# Patient Record
Sex: Female | Born: 1950
Health system: Southern US, Community
[De-identification: ages and names within clinical notes are randomized; demographics above are authoritative.]

## PROBLEM LIST (undated history)

## (undated) DIAGNOSIS — I2510 Atherosclerotic heart disease of native coronary artery without angina pectoris: Secondary | ICD-10-CM

## (undated) DIAGNOSIS — M549 Dorsalgia, unspecified: Secondary | ICD-10-CM

## (undated) DIAGNOSIS — Z8489 Family history of other specified conditions: Secondary | ICD-10-CM

## (undated) DIAGNOSIS — I700 Atherosclerosis of aorta: Secondary | ICD-10-CM

## (undated) DIAGNOSIS — I469 Cardiac arrest, cause unspecified: Secondary | ICD-10-CM

## (undated) DIAGNOSIS — M1990 Unspecified osteoarthritis, unspecified site: Secondary | ICD-10-CM

## (undated) DIAGNOSIS — E785 Hyperlipidemia, unspecified: Secondary | ICD-10-CM

## (undated) DIAGNOSIS — J309 Allergic rhinitis, unspecified: Secondary | ICD-10-CM

## (undated) DIAGNOSIS — I2699 Other pulmonary embolism without acute cor pulmonale: Secondary | ICD-10-CM

## (undated) DIAGNOSIS — Z8619 Personal history of other infectious and parasitic diseases: Secondary | ICD-10-CM

## (undated) DIAGNOSIS — M899 Disorder of bone, unspecified: Secondary | ICD-10-CM

## (undated) DIAGNOSIS — K219 Gastro-esophageal reflux disease without esophagitis: Secondary | ICD-10-CM

## (undated) DIAGNOSIS — R079 Chest pain, unspecified: Secondary | ICD-10-CM

## (undated) DIAGNOSIS — J45901 Unspecified asthma with (acute) exacerbation: Secondary | ICD-10-CM

## (undated) DIAGNOSIS — J45909 Unspecified asthma, uncomplicated: Secondary | ICD-10-CM

## (undated) DIAGNOSIS — I10 Essential (primary) hypertension: Secondary | ICD-10-CM

## (undated) DIAGNOSIS — E1165 Type 2 diabetes mellitus with hyperglycemia: Secondary | ICD-10-CM

## (undated) DIAGNOSIS — M949 Disorder of cartilage, unspecified: Secondary | ICD-10-CM

## (undated) HISTORY — DX: Disorder of cartilage, unspecified: M94.9

## (undated) HISTORY — DX: Atherosclerotic heart disease of native coronary artery without angina pectoris: I25.10

## (undated) HISTORY — DX: Allergic rhinitis, unspecified: J30.9

## (undated) HISTORY — DX: Atherosclerosis of aorta: I70.0

## (undated) HISTORY — DX: Chest pain, unspecified: R07.9

## (undated) HISTORY — DX: Cardiac arrest, cause unspecified: I46.9

## (undated) HISTORY — DX: Unspecified asthma, uncomplicated: J45.909

## (undated) HISTORY — DX: Hyperlipidemia, unspecified: E78.5

## (undated) HISTORY — DX: Dorsalgia, unspecified: M54.9

## (undated) HISTORY — DX: Essential (primary) hypertension: I10

## (undated) HISTORY — DX: Unspecified asthma with (acute) exacerbation: J45.901

## (undated) HISTORY — PX: COLONOSCOPY: SHX174

## (undated) HISTORY — DX: Type 2 diabetes mellitus with hyperglycemia: E11.65

## (undated) HISTORY — DX: Disorder of bone, unspecified: M89.9

## (undated) HISTORY — DX: Other pulmonary embolism without acute cor pulmonale: I26.99

---
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
## 2001-04-26 ENCOUNTER — Encounter: Payer: Self-pay | Admitting: Internal Medicine

## 2001-10-30 ENCOUNTER — Emergency Department (HOSPITAL_COMMUNITY): Admission: EM | Admit: 2001-10-30 | Discharge: 2001-10-30 | Payer: Self-pay

## 2003-08-25 ENCOUNTER — Emergency Department (HOSPITAL_COMMUNITY): Admission: EM | Admit: 2003-08-25 | Discharge: 2003-08-25 | Payer: Self-pay | Admitting: Emergency Medicine

## 2005-04-28 ENCOUNTER — Ambulatory Visit: Payer: Self-pay | Admitting: Family Medicine

## 2005-05-06 ENCOUNTER — Ambulatory Visit: Payer: Self-pay | Admitting: *Deleted

## 2005-05-31 ENCOUNTER — Ambulatory Visit: Payer: Self-pay | Admitting: Family Medicine

## 2005-07-13 ENCOUNTER — Ambulatory Visit: Payer: Self-pay | Admitting: Family Medicine

## 2006-10-04 ENCOUNTER — Ambulatory Visit: Payer: Self-pay | Admitting: Internal Medicine

## 2006-10-04 LAB — CONVERTED CEMR LAB
Alkaline Phosphatase: 87 units/L (ref 39–117)
BUN: 11 mg/dL (ref 6–23)
Basophils Absolute: 0 10*3/uL (ref 0.0–0.1)
CO2: 31 meq/L (ref 19–32)
Calcium: 9.5 mg/dL (ref 8.4–10.5)
Chloride: 103 meq/L (ref 96–112)
Cholesterol: 231 mg/dL (ref 0–200)
Direct LDL: 149.6 mg/dL
Eosinophils Absolute: 0.1 10*3/uL (ref 0.0–0.6)
Eosinophils Relative: 3 % (ref 0.0–5.0)
GFR calc non Af Amer: 79 mL/min
Glucose, Bld: 99 mg/dL (ref 70–99)
HCT: 38.2 % (ref 36.0–46.0)
HDL: 46.5 mg/dL (ref >39.0)
Hemoglobin, Urine: NEGATIVE
Hemoglobin: 12.1 g/dL (ref 12.0–15.0)
Lymphocytes Relative: 41.2 % (ref 12.0–46.0)
MCV: 74.8 fL — ABNORMAL LOW (ref 78.0–100.0)
Neutrophils Relative %: 46.2 % (ref 43.0–77.0)
Potassium: 3.8 meq/L (ref 3.5–5.1)
RDW: 14.8 % — ABNORMAL HIGH (ref 11.5–14.6)
Specific Gravity, Urine: 1.02 (ref 1.000–1.03)
Total CHOL/HDL Ratio: 5
Triglycerides: 117 mg/dL (ref 0–149)
Urobilinogen, UA: 0.2 (ref 0.0–1.0)
VLDL: 23 mg/dL (ref 0–40)
WBC: 3.6 10*3/uL — ABNORMAL LOW (ref 4.5–10.5)
pH: 6.5 (ref 5.0–8.0)

## 2006-10-05 ENCOUNTER — Ambulatory Visit: Payer: Self-pay | Admitting: Internal Medicine

## 2006-10-26 ENCOUNTER — Ambulatory Visit: Payer: Self-pay | Admitting: Internal Medicine

## 2006-11-01 ENCOUNTER — Ambulatory Visit: Payer: Self-pay | Admitting: Gastroenterology

## 2006-11-03 ENCOUNTER — Ambulatory Visit (HOSPITAL_COMMUNITY): Admission: RE | Admit: 2006-11-03 | Discharge: 2006-11-03 | Payer: Self-pay | Admitting: Internal Medicine

## 2006-11-14 ENCOUNTER — Ambulatory Visit: Payer: Self-pay | Admitting: Gastroenterology

## 2007-01-27 ENCOUNTER — Ambulatory Visit: Payer: Self-pay | Admitting: Internal Medicine

## 2007-01-27 LAB — CONVERTED CEMR LAB
ALT: 34 units/L (ref 0–35)
AST: 34 units/L (ref 0–37)
Albumin: 3.9 g/dL (ref 3.5–5.2)
Cholesterol: 159 mg/dL (ref 0–200)
HDL: 49.2 mg/dL (ref >39.0)
LDL Cholesterol: 96 mg/dL (ref 0–99)
Total Bilirubin: 0.4 mg/dL (ref 0.3–1.2)
Total CHOL/HDL Ratio: 3.2

## 2007-01-29 ENCOUNTER — Encounter: Payer: Self-pay | Admitting: Internal Medicine

## 2007-01-29 DIAGNOSIS — E785 Hyperlipidemia, unspecified: Secondary | ICD-10-CM

## 2007-01-29 DIAGNOSIS — J309 Allergic rhinitis, unspecified: Secondary | ICD-10-CM | POA: Insufficient documentation

## 2007-01-29 DIAGNOSIS — I10 Essential (primary) hypertension: Secondary | ICD-10-CM | POA: Insufficient documentation

## 2007-01-29 DIAGNOSIS — J45909 Unspecified asthma, uncomplicated: Secondary | ICD-10-CM

## 2007-01-29 HISTORY — DX: Essential (primary) hypertension: I10

## 2007-01-29 HISTORY — DX: Unspecified asthma, uncomplicated: J45.909

## 2007-01-29 HISTORY — DX: Allergic rhinitis, unspecified: J30.9

## 2007-01-29 HISTORY — DX: Hyperlipidemia, unspecified: E78.5

## 2007-10-16 ENCOUNTER — Ambulatory Visit: Payer: Self-pay | Admitting: Internal Medicine

## 2007-10-16 DIAGNOSIS — M8580 Other specified disorders of bone density and structure, unspecified site: Secondary | ICD-10-CM | POA: Insufficient documentation

## 2007-10-16 DIAGNOSIS — M899 Disorder of bone, unspecified: Secondary | ICD-10-CM

## 2007-10-16 HISTORY — DX: Disorder of bone, unspecified: M89.9

## 2007-10-17 LAB — CONVERTED CEMR LAB
ALT: 34 units/L (ref 0–35)
Basophils Absolute: 0 10*3/uL (ref 0.0–0.1)
Bilirubin, Direct: 0.1 mg/dL (ref 0.0–0.3)
CO2: 29 meq/L (ref 19–32)
Calcium: 9.7 mg/dL (ref 8.4–10.5)
GFR calc Af Amer: 83 mL/min
HDL: 47.5 mg/dL (ref >39.0)
Hemoglobin, Urine: NEGATIVE
LDL Cholesterol: 84 mg/dL (ref 0–99)
Lymphocytes Relative: 36.9 % (ref 12.0–46.0)
MCHC: 32.9 g/dL (ref 30.0–36.0)
Neutro Abs: 2.5 10*3/uL (ref 1.4–7.7)
Neutrophils Relative %: 53.1 % (ref 43.0–77.0)
Platelets: 236 10*3/uL (ref 150–400)
RDW: 15 % — ABNORMAL HIGH (ref 11.5–14.6)
Sodium: 142 meq/L (ref 135–145)
Total Bilirubin: 0.5 mg/dL (ref 0.3–1.2)
Total CHOL/HDL Ratio: 3.2
Triglycerides: 107 mg/dL (ref 0–149)
Urobilinogen, UA: 1 (ref 0.0–1.0)

## 2007-11-27 ENCOUNTER — Ambulatory Visit (HOSPITAL_COMMUNITY): Admission: RE | Admit: 2007-11-27 | Discharge: 2007-11-27 | Payer: Self-pay | Admitting: Internal Medicine

## 2007-11-27 LAB — CONVERTED CEMR LAB: Pap Smear: NORMAL

## 2008-02-27 ENCOUNTER — Encounter: Payer: Self-pay | Admitting: Internal Medicine

## 2008-02-27 ENCOUNTER — Ambulatory Visit: Payer: Self-pay | Admitting: Gynecology

## 2008-02-27 ENCOUNTER — Other Ambulatory Visit: Admission: RE | Admit: 2008-02-27 | Discharge: 2008-02-27 | Payer: Self-pay | Admitting: Gynecology

## 2008-02-27 ENCOUNTER — Encounter: Payer: Self-pay | Admitting: Gynecology

## 2008-06-24 ENCOUNTER — Ambulatory Visit: Payer: Self-pay | Admitting: Internal Medicine

## 2008-06-24 DIAGNOSIS — J45901 Unspecified asthma with (acute) exacerbation: Secondary | ICD-10-CM

## 2008-06-24 DIAGNOSIS — R079 Chest pain, unspecified: Secondary | ICD-10-CM

## 2008-06-24 HISTORY — DX: Unspecified asthma with (acute) exacerbation: J45.901

## 2008-06-24 HISTORY — DX: Chest pain, unspecified: R07.9

## 2008-07-01 ENCOUNTER — Encounter: Payer: Self-pay | Admitting: Internal Medicine

## 2008-07-01 ENCOUNTER — Ambulatory Visit: Payer: Self-pay

## 2008-07-02 ENCOUNTER — Telehealth: Payer: Self-pay | Admitting: Internal Medicine

## 2008-10-23 ENCOUNTER — Ambulatory Visit: Payer: Self-pay | Admitting: Internal Medicine

## 2008-10-24 ENCOUNTER — Encounter: Payer: Self-pay | Admitting: Internal Medicine

## 2008-10-24 ENCOUNTER — Ambulatory Visit: Payer: Self-pay | Admitting: Internal Medicine

## 2008-11-27 ENCOUNTER — Ambulatory Visit (HOSPITAL_COMMUNITY): Admission: RE | Admit: 2008-11-27 | Discharge: 2008-11-27 | Payer: Self-pay | Admitting: Internal Medicine

## 2008-11-27 LAB — HM MAMMOGRAPHY: HM Mammogram: NEGATIVE

## 2009-01-24 ENCOUNTER — Emergency Department (HOSPITAL_COMMUNITY): Admission: EM | Admit: 2009-01-24 | Discharge: 2009-01-24 | Payer: Self-pay | Admitting: Emergency Medicine

## 2009-01-29 ENCOUNTER — Ambulatory Visit: Payer: Self-pay | Admitting: Internal Medicine

## 2009-01-29 DIAGNOSIS — M549 Dorsalgia, unspecified: Secondary | ICD-10-CM

## 2009-01-29 HISTORY — DX: Dorsalgia, unspecified: M54.9

## 2009-01-30 ENCOUNTER — Encounter: Payer: Self-pay | Admitting: Internal Medicine

## 2009-01-30 ENCOUNTER — Telehealth: Payer: Self-pay | Admitting: Internal Medicine

## 2009-01-30 LAB — CONVERTED CEMR LAB
ALT: 51 units/L — ABNORMAL HIGH (ref 0–35)
Albumin: 4.2 g/dL (ref 3.5–5.2)
Basophils Relative: 0.5 % (ref 0.0–3.0)
Bilirubin Urine: NEGATIVE
CO2: 31 meq/L (ref 19–32)
Chloride: 98 meq/L (ref 96–112)
Eosinophils Absolute: 0.1 10*3/uL (ref 0.0–0.7)
HCT: 38.7 % (ref 36.0–46.0)
HDL: 45.9 mg/dL (ref >39.00)
Hemoglobin: 12.2 g/dL (ref 12.0–15.0)
Ketones, ur: NEGATIVE mg/dL
MCHC: 31.5 g/dL (ref 30.0–36.0)
MCV: 77.8 fL — ABNORMAL LOW (ref 78.0–100.0)
Monocytes Absolute: 0.4 10*3/uL (ref 0.1–1.0)
Neutro Abs: 2.7 10*3/uL (ref 1.4–7.7)
Nitrite: NEGATIVE
Potassium: 3.2 meq/L — ABNORMAL LOW (ref 3.5–5.1)
RBC: 4.97 M/uL (ref 3.87–5.11)
Sodium: 140 meq/L (ref 135–145)
Specific Gravity, Urine: 1.015 (ref 1.000–1.030)
Total Protein, Urine: NEGATIVE mg/dL
Total Protein: 7.7 g/dL (ref 6.0–8.3)
Triglycerides: 166 mg/dL — ABNORMAL HIGH (ref 0.0–149.0)
pH: 6 (ref 5.0–8.0)

## 2009-01-31 LAB — CONVERTED CEMR LAB
Hep A IgM: NEGATIVE
Hep B C IgM: NEGATIVE
Hepatitis B Surface Ag: NEGATIVE

## 2009-06-12 ENCOUNTER — Ambulatory Visit: Payer: Self-pay | Admitting: Internal Medicine

## 2009-12-10 ENCOUNTER — Ambulatory Visit: Payer: Self-pay | Admitting: Internal Medicine

## 2009-12-10 LAB — CONVERTED CEMR LAB
ALT: 42 units/L — ABNORMAL HIGH (ref 0–35)
AST: 36 units/L (ref 0–37)
Alkaline Phosphatase: 81 units/L (ref 39–117)
BUN: 18 mg/dL (ref 6–23)
Basophils Absolute: 0 10*3/uL (ref 0.0–0.1)
Bilirubin Urine: NEGATIVE
Bilirubin, Direct: 0.1 mg/dL (ref 0.0–0.3)
Calcium: 9.9 mg/dL (ref 8.4–10.5)
Eosinophils Relative: 2.1 % (ref 0.0–5.0)
GFR calc non Af Amer: 72.28 mL/min (ref >60)
Glucose, Bld: 85 mg/dL (ref 70–99)
HCT: 38 % (ref 36.0–46.0)
Hemoglobin, Urine: NEGATIVE
LDL Cholesterol: 105 mg/dL — ABNORMAL HIGH (ref 0–99)
Lymphocytes Relative: 46.8 % — ABNORMAL HIGH (ref 12.0–46.0)
Monocytes Relative: 8 % (ref 3.0–12.0)
Neutrophils Relative %: 42.5 % — ABNORMAL LOW (ref 43.0–77.0)
Nitrite: NEGATIVE
Platelets: 236 10*3/uL (ref 150.0–400.0)
Potassium: 3.6 meq/L (ref 3.5–5.1)
RDW: 15.6 % — ABNORMAL HIGH (ref 11.5–14.6)
Total Bilirubin: 0.5 mg/dL (ref 0.3–1.2)
Total CHOL/HDL Ratio: 4
Total Protein, Urine: NEGATIVE mg/dL
Urobilinogen, UA: 0.2 (ref 0.0–1.0)
VLDL: 19.4 mg/dL (ref 0.0–40.0)
WBC: 5 10*3/uL (ref 4.5–10.5)

## 2010-05-17 ENCOUNTER — Encounter: Payer: Self-pay | Admitting: Urology

## 2010-05-26 NOTE — Assessment & Plan Note (Signed)
Summary: FU--STC   Vital Signs:  Patient profile:   60 year old female Height:      63 inches Weight:      185.50 pounds BMI:     32.98 O2 Sat:      99 % on Room air Temp:     97.4 degrees F oral Pulse rate:   77 / minute BP sitting:   158 / 90  (left arm) Cuff size:   regular  Vitals Entered ByZella Ball Ewing (June 12, 2009 10:12 AM)  O2 Flow:  Room air CC: followup/RE   CC:  followup/RE.  History of Present Illness: has not been taking the pravachol - tyring to follow a lower chol diet ; recent ldl still elevated; Pt denies CP, sob, doe, wheezing, orthopnea, pnd, worsening LE edema, palps, dizziness or syncope   Pt denies new neuro symptoms such as headache, facial or extremity weakness   Overall good compliance with meds,  tolerating well, no night time sob, wheezing or nighttime awakenings.  No fever, ST, cough, CP, abd pain, diarrhea or blood.    Problems Prior to Update: 1)  Back Pain  (ICD-724.5) 2)  Preventive Health Care  (ICD-V70.0) 3)  Chest Pain  (ICD-786.50) 4)  Asthma, With Acute Exacerbation  (ICD-493.92) 5)  Osteopenia  (ICD-733.90) 6)  Preventive Health Care  (ICD-V70.0) 7)  Asthma  (ICD-493.90) 8)  Hypertension  (ICD-401.9) 9)  Hyperlipidemia  (ICD-272.4) 10)  Allergic Rhinitis  (ICD-477.9)  Medications Prior to Update: 1)  Amlodipine Besy-Benazepril Hcl 5-20 Mg  Caps (Amlodipine Besy-Benazepril Hcl) .... 2 By Mouth Once Daily 2)  Pravachol 40 Mg  Tabs (Pravastatin Sodium) .Marland Kitchen.. 1po Once Daily 3)  Adult Aspirin Ec Low Strength 81 Mg  Tbec (Aspirin) .Marland Kitchen.. 1 By Mouth Once Daily 4)  Hydrochlorothiazide 25 Mg Tabs (Hydrochlorothiazide) .... 1/2 By Mouth Once Daily 5)  Proair Hfa 108 (90 Base) Mcg/act Aers (Albuterol Sulfate) .... 2 Puffs Four Times Per Day As Needed 6)  Naprosyn 500 Mg Tabs (Naproxen) .Marland Kitchen.. 1 By Mouth Two Times A Day As Needed 7)  Flexeril 5 Mg Tabs (Cyclobenzaprine Hcl) .Marland Kitchen.. 1 By Mouth Three Times A Day As Needed 8)  Klor-Con 10 10 Meq  Cr-Tabs (Potassium Chloride) .Marland Kitchen.. 1 By Mouth Once Daily  Current Medications (verified): 1)  Amlodipine Besy-Benazepril Hcl 5-20 Mg  Caps (Amlodipine Besy-Benazepril Hcl) .... 2 By Mouth Once Daily 2)  Adult Aspirin Ec Low Strength 81 Mg  Tbec (Aspirin) .Marland Kitchen.. 1 By Mouth Once Daily 3)  Hydrochlorothiazide 25 Mg Tabs (Hydrochlorothiazide) .Marland Kitchen.. 1 By Mouth Once Daily 4)  Proair Hfa 108 (90 Base) Mcg/act Aers (Albuterol Sulfate) .... 2 Puffs Four Times Per Day As Needed 5)  Naprosyn 500 Mg Tabs (Naproxen) .Marland Kitchen.. 1 By Mouth Two Times A Day As Needed 6)  Flexeril 5 Mg Tabs (Cyclobenzaprine Hcl) .Marland Kitchen.. 1 By Mouth Three Times A Day As Needed 7)  Klor-Con 10 10 Meq Cr-Tabs (Potassium Chloride) .Marland Kitchen.. 1 By Mouth Once Daily 8)  Lovastatin 40 Mg Tabs (Lovastatin) .Marland Kitchen.. 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   Head:  normocephalic and atraumatic.   Eyes:  vision grossly intact, pupils equal, and pupils round.   Ears:  R ear normal and L ear normal.   Nose:  no external deformity and no nasal discharge.   Mouth:  no gingival abnormalities and pharynx pink and moist.   Neck:  supple and no masses.   Lungs:  normal respiratory effort and normal breath sounds.   Heart:  normal rate and regular rhythm.   Abdomen:  soft, non-tender, and normal bowel sounds.   Extremities:  no edema, no erythema    Impression & Recommendations:  Problem # 1:  HYPERTENSION (ICD-401.9)  Her updated  medication list for this problem includes:    Amlodipine Besy-benazepril Hcl 5-20 Mg Caps (Amlodipine besy-benazepril hcl) .Marland Kitchen... 2 by mouth once daily    Hydrochlorothiazide 25 Mg Tabs (Hydrochlorothiazide) .Marland Kitchen... 1 by mouth once daily  BP today: 158/90 Prior BP: 140/80 (01/29/2009)  Labs Reviewed: K+: 3.2 (01/29/2009) Creat: : 1.0 (01/29/2009)   Chol: 209 (01/29/2009)   HDL: 45.90 (01/29/2009)   LDL: 84 (10/16/2007)   TG: 166.0 (01/29/2009) mild uncontrolled, to increase the hctz to 1 per day  Problem # 2:  HYPERLIPIDEMIA (ICD-272.4)  The following medications were removed from the medication list:    Pravachol 40 Mg Tabs (Pravastatin sodium) .Marland Kitchen... 1po once daily Her updated medication list for this problem includes:    Lovastatin 40 Mg Tabs (Lovastatin) .Marland Kitchen... 1po once daily  Labs Reviewed: SGOT: 45 (01/29/2009)   SGPT: 51 (01/29/2009)   HDL:45.90 (01/29/2009), 47.5 (10/16/2007)  LDL:84 (10/16/2007), 96 (01/27/2007)  Chol:209 (01/29/2009), 153 (10/16/2007)  Trig:166.0 (01/29/2009), 107 (10/16/2007) start the lovastatin 40 mg per day, stop the pravachol as she is no longer taking  Problem # 3:  ASTHMA (ICD-493.90)  Her updated medication list for this problem includes:    Proair Hfa 108 (90 Base) Mcg/act Aers (Albuterol sulfate) .Marland Kitchen... 2 puffs four times per day as needed stable overall by hx and exam, ok to continue meds/tx as is , f/u any worsening s/s  Complete Medication List: 1)  Amlodipine Besy-benazepril Hcl 5-20 Mg Caps (Amlodipine besy-benazepril hcl) .... 2 by mouth once daily 2)  Adult Aspirin Ec Low Strength 81 Mg Tbec (Aspirin) .Marland Kitchen.. 1 by mouth once daily 3)  Hydrochlorothiazide 25 Mg Tabs (Hydrochlorothiazide) .Marland Kitchen.. 1 by mouth once daily 4)  Proair Hfa 108 (90 Base) Mcg/act Aers (Albuterol sulfate) .... 2 puffs four times per day as needed 5)  Naprosyn 500 Mg Tabs (Naproxen) .Marland Kitchen.. 1 by mouth two times a day as needed 6)  Flexeril 5 Mg Tabs (Cyclobenzaprine hcl) .Marland Kitchen.. 1 by  mouth three times a day as needed 7)  Klor-con 10 10 Meq Cr-tabs (Potassium chloride) .Marland Kitchen.. 1 by mouth once daily 8)  Lovastatin 40 Mg Tabs (Lovastatin) .Marland Kitchen.. 1po once daily  Patient Instructions: 1)  increase the fluid pill to one per day 2)  start lovastatin 40 mg per day 3)  Continue all previous medications as before this visit 4)  Please schedule a follow-up appointment in 6 months with CPX labs  Prescriptions: LOVASTATIN 40 MG TABS (LOVASTATIN) 1po once daily  #90 x 3   Entered and Authorized by:   Corwin Levins MD   Signed by:   Corwin Levins MD on 06/12/2009   Method used:   Electronically to        [facility] (retail)  [address]       [place], [place]  [address]       Ph: [phone]       Fax: [phone]   RxID:   [id number] HYDROCHLOROTHIAZIDE 25 MG TABS (HYDROCHLOROTHIAZIDE) 1 by mouth once daily  #100 x 3   Entered and Authorized by:   Corwin Levins MD   Signed by:   Corwin Levins MD on 06/12/2009   Method used:   Electronically to        [facility] (retail)       [address]       [place], [place]  [address]       Ph: [phone]       Fax: [phone]   RxID:   [id number]

## 2010-05-26 NOTE — Assessment & Plan Note (Signed)
Summary: 6 mth fu--stc   Vital Signs:  Patient profile:   60 year old female Height:      63 inches Weight:      181 pounds BMI:     32.18 O2 Sat:      98 % on Room air Temp:     98.7 degrees F oral Pulse rate:   91 / minute BP sitting:   130 / 88  (left arm) Cuff size:   regular  Vitals Entered By: Zella Ball Ewing CMA Duncan Dull) (December 10, 2009 10:13 AM)  O2 Flow:  Room air  Preventive Care Screening  Bone Density:    Date:  10/24/2008    Next Due:  10/2010    Results:  abnormal std dev  CC: 6 month followup/RE   CC:  6 month followup/RE.  History of Present Illness: here to f/u - overall lost  2 lbs recently with  better diet, and more active, trying to follow lower chol diet;  Pt denies CP, sob, doe, wheezing, orthopnea, pnd, worsening LE edema, palps, dizziness or syncope  Pt denies new neuro symptoms such as headache, facial or extremity weakness  Pt denies polydipsia, polyuria,   Overall good compliance with meds, trying to follow low chol, DM diet, wt stable, little excercise however .  No fever, wt loss, night sweats, loss of appetite or other constitutional symptoms   Preventive Screening-Counseling & Management      Drug Use:  no.    Problems Prior to Update: 1)  Back Pain  (ICD-724.5) 2)  Preventive Health Care  (ICD-V70.0) 3)  Chest Pain  (ICD-786.50) 4)  Asthma, With Acute Exacerbation  (ICD-493.92) 5)  Osteopenia  (ICD-733.90) 6)  Preventive Health Care  (ICD-V70.0) 7)  Asthma  (ICD-493.90) 8)  Hypertension  (ICD-401.9) 9)  Hyperlipidemia  (ICD-272.4) 10)  Allergic Rhinitis  (ICD-477.9)  Medications Prior to Update: 1)  Amlodipine Besy-Benazepril Hcl 5-20 Mg  Caps (Amlodipine Besy-Benazepril Hcl) .... 2 By Mouth Once Daily 2)  Adult Aspirin Ec Low Strength 81 Mg  Tbec (Aspirin) .Marland Kitchen.. 1 By Mouth Once Daily 3)  Hydrochlorothiazide 25 Mg Tabs (Hydrochlorothiazide) .Marland Kitchen.. 1 By Mouth Once Daily 4)  Proair Hfa 108 (90 Base) Mcg/act Aers (Albuterol Sulfate) .... 2  Puffs Four Times Per Day As Needed 5)  Naprosyn 500 Mg Tabs (Naproxen) .Marland Kitchen.. 1 By Mouth Two Times A Day As Needed 6)  Flexeril 5 Mg Tabs (Cyclobenzaprine Hcl) .Marland Kitchen.. 1 By Mouth Three Times A Day As Needed 7)  Klor-Con 10 10 Meq Cr-Tabs (Potassium Chloride) .Marland Kitchen.. 1 By Mouth Once Daily 8)  Lovastatin 40 Mg Tabs (Lovastatin) .Marland Kitchen.. 1po Once Daily  Current Medications (verified): 1)  Amlodipine Besy-Benazepril Hcl 5-20 Mg  Caps (Amlodipine Besy-Benazepril Hcl) .... 2 By Mouth Once Daily 2)  Adult Aspirin Ec Low Strength 81 Mg  Tbec (Aspirin) .Marland Kitchen.. 1 By Mouth Once Daily 3)  Hydrochlorothiazide 25 Mg Tabs (Hydrochlorothiazide) .Marland Kitchen.. 1 By Mouth Once Daily 4)  Proair Hfa 108 (90 Base) Mcg/act Aers (Albuterol Sulfate) .... 2 Puffs Four Times Per Day As Needed 5)  Klor-Con 10 10 Meq Cr-Tabs (Potassium Chloride) .Marland Kitchen.. 1 By Mouth Once Daily 6)  Lovastatin 40 Mg Tabs (Lovastatin) .Marland Kitchen.. 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         all otherwise negative per pt -    Physical Exam  General:  alert and overweight-appearing.   Head:  normocephalic and atraumatic.   Eyes:  vision grossly intact, pupils equal, and pupils round.   Ears:  R ear normal and L ear normal.   Nose:  no external deformity and no nasal discharge.   Mouth:  no gingival abnormalities and pharynx pink and moist.   Neck:  supple and no masses.   Lungs:  normal respiratory effort and normal breath sounds.   Heart:  normal rate and regular rhythm.   Abdomen:  soft, non-tender, and normal bowel sounds.   Msk:  no joint tenderness and no joint swelling.   Extremities:  no edema, no erythema  Neurologic:  cranial nerves II-XII intact and strength normal in all extremities.   Skin:  color normal and no rashes.   Psych:  memory intact for recent and remote and normally interactive.     Impression & Recommendations:  Problem # 1:  Preventive Health Care (ICD-V70.0)  Overall doing well, age appropriate education and counseling updated and referral for appropriate preventive services done unless declined, immunizations up to date or declined, diet counseling done if overweight, urged to quit smoking if smokes , most recent labs reviewed and current ordered if appropriate, ecg reviewed or declined (interpretation per ECG scanned in the EMR if done); information regarding Medicare Prevention requirements given if appropriate; speciality referrals updated as appropriate   Orders: TLB-BMP (Basic Metabolic Panel-BMET) (80048-METABOL) TLB-CBC Platelet - w/Differential (85025-CBCD) TLB-Hepatic/Liver Function Pnl (80076-HEPATIC) TLB-Lipid Panel (80061-LIPID) TLB-TSH (Thyroid Stimulating Hormone) (84443-TSH) TLB-Udip ONLY (81003-UDIP)  Problem # 2:  HYPERTENSION (ICD-401.9)  Her updated medication list for this problem  includes:    Amlodipine Besy-benazepril Hcl 5-20 Mg Caps (Amlodipine besy-benazepril hcl) .Marland Kitchen... 2 by mouth once daily    Hydrochlorothiazide 25 Mg Tabs (Hydrochlorothiazide) .Marland Kitchen... 1 by mouth once daily  BP today: 130/88 Prior BP: 158/90 (06/12/2009)  Labs Reviewed: K+: 3.2 (01/29/2009) Creat: : 1.0 (01/29/2009)   Chol: 209 (01/29/2009)   HDL: 45.90 (01/29/2009)   LDL: 84 (10/16/2007)   TG: 166.0 (01/29/2009) stable overall by hx and exam, ok to continue meds/tx as is   Complete Medication List: 1)  Amlodipine Besy-benazepril Hcl 5-20 Mg Caps (Amlodipine besy-benazepril hcl) .... 2 by mouth once daily 2)  Adult Aspirin Ec Low Strength 81 Mg Tbec (Aspirin) .Marland Kitchen.. 1 by mouth once daily 3)  Hydrochlorothiazide 25 Mg Tabs (Hydrochlorothiazide) .Marland Kitchen.. 1 by mouth once daily 4)  Proair Hfa 108 (90 Base) Mcg/act Aers (Albuterol sulfate) .... 2 puffs four times per day as needed 5)  Klor-con 10 10 Meq Cr-tabs (Potassium chloride) .Marland Kitchen.. 1 by mouth once daily 6)  Lovastatin 40 Mg Tabs (Lovastatin) .Marland Kitchen.. 1po once daily  Patient Instructions: 1)  Continue all previous medications as before this  visit  2)  Please go to the Lab in the basement for your blood and/or urine tests today 3)  Please call the number on the [facility] Card for results of your testing  4)  Please schedule a follow-up appointment in 1 year or sooner if needed Prescriptions: LOVASTATIN 40 MG TABS (LOVASTATIN) 1po once daily  #90 x 3   Entered and Authorized by:   Corwin Levins MD   Signed by:   Corwin Levins MD on 12/10/2009   Method used:   Print then Give to Patient   RxID:   [id number] KLOR-CON 10 10 MEQ CR-TABS (POTASSIUM CHLORIDE) 1 by mouth once daily  #90 x 3   Entered and Authorized by:   Corwin Levins MD   Signed by:   Corwin Levins MD on 12/10/2009   Method used:   Print then Give to Patient   RxID:   [id number] PROAIR HFA 108 (90 BASE) MCG/ACT AERS (ALBUTEROL SULFATE) 2 puffs four times per day as needed  #3 x 3    Entered and Authorized by:   Corwin Levins MD   Signed by:   Corwin Levins MD on 12/10/2009   Method used:   Print then Give to Patient   RxID:   [id number] HYDROCHLOROTHIAZIDE 25 MG TABS (HYDROCHLOROTHIAZIDE) 1 by mouth once daily  #90 x 3   Entered and Authorized by:   Corwin Levins MD   Signed by:   Corwin Levins MD on 12/10/2009   Method used:   Print then Give to Patient   RxID:   [id number] AMLODIPINE BESY-BENAZEPRIL HCL 5-20 MG  CAPS (AMLODIPINE BESY-BENAZEPRIL HCL) 2 by mouth once daily  #180 x 3   Entered and Authorized by:   Corwin Levins MD   Signed by:   Corwin Levins MD on 12/10/2009   Method used:   Print then Give to Patient   RxID:   [id number]

## 2011-02-25 ENCOUNTER — Encounter: Payer: Self-pay | Admitting: Internal Medicine

## 2011-02-26 ENCOUNTER — Encounter: Payer: Self-pay | Admitting: Internal Medicine

## 2011-02-26 DIAGNOSIS — Z0001 Encounter for general adult medical examination with abnormal findings: Secondary | ICD-10-CM | POA: Insufficient documentation

## 2011-03-02 ENCOUNTER — Ambulatory Visit (INDEPENDENT_AMBULATORY_CARE_PROVIDER_SITE_OTHER): Payer: BC Managed Care – PPO | Admitting: Internal Medicine

## 2011-03-02 ENCOUNTER — Encounter: Payer: Self-pay | Admitting: Internal Medicine

## 2011-03-02 ENCOUNTER — Other Ambulatory Visit (INDEPENDENT_AMBULATORY_CARE_PROVIDER_SITE_OTHER): Payer: BC Managed Care – PPO

## 2011-03-02 ENCOUNTER — Other Ambulatory Visit: Payer: Self-pay | Admitting: Internal Medicine

## 2011-03-02 VITALS — BP 130/82 | HR 88 | Temp 98.6°F | Ht 63.0 in | Wt 187.4 lb

## 2011-03-02 DIAGNOSIS — Z23 Encounter for immunization: Secondary | ICD-10-CM

## 2011-03-02 DIAGNOSIS — Z0000 Encounter for general adult medical examination without abnormal findings: Secondary | ICD-10-CM

## 2011-03-02 LAB — CBC WITH DIFFERENTIAL/PLATELET
Basophils Absolute: 0 10*3/uL (ref 0.0–0.1)
Basophils Relative: 0.4 % (ref 0.0–3.0)
Eosinophils Absolute: 0.1 10*3/uL (ref 0.0–0.7)
Lymphocytes Relative: 37.9 % (ref 12.0–46.0)
MCHC: 31.8 g/dL (ref 30.0–36.0)
MCV: 76.8 fl — ABNORMAL LOW (ref 78.0–100.0)
Monocytes Absolute: 0.4 10*3/uL (ref 0.1–1.0)
Neutrophils Relative %: 53.4 % (ref 43.0–77.0)
Platelets: 269 10*3/uL (ref 150.0–400.0)
RBC: 5.26 Mil/uL — ABNORMAL HIGH (ref 3.87–5.11)
RDW: 15.4 % — ABNORMAL HIGH (ref 11.5–14.6)

## 2011-03-02 LAB — HEPATIC FUNCTION PANEL
ALT: 39 U/L — ABNORMAL HIGH (ref 0–35)
AST: 37 U/L (ref 0–37)
Alkaline Phosphatase: 87 U/L (ref 39–117)
Bilirubin, Direct: 0 mg/dL (ref 0.0–0.3)
Total Bilirubin: 0.4 mg/dL (ref 0.3–1.2)
Total Protein: 8.1 g/dL (ref 6.0–8.3)

## 2011-03-02 LAB — BASIC METABOLIC PANEL
CO2: 33 mEq/L — ABNORMAL HIGH (ref 19–32)
Chloride: 100 mEq/L (ref 96–112)
Creatinine, Ser: 1 mg/dL (ref 0.4–1.2)
Potassium: 2.9 mEq/L — ABNORMAL LOW (ref 3.5–5.1)
Sodium: 142 mEq/L (ref 135–145)

## 2011-03-02 LAB — LIPID PANEL
LDL Cholesterol: 111 mg/dL — ABNORMAL HIGH (ref 0–99)
Total CHOL/HDL Ratio: 3
Triglycerides: 93 mg/dL (ref 0.0–149.0)

## 2011-03-02 LAB — URINALYSIS, ROUTINE W REFLEX MICROSCOPIC
Bilirubin Urine: NEGATIVE
Hgb urine dipstick: NEGATIVE
Nitrite: NEGATIVE
Total Protein, Urine: NEGATIVE
Urobilinogen, UA: 0.2 (ref 0.0–1.0)

## 2011-03-02 MED ORDER — HYDROCHLOROTHIAZIDE 25 MG PO TABS: 25.0000 mg | ORAL_TABLET | Freq: Every day | ORAL | Status: DC

## 2011-03-02 MED ORDER — POTASSIUM CHLORIDE 10 MEQ PO TBCR: 20.0000 meq | EXTENDED_RELEASE_TABLET | Freq: Every day | ORAL | Status: DC

## 2011-03-02 MED ORDER — POTASSIUM CHLORIDE 10 MEQ PO TBCR: 10.0000 meq | EXTENDED_RELEASE_TABLET | Freq: Every day | ORAL | Status: DC

## 2011-03-02 MED ORDER — LOVASTATIN 40 MG PO TABS: 40.0000 mg | ORAL_TABLET | Freq: Every day | ORAL | Status: DC

## 2011-03-02 MED ORDER — ALBUTEROL SULFATE HFA 108 (90 BASE) MCG/ACT IN AERS: 2.0000 | INHALATION_SPRAY | Freq: Four times a day (QID) | RESPIRATORY_TRACT | Status: DC | PRN

## 2011-03-02 MED ORDER — AMLODIPINE BESY-BENAZEPRIL HCL 5-20 MG PO CAPS: 2.0000 | ORAL_CAPSULE | Freq: Every day | ORAL | Status: DC

## 2011-03-02 NOTE — Patient Instructions (Addendum)
You had the flu shot today Please remember to followup with your GYN for the yearly pap smear and/or mammogram Take all new medications as prescribed - the Ventolin HFA inhaler (and call if too expensive with your insurance, as the other one called Proventil HFA may be less expensive) Continue all other medications as before Please go to LAB in the Basement for the blood and/or urine tests to be done today Please call the phone number [phone] (the [employer] System) for results of testing in 2-3 days;  When calling, simply dial the number, and when prompted enter the MRN number above (the Medical Record Number) and the # key, then the message should start. All of your prescription refills were sent to the pharmacy on the computer Please return in 1 year for your yearly visit, or sooner if needed, with Lab testing done 3-5 days before

## 2011-03-02 NOTE — Telephone Encounter (Signed)
Done hardcopy to robin  

## 2011-03-03 ENCOUNTER — Telehealth: Payer: Self-pay | Admitting: *Deleted

## 2011-03-03 NOTE — Telephone Encounter (Signed)
Pt requesting call back concerning medication management question.

## 2011-03-03 NOTE — Telephone Encounter (Signed)
Patient informed of lab results and medication change.

## 2011-03-07 ENCOUNTER — Encounter: Payer: Self-pay | Admitting: Internal Medicine

## 2011-03-07 NOTE — Assessment & Plan Note (Signed)

## 2011-03-07 NOTE — Progress Notes (Signed)
Subjective:    Patient ID: Deborah Neal, female    DOB: 11/11/1950, 60 y.o.   MRN: [id number]  HPI  Here for wellness and f/u;  Overall doing ok;  Pt denies CP, worsening SOB, DOE, wheezing, orthopnea, PND, worsening LE edema, palpitations, dizziness or syncope.  Pt denies neurological change such as new Headache, facial or extremity weakness.  Pt denies polydipsia, polyuria, or low sugar symptoms. Pt states overall good compliance with treatment and medications, good tolerability, and trying to follow lower cholesterol diet.  Pt denies worsening depressive symptoms, suicidal ideation or panic. No fever, wt loss, night sweats, loss of appetite, or other constitutional symptoms.  Pt states good ability with ADL's, low fall risk, home safety reviewed and adequate, no significant changes in hearing or vision, and occasionally active with exercise. Did have recent URI symptoms last wk, now resolved. Past Medical History  Diagnosis Date  . ALLERGIC RHINITIS 01/29/2007  . ASTHMA, WITH ACUTE EXACERBATION 06/24/2008  . ASTHMA 01/29/2007  . BACK PAIN 01/29/2009  . CHEST PAIN 06/24/2008  . HYPERLIPIDEMIA 01/29/2007  . HYPERTENSION 01/29/2007  . OSTEOPENIA 10/16/2007   No past surgical history on file.  reports that she has quit smoking. She does not have any smokeless tobacco history on file. She reports that she does not drink alcohol or use illicit drugs. family history is not on file. No Known Allergies Current Outpatient Prescriptions on File Prior to Visit  Medication Sig Dispense Refill  . aspirin 81 MG tablet Take 81 mg by mouth daily.         Review of Systems Review of Systems  Constitutional: Negative for diaphoresis, activity change, appetite change and unexpected weight change.  HENT: Negative for hearing loss, ear pain, facial swelling, mouth sores and neck stiffness.   Eyes: Negative for pain, redness and visual disturbance.  Respiratory: Negative for shortness of breath and wheezing.     Cardiovascular: Negative for chest pain and palpitations.  Gastrointestinal: Negative for diarrhea, blood in stool, abdominal distention and rectal pain.  Genitourinary: Negative for hematuria, flank pain and decreased urine volume.  Musculoskeletal: Negative for myalgias and joint swelling.  Skin: Negative for color change and wound.  Neurological: Negative for syncope and numbness.  Hematological: Negative for adenopathy.  Psychiatric/Behavioral: Negative for hallucinations, self-injury, decreased concentration and agitation.      Objective:   Physical Exam BP 130/82  Pulse 88  Temp(Src) 98.6 F (37 C) (Oral)  Ht 5\' 3"  (1.6 m)  Wt 187 lb 6 oz (84.993 kg)  BMI 33.19 kg/m2  SpO2 96% Physical Exam  VS noted Constitutional: Pt is oriented to person, place, and time. Appears well-developed and well-nourished.  HENT:  Head: Normocephalic and atraumatic.  Right Ear: External ear normal.  Left Ear: External ear normal.  Nose: Nose normal.  Mouth/Throat: Oropharynx is clear and moist.  Eyes: Conjunctivae and EOM are normal. Pupils are equal, round, and reactive to light.  Neck: Normal range of motion. Neck supple. No JVD present. No tracheal deviation present.  Cardiovascular: Normal rate, regular rhythm, normal heart sounds and intact distal pulses.   Pulmonary/Chest: Effort normal and breath sounds normal.  Abdominal: Soft. Bowel sounds are normal. There is no tenderness.  Musculoskeletal: Normal range of motion. Exhibits no edema.  Lymphadenopathy:  Has no cervical adenopathy.  Neurological: Pt is alert and oriented to person, place, and time. Pt has normal reflexes. No cranial nerve deficit.  Skin: Skin is warm and dry. No rash noted.  Psychiatric:  Has  normal mood and affect. Behavior is normal.     Assessment & Plan:

## 2011-06-08 ENCOUNTER — Encounter: Payer: Self-pay | Admitting: Internal Medicine

## 2011-06-08 ENCOUNTER — Ambulatory Visit (INDEPENDENT_AMBULATORY_CARE_PROVIDER_SITE_OTHER): Payer: BC Managed Care – PPO | Admitting: Internal Medicine

## 2011-06-08 ENCOUNTER — Other Ambulatory Visit (INDEPENDENT_AMBULATORY_CARE_PROVIDER_SITE_OTHER): Payer: BC Managed Care – PPO

## 2011-06-08 VITALS — BP 140/82 | HR 87 | Temp 98.6°F | Ht 63.0 in | Wt 188.2 lb

## 2011-06-08 DIAGNOSIS — I10 Essential (primary) hypertension: Secondary | ICD-10-CM

## 2011-06-08 DIAGNOSIS — E876 Hypokalemia: Secondary | ICD-10-CM

## 2011-06-08 DIAGNOSIS — R202 Paresthesia of skin: Secondary | ICD-10-CM | POA: Insufficient documentation

## 2011-06-08 DIAGNOSIS — R209 Unspecified disturbances of skin sensation: Secondary | ICD-10-CM

## 2011-06-08 DIAGNOSIS — M722 Plantar fascial fibromatosis: Secondary | ICD-10-CM

## 2011-06-08 LAB — BASIC METABOLIC PANEL
BUN: 24 mg/dL — ABNORMAL HIGH (ref 6–23)
CO2: 28 mEq/L (ref 19–32)
Chloride: 104 mEq/L (ref 96–112)
Creatinine, Ser: 1 mg/dL (ref 0.4–1.2)
Glucose, Bld: 105 mg/dL — ABNORMAL HIGH (ref 70–99)
Potassium: 3.3 mEq/L — ABNORMAL LOW (ref 3.5–5.1)

## 2011-06-08 MED ORDER — NAPROXEN 500 MG PO TABS: 500.0000 mg | ORAL_TABLET | Freq: Two times a day (BID) | ORAL | Status: DC

## 2011-06-08 NOTE — Progress Notes (Signed)
Subjective:    Patient ID: Deborah Neal, female    DOB: 01/25/1951, 61 y.o.   MRN: [id number]  HPI  Here with bilat distal UE tingling and numbness left > right, mild to mod, recurrent, radiats somewhat to the left elbow, but no pain or weakness, neck pain or other orthopedic.  Pt denies chest pain, increased sob or doe, wheezing, orthopnea, PND, increased LE swelling, palpitations, dizziness or syncope.  Pt denies new neurological symptoms such as new headache, or facial or extremity weakness or numbness   Pt denies polydipsia, polyuria, Pt states overall good compliance with meds, trying to follow lower cholesterol diet, wt overall stable but little exercise however.   Did have low K last visit and due for f/u. 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   Pt denies fever, wt loss, night sweats, loss of appetite, or other constitutional symptoms  Overall good compliance with treatment, and good medicine tolerability.   Past Medical History  Diagnosis Date  . ALLERGIC RHINITIS 01/29/2007  . ASTHMA, WITH ACUTE EXACERBATION 06/24/2008  . ASTHMA 01/29/2007  . BACK PAIN 01/29/2009  . CHEST PAIN 06/24/2008  . HYPERLIPIDEMIA 01/29/2007  . HYPERTENSION 01/29/2007  . OSTEOPENIA 10/16/2007   No past surgical history on file.  reports that she has quit smoking. She does not have any smokeless tobacco history on file. She reports that she does not drink alcohol or use illicit drugs. family history is not on file. No Known Allergies Current Outpatient Prescriptions on File Prior to Visit  Medication Sig Dispense Refill  . albuterol (PROVENTIL HFA;VENTOLIN HFA) 108 (90 BASE) MCG/ACT inhaler Inhale 2 puffs into the lungs every 6 (six) hours as needed for wheezing.  1 Inhaler  11  . amLODipine-benazepril (LOTREL) 5-20 MG per capsule Take 2 capsules by mouth daily.  180 capsule  3    . aspirin 81 MG tablet Take 81 mg by mouth daily.        . hydrochlorothiazide (HYDRODIURIL) 25 MG tablet Take 1 tablet (25 mg total) by mouth daily.  90 tablet  3  . lovastatin (MEVACOR) 40 MG tablet Take 1 tablet (40 mg total) by mouth at bedtime.  90 tablet  3  . potassium chloride (KLOR-CON) 10 MEQ CR tablet Take 2 tablets (20 mEq total) by mouth daily.  180 tablet  3   Review of Systems Review of Systems  Constitutional: Negative for diaphoresis and unexpected weight change.  HENT: Negative for drooling and tinnitus.   Eyes: Negative for photophobia and visual disturbance.  Respiratory: Negative for choking and stridor.   Gastrointestinal: Negative for vomiting and blood in stool.  Genitourinary: Negative for hematuria and decreased urine volume.      Objective:   Physical Exam BP 140/82  Pulse 87  Temp(Src) 98.6 F (37 C) (Oral)  Ht 5\' 3"  (1.6 m)  Wt 188 lb 4 oz (85.39 kg)  BMI 33.35 kg/m2  SpO2 94% Physical Exam  VS noted Constitutional: Pt appears well-developed and well-nourished.  HENT: Head: Normocephalic.  Right Ear: External ear normal.  Left Ear: External ear normal.  Eyes: Conjunctivae and EOM are normal. Pupils are equal, round, and reactive to light.  Neck: Normal range of motion. Neck supple.  Cardiovascular: Normal rate and regular rhythm.   Pulmonary/Chest: Effort normal and breath sounds normal.  Abd:  Soft, NT, non-distended, + BS Neurological:  Pt is alert. No cranial nerve deficit. UE with motor/sens/dtr intact Mild plantar tender bilat Skin: Skin is warm. No erythema.  Psychiatric: Pt behavior is normal. Thought content normal.     Assessment & Plan:

## 2011-06-08 NOTE — Assessment & Plan Note (Signed)
stable overall by hx and exam, most recent data reviewed with pt, and pt to continue medical treatment as before  BP Readings from Last 3 Encounters:  06/08/11 140/82  03/02/11 130/82  12/10/09 130/88

## 2011-06-08 NOTE — Assessment & Plan Note (Signed)
Due for f/u K level, Continue all other medications as before,  to f/u any worsening symptoms or concerns

## 2011-06-08 NOTE — Assessment & Plan Note (Addendum)
Exam benign, hx c/w prob mild CTS;  For bilat wrsit splint, nsaid prn,  to f/u any worsening symptoms or concerns, consider NCX/emg, also check b12 today

## 2011-06-08 NOTE — Assessment & Plan Note (Signed)
Mild, for nsaid prn, consider podiatry, d/w pt use of shoe inserts

## 2011-06-08 NOTE — Patient Instructions (Addendum)
Take all new medications as prescribed - the anti-inflammatory as needed for pain and numbness Continue all other medications as before You are given the left and right wrist splints to wear at night only to take pressure off the nerves in the wrists (you can hold off on using if the numbness gets better, and re-start if gets worse again) Please go to LAB in the Basement for the blood and/or urine tests to be done today - the vitamin B12, as well as the potassium level Please call the phone number [phone] (the [employer] System) for results of testing in 2-3 days;  When calling, simply dial the number, and when prompted enter the MRN number above (the Medical Record Number) and the # key, then the message should start.

## 2011-09-03 ENCOUNTER — Other Ambulatory Visit: Payer: Self-pay | Admitting: Internal Medicine

## 2011-09-03 DIAGNOSIS — Z1231 Encounter for screening mammogram for malignant neoplasm of breast: Secondary | ICD-10-CM

## 2011-09-07 ENCOUNTER — Other Ambulatory Visit (HOSPITAL_COMMUNITY)
Admission: RE | Admit: 2011-09-07 | Discharge: 2011-09-07 | Disposition: A | Discharge status: Home / Self Care | Payer: BC Managed Care – PPO | Source: Ambulatory Visit | Attending: Gynecology | Admitting: Gynecology

## 2011-09-07 ENCOUNTER — Ambulatory Visit (INDEPENDENT_AMBULATORY_CARE_PROVIDER_SITE_OTHER): Payer: BC Managed Care – PPO | Admitting: Gynecology

## 2011-09-07 ENCOUNTER — Encounter: Payer: Self-pay | Admitting: Gynecology

## 2011-09-07 VITALS — BP 130/82 | Ht 62.0 in | Wt 183.0 lb

## 2011-09-07 DIAGNOSIS — Z01419 Encounter for gynecological examination (general) (routine) without abnormal findings: Secondary | ICD-10-CM | POA: Insufficient documentation

## 2011-09-07 DIAGNOSIS — N952 Postmenopausal atrophic vaginitis: Secondary | ICD-10-CM

## 2011-09-07 MED ORDER — NONFORMULARY OR COMPOUNDED ITEM: Status: DC

## 2011-09-07 NOTE — Patient Instructions (Signed)
Bone Densitometry Bone densitometry is a special X-ray that measures your bone density and can be used to help predict your risk of bone fractures. This test is used to determine bone mineral content and density to diagnose osteoporosis. Osteoporosis is the loss of bone that may cause the bone to become weak. Osteoporosis commonly occurs in women entering menopause. However, it may be found in men and in people with other diseases. PREPARATION FOR TEST No preparation necessary. WHO SHOULD BE TESTED?  All women older than 65.   Postmenopausal women (50 to 65) with risk factors for osteoporosis.   People with a previous fracture caused by normal activities.   People with a small body frame (less than 127 poundsor a body mass index [BMI] of less than 21).   People who have a parent with a hip fracture or history of osteoporosis.   People who smoke.   People who have rheumatoid arthritis.   Anyone who engages in excessive alcohol use (more than 3 drinks most days).   Women who experience early menopause.  WHEN SHOULD YOU BE RETESTED? Current guidelines suggest that you should wait at least 2 years before doing a bone density test again if your first test was normal.Recent studies indicated that women with normal bone density may be able to wait a few years before needing to repeat a bone density test. You should discuss this with your caregiver.  NORMAL FINDINGS   Normal: less than standard deviation below normal (greater than -1).   Osteopenia: 1 to 2.5 standard deviations below normal (-1 to -2.5).   Osteoporosis: greater than 2.5 standard deviations below normal (less than -2.5).  Test results are reported as a "T score" and a "Z score."The T score is a number that compares your bone density with the bone density of healthy, young women.The Z score is a number that compares your bone density with the scores of women who are the same age, gender, and race.  Ranges for normal  findings may vary among different laboratories and hospitals. You should always check with your doctor after having lab work or other tests done to discuss the meaning of your test results and whether your values are considered within normal limits. MEANING OF TEST  Your caregiver will go over the test results with you and discuss the importance and meaning of your results, as well as treatment options and the need for additional tests if necessary. OBTAINING THE TEST RESULTS It is your responsibility to obtain your test results. Ask the lab or department performing the test when and how you will get your results. Document Released: 05/04/2004 Document Revised: 04/01/2011 Document Reviewed: 05/27/2010 [employer] Patient Information 2012 [employer]. 

## 2011-09-07 NOTE — Progress Notes (Addendum)
Deborah Neal Mar 06, 1951 [id number]   History:    61 y.o.  for annual exam will with doing complaint being of vaginal dryness and irritation. Patient has not had a gynecological examination since 2009. Her last mammogram was also in 2009. Her primary physician Dr. Jonny Ruiz has been following her for hypercholesterolemia, hypertension and asthma. He has been doing her lab work. Her last bone density study according to the patient was less than 2 years ago and she was diagnosed with osteopenia I cannot retrieve those results but she will discuss with Dr. Jonny Ruiz when her next one is due.  Past medical history,surgical history, family history and social history were all reviewed and documented in the EPIC chart.  Gynecologic History No LMP recorded. Patient is postmenopausal. Contraception: none Last Pap: 2009. Results were: normal Last mammogram: 2009. Results were: normal  Obstetric History OB History    Grav Para Term Preterm Abortions TAB SAB Ect Mult Living   5 5 5       5      # Outc Date GA Lbr Len/2nd Wgt Sex Del Anes PTL Lv   1 TRM     F SVD  No Yes   2 TRM     F SVD  No Yes   3 TRM     F SVD  No Yes   4 TRM     F SVD  No Yes   5 TRM     M SVD  No Yes   Comments: PASSED AWAY AT 3 MONTHS OLD       ROS:  Was performed and pertinent positives and negatives are included in the history.  Exam: chaperone present  BP 130/82  Ht 5\' 2"  (1.575 m)  Wt 183 lb (83.008 kg)  BMI 33.47 kg/m2  Body mass index is 33.47 kg/(m^2).  General appearance : Well developed well nourished female. No acute distress HEENT: Neck supple, trachea midline, no carotid bruits, no thyroidmegaly Lungs: Clear to auscultation, no rhonchi or wheezes, or rib retractions  Heart: Regular rate and rhythm, no murmurs or gallops Breast:Examined in sitting and supine position were symmetrical in appearance, no palpable masses or tenderness,  no skin retraction, no nipple inversion, no nipple discharge, no skin  discoloration, no axillary or supraclavicular lymphadenopathy Abdomen: no palpable masses or tenderness, no rebound or guarding Extremities: no edema or skin discoloration or tenderness  Pelvic:  Bartholin, Urethra, Skene Glands: Atrophic changes            Vagina: No gross lesions or discharge, atrophic  Cervix: No gross lesions or discharge  Uterus  axial, normal size, shape and consistency, non-tender and mobile  Adnexa  Without masses or tenderness  Anus and perineum  normal   Rectovaginal  normal sphincter tone without palpated masses or tenderness             Hemoccult cards provided for patient to submit to the office for testing at a later date     Assessment/Plan:  61 y.o. female for annual exam who was reminded of the importance of calcium and vitamin D for osteoporosis prevention along with weightbearing exercises 3-4 times a week. Requisition was provided for her to schedule her mammogram as well as the Hemoccult cards for her to send to the office for testing. No labs drawn today. New Pap smear screening guidelines discussed. Patient stated her last colonoscopy was approximately 5 years ago and was reported to be normal.  For her vaginal atrophy she will  be prescribed generic estrogen vaginal cream to apply twice a week as follows: Estradiol 0.02% and 1 mL refill the applicator Apply twice a week  The risks benefits pros and cons of hormone was discussed with the patient all questions were answered and we'll follow accordingly.  Ok Edwards MD, 12:36 PM 09/07/2011

## 2011-09-23 ENCOUNTER — Other Ambulatory Visit: Payer: Self-pay | Admitting: Anesthesiology

## 2011-09-23 DIAGNOSIS — Z1211 Encounter for screening for malignant neoplasm of colon: Secondary | ICD-10-CM

## 2011-09-30 ENCOUNTER — Ambulatory Visit (HOSPITAL_COMMUNITY)
Admission: RE | Admit: 2011-09-30 | Discharge: 2011-09-30 | Disposition: A | Discharge status: Home / Self Care | Payer: BC Managed Care – PPO | Source: Ambulatory Visit | Attending: Internal Medicine | Admitting: Internal Medicine

## 2011-09-30 DIAGNOSIS — Z1231 Encounter for screening mammogram for malignant neoplasm of breast: Secondary | ICD-10-CM | POA: Insufficient documentation

## 2011-11-03 ENCOUNTER — Encounter (HOSPITAL_COMMUNITY): Payer: Self-pay | Admitting: *Deleted

## 2011-11-03 ENCOUNTER — Emergency Department (HOSPITAL_COMMUNITY)
Admission: EM | Admit: 2011-11-03 | Discharge: 2011-11-03 | Disposition: A | Discharge status: Home / Self Care | Payer: BC Managed Care – PPO | Attending: Emergency Medicine | Admitting: Emergency Medicine

## 2011-11-03 DIAGNOSIS — R209 Unspecified disturbances of skin sensation: Secondary | ICD-10-CM | POA: Insufficient documentation

## 2011-11-03 DIAGNOSIS — E876 Hypokalemia: Secondary | ICD-10-CM

## 2011-11-03 DIAGNOSIS — G629 Polyneuropathy, unspecified: Secondary | ICD-10-CM

## 2011-11-03 DIAGNOSIS — J45909 Unspecified asthma, uncomplicated: Secondary | ICD-10-CM | POA: Insufficient documentation

## 2011-11-03 DIAGNOSIS — G609 Hereditary and idiopathic neuropathy, unspecified: Secondary | ICD-10-CM | POA: Insufficient documentation

## 2011-11-03 DIAGNOSIS — M7989 Other specified soft tissue disorders: Secondary | ICD-10-CM | POA: Insufficient documentation

## 2011-11-03 DIAGNOSIS — M79609 Pain in unspecified limb: Secondary | ICD-10-CM | POA: Insufficient documentation

## 2011-11-03 DIAGNOSIS — E785 Hyperlipidemia, unspecified: Secondary | ICD-10-CM | POA: Insufficient documentation

## 2011-11-03 DIAGNOSIS — I10 Essential (primary) hypertension: Secondary | ICD-10-CM | POA: Insufficient documentation

## 2011-11-03 LAB — CBC WITH DIFFERENTIAL/PLATELET
HCT: 37.1 % (ref 36.0–46.0)
Hemoglobin: 11.8 g/dL — ABNORMAL LOW (ref 12.0–15.0)
Lymphocytes Relative: 41 % (ref 12–46)
Lymphs Abs: 2.2 10*3/uL (ref 0.7–4.0)
MCHC: 31.8 g/dL (ref 30.0–36.0)
Monocytes Absolute: 0.4 10*3/uL (ref 0.1–1.0)
Monocytes Relative: 7 % (ref 3–12)
Neutro Abs: 2.8 10*3/uL (ref 1.7–7.7)
RBC: 5.07 MIL/uL (ref 3.87–5.11)

## 2011-11-03 LAB — COMPREHENSIVE METABOLIC PANEL
Alkaline Phosphatase: 78 U/L (ref 39–117)
BUN: 12 mg/dL (ref 6–23)
CO2: 27 mEq/L (ref 19–32)
Chloride: 103 mEq/L (ref 96–112)
Creatinine, Ser: 0.76 mg/dL (ref 0.50–1.10)
GFR calc non Af Amer: 90 mL/min — ABNORMAL LOW (ref >90)
Glucose, Bld: 126 mg/dL — ABNORMAL HIGH (ref 70–99)
Total Bilirubin: 0.2 mg/dL — ABNORMAL LOW (ref 0.3–1.2)

## 2011-11-03 LAB — PHOSPHORUS: Phosphorus: 3.2 mg/dL (ref 2.3–4.6)

## 2011-11-03 LAB — MAGNESIUM: Magnesium: 2 mg/dL (ref 1.5–2.5)

## 2011-11-03 MED ORDER — POTASSIUM CHLORIDE ER 10 MEQ PO TBCR
10.0000 meq | EXTENDED_RELEASE_TABLET | Freq: Two times a day (BID) | ORAL | Status: DC
Start: 2011-11-03 — End: 2014-03-28

## 2011-11-03 MED ORDER — POTASSIUM CHLORIDE CRYS ER 20 MEQ PO TBCR
40.0000 meq | EXTENDED_RELEASE_TABLET | Freq: Once | ORAL | Status: AC
  Administered 2011-11-03: 40 meq via ORAL
  Filled 2011-11-03: qty 2

## 2011-11-03 NOTE — ED Notes (Signed)
Reports having swelling to bilateral hands for extended amount of time and pain to bilateral feet. No acute distress noted at triage.

## 2011-11-03 NOTE — ED Provider Notes (Addendum)
History    This chart was scribed for Forbes Cellar, MD, MD by Smitty Pluck. The patient was seen in room [facility] and the patient's care was started at 6:24PM.   CSN: [id number]  Arrival date & time 11/03/11  1735   First MD Initiated Contact with Patient 11/03/11 1810      Chief Complaint  Patient presents with  . Hand Pain  . Foot Pain    (Consider location/radiation/quality/duration/timing/severity/associated sxs/prior treatment) The history is provided by the patient.   Deborah Neal is a 61 y.o. female who presents to the Emergency Department complaining of moderate swelling and tingling sensation in hands and feet bilaterally onset a couple of months ago. Pt reports that she was at work when swelling started. 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 Denies chest pain, SOB, fever, chills, abdominal pain, headache, dizziness, changes in vision.  Pt denies diabetes. Reports hx of HTN. Denies being screened for diabetes. Symptoms have been constant. Denies radiation. Reports she sometimes does not have proper fluid intake. Denies hx of lupus or autoimmunity disorders   Dr. Oliver Barre is PCP (last seen 08/2011)  Arta Silence, RN 11/03/2011 17:52    Reports having swelling to bilateral hands for extended amount of time and pain to bilateral feet. No acute distress noted at triage.    Past Medical History  Diagnosis Date  . ALLERGIC RHINITIS 01/29/2007  . ASTHMA, WITH ACUTE EXACERBATION 06/24/2008  . ASTHMA 01/29/2007  . BACK PAIN 01/29/2009  . CHEST PAIN 06/24/2008  . HYPERLIPIDEMIA 01/29/2007  . HYPERTENSION 01/29/2007  . OSTEOPENIA 10/16/2007    History reviewed. No pertinent past surgical history.  Family History  Problem Relation Age of Onset  . Cancer Mother     ?    History  Substance Use Topics  . Smoking status: Former Smoker    Quit date: 09/07/2003  . Smokeless tobacco: Never Used  . Alcohol Use: No    OB  History    Grav Para Term Preterm Abortions TAB SAB Ect Mult Living   5 5 5       5       Review of Systems  All other systems reviewed and are negative.   10 Systems reviewed and all are negative for acute change except as noted in the HPI.   Allergies  Review of patient's allergies indicates no known allergies.  Home Medications   Current Outpatient Rx  Name Route Sig Dispense Refill  . ALBUTEROL SULFATE HFA 108 (90 BASE) MCG/ACT IN AERS Inhalation Inhale 2 puffs into the lungs every 6 (six) hours as needed. For wheezing    . AMLODIPINE BESY-BENAZEPRIL HCL 5-20 MG PO CAPS Oral Take 1 capsule by mouth 2 (two) times daily.    . ASPIRIN 81 MG PO TABS Oral Take 81 mg by mouth every morning.     Marland Kitchen HYDROCHLOROTHIAZIDE 25 MG PO TABS Oral Take 25 mg by mouth daily.    Marland Kitchen LOVASTATIN 40 MG PO TABS Oral Take 40 mg by mouth at bedtime.    Marland Kitchen NAPROXEN 500 MG PO TABS Oral Take 500 mg by mouth 2 (two) times daily as needed. For pain    . NONFORMULARY OR COMPOUNDED ITEM  Estradiol .02% 1ml prefilled applicator Sig: Apply one applicator twice a week 90 each 4  . POTASSIUM CHLORIDE 10 MEQ PO TBCR Oral Take 20 mEq by mouth daily.  BP 141/89  Pulse 92  Temp 98.8 F (37.1 C) (Oral)  Resp 18  SpO2 96%  Physical Exam  Nursing note and vitals reviewed. Constitutional: She is oriented to person, place, and time. She appears well-developed and well-nourished. No distress.  HENT:  Head: Normocephalic and atraumatic.  Eyes: Conjunctivae are normal.  Cardiovascular: Normal rate, regular rhythm and normal heart sounds.   Pulmonary/Chest: Effort normal and breath sounds normal. No respiratory distress.  Abdominal: Soft. She exhibits no distension. There is no tenderness.  Musculoskeletal:       Radial pulses intact minimal swelling dorsal aspect of right hand  No calf tenderness or swelling dp/pt intact bilaterally Cap refill is less than 3 sec in bilateral hand and feet Gross sensation  intact  Neurological: She is alert and oriented to person, place, and time.  Skin: Skin is warm and dry.  Psychiatric: She has a normal mood and affect. Her behavior is normal.    ED Course  Procedures (including critical care time) DIAGNOSTIC STUDIES: Oxygen Saturation is 96% on room air, normal by my interpretation.    COORDINATION OF CARE:   Labs Reviewed  CBC WITH DIFFERENTIAL  COMPREHENSIVE METABOLIC PANEL  MAGNESIUM  PHOSPHORUS   No results found.   1. Peripheral neuropathy     MDM  Likely peripheral neuropathy-- unk cause. She does have chronic exposure to chemicals at work. No new medications. No h/o DM. Doubt cancer related. Possible rheum but in NAD at this time. Check basic labs including hgb and electrolytes. Plan to discharge home if wnl. Pt signed out to [facility] midlevel provider. F/U CBC, BMP/Mg/Phos.  I personally performed the services described in this documentation, which was scribed in my presence. The recorded information has been reviewed and considered.         Forbes Cellar, MD 11/03/11 1955  Forbes Cellar, MD 11/03/11 1610

## 2012-05-02 ENCOUNTER — Encounter: Payer: Self-pay | Admitting: Internal Medicine

## 2012-05-02 ENCOUNTER — Ambulatory Visit: Payer: BC Managed Care – PPO | Admitting: Internal Medicine

## 2012-05-02 ENCOUNTER — Ambulatory Visit (INDEPENDENT_AMBULATORY_CARE_PROVIDER_SITE_OTHER): Payer: BC Managed Care – PPO | Admitting: Internal Medicine

## 2012-05-02 VITALS — BP 128/70 | HR 105 | Temp 98.9°F | Ht 64.0 in | Wt 194.0 lb

## 2012-05-02 DIAGNOSIS — J45909 Unspecified asthma, uncomplicated: Secondary | ICD-10-CM

## 2012-05-02 DIAGNOSIS — Z0000 Encounter for general adult medical examination without abnormal findings: Secondary | ICD-10-CM

## 2012-05-02 DIAGNOSIS — I10 Essential (primary) hypertension: Secondary | ICD-10-CM

## 2012-05-02 DIAGNOSIS — J209 Acute bronchitis, unspecified: Secondary | ICD-10-CM

## 2012-05-02 MED ORDER — AZITHROMYCIN 250 MG PO TABS: ORAL_TABLET | ORAL | Status: DC

## 2012-05-02 MED ORDER — HYDROCODONE-HOMATROPINE 5-1.5 MG/5ML PO SYRP: 5.0000 mL | ORAL_SOLUTION | Freq: Four times a day (QID) | ORAL | Status: DC | PRN

## 2012-05-02 NOTE — Assessment & Plan Note (Signed)
stable overall by hx and exam, most recent data reviewed with pt, and pt to continue medical treatment as before SpO2 Readings from Last 3 Encounters:  05/02/12 93%  11/03/11 99%  06/08/11 94%

## 2012-05-02 NOTE — Progress Notes (Signed)
Subjective:    Patient ID: Deborah Neal, female    DOB: [birth year], 62 y.o.   MRN: [id number]  HPI  Here with acute onset mild to mod 2-3 days ST, HA, general weakness and malaise, with prod cough greenish sputum, but Pt denies chest pain, increased sob or doe, wheezing, orthopnea, PND, increased LE swelling, palpitations, dizziness or syncope.  Pt denies new neurological symptoms such as new headache, or facial or extremity weakness or numbness   Pt denies polydipsia, polyuria.  No other new compalitns Past Medical History  Diagnosis Date  . ALLERGIC RHINITIS 01/29/2007  . ASTHMA, WITH ACUTE EXACERBATION 06/24/2008  . ASTHMA 01/29/2007  . BACK PAIN 01/29/2009  . CHEST PAIN 06/24/2008  . HYPERLIPIDEMIA 01/29/2007  . HYPERTENSION 01/29/2007  . OSTEOPENIA 10/16/2007   No past surgical history on file.  reports that she quit smoking about 8 years ago. She has never used smokeless tobacco. She reports that she does not drink alcohol or use illicit drugs. family history includes Cancer in her mother. No Known Allergies Current Outpatient Prescriptions on File Prior to Visit  Medication Sig Dispense Refill  . amLODipine-benazepril (LOTREL) 5-20 MG per capsule Take 1 capsule by mouth 2 (two) times daily.      Marland Kitchen aspirin 81 MG tablet Take 81 mg by mouth every morning.       . hydrochlorothiazide (HYDRODIURIL) 25 MG tablet Take 25 mg by mouth daily.      Marland Kitchen lovastatin (MEVACOR) 40 MG tablet Take 40 mg by mouth at bedtime.      . naproxen (NAPROSYN) 500 MG tablet Take 500 mg by mouth 2 (two) times daily as needed. For pain      . NONFORMULARY OR COMPOUNDED ITEM Estradiol .02% 1ml prefilled applicator Sig: Apply one applicator twice a week  90 each  4  . potassium chloride (K-DUR) 10 MEQ tablet Take 1 tablet (10 mEq total) by mouth 2 (two) times daily.  20 tablet  0  . potassium chloride (KLOR-CON) 10 MEQ CR tablet Take 20 mEq by mouth daily.      Marland Kitchen albuterol (PROVENTIL HFA;VENTOLIN HFA) 108 (90 BASE)  MCG/ACT inhaler Inhale 2 puffs into the lungs every 6 (six) hours as needed. For wheezing        Review of Systems  Constitutional: Negative for diaphoresis and unexpected weight change.  HENT: Negative for tinnitus.   Eyes: Negative for photophobia and visual disturbance.  Respiratory: Negative for choking and stridor.   Gastrointestinal: Negative for vomiting and blood in stool.  Genitourinary: Negative for hematuria and decreased urine volume.  Musculoskeletal: Negative for gait problem.  Skin: Negative for color change and wound.  Neurological: Negative for tremors and numbness.  Psychiatric/Behavioral: Negative for decreased concentration. The patient is not hyperactive.       Objective:   Physical Exam BP 128/70  Pulse 105  Temp 98.9 F (37.2 C) (Oral)  Ht 5\' 4"  (1.626 m)  Wt 194 lb (87.998 kg)  BMI 33.30 kg/m2  SpO2 93% Physical Exam  VS noted, mild ill Constitutional: Pt appears well-developed and well-nourished.  HENT: Head: Normocephalic.  Right Ear: External ear normal.  Left Ear: External ear normal.  Bilat tm's mild erythema.  Sinus nontender.  Pharynx mild erythema Eyes: Conjunctivae and EOM are normal. Pupils are equal, round, and reactive to light.  Neck: Normal range of motion. Neck supple.  Cardiovascular: Normal rate and regular rhythm.   Pulmonary/Chest: Effort normal and breath sounds normal.  - no  rales or wheezing Neurological: Pt is alert. Not confused  Skin: Skin is warm. No erythema.  Psychiatric: Pt behavior is normal. Thought content normal.     Assessment & Plan:

## 2012-05-02 NOTE — Patient Instructions (Addendum)
Take all new medications as prescribed Continue all other medications as before You are given the letter today Please return in 6 mo with Lab testing done 3-5 days before

## 2012-05-02 NOTE — Assessment & Plan Note (Signed)
stable overall by hx and exam, most recent data reviewed with pt, and pt to continue medical treatment as before BP Readings from Last 3 Encounters:  05/02/12 128/70  11/03/11 130/79  09/07/11 130/82

## 2012-05-02 NOTE — Assessment & Plan Note (Signed)
Mild to mod, for antibx course,  to f/u any worsening symptoms or concerns 

## 2012-07-13 ENCOUNTER — Other Ambulatory Visit: Payer: Self-pay | Admitting: Internal Medicine

## 2012-08-31 ENCOUNTER — Other Ambulatory Visit: Payer: Self-pay | Admitting: Internal Medicine

## 2012-08-31 DIAGNOSIS — Z1231 Encounter for screening mammogram for malignant neoplasm of breast: Secondary | ICD-10-CM

## 2012-08-31 LAB — HM MAMMOGRAPHY

## 2012-09-14 ENCOUNTER — Encounter: Payer: Self-pay | Admitting: Gynecology

## 2012-09-14 ENCOUNTER — Ambulatory Visit (INDEPENDENT_AMBULATORY_CARE_PROVIDER_SITE_OTHER): Payer: BC Managed Care – PPO | Admitting: Gynecology

## 2012-09-14 VITALS — BP 132/80 | Ht 62.0 in | Wt 192.0 lb

## 2012-09-14 DIAGNOSIS — M899 Disorder of bone, unspecified: Secondary | ICD-10-CM

## 2012-09-14 DIAGNOSIS — N952 Postmenopausal atrophic vaginitis: Secondary | ICD-10-CM

## 2012-09-14 DIAGNOSIS — M8580 Other specified disorders of bone density and structure, unspecified site: Secondary | ICD-10-CM

## 2012-09-14 DIAGNOSIS — M949 Disorder of cartilage, unspecified: Secondary | ICD-10-CM

## 2012-09-14 DIAGNOSIS — Z01419 Encounter for gynecological examination (general) (routine) without abnormal findings: Secondary | ICD-10-CM

## 2012-09-14 MED ORDER — NONFORMULARY OR COMPOUNDED ITEM: Status: DC

## 2012-09-14 NOTE — Patient Instructions (Addendum)
Tell Dr. Jonny Ruiz to send me a report of the last bone density  Shingles Vaccine What You Need to Know WHAT IS SHINGLES?  Shingles is a painful skin rash, often with blisters. It is also called Herpes Zoster or just Zoster.  A shingles rash usually appears on one side of the face or body and lasts from 2 to 4 weeks. Its main symptom is pain, which can be quite severe. Other symptoms of shingles can include fever, headache, chills, and upset stomach. Very rarely, a shingles infection can lead to pneumonia, hearing problems, blindness, brain inflammation (encephalitis), or death.  For about 1 person in 5, severe pain can continue even after the rash clears up. This is called post-herpetic neuralgia.  Shingles is caused by the Varicella Zoster virus. This is the same virus that causes chickenpox. Only someone who has had a case of chickenpox or rarely, has gotten chickenpox vaccine, can get shingles. The virus stays in your body. It can reappear many years later to cause a case of shingles.  You cannot catch shingles from another person with shingles. However, a person who has never had chickenpox (or chickenpox vaccine) could get chickenpox from someone with shingles. This is not very common.  Shingles is far more common in people 77 and older than in younger people. It is also more common in people whose immune systems are weakened because of a disease such as cancer or drugs such as steroids or chemotherapy.  At least 1 million people get shingles per year in the [place]. SHINGLES VACCINE  A vaccine for shingles was licensed in 2006. In clinical trials, the vaccine reduced the risk of shingles by 50%. It can also reduce the pain in people who still get shingles after being vaccinated.  A single dose of shingles vaccine is recommended for adults 74 years of age and older. SOME PEOPLE SHOULD NOT GET SHINGLES VACCINE OR SHOULD WAIT A person should not get shingles vaccine if he or she:  Has  ever had a life-threatening allergic reaction to gelatin, the antibiotic neomycin, or any other component of shingles vaccine. Tell your caregiver if you have any severe allergies.  Has a weakened immune system because of current:  AIDS or another disease that affects the immune system.  Treatment with drugs that affect the immune system, such as prolonged use of high-dose steroids.  Cancer treatment, such as radiation or chemotherapy.  Cancer affecting the bone marrow or lymphatic system, such as leukemia or lymphoma.  Is pregnant, or might be pregnant. Women should not become pregnant until at least 4 weeks after getting shingles vaccine. Someone with a minor illness, such as a cold, may be vaccinated. Anyone with a moderate or severe acute illness should usually wait until he or she recovers before getting the vaccine. This includes anyone with a temperature of 101.3 F (38 C) or higher. WHAT ARE THE RISKS FROM SHINGLES VACCINE?  A vaccine, like any medicine, could possibly cause serious problems, such as severe allergic reactions. However, the risk of a vaccine causing serious harm, or death, is extremely small.  No serious problems have been identified with shingles vaccine. Mild Problems  Redness, soreness, swelling, or itching at the site of the injection (about 1 person in 3).  Headache (about 1 person in 70). Like all vaccines, shingles vaccine is being closely monitored for unusual or severe problems. WHAT IF THERE IS A MODERATE OR SEVERE REACTION? What should I look for? Any unusual condition, such  as a severe allergic reaction or a high fever. If a severe allergic reaction occurred, it would be within a few minutes to an hour after the shot. Signs of a serious allergic reaction can include difficulty breathing, weakness, hoarseness or wheezing, a fast heartbeat, hives, dizziness, paleness, or swelling of the throat. What should I do?  Call your caregiver, or get the person  to a caregiver right away.  Tell the caregiver what happened, the date and time it happened, and when the vaccination was given.  Ask the caregiver to report the reaction by filing a Vaccine Adverse Event Reporting System (VAERS) form. Or, you can file this report through the VAERS web site at www.vaers.LAgents.no or by calling 1-641-422-2036. VAERS does not provide medical advice. HOW CAN I LEARN MORE?  Ask your caregiver. He or she can give you the vaccine package insert or suggest other sources of information.  Contact the Centers for Disease Control and Prevention (CDC):  Call 715-618-0500 (1-800-CDC-INFO).  Visit the CDC website at PicCapture.uy CDC Shingles Vaccine VIS (01/30/08) Document Released: 02/07/2006 Document Revised: 07/05/2011 Document Reviewed: 01/30/2008 ExitCare Patient Information 2014 [place], [place].

## 2012-09-14 NOTE — Progress Notes (Signed)
Deborah Neal [birth year] [id number]   History:    62 y.o.  for annual gyn exam with no complaints today.patient was started last year on estradiol vaginal cream twice a week and has done well. Patient has history of osteopenia her last bone density study was in 2010 and is being followed by her primary physician Dr. Jonny Ruiz who is also been doing her lab work. Patient scheduled for mammogram next month. She had a normal colonoscopy in 2008. Patient is not received her shingles vaccine as of yet.  Past medical history,surgical history, family history and social history were all reviewed and documented in the EPIC chart.  Gynecologic History No LMP recorded. Patient is postmenopausal. Contraception: post menopausal status Last Pap: 2013. Results were: normal Last mammogram: 2013. Results were: normal  Obstetric History OB History   Grav Para Term Preterm Abortions TAB SAB Ect Mult Living   5 5 5       5      # Outc Date GA Lbr Len/2nd Wgt Sex Del Anes PTL Lv   1 TRM     F SVD  No Yes   2 TRM     F SVD  No Yes   3 TRM     F SVD  No Yes   4 TRM     F SVD  No Yes   5 TRM     M SVD  No Yes   Comments: PASSED AWAY AT 3 MONTHS OLD       ROS: A ROS was performed and pertinent positives and negatives are included in the history.  GENERAL: No fevers or chills. HEENT: No change in vision, no earache, sore throat or sinus congestion. NECK: No pain or stiffness. CARDIOVASCULAR: No chest pain or pressure. No palpitations. PULMONARY: No shortness of breath, cough or wheeze. GASTROINTESTINAL: No abdominal pain, nausea, vomiting or diarrhea, melena or bright red blood per rectum. GENITOURINARY: No urinary frequency, urgency, hesitancy or dysuria. MUSCULOSKELETAL: No joint or muscle pain, no back pain, no recent trauma. DERMATOLOGIC: No rash, no itching, no lesions. ENDOCRINE: No polyuria, polydipsia, no heat or cold intolerance. No recent change in weight. HEMATOLOGICAL: No anemia or easy bruising or bleeding.  NEUROLOGIC: No headache, seizures, numbness, tingling or weakness. PSYCHIATRIC: No depression, no loss of interest in normal activity or change in sleep pattern.     Exam: chaperone present  BP 132/80  Ht 5\' 2"  (1.575 m)  Wt 192 lb (87.091 kg)  BMI 35.11 kg/m2  Body mass index is 35.11 kg/(m^2).  General appearance : Well developed well nourished female. No acute distress HEENT: Neck supple, trachea midline, no carotid bruits, no thyroidmegaly Lungs: Clear to auscultation, no rhonchi or wheezes, or rib retractions  Heart: Regular rate and rhythm, no murmurs or gallops Breast:Examined in sitting and supine position were symmetrical in appearance, no palpable masses or tenderness,  no skin retraction, no nipple inversion, no nipple discharge, no skin discoloration, no axillary or supraclavicular lymphadenopathy Abdomen: no palpable masses or tenderness, no rebound or guarding Extremities: no edema or skin discoloration or tenderness  Pelvic:  Bartholin, Urethra, Skene Glands: Within normal limits             Vagina: No gross lesions or discharge  Cervix: No gross lesions or discharge  Uterus  anteverted, normal size, shape and consistency, non-tender and mobile  Adnexa  Without masses or tenderness  Anus and perineum  normal   Rectovaginal  normal sphincter tone without palpated masses or  tenderness             Hemoccult cards provided     Assessment/Plan:  62 y.o. female for annual exam with history of vaginal atrophy has done well with the estradiol vaginal cream twice a week for which prescription refill was provided. She was given a prescription to go her local pharmacy for the shingles vaccine. Literature information was provided as well. We discussed the new Pap smear screening guidelines and she will not need 1 and to next year. She was reminded to do her monthly breast exam and to followup with her mammogram next month. I've asked her to ask Dr. Jonny Ruiz to send [place] a copy of her  most recent bone density study. No lab work done today. She was reminded to send to the office Hemoccult cards for testing.    Ok Edwards MD, 1:49 PM 09/14/2012

## 2012-09-19 ENCOUNTER — Encounter: Payer: Self-pay | Admitting: Internal Medicine

## 2012-09-19 ENCOUNTER — Ambulatory Visit (INDEPENDENT_AMBULATORY_CARE_PROVIDER_SITE_OTHER): Payer: BC Managed Care – PPO | Admitting: Internal Medicine

## 2012-09-19 ENCOUNTER — Other Ambulatory Visit (INDEPENDENT_AMBULATORY_CARE_PROVIDER_SITE_OTHER): Payer: BC Managed Care – PPO

## 2012-09-19 VITALS — BP 120/80 | HR 98 | Temp 99.2°F | Ht 62.0 in | Wt 191.8 lb

## 2012-09-19 DIAGNOSIS — E669 Obesity, unspecified: Secondary | ICD-10-CM | POA: Insufficient documentation

## 2012-09-19 DIAGNOSIS — Z23 Encounter for immunization: Secondary | ICD-10-CM

## 2012-09-19 DIAGNOSIS — I10 Essential (primary) hypertension: Secondary | ICD-10-CM

## 2012-09-19 DIAGNOSIS — M899 Disorder of bone, unspecified: Secondary | ICD-10-CM

## 2012-09-19 DIAGNOSIS — M949 Disorder of cartilage, unspecified: Secondary | ICD-10-CM

## 2012-09-19 DIAGNOSIS — Z2911 Encounter for prophylactic immunotherapy for respiratory syncytial virus (RSV): Secondary | ICD-10-CM

## 2012-09-19 DIAGNOSIS — Z0000 Encounter for general adult medical examination without abnormal findings: Secondary | ICD-10-CM

## 2012-09-19 LAB — BASIC METABOLIC PANEL
CO2: 28 mEq/L (ref 19–32)
GFR: 76.85 mL/min (ref >60.00)
Glucose, Bld: 90 mg/dL (ref 70–99)
Potassium: 3.5 mEq/L (ref 3.5–5.1)
Sodium: 141 mEq/L (ref 135–145)

## 2012-09-19 LAB — CBC WITH DIFFERENTIAL/PLATELET
Basophils Absolute: 0 10*3/uL (ref 0.0–0.1)
Eosinophils Absolute: 0.1 10*3/uL (ref 0.0–0.7)
HCT: 40.2 % (ref 36.0–46.0)
Hemoglobin: 12.9 g/dL (ref 12.0–15.0)
Lymphs Abs: 2.5 10*3/uL (ref 0.7–4.0)
MCHC: 32 g/dL (ref 30.0–36.0)
Monocytes Relative: 7.2 % (ref 3.0–12.0)
Neutro Abs: 3.5 10*3/uL (ref 1.4–7.7)
Platelets: 225 10*3/uL (ref 150.0–400.0)
RDW: 15.4 % — ABNORMAL HIGH (ref 11.5–14.6)

## 2012-09-19 LAB — HEPATIC FUNCTION PANEL
AST: 30 U/L (ref 0–37)
Albumin: 4.1 g/dL (ref 3.5–5.2)
Alkaline Phosphatase: 80 U/L (ref 39–117)
Bilirubin, Direct: 0.1 mg/dL (ref 0.0–0.3)
Total Protein: 7.8 g/dL (ref 6.0–8.3)

## 2012-09-19 LAB — TSH: TSH: 2.88 u[IU]/mL (ref 0.35–5.50)

## 2012-09-19 LAB — URINALYSIS, ROUTINE W REFLEX MICROSCOPIC
Bilirubin Urine: NEGATIVE
Hgb urine dipstick: NEGATIVE
Ketones, ur: NEGATIVE
Leukocytes, UA: NEGATIVE
Specific Gravity, Urine: 1.015 (ref 1.000–1.030)
Urine Glucose: NEGATIVE
Urobilinogen, UA: 0.2 (ref 0.0–1.0)

## 2012-09-19 LAB — LIPID PANEL: Total CHOL/HDL Ratio: 3

## 2012-09-19 MED ORDER — PHENTERMINE HCL 37.5 MG PO CAPS: 37.5000 mg | ORAL_CAPSULE | ORAL | Status: DC

## 2012-09-19 NOTE — Assessment & Plan Note (Signed)
Due for f/u dxa, will order 

## 2012-09-19 NOTE — Progress Notes (Signed)
Subjective:    Patient ID: Deborah Neal, female    DOB: 04/12/1951, 62 y.o.   MRN: [id number]  HPI  Here for wellness and f/u;  Overall doing ok;  Pt denies CP, worsening SOB, DOE, wheezing, orthopnea, PND, worsening LE edema, palpitations, dizziness or syncope.  Pt denies neurological change such as new headache, facial or extremity weakness.  Pt denies polydipsia, polyuria, or low sugar symptoms. Pt states overall good compliance with treatment and medications, good tolerability, and has been trying to follow lower cholesterol diet.  Pt denies worsening depressive symptoms, suicidal ideation or panic. No fever, night sweats, wt loss, loss of appetite, or other constitutional symptoms.  Pt states good ability with ADL's, has low fall risk, home safety reviewed and adequate, no other significant changes in hearing or vision, and only occasionally active with exercise.  Just had pap last wk.  Due for dxa, labs.  Asks for phentermine for wt loss, has not tried before. Due for zostavax, Still working full time as housekeeping, no worsening ortho issues Past Medical History  Diagnosis Date  . ALLERGIC RHINITIS 01/29/2007  . ASTHMA, WITH ACUTE EXACERBATION 06/24/2008  . ASTHMA 01/29/2007  . BACK PAIN 01/29/2009  . CHEST PAIN 06/24/2008  . HYPERLIPIDEMIA 01/29/2007  . HYPERTENSION 01/29/2007  . OSTEOPENIA 10/16/2007   No past surgical history on file.  reports that she quit smoking about 9 years ago. She has never used smokeless tobacco. She reports that she does not drink alcohol or use illicit drugs. family history includes Cancer in her mother; Diabetes in her father; Heart disease in her father; and Hypertension in her father. No Known Allergies Current Outpatient Prescriptions on File Prior to Visit  Medication Sig Dispense Refill  . amLODipine-benazepril (LOTREL) 5-20 MG per capsule Take 1 capsule by mouth 2 (two) times daily.      Marland Kitchen amLODipine-benazepril (LOTREL) 5-20 MG per capsule TAKE TWO CAPSULES  BY MOUTH EVERY DAY  180 capsule  3  . aspirin 81 MG tablet Take 81 mg by mouth every morning.       Marland Kitchen azithromycin (ZITHROMAX Z-PAK) 250 MG tablet Use as directed  6 each  1  . hydrochlorothiazide (HYDRODIURIL) 25 MG tablet Take 25 mg by mouth daily.      . hydrochlorothiazide (HYDRODIURIL) 25 MG tablet TAKE ONE TABLET BY MOUTH EVERY DAY  90 tablet  3  . HYDROcodone-homatropine (HYCODAN) 5-1.5 MG/5ML syrup Take 5 mLs by mouth every 6 (six) hours as needed for cough.  120 mL  1  . lovastatin (MEVACOR) 20 MG tablet TAKE TWO TABLETS BY MOUTH AT BEDTIME  180 tablet  3  . lovastatin (MEVACOR) 40 MG tablet Take 40 mg by mouth at bedtime.      . NONFORMULARY OR COMPOUNDED ITEM Estradiol .02% 1ml prefilled applicator Sig: Apply one applicator twice a week  90 each  4  . potassium chloride (K-DUR) 10 MEQ tablet Take 1 tablet (10 mEq total) by mouth 2 (two) times daily.  20 tablet  0  . potassium chloride (K-DUR) 10 MEQ tablet TAKE TWO TABLETS BY MOUTH EVERY DAY  180 tablet  3  . potassium chloride (KLOR-CON) 10 MEQ CR tablet Take 20 mEq by mouth daily.      . VENTOLIN HFA 108 (90 BASE) MCG/ACT inhaler INHALE TWO PUFFS EVERY 6 HOURS AS NEEDED FOR WHEEZING  18 each  3  . albuterol (PROVENTIL HFA;VENTOLIN HFA) 108 (90 BASE) MCG/ACT inhaler Inhale 2 puffs into the lungs every  6 (six) hours as needed. For wheezing       No current facility-administered medications on file prior to visit.   Review of Systems Constitutional: Negative for diaphoresis, activity change, appetite change or unexpected weight change.  HENT: Negative for hearing loss, ear pain, facial swelling, mouth sores and neck stiffness.   Eyes: Negative for pain, redness and visual disturbance.  Respiratory: Negative for shortness of breath and wheezing.   Cardiovascular: Negative for chest pain and palpitations.  Gastrointestinal: Negative for diarrhea, blood in stool, abdominal distention or other pain Genitourinary: Negative for  hematuria, flank pain or change in urine volume.  Musculoskeletal: Negative for myalgias and joint swelling.  Skin: Negative for color change and wound.  Neurological: Negative for syncope and numbness. other than noted Hematological: Negative for adenopathy.  Psychiatric/Behavioral: Negative for hallucinations, self-injury, decreased concentration and agitation.      Objective:   Physical Exam BP 120/80  Pulse 98  Temp(Src) 99.2 F (37.3 C) (Oral)  Ht 5\' 2"  (1.575 m)  Wt 191 lb 12 oz (86.977 kg)  BMI 35.06 kg/m2  SpO2 94% VS noted,  Constitutional: Pt is oriented to person, place, and time. Appears well-developed and well-nourished.  Head: Normocephalic and atraumatic.  Right Ear: External ear normal.  Left Ear: External ear normal.  Nose: Nose normal.  Mouth/Throat: Oropharynx is clear and moist.  Eyes: Conjunctivae and EOM are normal. Pupils are equal, round, and reactive to light.  Neck: Normal range of motion. Neck supple. No JVD present. No tracheal deviation present.  Cardiovascular: Normal rate, regular rhythm, normal heart sounds and intact distal pulses.   Pulmonary/Chest: Effort normal and breath sounds normal.  Abdominal: Soft. Bowel sounds are normal. There is no tenderness. No HSM  Musculoskeletal: Normal range of motion. Exhibits no edema.  Lymphadenopathy:  Has no cervical adenopathy.  Neurological: Pt is alert and oriented to person, place, and time. Pt has normal reflexes. No cranial nerve deficit.  Skin: Skin is warm and dry. No rash noted.  Psychiatric:  Has  normal mood and affect. Behavior is normal.     Assessment & Plan:

## 2012-09-19 NOTE — Assessment & Plan Note (Signed)
stable overall by history and exam, recent data reviewed with pt, and pt to continue medical treatment as before,  to f/u any worsening symptoms or concerns BP Readings from Last 3 Encounters:  09/19/12 120/80  09/14/12 132/80  05/02/12 128/70

## 2012-09-19 NOTE — Patient Instructions (Addendum)
You had the shingles shot today Please take all new medication as prescribed - the phentermine as prescribed Please continue all other medications as before, and refills have been done if requested. Please schedule the bone density test at the Checkout/scheduling desk as you leave Please go to the LAB in the Basement (turn left off the elevator) for the tests to be done today You will be contacted by phone if any changes need to be made immediately.  Otherwise, you will receive a letter about your results with an explanation  Please remember to sign up for My Chart if you have not done so, as this will be important to you in the future with finding out test results, communicating by private email, and scheduling acute appointments online when needed.  Please return in 1 year for your yearly visit, or sooner if needed

## 2012-09-19 NOTE — Assessment & Plan Note (Signed)
Ok for phentermine asd 

## 2012-09-19 NOTE — Assessment & Plan Note (Signed)

## 2012-09-19 NOTE — Addendum Note (Signed)
Addended by: Scharlene Gloss B on: 09/19/2012 02:38 PM   Modules accepted: Orders

## 2012-09-21 ENCOUNTER — Other Ambulatory Visit: Payer: Self-pay | Admitting: Anesthesiology

## 2012-09-21 DIAGNOSIS — Z1211 Encounter for screening for malignant neoplasm of colon: Secondary | ICD-10-CM

## 2012-09-26 ENCOUNTER — Ambulatory Visit (INDEPENDENT_AMBULATORY_CARE_PROVIDER_SITE_OTHER)
Admission: RE | Admit: 2012-09-26 | Discharge: 2012-09-26 | Disposition: A | Discharge status: Home / Self Care | Payer: BC Managed Care – PPO | Source: Ambulatory Visit | Attending: Internal Medicine | Admitting: Internal Medicine

## 2012-09-26 DIAGNOSIS — M949 Disorder of cartilage, unspecified: Secondary | ICD-10-CM

## 2012-09-26 DIAGNOSIS — M899 Disorder of bone, unspecified: Secondary | ICD-10-CM

## 2012-10-02 ENCOUNTER — Ambulatory Visit (HOSPITAL_COMMUNITY)
Admission: RE | Admit: 2012-10-02 | Discharge: 2012-10-02 | Disposition: A | Discharge status: Home / Self Care | Payer: BC Managed Care – PPO | Source: Ambulatory Visit | Attending: Internal Medicine | Admitting: Internal Medicine

## 2012-10-02 ENCOUNTER — Other Ambulatory Visit: Payer: Self-pay | Admitting: Internal Medicine

## 2012-10-02 ENCOUNTER — Encounter: Payer: Self-pay | Admitting: Internal Medicine

## 2012-10-02 DIAGNOSIS — Z1231 Encounter for screening mammogram for malignant neoplasm of breast: Secondary | ICD-10-CM | POA: Insufficient documentation

## 2012-10-02 MED ORDER — ALENDRONATE SODIUM 70 MG PO TABS: 70.0000 mg | ORAL_TABLET | ORAL | Status: DC

## 2013-05-30 ENCOUNTER — Ambulatory Visit (INDEPENDENT_AMBULATORY_CARE_PROVIDER_SITE_OTHER): Payer: BC Managed Care – PPO | Admitting: Internal Medicine

## 2013-05-30 ENCOUNTER — Encounter: Payer: Self-pay | Admitting: Internal Medicine

## 2013-05-30 VITALS — BP 120/82 | HR 95 | Temp 97.4°F | Ht 64.0 in | Wt 186.4 lb

## 2013-05-30 DIAGNOSIS — E785 Hyperlipidemia, unspecified: Secondary | ICD-10-CM

## 2013-05-30 DIAGNOSIS — J45909 Unspecified asthma, uncomplicated: Secondary | ICD-10-CM

## 2013-05-30 DIAGNOSIS — Z0000 Encounter for general adult medical examination without abnormal findings: Secondary | ICD-10-CM

## 2013-05-30 DIAGNOSIS — I10 Essential (primary) hypertension: Secondary | ICD-10-CM

## 2013-05-30 DIAGNOSIS — Z23 Encounter for immunization: Secondary | ICD-10-CM

## 2013-05-30 NOTE — Assessment & Plan Note (Signed)
stable overall by history and exam, recent data reviewed with pt, and pt to continue medical treatment as before,  to f/u any worsening symptoms or concerns SpO2 Readings from Last 3 Encounters:  05/30/13 94%  09/19/12 94%  05/02/12 93%

## 2013-05-30 NOTE — Assessment & Plan Note (Signed)
stable overall by history and exam, recent data reviewed with pt, and pt to continue medical treatment as before,  to f/u any worsening symptoms or concerns BP Readings from Last 3 Encounters:  05/30/13 120/82  09/19/12 120/80  09/14/12 132/80

## 2013-05-30 NOTE — Progress Notes (Signed)
Subjective:    Patient ID: Deborah Neal, female    DOB: 25-Feb-1951, 63 y.o.   MRN: [id number]  HPI  Here to f/u; overall doing ok,  Pt denies chest pain, increased sob or doe, wheezing, orthopnea, PND, increased LE swelling, palpitations, dizziness or syncope.  Pt denies polydipsia, polyuria, or low sugar symptoms such as weakness or confusion improved with po intake.  Pt denies new neurological symptoms such as new headache, or facial or extremity weakness or numbness.   Pt states overall good compliance with meds, has been trying to follow lower cholesterol diet, with wt overall stable,  but little exercise however. Has only used her inhaler 1-2 times per wk, usually prior to exercise which lately has seemed to bring on the symptoms Past Medical History  Diagnosis Date  . ALLERGIC RHINITIS 01/29/2007  . ASTHMA, WITH ACUTE EXACERBATION 06/24/2008  . ASTHMA 01/29/2007  . BACK PAIN 01/29/2009  . CHEST PAIN 06/24/2008  . HYPERLIPIDEMIA 01/29/2007  . HYPERTENSION 01/29/2007  . OSTEOPENIA 10/16/2007   No past surgical history on file.  reports that she quit smoking about 9 years ago. She has never used smokeless tobacco. She reports that she does not drink alcohol or use illicit drugs. family history includes Cancer in her mother; Diabetes in her father; Heart disease in her father; Hypertension in her father. No Known Allergies Current Outpatient Prescriptions on File Prior to Visit  Medication Sig Dispense Refill  . alendronate (FOSAMAX) 70 MG tablet Take 1 tablet (70 mg total) by mouth every 7 (seven) days. Take with a full glass of water on an empty stomach.  12 tablet  3  . amLODipine-benazepril (LOTREL) 5-20 MG per capsule Take 1 capsule by mouth 2 (two) times daily.      Marland Kitchen amLODipine-benazepril (LOTREL) 5-20 MG per capsule TAKE TWO CAPSULES BY MOUTH EVERY DAY  180 capsule  3  . aspirin 81 MG tablet Take 81 mg by mouth every morning.       . hydrochlorothiazide (HYDRODIURIL) 25 MG tablet Take 25  mg by mouth daily.      . hydrochlorothiazide (HYDRODIURIL) 25 MG tablet TAKE ONE TABLET BY MOUTH EVERY DAY  90 tablet  3  . lovastatin (MEVACOR) 20 MG tablet TAKE TWO TABLETS BY MOUTH AT BEDTIME  180 tablet  3  . lovastatin (MEVACOR) 40 MG tablet Take 40 mg by mouth at bedtime.      . NONFORMULARY OR COMPOUNDED ITEM Estradiol .02% 62ml prefilled applicator Sig: Apply one applicator twice a week  90 each  4  . phentermine 37.5 MG capsule Take 1 capsule (37.5 mg total) by mouth every morning.  90 capsule  0  . potassium chloride (K-DUR) 10 MEQ tablet TAKE TWO TABLETS BY MOUTH EVERY DAY  180 tablet  3  . potassium chloride (KLOR-CON) 10 MEQ CR tablet Take 20 mEq by mouth daily.      . VENTOLIN HFA 108 (90 BASE) MCG/ACT inhaler INHALE TWO PUFFS EVERY 6 HOURS AS NEEDED FOR WHEEZING  18 each  3  . albuterol (PROVENTIL HFA;VENTOLIN HFA) 108 (90 BASE) MCG/ACT inhaler Inhale 2 puffs into the lungs every 6 (six) hours as needed. For wheezing      . potassium chloride (K-DUR) 10 MEQ tablet Take 1 tablet (10 mEq total) by mouth 2 (two) times daily.  20 tablet  0   No current facility-administered medications on file prior to visit.   Review of Systems  Constitutional: Negative for unexpected weight  change, or unusual diaphoresis  HENT: Negative for tinnitus.   Eyes: Negative for photophobia and visual disturbance.  Respiratory: Negative for choking and stridor.   Gastrointestinal: Negative for vomiting and blood in stool.  Genitourinary: Negative for hematuria and decreased urine volume.  Musculoskeletal: Negative for acute joint swelling Skin: Negative for color change and wound.  Neurological: Negative for tremors and numbness other than noted  Psychiatric/Behavioral: Negative for decreased concentration or  hyperactivity.       Objective:   Physical Exam BP 120/82  Pulse 95  Temp(Src) 97.4 F (36.3 C) (Oral)  Ht 5\' 4"  (1.626 m)  Wt 186 lb 6 oz (84.539 kg)  BMI 31.98 kg/m2  SpO2 94% VS  noted,  Constitutional: Pt appears well-developed and well-nourished.  HENT: Head: NCAT.  Right Ear: External ear normal.  Left Ear: External ear normal.  Eyes: Conjunctivae and EOM are normal. Pupils are equal, round, and reactive to light.  Neck: Normal range of motion. Neck supple.  Cardiovascular: Normal rate and regular rhythm.   Pulmonary/Chest: Effort normal and breath sounds normal.  Abd:  Soft, NT, non-distended, + BS Neurological: Pt is alert. Not confused  Skin: Skin is warm. No erythema.  Psychiatric: Pt behavior is normal. Thought content normal.     Assessment & Plan:

## 2013-05-30 NOTE — Patient Instructions (Addendum)
You had the flu shot today Please continue all other medications as before, and refills have been done if requested. Please have the pharmacy call with any other refills you may need. I think we can hold on lab work today  Please continue your efforts at being more active, low cholesterol diet, and weight control.  Please return in 6 months, or sooner if needed, with Lab testing done 3-5 days before

## 2013-05-30 NOTE — Assessment & Plan Note (Signed)
stable overall by history and exam, recent data reviewed with pt, and pt to continue medical treatment as before,  to f/u any worsening symptoms or concerns Lab Results  Component Value Date   LDLCALC 91 09/19/2012

## 2013-05-30 NOTE — Progress Notes (Signed)
Pre-visit discussion using our clinic review tool. No additional management support is needed unless otherwise documented below in the visit note.  

## 2013-08-29 ENCOUNTER — Other Ambulatory Visit: Payer: Self-pay | Admitting: Internal Medicine

## 2013-11-28 ENCOUNTER — Encounter: Payer: Self-pay | Admitting: Internal Medicine

## 2013-11-28 ENCOUNTER — Other Ambulatory Visit (INDEPENDENT_AMBULATORY_CARE_PROVIDER_SITE_OTHER): Payer: BC Managed Care – PPO

## 2013-11-28 ENCOUNTER — Ambulatory Visit (INDEPENDENT_AMBULATORY_CARE_PROVIDER_SITE_OTHER): Payer: BC Managed Care – PPO | Admitting: Internal Medicine

## 2013-11-28 VITALS — BP 132/78 | HR 94 | Temp 99.1°F | Wt 191.0 lb

## 2013-11-28 DIAGNOSIS — Z0000 Encounter for general adult medical examination without abnormal findings: Secondary | ICD-10-CM

## 2013-11-28 DIAGNOSIS — R21 Rash and other nonspecific skin eruption: Secondary | ICD-10-CM

## 2013-11-28 DIAGNOSIS — I10 Essential (primary) hypertension: Secondary | ICD-10-CM

## 2013-11-28 LAB — BASIC METABOLIC PANEL
BUN: 17 mg/dL (ref 6–23)
CALCIUM: 9.6 mg/dL (ref 8.4–10.5)
CO2: 30 mEq/L (ref 19–32)
Chloride: 100 mEq/L (ref 96–112)
Creatinine, Ser: 1 mg/dL (ref 0.4–1.2)
GFR: 70.52 mL/min (ref >60.00)
GLUCOSE: 129 mg/dL — AB (ref 70–99)
Potassium: 3.4 mEq/L — ABNORMAL LOW (ref 3.5–5.1)
Sodium: 137 mEq/L (ref 135–145)

## 2013-11-28 LAB — CBC WITH DIFFERENTIAL/PLATELET
Basophils Absolute: 0 10*3/uL (ref 0.0–0.1)
Basophils Relative: 0.5 % (ref 0.0–3.0)
EOS ABS: 0.1 10*3/uL (ref 0.0–0.7)
Eosinophils Relative: 2.7 % (ref 0.0–5.0)
HCT: 38.3 % (ref 36.0–46.0)
HEMOGLOBIN: 12.3 g/dL (ref 12.0–15.0)
LYMPHS PCT: 41.3 % (ref 12.0–46.0)
Lymphs Abs: 2.1 10*3/uL (ref 0.7–4.0)
MCHC: 32 g/dL (ref 30.0–36.0)
MCV: 75.1 fl — ABNORMAL LOW (ref 78.0–100.0)
Monocytes Absolute: 0.5 10*3/uL (ref 0.1–1.0)
Monocytes Relative: 9.2 % (ref 3.0–12.0)
NEUTROS ABS: 2.4 10*3/uL (ref 1.4–7.7)
Neutrophils Relative %: 46.3 % (ref 43.0–77.0)
PLATELETS: 226 10*3/uL (ref 150.0–400.0)
RBC: 5.1 Mil/uL (ref 3.87–5.11)
RDW: 15.8 % — ABNORMAL HIGH (ref 11.5–15.5)
WBC: 5.1 10*3/uL (ref 4.0–10.5)

## 2013-11-28 LAB — HEPATIC FUNCTION PANEL
ALBUMIN: 4.2 g/dL (ref 3.5–5.2)
ALK PHOS: 69 U/L (ref 39–117)
ALT: 38 U/L — AB (ref 0–35)
AST: 35 U/L (ref 0–37)
BILIRUBIN DIRECT: 0.1 mg/dL (ref 0.0–0.3)
TOTAL PROTEIN: 7.6 g/dL (ref 6.0–8.3)
Total Bilirubin: 0.5 mg/dL (ref 0.2–1.2)

## 2013-11-28 LAB — LIPID PANEL
CHOLESTEROL: 161 mg/dL (ref 0–200)
HDL: 42.9 mg/dL (ref >39.00)
LDL CALC: 90 mg/dL (ref 0–99)
NonHDL: 118.1
TRIGLYCERIDES: 140 mg/dL (ref 0.0–149.0)
Total CHOL/HDL Ratio: 4
VLDL: 28 mg/dL (ref 0.0–40.0)

## 2013-11-28 LAB — URINALYSIS, ROUTINE W REFLEX MICROSCOPIC
Bilirubin Urine: NEGATIVE
Hgb urine dipstick: NEGATIVE
KETONES UR: NEGATIVE
Nitrite: NEGATIVE
RBC / HPF: NONE SEEN (ref 0–?)
Specific Gravity, Urine: 1.02 (ref 1.000–1.030)
Total Protein, Urine: NEGATIVE
URINE GLUCOSE: NEGATIVE
UROBILINOGEN UA: 0.2 (ref 0.0–1.0)
pH: 5.5 (ref 5.0–8.0)

## 2013-11-28 LAB — TSH: TSH: 3.4 u[IU]/mL (ref 0.35–4.50)

## 2013-11-28 MED ORDER — METHYLPREDNISOLONE ACETATE 80 MG/ML IJ SUSP
80.0000 mg | Freq: Once | INTRAMUSCULAR | Status: AC
  Administered 2013-11-28: 80 mg via INTRAMUSCULAR

## 2013-11-28 MED ORDER — TRIAMCINOLONE ACETONIDE 0.1 % EX CREA: 1.0000 "application " | TOPICAL_CREAM | Freq: Two times a day (BID) | CUTANEOUS | Status: DC

## 2013-11-28 NOTE — Progress Notes (Signed)
Pre visit review using our clinic review tool, if applicable. No additional management support is needed unless otherwise documented below in the visit note. 

## 2013-11-28 NOTE — Patient Instructions (Signed)
You had the steroid shot today  Please take all new medication as prescribed - the steroid cream as needed  You can also use benadryl cream OTC in the future for itchy areas as well  If not better in the next week, please call for dermatology referral  Please continue all other medications as before, and refills have been done if requested.  Please have the pharmacy call with any other refills you may need.  Please continue your efforts at being more active, low cholesterol diet, and weight control.  You are otherwise up to date with prevention measures today.  Please keep your appointments with your specialists as you may have planned  Please go to the LAB in the Basement (turn left off the elevator) for the tests to be done today  You will be contacted by phone if any changes need to be made immediately.  Otherwise, you will receive a letter about your results with an explanation, but please check with MyChart first.  Please remember to sign up for MyChart if you have not done so, as this will be important to you in the future with finding out test results, communicating by private email, and scheduling acute appointments online when needed.  Please return in 6 months, or sooner if needed

## 2013-11-28 NOTE — Progress Notes (Signed)
Subjective:    Patient ID: Deborah Neal, female    DOB: 01-09-1951, 63 y.o.   MRN: [id number]  HPI  Here for wellness and f/u;  Overall doing ok;  Pt denies CP, worsening SOB, DOE, wheezing, orthopnea, PND, worsening LE edema, palpitations, dizziness or syncope.  Pt denies neurological change such as new headache, facial or extremity weakness.  Pt denies polydipsia, polyuria, or low sugar symptoms. Pt states overall good compliance with treatment and medications, good tolerability, and has been trying to follow lower cholesterol diet.  Pt denies worsening depressive symptoms, suicidal ideation or panic. No fever, night sweats, wt loss, loss of appetite, or other constitutional symptoms.  Pt states good ability with ADL's, has low fall risk, home safety reviewed and adequate, no other significant changes in hearing or vision, and only occasionally active with exercise. Also with erythem rash itchy numerous small 5 mm or less lesions nontender to extrem's.   Past Medical History  Diagnosis Date  . ALLERGIC RHINITIS 01/29/2007  . ASTHMA, WITH ACUTE EXACERBATION 06/24/2008  . ASTHMA 01/29/2007  . BACK PAIN 01/29/2009  . CHEST PAIN 06/24/2008  . HYPERLIPIDEMIA 01/29/2007  . HYPERTENSION 01/29/2007  . OSTEOPENIA 10/16/2007  . Type II or unspecified type diabetes mellitus without mention of complication, uncontrolled 11/29/2013   No past surgical history on file.  reports that she quit smoking about 10 years ago. She has never used smokeless tobacco. She reports that she does not drink alcohol or use illicit drugs. family history includes Cancer in her mother; Diabetes in her father; Heart disease in her father; Hypertension in her father. No Known Allergies Current Outpatient Prescriptions on File Prior to Visit  Medication Sig Dispense Refill  . alendronate (FOSAMAX) 70 MG tablet TAKE ONE TABLET BY MOU TH EVERY 7 DAYS, TAKE WITH A FULL GLASS OF WATER ON AN EMPTY STOMACH  12 tablet  3  .  amLODipine-benazepril (LOTREL) 5-20 MG per capsule Take 1 capsule by mouth 2 (two) times daily.      Marland Kitchen amLODipine-benazepril (LOTREL) 5-20 MG per capsule TAKE TWO CAPSULES BY MOUTH ONCE DAILY  180 capsule  3  . aspirin 81 MG tablet Take 81 mg by mouth every morning.       . hydrochlorothiazide (HYDRODIURIL) 25 MG tablet Take 25 mg by mouth daily.      . hydrochlorothiazide (HYDRODIURIL) 25 MG tablet TAKE ONE TABLET BY MOUTH ONCE DAILY  90 tablet  3  . lovastatin (MEVACOR) 20 MG tablet TAKE TWO TABLETS BY MOUTH AT BEDTIME  180 tablet  3  . lovastatin (MEVACOR) 40 MG tablet Take 40 mg by mouth at bedtime.      . NONFORMULARY OR COMPOUNDED ITEM Estradiol .02% 25ml prefilled applicator Sig: Apply one applicator twice a week  90 each  4  . phentermine 37.5 MG capsule Take 1 capsule (37.5 mg total) by mouth every morning.  90 capsule  0  . potassium chloride (K-DUR) 10 MEQ tablet TAKE TWO TABLETS BY MOUTH ONCE DAILY  180 tablet  3  . potassium chloride (KLOR-CON) 10 MEQ CR tablet Take 20 mEq by mouth daily.      . VENTOLIN HFA 108 (90 BASE) MCG/ACT inhaler INHALE TWO PUFFS EVERY 6 HOURS AS NEEDED FOR WHEEZING  18 each  3  . albuterol (PROVENTIL HFA;VENTOLIN HFA) 108 (90 BASE) MCG/ACT inhaler Inhale 2 puffs into the lungs every 6 (six) hours as needed. For wheezing      . potassium chloride (K-DUR)  10 MEQ tablet Take 1 tablet (10 mEq total) by mouth 2 (two) times daily.  20 tablet  0   No current facility-administered medications on file prior to visit.   Review of Systems Constitutional: Negative for increased diaphoresis, other activity, appetite or other siginficant weight change  HENT: Negative for worsening hearing loss, ear pain, facial swelling, mouth sores and neck stiffness.   Eyes: Negative for other worsening pain, redness or visual disturbance.  Respiratory: Negative for shortness of breath and wheezing.   Cardiovascular: Negative for chest pain and palpitations.  Gastrointestinal:  Negative for diarrhea, blood in stool, abdominal distention or other pain Genitourinary: Negative for hematuria, flank pain or change in urine volume.  Musculoskeletal: Negative for myalgias or other joint complaints.  Skin: Negative for color change and wound.  Neurological: Negative for syncope and numbness. other than noted Hematological: Negative for adenopathy. or other swelling Psychiatric/Behavioral: Negative for hallucinations, self-injury, decreased concentration or other worsening agitation. \    Objective:   Physical Exam BP 132/78  Pulse 94  Temp(Src) 99.1 F (37.3 C) (Oral)  Wt 191 lb (86.637 kg)  SpO2 97% VS noted,  Constitutional: Pt is oriented to person, place, and time. Appears well-developed and well-nourished.  Head: Normocephalic and atraumatic.  Right Ear: External ear normal.  Left Ear: External ear normal.  Nose: Nose normal.  Mouth/Throat: Oropharynx is clear and moist.  Eyes: Conjunctivae and EOM are normal. Pupils are equal, round, and reactive to light.  Neck: Normal range of motion. Neck supple. No JVD present. No tracheal deviation present.  Cardiovascular: Normal rate, regular rhythm, normal heart sounds and intact distal pulses.   Pulmonary/Chest: Effort normal and breath sounds without rales or wheezing  Abdominal: Soft. Bowel sounds are normal. NT. No HSM  Musculoskeletal: Normal range of motion. Exhibits no edema.  Lymphadenopathy:  Has no cervical adenopathy.  Neurological: Pt is alert and oriented to person, place, and time. Pt has normal reflexes. No cranial nerve deficit. Motor grossly intact Skin: Skin is warm and dry. Erythem nontender < 5 mm lesions numerous to extrrem's, no ulcer Psychiatric:  Has mild nervous mood and affect. Behavior is normal.     Assessment & Plan:

## 2013-11-29 ENCOUNTER — Encounter: Payer: Self-pay | Admitting: Internal Medicine

## 2013-11-29 ENCOUNTER — Other Ambulatory Visit: Payer: Self-pay | Admitting: Internal Medicine

## 2013-11-29 ENCOUNTER — Ambulatory Visit: Payer: BC Managed Care – PPO

## 2013-11-29 DIAGNOSIS — E119 Type 2 diabetes mellitus without complications: Secondary | ICD-10-CM | POA: Insufficient documentation

## 2013-11-29 DIAGNOSIS — IMO0001 Reserved for inherently not codable concepts without codable children: Secondary | ICD-10-CM

## 2013-11-29 DIAGNOSIS — R7309 Other abnormal glucose: Secondary | ICD-10-CM

## 2013-11-29 DIAGNOSIS — E1165 Type 2 diabetes mellitus with hyperglycemia: Principal | ICD-10-CM

## 2013-11-29 HISTORY — DX: Reserved for inherently not codable concepts without codable children: IMO0001

## 2013-11-29 LAB — HEMOGLOBIN A1C: Hgb A1c MFr Bld: 8.1 % — ABNORMAL HIGH (ref 4.6–6.5)

## 2013-11-29 MED ORDER — METFORMIN HCL ER 500 MG PO TB24: 500.0000 mg | ORAL_TABLET | Freq: Every day | ORAL | Status: DC

## 2013-12-02 NOTE — Assessment & Plan Note (Signed)
stable overall by history and exam, recent data reviewed with pt, and pt to continue medical treatment as before,  to f/u any worsening symptoms or concerns BP Readings from Last 3 Encounters:  11/28/13 132/78  05/30/13 120/82  09/19/12 120/80

## 2013-12-02 NOTE — Assessment & Plan Note (Addendum)
Mild to mod, prob allergic, for depomedrol IM, steroid cr prn, to f/u any worsening symptoms or concerns

## 2013-12-02 NOTE — Assessment & Plan Note (Signed)

## 2013-12-06 ENCOUNTER — Ambulatory Visit: Payer: BC Managed Care – PPO

## 2013-12-13 ENCOUNTER — Ambulatory Visit: Payer: BC Managed Care – PPO

## 2013-12-18 ENCOUNTER — Encounter: Payer: BC Managed Care – PPO | Attending: Internal Medicine

## 2013-12-20 ENCOUNTER — Ambulatory Visit: Payer: BC Managed Care – PPO

## 2013-12-25 ENCOUNTER — Ambulatory Visit: Payer: BC Managed Care – PPO

## 2013-12-28 ENCOUNTER — Other Ambulatory Visit: Payer: Self-pay | Admitting: Internal Medicine

## 2013-12-28 DIAGNOSIS — Z1231 Encounter for screening mammogram for malignant neoplasm of breast: Secondary | ICD-10-CM

## 2014-01-01 ENCOUNTER — Ambulatory Visit: Payer: BC Managed Care – PPO

## 2014-01-17 ENCOUNTER — Ambulatory Visit (HOSPITAL_COMMUNITY)
Admission: RE | Admit: 2014-01-17 | Discharge: 2014-01-17 | Disposition: A | Discharge status: Home / Self Care | Payer: BC Managed Care – PPO | Source: Ambulatory Visit | Attending: Internal Medicine | Admitting: Internal Medicine

## 2014-01-17 DIAGNOSIS — Z1231 Encounter for screening mammogram for malignant neoplasm of breast: Secondary | ICD-10-CM | POA: Diagnosis not present

## 2014-02-25 ENCOUNTER — Encounter: Payer: Self-pay | Admitting: Internal Medicine

## 2014-03-11 ENCOUNTER — Encounter (HOSPITAL_COMMUNITY): Payer: Self-pay | Admitting: Emergency Medicine

## 2014-03-11 ENCOUNTER — Emergency Department (HOSPITAL_COMMUNITY)
Admission: EM | Admit: 2014-03-11 | Discharge: 2014-03-11 | Disposition: A | Discharge status: Home / Self Care | Payer: BC Managed Care – PPO | Attending: Emergency Medicine | Admitting: Emergency Medicine

## 2014-03-11 ENCOUNTER — Emergency Department (HOSPITAL_COMMUNITY): Payer: BC Managed Care – PPO

## 2014-03-11 DIAGNOSIS — R059 Cough, unspecified: Secondary | ICD-10-CM

## 2014-03-11 DIAGNOSIS — R918 Other nonspecific abnormal finding of lung field: Secondary | ICD-10-CM | POA: Diagnosis not present

## 2014-03-11 DIAGNOSIS — Z7982 Long term (current) use of aspirin: Secondary | ICD-10-CM | POA: Diagnosis not present

## 2014-03-11 DIAGNOSIS — I10 Essential (primary) hypertension: Secondary | ICD-10-CM | POA: Insufficient documentation

## 2014-03-11 DIAGNOSIS — E119 Type 2 diabetes mellitus without complications: Secondary | ICD-10-CM | POA: Diagnosis not present

## 2014-03-11 DIAGNOSIS — R229 Localized swelling, mass and lump, unspecified: Secondary | ICD-10-CM

## 2014-03-11 DIAGNOSIS — Z87891 Personal history of nicotine dependence: Secondary | ICD-10-CM | POA: Insufficient documentation

## 2014-03-11 DIAGNOSIS — E785 Hyperlipidemia, unspecified: Secondary | ICD-10-CM | POA: Insufficient documentation

## 2014-03-11 DIAGNOSIS — Z79899 Other long term (current) drug therapy: Secondary | ICD-10-CM | POA: Insufficient documentation

## 2014-03-11 DIAGNOSIS — R05 Cough: Secondary | ICD-10-CM

## 2014-03-11 DIAGNOSIS — IMO0002 Reserved for concepts with insufficient information to code with codable children: Secondary | ICD-10-CM

## 2014-03-11 DIAGNOSIS — R52 Pain, unspecified: Secondary | ICD-10-CM | POA: Diagnosis present

## 2014-03-11 DIAGNOSIS — J45909 Unspecified asthma, uncomplicated: Secondary | ICD-10-CM | POA: Diagnosis not present

## 2014-03-11 LAB — COMPREHENSIVE METABOLIC PANEL
ALT: 29 U/L (ref 0–35)
ANION GAP: 16 — AB (ref 5–15)
AST: 26 U/L (ref 0–37)
Albumin: 4.1 g/dL (ref 3.5–5.2)
Alkaline Phosphatase: 78 U/L (ref 39–117)
BILIRUBIN TOTAL: 0.3 mg/dL (ref 0.3–1.2)
BUN: 15 mg/dL (ref 6–23)
CALCIUM: 9.9 mg/dL (ref 8.4–10.5)
CHLORIDE: 99 meq/L (ref 96–112)
CO2: 27 mEq/L (ref 19–32)
CREATININE: 0.94 mg/dL (ref 0.50–1.10)
GFR calc Af Amer: 74 mL/min — ABNORMAL LOW (ref >90)
GFR, EST NON AFRICAN AMERICAN: 64 mL/min — AB (ref >90)
Glucose, Bld: 120 mg/dL — ABNORMAL HIGH (ref 70–99)
Potassium: 3.7 mEq/L (ref 3.7–5.3)
Sodium: 142 mEq/L (ref 137–147)
Total Protein: 8 g/dL (ref 6.0–8.3)

## 2014-03-11 LAB — URINALYSIS, ROUTINE W REFLEX MICROSCOPIC
Bilirubin Urine: NEGATIVE
GLUCOSE, UA: NEGATIVE mg/dL
Hgb urine dipstick: NEGATIVE
Ketones, ur: NEGATIVE mg/dL
LEUKOCYTES UA: NEGATIVE
NITRITE: NEGATIVE
PROTEIN: NEGATIVE mg/dL
Specific Gravity, Urine: 1.024 (ref 1.005–1.030)
Urobilinogen, UA: 1 mg/dL (ref 0.0–1.0)
pH: 5 (ref 5.0–8.0)

## 2014-03-11 LAB — CBC WITH DIFFERENTIAL/PLATELET
BASOS ABS: 0 10*3/uL (ref 0.0–0.1)
Basophils Relative: 0 % (ref 0–1)
EOS PCT: 2 % (ref 0–5)
Eosinophils Absolute: 0.2 10*3/uL (ref 0.0–0.7)
HEMATOCRIT: 37.7 % (ref 36.0–46.0)
HEMOGLOBIN: 12.2 g/dL (ref 12.0–15.0)
LYMPHS PCT: 26 % (ref 12–46)
Lymphs Abs: 1.7 10*3/uL (ref 0.7–4.0)
MCH: 24.5 pg — ABNORMAL LOW (ref 26.0–34.0)
MCHC: 32.4 g/dL (ref 30.0–36.0)
MCV: 75.9 fL — AB (ref 78.0–100.0)
MONO ABS: 0.3 10*3/uL (ref 0.1–1.0)
MONOS PCT: 5 % (ref 3–12)
Neutro Abs: 4.5 10*3/uL (ref 1.7–7.7)
Neutrophils Relative %: 67 % (ref 43–77)
Platelets: 226 10*3/uL (ref 150–400)
RBC: 4.97 MIL/uL (ref 3.87–5.11)
RDW: 15.4 % (ref 11.5–15.5)
WBC: 6.7 10*3/uL (ref 4.0–10.5)

## 2014-03-11 MED ORDER — IOHEXOL 300 MG/ML  SOLN
80.0000 mL | Freq: Once | INTRAMUSCULAR | Status: AC | PRN
  Administered 2014-03-11: 80 mL via INTRAVENOUS

## 2014-03-11 NOTE — Discharge Instructions (Signed)
Follow up with [name] at 2 pm tomorrow.  Give him a copy of your x-ray tests

## 2014-03-11 NOTE — ED Provider Notes (Signed)
CSN: [id number]     Arrival date & time 03/11/14  4765 History   First MD Initiated Contact with Patient 03/11/14 [phone]     Chief Complaint  Patient presents with  . Generalized Body Aches  . Sore Throat  . Flank Pain     (Consider location/radiation/quality/duration/timing/severity/associated sxs/prior Treatment) Patient is a 63 y.o. female presenting with weakness. The history is provided by the patient (the pt complains of some aches and back pain).  Weakness This is a new problem. The current episode started more than 2 days ago. The problem occurs daily. The problem has not changed since onset.Pertinent negatives include no chest pain, no abdominal pain and no headaches. Nothing aggravates the symptoms. Nothing relieves the symptoms.    Past Medical History  Diagnosis Date  . ALLERGIC RHINITIS 01/29/2007  . ASTHMA, WITH ACUTE EXACERBATION 06/24/2008  . ASTHMA 01/29/2007  . BACK PAIN 01/29/2009  . CHEST PAIN 06/24/2008  . HYPERLIPIDEMIA 01/29/2007  . HYPERTENSION 01/29/2007  . OSTEOPENIA 10/16/2007  . Type II or unspecified type diabetes mellitus without mention of complication, uncontrolled 11/29/2013   History reviewed. No pertinent past surgical history. Family History  Problem Relation Age of Onset  . Cancer Mother     ?  . Diabetes Father   . Hypertension Father   . Heart disease Father    History  Substance Use Topics  . Smoking status: Former Smoker    Quit date: 09/07/2003  . Smokeless tobacco: Never Used  . Alcohol Use: No   OB History    Gravida Para Term Preterm AB TAB SAB Ectopic Multiple Living   5 5 5       5      Review of Systems  Constitutional: Negative for appetite change and fatigue.  HENT: Negative for congestion, ear discharge and sinus pressure.   Eyes: Negative for discharge.  Respiratory: Negative for cough.   Cardiovascular: Negative for chest pain.  Gastrointestinal: Negative for abdominal pain and diarrhea.  Genitourinary: Negative for  frequency and hematuria.  Musculoskeletal: Negative for back pain.  Skin: Negative for rash.  Neurological: Positive for weakness. Negative for seizures and headaches.  Psychiatric/Behavioral: Negative for hallucinations.      Allergies  Review of patient's allergies indicates no known allergies.  Home Medications   Prior to Admission medications   Medication Sig Start Date End Date Taking? Authorizing Provider  albuterol (PROVENTIL HFA;VENTOLIN HFA) 108 (90 BASE) MCG/ACT inhaler Inhale 2 puffs into the lungs every 6 (six) hours as needed. For wheezing 03/02/11 03/11/14 Yes Biagio Borg, MD  alendronate (FOSAMAX) 70 MG tablet Take 70 mg by mouth once a week. Take with a full glass of water on an empty stomach. Takes on Monday's.   Yes Historical Provider, MD  amLODipine-benazepril (LOTREL) 5-20 MG per capsule Take 2 capsules by mouth daily.  03/02/11  Yes Biagio Borg, MD  aspirin 81 MG tablet Take 81 mg by mouth every morning.    Yes Historical Provider, MD  ferrous sulfate 325 (65 FE) MG tablet Take 325 mg by mouth daily with breakfast.   Yes Historical Provider, MD  hydrochlorothiazide (HYDRODIURIL) 25 MG tablet Take 25 mg by mouth daily. 03/02/11  Yes Biagio Borg, MD  lovastatin (MEVACOR) 20 MG tablet Take 40 mg by mouth at bedtime.   Yes Historical Provider, MD  metFORMIN (GLUCOPHAGE XR) 500 MG 24 hr tablet Take 1 tablet (500 mg total) by mouth daily with breakfast. 11/29/13  Yes Biagio Borg,  MD  Multiple Vitamin (MULTIVITAMIN WITH MINERALS) TABS tablet Take 1 tablet by mouth daily.   Yes Historical Provider, MD  potassium chloride (KLOR-CON) 10 MEQ CR tablet Take 20 mEq by mouth daily. 03/02/11  Yes Biagio Borg, MD  alendronate (FOSAMAX) 70 MG tablet TAKE ONE TABLET BY MOU TH EVERY 7 DAYS, TAKE WITH A FULL GLASS OF WATER ON AN EMPTY STOMACH 08/29/13   Biagio Borg, MD  amLODipine-benazepril (LOTREL) 5-20 MG per capsule TAKE TWO CAPSULES BY MOUTH ONCE DAILY Patient not taking: Reported  on 03/11/2014 08/29/13   Biagio Borg, MD  hydrochlorothiazide (HYDRODIURIL) 25 MG tablet TAKE ONE TABLET BY MOUTH ONCE DAILY 08/29/13   Biagio Borg, MD  lovastatin (MEVACOR) 20 MG tablet TAKE TWO TABLETS BY MOUTH AT BEDTIME Patient not taking: Reported on 03/11/2014 08/29/13   Biagio Borg, MD  NONFORMULARY OR COMPOUNDED ITEM Estradiol .02% 47ml prefilled applicator Sig: Apply one applicator twice a week 09/14/12   Terrance Mass, MD  phentermine 37.5 MG capsule Take 1 capsule (37.5 mg total) by mouth every morning. 09/19/12   Biagio Borg, MD  potassium chloride (K-DUR) 10 MEQ tablet Take 1 tablet (10 mEq total) by mouth 2 (two) times daily. 11/03/11 11/02/12  Ruthell Rummage Dammen, PA-C  potassium chloride (K-DUR) 10 MEQ tablet TAKE TWO TABLETS BY MOUTH ONCE DAILY 08/29/13   Biagio Borg, MD  VENTOLIN HFA 108 (90 BASE) MCG/ACT inhaler INHALE TWO PUFFS EVERY 6 HOURS AS NEEDED FOR WHEEZING 07/13/12   Biagio Borg, MD   BP 144/88 mmHg  Pulse 89  Temp(Src) 98.5 F (36.9 C) (Oral)  Resp 16  SpO2 99% Physical Exam  Constitutional: She is oriented to person, place, and time. She appears well-developed.  HENT:  Head: Normocephalic.  Eyes: Conjunctivae and EOM are normal. No scleral icterus.  Neck: Neck supple. No thyromegaly present.  Cardiovascular: Normal rate and regular rhythm.  Exam reveals no gallop and no friction rub.   No murmur heard. Pulmonary/Chest: No stridor. She has no wheezes. She has no rales. She exhibits no tenderness.  Abdominal: She exhibits no distension. There is no tenderness. There is no rebound.  Musculoskeletal: Normal range of motion. She exhibits no edema.  Lymphadenopathy:    She has no cervical adenopathy.  Neurological: She is oriented to person, place, and time. She exhibits normal muscle tone. Coordination normal.  Skin: No rash noted. No erythema.  Psychiatric: She has a normal mood and affect. Her behavior is normal.    ED Course  Procedures (including critical care  time) Labs Review Labs Reviewed  CBC WITH DIFFERENTIAL - Abnormal; Notable for the following:    MCV 75.9 (*)    MCH 24.5 (*)    All other components within normal limits  COMPREHENSIVE METABOLIC PANEL - Abnormal; Notable for the following:    Glucose, Bld 120 (*)    GFR calc non Af Amer 64 (*)    GFR calc Af Amer 74 (*)    Anion gap 16 (*)    All other components within normal limits  URINALYSIS, ROUTINE W REFLEX MICROSCOPIC - Abnormal; Notable for the following:    APPearance CLOUDY (*)    All other components within normal limits    Imaging Review Dg Chest 2 View  03/11/2014   CLINICAL DATA:  Cough.  EXAM: CHEST  2 VIEW  COMPARISON:  CT scan of Aug 24, 2013.  FINDINGS: Stable cardiomediastinal silhouette. Left hilar and suprahilar density  is noted concerning for neoplasm or malignancy. Probable right perihilar mass is noted as well. Right middle lobe nodular density is noted concerning for malignancy. No pneumothorax or significant pleural effusion is noted. Probable scarring is seen in left lung base. Bony thorax is intact.  IMPRESSION: Left hilar and suprahilar density is noted concerning for possible mass or neoplasm. Possible right hilar mass is noted as well, with probable right middle lobe nodule or mass. Similar findings were described on prior CT scan, but repeat CT scan of the chest with contrast administration is recommended to evaluate for progression and malignancy.   Electronically Signed   By: Sabino Dick M.D.   On: 03/11/2014 08:57   Ct Chest W Contrast  03/11/2014   CLINICAL DATA:  Lung mass.  EXAM: CT CHEST WITH CONTRAST  TECHNIQUE: Multidetector CT imaging of the chest was performed during intravenous contrast administration.  CONTRAST:  29mL OMNIPAQUE IOHEXOL 300 MG/ML  SOLN  COMPARISON:  CT scan of Aug 25, 2003.  FINDINGS: No pneumothorax or pleural effusion is noted. Scarring and bulla formation is noted in both lung apices. Focal opacity is noted along the mediastinal  border in the left upper lobe that measures 5.6 x 1.3 cm which may represent atelectasis, neoplasm or scarring. Oval-shaped mass measuring 2.5 x 1.7 cm is noted in the right lower lobe which is increased compared to prior exam and concerning for neoplasm. Spiculated density measuring 9 x 6 mm is noted in the right lung base just above the diaphragm. Stable subpleural nodule is seen anteriorly along the right upper lobe.  Enlarged precarinal lymph node measuring 18 x 13 mm is noted which is increased compared to prior exam 14 x 8 mm prevascular lymph node is also noted. These are concerning for metastatic disease. Coronary artery calcifications are noted. Visualized portion of upper abdomen is unremarkable. No significant osseous abnormality is noted. Atelectasis of the right middle lobe is noted concerning for endobronchial obstruction.  IMPRESSION: Opacity is seen in the left upper lobe along the mediastinal border concerning for atelectasis, neoplasm or scarring. Potentially this may represent endobronchial obstruction and bronchoscopy is recommended for further evaluation.  Also noted is atelectasis of the right middle lobe which may represent endobronchial obstruction. Bronchoscopy of this area is recommended is well.  Enlarged precarinal lymph node is noted concerning for metastatic disease. 2.5 x 1.7 cm mass is noted in the right lower lobe concerning for malignancy. 9 mm spiculated density is noted in right lung base just above the diaphragm. PET scan is recommended for further evaluation.   Electronically Signed   By: Sabino Dick M.D.   On: 03/11/2014 10:48     EKG Interpretation None      MDM   Final diagnoses:  Cough  Mass  Lung mass    Pt with lung mass,  Nontoxic,  Will follow up with her pcp tomorrow for tx of lung mass    Maudry Diego, MD 03/11/14 1357

## 2014-03-11 NOTE — ED Notes (Signed)
Patient transported to CT 

## 2014-03-11 NOTE — ED Notes (Signed)
MD at bedside. 

## 2014-03-11 NOTE — ED Notes (Signed)
Bed: WA04 Expected date:  Expected time:  Means of arrival:  Comments: grant

## 2014-03-11 NOTE — ED Notes (Signed)
Pt c/o bilateral flank pain, sore throat and generalized body aches x 2 days. Pt denies n/v and urinary problems.

## 2014-03-12 ENCOUNTER — Encounter: Payer: Self-pay | Admitting: Internal Medicine

## 2014-03-12 ENCOUNTER — Ambulatory Visit (INDEPENDENT_AMBULATORY_CARE_PROVIDER_SITE_OTHER): Payer: BC Managed Care – PPO | Admitting: Internal Medicine

## 2014-03-12 VITALS — BP 120/70 | HR 109 | Temp 98.4°F | Ht 64.0 in | Wt 188.5 lb

## 2014-03-12 DIAGNOSIS — R05 Cough: Secondary | ICD-10-CM

## 2014-03-12 DIAGNOSIS — I10 Essential (primary) hypertension: Secondary | ICD-10-CM

## 2014-03-12 DIAGNOSIS — R059 Cough, unspecified: Secondary | ICD-10-CM

## 2014-03-12 DIAGNOSIS — J984 Other disorders of lung: Secondary | ICD-10-CM

## 2014-03-12 DIAGNOSIS — R918 Other nonspecific abnormal finding of lung field: Secondary | ICD-10-CM | POA: Insufficient documentation

## 2014-03-12 DIAGNOSIS — E119 Type 2 diabetes mellitus without complications: Secondary | ICD-10-CM

## 2014-03-12 MED ORDER — BENZONATATE 100 MG PO CAPS: 100.0000 mg | ORAL_CAPSULE | Freq: Three times a day (TID) | ORAL | Status: DC | PRN

## 2014-03-12 NOTE — Assessment & Plan Note (Signed)
stable overall by history and exam, recent data reviewed with pt, and pt to continue medical treatment as before,  to f/u any worsening symptoms or concerns BP Readings from Last 3 Encounters:  03/12/14 120/70  03/11/14 138/70  11/28/13 132/78

## 2014-03-12 NOTE — Assessment & Plan Note (Signed)
Some improved, for tess perle, hold antibx for now with afeb, less cough and exam o/w benign

## 2014-03-12 NOTE — Assessment & Plan Note (Signed)
stable overall by history and exam, recent data reviewed with pt, and pt to continue medical treatment as before,  to f/u any worsening symptoms or concerns Lab Results  Component Value Date   HGBA1C 8.1* 11/29/2013

## 2014-03-12 NOTE — Progress Notes (Signed)
Subjective:    Patient ID: Deborah Neal, female    DOB: Sep 17, 1950, 63 y.o.   MRN: [id number]  HPI Here to f/u after seen in ER with cough, cxr then CT chest with new finding RLL lung mass, suspicious for malignancy. Cough has imrproved, scant productive, no fever, and Pt denies chest pain, increased sob or doe, wheezing, orthopnea, PND, increased LE swelling, palpitations, dizziness or syncope. Past Medical History  Diagnosis Date  . ALLERGIC RHINITIS 01/29/2007  . ASTHMA, WITH ACUTE EXACERBATION 06/24/2008  . ASTHMA 01/29/2007  . BACK PAIN 01/29/2009  . CHEST PAIN 06/24/2008  . HYPERLIPIDEMIA 01/29/2007  . HYPERTENSION 01/29/2007  . OSTEOPENIA 10/16/2007  . Type II or unspecified type diabetes mellitus without mention of complication, uncontrolled 11/29/2013   No past surgical history on file.  reports that she quit smoking about 10 years ago. She has never used smokeless tobacco. She reports that she does not drink alcohol or use illicit drugs. family history includes Cancer in her mother; Diabetes in her father; Heart disease in her father; Hypertension in her father. No Known Allergies Current Outpatient Prescriptions on File Prior to Visit  Medication Sig Dispense Refill  . alendronate (FOSAMAX) 70 MG tablet TAKE ONE TABLET BY MOU TH EVERY 7 DAYS, TAKE WITH A FULL GLASS OF WATER ON AN EMPTY STOMACH 12 tablet 3  . alendronate (FOSAMAX) 70 MG tablet Take 70 mg by mouth once a week. Take with a full glass of water on an empty stomach. Takes on Monday's.    Marland Kitchen amLODipine-benazepril (LOTREL) 5-20 MG per capsule Take 2 capsules by mouth daily.     Marland Kitchen amLODipine-benazepril (LOTREL) 5-20 MG per capsule TAKE TWO CAPSULES BY MOUTH ONCE DAILY 180 capsule 3  . aspirin 81 MG tablet Take 81 mg by mouth every morning.     . ferrous sulfate 325 (65 FE) MG tablet Take 325 mg by mouth daily with breakfast.    . hydrochlorothiazide (HYDRODIURIL) 25 MG tablet Take 25 mg by mouth daily.    . hydrochlorothiazide  (HYDRODIURIL) 25 MG tablet TAKE ONE TABLET BY MOUTH ONCE DAILY 90 tablet 3  . lovastatin (MEVACOR) 20 MG tablet TAKE TWO TABLETS BY MOUTH AT BEDTIME 180 tablet 3  . lovastatin (MEVACOR) 20 MG tablet Take 40 mg by mouth at bedtime.    . metFORMIN (GLUCOPHAGE XR) 500 MG 24 hr tablet Take 1 tablet (500 mg total) by mouth daily with breakfast. 90 tablet 3  . Multiple Vitamin (MULTIVITAMIN WITH MINERALS) TABS tablet Take 1 tablet by mouth daily.    . NONFORMULARY OR COMPOUNDED ITEM Estradiol .02% 25ml prefilled applicator Sig: Apply one applicator twice a week 90 each 4  . phentermine 37.5 MG capsule Take 1 capsule (37.5 mg total) by mouth every morning. 90 capsule 0  . potassium chloride (K-DUR) 10 MEQ tablet TAKE TWO TABLETS BY MOUTH ONCE DAILY 180 tablet 3  . potassium chloride (KLOR-CON) 10 MEQ CR tablet Take 20 mEq by mouth daily.    . VENTOLIN HFA 108 (90 BASE) MCG/ACT inhaler INHALE TWO PUFFS EVERY 6 HOURS AS NEEDED FOR WHEEZING 18 each 3  . albuterol (PROVENTIL HFA;VENTOLIN HFA) 108 (90 BASE) MCG/ACT inhaler Inhale 2 puffs into the lungs every 6 (six) hours as needed. For wheezing    . potassium chloride (K-DUR) 10 MEQ tablet Take 1 tablet (10 mEq total) by mouth 2 (two) times daily. 20 tablet 0   No current facility-administered medications on file prior to visit.  Review of Systems  Constitutional: Negative for unusual diaphoresis or other sweats  HENT: Negative for ringing in ear Eyes: Negative for double vision or worsening visual disturbance.  Respiratory: Negative for choking and stridor.   Gastrointestinal: Negative for vomiting or other signifcant bowel change Genitourinary: Negative for hematuria or decreased urine volume.  Musculoskeletal: Negative for other MSK pain or swelling Skin: Negative for color change and worsening wound.  Neurological: Negative for tremors and numbness other than noted  Psychiatric/Behavioral: Negative for decreased concentration or agitation other  than above       Objective:   Physical Exam BP 120/70 mmHg  Pulse 109  Temp(Src) 98.4 F (36.9 C) (Oral)  Ht 5\' 4"  (1.626 m)  Wt 188 lb 8 oz (85.503 kg)  BMI 32.34 kg/m2  SpO2 91% VS noted,  Constitutional: Pt appears well-developed, well-nourished.  HENT: Head: NCAT.  Right Ear: External ear normal.  Left Ear: External ear normal.  Eyes: . Pupils are equal, round, and reactive to light. Conjunctivae and EOM are normal Neck: Normal range of motion. Neck supple.  Cardiovascular: Normal rate and regular rhythm.   Pulmonary/Chest: Effort normal and breath sounds normal.  Neurological: Pt is alert. Not confused , motor grossly intact Skin: Skin is warm. No rash Psychiatric: Pt behavior is normal. No agitation.     Assessment & Plan:

## 2014-03-12 NOTE — Progress Notes (Signed)
Pre visit review using our clinic review tool, if applicable. No additional management support is needed unless otherwise documented below in the visit note. 

## 2014-03-12 NOTE — Assessment & Plan Note (Signed)
suspicous for malignancy, for pulm referrral urgent, consdier PETscan

## 2014-03-12 NOTE — Patient Instructions (Addendum)
Please take all new medication as prescribed - the cough pills  Please continue all other medications as before, and refills have been done if requested.  Please have the pharmacy call with any other refills you may need.  Please continue your efforts at being more active, low cholesterol diet, and weight control.  Please keep your appointments with your specialists as you may have planned  You will be contacted regarding the referral for: pulmonary  Please return in 6 months, or sooner if needed, with Lab testing done 3-5 days before

## 2014-03-14 ENCOUNTER — Telehealth: Payer: Self-pay | Admitting: Internal Medicine

## 2014-03-14 NOTE — Telephone Encounter (Signed)
emmi mailed  °

## 2014-03-15 ENCOUNTER — Encounter: Payer: Self-pay | Admitting: Internal Medicine

## 2014-03-19 ENCOUNTER — Emergency Department (HOSPITAL_COMMUNITY)
Admission: EM | Admit: 2014-03-19 | Discharge: 2014-03-19 | Disposition: A | Discharge status: Home / Self Care | Payer: BC Managed Care – PPO | Attending: Emergency Medicine | Admitting: Emergency Medicine

## 2014-03-19 ENCOUNTER — Encounter (HOSPITAL_COMMUNITY): Payer: Self-pay | Admitting: Emergency Medicine

## 2014-03-19 ENCOUNTER — Emergency Department (HOSPITAL_COMMUNITY): Payer: BC Managed Care – PPO

## 2014-03-19 DIAGNOSIS — Y92410 Unspecified street and highway as the place of occurrence of the external cause: Secondary | ICD-10-CM | POA: Insufficient documentation

## 2014-03-19 DIAGNOSIS — S199XXA Unspecified injury of neck, initial encounter: Secondary | ICD-10-CM | POA: Insufficient documentation

## 2014-03-19 DIAGNOSIS — S46911A Strain of unspecified muscle, fascia and tendon at shoulder and upper arm level, right arm, initial encounter: Secondary | ICD-10-CM | POA: Diagnosis not present

## 2014-03-19 DIAGNOSIS — Z87891 Personal history of nicotine dependence: Secondary | ICD-10-CM | POA: Insufficient documentation

## 2014-03-19 DIAGNOSIS — Y9389 Activity, other specified: Secondary | ICD-10-CM | POA: Diagnosis not present

## 2014-03-19 DIAGNOSIS — T148XXA Other injury of unspecified body region, initial encounter: Secondary | ICD-10-CM

## 2014-03-19 DIAGNOSIS — J45909 Unspecified asthma, uncomplicated: Secondary | ICD-10-CM | POA: Diagnosis not present

## 2014-03-19 DIAGNOSIS — Z7982 Long term (current) use of aspirin: Secondary | ICD-10-CM | POA: Diagnosis not present

## 2014-03-19 DIAGNOSIS — M542 Cervicalgia: Secondary | ICD-10-CM

## 2014-03-19 DIAGNOSIS — Y998 Other external cause status: Secondary | ICD-10-CM | POA: Diagnosis not present

## 2014-03-19 DIAGNOSIS — E785 Hyperlipidemia, unspecified: Secondary | ICD-10-CM | POA: Insufficient documentation

## 2014-03-19 DIAGNOSIS — Z79899 Other long term (current) drug therapy: Secondary | ICD-10-CM | POA: Diagnosis not present

## 2014-03-19 DIAGNOSIS — Z8739 Personal history of other diseases of the musculoskeletal system and connective tissue: Secondary | ICD-10-CM | POA: Diagnosis not present

## 2014-03-19 DIAGNOSIS — E119 Type 2 diabetes mellitus without complications: Secondary | ICD-10-CM | POA: Insufficient documentation

## 2014-03-19 DIAGNOSIS — I10 Essential (primary) hypertension: Secondary | ICD-10-CM | POA: Insufficient documentation

## 2014-03-19 MED ORDER — HYDROCODONE-ACETAMINOPHEN 5-325 MG PO TABS: ORAL_TABLET | ORAL | Status: DC

## 2014-03-19 MED ORDER — HYDROCODONE-ACETAMINOPHEN 5-325 MG PO TABS
1.0000 | ORAL_TABLET | Freq: Once | ORAL | Status: AC
  Administered 2014-03-19: 1 via ORAL
  Filled 2014-03-19: qty 1

## 2014-03-19 MED ORDER — METHOCARBAMOL 500 MG PO TABS: 500.0000 mg | ORAL_TABLET | Freq: Two times a day (BID) | ORAL | Status: DC | PRN

## 2014-03-19 NOTE — ED Notes (Signed)
Bed: [facility] Expected date:  Expected time:  Means of arrival:  Comments: [facility] 

## 2014-03-19 NOTE — ED Notes (Signed)
Cervical collar removed by EDP.
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
## 2014-03-19 NOTE — Discharge Instructions (Signed)
°Emergency Department Resource Guide °1) Find a Doctor and Pay Out of Pocket °Although you won't have to find out who is covered by your insurance plan, it is a good idea to ask around and get recommendations. You will then need to call the office and see if the doctor you have chosen will accept you as a new patient and what types of options they offer for patients who are self-pay. Some doctors offer discounts or will set up payment plans for their patients who do not have insurance, but you will need to ask so you aren't surprised when you get to your appointment. ° °2) Contact Your Local Health Department °Not all health departments have doctors that can see patients for sick visits, but many do, so it is worth a call to see if yours does. If you don't know where your local health department is, you can check in your phone book. The CDC also has a tool to help you locate your state's health department, and many state websites also have listings of all of their local health departments. ° °3) Find a Walk-in Clinic °If your illness is not likely to be very severe or complicated, you may want to try a walk in clinic. These are popping up all over the country in pharmacies, drugstores, and shopping centers. They're usually staffed by nurse practitioners or physician assistants that have been trained to treat common illnesses and complaints. They're usually fairly quick and inexpensive. However, if you have serious medical issues or chronic medical problems, these are probably not your best option. ° °No Primary Care Doctor: °- Call [facility] at  832-8000 - they can help you locate a primary care doctor that  accepts your insurance, provides certain services, etc. °- Physician Referral Service- 1-800-533-3463 ° °Chronic Pain Problems: °Organization         Address  Phone   Notes  °[facility]  (336) 297-2271 Patients need to be referred by their primary care doctor.  ° °Medication  Assistance: °Organization         Address  Phone   Notes  °[place] [facility] [address] °[place], [place] [address] (336) 641-8030 --Must be a resident of [place] °-- Must have NO insurance coverage whatsoever (no Medicaid/ Medicare, etc.) °-- The pt. MUST have a primary care doctor that directs their care regularly and follows them in the community °  °[facility]  (866) 331-1348   °[employer]  (888) 892-1162   ° °Agencies that provide inexpensive medical care: °Organization         Address  Phone   Notes  ° Family Medicine  (336) 832-8035   ° Internal Medicine    (336) 832-7272   °[facility] [address] °[place], [place] [address] (336) 832-4777   °[facility] [address], °[place] (336) 271-4999   °[facility]    (336) 373-0678   °[facility]    (336) 272-1050   °[facility] ° [address], [place] Phone:  (336) 832-4444, Fax:  (336) 832-4440 Hours of Operation:  9 am - 6 pm, M-F.  Also accepts Medicaid/Medicare and self-pay.  °[facility] ° [address], [place] Phone: (336) 832-3150, Fax: (336) 832-3151. Hours of Operation:  8:30 am - 5:30 pm, M-F.  Also accepts Medicaid and self-pay.  °[facility] [place] [address], [place] Phone: (336) 878-6027   °[facility] [address], [place], [place] (336)723-1848, Ext. 123 Mondays & Thursdays: 7-9 AM.  First 15 patients are seen on a first come, first serve basis. °  ° °Medicaid-accepting [place] Providers: ° °Organization         Address  Phone   Notes  °[facility] [address], [place] (336) 641-2100 Also accepts self-pay patients.  °[facility] [address], [place] ° (336) 856-9996   °[facility] [address], [place]  (336) 288-8857   °[facility] [address] [place] [address], [place] (336) 299-7000   °Veita Bland [address], [place]  ° (336) 373-1557 Only accepts [place] Access Medicaid patients after they have their name applied to their card.  ° °Self-Pay (no insurance) in [place]: ° °Organization         Address  Phone   Notes  °Sickle Cell Patients, [facility] [address], [place] (336) 832-1970   °[facility] [address], [place] (336) 832-4400   °[facility] ° [address],  (336) 992-4800   °[facility]/Dr. Osei-Bonsu ° [address] [place] [address], [place] or [address], [place] (336) 841-8500 Phone number for both [place] and [place] locations is the same.  °[facility] [address], [place] (336) 299-0000   °[facility] [address] [place] [address], [place] or [address] [place] [address] (336) 852-7530 °(336) 878-2260   °[facility] [address], [place] (336) 350-1642, phone; (336) 294-5005, fax Sees patients 1st and 3rd Saturday of every month.  Must not qualify for public or private insurance (i.e. Medicaid, Medicare, [place] Health Choice, Veterans' Benefits) • Household income should be no more than 200% of the poverty level •The clinic cannot treat you if you are pregnant or think you are pregnant • Sexually transmitted diseases are not treated at the clinic.  ° ° °Dental Care: °Organization         Address  Phone  Notes  °[place] [facility] [address], [place] (336) 641-6152 Accepts children up to age 21 who are enrolled in Medicaid or [place] Health Choice; pregnant women with a Medicaid card; and children who have applied for Medicaid or [place] Health Choice, but were declined, whose parents can pay a reduced fee at time of service.  °[place]  [facility] [place]  [address], [place] (336) 641-7733 Accepts children up to age 21 who are enrolled in Medicaid or [place] Health Choice; pregnant women with a Medicaid card; and children who have applied for Medicaid or [place] Health Choice, but were declined, whose parents can pay a reduced fee at time of service.  °[facility] ° [address],  (336) 641-4533 Patients are seen by appointment only. Walk-ins are not accepted. [facility] will see patients 18 years of age and older. °Monday - Tuesday (8am-5pm) °Most Wednesdays (8:30-5pm) °$30 per visit, cash only  °[facility] ° [address], [place] (336) 641-4533 Patients are seen by appointment only. Walk-ins are not accepted. [facility] will see patients 18 years of age and older. °One   Wednesday Evening (Monthly: Volunteer Based).  $30 per visit, cash only  °[facility]  (919) 537-3737 for adults; Children under age 4, call Graduate Pediatric Dentistry at ([phone]) [phone]. Children aged 4-14, please call (919) [phone] to request a pediatric application. ° Dental services are provided in all areas of dental care including fillings, crowns and bridges, complete and partial dentures, implants, gum treatment, root canals, and extractions. Preventive care is also provided. Treatment is provided to both adults and children. °Patients are selected via a lottery and there is often a waiting list. °  °[facility] [address], °[place] ° (336) 763-8833 [email or web address] °  °[facility] [address], [place], [place] (336)723-1848, Ext. 123 Second and Fourth Thursday of each month, opens at 6:30 AM; Clinic ends at 9 AM.  Patients are seen on a first-come first-served basis, and a limited number are seen during each clinic.  ° °[facility] ° [address], [place], [place] (336) 723-7904    Eligibility Requirements °You must have lived in [place], or [place] counties for at least the last three months. °  You cannot be eligible for state or federal sponsored healthcare insurance, including Veterans Administration, Medicaid, or Medicare. °  You generally cannot be eligible for healthcare insurance through your employer.  °  How to apply: °Eligibility screenings are held every Tuesday and Wednesday afternoon from 1:00 pm until 4:00 pm. You do not need an appointment for the interview!  °[facility] [address], [place], [place] 336-631-2330   °[facility] [facility]  336-342-8273   °[facility]  336-703-3100   °[facility]  336-570-6415   ° °Behavioral Health Resources in the Community: °Intensive Outpatient Programs °Organization         Address  Phone  Notes  °[facility] [address], [place], [place] 336-878-6098   °[facility] [address], [place], [place] 336-832-9800   °[facility] [address], [place], [place] ° 336-882-2125   °[facility] [address],  °[place], [place] 1-800-853-5163 or 336-641-4981   °Substance Abuse Resources °Organization         Address  Phone  Notes  °[facility]  336-882-2125   °[facility]  336-784-9470   °[facility]  336-285-9073   °[facility]  336-845-3988   °Residential & Outpatient Substance Abuse Program  1-800-659-3381   °Psychological Services °Organization         Address  Phone  Notes  °[facility]  336- 832-9600   °[facility]  336- 378-7881   °[facility] [address], [place] 1-800-853-5163 or 336-641-4981   ° °Mobile Crisis Teams °Organization         Address  Phone  Notes  °[facility], Mobile Crisis Care Unit  1-877-626-1772   °Assertive °[facility] ° [address]  [place], [place] 336-834-9664   °Sharon DeEsch [address] °[place]  336-554-5454   ° °Self-Help/Support Groups °Organization         Address  Phone             Notes  °[facility] [place] [facility] - variety of support groups  336- 373-1402 Call for more information  °Narcotics Anonymous (NA), [facility] [address], °[place]   2 meetings at this location  ° °  Residential Treatment Programs °Organization         Address  Phone  Notes  °[facility] [address],    °[place]  1-866-801-8205   °[facility] ° [address], [place], [place] 704-293-8524   °[facility] [address], [place] 336-845-3988 Admissions: 8am-3pm M-F  °[facility] [address]    °[place], [place] 336-841-1104   °[facility] [address], [place], [place] 336-379-7146   °[facility] [address]  °[place], [place] 336-285-9073   °[facility] - Intensive Outpatient [address], [place], [place] 336-852-3033   °[facility] [address]  °[place], [place] 1-877-615-2722 or 336-784-9470   °[facility] [address] [place], [place] 336-227-7417 Accepts Medicaid  °[facility] [address]  °[place] 1-800-659-3381 Substance Abuse/Addiction Treatment  ° °[place] Behavioral Health Resources °Organization         Address  Phone  Notes  °[facility]  (888) 581-9988   °Julie Brannon, PhD [address] [place], [place]   (336) 349-5553 or (336) 951-0000   °[facility]   [address] °[place], [place] (336) 349-4454   °[facility] [address], [place], [place] (336) 342-8316 Insurance/Medicaid/sponsorship through [facility]  °[facility] [address]                                    [place], [place] (336) 342-8316 Therapy/tele-psych/case    °[facility] [address]  ° [place], [place] (336) 349-2233    °Dr. Arfeen  (336) 349-4544   °[facility] [place]  [employer] [place] [facility] 1) [address],  °2) [address], [place] °3)  [address], [place] (336) 349-3220 °(336) 342-7768 ° °(336) 342-8140   °[place] [facility] (336) 342-1394 or (336) 342-3537 (After Hours)    ° ° ° °Take the prescriptions as directed.  Apply moist heat or ice to the area(s) of discomfort, for 15 minutes at a time, several times per day for the next few days.  Do not fall asleep on a heating or ice pack.  Call your regular medical doctor today to schedule a follow up appointment this week.  Return to the Emergency Department immediately if worsening. ° °

## 2014-03-25 ENCOUNTER — Ambulatory Visit (INDEPENDENT_AMBULATORY_CARE_PROVIDER_SITE_OTHER): Payer: BC Managed Care – PPO | Admitting: Internal Medicine

## 2014-03-25 ENCOUNTER — Encounter: Payer: Self-pay | Admitting: Internal Medicine

## 2014-03-25 ENCOUNTER — Encounter: Payer: Self-pay | Admitting: *Deleted

## 2014-03-25 VITALS — BP 102/60 | HR 90 | Ht 63.0 in | Wt 190.0 lb

## 2014-03-25 DIAGNOSIS — J9811 Atelectasis: Secondary | ICD-10-CM

## 2014-03-25 NOTE — Assessment & Plan Note (Addendum)
See CT chest 03/11/14 vs 2005    Unusual hx dating back at least 10 years with suspicious LUL process more than likley benign such as related to remote granlomatous process like histo exposure but since she is a smoker needs fob to be sure the LUL and RML orifices are patent and no neoplastic process present in this setting Discussed in detail all the  indications, usual  risks and alternatives  relative to the benefits with patient who agrees to proceed with bronchoscopy with biopsy if indicated  See instructions for specific recommendations which were reviewed directly with the patient who was given a copy with highlighter outlining the key components.

## 2014-03-25 NOTE — Patient Instructions (Signed)
Come to outpatient registration at [facility] (behind the ER) at 67 am Thursday 03/28/14  with nothing to eat or drink after midnight Wednesday for Bronchoscopy   No aspirin in meantime

## 2014-03-25 NOTE — Progress Notes (Signed)
Subjective:    Patient ID: Deborah Neal, female    DOB: Dec 09, 1950  MRN: [id number]  HPI  51 yobf quit smoking 2005 p acute illness dx as pna with RML >> LUL scarring noted on cxr and did fine clinically  until early Nov 2015 onset of of midline ant chest pain/ dry cough with abnormal cxr  So CT done and pt referred to pulmonary clinic 03/25/2014 by Dr Jenny Reichmann for ? Lung mass  03/25/2014 1st [facility] office visit/ Kayia Billinger   Chief Complaint  Patient presents with  . Pulmonary Consult    Referred by Dr. [name] for eval of lung mass. Pt c/o CP, SOB and cough for the past month.   cp/ cough gone p rx with tessalon/ no abx  But chronic doe x fast walk / appetite has recovered - did report onset 03/09/14  sore throat a  and aching but no fever   No obvious other patterns in day to day or daytime variabilty or assoc chronic cough or lateralizing cp or chest tightness, subjective wheeze overt or sinus   symptoms. No unusual exp hx or h/o childhood pna/ asthma or knowledge of premature birth.  Sleeping ok without nocturnal  or early am exacerbation  of respiratory  c/o's or need for noct saba. Also denies any obvious fluctuation of symptoms with weather or environmental changes or other aggravating or alleviating factors except as outlined above   Current Medications, Allergies, Complete Past Medical History, Past Surgical History, Family History, and Social History were reviewed in [employer] record.            Review of Systems  Constitutional: Positive for appetite change. Negative for fever, chills and unexpected weight change.  HENT: Negative for congestion, dental problem, ear pain, nosebleeds, postnasal drip, rhinorrhea, sinus pressure, sneezing, sore throat, trouble swallowing and voice change.   Eyes: Negative for visual disturbance.  Respiratory: Positive for cough and shortness of breath. Negative for choking.   Cardiovascular: Positive for chest pain.  Negative for leg swelling.  Gastrointestinal: Negative for vomiting, abdominal pain and diarrhea.  Genitourinary: Negative for difficulty urinating.       Heartburn Indigestion  Musculoskeletal: Negative for arthralgias.  Skin: Negative for rash.  Neurological: Negative for tremors, syncope and headaches.  Hematological: Does not bruise/bleed easily.       Objective:   Physical Exam  Wt Readings from Last 3 Encounters:  03/25/14 190 lb (86.183 kg)  03/12/14 188 lb 8 oz (85.503 kg)  11/28/13 191 lb (86.637 kg)    Vital signs reviewed   HEENT: nl dentition, turbinates, and orophanx. Nl external ear canals without cough reflex   NECK :  without JVD/Nodes/TM/ nl carotid upstrokes bilaterally   LUNGS: no acc muscle use, clear to A and P bilaterally without cough on insp or exp maneuvers   CV:  RRR  no s3 or murmur or increase in P2, no edema   ABD:  soft and nontender with nl excursion in the supine position. No bruits or organomegaly, bowel sounds nl  MS:  warm without deformities, calf tenderness, cyanosis or clubbing  SKIN: warm and dry without lesions    NEURO:  alert, approp, no deficits  CT chest  03/11/14 concerning for atelectasis, neoplasm or scarring. Potentially this may represent endobronchial obstruction and bronchoscopy is recommended for further evaluation.  Also noted is atelectasis of the right middle lobe which may represent endobronchial obstruction. Bronchoscopy of this area is recommended  is well.  Enlarged precarinal lymph node is noted concerning for metastatic disease. 2.5 x 1.7 cm mass is noted in the right lower lobe concerning for malignancy. 9 mm spiculated density is noted in right lung base just above the diaphragm. PET scan is recommended for further evaluation.   Vs CT 08/25/2003 Left hilar mass extending into the left upper lobe worrisome for primary lung carcinoma. There are nodules throughout both lungs suspicious for lung  metastases. Opacity within right middle lobe and to a lesser degree, right lower lobe appears more typical of atelectasis and may be due to compression by adjacent nodes.          Assessment & Plan:

## 2014-03-28 ENCOUNTER — Encounter (HOSPITAL_COMMUNITY)
Admission: RE | Disposition: A | Discharge status: Home / Self Care | Payer: BC Managed Care – PPO | Source: Ambulatory Visit | Attending: Internal Medicine

## 2014-03-28 ENCOUNTER — Ambulatory Visit (HOSPITAL_COMMUNITY)
Admission: RE | Admit: 2014-03-28 | Discharge: 2014-03-28 | Disposition: A | Discharge status: Home / Self Care | Payer: BC Managed Care – PPO | Source: Ambulatory Visit | Attending: Internal Medicine | Admitting: Internal Medicine

## 2014-03-28 DIAGNOSIS — E669 Obesity, unspecified: Secondary | ICD-10-CM | POA: Diagnosis not present

## 2014-03-28 DIAGNOSIS — E119 Type 2 diabetes mellitus without complications: Secondary | ICD-10-CM | POA: Diagnosis not present

## 2014-03-28 DIAGNOSIS — J9811 Atelectasis: Secondary | ICD-10-CM

## 2014-03-28 DIAGNOSIS — R05 Cough: Secondary | ICD-10-CM | POA: Insufficient documentation

## 2014-03-28 DIAGNOSIS — M722 Plantar fascial fibromatosis: Secondary | ICD-10-CM | POA: Insufficient documentation

## 2014-03-28 DIAGNOSIS — R0602 Shortness of breath: Secondary | ICD-10-CM | POA: Diagnosis not present

## 2014-03-28 DIAGNOSIS — M8580 Other specified disorders of bone density and structure, unspecified site: Secondary | ICD-10-CM | POA: Diagnosis not present

## 2014-03-28 DIAGNOSIS — E785 Hyperlipidemia, unspecified: Secondary | ICD-10-CM | POA: Insufficient documentation

## 2014-03-28 DIAGNOSIS — Z87891 Personal history of nicotine dependence: Secondary | ICD-10-CM | POA: Diagnosis not present

## 2014-03-28 DIAGNOSIS — J45909 Unspecified asthma, uncomplicated: Secondary | ICD-10-CM | POA: Insufficient documentation

## 2014-03-28 DIAGNOSIS — I10 Essential (primary) hypertension: Secondary | ICD-10-CM | POA: Insufficient documentation

## 2014-03-28 HISTORY — PX: VIDEO BRONCHOSCOPY: SHX5072

## 2014-03-28 LAB — GLUCOSE, CAPILLARY: Glucose-Capillary: 132 mg/dL — ABNORMAL HIGH (ref 70–99)

## 2014-03-28 SURGERY — VIDEO BRONCHOSCOPY WITHOUT FLUORO
Anesthesia: Moderate Sedation | Laterality: Bilateral

## 2014-03-28 MED ORDER — LIDOCAINE HCL 2 % EX GEL
CUTANEOUS | Status: DC | PRN
  Administered 2014-03-28: 1

## 2014-03-28 MED ORDER — MIDAZOLAM HCL 10 MG/2ML IJ SOLN
INTRAMUSCULAR | Status: AC
  Filled 2014-03-28: qty 4

## 2014-03-28 MED ORDER — LIDOCAINE HCL 2 % EX GEL: 1.0000 "application " | Freq: Once | CUTANEOUS | Status: DC

## 2014-03-28 MED ORDER — LIDOCAINE HCL 1 % IJ SOLN
INTRAMUSCULAR | Status: DC | PRN
Start: 2014-03-28 — End: 2014-03-28
  Administered 2014-03-28: 6 mL via RESPIRATORY_TRACT

## 2014-03-28 MED ORDER — MEPERIDINE HCL 25 MG/ML IJ SOLN
INTRAMUSCULAR | Status: DC | PRN
  Administered 2014-03-28: 25 mg via INTRAVENOUS

## 2014-03-28 MED ORDER — MEPERIDINE HCL 100 MG/ML IJ SOLN
INTRAMUSCULAR | Status: AC
  Filled 2014-03-28: qty 2

## 2014-03-28 MED ORDER — PHENYLEPHRINE HCL 0.25 % NA SOLN
NASAL | Status: DC | PRN
  Administered 2014-03-28: 2 via NASAL

## 2014-03-28 MED ORDER — MIDAZOLAM HCL 10 MG/2ML IJ SOLN
INTRAMUSCULAR | Status: DC | PRN
  Administered 2014-03-28 (×2): 2.5 mg via INTRAVENOUS

## 2014-03-28 MED ORDER — PHENYLEPHRINE HCL 0.25 % NA SOLN: 1.0000 | Freq: Four times a day (QID) | NASAL | Status: DC | PRN

## 2014-03-28 MED ORDER — MEPERIDINE HCL 100 MG/ML IJ SOLN: 100.0000 mg | Freq: Once | INTRAMUSCULAR | Status: DC

## 2014-03-28 MED ORDER — MIDAZOLAM HCL 10 MG/2ML IJ SOLN: 1.0000 mg | Freq: Once | INTRAMUSCULAR | Status: DC

## 2014-03-28 MED ORDER — SODIUM CHLORIDE 0.9 % IV SOLN
INTRAVENOUS | Status: DC
  Administered 2014-03-28: 08:00:00 via INTRAVENOUS

## 2014-03-28 NOTE — Op Note (Signed)
Bronchoscopy Procedure Note  Date of Operation: 03/28/2014   Pre-op Diagnosis: atx multiple lobes  Post-op Diagnosis: same  Surgeon: Christinia Gully  Anesthesia: Monitored Local Anesthesia with Sedation  Operation: Video Flexible fiberoptic bronchoscopy, diagnostic   Findings: multiple "bling pouches RUL, RML, Lingula, with tiny opening to LUL AP segment   Specimen: BAL LUL  Estimated Blood Loss: none   Complications: none  Indications and History: See updated H and P same date. The risks, benefits, complications, treatment options and expected outcomes were discussed with the patient.  The possibilities of reaction to medication, pulmonary aspiration, perforation of a viscus, bleeding, failure to diagnose a condition and creating a complication requiring transfusion or operation were discussed with the patient who freely signed the consent.    Description of Procedure: The patient was re-examined in the bronchoscopy suite and the site of surgery properly noted/marked.  The patient was identified  and the procedure verified as Flexible Fiberoptic Bronchoscopy.  A Time Out was held and the above information confirmed.   After the induction of topical nasopharyngeal anesthesia, the patient was positioned  and the bronchoscope was passed through the R naris. The vocal cords were visualized and  1% buffered lidocaine 5 ml was topically placed onto the cords. The cords were nl. The scope was then passed into the trachea.  1% buffered lidocaine given topically. Airways inspected bilaterally to the subsegmental level with the following findings:  Tracheal and all the main  bronchi widely patent and unrmarkable  multiple "bling pouches in segmental orifices:  RUL anteriror, RML, Lingula, with tiny opening to LUL AP segment  Intervention: BAL LUL for cytol afb and fungal smears      The Patient was taken to the Endoscopy Recovery area in satisfactory condition.  Attestation: I performed  the procedure.  Christinia Gully, MD Pulmonary and [place] [phone] After 5:30 PM or weekends, call [phone]

## 2014-03-28 NOTE — Progress Notes (Signed)
Video bronchoscopy performed Intervention bronchial washings Pt tolerated well  Kathie Dike

## 2014-03-28 NOTE — H&P (Signed)
Patient ID: Deborah Neal, female DOB: 1950/12/26 MRN: [id number]  HPI  45 yobf quit smoking 2005 p acute illness dx as pna with RML >> LUL scarring noted on cxr and did fine clinically until early Nov 2015 onset of of midline ant chest pain/ dry cough with abnormal cxr So CT done and pt referred to pulmonary clinic 03/25/2014 by Dr Jenny Reichmann for ? Lung mass  03/25/2014 1st [facility] office visit/ Zinia Innocent  Chief Complaint  Patient presents with  . Pulmonary Consult    Referred by Dr. [name] for eval of lung mass. Pt c/o CP, SOB and cough for the past month.   cp/ cough gone p rx with tessalon/ no abx But chronic doe x fast walk / appetite has recovered - did report onset 03/09/14 sore throat a and aching but no fever   No obvious other patterns in day to day or daytime variabilty or assoc chronic cough or lateralizing cp or chest tightness, subjective wheeze overt or sinus symptoms. No unusual exp hx or h/o childhood pna/ asthma or knowledge of premature birth.  Sleeping ok without nocturnal or early am exacerbation of respiratory c/o's or need for noct saba. Also denies any obvious fluctuation of symptoms with weather or environmental changes or other aggravating or alleviating factors except as outlined above   Current Medications, Allergies, Complete Past Medical History, Past Surgical History, Family History, and Social History were reviewed in [employer] record.       Review of Systems  Constitutional: Positive for appetite change. Negative for fever, chills and unexpected weight change.  HENT: Negative for congestion, dental problem, ear pain, nosebleeds, postnasal drip, rhinorrhea, sinus pressure, sneezing, sore throat, trouble swallowing and voice change.  Eyes: Negative for visual disturbance.  Respiratory: Positive for cough and shortness of breath. Negative for choking.  Cardiovascular: Positive for chest pain. Negative for  leg swelling.  Gastrointestinal: Negative for vomiting, abdominal pain and diarrhea.  Genitourinary: Negative for difficulty urinating.   Heartburn Indigestion  Musculoskeletal: Negative for arthralgias.  Skin: Negative for rash.  Neurological: Negative for tremors, syncope and headaches.  Hematological: Does not bruise/bleed easily.       Objective:   Physical Exam  Wt Readings from Last 3 Encounters:  03/25/14 190 lb (86.183 kg)  03/12/14 188 lb 8 oz (85.503 kg)  11/28/13 191 lb (86.637 kg)    Vital signs reviewed   HEENT: nl dentition, turbinates, and orophanx. Nl external ear canals without cough reflex   NECK : without JVD/Nodes/TM/ nl carotid upstrokes bilaterally   LUNGS: no acc muscle use, clear to A and P bilaterally without cough on insp or exp maneuvers   CV: RRR no s3 or murmur or increase in P2, no edema   ABD: soft and nontender with nl excursion in the supine position. No bruits or organomegaly, bowel sounds nl  MS: warm without deformities, calf tenderness, cyanosis or clubbing  SKIN: warm and dry without lesions   NEURO: alert, approp, no deficits  CT chest 03/11/14 concerning for atelectasis, neoplasm or scarring. Potentially this may represent endobronchial obstruction and bronchoscopy is recommended for further evaluation.  Also noted is atelectasis of the right middle lobe which may represent endobronchial obstruction. Bronchoscopy of this area is recommended is well.  Enlarged precarinal lymph node is noted concerning for metastatic disease. 2.5 x 1.7 cm mass is noted in the right lower lobe concerning for malignancy. 9 mm spiculated density is noted in right lung base just  above the diaphragm. PET scan is recommended for further evaluation.   Vs CT 08/25/2003 Left hilar mass extending into the left upper lobe worrisome for primary lung carcinoma. There are nodules throughout both lungs suspicious for lung  metastases. Opacity within right middle lobe and to a lesser degree, right lower lobe appears more typical of atelectasis and may be due to compression by adjacent nodes.         Assessment & Plan:                    Atelectasis - Tanda Rockers, MD at 03/25/2014 3:41 PM     Status: Alison Stalling Related Problem: Atelectasis   Expand All Collapse All   See CT chest 03/11/14 vs 2005   Unusual hx dating back at least 10 years with suspicious LUL process more than likley benign such as related to remote granlomatous process like histo exposure but since she is a smoker needs fob to be sure the LUL and RML orifices are patent and no neoplastic process present in this setting Discussed in detail all the indications, usual risks and alternatives relative to the benefits with patient who agrees to proceed with bronchoscopy with biopsy if indicated  See instructions for specific recommendations which were reviewed directly with the patient who was given a copy with highlighter outlining the key components.         .03/28/2014 day of FOB no change hx or exam   Christinia Gully, MD Pulmonary and [place] [phone] After 5:30 PM or weekends, call [phone]

## 2014-03-28 NOTE — Discharge Instructions (Signed)
Flexible Bronchoscopy, Care After These instructions give you information on caring for yourself after your procedure. Your doctor may also give you more specific instructions. Call your doctor if you have any problems or questions after your procedure. HOME CARE  Do not eat or drink anything for 2 hours after your procedure. If you try to eat or drink before the medicine wears off, food or drink could go into your lungs. You could also burn yourself.  After 2 hours have passed and when you can cough and gag normally, you may eat soft food and drink liquids slowly.  The day after the test, you may eat your normal diet.  You may do your normal activities.  Keep all doctor visits. GET HELP RIGHT AWAY IF:  You get more and more short of breath.  You get light-headed.  You feel like you are going to pass out (faint).  You have chest pain.  You have new problems that worry you.  You cough up more than a little blood.  You cough up more blood than before. MAKE SURE YOU:  Understand these instructions.  Will watch your condition.  Will get help right away if you are not doing well or get worse. Document Released: 02/07/2009 Document Revised: 04/17/2013 Document Reviewed: 12/15/2012 [facility] Patient Information 2015 [place], [place]. This information is not intended to replace advice given to you by your health care provider. Make sure you discuss any questions you have with your health care provider.   Do not eat or drink until after 1020 today 03/28/2014

## 2014-03-29 ENCOUNTER — Encounter (HOSPITAL_COMMUNITY): Payer: Self-pay | Admitting: Internal Medicine

## 2014-04-28 LAB — FUNGUS CULTURE W SMEAR
Fungal Smear: NONE SEEN
SPECIAL REQUESTS: NORMAL

## 2014-05-10 LAB — AFB CULTURE WITH SMEAR (NOT AT ARMC)
ACID FAST SMEAR: NONE SEEN
Special Requests: NORMAL

## 2014-05-15 ENCOUNTER — Ambulatory Visit (INDEPENDENT_AMBULATORY_CARE_PROVIDER_SITE_OTHER): Payer: BC Managed Care – PPO

## 2014-05-15 DIAGNOSIS — Z23 Encounter for immunization: Secondary | ICD-10-CM

## 2014-06-14 ENCOUNTER — Other Ambulatory Visit: Payer: Self-pay | Admitting: Internal Medicine

## 2014-06-21 ENCOUNTER — Encounter: Payer: Self-pay | Admitting: Internal Medicine

## 2014-06-21 ENCOUNTER — Ambulatory Visit (INDEPENDENT_AMBULATORY_CARE_PROVIDER_SITE_OTHER): Payer: BC Managed Care – PPO | Admitting: Internal Medicine

## 2014-06-21 VITALS — BP 122/72 | HR 95 | Temp 98.9°F | Resp 16 | Ht 63.0 in | Wt 191.2 lb

## 2014-06-21 DIAGNOSIS — M545 Low back pain, unspecified: Secondary | ICD-10-CM

## 2014-06-21 DIAGNOSIS — R9389 Abnormal findings on diagnostic imaging of other specified body structures: Secondary | ICD-10-CM | POA: Insufficient documentation

## 2014-06-21 MED ORDER — CYCLOBENZAPRINE HCL 5 MG PO TABS: ORAL_TABLET | ORAL | Status: DC

## 2014-06-21 MED ORDER — TRAMADOL HCL 50 MG PO TABS: 50.0000 mg | ORAL_TABLET | Freq: Three times a day (TID) | ORAL | Status: DC | PRN

## 2014-06-21 NOTE — Progress Notes (Signed)
   Subjective:    Patient ID: Deborah Neal, female    DOB: 07/14/1950, 64 y.o.   MRN: [id number]  HPI  She experienced a "pop" in the mid to lower back 06/18/14 after lifting heavy trash bags. The next morning she had sharp, pinching pain in this area particularly with left lateral thoracic rotation. The symptoms were as severe as 8 on a 10 scale. With heat the pain level decreased to a level two. Typically it is an issue when she starts to get out of bed but improves with ambulation.  Although she has a history of CT chest abnormalities she has no warning symptoms. The chest imaging abnormalities were evaluated in detail by Dr. Melvyn Novas ,Pulmonologist.  Review of Systems Fever, chills, sweats, or unexplained weight loss not present. Vertigo, near syncope or imbalance denied. There is no numbness, tingling, or weakness in extremities.   No loss of control of bladder or bowels. Radicular type pain absent.      Objective:   Physical Exam  Pertinent or positive findings include: She has an upper dental plate; she's not wearing her lower plate.  An S4 was noted.  Abdomen is protuberant.  She has hyperpigmented irregular sclerotic scarring over both shins.  Straight leg raising is negative bilaterally to almost 90. She is able to lie flat and sit up without help. Gait to include heel and toe walking were normal.  General appearance :adequately nourished; in no distress. Eyes: No conjunctival inflammation or scleral icterus is present. Oral exam: Lips and gums are healthy appearing.There is no oropharyngeal erythema or exudate noted.  Heart:  Normal rate and regular rhythm. S1 and S2 normal without gallop, murmur, click, or rub. Lungs:Chest clear to auscultation; no wheezes, rhonchi,rales ,or rubs present.No increased work of breathing.  Abdomen: bowel sounds normal, soft and non-tender without masses, organomegaly or hernias noted.  No AAA; no guarding or rebound. No flank tenderness to  percussion. Vascular : all pulses equal ; no bruits present. Skin:Warm & dry.  Intact without suspicious lesions or rashes ; no tenting Lymphatic: No lymphadenopathy is noted about the head, neck, axilla Neuro: Strength, tone & DTRs normal.       Assessment & Plan:  #1 acute low back pain w/o neuromuscular deficit See orders

## 2014-06-21 NOTE — Progress Notes (Signed)
Pre visit review using our clinic review tool, if applicable. No additional management support is needed unless otherwise documented below in the visit note. 

## 2014-06-21 NOTE — Patient Instructions (Addendum)
To Whom It May Concern: Deborah Neal was seen my office today 06/21/14 for acute low back pain related to lifting 06/18/14. She was unable to work 2/24-2/26.  Mix Eucerin 1 part to Cort Aid 1 part and apply  Twice a day to itchy area of skin as needed .

## 2014-09-11 ENCOUNTER — Ambulatory Visit: Payer: BC Managed Care – PPO | Admitting: Internal Medicine

## 2014-09-13 ENCOUNTER — Ambulatory Visit (INDEPENDENT_AMBULATORY_CARE_PROVIDER_SITE_OTHER): Payer: BC Managed Care – PPO | Admitting: Internal Medicine

## 2014-09-13 ENCOUNTER — Encounter: Payer: Self-pay | Admitting: Internal Medicine

## 2014-09-13 ENCOUNTER — Other Ambulatory Visit (INDEPENDENT_AMBULATORY_CARE_PROVIDER_SITE_OTHER): Payer: BC Managed Care – PPO

## 2014-09-13 VITALS — BP 128/82 | HR 79 | Temp 98.9°F | Wt 188.6 lb

## 2014-09-13 DIAGNOSIS — Z0000 Encounter for general adult medical examination without abnormal findings: Secondary | ICD-10-CM

## 2014-09-13 DIAGNOSIS — E119 Type 2 diabetes mellitus without complications: Secondary | ICD-10-CM | POA: Diagnosis not present

## 2014-09-13 DIAGNOSIS — R062 Wheezing: Secondary | ICD-10-CM | POA: Insufficient documentation

## 2014-09-13 LAB — MICROALBUMIN / CREATININE URINE RATIO
Creatinine,U: 149.2 mg/dL
MICROALB UR: 0.7 mg/dL (ref 0.0–1.9)
MICROALB/CREAT RATIO: 0.5 mg/g (ref 0.0–30.0)

## 2014-09-13 LAB — URINALYSIS, ROUTINE W REFLEX MICROSCOPIC
Bilirubin Urine: NEGATIVE
Hgb urine dipstick: NEGATIVE
Ketones, ur: NEGATIVE
Leukocytes, UA: NEGATIVE
NITRITE: NEGATIVE
RBC / HPF: NONE SEEN (ref 0–?)
SPECIFIC GRAVITY, URINE: 1.02 (ref 1.000–1.030)
TOTAL PROTEIN, URINE-UPE24: NEGATIVE
UROBILINOGEN UA: 0.2 (ref 0.0–1.0)
Urine Glucose: NEGATIVE
WBC UA: NONE SEEN (ref 0–?)
pH: 6 (ref 5.0–8.0)

## 2014-09-13 LAB — LIPID PANEL
Cholesterol: 175 mg/dL (ref 0–200)
HDL: 49.9 mg/dL (ref >39.00)
LDL Cholesterol: 94 mg/dL (ref 0–99)
NonHDL: 125.1
Total CHOL/HDL Ratio: 4
Triglycerides: 157 mg/dL — ABNORMAL HIGH (ref 0.0–149.0)
VLDL: 31.4 mg/dL (ref 0.0–40.0)

## 2014-09-13 LAB — TSH: TSH: 3.83 u[IU]/mL (ref 0.35–4.50)

## 2014-09-13 LAB — CBC WITH DIFFERENTIAL/PLATELET
BASOS ABS: 0 10*3/uL (ref 0.0–0.1)
Basophils Relative: 0.4 % (ref 0.0–3.0)
EOS ABS: 0.1 10*3/uL (ref 0.0–0.7)
EOS PCT: 2.3 % (ref 0.0–5.0)
HEMATOCRIT: 38.3 % (ref 36.0–46.0)
Hemoglobin: 12.4 g/dL (ref 12.0–15.0)
LYMPHS ABS: 2.2 10*3/uL (ref 0.7–4.0)
Lymphocytes Relative: 38.4 % (ref 12.0–46.0)
MCHC: 32.3 g/dL (ref 30.0–36.0)
MCV: 74.2 fl — ABNORMAL LOW (ref 78.0–100.0)
MONOS PCT: 8.3 % (ref 3.0–12.0)
Monocytes Absolute: 0.5 10*3/uL (ref 0.1–1.0)
Neutro Abs: 2.8 10*3/uL (ref 1.4–7.7)
Neutrophils Relative %: 50.6 % (ref 43.0–77.0)
Platelets: 253 10*3/uL (ref 150.0–400.0)
RBC: 5.16 Mil/uL — ABNORMAL HIGH (ref 3.87–5.11)
RDW: 15.8 % — AB (ref 11.5–15.5)
WBC: 5.6 10*3/uL (ref 4.0–10.5)

## 2014-09-13 LAB — HEMOGLOBIN A1C: HEMOGLOBIN A1C: 7.1 % — AB (ref 4.6–6.5)

## 2014-09-13 LAB — HEPATIC FUNCTION PANEL
ALT: 29 U/L (ref 0–35)
AST: 24 U/L (ref 0–37)
Albumin: 4.4 g/dL (ref 3.5–5.2)
Alkaline Phosphatase: 77 U/L (ref 39–117)
BILIRUBIN TOTAL: 0.3 mg/dL (ref 0.2–1.2)
Bilirubin, Direct: 0.1 mg/dL (ref 0.0–0.3)
Total Protein: 7.7 g/dL (ref 6.0–8.3)

## 2014-09-13 LAB — BASIC METABOLIC PANEL
BUN: 12 mg/dL (ref 6–23)
CALCIUM: 10.1 mg/dL (ref 8.4–10.5)
CO2: 32 mEq/L (ref 19–32)
CREATININE: 0.86 mg/dL (ref 0.40–1.20)
Chloride: 102 mEq/L (ref 96–112)
GFR: 85.65 mL/min (ref >60.00)
Glucose, Bld: 106 mg/dL — ABNORMAL HIGH (ref 70–99)
Potassium: 3.4 mEq/L — ABNORMAL LOW (ref 3.5–5.1)
SODIUM: 140 meq/L (ref 135–145)

## 2014-09-13 MED ORDER — HYDROCODONE-HOMATROPINE 5-1.5 MG/5ML PO SYRP: 5.0000 mL | ORAL_SOLUTION | Freq: Four times a day (QID) | ORAL | Status: DC | PRN

## 2014-09-13 MED ORDER — PREDNISONE 10 MG PO TABS: ORAL_TABLET | ORAL | Status: DC

## 2014-09-13 MED ORDER — METHYLPREDNISOLONE ACETATE 80 MG/ML IJ SUSP: 80.0000 mg | Freq: Once | INTRAMUSCULAR | Status: DC

## 2014-09-13 MED ORDER — LEVOFLOXACIN 250 MG PO TABS: 250.0000 mg | ORAL_TABLET | Freq: Every day | ORAL | Status: DC

## 2014-09-13 NOTE — Assessment & Plan Note (Signed)
Mild to mod, for depomedrol IM, predpac asd, cough med prn,  to f/u any worsening symptoms or concerns 

## 2014-09-13 NOTE — Assessment & Plan Note (Signed)
C/w bronchitis vs pna  - Mild to mod, for antibx course,  to f/u any worsening symptoms or concerns, check cxr

## 2014-09-13 NOTE — Patient Instructions (Addendum)
Please continue all other medications as before, and refills have been done if requested.  Please have the pharmacy call with any other refills you may need.  Please continue your efforts at being more active, low cholesterol diet, and weight control.  You are otherwise up to date with prevention measures today.  Please keep your appointments with your specialists as you may have planned  Please go to the LAB in the Basement (turn left off the elevator) for the tests to be done today  You will be contacted by phone if any changes need to be made immediately.  Otherwise, you will receive a letter about your results with an explanation, but please check with MyChart first.  Please remember to sign up for MyChart if you have not done so, as this will be important to you in the future with finding out test results, communicating by private email, and scheduling acute appointments online when needed.  Please return in 6 months, or sooner if needed, with Lab testing done 3-5 days before  You are given the work note

## 2014-09-13 NOTE — Progress Notes (Signed)
Subjective:    Patient ID: Deborah Neal, female    DOB: 03-27-1951, 64 y.o.   MRN: [id number]  HPI  Here for wellness and f/u;  Overall doing ok;  Pt denies Chest pain, worsening SOB, DOE, wheezing, orthopnea, PND, worsening LE edema, palpitations, dizziness or syncope.  Pt denies neurological change such as new headache, facial or extremity weakness.  Pt denies polydipsia, polyuria, or low sugar symptoms. Pt states overall good compliance with treatment and medications, good tolerability, and has been trying to follow appropriate diet.  Pt denies worsening depressive symptoms, suicidal ideation or panic. No fever, night sweats, wt loss, loss of appetite, or other constitutional symptoms.  Pt states good ability with ADL's, has low fall risk, home safety reviewed and adequate, no other significant changes in hearing or vision, and only occasionally active with exercise. Tolerating meds well. Hard to lose wt plans to be more active Wt Readings from Last 3 Encounters:  09/13/14 188 lb 9.6 oz (85.548 kg)  06/21/14 191 lb 4 oz (86.75 kg)  03/25/14 190 lb (86.183 kg)    Past Medical History  Diagnosis Date  . ALLERGIC RHINITIS 01/29/2007  . ASTHMA, WITH ACUTE EXACERBATION 06/24/2008  . ASTHMA 01/29/2007  . BACK PAIN 01/29/2009  . CHEST PAIN 06/24/2008  . HYPERLIPIDEMIA 01/29/2007  . HYPERTENSION 01/29/2007  . OSTEOPENIA 10/16/2007  . Type II or unspecified type diabetes mellitus without mention of complication, uncontrolled 11/29/2013   Past Surgical History  Procedure Laterality Date  . Video bronchoscopy Bilateral 03/28/2014    Procedure: VIDEO BRONCHOSCOPY WITHOUT FLUORO;  Surgeon: Tanda Rockers, MD;  Location: [facility];  Service: Cardiopulmonary;  Laterality: Bilateral;    reports that she quit smoking about 11 years ago. She has never used smokeless tobacco. She reports that she does not drink alcohol or use illicit drugs. family history includes Asthma in her daughter and father; Cancer in  her mother; Diabetes in her father; Heart disease in her father; Hypertension in her father. No Known Allergies Current Outpatient Prescriptions on File Prior to Visit  Medication Sig Dispense Refill  . aspirin 81 MG tablet Take 81 mg by mouth every morning.     . benzonatate (TESSALON PERLES) 100 MG capsule Take 1 capsule (100 mg total) by mouth 3 (three) times daily as needed for cough. 90 capsule 0  . cyclobenzaprine (FLEXERIL) 5 MG tablet 1-2 qhs prn 14 tablet 0  . ferrous sulfate 325 (65 FE) MG tablet Take 325 mg by mouth daily with breakfast.    . hydrochlorothiazide (HYDRODIURIL) 25 MG tablet TAKE ONE TABLET BY MOUTH ONCE DAILY 90 tablet 3  . lovastatin (MEVACOR) 20 MG tablet TAKE TWO TABLETS BY MOUTH AT BEDTIME 180 tablet 3  . metFORMIN (GLUCOPHAGE XR) 500 MG 24 hr tablet Take 1 tablet (500 mg total) by mouth daily with breakfast. 90 tablet 3  . Multiple Vitamin (MULTIVITAMIN WITH MINERALS) TABS tablet Take 1 tablet by mouth daily.    . potassium chloride (K-DUR) 10 MEQ tablet TAKE TWO TABLETS BY MOUTH ONCE DAILY 180 tablet 3  . traMADol (ULTRAM) 50 MG tablet Take 1 tablet (50 mg total) by mouth every 8 (eight) hours as needed. 30 tablet 0  . VENTOLIN HFA 108 (90 BASE) MCG/ACT inhaler INHALE TWO PUFFS EVERY 6 HOURS AS NEEDED FOR WHEEZING 18 each 0  . albuterol (PROVENTIL HFA;VENTOLIN HFA) 108 (90 BASE) MCG/ACT inhaler Inhale 2 puffs into the lungs every 6 (six) hours as needed. For wheezing  No current facility-administered medications on file prior to visit.    Review of Systems  Constitutional: Negative for increased diaphoresis, other activity, appetite or siginficant weight change other than noted HENT: Negative for worsening hearing loss, ear pain, facial swelling, mouth sores and neck stiffness.   Eyes: Negative for other worsening pain, redness or visual disturbance.  Respiratory: Negative for shortness of breath and wheezing  Cardiovascular: Negative for chest pain and  palpitations.  Gastrointestinal: Negative for diarrhea, blood in stool, abdominal distention or other pain Genitourinary: Negative for hematuria, flank pain or change in urine volume.  Musculoskeletal: Negative for myalgias or other joint complaints.  Skin: Negative for color change and wound or drainage.  Neurological: Negative for syncope and numbness. other than noted Hematological: Negative for adenopathy. or other swelling Psychiatric/Behavioral: Negative for hallucinations, SI, self-injury, decreased concentration or other worsening agitation.        Objective:   Physical Exam BP 128/82 mmHg  Pulse 79  Temp(Src) 98.9 F (37.2 C) (Oral)  Wt 188 lb 9.6 oz (85.548 kg)  SpO2 99% VS noted,  Constitutional: Pt is oriented to person, place, and time. Appears well-developed and well-nourished, in no significant distress Head: Normocephalic and atraumatic.  Right Ear: External ear normal.  Left Ear: External ear normal.  Nose: Nose normal.  Mouth/Throat: Oropharynx is clear and moist.  Eyes: Conjunctivae and EOM are normal. Pupils are equal, round, and reactive to light.  Neck: Normal range of motion. Neck supple. No JVD present. No tracheal deviation present or significant neck LA or mass Cardiovascular: Normal rate, regular rhythm, normal heart sounds and intact distal pulses.   Pulmonary/Chest: Effort normal and breath sounds without rales or wheezing  Abdominal: Soft. Bowel sounds are normal. NT. No HSM  Musculoskeletal: Normal range of motion. Exhibits no edema.  Lymphadenopathy:  Has no cervical adenopathy.  Neurological: Pt is alert and oriented to person, place, and time. Pt has normal reflexes. No cranial nerve deficit. Motor grossly intact Skin: Skin is warm and dry. No rash noted.  Psychiatric:  Has normal mood and affect. Behavior is normal.      Assessment & Plan:

## 2014-09-13 NOTE — Assessment & Plan Note (Signed)
stable overall by history and exam, recent data reviewed with pt, and pt to continue medical treatment as before,  to f/u any worsening symptoms or concerns Lab Results  Component Value Date   HGBA1C 8.1* 11/29/2013   For f/u a1c

## 2014-09-13 NOTE — Assessment & Plan Note (Signed)

## 2014-09-13 NOTE — Progress Notes (Signed)
Pre visit review using our clinic review tool, if applicable. No additional management support is needed unless otherwise documented below in the visit note. 

## 2014-10-20 ENCOUNTER — Other Ambulatory Visit: Payer: Self-pay | Admitting: Internal Medicine

## 2015-01-29 ENCOUNTER — Other Ambulatory Visit: Payer: Self-pay

## 2015-01-29 DIAGNOSIS — Z1231 Encounter for screening mammogram for malignant neoplasm of breast: Secondary | ICD-10-CM

## 2015-02-12 ENCOUNTER — Ambulatory Visit
Admission: RE | Admit: 2015-02-12 | Discharge: 2015-02-12 | Disposition: A | Discharge status: Home / Self Care | Payer: BC Managed Care – PPO | Source: Ambulatory Visit

## 2015-02-12 ENCOUNTER — Ambulatory Visit: Payer: BC Managed Care – PPO

## 2015-02-12 DIAGNOSIS — Z1231 Encounter for screening mammogram for malignant neoplasm of breast: Secondary | ICD-10-CM

## 2015-02-18 ENCOUNTER — Other Ambulatory Visit: Payer: Self-pay | Admitting: Internal Medicine

## 2015-03-05 ENCOUNTER — Encounter: Payer: Self-pay | Admitting: Internal Medicine

## 2015-03-18 ENCOUNTER — Ambulatory Visit (INDEPENDENT_AMBULATORY_CARE_PROVIDER_SITE_OTHER): Payer: BC Managed Care – PPO | Admitting: Internal Medicine

## 2015-03-18 ENCOUNTER — Encounter: Payer: Self-pay | Admitting: Internal Medicine

## 2015-03-18 ENCOUNTER — Other Ambulatory Visit (INDEPENDENT_AMBULATORY_CARE_PROVIDER_SITE_OTHER): Payer: BC Managed Care – PPO

## 2015-03-18 ENCOUNTER — Other Ambulatory Visit: Payer: Self-pay | Admitting: Internal Medicine

## 2015-03-18 VITALS — BP 148/82 | HR 88 | Temp 99.8°F | Ht 63.0 in | Wt 187.0 lb

## 2015-03-18 DIAGNOSIS — E119 Type 2 diabetes mellitus without complications: Secondary | ICD-10-CM | POA: Diagnosis not present

## 2015-03-18 DIAGNOSIS — J4520 Mild intermittent asthma, uncomplicated: Secondary | ICD-10-CM

## 2015-03-18 DIAGNOSIS — Z0000 Encounter for general adult medical examination without abnormal findings: Secondary | ICD-10-CM

## 2015-03-18 DIAGNOSIS — I10 Essential (primary) hypertension: Secondary | ICD-10-CM

## 2015-03-18 DIAGNOSIS — E785 Hyperlipidemia, unspecified: Secondary | ICD-10-CM | POA: Diagnosis not present

## 2015-03-18 DIAGNOSIS — Z0189 Encounter for other specified special examinations: Secondary | ICD-10-CM

## 2015-03-18 LAB — BASIC METABOLIC PANEL
BUN: 15 mg/dL (ref 6–23)
CHLORIDE: 101 meq/L (ref 96–112)
CO2: 33 mEq/L — ABNORMAL HIGH (ref 19–32)
CREATININE: 1 mg/dL (ref 0.40–1.20)
Calcium: 10 mg/dL (ref 8.4–10.5)
GFR: 71.85 mL/min (ref >60.00)
Glucose, Bld: 102 mg/dL — ABNORMAL HIGH (ref 70–99)
POTASSIUM: 3.5 meq/L (ref 3.5–5.1)
SODIUM: 141 meq/L (ref 135–145)

## 2015-03-18 LAB — HEPATIC FUNCTION PANEL
ALK PHOS: 86 U/L (ref 39–117)
ALT: 30 U/L (ref 0–35)
AST: 27 U/L (ref 0–37)
Albumin: 4.3 g/dL (ref 3.5–5.2)
BILIRUBIN DIRECT: 0.1 mg/dL (ref 0.0–0.3)
BILIRUBIN TOTAL: 0.3 mg/dL (ref 0.2–1.2)
Total Protein: 7.7 g/dL (ref 6.0–8.3)

## 2015-03-18 LAB — LIPID PANEL
CHOL/HDL RATIO: 3
Cholesterol: 156 mg/dL (ref 0–200)
HDL: 45.5 mg/dL (ref >39.00)
LDL Cholesterol: 87 mg/dL (ref 0–99)
NONHDL: 110.93
Triglycerides: 120 mg/dL (ref 0.0–149.0)
VLDL: 24 mg/dL (ref 0.0–40.0)

## 2015-03-18 LAB — HEMOGLOBIN A1C: Hgb A1c MFr Bld: 7.5 % — ABNORMAL HIGH (ref 4.6–6.5)

## 2015-03-18 MED ORDER — ROSUVASTATIN CALCIUM 20 MG PO TABS: 20.0000 mg | ORAL_TABLET | Freq: Every day | ORAL | Status: DC

## 2015-03-18 MED ORDER — METFORMIN HCL ER 500 MG PO TB24: 1000.0000 mg | ORAL_TABLET | Freq: Every day | ORAL | Status: DC

## 2015-03-18 NOTE — Progress Notes (Signed)
Pre visit review using our clinic review tool, if applicable. No additional management support is needed unless otherwise documented below in the visit note. 

## 2015-03-18 NOTE — Patient Instructions (Addendum)
OK to stop the lovastatin  Please take all new medication as prescribed - the generic crestor  Please continue all other medications as before, and refills have been done if requested.  Please have the pharmacy call with any other refills you may need.  Please continue your efforts at being more active, low cholesterol diet, and weight control.  Please keep your appointments with your specialists as you may have planned  Please go to the LAB in the Basement (turn left off the elevator) for the tests to be done today  You will be contacted by phone if any changes need to be made immediately.  Otherwise, you will receive a letter about your results with an explanation, but please check with MyChart first.  Please remember to sign up for MyChart if you have not done so, as this will be important to you in the future with finding out test results, communicating by private email, and scheduling acute appointments online when needed.  Please return in 6 months, or sooner if needed, with Lab testing done 3-5 days before

## 2015-03-18 NOTE — Assessment & Plan Note (Signed)
stable overall by history and exam, recent data reviewed with pt, and pt to continue medical treatment as before,  to f/u any worsening symptoms or concerns Lab Results  Component Value Date   HGBA1C 7.1* 09/13/2014

## 2015-03-18 NOTE — Progress Notes (Signed)
Subjective:    Patient ID: Deborah Neal, female    DOB: 04/28/1950, 64 y.o.   MRN: [id number]  HPI  Here to f/u; overall doing ok,  Pt denies chest pain, increasing sob or doe, wheezing, orthopnea, PND, increased LE swelling, palpitations, dizziness or syncope.  Pt denies new neurological symptoms such as new headache, or facial or extremity weakness or numbness.  Pt denies polydipsia, polyuria, or low sugar episode.   Pt denies new neurological symptoms such as new headache, or facial or extremity weakness or numbness.   Pt states overall good compliance with meds, mostly trying to follow appropriate diet, with wt overall stable,  but little exercise however. Past Medical History  Diagnosis Date  . ALLERGIC RHINITIS 01/29/2007  . ASTHMA, WITH ACUTE EXACERBATION 06/24/2008  . ASTHMA 01/29/2007  . BACK PAIN 01/29/2009  . CHEST PAIN 06/24/2008  . HYPERLIPIDEMIA 01/29/2007  . HYPERTENSION 01/29/2007  . OSTEOPENIA 10/16/2007  . Type II or unspecified type diabetes mellitus without mention of complication, uncontrolled 11/29/2013   Past Surgical History  Procedure Laterality Date  . Video bronchoscopy Bilateral 03/28/2014    Procedure: VIDEO BRONCHOSCOPY WITHOUT FLUORO;  Surgeon: Tanda Rockers, MD;  Location: [facility];  Service: Cardiopulmonary;  Laterality: Bilateral;    reports that she quit smoking about 11 years ago. She has never used smokeless tobacco. She reports that she does not drink alcohol or use illicit drugs. family history includes Asthma in her daughter and father; Cancer in her mother; Diabetes in her father; Heart disease in her father; Hypertension in her father. No Known Allergies Current Outpatient Prescriptions on File Prior to Visit  Medication Sig Dispense Refill  . alendronate (FOSAMAX) 70 MG tablet TAKE ONE TABLET BY MOUTH ONCE EVERY 7 DAYS. TAKE  WITH  A  FULL  GLASS  OF  WATER  AND  ON  AN  EMPTY  STOMACH 12 tablet 1  . amLODipine-benazepril (LOTREL) 5-20 MG capsule TAKE  TWO CAPSULES BY MOUTH ONCE DAILY. 180 capsule 1  . amLODipine-benazepril (LOTREL) 5-20 MG per capsule TAKE TWO CAPSULES BY MOUTH ONCE DAILY. 180 capsule 0  . aspirin 81 MG tablet Take 81 mg by mouth every morning.     . cyclobenzaprine (FLEXERIL) 5 MG tablet 1-2 qhs prn 14 tablet 0  . ferrous sulfate 325 (65 FE) MG tablet Take 325 mg by mouth daily with breakfast.    . hydrochlorothiazide (HYDRODIURIL) 25 MG tablet TAKE ONE TABLET BY MOUTH ONCE DAILY 90 tablet 1  . metFORMIN (GLUCOPHAGE-XR) 500 MG 24 hr tablet TAKE ONE TABLET BY MOUTH ONCE DAILY WITH BREAKFAST 90 tablet 1  . Multiple Vitamin (MULTIVITAMIN WITH MINERALS) TABS tablet Take 1 tablet by mouth daily.    . potassium chloride (K-DUR) 10 MEQ tablet TAKE TWO TABLETS BY MOUTH ONCE DAILY 180 tablet 0  . potassium chloride (K-DUR) 10 MEQ tablet TAKE TWO TABLETS BY MOUTH ONCE DAILY 180 tablet 1  . traMADol (ULTRAM) 50 MG tablet Take 1 tablet (50 mg total) by mouth every 8 (eight) hours as needed. 30 tablet 0  . VENTOLIN HFA 108 (90 BASE) MCG/ACT inhaler INHALE TWO PUFFS EVERY 6 HOURS AS NEEDED FOR WHEEZING 18 each 0  . albuterol (PROVENTIL HFA;VENTOLIN HFA) 108 (90 BASE) MCG/ACT inhaler Inhale 2 puffs into the lungs every 6 (six) hours as needed. For wheezing    . benzonatate (TESSALON PERLES) 100 MG capsule Take 1 capsule (100 mg total) by mouth 3 (three) times daily as needed  for cough. (Patient not taking: Reported on 03/18/2015) 90 capsule 0  . HYDROcodone-homatropine (HYCODAN) 5-1.5 MG/5ML syrup Take 5 mLs by mouth every 6 (six) hours as needed for cough. (Patient not taking: Reported on 03/18/2015) 180 mL 0   No current facility-administered medications on file prior to visit.   Review of Systems  Constitutional: Negative for unusual diaphoresis or night sweats HENT: Negative for ringing in ear or discharge Eyes: Negative for double vision or worsening visual disturbance.  Respiratory: Negative for choking and stridor.     Gastrointestinal: Negative for vomiting or other signifcant bowel change Genitourinary: Negative for hematuria or change in urine volume.  Musculoskeletal: Negative for other MSK pain or swelling Skin: Negative for color change and worsening wound.  Neurological: Negative for tremors and numbness other than noted  Psychiatric/Behavioral: Negative for decreased concentration or agitation other than above       Objective:   Physical Exam BP 148/82 mmHg  Pulse 88  Temp(Src) 99.8 F (37.7 C) (Oral)  Ht 5\' 3"  (1.6 m)  Wt 187 lb (84.823 kg)  BMI 33.13 kg/m2  SpO2 94% VS noted, not ill appearing Constitutional: Pt appears in no significant distress HENT: Head: NCAT.  Right Ear: External ear normal.  Left Ear: External ear normal.  Eyes: . Pupils are equal, round, and reactive to light. Conjunctivae and EOM are normal Neck: Normal range of motion. Neck supple.  Cardiovascular: Normal rate and regular rhythm.   Pulmonary/Chest: Effort normal and breath sounds without rales or wheezing.  Abd:  Soft, NT, ND, + BS Neurological: Pt is alert. Not confused , motor grossly intact Skin: Skin is warm. No rash, no LE edema Psychiatric: Pt behavior is normal. No agitation.     Assessment & Plan:

## 2015-03-18 NOTE — Assessment & Plan Note (Signed)
Mild elev today, pt asympt, declines furhter med change, o/w stable overall by history and exam, recent data reviewed with pt, and pt to continue medical treatment as before,  to f/u any worsening symptoms or concerns BP Readings from Last 3 Encounters:  03/18/15 148/82  09/13/14 128/82  06/21/14 122/72

## 2015-03-18 NOTE — Assessment & Plan Note (Signed)
stable overall by history and exam, recent data reviewed with pt, and pt to continue medical treatment as before,  to f/u any worsening symptoms or concerns SpO2 Readings from Last 3 Encounters:  03/18/15 94%  09/13/14 99%  06/21/14 97%

## 2015-03-18 NOTE — Assessment & Plan Note (Signed)
Lab Results  Component Value Date   LDLCALC 94 09/13/2014   Goal ldl < 70 - ok to change to crestor 20 qd,  to f/u any worsening symptoms or concerns

## 2015-07-14 ENCOUNTER — Ambulatory Visit (INDEPENDENT_AMBULATORY_CARE_PROVIDER_SITE_OTHER): Payer: BC Managed Care – PPO

## 2015-07-14 DIAGNOSIS — Z23 Encounter for immunization: Secondary | ICD-10-CM | POA: Diagnosis not present

## 2015-08-04 ENCOUNTER — Emergency Department (HOSPITAL_COMMUNITY): Payer: BC Managed Care – PPO

## 2015-08-04 ENCOUNTER — Encounter (HOSPITAL_COMMUNITY): Payer: Self-pay | Admitting: Emergency Medicine

## 2015-08-04 ENCOUNTER — Emergency Department (HOSPITAL_COMMUNITY)
Admission: EM | Admit: 2015-08-04 | Discharge: 2015-08-04 | Disposition: A | Discharge status: Home / Self Care | Payer: BC Managed Care – PPO | Attending: Emergency Medicine | Admitting: Emergency Medicine

## 2015-08-04 DIAGNOSIS — K8020 Calculus of gallbladder without cholecystitis without obstruction: Secondary | ICD-10-CM | POA: Insufficient documentation

## 2015-08-04 DIAGNOSIS — J45909 Unspecified asthma, uncomplicated: Secondary | ICD-10-CM | POA: Insufficient documentation

## 2015-08-04 DIAGNOSIS — E119 Type 2 diabetes mellitus without complications: Secondary | ICD-10-CM | POA: Insufficient documentation

## 2015-08-04 DIAGNOSIS — Z79899 Other long term (current) drug therapy: Secondary | ICD-10-CM | POA: Insufficient documentation

## 2015-08-04 DIAGNOSIS — Z7982 Long term (current) use of aspirin: Secondary | ICD-10-CM | POA: Insufficient documentation

## 2015-08-04 DIAGNOSIS — R109 Unspecified abdominal pain: Secondary | ICD-10-CM

## 2015-08-04 DIAGNOSIS — I10 Essential (primary) hypertension: Secondary | ICD-10-CM | POA: Insufficient documentation

## 2015-08-04 DIAGNOSIS — E785 Hyperlipidemia, unspecified: Secondary | ICD-10-CM | POA: Diagnosis not present

## 2015-08-04 DIAGNOSIS — Z8739 Personal history of other diseases of the musculoskeletal system and connective tissue: Secondary | ICD-10-CM | POA: Diagnosis not present

## 2015-08-04 DIAGNOSIS — Z7984 Long term (current) use of oral hypoglycemic drugs: Secondary | ICD-10-CM | POA: Diagnosis not present

## 2015-08-04 DIAGNOSIS — Z87891 Personal history of nicotine dependence: Secondary | ICD-10-CM | POA: Insufficient documentation

## 2015-08-04 DIAGNOSIS — R1084 Generalized abdominal pain: Secondary | ICD-10-CM | POA: Diagnosis present

## 2015-08-04 LAB — COMPREHENSIVE METABOLIC PANEL
ALT: 31 U/L (ref 14–54)
ANION GAP: 8 (ref 5–15)
AST: 30 U/L (ref 15–41)
Albumin: 4.1 g/dL (ref 3.5–5.0)
Alkaline Phosphatase: 70 U/L (ref 38–126)
BUN: 22 mg/dL — ABNORMAL HIGH (ref 6–20)
CALCIUM: 9 mg/dL (ref 8.9–10.3)
CHLORIDE: 108 mmol/L (ref 101–111)
CO2: 24 mmol/L (ref 22–32)
Creatinine, Ser: 1.43 mg/dL — ABNORMAL HIGH (ref 0.44–1.00)
GFR, EST AFRICAN AMERICAN: 44 mL/min — AB (ref >60)
GFR, EST NON AFRICAN AMERICAN: 38 mL/min — AB (ref >60)
Glucose, Bld: 111 mg/dL — ABNORMAL HIGH (ref 65–99)
Potassium: 4 mmol/L (ref 3.5–5.1)
SODIUM: 140 mmol/L (ref 135–145)
Total Bilirubin: 0.5 mg/dL (ref 0.3–1.2)
Total Protein: 7.6 g/dL (ref 6.5–8.1)

## 2015-08-04 LAB — URINE MICROSCOPIC-ADD ON: RBC / HPF: NONE SEEN RBC/hpf (ref 0–5)

## 2015-08-04 LAB — URINALYSIS, ROUTINE W REFLEX MICROSCOPIC
Glucose, UA: NEGATIVE mg/dL
Hgb urine dipstick: NEGATIVE
KETONES UR: NEGATIVE mg/dL
NITRITE: NEGATIVE
PH: 5.5 (ref 5.0–8.0)
Protein, ur: NEGATIVE mg/dL
SPECIFIC GRAVITY, URINE: 1.036 — AB (ref 1.005–1.030)

## 2015-08-04 LAB — CBC
HCT: 39.5 % (ref 36.0–46.0)
HEMOGLOBIN: 12.6 g/dL (ref 12.0–15.0)
MCH: 23.4 pg — AB (ref 26.0–34.0)
MCHC: 31.9 g/dL (ref 30.0–36.0)
MCV: 73.4 fL — ABNORMAL LOW (ref 78.0–100.0)
PLATELETS: 231 10*3/uL (ref 150–400)
RBC: 5.38 MIL/uL — AB (ref 3.87–5.11)
RDW: 14.9 % (ref 11.5–15.5)
WBC: 5 10*3/uL (ref 4.0–10.5)

## 2015-08-04 LAB — LIPASE, BLOOD: LIPASE: 29 U/L (ref 11–51)

## 2015-08-04 MED ORDER — SODIUM CHLORIDE 0.9 % IV BOLUS (SEPSIS)
1000.0000 mL | Freq: Once | INTRAVENOUS | Status: AC
  Administered 2015-08-04: 1000 mL via INTRAVENOUS

## 2015-08-04 MED ORDER — FENTANYL CITRATE (PF) 100 MCG/2ML IJ SOLN
12.5000 ug | Freq: Once | INTRAMUSCULAR | Status: AC
  Administered 2015-08-04: 12.5 ug via INTRAVENOUS
  Filled 2015-08-04: qty 2

## 2015-08-04 MED ORDER — IOPAMIDOL (ISOVUE-300) INJECTION 61%
80.0000 mL | Freq: Once | INTRAVENOUS | Status: AC | PRN
  Administered 2015-08-04: 80 mL via INTRAVENOUS

## 2015-08-04 MED ORDER — ONDANSETRON 4 MG PO TBDP: 4.0000 mg | ORAL_TABLET | Freq: Three times a day (TID) | ORAL | Status: DC | PRN

## 2015-08-04 MED ORDER — TRAMADOL HCL 50 MG PO TABS: 50.0000 mg | ORAL_TABLET | Freq: Four times a day (QID) | ORAL | Status: DC | PRN

## 2015-08-04 NOTE — ED Notes (Signed)
Pt from home with back pain and generalized abdominal pain since yesterday. Pt states she has had diarrhea 2 times today but denies any emesis. Pt does not recall any injury to her back, but it is in the middle of her back and she describes it as a cramping pain. Pt is able to ambulate without assistance. Pt also has complaints of tingling in her fingers in her right hand. Pt has a history of DM. Pt has a non remarkable neuro exam

## 2015-08-04 NOTE — ED Notes (Signed)
Pt ambulatory and independent at discharge.  Left with daughter.

## 2015-08-04 NOTE — Discharge Instructions (Signed)
1. Medications: tramadol for pain, zofran for nausea, usual home medications 2. Treatment: rest, drink plenty of fluids 3. Follow Up: please followup with your primary doctor and with general surgery for discussion of your diagnoses and further evaluation after today's visit; please return to the ER high fever, severe pain, vomiting, loss of control of your bowel or bladder   Biliary Colic Biliary colic is a pain in the upper abdomen. The pain:  Is usually felt on the right side of the abdomen, but it may also be felt in the center of the abdomen, just below the breastbone (sternum).  May spread back toward the right shoulder blade.  May be steady or irregular.  May be accompanied by nausea and vomiting. Most of the time, the pain goes away in 1-5 hours. After the most intense pain passes, the abdomen may continue to ache mildly for about 24 hours. Biliary colic is caused by a blockage in the bile duct. The bile duct is a pathway that carries bile--a liquid that helps to digest fats--from the gallbladder to the small intestine. Biliary colic usually occurs after eating, when the digestive system demands bile. The pain develops when muscle cells contract forcefully to try to move the blockage so that bile can get by. HOME CARE INSTRUCTIONS  Take medicines only as directed by your health care provider.  Drink enough fluid to keep your urine clear or pale yellow.  Avoid fatty, greasy, and fried foods. These kinds of foods increase your body's demand for bile.  Avoid any foods that make your pain worse.  Avoid overeating.  Avoid having a large meal after fasting. SEEK MEDICAL CARE IF:  You develop a fever.  Your pain gets worse.  You vomit.  You develop nausea that prevents you from eating and drinking. SEEK IMMEDIATE MEDICAL CARE IF:  You suddenly develop a fever and shaking chills.  You develop a yellowish discoloration (jaundice) of:  Skin.  Whites of the eyes.  Mucous  membranes.  You have continuous or severe pain that is not relieved with medicines.  You have nausea and vomiting that is not relieved with medicines.  You develop dizziness or you faint.   This information is not intended to replace advice given to you by your health care provider. Make sure you discuss any questions you have with your health care provider.   Document Released: 09/13/2005 Document Revised: 08/27/2014 Document Reviewed: 01/22/2014 Elsevier Interactive Patient Education [employer].

## 2015-08-04 NOTE — ED Notes (Signed)
Called without response from lobby  

## 2015-08-04 NOTE — ED Provider Notes (Signed)
CSN: [id number]     Arrival date & time 08/04/15  1831 History   First MD Initiated Contact with Patient 08/04/15 2123     Chief Complaint  Patient presents with  . Back Pain  . Abdominal Pain    HPI   Deborah Neal is a 65 y.o. female with a PMH of asthma, HLD, HTN, DM who presents to the ED with generalized abdominal pain and low back pain. She states her symptoms started yesterday and been constant since that time. She denies exacerbating factors. She has not tried anything for symptom relief. She denies fever or chills. She reports nausea but denies vomiting. She reports 2 episodes of diarrhea today. She denies hematochezia or melena. She denies dysuria, urgency, frequency, bowel or bladder incontinence, saddle anesthesia, history of malignancy, IV drug use, anticoagulant use, numbness, weakness. She reports tingling in her hands bilaterally, which she attributes to diabetes and notes is unchanged from baseline.   Past Medical History  Diagnosis Date  . ALLERGIC RHINITIS 01/29/2007  . ASTHMA, WITH ACUTE EXACERBATION 06/24/2008  . ASTHMA 01/29/2007  . BACK PAIN 01/29/2009  . CHEST PAIN 06/24/2008  . HYPERLIPIDEMIA 01/29/2007  . HYPERTENSION 01/29/2007  . OSTEOPENIA 10/16/2007  . Type II or unspecified type diabetes mellitus without mention of complication, uncontrolled 11/29/2013   Past Surgical History  Procedure Laterality Date  . Video bronchoscopy Bilateral 03/28/2014    Procedure: VIDEO BRONCHOSCOPY WITHOUT FLUORO;  Surgeon: Tanda Rockers, MD;  Location: [facility];  Service: Cardiopulmonary;  Laterality: Bilateral;   Family History  Problem Relation Age of Onset  . Cancer Mother     ? type   . Diabetes Father   . Hypertension Father   . Heart disease Father   . Asthma Father   . Asthma Daughter    Social History  Substance Use Topics  . Smoking status: Former Smoker    Quit date: 09/07/2003  . Smokeless tobacco: Never Used  . Alcohol Use: No   OB History    Gravida Para  Term Preterm AB TAB SAB Ectopic Multiple Living   5 5 5       5       Review of Systems  Constitutional: Negative for fever and chills.  Respiratory: Negative for shortness of breath.   Cardiovascular: Negative for chest pain.  Gastrointestinal: Positive for nausea, abdominal pain and diarrhea. Negative for vomiting, constipation and blood in stool.  Genitourinary: Negative for dysuria, urgency and frequency.  Musculoskeletal: Positive for back pain.  Neurological: Negative for weakness and numbness.  All other systems reviewed and are negative.     Allergies  Review of patient's allergies indicates no known allergies.  Home Medications   Prior to Admission medications   Medication Sig Start Date End Date Taking? Authorizing Provider  albuterol (PROVENTIL HFA;VENTOLIN HFA) 108 (90 Base) MCG/ACT inhaler Inhale 2 puffs into the lungs every 6 (six) hours as needed for wheezing or shortness of breath.   Yes Historical Provider, MD  amLODipine-benazepril (LOTREL) 5-20 MG capsule TAKE TWO CAPSULES BY MOUTH ONCE DAILY. 02/18/15  Yes Biagio Borg, MD  aspirin 81 MG tablet Take 81 mg by mouth every morning.    Yes Historical Provider, MD  cyclobenzaprine (FLEXERIL) 5 MG tablet 1-2 qhs prn Patient taking differently: Take 5-10 mg by mouth at bedtime as needed for muscle spasms.  06/21/14  Yes Hendricks Limes, MD  ferrous sulfate 325 (65 FE) MG tablet Take 325 mg by mouth daily with  breakfast.   Yes Historical Provider, MD  hydrochlorothiazide (HYDRODIURIL) 25 MG tablet TAKE ONE TABLET BY MOUTH ONCE DAILY 02/18/15  Yes Biagio Borg, MD  metFORMIN (GLUCOPHAGE-XR) 500 MG 24 hr tablet Take 2 tablets (1,000 mg total) by mouth daily with breakfast. 03/18/15  Yes Biagio Borg, MD  Multiple Vitamin (MULTIVITAMIN WITH MINERALS) TABS tablet Take 1 tablet by mouth daily.   Yes Historical Provider, MD  potassium chloride (K-DUR) 10 MEQ tablet TAKE TWO TABLETS BY MOUTH ONCE DAILY 10/21/14  Yes Biagio Borg,  MD  rosuvastatin (CRESTOR) 20 MG tablet Take 1 tablet (20 mg total) by mouth daily. 03/18/15  Yes Biagio Borg, MD  alendronate (FOSAMAX) 70 MG tablet TAKE ONE TABLET BY MOUTH ONCE EVERY 7 DAYS. TAKE  WITH  A  FULL  GLASS  OF  WATER  AND  ON  AN  EMPTY  STOMACH 02/18/15   Biagio Borg, MD  ondansetron (ZOFRAN ODT) 4 MG disintegrating tablet Take 1 tablet (4 mg total) by mouth every 8 (eight) hours as needed for nausea. 08/04/15   Marella Chimes, PA-C  traMADol (ULTRAM) 50 MG tablet Take 1 tablet (50 mg total) by mouth every 6 (six) hours as needed. 08/04/15   Marella Chimes, PA-C    BP 116/71 mmHg  Pulse 79  Temp(Src) 98.8 F (37.1 C) (Oral)  Resp 17  Ht 5\' 3"  (1.6 m)  Wt 84.823 kg  BMI 33.13 kg/m2  SpO2 98% Physical Exam  Constitutional: She is oriented to person, place, and time. She appears well-developed and well-nourished. No distress.  HENT:  Head: Normocephalic and atraumatic.  Right Ear: External ear normal.  Left Ear: External ear normal.  Nose: Nose normal.  Mouth/Throat: Uvula is midline, oropharynx is clear and moist and mucous membranes are normal.  Eyes: Conjunctivae, EOM and lids are normal. Pupils are equal, round, and reactive to light. Right eye exhibits no discharge. Left eye exhibits no discharge. No scleral icterus.  Neck: Normal range of motion. Neck supple.  Cardiovascular: Normal rate, regular rhythm, normal heart sounds, intact distal pulses and normal pulses.   Pulmonary/Chest: Effort normal and breath sounds normal. No respiratory distress. She has no wheezes. She has no rales.  Abdominal: Soft. Normal appearance and bowel sounds are normal. She exhibits no distension and no mass. There is tenderness. There is no rigidity, no rebound and no guarding.  Mild diffuse tenderness to palpation.  Musculoskeletal: Normal range of motion. She exhibits no edema or tenderness.  No tenderness to palpation to spine or paraspinal muscles.  Neurological: She is  alert and oriented to person, place, and time. She has normal strength and normal reflexes. No sensory deficit.  Skin: Skin is warm, dry and intact. No rash noted. She is not diaphoretic. No erythema. No pallor.  Psychiatric: She has a normal mood and affect. Her speech is normal and behavior is normal.  Nursing note and vitals reviewed.   ED Course  Procedures (including critical care time)  Labs Review Labs Reviewed  COMPREHENSIVE METABOLIC PANEL - Abnormal; Notable for the following:    Glucose, Bld 111 (*)    BUN 22 (*)    Creatinine, Ser 1.43 (*)    GFR calc non Af Amer 38 (*)    GFR calc Af Amer 44 (*)    All other components within normal limits  URINALYSIS, ROUTINE W REFLEX MICROSCOPIC (NOT AT [facility]) - Abnormal; Notable for the following:    Color, Urine  AMBER (*)    APPearance CLOUDY (*)    Specific Gravity, Urine 1.036 (*)    Bilirubin Urine SMALL (*)    Leukocytes, UA SMALL (*)    All other components within normal limits  CBC - Abnormal; Notable for the following:    RBC 5.38 (*)    MCV 73.4 (*)    MCH 23.4 (*)    All other components within normal limits  URINE MICROSCOPIC-ADD ON - Abnormal; Notable for the following:    Squamous Epithelial / LPF 0-5 (*)    Bacteria, UA RARE (*)    Casts HYALINE CASTS (*)    All other components within normal limits  URINE CULTURE  LIPASE, BLOOD    Imaging Review Ct Abdomen Pelvis W Contrast  08/04/2015  CLINICAL DATA:  Back pain and generalized abdominal pain for 1 day. EXAM: CT ABDOMEN AND PELVIS WITH CONTRAST TECHNIQUE: Multidetector CT imaging of the abdomen and pelvis was performed using the standard protocol following bolus administration of intravenous contrast. CONTRAST:  32mL ISOVUE-300 IOPAMIDOL (ISOVUE-300) INJECTION 61% COMPARISON:  08/25/2003 FINDINGS: Lower chest: Chronic opacities in the right base are stable from 08/25/2003. No significant abnormality. Hepatobiliary: Multiple calculi within the gallbladder lumen.  The liver and bile ducts are unremarkable. Pancreas: Normal Spleen: Normal Adrenals/Urinary Tract: There is scarring of the right renal upper pole, unchanged. The kidneys are otherwise normal. No collecting system calculi. Ureters and urinary bladder are unremarkable. Both adrenals are normal. Stomach/Bowel: There are normal appearances of the stomach, small bowel and colon. The appendix is normal. Vascular/Lymphatic: The abdominal aorta is normal in caliber. There is mild atherosclerotic calcification. There is no adenopathy in the abdomen or pelvis. Reproductive: Uterus and adnexal structures are unremarkable. Other: No acute inflammatory changes are evident in the abdomen or pelvis. There is no ascites. Musculoskeletal: No significant skeletal lesions. IMPRESSION: 1. Cholelithiasis 2. Chronic scarring of the right renal upper pole. Electronically Signed   By: Andreas Newport M.D.   On: 08/04/2015 22:57   I have personally reviewed and evaluated these images and lab results as part of my medical decision-making.   EKG Interpretation None      MDM   Final diagnoses:  Abdominal pain  Calculus of gallbladder without cholecystitis without obstruction    65 year old female presents with generalized abdominal pain and low back pain for the past day. Reports nausea, but denies vomiting. Also notes diarrhea. Denies hematochezia or melena.  Denies dysuria, urgency, frequency, bowel or bladder incontinence, saddle anesthesia, history of malignancy, IV drug use, anticoagulant use, numbness, weakness. Reports tingling in her hands bilaterally, which she attributes to diabetes and notes is unchanged from baseline.  Patient is afebrile. Vital signs stable. Heart regular rate and rhythm. Lungs clear to auscultation bilaterally. Abdomen soft, nondistended, with mild diffuse tenderness to palpation. No rebound, guarding, or masses. No tenderness to palpation to spine or paraspinal muscles. No palpable step-off  or deformity. Normal neuro exam with no focal deficit. Patient ambulates without difficulty.  CBC negative for leukocytosis or anemia. CMP remarkable for creatinine 1.43. Lipase within normal limits. UA with small leukocytes, 0-5 WBC, rare bacteria. Low suspicion for UTI. Urine culture ordered. CT abdomen pelvis obtained, remarkable for cholelithiasis; abdominal aorta normal in caliber, no acute inflammatory changes in the abdomen or pelvis, no ascites, no skeletal lesions.  Patient given fluids and pain medication. On reassessment, she reports significant symptom improvement. She states her pain is resolved. Patient is nontoxic and well-appearing, feel she is stable  for discharge at this time. Patient to follow-up with PCP and with general surgery for further evaluation and management of cholelithiasis. Strict return precautions discussed. Patient verbalizes her understanding and is in agreement with plan.  BP 116/71 mmHg  Pulse 79  Temp(Src) 98.8 F (37.1 C) (Oral)  Resp 17  Ht 5\' 3"  (1.6 m)  Wt 84.823 kg  BMI 33.13 kg/m2  SpO2 98%        Marella Chimes, PA-C 08/05/15 [place], MD 08/06/15 [phone]

## 2015-08-04 NOTE — ED Notes (Signed)
Patient transported to CT 

## 2015-08-06 LAB — URINE CULTURE

## 2015-08-07 ENCOUNTER — Ambulatory Visit (INDEPENDENT_AMBULATORY_CARE_PROVIDER_SITE_OTHER): Payer: BC Managed Care – PPO | Admitting: Internal Medicine

## 2015-08-07 ENCOUNTER — Encounter: Payer: Self-pay | Admitting: Internal Medicine

## 2015-08-07 ENCOUNTER — Other Ambulatory Visit (INDEPENDENT_AMBULATORY_CARE_PROVIDER_SITE_OTHER): Payer: BC Managed Care – PPO

## 2015-08-07 VITALS — BP 130/82 | HR 80 | Temp 98.5°F | Ht 64.0 in | Wt 180.0 lb

## 2015-08-07 DIAGNOSIS — R109 Unspecified abdominal pain: Secondary | ICD-10-CM | POA: Insufficient documentation

## 2015-08-07 DIAGNOSIS — R10819 Abdominal tenderness, unspecified site: Secondary | ICD-10-CM

## 2015-08-07 DIAGNOSIS — K8020 Calculus of gallbladder without cholecystitis without obstruction: Secondary | ICD-10-CM | POA: Diagnosis not present

## 2015-08-07 DIAGNOSIS — I10 Essential (primary) hypertension: Secondary | ICD-10-CM | POA: Diagnosis not present

## 2015-08-07 DIAGNOSIS — R1010 Upper abdominal pain, unspecified: Secondary | ICD-10-CM

## 2015-08-07 LAB — URINALYSIS, ROUTINE W REFLEX MICROSCOPIC
Bilirubin Urine: NEGATIVE
Hgb urine dipstick: NEGATIVE
Ketones, ur: NEGATIVE
LEUKOCYTES UA: NEGATIVE
NITRITE: NEGATIVE
PH: 5 (ref 5.0–8.0)
RBC / HPF: NONE SEEN (ref 0–?)
SPECIFIC GRAVITY, URINE: 1.015 (ref 1.000–1.030)
TOTAL PROTEIN, URINE-UPE24: NEGATIVE
URINE GLUCOSE: NEGATIVE
UROBILINOGEN UA: 0.2 (ref 0.0–1.0)

## 2015-08-07 MED ORDER — PANTOPRAZOLE SODIUM 40 MG PO TBEC: 40.0000 mg | DELAYED_RELEASE_TABLET | Freq: Every day | ORAL | Status: DC

## 2015-08-07 NOTE — Progress Notes (Signed)
Pre visit review using our clinic review tool, if applicable. No additional management support is needed unless otherwise documented below in the visit note. 

## 2015-08-07 NOTE — Progress Notes (Signed)
Subjective:    Patient ID: Deborah Neal, female    DOB: Dec 16, 1950, 65 y.o.   MRN: [id number]  HPI  Here to f/u after recently seen at ED apr 10 with lower abd pain with appropriate eval neg for acute except for finding cholelithiasis.  Pt today states her discomfort is upper abd, points to mid upper, but has soreness to the right as well.   Pt denies fever, wt loss, night sweats, loss of appetite, or other constitutional symptoms   Denies urinary symptoms such as dysuria, frequency, urgency, flank pain, hematuria or n/v, fever, chills. Pt denies chest pain, increased sob or doe, wheezing, orthopnea, PND, increased LE swelling, palpitations, dizziness or syncope.  Pt denies new neurological symptoms such as new headache, or facial or extremity weakness or numbness  Has not tried antacid. Past Medical History  Diagnosis Date  . ALLERGIC RHINITIS 01/29/2007  . ASTHMA, WITH ACUTE EXACERBATION 06/24/2008  . ASTHMA 01/29/2007  . BACK PAIN 01/29/2009  . CHEST PAIN 06/24/2008  . HYPERLIPIDEMIA 01/29/2007  . HYPERTENSION 01/29/2007  . OSTEOPENIA 10/16/2007  . Type II or unspecified type diabetes mellitus without mention of complication, uncontrolled 11/29/2013   Past Surgical History  Procedure Laterality Date  . Video bronchoscopy Bilateral 03/28/2014    Procedure: VIDEO BRONCHOSCOPY WITHOUT FLUORO;  Surgeon: Tanda Rockers, MD;  Location: [facility];  Service: Cardiopulmonary;  Laterality: Bilateral;    reports that she quit smoking about 11 years ago. She has never used smokeless tobacco. She reports that she does not drink alcohol or use illicit drugs. family history includes Asthma in her daughter and father; Cancer in her mother; Diabetes in her father; Heart disease in her father; Hypertension in her father. No Known Allergies Current Outpatient Prescriptions on File Prior to Visit  Medication Sig Dispense Refill  . albuterol (PROVENTIL HFA;VENTOLIN HFA) 108 (90 Base) MCG/ACT inhaler Inhale 2 puffs  into the lungs every 6 (six) hours as needed for wheezing or shortness of breath.    Marland Kitchen alendronate (FOSAMAX) 70 MG tablet TAKE ONE TABLET BY MOUTH ONCE EVERY 7 DAYS. TAKE  WITH  A  FULL  GLASS  OF  WATER  AND  ON  AN  EMPTY  STOMACH 12 tablet 1  . amLODipine-benazepril (LOTREL) 5-20 MG capsule TAKE TWO CAPSULES BY MOUTH ONCE DAILY. 180 capsule 1  . aspirin 81 MG tablet Take 81 mg by mouth every morning.     . cyclobenzaprine (FLEXERIL) 5 MG tablet 1-2 qhs prn (Patient taking differently: Take 5-10 mg by mouth at bedtime as needed for muscle spasms. ) 14 tablet 0  . ferrous sulfate 325 (65 FE) MG tablet Take 325 mg by mouth daily with breakfast.    . hydrochlorothiazide (HYDRODIURIL) 25 MG tablet TAKE ONE TABLET BY MOUTH ONCE DAILY 90 tablet 1  . metFORMIN (GLUCOPHAGE-XR) 500 MG 24 hr tablet Take 2 tablets (1,000 mg total) by mouth daily with breakfast. 180 tablet 3  . Multiple Vitamin (MULTIVITAMIN WITH MINERALS) TABS tablet Take 1 tablet by mouth daily.    . ondansetron (ZOFRAN ODT) 4 MG disintegrating tablet Take 1 tablet (4 mg total) by mouth every 8 (eight) hours as needed for nausea. 10 tablet 0  . potassium chloride (K-DUR) 10 MEQ tablet TAKE TWO TABLETS BY MOUTH ONCE DAILY 180 tablet 0  . rosuvastatin (CRESTOR) 20 MG tablet Take 1 tablet (20 mg total) by mouth daily. 90 tablet 3  . traMADol (ULTRAM) 50 MG tablet Take 1 tablet (  50 mg total) by mouth every 6 (six) hours as needed. 15 tablet 0   No current facility-administered medications on file prior to visit.    Review of Systems  Constitutional: Negative for unusual diaphoresis or night sweats HENT: Negative for ear swelling or discharge Eyes: Negative for worsening visual haziness  Respiratory: Negative for choking and stridor.   Gastrointestinal: Negative for distension or worsening eructation Genitourinary: Negative for retention or change in urine volume.  Musculoskeletal: Negative for other MSK pain or swelling Skin: Negative  for color change and worsening wound Neurological: Negative for tremors and numbness other than noted  Psychiatric/Behavioral: Negative for decreased concentration or agitation other than above       Objective:   Physical Exam BP 130/82 mmHg  Pulse 80  Temp(Src) 98.5 F (36.9 C) (Oral)  Ht 5\' 4"  (1.626 m)  Wt 180 lb (81.647 kg)  BMI 30.88 kg/m2  SpO2 97% VS noted,  Constitutional: Pt appears in no apparent distress HENT: Head: NCAT.  Right Ear: External ear normal.  Left Ear: External ear normal.  Eyes: . Pupils are equal, round, and reactive to light. Conjunctivae and EOM are normal Neck: Normal range of motion. Neck supple.  Cardiovascular: Normal rate and regular rhythm.   Pulmonary/Chest: Effort normal and breath sounds without rales or wheezing.  Abd:  Soft, ND, + BS, has tenderness to mid epigastric and RUQ, as well as surprising low mid abd tender as well, without guarding or rebound Neurological: Pt is alert. Not confused , motor grossly intact Skin: Skin is warm. No rash, no LE edema Psychiatric: Pt behavior is normal. No agitation.    Lab Results  Component Value Date   WBC 5.0 08/04/2015   HGB 12.6 08/04/2015   HCT 39.5 08/04/2015   PLT 231 08/04/2015   GLUCOSE 111* 08/04/2015   CHOL 156 03/18/2015   TRIG 120.0 03/18/2015   HDL 45.50 03/18/2015   LDLDIRECT 153.4 01/29/2009   LDLCALC 87 03/18/2015   ALT 31 08/04/2015   AST 30 08/04/2015   NA 140 08/04/2015   K 4.0 08/04/2015   CL 108 08/04/2015   CREATININE 1.43* 08/04/2015   BUN 22* 08/04/2015   CO2 24 08/04/2015   TSH 3.83 09/13/2014   HGBA1C 7.5* 03/18/2015   MICROALBUR 0.7 09/13/2014   Most recent CT: impression Aug 04, 2015  IMPRESSION: 1. Cholelithiasis 2. Chronic scarring of the right renal upper pole.     Assessment & Plan:

## 2015-08-07 NOTE — Assessment & Plan Note (Signed)
stable overall by history and exam, recent data reviewed with pt, and pt to continue medical treatment as before,  to f/u any worsening symptoms or concerns BP Readings from Last 3 Encounters:  08/07/15 130/82  08/04/15 116/71  03/18/15 148/82

## 2015-08-07 NOTE — Assessment & Plan Note (Signed)
Low mid abd, cant r/o incidental cystitis vs other- for repeat UA today

## 2015-08-07 NOTE — Patient Instructions (Signed)
Please take all new medication as prescribed - the protonix for the stomach  Please continue all other medications as before, and refills have been done if requested.  Please have the pharmacy call with any other refills you may need.  Please keep your appointments with your specialists as you may have planned  You will be contacted regarding the referral for: General Surgury  Please go to the LAB in the Basement (turn left off the elevator) for the tests to be done today  You will be contacted by phone if any changes need to be made immediately.  Otherwise, you will receive a letter about your results with an explanation, but please check with MyChart first.  Please remember to sign up for MyChart if you have not done so, as this will be important to you in the future with finding out test results, communicating by private email, and scheduling acute appointments online when needed.

## 2015-08-07 NOTE — Assessment & Plan Note (Signed)
Upper abd epigstric and RUQ, with recent finding gallstones, for PPI trial, also refer gen surgury (pt has not yet called, hesitant to pursue surgury but will if referred),  to f/u any worsening symptoms or concerns

## 2015-08-22 ENCOUNTER — Ambulatory Visit: Payer: Self-pay | Admitting: General Surgery

## 2015-08-22 NOTE — H&P (Signed)
Deborah Neal 08/22/2015 11:27 AM Location: [facility] Patient #: [id number] DOB: [birth year] Single / Language: Deborah Neal / Race: Black or African American Female  History of Present Illness [name] MD; 08/22/2015 12:27 PM) The patient is a 65 year old female.   Note:She is referred by [name] for consultation due to upper abdominal pain and gallstones. Earlier this month, she went to work and had severe epigastric and right upper quadrant pain that radiated around to the back and was associated with nausea. The pain was so bad that she left work and went to the emergency room. While there, CT scan was performed which demonstrated cholelithiasis. It was felt this was the source of her pain. She improved with IV fluid hydration and pain medication and was discharged from the emergency department. A few days later, she saw [name] who then referred her here. She states she's not had any other episodes like that. She denies family history of gallbladder disease. She is unaware of any foods that may have caused this. While in the emergency room, her white blood cell count was normal and her liver function tests were not elevated.  Her past medical history is notable for diabetes mellitus (she does not routinely check her blood sugars) (, hypertension, hyperlipidemia, asthma  She works as a Secretary/administrator. Her 109 year old daughter lives with her.  Other Problems Elbert Ewings, CMA; 08/22/2015 11:27 AM) Asthma Diabetes Mellitus High blood pressure Hypercholesterolemia  Past Surgical History Elbert Ewings, CMA; 08/22/2015 11:27 AM) No pertinent past surgical history  Diagnostic Studies History Elbert Ewings, CMA; 08/22/2015 11:27 AM) Colonoscopy 1-5 years ago Mammogram within last year Pap Smear >5 years ago  Allergies Elbert Ewings, CMA; 08/22/2015 11:28 AM) No Known Drug Allergies 08/22/2015  Medication History Elbert Ewings, CMA; 08/22/2015 11:31  AM) MetFORMIN HCl ER (500MG  Tablet ER 24HR, Oral) Active. Pantoprazole Sodium (40MG  Tablet DR, Oral) Active. Rosuvastatin Calcium (20MG  Tablet, Oral) Active. Aspirin (81MG  Tablet, Oral) Active. Albuterol Sulfate HFA (108MCG/ACT Aerosol Soln, Inhalation) Active. Ferrous Fumarate (325 (106 Fe)MG Tablet, Oral) Active. Multiple Vitamin (Oral) Active. Protonix (40MG  Tablet DR, Oral) Active. Zofran (4MG  Tablet, Oral) Active. TraMADol HCl (50MG  Tablet, Oral) Active. Alendronate Sodium (10MG  Tablet, Oral) Active. Amlodipine Besy-Benazepril HCl (5-20MG  Capsule, Oral) Active. HydroCHLOROthiazide (12.5MG  Capsule, Oral) Active. Potassium (75MG  Tablet, Oral) Active. Flexeril (5MG  Tablet, Oral) Active. Medications Reconciled  Social History Elbert Ewings, [place]; 08/22/2015 11:27 AM) Caffeine use Coffee. No drug use  Family History Elbert Ewings, [place]; 08/22/2015 11:27 AM) Cancer Mother. Diabetes Mellitus Father. Heart Disease Father. Hypertension Father.  Pregnancy / Birth History Elbert Ewings, CMA; 08/22/2015 11:27 AM) Age at menarche 20 years. Age of menopause 9-50 Gravida 5 Irregular periods Maternal age <15 Para 4     Review of Systems Elbert Ewings CMA; 08/22/2015 11:27 AM) General Not Present- Appetite Loss, Chills, Fatigue, Fever, Night Sweats, Weight Gain and Weight Loss. Skin Not Present- Change in Wart/Mole, Dryness, Hives, Jaundice, New Lesions, Non-Healing Wounds, Rash and Ulcer. HEENT Present- Wears glasses/contact lenses. Not Present- Earache, Hearing Loss, Hoarseness, Nose Bleed, Oral Ulcers, Ringing in the Ears, Seasonal Allergies, Sinus Pain, Sore Throat, Visual Disturbances and Yellow Eyes. Respiratory Present- Wheezing. Not Present- Bloody sputum, Chronic Cough, Difficulty Breathing and Snoring. Breast Not Present- Breast Mass, Breast Pain, Nipple Discharge and Skin Changes. Cardiovascular Present- Shortness of Breath. Not Present- Chest Pain,  Difficulty Breathing Lying Down, Leg Cramps, Palpitations, Rapid Heart Rate and Swelling of Extremities. Gastrointestinal Not Present- Abdominal Pain, Bloating, Bloody Stool,  Change in Bowel Habits, Chronic diarrhea, Constipation, Difficulty Swallowing, Excessive gas, Gets full quickly at meals, Hemorrhoids, Indigestion, Nausea, Rectal Pain and Vomiting. Female Genitourinary Not Present- Frequency, Nocturia, Painful Urination, Pelvic Pain and Urgency. Musculoskeletal Not Present- Back Pain, Joint Pain, Joint Stiffness, Muscle Pain, Muscle Weakness and Swelling of Extremities. Neurological Not Present- Decreased Memory, Fainting, Headaches, Numbness, Seizures, Tingling, Tremor, Trouble walking and Weakness. Psychiatric Not Present- Anxiety, Bipolar, Change in Sleep Pattern, Depression, Fearful and Frequent crying. Endocrine Not Present- Cold Intolerance, Excessive Hunger, Hair Changes, Heat Intolerance, Hot flashes and New Diabetes. Hematology Not Present- Easy Bruising, Excessive bleeding, Gland problems, HIV and Persistent Infections.  Vitals Elbert Ewings CMA; 08/22/2015 11:32 AM) 08/22/2015 11:31 AM Weight: 180 lb Height: 64in Body Surface Area: 1.87 m Body Mass Index: 30.9 kg/m  Temp.: 98.67F  Pulse: 90 (Regular)  BP: 132/86 (Sitting, Left Arm, Standard)      Physical Exam [name] MD; 08/22/2015 12:26 PM)  The physical exam findings are as follows: Note:General: Overweight female in NAD. Pleasant and cooperative  EYES: EOMI, no icterus  CV: RRR, no murmur, no JVD.  CHEST: Breath sounds equal and clear. Respirations nonlabored.  ABDOMEN: Soft, mild RUQ and epigastric tenderness, no masses, no organomegaly, active bowel sounds, no scars, no hernias.  NEUROLOGIC: Alert and oriented, answers questions appropriately, normal gait and station.  PSYCHIATRIC: Normal mood, affect , and behavior.    Assessment & Plan [name] MD; 08/22/2015 12:27  PM)  SYMPTOMATIC CHOLELITHIASIS (K80.20) Impression: We discussed doing a laparoscopic possible open cholecystectomy for this and she is interested in scheduling the surgery.  Plan: Strict low fat diet. Laparoscopic cholecystectomy with cholangiogram. I have explained the procedure, risks, and aftercare of cholecystectomy. Risks include but are not limited to bleeding, infection, wound problems, anesthesia, diarrhea, bile leak, injury to common bile duct/liver/intestine. She seems to understand and agrees to proceed.  Jackolyn Confer, MD

## 2015-09-11 NOTE — Pre-Procedure Instructions (Signed)
IDOLINA KAPPENMAN  09/11/2015     Your procedure is scheduled on : Friday Sep 19, 2015 at 10:45 AM.  Report to [facility] Admitting at 8:45 AM.  Call this number if you have problems the morning of surgery: [phone]    Remember:  Do not eat food or drink liquids after midnight.  Take these medicines the morning of surgery with A SIP OF WATER : Albuterol inhaler if needed (bring inhaler with you), Amlodipine, Ondansetron (Zofran) if needed, Pantoprazole (Protonix), Tramadol (Ultram) if needed   Stop taking any aspirin, vitamins, herbal medications/supplements, NSAIDs, Ibuprofen, Advil, Motrin, Aleve, etc today   Do NOT take any diabetic pills the morning of your surgery (NO Metformin/Glucophage)     How to Manage Your Diabetes Before and After Surgery  Why is it important to control my blood sugar before and after surgery? . Improving blood sugar levels before and after surgery helps healing and can limit problems. . A way of improving blood sugar control is eating a healthy diet by: o  Eating less sugar and carbohydrates o  Increasing activity/exercise o  Talking with your doctor about reaching your blood sugar goals . High blood sugars (greater than 180 mg/dL) can raise your risk of infections and slow your recovery, so you will need to focus on controlling your diabetes during the weeks before surgery. . Make sure that the doctor who takes care of your diabetes knows about your planned surgery including the date and location.  How do I manage my blood sugar before surgery? . Check your blood sugar at least 4 times a day, starting 2 days before surgery, to make sure that the level is not too high or low. o Check your blood sugar the morning of your surgery when you wake up and every 2 hours until you get to the Short Stay unit. . If your blood sugar is less than 70 mg/dL, you will need to treat for low blood sugar: o Do not take insulin. o Treat a low blood sugar (less  than 70 mg/dL) with  cup of clear juice (cranberry or apple), 4 glucose tablets, OR glucose gel. o Recheck blood sugar in 15 minutes after treatment (to make sure it is greater than 70 mg/dL). If your blood sugar is not greater than 70 mg/dL on recheck, call [phone] for further instructions. . Report your blood sugar to the short stay nurse when you get to Short Stay.  . If you are admitted to the hospital after surgery: o Your blood sugar will be checked by the staff and you will probably be given insulin after surgery (instead of oral diabetes medicines) to make sure you have good blood sugar levels. o The goal for blood sugar control after surgery is 80-180 mg/dL.     WHAT DO I DO ABOUT MY DIABETES MEDICATION?   Marland Kitchen Do not take oral diabetes medicines (pills) the morning of surgery.  Reviewed and Endorsed by [facility] Patient Education Committee, August 2015   Do not wear jewelry, make-up or nail polish.  Do not wear lotions, powders, or perfumes.    Do not shave 48 hours prior to surgery.    Do not bring valuables to the hospital.  [facility] is not responsible for any belongings or valuables.  Contacts, dentures or bridgework may not be worn into surgery.  Leave your suitcase in the car.  After surgery it may be brought to your room.  For patients admitted to  the hospital, discharge time will be determined by your treatment team.  Patients discharged the day of surgery will not be allowed to drive home.   Name and phone number of your driver:    Special instructions:  Shower using CHG soap the night before and the morning of your surgery  Please read over the following fact sheets that you were given. Pain Booklet, Coughing and Deep Breathing and Surgical Site Infection Prevention

## 2015-09-12 ENCOUNTER — Encounter (HOSPITAL_COMMUNITY)
Admission: RE | Admit: 2015-09-12 | Discharge: 2015-09-12 | Disposition: A | Discharge status: Home / Self Care | Payer: BC Managed Care – PPO | Source: Ambulatory Visit | Attending: General Surgery | Admitting: General Surgery

## 2015-09-12 ENCOUNTER — Encounter (HOSPITAL_COMMUNITY): Payer: Self-pay

## 2015-09-12 DIAGNOSIS — Z01812 Encounter for preprocedural laboratory examination: Secondary | ICD-10-CM | POA: Insufficient documentation

## 2015-09-12 DIAGNOSIS — K8020 Calculus of gallbladder without cholecystitis without obstruction: Secondary | ICD-10-CM | POA: Diagnosis not present

## 2015-09-12 DIAGNOSIS — Z01818 Encounter for other preprocedural examination: Secondary | ICD-10-CM | POA: Insufficient documentation

## 2015-09-12 DIAGNOSIS — I10 Essential (primary) hypertension: Secondary | ICD-10-CM | POA: Diagnosis not present

## 2015-09-12 DIAGNOSIS — R9431 Abnormal electrocardiogram [ECG] [EKG]: Secondary | ICD-10-CM | POA: Insufficient documentation

## 2015-09-12 HISTORY — DX: Personal history of other infectious and parasitic diseases: Z86.19

## 2015-09-12 HISTORY — DX: Gastro-esophageal reflux disease without esophagitis: K21.9

## 2015-09-12 HISTORY — DX: Family history of other specified conditions: Z84.89

## 2015-09-12 HISTORY — DX: Unspecified osteoarthritis, unspecified site: M19.90

## 2015-09-12 LAB — CBC
HCT: 37.5 % (ref 36.0–46.0)
Hemoglobin: 11.3 g/dL — ABNORMAL LOW (ref 12.0–15.0)
MCH: 23 pg — ABNORMAL LOW (ref 26.0–34.0)
MCHC: 30.1 g/dL (ref 30.0–36.0)
MCV: 76.2 fL — AB (ref 78.0–100.0)
PLATELETS: 241 10*3/uL (ref 150–400)
RBC: 4.92 MIL/uL (ref 3.87–5.11)
RDW: 14.9 % (ref 11.5–15.5)
WBC: 6.4 10*3/uL (ref 4.0–10.5)

## 2015-09-12 LAB — BASIC METABOLIC PANEL WITH GFR
Anion gap: 10 (ref 5–15)
BUN: 14 mg/dL (ref 6–20)
CO2: 29 mmol/L (ref 22–32)
Calcium: 9.7 mg/dL (ref 8.9–10.3)
Chloride: 104 mmol/L (ref 101–111)
Creatinine, Ser: 0.86 mg/dL (ref 0.44–1.00)
GFR calc Af Amer: >60 mL/min
GFR calc non Af Amer: >60 mL/min
Glucose, Bld: 129 mg/dL — ABNORMAL HIGH (ref 65–99)
Potassium: 3.6 mmol/L (ref 3.5–5.1)
Sodium: 143 mmol/L (ref 135–145)

## 2015-09-12 LAB — GLUCOSE, CAPILLARY: Glucose-Capillary: 117 mg/dL — ABNORMAL HIGH (ref 65–99)

## 2015-09-12 NOTE — Progress Notes (Signed)
PCP is [name]  Patient arrived to PAT accompanied by her daughter Joycelyn Schmid  Patient denied having any acute cardiac or pulmonary issues. Patient stated she has not used her Albuterol inhaler in a long time.   Nurse inquired about blood glucose and patient stated she does not check her blood sugars at home. CBG on arrival to PAT was 117, and patient denied having any food or beverage consumption today.

## 2015-09-13 LAB — HEMOGLOBIN A1C
HEMOGLOBIN A1C: 7.9 % — AB (ref 4.8–5.6)
MEAN PLASMA GLUCOSE: 180 mg/dL

## 2015-09-16 ENCOUNTER — Encounter: Payer: Self-pay | Admitting: Internal Medicine

## 2015-09-16 ENCOUNTER — Ambulatory Visit (INDEPENDENT_AMBULATORY_CARE_PROVIDER_SITE_OTHER): Payer: BC Managed Care – PPO | Admitting: Internal Medicine

## 2015-09-16 ENCOUNTER — Other Ambulatory Visit (INDEPENDENT_AMBULATORY_CARE_PROVIDER_SITE_OTHER): Payer: BC Managed Care – PPO

## 2015-09-16 VITALS — BP 140/80 | HR 90 | Temp 98.5°F | Resp 20 | Wt 180.0 lb

## 2015-09-16 DIAGNOSIS — I10 Essential (primary) hypertension: Secondary | ICD-10-CM | POA: Diagnosis not present

## 2015-09-16 DIAGNOSIS — E785 Hyperlipidemia, unspecified: Secondary | ICD-10-CM | POA: Diagnosis not present

## 2015-09-16 DIAGNOSIS — D649 Anemia, unspecified: Secondary | ICD-10-CM | POA: Insufficient documentation

## 2015-09-16 DIAGNOSIS — Z0001 Encounter for general adult medical examination with abnormal findings: Secondary | ICD-10-CM | POA: Diagnosis not present

## 2015-09-16 DIAGNOSIS — E119 Type 2 diabetes mellitus without complications: Secondary | ICD-10-CM | POA: Diagnosis not present

## 2015-09-16 DIAGNOSIS — R6889 Other general symptoms and signs: Secondary | ICD-10-CM

## 2015-09-16 DIAGNOSIS — D6489 Other specified anemias: Secondary | ICD-10-CM

## 2015-09-16 LAB — CBC WITH DIFFERENTIAL/PLATELET
BASOS ABS: 0 10*3/uL (ref 0.0–0.1)
Basophils Relative: 0.5 % (ref 0.0–3.0)
EOS ABS: 0.1 10*3/uL (ref 0.0–0.7)
Eosinophils Relative: 1.9 % (ref 0.0–5.0)
HEMATOCRIT: 38.4 % (ref 36.0–46.0)
HEMOGLOBIN: 12.1 g/dL (ref 12.0–15.0)
LYMPHS PCT: 33.1 % (ref 12.0–46.0)
Lymphs Abs: 2.5 10*3/uL (ref 0.7–4.0)
MCHC: 31.5 g/dL (ref 30.0–36.0)
MCV: 74.9 fl — ABNORMAL LOW (ref 78.0–100.0)
Monocytes Absolute: 0.4 10*3/uL (ref 0.1–1.0)
Monocytes Relative: 5.6 % (ref 3.0–12.0)
Neutro Abs: 4.5 10*3/uL (ref 1.4–7.7)
Neutrophils Relative %: 58.9 % (ref 43.0–77.0)
PLATELETS: 260 10*3/uL (ref 150.0–400.0)
RBC: 5.13 Mil/uL — ABNORMAL HIGH (ref 3.87–5.11)
RDW: 15.1 % (ref 11.5–15.5)
WBC: 7.6 10*3/uL (ref 4.0–10.5)

## 2015-09-16 LAB — LIPID PANEL
CHOLESTEROL: 130 mg/dL (ref 0–200)
HDL: 41.8 mg/dL (ref >39.00)
LDL Cholesterol: 71 mg/dL (ref 0–99)
NonHDL: 88.27
Total CHOL/HDL Ratio: 3
Triglycerides: 85 mg/dL (ref 0.0–149.0)
VLDL: 17 mg/dL (ref 0.0–40.0)

## 2015-09-16 LAB — URINALYSIS, ROUTINE W REFLEX MICROSCOPIC
Bilirubin Urine: NEGATIVE
Hgb urine dipstick: NEGATIVE
Ketones, ur: NEGATIVE
Leukocytes, UA: NEGATIVE
Nitrite: NEGATIVE
PH: 5.5 (ref 5.0–8.0)
RBC / HPF: NONE SEEN (ref 0–?)
SPECIFIC GRAVITY, URINE: 1.015 (ref 1.000–1.030)
Total Protein, Urine: NEGATIVE
UROBILINOGEN UA: 0.2 (ref 0.0–1.0)
Urine Glucose: NEGATIVE
WBC UA: NONE SEEN (ref 0–?)

## 2015-09-16 LAB — IBC PANEL
IRON: 55 ug/dL (ref 42–145)
SATURATION RATIOS: 14.2 % — AB (ref 20.0–50.0)
TRANSFERRIN: 277 mg/dL (ref 212.0–360.0)

## 2015-09-16 LAB — HEPATIC FUNCTION PANEL
ALK PHOS: 74 U/L (ref 39–117)
ALT: 23 U/L (ref 0–35)
AST: 23 U/L (ref 0–37)
Albumin: 4.6 g/dL (ref 3.5–5.2)
BILIRUBIN DIRECT: 0.1 mg/dL (ref 0.0–0.3)
BILIRUBIN TOTAL: 0.3 mg/dL (ref 0.2–1.2)
Total Protein: 7.6 g/dL (ref 6.0–8.3)

## 2015-09-16 LAB — TSH: TSH: 3.13 u[IU]/mL (ref 0.35–4.50)

## 2015-09-16 LAB — MICROALBUMIN / CREATININE URINE RATIO
Creatinine,U: 110.2 mg/dL
MICROALB UR: 1.1 mg/dL (ref 0.0–1.9)
MICROALB/CREAT RATIO: 1 mg/g (ref 0.0–30.0)

## 2015-09-16 MED ORDER — METFORMIN HCL ER 500 MG PO TB24
ORAL_TABLET | ORAL | Status: DC
Start: 2015-09-16 — End: 2016-07-09

## 2015-09-16 NOTE — Progress Notes (Signed)
Subjective:    Patient ID: Deborah Neal, female    DOB: Jul 27, 1950, 65 y.o.   MRN: [id number]  HPI  Here for wellness and f/u;  Overall doing ok;  Pt denies Chest pain, worsening SOB, DOE, wheezing, orthopnea, PND, worsening LE edema, palpitations, dizziness or syncope.  Pt denies neurological change such as new headache, facial or extremity weakness.  Pt denies polydipsia, polyuria, or low sugar symptoms. Pt states overall good compliance with treatment and medications, good tolerability, and has been trying to follow appropriate diet.  Pt denies worsening depressive symptoms, suicidal ideation or panic. No fever, night sweats, wt loss, loss of appetite, or other constitutional symptoms.  Pt states good ability with ADL's, has low fall risk, home safety reviewed and adequate, no other significant changes in hearing or vision, and only occasionally active with exercise.   Pt denies polydipsia, polyuria, or low sugar symptoms such as weakness or confusion improved with po intake.  Pt states overall good compliance with meds, trying to follow lower cholesterol, diabetic diet, wt overall stable but little exercise however.     No overt bleeding, has new mild anemia Past Medical History  Diagnosis Date  . ALLERGIC RHINITIS 01/29/2007  . ASTHMA, WITH ACUTE EXACERBATION 06/24/2008  . ASTHMA 01/29/2007  . BACK PAIN 01/29/2009  . CHEST PAIN 06/24/2008  . HYPERLIPIDEMIA 01/29/2007  . HYPERTENSION 01/29/2007  . OSTEOPENIA 10/16/2007  . Type II or unspecified type diabetes mellitus without mention of complication, uncontrolled 11/29/2013  . Family history of adverse reaction to anesthesia     Patients daughter has N/V after anesthesia  . GERD (gastroesophageal reflux disease)   . Arthritis   . History of shingles    Past Surgical History  Procedure Laterality Date  . Video bronchoscopy Bilateral 03/28/2014    Procedure: VIDEO BRONCHOSCOPY WITHOUT FLUORO;  Surgeon: Tanda Rockers, MD;  Location: [facility];   Service: Cardiopulmonary;  Laterality: Bilateral;  . Colonoscopy    . Tonsillectomy    . Cholecystectomy N/A 09/19/2015    Procedure: LAPAROSCOPIC CHOLECYSTECTOMY WITH INTRAOPERATIVE CHOLANGIOGRAM;  Surgeon: Jackolyn Confer, MD;  Location: [facility];  Service: General;  Laterality: N/A;    reports that she quit smoking about 12 years ago. She has never used smokeless tobacco. She reports that she does not drink alcohol or use illicit drugs. family history includes Asthma in her daughter and father; Cancer in her mother; Diabetes in her father; Heart disease in her father; Hypertension in her father. No Known Allergies Current Outpatient Prescriptions on File Prior to Visit  Medication Sig Dispense Refill  . albuterol (PROVENTIL HFA;VENTOLIN HFA) 108 (90 Base) MCG/ACT inhaler Inhale 2 puffs into the lungs every 6 (six) hours as needed for wheezing or shortness of breath.    Marland Kitchen alendronate (FOSAMAX) 70 MG tablet TAKE ONE TABLET BY MOUTH ONCE EVERY 7 DAYS. TAKE  WITH  A  FULL  GLASS  OF  WATER  AND  ON  AN  EMPTY  STOMACH 12 tablet 1  . amLODipine-benazepril (LOTREL) 5-20 MG capsule TAKE TWO CAPSULES BY MOUTH ONCE DAILY. 180 capsule 1  . aspirin 81 MG tablet Take 81 mg by mouth every morning.     . cyclobenzaprine (FLEXERIL) 5 MG tablet 1-2 qhs prn (Patient taking differently: Take 5-10 mg by mouth at bedtime as needed for muscle spasms. ) 14 tablet 0  . ferrous sulfate 325 (65 FE) MG tablet Take 325 mg by mouth daily with breakfast.    .  hydrochlorothiazide (HYDRODIURIL) 25 MG tablet TAKE ONE TABLET BY MOUTH ONCE DAILY 90 tablet 1  . Multiple Vitamin (MULTIVITAMIN WITH MINERALS) TABS tablet Take 1 tablet by mouth daily.    . pantoprazole (PROTONIX) 40 MG tablet Take 1 tablet (40 mg total) by mouth daily. 90 tablet 3  . potassium chloride (K-DUR) 10 MEQ tablet TAKE TWO TABLETS BY MOUTH ONCE DAILY 180 tablet 0  . rosuvastatin (CRESTOR) 20 MG tablet Take 1 tablet (20 mg total) by mouth daily. 90 tablet 3    . traMADol (ULTRAM) 50 MG tablet Take 1 tablet (50 mg total) by mouth every 6 (six) hours as needed. 15 tablet 0  . ondansetron (ZOFRAN) 4 MG tablet Take 1 tablet (4 mg total) by mouth every 4 (four) hours as needed for nausea or vomiting. 20 tablet 0  . oxyCODONE (OXY IR/ROXICODONE) 5 MG immediate release tablet Take 1-2 tablets (5-10 mg total) by mouth every 4 (four) hours as needed for moderate pain, severe pain or breakthrough pain. 40 tablet 0   No current facility-administered medications on file prior to visit.   Review of Systems Constitutional: Negative for increased diaphoresis, or other activity, appetite or siginficant weight change other than noted HENT: Negative for worsening hearing loss, ear pain, facial swelling, mouth sores and neck stiffness.   Eyes: Negative for other worsening pain, redness or visual disturbance.  Respiratory: Negative for choking or stridor Cardiovascular: Negative for other chest pain and palpitations.  Gastrointestinal: Negative for worsening diarrhea, blood in stool, or abdominal distention Genitourinary: Negative for hematuria, flank pain or change in urine volume.  Musculoskeletal: Negative for myalgias or other joint complaints.  Skin: Negative for other color change and wound or drainage.  Neurological: Negative for syncope and numbness. other than noted Hematological: Negative for adenopathy. or other swelling Psychiatric/Behavioral: Negative for hallucinations, SI, self-injury, decreased concentration or other worsening agitation.      Objective:   Physical Exam BP 140/80 mmHg  Pulse 90  Temp(Src) 98.5 F (36.9 C) (Oral)  Resp 20  Wt 180 lb (81.647 kg)  SpO2 96% VS noted,  Constitutional: Pt is oriented to person, place, and time. Appears well-developed and well-nourished, in no significant distress Head: Normocephalic and atraumatic  Eyes: Conjunctivae and EOM are normal. Pupils are equal, round, and reactive to light Right Ear:  External ear normal.  Left Ear: External ear normal Nose: Nose normal.  Mouth/Throat: Oropharynx is clear and moist  Neck: Normal range of motion. Neck supple. No JVD present. No tracheal deviation present or significant neck LA or mass Cardiovascular: Normal rate, regular rhythm, normal heart sounds and intact distal pulses.   Pulmonary/Chest: Effort normal and breath sounds without rales or wheezing  Abdominal: Soft. Bowel sounds are normal. NT. No HSM  Musculoskeletal: Normal range of motion. Exhibits no edema Lymphadenopathy: Has no cervical adenopathy.  Neurological: Pt is alert and oriented to person, place, and time. Pt has normal reflexes. No cranial nerve deficit. Motor grossly intact Skin: Skin is warm and dry. No rash noted or new ulcers Psychiatric:  Has normal mood and affect. Behavior is normal.     Assessment & Plan:

## 2015-09-16 NOTE — Progress Notes (Signed)
Pre visit review using our clinic review tool, if applicable. No additional management support is needed unless otherwise documented below in the visit note. 

## 2015-09-16 NOTE — Patient Instructions (Addendum)
OK to increase the metformin to 2 pills in the AM and 1 pill in the PM  Please continue all other medications as before, and refills have been done if requested.  Please have the pharmacy call with any other refills you may need.  Please continue your efforts at being more active, low cholesterol diet, and weight control.  You are otherwise up to date with prevention measures today.  Please keep your appointments with your specialists as you may have planned  Please go to the LAB in the Basement (turn left off the elevator) for the tests to be done today  You will be contacted by phone if any changes need to be made immediately.  Otherwise, you will receive a letter about your results with an explanation, but please check with MyChart first.  Please remember to sign up for MyChart if you have not done so, as this will be important to you in the future with finding out test results, communicating by private email, and scheduling acute appointments online when needed.  Please return in 6 months, or sooner if needed, with Lab testing done 3-5 days before

## 2015-09-18 MED ORDER — CEFAZOLIN SODIUM-DEXTROSE 2-4 GM/100ML-% IV SOLN
2.0000 g | INTRAVENOUS | Status: AC
  Administered 2015-09-19: 2 g via INTRAVENOUS
  Filled 2015-09-18: qty 100

## 2015-09-19 ENCOUNTER — Ambulatory Visit (HOSPITAL_COMMUNITY): Payer: BC Managed Care – PPO | Admitting: Emergency Medicine

## 2015-09-19 ENCOUNTER — Encounter (HOSPITAL_COMMUNITY)
Admission: RE | Disposition: A | Discharge status: Home / Self Care | Payer: Self-pay | Source: Ambulatory Visit | Attending: General Surgery

## 2015-09-19 ENCOUNTER — Encounter (HOSPITAL_COMMUNITY): Payer: Self-pay | Admitting: *Deleted

## 2015-09-19 ENCOUNTER — Ambulatory Visit (HOSPITAL_COMMUNITY): Payer: BC Managed Care – PPO

## 2015-09-19 ENCOUNTER — Observation Stay (HOSPITAL_COMMUNITY)
Admission: RE | Admit: 2015-09-19 | Discharge: 2015-09-20 | Disposition: A | Discharge status: Home / Self Care | Payer: BC Managed Care – PPO | Source: Ambulatory Visit | Attending: General Surgery | Admitting: General Surgery

## 2015-09-19 ENCOUNTER — Ambulatory Visit (HOSPITAL_COMMUNITY): Payer: BC Managed Care – PPO | Admitting: Anesthesiology

## 2015-09-19 DIAGNOSIS — Z7984 Long term (current) use of oral hypoglycemic drugs: Secondary | ICD-10-CM | POA: Insufficient documentation

## 2015-09-19 DIAGNOSIS — J45909 Unspecified asthma, uncomplicated: Secondary | ICD-10-CM | POA: Insufficient documentation

## 2015-09-19 DIAGNOSIS — I10 Essential (primary) hypertension: Secondary | ICD-10-CM | POA: Diagnosis not present

## 2015-09-19 DIAGNOSIS — E119 Type 2 diabetes mellitus without complications: Secondary | ICD-10-CM | POA: Diagnosis not present

## 2015-09-19 DIAGNOSIS — K219 Gastro-esophageal reflux disease without esophagitis: Secondary | ICD-10-CM | POA: Diagnosis not present

## 2015-09-19 DIAGNOSIS — K8020 Calculus of gallbladder without cholecystitis without obstruction: Secondary | ICD-10-CM | POA: Diagnosis present

## 2015-09-19 DIAGNOSIS — Z9049 Acquired absence of other specified parts of digestive tract: Secondary | ICD-10-CM

## 2015-09-19 DIAGNOSIS — Z87891 Personal history of nicotine dependence: Secondary | ICD-10-CM | POA: Diagnosis not present

## 2015-09-19 DIAGNOSIS — K8010 Calculus of gallbladder with chronic cholecystitis without obstruction: Secondary | ICD-10-CM | POA: Diagnosis not present

## 2015-09-19 DIAGNOSIS — M1990 Unspecified osteoarthritis, unspecified site: Secondary | ICD-10-CM | POA: Diagnosis not present

## 2015-09-19 DIAGNOSIS — Z419 Encounter for procedure for purposes other than remedying health state, unspecified: Secondary | ICD-10-CM

## 2015-09-19 HISTORY — PX: CHOLECYSTECTOMY: SHX55

## 2015-09-19 LAB — GLUCOSE, CAPILLARY
GLUCOSE-CAPILLARY: 97 mg/dL (ref 65–99)
Glucose-Capillary: 121 mg/dL — ABNORMAL HIGH (ref 65–99)
Glucose-Capillary: 182 mg/dL — ABNORMAL HIGH (ref 65–99)

## 2015-09-19 SURGERY — LAPAROSCOPIC CHOLECYSTECTOMY WITH INTRAOPERATIVE CHOLANGIOGRAM
Anesthesia: General | Site: Abdomen

## 2015-09-19 MED ORDER — LIDOCAINE HCL 4 % EX SOLN
CUTANEOUS | Status: DC | PRN
  Administered 2015-09-19: 3 mL via TOPICAL

## 2015-09-19 MED ORDER — ACETAMINOPHEN 325 MG PO TABS: 650.0000 mg | ORAL_TABLET | ORAL | Status: DC | PRN

## 2015-09-19 MED ORDER — ACETAMINOPHEN 650 MG RE SUPP: 650.0000 mg | RECTAL | Status: DC | PRN

## 2015-09-19 MED ORDER — FENTANYL CITRATE (PF) 250 MCG/5ML IJ SOLN
INTRAMUSCULAR | Status: AC
  Filled 2015-09-19: qty 5

## 2015-09-19 MED ORDER — ROCURONIUM BROMIDE 100 MG/10ML IV SOLN
INTRAVENOUS | Status: DC | PRN
  Administered 2015-09-19: 10 mg via INTRAVENOUS
  Administered 2015-09-19: 40 mg via INTRAVENOUS

## 2015-09-19 MED ORDER — HYDROMORPHONE HCL 1 MG/ML IJ SOLN
INTRAMUSCULAR | Status: AC
  Administered 2015-09-19: 0.5 mg via INTRAVENOUS
  Filled 2015-09-19: qty 1

## 2015-09-19 MED ORDER — CYCLOBENZAPRINE HCL 5 MG PO TABS: 5.0000 mg | ORAL_TABLET | Freq: Every evening | ORAL | Status: DC | PRN

## 2015-09-19 MED ORDER — OXYCODONE HCL 5 MG PO TABS
5.0000 mg | ORAL_TABLET | ORAL | Status: DC | PRN
  Administered 2015-09-19: 10 mg via ORAL
  Filled 2015-09-19: qty 2

## 2015-09-19 MED ORDER — ONDANSETRON HCL 4 MG/2ML IJ SOLN
INTRAMUSCULAR | Status: DC | PRN
  Administered 2015-09-19: 4 mg via INTRAVENOUS

## 2015-09-19 MED ORDER — MIDAZOLAM HCL 5 MG/5ML IJ SOLN
INTRAMUSCULAR | Status: DC | PRN
  Administered 2015-09-19 (×2): 1 mg via INTRAVENOUS

## 2015-09-19 MED ORDER — SODIUM CHLORIDE 0.9 % IV SOLN
INTRAVENOUS | Status: DC
  Administered 2015-09-19: 19:00:00 via INTRAVENOUS

## 2015-09-19 MED ORDER — PROPOFOL 10 MG/ML IV BOLUS
INTRAVENOUS | Status: AC
  Filled 2015-09-19: qty 20

## 2015-09-19 MED ORDER — SODIUM CHLORIDE 0.9 % IR SOLN
Status: DC | PRN
  Administered 2015-09-19: 1000 mL

## 2015-09-19 MED ORDER — PANTOPRAZOLE SODIUM 40 MG PO TBEC: 40.0000 mg | DELAYED_RELEASE_TABLET | Freq: Every day | ORAL | Status: DC

## 2015-09-19 MED ORDER — MORPHINE SULFATE (PF) 2 MG/ML IV SOLN: 2.0000 mg | INTRAVENOUS | Status: DC | PRN

## 2015-09-19 MED ORDER — LIDOCAINE HCL (CARDIAC) 20 MG/ML IV SOLN
INTRAVENOUS | Status: DC | PRN
  Administered 2015-09-19: 60 mg via INTRAVENOUS

## 2015-09-19 MED ORDER — HEPARIN SODIUM (PORCINE) 5000 UNIT/ML IJ SOLN
5000.0000 [IU] | Freq: Three times a day (TID) | INTRAMUSCULAR | Status: DC
  Administered 2015-09-20: 5000 [IU] via SUBCUTANEOUS
  Filled 2015-09-19: qty 1

## 2015-09-19 MED ORDER — SUGAMMADEX SODIUM 200 MG/2ML IV SOLN
INTRAVENOUS | Status: DC | PRN
  Administered 2015-09-19: 160 mg via INTRAVENOUS

## 2015-09-19 MED ORDER — 0.9 % SODIUM CHLORIDE (POUR BTL) OPTIME
TOPICAL | Status: DC | PRN
  Administered 2015-09-19: 1000 mL

## 2015-09-19 MED ORDER — PROPOFOL 10 MG/ML IV BOLUS
INTRAVENOUS | Status: DC | PRN
  Administered 2015-09-19: 150 mg via INTRAVENOUS

## 2015-09-19 MED ORDER — BUPIVACAINE-EPINEPHRINE 0.25% -1:200000 IJ SOLN
INTRAMUSCULAR | Status: DC | PRN
  Administered 2015-09-19: 30 mL

## 2015-09-19 MED ORDER — SODIUM CHLORIDE 0.9 % IV SOLN: 250.0000 mL | INTRAVENOUS | Status: DC | PRN

## 2015-09-19 MED ORDER — HYDROMORPHONE HCL 1 MG/ML IJ SOLN
0.2500 mg | INTRAMUSCULAR | Status: DC | PRN
  Administered 2015-09-19 (×2): 0.5 mg via INTRAVENOUS

## 2015-09-19 MED ORDER — FENTANYL CITRATE (PF) 100 MCG/2ML IJ SOLN
INTRAMUSCULAR | Status: DC | PRN
  Administered 2015-09-19: 25 ug via INTRAVENOUS
  Administered 2015-09-19: 100 ug via INTRAVENOUS
  Administered 2015-09-19: 25 ug via INTRAVENOUS

## 2015-09-19 MED ORDER — ONDANSETRON HCL 4 MG/2ML IJ SOLN
INTRAMUSCULAR | Status: AC
  Administered 2015-09-19: 4 mg
  Filled 2015-09-19: qty 2

## 2015-09-19 MED ORDER — ONDANSETRON HCL 4 MG PO TABS: 4.0000 mg | ORAL_TABLET | ORAL | Status: DC | PRN

## 2015-09-19 MED ORDER — IOPAMIDOL (ISOVUE-300) INJECTION 61%
INTRAVENOUS | Status: AC
  Filled 2015-09-19: qty 50

## 2015-09-19 MED ORDER — SODIUM CHLORIDE 0.9% FLUSH: 3.0000 mL | INTRAVENOUS | Status: DC | PRN

## 2015-09-19 MED ORDER — MIDAZOLAM HCL 2 MG/2ML IJ SOLN
INTRAMUSCULAR | Status: AC
  Filled 2015-09-19: qty 2

## 2015-09-19 MED ORDER — DEXAMETHASONE SODIUM PHOSPHATE 10 MG/ML IJ SOLN
INTRAMUSCULAR | Status: DC | PRN
  Administered 2015-09-19: 5 mg via INTRAVENOUS

## 2015-09-19 MED ORDER — OXYCODONE HCL 5 MG PO TABS: 5.0000 mg | ORAL_TABLET | ORAL | Status: DC | PRN

## 2015-09-19 MED ORDER — ARTIFICIAL TEARS OP OINT
TOPICAL_OINTMENT | OPHTHALMIC | Status: DC | PRN
Start: 2015-09-19 — End: 2015-09-19
  Administered 2015-09-19: 1 via OPHTHALMIC

## 2015-09-19 MED ORDER — SODIUM CHLORIDE 0.9 % IV SOLN
INTRAVENOUS | Status: DC | PRN
  Administered 2015-09-19: 100 mL

## 2015-09-19 MED ORDER — BUPIVACAINE-EPINEPHRINE (PF) 0.25% -1:200000 IJ SOLN
INTRAMUSCULAR | Status: AC
  Filled 2015-09-19: qty 30

## 2015-09-19 MED ORDER — HYDROCHLOROTHIAZIDE 25 MG PO TABS
25.0000 mg | ORAL_TABLET | Freq: Every day | ORAL | Status: DC
  Administered 2015-09-19: 25 mg via ORAL
  Filled 2015-09-19: qty 1

## 2015-09-19 MED ORDER — SIMETHICONE 80 MG PO CHEW: 40.0000 mg | CHEWABLE_TABLET | Freq: Four times a day (QID) | ORAL | Status: DC | PRN

## 2015-09-19 MED ORDER — MORPHINE SULFATE (PF) 2 MG/ML IV SOLN: 1.0000 mg | INTRAVENOUS | Status: DC | PRN

## 2015-09-19 MED ORDER — DIPHENHYDRAMINE HCL 12.5 MG/5ML PO ELIX: 12.5000 mg | ORAL_SOLUTION | Freq: Four times a day (QID) | ORAL | Status: DC | PRN

## 2015-09-19 MED ORDER — LACTATED RINGERS IV SOLN
INTRAVENOUS | Status: DC
  Administered 2015-09-19: 08:00:00 via INTRAVENOUS

## 2015-09-19 MED ORDER — DIPHENHYDRAMINE HCL 50 MG/ML IJ SOLN: 12.5000 mg | Freq: Four times a day (QID) | INTRAMUSCULAR | Status: DC | PRN

## 2015-09-19 MED ORDER — ALBUTEROL SULFATE (2.5 MG/3ML) 0.083% IN NEBU: 3.0000 mL | INHALATION_SOLUTION | Freq: Four times a day (QID) | RESPIRATORY_TRACT | Status: DC | PRN

## 2015-09-19 MED ORDER — DEXAMETHASONE SODIUM PHOSPHATE 10 MG/ML IJ SOLN
INTRAMUSCULAR | Status: AC
  Filled 2015-09-19: qty 2

## 2015-09-19 MED ORDER — EPHEDRINE SULFATE 50 MG/ML IJ SOLN
INTRAMUSCULAR | Status: DC | PRN
  Administered 2015-09-19 (×3): 5 mg via INTRAVENOUS

## 2015-09-19 MED ORDER — PHENYLEPHRINE HCL 10 MG/ML IJ SOLN
INTRAMUSCULAR | Status: DC | PRN
  Administered 2015-09-19 (×3): 40 ug via INTRAVENOUS

## 2015-09-19 MED ORDER — SODIUM CHLORIDE 0.9% FLUSH: 3.0000 mL | Freq: Two times a day (BID) | INTRAVENOUS | Status: DC

## 2015-09-19 MED ORDER — LACTATED RINGERS IV SOLN
INTRAVENOUS | Status: DC | PRN
  Administered 2015-09-19 (×2): via INTRAVENOUS

## 2015-09-19 SURGICAL SUPPLY — 49 items
APPLIER CLIP 5 13 M/L LIGAMAX5 (MISCELLANEOUS) ×2
BENZOIN TINCTURE PRP APPL 2/3 (GAUZE/BANDAGES/DRESSINGS) ×2 IMPLANT
CANISTER SUCTION 2500CC (MISCELLANEOUS) ×2 IMPLANT
CATH REDDICK CHOLANGI 4FR 50CM (CATHETERS) ×2 IMPLANT
CHLORAPREP W/TINT 26ML (MISCELLANEOUS) ×2 IMPLANT
CLIP APPLIE 5 13 M/L LIGAMAX5 (MISCELLANEOUS) ×1 IMPLANT
COVER MAYO STAND STRL (DRAPES) ×2 IMPLANT
COVER SURGICAL LIGHT HANDLE (MISCELLANEOUS) ×2 IMPLANT
DECANTER SPIKE VIAL GLASS SM (MISCELLANEOUS) ×2 IMPLANT
DISSECTOR BLUNT TIP ENDO 5MM (MISCELLANEOUS) ×2 IMPLANT
DRAPE C-ARM 42X72 X-RAY (DRAPES) ×2 IMPLANT
DRSG TEGADERM 2-3/8X2-3/4 SM (GAUZE/BANDAGES/DRESSINGS) ×2 IMPLANT
ELECT REM PT RETURN 9FT ADLT (ELECTROSURGICAL) ×2
ELECTRODE REM PT RTRN 9FT ADLT (ELECTROSURGICAL) ×1 IMPLANT
ENDOLOOP SUT PDS II  0 18 (SUTURE) ×2
ENDOLOOP SUT PDS II 0 18 (SUTURE) ×2 IMPLANT
GAUZE SPONGE 2X2 8PLY STRL LF (GAUZE/BANDAGES/DRESSINGS) ×1 IMPLANT
GLOVE BIO SURGEON STRL SZ7.5 (GLOVE) ×2 IMPLANT
GLOVE BIOGEL PI IND STRL 7.0 (GLOVE) ×5 IMPLANT
GLOVE BIOGEL PI IND STRL 8 (GLOVE) ×2 IMPLANT
GLOVE BIOGEL PI INDICATOR 7.0 (GLOVE) ×5
GLOVE BIOGEL PI INDICATOR 8 (GLOVE) ×2
GLOVE ECLIPSE 7.0 STRL STRAW (GLOVE) ×4 IMPLANT
GLOVE ECLIPSE 8.0 STRL XLNG CF (GLOVE) ×2 IMPLANT
GLOVE SURG SS PI 7.5 STRL IVOR (GLOVE) ×2 IMPLANT
GOWN STRL REUS W/ TWL LRG LVL3 (GOWN DISPOSABLE) ×5 IMPLANT
GOWN STRL REUS W/TWL LRG LVL3 (GOWN DISPOSABLE) ×5
IV CATH 14GX2 1/4 (CATHETERS) ×2 IMPLANT
KIT BASIN OR (CUSTOM PROCEDURE TRAY) ×2 IMPLANT
KIT ROOM TURNOVER OR (KITS) ×2 IMPLANT
NS IRRIG 1000ML POUR BTL (IV SOLUTION) ×2 IMPLANT
PAD ARMBOARD 7.5X6 YLW CONV (MISCELLANEOUS) ×2 IMPLANT
POUCH RETRIEVAL ECOSAC 10 (ENDOMECHANICALS) ×1 IMPLANT
POUCH RETRIEVAL ECOSAC 10MM (ENDOMECHANICALS) ×1
POUCH SPECIMEN RETRIEVAL 10MM (ENDOMECHANICALS) ×2 IMPLANT
SCISSORS LAP 5X35 DISP (ENDOMECHANICALS) ×2 IMPLANT
SET IRRIG TUBING LAPAROSCOPIC (IRRIGATION / IRRIGATOR) ×2 IMPLANT
SLEEVE ENDOPATH XCEL 5M (ENDOMECHANICALS) ×4 IMPLANT
SPECIMEN JAR SMALL (MISCELLANEOUS) ×2 IMPLANT
SPONGE GAUZE 2X2 STER 10/PKG (GAUZE/BANDAGES/DRESSINGS) ×1
STRIP CLOSURE SKIN 1/2X4 (GAUZE/BANDAGES/DRESSINGS) ×2 IMPLANT
SUT MON AB 4-0 PC3 18 (SUTURE) ×2 IMPLANT
TOWEL OR 17X24 6PK STRL BLUE (TOWEL DISPOSABLE) ×2 IMPLANT
TOWEL OR 17X26 10 PK STRL BLUE (TOWEL DISPOSABLE) IMPLANT
TRAY LAPAROSCOPIC MC (CUSTOM PROCEDURE TRAY) ×2 IMPLANT
TROCAR XCEL BLUNT TIP 100MML (ENDOMECHANICALS) ×2 IMPLANT
TROCAR XCEL NON-BLD 11X100MML (ENDOMECHANICALS) IMPLANT
TROCAR XCEL NON-BLD 5MMX100MML (ENDOMECHANICALS) ×2 IMPLANT
TUBING INSUFFLATION (TUBING) ×2 IMPLANT

## 2015-09-19 NOTE — Anesthesia Preprocedure Evaluation (Signed)
Anesthesia Evaluation  Patient identified by MRN, date of birth, ID band Patient awake    Reviewed: Allergy & Precautions, NPO status , Patient's Chart, lab work & pertinent test results  Airway Mallampati: II  TM Distance: >3 FB Neck ROM: Full    Dental  (+) Edentulous Upper, Edentulous Lower   Pulmonary asthma , former smoker,    breath sounds clear to auscultation- rhonchi       Cardiovascular hypertension, Pt. on medications  Rhythm:Regular Rate:Normal     Neuro/Psych negative neurological ROS     GI/Hepatic Neg liver ROS, GERD  ,  Endo/Other  diabetes, Type 2, Oral Hypoglycemic Agents  Renal/GU negative Renal ROS     Musculoskeletal  (+) Arthritis ,   Abdominal   Peds  Hematology negative hematology ROS (+)   Anesthesia Other Findings   Reproductive/Obstetrics                             Lab Results  Component Value Date   WBC 7.6 09/16/2015   HGB 12.1 09/16/2015   HCT 38.4 09/16/2015   MCV 74.9* 09/16/2015   PLT 260.0 09/16/2015   Lab Results  Component Value Date   CREATININE 0.86 09/12/2015   BUN 14 09/12/2015   NA 143 09/12/2015   K 3.6 09/12/2015   CL 104 09/12/2015   CO2 29 09/12/2015    Anesthesia Physical Anesthesia Plan  ASA: II  Anesthesia Plan: General   Post-op Pain Management:    Induction: Intravenous  Airway Management Planned: Oral ETT  Additional Equipment:   Intra-op Plan:   Post-operative Plan: Extubation in OR  Informed Consent: I have reviewed the patients History and Physical, chart, labs and discussed the procedure including the risks, benefits and alternatives for the proposed anesthesia with the patient or authorized representative who has indicated his/her understanding and acceptance.   Dental advisory given  Plan Discussed with: CRNA  Anesthesia Plan Comments:         Anesthesia Quick Evaluation

## 2015-09-19 NOTE — Op Note (Signed)
OPERATIVE NOTE-LAPAROSCOPIC CHOLECYSTECTOMY  Preoperative diagnosis:  Symptomatic cholelithiasis  Postoperative diagnosis:  Same  Procedure: Laparoscopic cholecystectomy with cholangiogram.  Surgeon: Jackolyn Confer, M.D.  Asst.:  Autumn Messing, M.D.  Anesthesia: General  Indication:   This is a 65 year old female with symptomatic cholelithiasis who presents for elective cholecystectomy.  Preop LFTs are normal.  Technique: She was brought to the operating room, placed supine on the operating table, and a general anesthetic was administered.  The abdominal wall was then sterilely prepped and draped.  A timeout was performed.    Local anesthetic (Marcaine) was infiltrated in the subumbilical region. A small subumbilical incision was made through the skin, subcutaneous tissue, fascia, and peritoneum entering the peritoneal cavity under direct vision. A pursestring suture of 0 Vicryl was placed around the edges of the fascia. A Hassan trocar was introduced into the peritoneal cavity and a pneumoperitoneum was created by insufflation of carbon dioxide gas. The laparoscope was introduced into the trocar and no underlying bleeding or organ injury was noted. She was then placed in the reverse Trendelenburg position with the right side tilted slightly up.  Three 5 mm trocars were then placed into the abdominal cavity under laparoscopic vision. One in the epigastric area, and 2 in the right upper quadrant area. The gallbladder was visualized.  It was thickened and chronic inflammatory changes were note.  The fundus was grasped and retracted toward the right shoulder.  The infundibulum was mobilized with dissection close to the gallbladder and retracted laterally. The cystic duct was identified and appeared to be significantly dilated. A window was created around it. The cystic artery branches  were also identified. The critical view was achieved. There appeared to be a soft, nearly obstructing gallstone near  the cystic duct, common bile duct junction causing the dilation of the cystic duct.  An incision was made in the cystic duct near the gallbladder neck and multiple soft stone fragments were removed.   A small incision was made in the cystic duct. A cholangiocatheter was introduced through the anterior abdominal wall and placed in the cystic duct. A intraoperative cholangiogram was then performed.  Under real-time fluoroscopy, dilute contrast was injected into the cystic duct.  The common hepatic duct, the right and left hepatic ducts, and the common duct were all visualized.  The cystic duct was short.  Contrast drained into the duodenum without obvious evidence of any obstructing ductal lesion. The final report is pending the Radiologist's interpretation.  The cholangiocatheter was removed, the cystic duct divided close to the neck of the gallbladder and two PDS endoloops were then placed on the biliary side. No bile leak was noted from the cystic duct stump.  The cystic artery branches were isolated,  then clipped and divided. Following this the gallbladder was dissected free from the liver using electrocautery. The gallbladder was then placed in a retrieval bag and removed from the abdominal cavity through the subumbilical incision.  The gallbladder fossa was inspected, irrigated, and bleeding was controlled with electrocautery. Inspection showed that hemostasis was adequate and there was no evidence of bile leak.  The irrigation fluid was evacuated as much as possible.  The subumbilical trocar was removed and the fascial defect was closed by tightening and tying down the pursestring suture under laparoscopic vision.  The remaining trocars were removed and the pneumoperitoneum was released. The skin incisions were closed with 4-0 Monocryl subcuticular stitches. Steri-Strips and sterile dressings were applied.  The procedure was well-tolerated without any apparent  complications. She was taken to the  recovery room in satisfactory condition.

## 2015-09-19 NOTE — H&P (View-Only) (Signed)
Deborah Neal 08/22/2015 11:27 AM Location: [facility] Patient #: [id number] DOB: [birth year] Single / Language: Cleophus Molt / Race: Black or African American Female  History of Present Illness [name] MD; 08/22/2015 12:27 PM) The patient is a 65 year old female.   Note:She is referred by [name] for consultation due to upper abdominal pain and gallstones. Earlier this month, she went to work and had severe epigastric and right upper quadrant pain that radiated around to the back and was associated with nausea. The pain was so bad that she left work and went to the emergency room. While there, CT scan was performed which demonstrated cholelithiasis. It was felt this was the source of her pain. She improved with IV fluid hydration and pain medication and was discharged from the emergency department. A few days later, she saw [name] who then referred her here. She states she's not had any other episodes like that. She denies family history of gallbladder disease. She is unaware of any foods that may have caused this. While in the emergency room, her white blood cell count was normal and her liver function tests were not elevated.  Her past medical history is notable for diabetes mellitus (she does not routinely check her blood sugars) (, hypertension, hyperlipidemia, asthma  She works as a Secretary/administrator. Her 109 year old daughter lives with her.  Other Problems Elbert Ewings, CMA; 08/22/2015 11:27 AM) Asthma Diabetes Mellitus High blood pressure Hypercholesterolemia  Past Surgical History Elbert Ewings, CMA; 08/22/2015 11:27 AM) No pertinent past surgical history  Diagnostic Studies History Elbert Ewings, CMA; 08/22/2015 11:27 AM) Colonoscopy 1-5 years ago Mammogram within last year Pap Smear >5 years ago  Allergies Elbert Ewings, CMA; 08/22/2015 11:28 AM) No Known Drug Allergies 08/22/2015  Medication History Elbert Ewings, CMA; 08/22/2015 11:31  AM) MetFORMIN HCl ER (500MG  Tablet ER 24HR, Oral) Active. Pantoprazole Sodium (40MG  Tablet DR, Oral) Active. Rosuvastatin Calcium (20MG  Tablet, Oral) Active. Aspirin (81MG  Tablet, Oral) Active. Albuterol Sulfate HFA (108MCG/ACT Aerosol Soln, Inhalation) Active. Ferrous Fumarate (325 (106 Fe)MG Tablet, Oral) Active. Multiple Vitamin (Oral) Active. Protonix (40MG  Tablet DR, Oral) Active. Zofran (4MG  Tablet, Oral) Active. TraMADol HCl (50MG  Tablet, Oral) Active. Alendronate Sodium (10MG  Tablet, Oral) Active. Amlodipine Besy-Benazepril HCl (5-20MG  Capsule, Oral) Active. HydroCHLOROthiazide (12.5MG  Capsule, Oral) Active. Potassium (75MG  Tablet, Oral) Active. Flexeril (5MG  Tablet, Oral) Active. Medications Reconciled  Social History Elbert Ewings, [place]; 08/22/2015 11:27 AM) Caffeine use Coffee. No drug use  Family History Elbert Ewings, [place]; 08/22/2015 11:27 AM) Cancer Mother. Diabetes Mellitus Father. Heart Disease Father. Hypertension Father.  Pregnancy / Birth History Elbert Ewings, CMA; 08/22/2015 11:27 AM) Age at menarche 20 years. Age of menopause 9-50 Gravida 5 Irregular periods Maternal age <15 Para 4     Review of Systems Elbert Ewings CMA; 08/22/2015 11:27 AM) General Not Present- Appetite Loss, Chills, Fatigue, Fever, Night Sweats, Weight Gain and Weight Loss. Skin Not Present- Change in Wart/Mole, Dryness, Hives, Jaundice, New Lesions, Non-Healing Wounds, Rash and Ulcer. HEENT Present- Wears glasses/contact lenses. Not Present- Earache, Hearing Loss, Hoarseness, Nose Bleed, Oral Ulcers, Ringing in the Ears, Seasonal Allergies, Sinus Pain, Sore Throat, Visual Disturbances and Yellow Eyes. Respiratory Present- Wheezing. Not Present- Bloody sputum, Chronic Cough, Difficulty Breathing and Snoring. Breast Not Present- Breast Mass, Breast Pain, Nipple Discharge and Skin Changes. Cardiovascular Present- Shortness of Breath. Not Present- Chest Pain,  Difficulty Breathing Lying Down, Leg Cramps, Palpitations, Rapid Heart Rate and Swelling of Extremities. Gastrointestinal Not Present- Abdominal Pain, Bloating, Bloody Stool,  Change in Bowel Habits, Chronic diarrhea, Constipation, Difficulty Swallowing, Excessive gas, Gets full quickly at meals, Hemorrhoids, Indigestion, Nausea, Rectal Pain and Vomiting. Female Genitourinary Not Present- Frequency, Nocturia, Painful Urination, Pelvic Pain and Urgency. Musculoskeletal Not Present- Back Pain, Joint Pain, Joint Stiffness, Muscle Pain, Muscle Weakness and Swelling of Extremities. Neurological Not Present- Decreased Memory, Fainting, Headaches, Numbness, Seizures, Tingling, Tremor, Trouble walking and Weakness. Psychiatric Not Present- Anxiety, Bipolar, Change in Sleep Pattern, Depression, Fearful and Frequent crying. Endocrine Not Present- Cold Intolerance, Excessive Hunger, Hair Changes, Heat Intolerance, Hot flashes and New Diabetes. Hematology Not Present- Easy Bruising, Excessive bleeding, Gland problems, HIV and Persistent Infections.  Vitals Elbert Ewings CMA; 08/22/2015 11:32 AM) 08/22/2015 11:31 AM Weight: 180 lb Height: 64in Body Surface Area: 1.87 m Body Mass Index: 30.9 kg/m  Temp.: 98.67F  Pulse: 90 (Regular)  BP: 132/86 (Sitting, Left Arm, Standard)      Physical Exam [name] MD; 08/22/2015 12:26 PM)  The physical exam findings are as follows: Note:General: Overweight female in NAD. Pleasant and cooperative  EYES: EOMI, no icterus  CV: RRR, no murmur, no JVD.  CHEST: Breath sounds equal and clear. Respirations nonlabored.  ABDOMEN: Soft, mild RUQ and epigastric tenderness, no masses, no organomegaly, active bowel sounds, no scars, no hernias.  NEUROLOGIC: Alert and oriented, answers questions appropriately, normal gait and station.  PSYCHIATRIC: Normal mood, affect , and behavior.    Assessment & Plan [name] MD; 08/22/2015 12:27  PM)  SYMPTOMATIC CHOLELITHIASIS (K80.20) Impression: We discussed doing a laparoscopic possible open cholecystectomy for this and she is interested in scheduling the surgery.  Plan: Strict low fat diet. Laparoscopic cholecystectomy with cholangiogram. I have explained the procedure, risks, and aftercare of cholecystectomy. Risks include but are not limited to bleeding, infection, wound problems, anesthesia, diarrhea, bile leak, injury to common bile duct/liver/intestine. She seems to understand and agrees to proceed.  Jackolyn Confer, MD

## 2015-09-19 NOTE — Transfer of Care (Signed)
Immediate Anesthesia Transfer of Care Note  Patient: Deborah Neal  Procedure(s) Performed: Procedure(s): LAPAROSCOPIC CHOLECYSTECTOMY WITH INTRAOPERATIVE CHOLANGIOGRAM (N/A)  Patient Location: PACU  Anesthesia Type:General  Level of Consciousness: awake, alert  and oriented  Airway & Oxygen Therapy: Patient Spontanous Breathing and Patient connected to nasal cannula oxygen  Post-op Assessment: Report given to RN, Post -op Vital signs reviewed and stable and Patient moving all extremities X 4  Post vital signs: Reviewed and stable  Last Vitals:  Filed Vitals:   09/19/15 0756 09/19/15 1310  BP: 156/81   Pulse: 64   Temp: 37.1 C 36.6 C  Resp: 20     Last Pain: There were no vitals filed for this visit.       Complications: No apparent anesthesia complications

## 2015-09-19 NOTE — Discharge Instructions (Signed)
LAPAROSCOPIC SURGERY: POST OP INSTRUCTIONS  1. DIET: Follow a liquid diet 48 hours after arrival home, such as soup, liquids, crackers, etc.  Be sure to include lots of fluids daily.  Avoid fast food or heavy meals as your are more likely to get nauseated.  Eat a low fat solid diet beginning  after surgery.   2. Take your usually prescribed home medications unless otherwise directed. 3. PAIN CONTROL: a. Pain is best controlled by a usual combination of three different methods TOGETHER: i. Ice/Heat ii. Over the counter pain medication iii. Prescription pain medication b. Most patients will experience some swelling and bruising around the incisions.  Ice packs or heating pads (30-60 minutes up to 6 times a day) will help. Use ice for the first few days to help decrease swelling and bruising, then switch to heat to help relax tight/sore spots and speed recovery.  Some people prefer to use ice alone, heat alone, alternating between ice & heat.  Experiment to what works for you.  Swelling and bruising can take several weeks to resolve.   c. It is helpful to take an over-the-counter pain medication regularly for the first few weeks.  Choose one of the following that works best for you: i. Naproxen (Aleve, etc)  Two 220mg  tabs twice a day ii. Ibuprofen (Advil, etc) Three 200mg  tabs four times a day (every meal & bedtime) iii. Acetaminophen (Tylenol, etc) 500-650mg  four times a day (every meal & bedtime) d. A  prescription for pain medication (such as oxycodone, hydrocodone, etc) should be given to you upon discharge.  Take your pain medication as prescribed.  i. If you are having problems/concerns with the prescription medicine (does not control pain, nausea, vomiting, rash, itching, etc), please call us [phone] to see if we need to switch you to a different pain medicine that will work better for you and/or control your side effect better. ii. If you need a refill on your pain medication, please  contact your pharmacy.  They will contact our office to request authorization. Prescriptions will not be filled after 5 pm or on week-ends. 4. Avoid getting constipated.  Between the surgery and the pain medications, it is common to experience some constipation.  Increasing fluid intake and taking a fiber supplement (such as Metamucil, Citrucel, FiberCon, MiraLax, etc) 1-2 times a day regularly will usually help prevent this problem from occurring.  A mild laxative (prune juice, Milk of Magnesia, MiraLax, etc) should be taken according to package directions if there are no bowel movements after 48 hours.   5. Watch out for diarrhea.  If you have many loose bowel movements, simplify your diet to bland foods & liquids for a few days.  Stop any stool softeners and decrease your fiber supplement.  Switching to mild anti-diarrheal medications (Kayopectate, Pepto Bismol) can help.  If this worsens or does not improve, please call us. 6. Wash / shower every day.  You may shower over the dressings as they are waterproof.  Continue to shower over incision(s) after the dressing is off. 7. Remove your waterproof bandages 3 days after surgery.  You may leave the incision open to air.  You may replace a dressing/Band-Aid to cover the incision for comfort if you wish.  8. ACTIVITIES as tolerated:   a. You may resume regular (light) daily activities beginning the next day--such as daily self-care, walking, climbing stairs--gradually increasing light activities as tolerated.  No heavy lifting (over 10 pounds), straining, or intense activities for  2 weeks. b. DO NOT PUSH THROUGH PAIN.  Let pain be your guide: If it hurts to do something, don't do it.  Pain is your body warning you to avoid that activity for another week until the pain goes down. c. You may drive when you are no longer taking prescription pain medication, you can comfortably wear a seatbelt, and you can safely maneuver your car and apply brakes. d. Dennis Bast may  have sexual intercourse when it is comfortable.  9. FOLLOW UP in our office a. Please call [facility] at [phone] to set up an appointment to see your surgeon in the office for a follow-up appointment approximately 2-3 weeks after your surgery. b. Make sure that you call for this appointment the day you arrive home to insure a convenient appointment time. 10. IF YOU HAVE DISABILITY OR FAMILY LEAVE FORMS, BRING THEM TO THE OFFICE FOR PROCESSING.  DO NOT GIVE THEM TO YOUR DOCTOR.  11.  Return to work/school:  Desk work/light activities in 5-7 days, full duty/activities in 2 weeks if pain-free.   WHEN TO CALL us [phone]: 1. Poor pain control 2. Reactions / problems with new medications (rash/itching, nausea, etc)  3. Fever over 101.5 F (38.5 C) 4. Inability to urinate 5. Nausea and/or vomiting 6. Worsening swelling or bruising 7. Continued bleeding from incision. 8. Increased pain, redness, or drainage from the incision   The clinic staff is available to answer your questions during regular business hours (8:30am-5pm).  Please dont hesitate to call and ask to speak to one of our nurses for clinical concerns.   If you have a medical emergency, go to the nearest emergency room or call 911.  A surgeon from [facility] is always on call at the [facility], [place], [place], [place], [place]  [address] ? MAIN: [phone] ? TOLL FREE: [phone] ?  FAX [phone] [email or web address]

## 2015-09-19 NOTE — OR Nursing (Signed)
Patient with persisent nausea after zofran.  Pt is unable to keep liquids down.  Dr. Redmond Pulling notified via [place].  Awaiting return call.

## 2015-09-19 NOTE — Anesthesia Procedure Notes (Signed)
Procedure Name: Intubation Date/Time: 09/19/2015 11:22 AM Performed by: Suzy Bouchard Pre-anesthesia Checklist: Patient identified, Emergency Drugs available, Suction available, Timeout performed and Patient being monitored Patient Re-evaluated:Patient Re-evaluated prior to inductionOxygen Delivery Method: Circle system utilized Preoxygenation: Pre-oxygenation with 100% oxygen Intubation Type: IV induction Ventilation: Mask ventilation without difficulty Laryngoscope Size: Miller and 2 Grade View: Grade I Tube type: Oral Laser Tube: Cuffed inflated with minimal occlusive pressure - saline Tube size: 7.5 mm Number of attempts: 1 Airway Equipment and Method: Stylet and LTA kit utilized Placement Confirmation: ETT inserted through vocal cords under direct vision,  positive ETCO2 and breath sounds checked- equal and bilateral Tube secured with: Tape Dental Injury: Teeth and Oropharynx as per pre-operative assessment

## 2015-09-19 NOTE — Interval H&P Note (Signed)
History and Physical Interval Note:  09/19/2015 11:05 AM  Deborah Neal  has presented today for surgery, with the diagnosis of symptomatic cholelithiasis  The various methods of treatment have been discussed with the patient and family. After consideration of risks, benefits and other options for treatment, the patient has consented to  Procedure(s): LAPAROSCOPIC CHOLECYSTECTOMY WITH INTRAOPERATIVE CHOLANGIOGRAM (N/A) as a surgical intervention .  The patient's history has been reviewed, patient examined, no change in status, stable for surgery.  I have reviewed the patient's chart and labs.  Questions were answered to the patient's satisfaction.     [name]

## 2015-09-20 DIAGNOSIS — K8010 Calculus of gallbladder with chronic cholecystitis without obstruction: Secondary | ICD-10-CM | POA: Diagnosis not present

## 2015-09-20 NOTE — Progress Notes (Signed)
1 Day Post-Op  Subjective: Feels better this AM.  No more n/v.  Tolerating liquids.  Comfortable.  Objective: Vital signs in last 24 hours: Temp:  [97.8 F (36.6 C)-98.8 F (37.1 C)] 98 F (36.7 C) (05/27 0538) Pulse Rate:  [64-91] 72 (05/27 0538) Resp:  [7-29] 19 (05/27 0538) BP: (133-162)/(72-85) 137/79 mmHg (05/27 0538) SpO2:  [89 %-100 %] 96 % (05/27 0538) Weight:  [80.74 kg (178 lb)] 80.74 kg (178 lb) (05/26 0756) Last BM Date: 09/18/15  Intake/Output from previous day: 05/26 0701 - 05/27 0700 In: 2441.7 [P.O.:120; I.V.:2321.7] Out: 60 [Emesis/NG output:50; Blood:10] Intake/Output this shift:    PE: General- In NAD Abdomen-soft, dressings dry  Lab Results:  No results for input(s): WBC, HGB, HCT, PLT in the last 72 hours. BMET No results for input(s): NA, K, CL, CO2, GLUCOSE, BUN, CREATININE, CALCIUM in the last 72 hours. PT/INR No results for input(s): LABPROT, INR in the last 72 hours. Comprehensive Metabolic Panel:    Component Value Date/Time   NA 143 09/12/2015 1130   NA 140 08/04/2015 2032   K 3.6 09/12/2015 1130   K 4.0 08/04/2015 2032   CL 104 09/12/2015 1130   CL 108 08/04/2015 2032   CO2 29 09/12/2015 1130   CO2 24 08/04/2015 2032   BUN 14 09/12/2015 1130   BUN 22* 08/04/2015 2032   CREATININE 0.86 09/12/2015 1130   CREATININE 1.43* 08/04/2015 2032   GLUCOSE 129* 09/12/2015 1130   GLUCOSE 111* 08/04/2015 2032   CALCIUM 9.7 09/12/2015 1130   CALCIUM 9.0 08/04/2015 2032   AST 23 09/16/2015 1037   AST 30 08/04/2015 2032   ALT 23 09/16/2015 1037   ALT 31 08/04/2015 2032   ALKPHOS 74 09/16/2015 1037   ALKPHOS 70 08/04/2015 2032   BILITOT 0.3 09/16/2015 1037   BILITOT 0.5 08/04/2015 2032   PROT 7.6 09/16/2015 1037   PROT 7.6 08/04/2015 2032   ALBUMIN 4.6 09/16/2015 1037   ALBUMIN 4.1 08/04/2015 2032     Studies/Results: Dg Cholangiogram Operative  09/19/2015  CLINICAL DATA:  Lap Chole With IOC for symptomatic cholelithiasis. RSTO  performed by Hampton Abbot RT-R. Fluoro Time is 28 secs. EXAM: INTRAOPERATIVE CHOLANGIOGRAM TECHNIQUE: Cholangiographic images from the C-arm fluoroscopic device were submitted for interpretation post-operatively. Please see the procedural report for the amount of contrast and the fluoroscopy time utilized. COMPARISON:  CT 08/04/2015 FINDINGS: No persistent filling defects in the common duct. Intrahepatic ducts are incompletely visualized, appearing decompressed centrally. Contrast passes into the duodenum. : Negative for retained common duct stone. Electronically Signed   By: Lucrezia Europe M.D.   On: 09/19/2015 12:42    Anti-infectives: Anti-infectives    Start     Dose/Rate Route Frequency Ordered Stop   09/19/15 1030  ceFAZolin (ANCEF) IVPB 2g/100 mL premix     2 g 200 mL/hr over 30 Minutes Intravenous To [facility] 09/18/15 0944 09/19/15 1115      Assessment Active Problems:   S/P laparoscopic cholecystectomy-feeling better and keeping liquids down without n/v      Plan: Discharge. Instructions given to her.   Gunter Conde Lenna Sciara 09/20/2015

## 2015-09-20 NOTE — Discharge Planning (Addendum)
AVS to patient who verbalizes understanding. Will dc to private car home with all personal belongings accompanied by family. dc'd at [place].

## 2015-09-20 NOTE — Discharge Summary (Signed)
Physician Discharge Summary  Patient ID: Deborah Neal MRN: [id number] DOB/AGE: Oct 22, 1950 65 y.o.  Admit date: 09/19/2015 Discharge date: 09/20/2015  Admission Diagnoses:  Symptomatic cholelithiasis  Discharge Diagnoses:  Active Problems:   Symptomatic cholelithiasis S/P laparoscopic cholecystectomy   Discharged Condition: good  Hospital Course: She underwent the above procedure and had postop n/v.  This improved overnight and she was able to be discharged on POD#1.  Discharge instructions were given to her.  Discharge Exam: Blood pressure 137/79, pulse 72, temperature 98 F (36.7 C), temperature source Oral, resp. rate 19, height 5' 3.5" (1.613 m), weight 80.74 kg (178 lb), SpO2 96 %.   Disposition: 01-Home or Self Care     Medication List    STOP taking these medications        ondansetron 4 MG disintegrating tablet  Commonly known as:  ZOFRAN ODT      TAKE these medications        albuterol 108 (90 Base) MCG/ACT inhaler  Commonly known as:  PROVENTIL HFA;VENTOLIN HFA  Inhale 2 puffs into the lungs every 6 (six) hours as needed for wheezing or shortness of breath.     alendronate 70 MG tablet  Commonly known as:  FOSAMAX  TAKE ONE TABLET BY MOUTH ONCE EVERY 7 DAYS. TAKE  WITH  A  FULL  GLASS  OF  WATER  AND  ON  AN  EMPTY  STOMACH     amLODipine-benazepril 5-20 MG capsule  Commonly known as:  LOTREL  TAKE TWO CAPSULES BY MOUTH ONCE DAILY.     aspirin 81 MG tablet  Take 81 mg by mouth every morning.     cyclobenzaprine 5 MG tablet  Commonly known as:  FLEXERIL  1-2 qhs prn     ferrous sulfate 325 (65 FE) MG tablet  Take 325 mg by mouth daily with breakfast.     hydrochlorothiazide 25 MG tablet  Commonly known as:  HYDRODIURIL  TAKE ONE TABLET BY MOUTH ONCE DAILY     metFORMIN 500 MG 24 hr tablet  Commonly known as:  GLUCOPHAGE-XR  2 tabs by mouth in the am, and 1 in the PM     multivitamin with minerals Tabs tablet  Take 1 tablet by mouth daily.      ondansetron 4 MG tablet  Commonly known as:  ZOFRAN  Take 1 tablet (4 mg total) by mouth every 4 (four) hours as needed for nausea or vomiting.     oxyCODONE 5 MG immediate release tablet  Commonly known as:  Oxy IR/ROXICODONE  Take 1-2 tablets (5-10 mg total) by mouth every 4 (four) hours as needed for moderate pain, severe pain or breakthrough pain.     pantoprazole 40 MG tablet  Commonly known as:  PROTONIX  Take 1 tablet (40 mg total) by mouth daily.     potassium chloride 10 MEQ tablet  Commonly known as:  K-DUR  TAKE TWO TABLETS BY MOUTH ONCE DAILY     rosuvastatin 20 MG tablet  Commonly known as:  CRESTOR  Take 1 tablet (20 mg total) by mouth daily.     traMADol 50 MG tablet  Commonly known as:  ULTRAM  Take 1 tablet (50 mg total) by mouth every 6 (six) hours as needed.         Signed: [name] 09/20/2015, 7:42 AM

## 2015-09-22 ENCOUNTER — Encounter (HOSPITAL_COMMUNITY): Payer: Self-pay | Admitting: General Surgery

## 2015-09-22 NOTE — Assessment & Plan Note (Signed)
stable overall by history and exam, recent data reviewed with pt, and pt to continue medical treatment as before,  to f/u any worsening symptoms or concerns Lab Results  Component Value Date   LDLCALC 71 09/16/2015

## 2015-09-22 NOTE — Assessment & Plan Note (Signed)
stable overall by history and exam, recent data reviewed with pt, and pt to continue medical treatment as before,  to f/u any worsening symptoms or concerns BP Readings from Last 3 Encounters:  09/20/15 137/79  09/16/15 140/80  09/12/15 157/81

## 2015-09-22 NOTE — Assessment & Plan Note (Signed)
Mild new onset, no overt bleeding not on anticoag, for iron/b12,  to f/u any worsening symptoms or concerns

## 2015-09-22 NOTE — Assessment & Plan Note (Addendum)
Mild uncontrolled, to increased metformin as documented, o/w stable overall by history and exam, recent data reviewed with pt, and pt to continue medical treatment as before,  to f/u any worsening symptoms or concerns Lab Results  Component Value Date   HGBA1C 7.9* 09/12/2015   In addition to the time spent performing CPE, I spent an additional 40 minutes face to face,in which greater than 50% of this time was spent in counseling and coordination of care for patient's acute illness as documented.

## 2015-09-22 NOTE — Assessment & Plan Note (Signed)

## 2015-09-24 NOTE — Anesthesia Postprocedure Evaluation (Signed)
Anesthesia Post Note  Patient: Deborah Neal  Procedure(s) Performed: Procedure(s) (LRB): LAPAROSCOPIC CHOLECYSTECTOMY WITH INTRAOPERATIVE CHOLANGIOGRAM (N/A)  Patient location during evaluation: PACU Anesthesia Type: General Level of consciousness: awake and alert Pain management: pain level controlled Vital Signs Assessment: post-procedure vital signs reviewed and stable Respiratory status: spontaneous breathing, nonlabored ventilation, respiratory function stable and patient connected to nasal cannula oxygen Cardiovascular status: blood pressure returned to baseline and stable Postop Assessment: no signs of nausea or vomiting Anesthetic complications: no    Last Vitals:  Filed Vitals:   09/19/15 2117 09/20/15 0538  BP: 137/72 137/79  Pulse: 83 72  Temp: 37.1 C 36.7 C  Resp: 19 19    Last Pain:  Filed Vitals:   09/20/15 0538  PainSc: 2                  Tiajuana Amass

## 2015-11-14 ENCOUNTER — Other Ambulatory Visit: Payer: Self-pay | Admitting: Internal Medicine

## 2016-01-15 ENCOUNTER — Other Ambulatory Visit: Payer: Self-pay | Admitting: Internal Medicine

## 2016-01-15 DIAGNOSIS — Z1231 Encounter for screening mammogram for malignant neoplasm of breast: Secondary | ICD-10-CM

## 2016-02-13 ENCOUNTER — Ambulatory Visit
Admission: RE | Admit: 2016-02-13 | Discharge: 2016-02-13 | Disposition: A | Discharge status: Home / Self Care | Payer: BC Managed Care – PPO | Source: Ambulatory Visit | Attending: Internal Medicine | Admitting: Internal Medicine

## 2016-02-13 DIAGNOSIS — Z1231 Encounter for screening mammogram for malignant neoplasm of breast: Secondary | ICD-10-CM

## 2016-03-23 ENCOUNTER — Other Ambulatory Visit (INDEPENDENT_AMBULATORY_CARE_PROVIDER_SITE_OTHER): Payer: BC Managed Care – PPO

## 2016-03-23 ENCOUNTER — Encounter: Payer: Self-pay | Admitting: Internal Medicine

## 2016-03-23 ENCOUNTER — Ambulatory Visit (INDEPENDENT_AMBULATORY_CARE_PROVIDER_SITE_OTHER): Payer: BC Managed Care – PPO | Admitting: Internal Medicine

## 2016-03-23 VITALS — BP 124/80 | HR 85 | Temp 98.3°F | Resp 20 | Wt 181.0 lb

## 2016-03-23 DIAGNOSIS — I10 Essential (primary) hypertension: Secondary | ICD-10-CM

## 2016-03-23 DIAGNOSIS — E119 Type 2 diabetes mellitus without complications: Secondary | ICD-10-CM

## 2016-03-23 DIAGNOSIS — Z0001 Encounter for general adult medical examination with abnormal findings: Secondary | ICD-10-CM | POA: Diagnosis not present

## 2016-03-23 DIAGNOSIS — E785 Hyperlipidemia, unspecified: Secondary | ICD-10-CM | POA: Diagnosis not present

## 2016-03-23 LAB — LIPID PANEL
Cholesterol: 145 mg/dL (ref 0–200)
HDL: 50.6 mg/dL (ref >39.00)
LDL Cholesterol: 74 mg/dL (ref 0–99)
NONHDL: 94.31
Total CHOL/HDL Ratio: 3
Triglycerides: 100 mg/dL (ref 0.0–149.0)
VLDL: 20 mg/dL (ref 0.0–40.0)

## 2016-03-23 LAB — HEPATIC FUNCTION PANEL
ALBUMIN: 4.5 g/dL (ref 3.5–5.2)
ALT: 26 U/L (ref 0–35)
AST: 22 U/L (ref 0–37)
Alkaline Phosphatase: 75 U/L (ref 39–117)
BILIRUBIN DIRECT: 0.1 mg/dL (ref 0.0–0.3)
TOTAL PROTEIN: 7.6 g/dL (ref 6.0–8.3)
Total Bilirubin: 0.4 mg/dL (ref 0.2–1.2)

## 2016-03-23 LAB — BASIC METABOLIC PANEL
BUN: 25 mg/dL — ABNORMAL HIGH (ref 6–23)
CALCIUM: 9.8 mg/dL (ref 8.4–10.5)
CO2: 30 meq/L (ref 19–32)
CREATININE: 0.96 mg/dL (ref 0.40–1.20)
Chloride: 103 mEq/L (ref 96–112)
GFR: 75.07 mL/min (ref >60.00)
Glucose, Bld: 110 mg/dL — ABNORMAL HIGH (ref 70–99)
Potassium: 3.2 mEq/L — ABNORMAL LOW (ref 3.5–5.1)
Sodium: 141 mEq/L (ref 135–145)

## 2016-03-23 LAB — HEMOGLOBIN A1C: HEMOGLOBIN A1C: 7.7 % — AB (ref 4.6–6.5)

## 2016-03-23 NOTE — Patient Instructions (Addendum)
You had the flu shot today  Please continue all other medications as before, and refills have been done if requested.  Please have the pharmacy call with any other refills you may need.  Please continue your efforts at being more active, low cholesterol diet, and weight control..  Please keep your appointments with your specialists as you may have planned  Please go to the LAB in the Basement (turn left off the elevator) for the tests to be done today  You will be contacted by phone if any changes need to be made immediately.  Otherwise, you will receive a letter about your results with an explanation, but please check with MyChart first.  Please remember to sign up for MyChart if you have not done so, as this will be important to you in the future with finding out test results, communicating by private email, and scheduling acute appointments online when needed.  Please return in 6 months, or sooner if needed, with Lab testing done 3-5 days before   

## 2016-03-23 NOTE — Progress Notes (Signed)
Pre visit review using our clinic review tool, if applicable. No additional management support is needed unless otherwise documented below in the visit note. 

## 2016-03-23 NOTE — Progress Notes (Signed)
Subjective:    Patient ID: Deborah Neal, female    DOB: [birth year], 65 y.o.   MRN: [id number]  HPI  Here to f/u; overall doing ok,  Pt denies chest pain, increasing sob or doe, wheezing, orthopnea, PND, increased LE swelling, palpitations, dizziness or syncope.  Pt denies new neurological symptoms such as new headache, or facial or extremity weakness or numbness.  Pt denies polydipsia, polyuria, or low sugar episode.   Pt denies new neurological symptoms such as new headache, or facial or extremity weakness or numbness.   Pt states overall good compliance with meds, mostly trying to follow appropriate diet, with wt overall stable,  Wt Readings from Last 3 Encounters:  03/23/16 181 lb (82.1 kg)  09/19/15 178 lb (80.7 kg)  09/16/15 180 lb (81.6 kg)   Past Medical History:  Diagnosis Date  . ALLERGIC RHINITIS 01/29/2007  . Arthritis   . ASTHMA 01/29/2007  . ASTHMA, WITH ACUTE EXACERBATION 06/24/2008  . BACK PAIN 01/29/2009  . CHEST PAIN 06/24/2008  . Family history of adverse reaction to anesthesia    Patients daughter has N/V after anesthesia  . GERD (gastroesophageal reflux disease)   . History of shingles   . HYPERLIPIDEMIA 01/29/2007  . HYPERTENSION 01/29/2007  . OSTEOPENIA 10/16/2007  . Type II or unspecified type diabetes mellitus without mention of complication, uncontrolled 11/29/2013   Past Surgical History:  Procedure Laterality Date  . CHOLECYSTECTOMY N/A 09/19/2015   Procedure: LAPAROSCOPIC CHOLECYSTECTOMY WITH INTRAOPERATIVE CHOLANGIOGRAM;  Surgeon: Jackolyn Confer, MD;  Location: [facility];  Service: General;  Laterality: N/A;  . COLONOSCOPY    . TONSILLECTOMY    . VIDEO BRONCHOSCOPY Bilateral 03/28/2014   Procedure: VIDEO BRONCHOSCOPY WITHOUT FLUORO;  Surgeon: Tanda Rockers, MD;  Location: [facility];  Service: Cardiopulmonary;  Laterality: Bilateral;    reports that she quit smoking about 12 years ago. She has never used smokeless tobacco. She reports that she does not drink  alcohol or use drugs. family history includes Asthma in her daughter and father; Cancer in her mother; Diabetes in her father; Heart disease in her father; Hypertension in her father. No Known Allergies Current Outpatient Prescriptions on File Prior to Visit  Medication Sig Dispense Refill  . albuterol (PROVENTIL HFA;VENTOLIN HFA) 108 (90 Base) MCG/ACT inhaler Inhale 2 puffs into the lungs every 6 (six) hours as needed for wheezing or shortness of breath.    Marland Kitchen alendronate (FOSAMAX) 70 MG tablet TAKE ONE TABLET BY MOUTH ONCE EVERY 7 DAYS. TAKE  WITH  A  FULL  GLASS  OF  WATER  AND  ON  AN  EMPTY  STOMACH 12 tablet 1  . amLODipine-benazepril (LOTREL) 5-20 MG capsule TAKE TWO CAPSULES BY MOUTH ONCE DAILY. 180 capsule 1  . aspirin 81 MG tablet Take 81 mg by mouth every morning.     . cyclobenzaprine (FLEXERIL) 5 MG tablet 1-2 qhs prn (Patient taking differently: Take 5-10 mg by mouth at bedtime as needed for muscle spasms. ) 14 tablet 0  . ferrous sulfate 325 (65 FE) MG tablet Take 325 mg by mouth daily with breakfast.    . hydrochlorothiazide (HYDRODIURIL) 25 MG tablet TAKE ONE TABLET BY MOUTH ONCE DAILY 90 tablet 1  . metFORMIN (GLUCOPHAGE-XR) 500 MG 24 hr tablet 2 tabs by mouth in the am, and 1 in the PM 270 tablet 3  . metFORMIN (GLUCOPHAGE-XR) 500 MG 24 hr tablet TAKE ONE TABLET BY MOUTH ONCE DAILY WITH BREAKFAST 90 tablet 0  .  Multiple Vitamin (MULTIVITAMIN WITH MINERALS) TABS tablet Take 1 tablet by mouth daily.    . ondansetron (ZOFRAN) 4 MG tablet Take 1 tablet (4 mg total) by mouth every 4 (four) hours as needed for nausea or vomiting. 20 tablet 0  . oxyCODONE (OXY IR/ROXICODONE) 5 MG immediate release tablet Take 1-2 tablets (5-10 mg total) by mouth every 4 (four) hours as needed for moderate pain, severe pain or breakthrough pain. 40 tablet 0  . pantoprazole (PROTONIX) 40 MG tablet Take 1 tablet (40 mg total) by mouth daily. 90 tablet 3  . rosuvastatin (CRESTOR) 20 MG tablet Take 1 tablet  (20 mg total) by mouth daily. 90 tablet 3  . traMADol (ULTRAM) 50 MG tablet Take 1 tablet (50 mg total) by mouth every 6 (six) hours as needed. 15 tablet 0   No current facility-administered medications on file prior to visit.    Review of Systems  Constitutional: Negative for unusual diaphoresis or night sweats HENT: Negative for ear swelling or discharge Eyes: Negative for worsening visual haziness  Respiratory: Negative for choking and stridor.   Gastrointestinal: Negative for distension or worsening eructation Genitourinary: Negative for retention or change in urine volume.  Musculoskeletal: Negative for other MSK pain or swelling Skin: Negative for color change and worsening wound Neurological: Negative for tremors and numbness other than noted  Psychiatric/Behavioral: Negative for decreased concentration or agitation other than above   \All other system neg per pt    Objective:   Physical Exam BP 124/80   Pulse 85   Temp 98.3 F (36.8 C) (Oral)   Resp 20   Wt 181 lb (82.1 kg)   SpO2 91%   BMI 31.56 kg/m  VS noted,  Constitutional: Pt appears in no apparent distress HENT: Head: NCAT.  Right Ear: External ear normal.  Left Ear: External ear normal.  Eyes: . Pupils are equal, round, and reactive to light. Conjunctivae and EOM are normal Neck: Normal range of motion. Neck supple.  Cardiovascular: Normal rate and regular rhythm.   Pulmonary/Chest: Effort normal and breath sounds without rales or wheezing.  Neurological: Pt is alert. Not confused , motor grossly intact Skin: Skin is warm. No rash, no LE edema Psychiatric: Pt behavior is normal. No agitation.  No other new exam findings    Assessment & Plan:

## 2016-03-26 ENCOUNTER — Other Ambulatory Visit: Payer: Self-pay | Admitting: Internal Medicine

## 2016-03-26 ENCOUNTER — Encounter: Payer: Self-pay | Admitting: Internal Medicine

## 2016-03-26 MED ORDER — POTASSIUM CHLORIDE ER 10 MEQ PO TBCR: 30.0000 meq | EXTENDED_RELEASE_TABLET | Freq: Every day | ORAL | 3 refills | Status: DC

## 2016-03-29 NOTE — Assessment & Plan Note (Signed)
stable overall by history and exam, recent data reviewed with pt, and pt to continue medical treatment as before,  to f/u any worsening symptoms or concerns Lab Results  Component Value Date   LDLCALC 74 03/23/2016

## 2016-03-29 NOTE — Assessment & Plan Note (Signed)
stable overall by history and exam, recent data reviewed with pt, and pt to continue medical treatment as before,  to f/u any worsening symptoms or concerns Lab Results  Component Value Date   HGBA1C 7.7 (H) 03/23/2016

## 2016-03-29 NOTE — Assessment & Plan Note (Signed)
stable overall by history and exam, recent data reviewed with pt, and pt to continue medical treatment as before,  to f/u any worsening symptoms or concerns BP Readings from Last 3 Encounters:  03/23/16 124/80  09/20/15 137/79  09/16/15 140/80

## 2016-05-22 ENCOUNTER — Encounter: Payer: Self-pay | Admitting: Physician Assistant

## 2016-05-22 ENCOUNTER — Ambulatory Visit (INDEPENDENT_AMBULATORY_CARE_PROVIDER_SITE_OTHER): Payer: BC Managed Care – PPO | Admitting: Physician Assistant

## 2016-05-22 VITALS — BP 134/82 | HR 72 | Temp 98.2°F | Wt 182.5 lb

## 2016-05-22 DIAGNOSIS — B9789 Other viral agents as the cause of diseases classified elsewhere: Secondary | ICD-10-CM

## 2016-05-22 DIAGNOSIS — J069 Acute upper respiratory infection, unspecified: Secondary | ICD-10-CM

## 2016-05-22 MED ORDER — ALBUTEROL SULFATE HFA 108 (90 BASE) MCG/ACT IN AERS: 2.0000 | INHALATION_SPRAY | Freq: Four times a day (QID) | RESPIRATORY_TRACT | 0 refills | Status: DC | PRN

## 2016-05-22 MED ORDER — BENZONATATE 100 MG PO CAPS: 100.0000 mg | ORAL_CAPSULE | Freq: Two times a day (BID) | ORAL | 0 refills | Status: DC | PRN

## 2016-05-22 NOTE — Progress Notes (Signed)
Patient presents to clinic today c/o 2 days of a dry cough with chills starting gradually. Denies fever but endorses chills. Notes some PND and ear pressure with scratchy throat. Denies sinus pain, ear pain or tooth pain. Denies aches. Denies sick contacts or recent travel. Has taken cough drops but nothing else OTC. Has history of asthma, noting mild chest tightness without wheeze or SOB. Is out of her albuterol inhaler.   Past Medical History:  Diagnosis Date  . ALLERGIC RHINITIS 01/29/2007  . Arthritis   . ASTHMA 01/29/2007  . ASTHMA, WITH ACUTE EXACERBATION 06/24/2008  . BACK PAIN 01/29/2009  . CHEST PAIN 06/24/2008  . Family history of adverse reaction to anesthesia    Patients daughter has N/V after anesthesia  . GERD (gastroesophageal reflux disease)   . History of shingles   . HYPERLIPIDEMIA 01/29/2007  . HYPERTENSION 01/29/2007  . OSTEOPENIA 10/16/2007  . Type II or unspecified type diabetes mellitus without mention of complication, uncontrolled 11/29/2013    Current Outpatient Prescriptions on File Prior to Visit  Medication Sig Dispense Refill  . albuterol (PROVENTIL HFA;VENTOLIN HFA) 108 (90 Base) MCG/ACT inhaler Inhale 2 puffs into the lungs every 6 (six) hours as needed for wheezing or shortness of breath.    Marland Kitchen alendronate (FOSAMAX) 70 MG tablet TAKE ONE TABLET BY MOUTH ONCE EVERY 7 DAYS. TAKE  WITH  A  FULL  GLASS  OF  WATER  AND  ON  AN  EMPTY  STOMACH 12 tablet 1  . amLODipine-benazepril (LOTREL) 5-20 MG capsule TAKE TWO CAPSULES BY MOUTH ONCE DAILY. 180 capsule 1  . aspirin 81 MG tablet Take 81 mg by mouth every morning.     . cyclobenzaprine (FLEXERIL) 5 MG tablet 1-2 qhs prn (Patient taking differently: Take 5-10 mg by mouth at bedtime as needed for muscle spasms. ) 14 tablet 0  . ferrous sulfate 325 (65 FE) MG tablet Take 325 mg by mouth daily with breakfast.    . hydrochlorothiazide (HYDRODIURIL) 25 MG tablet TAKE ONE TABLET BY MOUTH ONCE DAILY 90 tablet 1  . metFORMIN  (GLUCOPHAGE-XR) 500 MG 24 hr tablet 2 tabs by mouth in the am, and 1 in the PM 270 tablet 3  . metFORMIN (GLUCOPHAGE-XR) 500 MG 24 hr tablet TAKE ONE TABLET BY MOUTH ONCE DAILY WITH BREAKFAST 90 tablet 0  . Multiple Vitamin (MULTIVITAMIN WITH MINERALS) TABS tablet Take 1 tablet by mouth daily.    . ondansetron (ZOFRAN) 4 MG tablet Take 1 tablet (4 mg total) by mouth every 4 (four) hours as needed for nausea or vomiting. 20 tablet 0  . oxyCODONE (OXY IR/ROXICODONE) 5 MG immediate release tablet Take 1-2 tablets (5-10 mg total) by mouth every 4 (four) hours as needed for moderate pain, severe pain or breakthrough pain. 40 tablet 0  . pantoprazole (PROTONIX) 40 MG tablet Take 1 tablet (40 mg total) by mouth daily. 90 tablet 3  . potassium chloride (K-DUR) 10 MEQ tablet Take 3 tablets (30 mEq total) by mouth daily. 270 tablet 3  . rosuvastatin (CRESTOR) 20 MG tablet Take 1 tablet (20 mg total) by mouth daily. 90 tablet 3  . traMADol (ULTRAM) 50 MG tablet Take 1 tablet (50 mg total) by mouth every 6 (six) hours as needed. 15 tablet 0   No current facility-administered medications on file prior to visit.     No Known Allergies  Family History  Problem Relation Age of Onset  . Cancer Mother     ?  type   . Diabetes Father   . Hypertension Father   . Heart disease Father   . Asthma Father   . Asthma Daughter     Social History   Social History  . Marital status: Single    Spouse name: N/A  . Number of children: N/A  . Years of education: N/A   Social History Main Topics  . Smoking status: Former Smoker    Quit date: 09/07/2003  . Smokeless tobacco: Never Used  . Alcohol use No  . Drug use: No  . Sexual activity: No   Other Topics Concern  . None   Social History Narrative  . None    Review of Systems - See HPI.  All other ROS are negative.  Wt 182 lb 8 oz (82.8 kg)   BMI 31.82 kg/m   Physical Exam  Constitutional: She is oriented to person, place, and time and  well-developed, well-nourished, and in no distress.  HENT:  Head: Normocephalic and atraumatic.  Right Ear: External ear normal.  Left Ear: External ear normal.  Nose: Nose normal.  Mouth/Throat: Oropharynx is clear and moist. No oropharyngeal exudate.  TM within normal limits bilaterally  Eyes: Conjunctivae are normal.  Cardiovascular: Normal rate, regular rhythm, normal heart sounds and intact distal pulses.   Pulmonary/Chest: Effort normal. No respiratory distress. She has wheezes. She has no rales. She exhibits no tenderness.  Lymphadenopathy:    She has no cervical adenopathy.  Neurological: She is alert and oriented to person, place, and time.  Skin: Skin is warm and dry. No rash noted.  Psychiatric: Affect normal.  Vitals reviewed.  Recent Results (from the past 2160 hour(s))  Hemoglobin A1c     Status: Abnormal   Collection Time: 03/23/16 10:39 AM  Result Value Ref Range   Hgb A1c MFr Bld 7.7 (H) 4.6 - 6.5 %    Comment: Glycemic Control Guidelines for People with Diabetes:Non Diabetic:  <6%Goal of Therapy: <7%Additional Action Suggested:  >8%   Lipid panel     Status: None   Collection Time: 03/23/16 10:39 AM  Result Value Ref Range   Cholesterol 145 0 - 200 mg/dL    Comment: ATP III Classification       Desirable:  < 200 mg/dL               Borderline High:  200 - 239 mg/dL          High:  > = 240 mg/dL   Triglycerides 100.0 0.0 - 149.0 mg/dL    Comment: Normal:  <150 mg/dLBorderline High:  150 - 199 mg/dL   HDL 50.60 >39.00 mg/dL   VLDL 20.0 0.0 - 40.0 mg/dL   LDL Cholesterol 74 0 - 99 mg/dL   Total CHOL/HDL Ratio 3     Comment:                Men          Women1/2 Average Risk     3.4          3.3Average Risk          5.0          4.42X Average Risk          9.6          7.13X Average Risk          15.0          11.0  NonHDL 94.31     Comment: NOTE:  Non-HDL goal should be 30 mg/dL higher than patient's LDL goal (i.e. LDL goal of < 70 mg/dL, would  have non-HDL goal of < 100 mg/dL)  Basic metabolic panel     Status: Abnormal   Collection Time: 03/23/16 10:39 AM  Result Value Ref Range   Sodium 141 135 - 145 mEq/L   Potassium 3.2 (L) 3.5 - 5.1 mEq/L   Chloride 103 96 - 112 mEq/L   CO2 30 19 - 32 mEq/L   Glucose, Bld 110 (H) 70 - 99 mg/dL   BUN 25 (H) 6 - 23 mg/dL   Creatinine, Ser 0.96 0.40 - 1.20 mg/dL   Calcium 9.8 8.4 - 10.5 mg/dL   GFR 75.07 >60.00 mL/min  Hepatic function panel     Status: None   Collection Time: 03/23/16 10:39 AM  Result Value Ref Range   Total Bilirubin 0.4 0.2 - 1.2 mg/dL   Bilirubin, Direct 0.1 0.0 - 0.3 mg/dL   Alkaline Phosphatase 75 39 - 117 U/L   AST 22 0 - 37 U/L   ALT 26 0 - 35 U/L   Total Protein 7.6 6.0 - 8.3 g/dL   Albumin 4.5 3.5 - 5.2 g/dL   Assessment/Plan: 1. Viral URI with cough Lungs with slight wheeze noted in lung bases. No rales. Examination otherwise unremarkable. Patient afebrile. No indication of flu-like symptoms. Rx Tessalon for cough. Supportive measures and OTC medications reviewed. Albuterol MDI refilled. Patient to use as directed. Even though exam good today, giving her history of asthma, strict return precautions were given. Patient voices understanding and agreement with the plan.   - albuterol (PROVENTIL HFA;VENTOLIN HFA) 108 (90 Base) MCG/ACT inhaler; Inhale 2 puffs into the lungs every 6 (six) hours as needed for wheezing or shortness of breath.  Dispense: 6.7 g; Refill: 0 - benzonatate (TESSALON) 100 MG capsule; Take 1 capsule (100 mg total) by mouth 2 (two) times daily as needed for cough.  Dispense: 20 capsule; Refill: 0   Leeanne Rio, [place]

## 2016-05-22 NOTE — Patient Instructions (Signed)
Please stay well-hydrated and get plenty of rest. Place a humidifier in the bedroom.  Take the cough medication given as directed. I have refilled you albuterol inhaler. Use as directed for wheeze and chest tightness.  Plain Mucinex to help thin congestion.  Follow-up if symptoms or not improving. Return immediately if you note any worsening chest tightness or wheeze.

## 2016-06-01 ENCOUNTER — Other Ambulatory Visit: Payer: Self-pay | Admitting: Internal Medicine

## 2016-07-06 ENCOUNTER — Inpatient Hospital Stay (HOSPITAL_COMMUNITY)
Admission: EM | Admit: 2016-07-06 | Discharge: 2016-07-09 | DRG: 638 | Disposition: A | Discharge status: Home / Self Care | Payer: BC Managed Care – PPO | Attending: Internal Medicine | Admitting: Internal Medicine

## 2016-07-06 ENCOUNTER — Encounter (HOSPITAL_COMMUNITY): Payer: Self-pay | Admitting: Emergency Medicine

## 2016-07-06 DIAGNOSIS — E860 Dehydration: Secondary | ICD-10-CM | POA: Diagnosis not present

## 2016-07-06 DIAGNOSIS — E1165 Type 2 diabetes mellitus with hyperglycemia: Secondary | ICD-10-CM | POA: Diagnosis not present

## 2016-07-06 DIAGNOSIS — Z7983 Long term (current) use of bisphosphonates: Secondary | ICD-10-CM

## 2016-07-06 DIAGNOSIS — Z79899 Other long term (current) drug therapy: Secondary | ICD-10-CM

## 2016-07-06 DIAGNOSIS — E875 Hyperkalemia: Secondary | ICD-10-CM | POA: Diagnosis not present

## 2016-07-06 DIAGNOSIS — M8580 Other specified disorders of bone density and structure, unspecified site: Secondary | ICD-10-CM | POA: Diagnosis present

## 2016-07-06 DIAGNOSIS — E861 Hypovolemia: Secondary | ICD-10-CM | POA: Diagnosis present

## 2016-07-06 DIAGNOSIS — N179 Acute kidney failure, unspecified: Secondary | ICD-10-CM | POA: Diagnosis not present

## 2016-07-06 DIAGNOSIS — E1110 Type 2 diabetes mellitus with ketoacidosis without coma: Secondary | ICD-10-CM | POA: Diagnosis not present

## 2016-07-06 DIAGNOSIS — I10 Essential (primary) hypertension: Secondary | ICD-10-CM | POA: Diagnosis not present

## 2016-07-06 DIAGNOSIS — E119 Type 2 diabetes mellitus without complications: Secondary | ICD-10-CM

## 2016-07-06 DIAGNOSIS — Z833 Family history of diabetes mellitus: Secondary | ICD-10-CM

## 2016-07-06 DIAGNOSIS — J4520 Mild intermittent asthma, uncomplicated: Secondary | ICD-10-CM | POA: Diagnosis present

## 2016-07-06 DIAGNOSIS — R739 Hyperglycemia, unspecified: Secondary | ICD-10-CM | POA: Diagnosis present

## 2016-07-06 DIAGNOSIS — Z7982 Long term (current) use of aspirin: Secondary | ICD-10-CM

## 2016-07-06 DIAGNOSIS — Z7984 Long term (current) use of oral hypoglycemic drugs: Secondary | ICD-10-CM

## 2016-07-06 LAB — COMPREHENSIVE METABOLIC PANEL
ALK PHOS: 127 U/L — AB (ref 38–126)
ALT: 33 U/L (ref 14–54)
AST: 21 U/L (ref 15–41)
Albumin: 4.4 g/dL (ref 3.5–5.0)
Anion gap: 15 (ref 5–15)
BUN: 37 mg/dL — ABNORMAL HIGH (ref 6–20)
CALCIUM: 9.5 mg/dL (ref 8.9–10.3)
CO2: 24 mmol/L (ref 22–32)
CREATININE: 2.08 mg/dL — AB (ref 0.44–1.00)
Chloride: 88 mmol/L — ABNORMAL LOW (ref 101–111)
GFR, EST AFRICAN AMERICAN: 28 mL/min — AB (ref >60)
GFR, EST NON AFRICAN AMERICAN: 24 mL/min — AB (ref >60)
Glucose, Bld: 949 mg/dL (ref 65–99)
Potassium: 5.8 mmol/L — ABNORMAL HIGH (ref 3.5–5.1)
SODIUM: 127 mmol/L — AB (ref 135–145)
Total Bilirubin: 0.8 mg/dL (ref 0.3–1.2)
Total Protein: 7.8 g/dL (ref 6.5–8.1)

## 2016-07-06 LAB — CBC
HCT: 37.8 % (ref 36.0–46.0)
Hemoglobin: 12.1 g/dL (ref 12.0–15.0)
MCH: 23.6 pg — ABNORMAL LOW (ref 26.0–34.0)
MCHC: 32 g/dL (ref 30.0–36.0)
MCV: 73.7 fL — ABNORMAL LOW (ref 78.0–100.0)
PLATELETS: 199 10*3/uL (ref 150–400)
RBC: 5.13 MIL/uL — ABNORMAL HIGH (ref 3.87–5.11)
RDW: 14.2 % (ref 11.5–15.5)
WBC: 7.4 10*3/uL (ref 4.0–10.5)

## 2016-07-06 LAB — CBG MONITORING, ED
GLUCOSE-CAPILLARY: 555 mg/dL — AB (ref 65–99)
Glucose-Capillary: 458 mg/dL — ABNORMAL HIGH (ref 65–99)
Glucose-Capillary: >600 mg/dL (ref 65–99)
Glucose-Capillary: >600 mg/dL (ref 65–99)

## 2016-07-06 LAB — BASIC METABOLIC PANEL
ANION GAP: 10 (ref 5–15)
BUN: 32 mg/dL — ABNORMAL HIGH (ref 6–20)
CHLORIDE: 99 mmol/L — AB (ref 101–111)
CO2: 24 mmol/L (ref 22–32)
Calcium: 9.2 mg/dL (ref 8.9–10.3)
Creatinine, Ser: 1.56 mg/dL — ABNORMAL HIGH (ref 0.44–1.00)
GFR calc Af Amer: 39 mL/min — ABNORMAL LOW (ref >60)
GFR calc non Af Amer: 34 mL/min — ABNORMAL LOW (ref >60)
Glucose, Bld: 622 mg/dL (ref 65–99)
POTASSIUM: 5.3 mmol/L — AB (ref 3.5–5.1)
Sodium: 133 mmol/L — ABNORMAL LOW (ref 135–145)

## 2016-07-06 LAB — URINALYSIS, ROUTINE W REFLEX MICROSCOPIC
BILIRUBIN URINE: NEGATIVE
Bacteria, UA: NONE SEEN
HGB URINE DIPSTICK: NEGATIVE
Ketones, ur: 5 mg/dL — AB
Nitrite: NEGATIVE
PH: 5 (ref 5.0–8.0)
Protein, ur: NEGATIVE mg/dL
SPECIFIC GRAVITY, URINE: 1.022 (ref 1.005–1.030)

## 2016-07-06 LAB — MAGNESIUM: Magnesium: 2.1 mg/dL (ref 1.7–2.4)

## 2016-07-06 MED ORDER — ROSUVASTATIN CALCIUM 10 MG PO TABS
20.0000 mg | ORAL_TABLET | Freq: Every day | ORAL | Status: DC
  Administered 2016-07-07 – 2016-07-08 (×2): 20 mg via ORAL
  Filled 2016-07-06 (×2): qty 2

## 2016-07-06 MED ORDER — SODIUM CHLORIDE 0.9 % IV SOLN: INTRAVENOUS | Status: DC

## 2016-07-06 MED ORDER — ALBUTEROL SULFATE HFA 108 (90 BASE) MCG/ACT IN AERS: 2.0000 | INHALATION_SPRAY | RESPIRATORY_TRACT | Status: DC | PRN

## 2016-07-06 MED ORDER — SODIUM CHLORIDE 0.9 % IV SOLN
INTRAVENOUS | Status: DC
  Administered 2016-07-06: 23:00:00 via INTRAVENOUS

## 2016-07-06 MED ORDER — CYCLOBENZAPRINE HCL 10 MG PO TABS
5.0000 mg | ORAL_TABLET | Freq: Every evening | ORAL | Status: DC | PRN
  Filled 2016-07-06: qty 1

## 2016-07-06 MED ORDER — SODIUM CHLORIDE 0.9 % IV BOLUS (SEPSIS)
1000.0000 mL | Freq: Once | INTRAVENOUS | Status: AC
  Administered 2016-07-06: 1000 mL via INTRAVENOUS

## 2016-07-06 MED ORDER — SODIUM CHLORIDE 0.9 % IV SOLN
INTRAVENOUS | Status: DC
  Filled 2016-07-06: qty 2.5

## 2016-07-06 MED ORDER — DEXTROSE-NACL 5-0.45 % IV SOLN
INTRAVENOUS | Status: DC
  Administered 2016-07-07: 05:00:00 via INTRAVENOUS

## 2016-07-06 MED ORDER — SODIUM CHLORIDE 0.9 % IV SOLN
INTRAVENOUS | Status: DC
  Administered 2016-07-06: 5.4 [IU]/h via INTRAVENOUS
  Filled 2016-07-06: qty 2.5

## 2016-07-06 MED ORDER — ALBUTEROL SULFATE (2.5 MG/3ML) 0.083% IN NEBU: 2.5000 mg | INHALATION_SOLUTION | RESPIRATORY_TRACT | Status: DC | PRN

## 2016-07-06 MED ORDER — ADULT MULTIVITAMIN W/MINERALS CH
1.0000 | ORAL_TABLET | Freq: Every day | ORAL | Status: DC
  Administered 2016-07-08 – 2016-07-09 (×2): 1 via ORAL
  Filled 2016-07-06 (×3): qty 1

## 2016-07-06 MED ORDER — ASPIRIN EC 81 MG PO TBEC
81.0000 mg | DELAYED_RELEASE_TABLET | Freq: Every morning | ORAL | Status: DC
  Administered 2016-07-08 – 2016-07-09 (×2): 81 mg via ORAL
  Filled 2016-07-06 (×3): qty 1

## 2016-07-06 MED ORDER — DEXTROSE-NACL 5-0.45 % IV SOLN: INTRAVENOUS | Status: DC

## 2016-07-06 MED ORDER — FERROUS SULFATE 325 (65 FE) MG PO TABS
325.0000 mg | ORAL_TABLET | Freq: Every day | ORAL | Status: DC
  Administered 2016-07-08 – 2016-07-09 (×2): 325 mg via ORAL
  Filled 2016-07-06 (×3): qty 1

## 2016-07-06 MED ORDER — HEPARIN SODIUM (PORCINE) 5000 UNIT/ML IJ SOLN
5000.0000 [IU] | Freq: Three times a day (TID) | INTRAMUSCULAR | Status: DC
  Administered 2016-07-07 – 2016-07-09 (×6): 5000 [IU] via SUBCUTANEOUS
  Filled 2016-07-06 (×8): qty 1

## 2016-07-06 NOTE — ED Triage Notes (Signed)
Patient reports that she is diabetic and takes Metformin and been taking as prescribed but having dizziness and CBG >600 today.

## 2016-07-06 NOTE — ED Provider Notes (Signed)
[facility] DEPT Provider Note   CSN: [id number] Arrival date & time: 07/06/16  1758     History   Chief Complaint Chief Complaint  Patient presents with  . Hyperglycemia  . Dizziness    HPI CAOILAINN SACKS is a 66 y.o. female.  HPI   the patient is  66 year old female with hx of DM - she has had several days of worsening light headedness, denies fever, vomiting or back pain - the sx are gradually worsening - and associated with increased thirst and urinary frequency.  She has had some generalized blurred vision - she has no hx of CAD per pt.  She has been taking Metformin for a year - denies missing meds - has never been on insulin.  She denies any other changes including diarrhea, dysuria, rashes or recent medicine changes.  Past Medical History:  Diagnosis Date  . ALLERGIC RHINITIS 01/29/2007  . Arthritis   . ASTHMA 01/29/2007  . ASTHMA, WITH ACUTE EXACERBATION 06/24/2008  . BACK PAIN 01/29/2009  . CHEST PAIN 06/24/2008  . Family history of adverse reaction to anesthesia    Patients daughter has N/V after anesthesia  . GERD (gastroesophageal reflux disease)   . History of shingles   . HYPERLIPIDEMIA 01/29/2007  . HYPERTENSION 01/29/2007  . OSTEOPENIA 10/16/2007  . Type II or unspecified type diabetes mellitus without mention of complication, uncontrolled 11/29/2013    Patient Active Problem List   Diagnosis Date Noted  . Hyperglycemic crisis in diabetes mellitus ([place]) 07/06/2016  . AKI (acute kidney injury) ([place]) 07/06/2016  . Hyperkalemia 07/06/2016  . S/P laparoscopic cholecystectomy 09/19/2015  . Abnormal chest CT 06/21/2014  . Diabetes mellitus type II, non insulin dependent ([place]) 11/29/2013  . Obesity, unspecified 09/19/2012  . Plantar fasciitis, bilateral 06/08/2011  . BACK PAIN 01/29/2009  . OSTEOPENIA 10/16/2007  . Essential hypertension 01/29/2007    Past Surgical History:  Procedure Laterality Date  . CHOLECYSTECTOMY N/A 09/19/2015   Procedure: LAPAROSCOPIC  CHOLECYSTECTOMY WITH INTRAOPERATIVE CHOLANGIOGRAM;  Surgeon: Jackolyn Confer, MD;  Location: [facility];  Service: General;  Laterality: N/A;  . COLONOSCOPY    . TONSILLECTOMY    . VIDEO BRONCHOSCOPY Bilateral 03/28/2014   Procedure: VIDEO BRONCHOSCOPY WITHOUT FLUORO;  Surgeon: Tanda Rockers, MD;  Location: [facility];  Service: Cardiopulmonary;  Laterality: Bilateral;    OB History    Gravida Para Term Preterm AB Living   5 5 5     5    SAB TAB Ectopic Multiple Live Births           5       Home Medications    Prior to Admission medications   Medication Sig Start Date End Date Taking? Authorizing Provider  albuterol (PROVENTIL HFA;VENTOLIN HFA) 108 (90 Base) MCG/ACT inhaler Inhale 2 puffs into the lungs every 6 (six) hours as needed for wheezing or shortness of breath. 05/22/16  Yes Brunetta Jeans, PA-C  alendronate (FOSAMAX) 70 MG tablet TAKE ONE TABLET BY MOUTH ONCE EVERY 7 DAYS. TAKE  WITH  A  FULL  GLASS  OF  WATER  AND  ON  AN  EMPTY  STOMACH 02/18/15  Yes Biagio Borg, MD  amLODipine-benazepril (LOTREL) 5-20 MG capsule TAKE TWO CAPSULES BY MOUTH ONCE DAILY 06/01/16  Yes Biagio Borg, MD  aspirin 81 MG tablet Take 81 mg by mouth every morning.    Yes Historical Provider, MD  cyclobenzaprine (FLEXERIL) 5 MG tablet 1-2 qhs prn Patient taking differently: Take 5-10 mg  by mouth at bedtime as needed for muscle spasms.  06/21/14  Yes Hendricks Limes, MD  ferrous sulfate 325 (65 FE) MG tablet Take 325 mg by mouth daily with breakfast.   Yes Historical Provider, MD  hydrochlorothiazide (HYDRODIURIL) 25 MG tablet TAKE ONE TABLET BY MOUTH ONCE DAILY 06/01/16  Yes Biagio Borg, MD  metFORMIN (GLUCOPHAGE-XR) 500 MG 24 hr tablet 2 tabs by mouth in the am, and 1 in the PM 09/16/15  Yes Biagio Borg, MD  Multiple Vitamin (MULTIVITAMIN WITH MINERALS) TABS tablet Take 1 tablet by mouth daily.   Yes Historical Provider, MD  potassium chloride (K-DUR) 10 MEQ tablet Take 3 tablets (30 mEq total) by mouth  daily. 03/26/16  Yes Biagio Borg, MD  rosuvastatin (CRESTOR) 20 MG tablet TAKE ONE TABLET BY MOUTH ONCE DAILY 06/01/16  Yes Biagio Borg, MD  benzonatate (TESSALON) 100 MG capsule Take 1 capsule (100 mg total) by mouth 2 (two) times daily as needed for cough. 05/22/16   Brunetta Jeans, PA-C  metFORMIN (GLUCOPHAGE-XR) 500 MG 24 hr tablet TAKE ONE TABLET BY MOUTH ONCE DAILY WITH BREAKFAST Patient not taking: Reported on 07/06/2016 06/01/16   Biagio Borg, MD  ondansetron (ZOFRAN) 4 MG tablet Take 1 tablet (4 mg total) by mouth every 4 (four) hours as needed for nausea or vomiting. 09/19/15   Jackolyn Confer, MD  oxyCODONE (OXY IR/ROXICODONE) 5 MG immediate release tablet Take 1-2 tablets (5-10 mg total) by mouth every 4 (four) hours as needed for moderate pain, severe pain or breakthrough pain. Patient not taking: Reported on 07/06/2016 09/19/15   Jackolyn Confer, MD  pantoprazole (PROTONIX) 40 MG tablet Take 1 tablet (40 mg total) by mouth daily. Patient not taking: Reported on 07/06/2016 08/07/15   Biagio Borg, MD  traMADol (ULTRAM) 50 MG tablet Take 1 tablet (50 mg total) by mouth every 6 (six) hours as needed. Patient not taking: Reported on 07/06/2016 08/04/15   Marella Chimes, PA-C    Family History Family History  Problem Relation Age of Onset  . Cancer Mother     ? type   . Diabetes Father   . Hypertension Father   . Heart disease Father   . Asthma Father   . Asthma Daughter     Social History Social History  Substance Use Topics  . Smoking status: Former Smoker    Quit date: 09/07/2003  . Smokeless tobacco: Never Used  . Alcohol use No     Allergies   Patient has no known allergies.   Review of Systems Review of Systems  All other systems reviewed and are negative.    Physical Exam Updated Vital Signs BP 132/70 (BP Location: Left Arm)   Pulse 78   Temp 98.3 F (36.8 C) (Oral)   Resp 16   SpO2 98%   Physical Exam  Constitutional: She appears well-developed and  well-nourished. No distress.  HENT:  Head: Normocephalic and atraumatic.  Mouth/Throat: Oropharynx is clear and moist. No oropharyngeal exudate.  Eyes: Conjunctivae and EOM are normal. Pupils are equal, round, and reactive to light. Right eye exhibits no discharge. Left eye exhibits no discharge. No scleral icterus.  Neck: Normal range of motion. Neck supple. No JVD present. No thyromegaly present.  Cardiovascular: Normal rate, regular rhythm, normal heart sounds and intact distal pulses.  Exam reveals no gallop and no friction rub.   No murmur heard. Pulmonary/Chest: Effort normal and breath sounds normal. No respiratory distress. She  has no wheezes. She has no rales.  Abdominal: Soft. Bowel sounds are normal. She exhibits no distension and no mass. There is no tenderness.  Musculoskeletal: Normal range of motion. She exhibits no edema or tenderness.  Lymphadenopathy:    She has no cervical adenopathy.  Neurological: She is alert. Coordination normal.  Skin: Skin is warm and dry. No rash noted. No erythema.  Psychiatric: She has a normal mood and affect. Her behavior is normal.  Nursing note and vitals reviewed.    ED Treatments / Results  Labs (all labs ordered are listed, but only abnormal results are displayed) Labs Reviewed  CBC - Abnormal; Notable for the following:       Result Value   RBC 5.13 (*)    MCV 73.7 (*)    MCH 23.6 (*)    All other components within normal limits  URINALYSIS, ROUTINE W REFLEX MICROSCOPIC - Abnormal; Notable for the following:    Glucose, UA >=500 (*)    Ketones, ur 5 (*)    Leukocytes, UA TRACE (*)    Squamous Epithelial / LPF 0-5 (*)    All other components within normal limits  COMPREHENSIVE METABOLIC PANEL - Abnormal; Notable for the following:    Sodium 127 (*)    Potassium 5.8 (*)    Chloride 88 (*)    Glucose, Bld 949 (*)    BUN 37 (*)    Creatinine, Ser 2.08 (*)    Alkaline Phosphatase 127 (*)    GFR calc non Af Amer 24 (*)    GFR  calc Af Amer 28 (*)    All other components within normal limits  CBG MONITORING, ED - Abnormal; Notable for the following:    Glucose-Capillary >600 (*)    All other components within normal limits  CBG MONITORING, ED - Abnormal; Notable for the following:    Glucose-Capillary >600 (*)    All other components within normal limits     Radiology No results found.  Procedures Procedures (including critical care time)  Medications Ordered in ED Medications  insulin regular (NOVOLIN R,HUMULIN R) 250 Units in sodium chloride 0.9 % 250 mL (1 Units/mL) infusion (not administered)  sodium chloride 0.9 % bolus 1,000 mL (not administered)  0.9 %  sodium chloride infusion (not administered)  dextrose 5 %-0.45 % sodium chloride infusion (not administered)  sodium chloride 0.9 % bolus 1,000 mL (1,000 mLs Intravenous New Bag/Given 07/06/16 1855)  sodium chloride 0.9 % bolus 1,000 mL (1,000 mLs Intravenous New Bag/Given 07/06/16 1855)     Initial Impression / Assessment and Plan / ED Course  I have reviewed the triage vital signs and the nursing notes.  Pertinent labs & imaging results that were available during my care of the patient were reviewed by me and considered in my medical decision making (see chart for details).    At this time, the pt is not tachycardic, though her BP is slightly l ow - I will give some IVF to rescucitate volume, check K before insulin - possible DKA - check UA. Pt in agreement.  Labs show that the pt is in acute renal failure - she likely has this from her dehydration with a glucose near 1000, a Cr of 2.08 and a K of 5.8.  She has BUN of 37.  The pt will need a insulin drip and admission  CRITICAL CARE Performed by: Johnna Acosta Total critical care time: 35 minutes Critical care time was exclusive of separately billable  procedures and treating other patients. Critical care was necessary to treat or prevent imminent or life-threatening  deterioration. Critical care was time spent personally by me on the following activities: development of treatment plan with patient and/or surrogate as well as nursing, discussions with consultants, evaluation of patient's response to treatment, examination of patient, obtaining history from patient or surrogate, ordering and performing treatments and interventions, ordering and review of laboratory studies, ordering and review of radiographic studies, pulse oximetry and re-evaluation of patient's condition.   Final Clinical Impressions(s) / ED Diagnoses   Final diagnoses:  Acute hyperglycemia  AKI (acute kidney injury) ([place])  Dehydration  Hyperkalemia      Noemi Chapel, MD 07/06/16 2053

## 2016-07-06 NOTE — H&P (Signed)
History and Physical    DEBORRA PHEGLEY CWC:[id number] DOB: 1951/03/29 DOA: 07/06/2016  PCP: [name], MD   Patient coming from: Home  Chief Complaint: Lightheadedness, uncontrolled blood sugar   HPI: Deborah Neal is a 66 y.o. female with medical history significant for mild intermittent asthma, hypertension, and type 2 diabetes mellitus who presents the emergency department with approximately 3 days of lightheadedness and uncontrolled blood sugars. Patient reports being diagnosed with type 2 diabetes approximately one year ago when started on metformin. She reports strict adherence with this. She denies any recent fevers or chills and denies any recent chest pain or palpitations. For the past 3 days, as lightheadedness has insidiously worsened, she is also experiencing some general malaise. She monitors her blood glucose at home, notes that her sugar to be uncontrolled for the past 3 days, and has had readings of "high" over today. She also reports some blurred vision, polyuria, and polydipsia over this interval, but denies any significant abdominal pain, loss of coordination, or focal numbness or weakness. She denies any use of alcohol or illicit substances and has never experienced similar symptoms previously.  ED Course: Upon arrival to the ED, patient is found to be afebrile, saturating well on room air, with soft but stable blood pressure, and vitals otherwise stable. Chemistry panels notable for sodium of 127, potassium 5.8, BUN 37, creatinine 2.08, and serum glucose of 949. CBC features a microcytosis with MCV 73.7, but no anemia. Urinalysis is notable for glucosuria and small ketones. Patient was given 3 L of normal saline and started on insulin infusion. She remained hemodynamically stable in the ED and no apparent respiratory distress, and she will be observed in the stepdown unit for ongoing evaluation and management of HHS.  Review of Systems:  All other systems reviewed and apart from  HPI, are negative.  Past Medical History:  Diagnosis Date  . ALLERGIC RHINITIS 01/29/2007  . Arthritis   . ASTHMA 01/29/2007  . ASTHMA, WITH ACUTE EXACERBATION 06/24/2008  . BACK PAIN 01/29/2009  . CHEST PAIN 06/24/2008  . Family history of adverse reaction to anesthesia    Patients daughter has N/V after anesthesia  . GERD (gastroesophageal reflux disease)   . History of shingles   . HYPERLIPIDEMIA 01/29/2007  . HYPERTENSION 01/29/2007  . OSTEOPENIA 10/16/2007  . Type II or unspecified type diabetes mellitus without mention of complication, uncontrolled 11/29/2013    Past Surgical History:  Procedure Laterality Date  . CHOLECYSTECTOMY N/A 09/19/2015   Procedure: LAPAROSCOPIC CHOLECYSTECTOMY WITH INTRAOPERATIVE CHOLANGIOGRAM;  Surgeon: Jackolyn Confer, MD;  Location: [facility];  Service: General;  Laterality: N/A;  . COLONOSCOPY    . TONSILLECTOMY    . VIDEO BRONCHOSCOPY Bilateral 03/28/2014   Procedure: VIDEO BRONCHOSCOPY WITHOUT FLUORO;  Surgeon: Tanda Rockers, MD;  Location: [facility];  Service: Cardiopulmonary;  Laterality: Bilateral;     reports that she quit smoking about 12 years ago. She has never used smokeless tobacco. She reports that she does not drink alcohol or use drugs.  No Known Allergies  Family History  Problem Relation Age of Onset  . Cancer Mother     ? type   . Diabetes Father   . Hypertension Father   . Heart disease Father   . Asthma Father   . Asthma Daughter      Prior to Admission medications   Medication Sig Start Date End Date Taking? Authorizing Provider  albuterol (PROVENTIL HFA;VENTOLIN HFA) 108 (90 Base) MCG/ACT inhaler Inhale 2  puffs into the lungs every 6 (six) hours as needed for wheezing or shortness of breath. 05/22/16  Yes Brunetta Jeans, PA-C  alendronate (FOSAMAX) 70 MG tablet TAKE ONE TABLET BY MOUTH ONCE EVERY 7 DAYS. TAKE  WITH  A  FULL  GLASS  OF  WATER  AND  ON  AN  EMPTY  STOMACH 02/18/15  Yes Biagio Borg, MD  amLODipine-benazepril  (LOTREL) 5-20 MG capsule TAKE TWO CAPSULES BY MOUTH ONCE DAILY 06/01/16  Yes Biagio Borg, MD  aspirin 81 MG tablet Take 81 mg by mouth every morning.    Yes Historical Provider, MD  cyclobenzaprine (FLEXERIL) 5 MG tablet 1-2 qhs prn Patient taking differently: Take 5-10 mg by mouth at bedtime as needed for muscle spasms.  06/21/14  Yes Hendricks Limes, MD  ferrous sulfate 325 (65 FE) MG tablet Take 325 mg by mouth daily with breakfast.   Yes Historical Provider, MD  hydrochlorothiazide (HYDRODIURIL) 25 MG tablet TAKE ONE TABLET BY MOUTH ONCE DAILY 06/01/16  Yes Biagio Borg, MD  metFORMIN (GLUCOPHAGE-XR) 500 MG 24 hr tablet 2 tabs by mouth in the am, and 1 in the PM 09/16/15  Yes Biagio Borg, MD  Multiple Vitamin (MULTIVITAMIN WITH MINERALS) TABS tablet Take 1 tablet by mouth daily.   Yes Historical Provider, MD  potassium chloride (K-DUR) 10 MEQ tablet Take 3 tablets (30 mEq total) by mouth daily. 03/26/16  Yes Biagio Borg, MD  rosuvastatin (CRESTOR) 20 MG tablet TAKE ONE TABLET BY MOUTH ONCE DAILY 06/01/16  Yes Biagio Borg, MD  benzonatate (TESSALON) 100 MG capsule Take 1 capsule (100 mg total) by mouth 2 (two) times daily as needed for cough. 05/22/16   Brunetta Jeans, PA-C  ondansetron (ZOFRAN) 4 MG tablet Take 1 tablet (4 mg total) by mouth every 4 (four) hours as needed for nausea or vomiting. 09/19/15   Jackolyn Confer, MD    Physical Exam: Vitals:   07/06/16 1807 07/06/16 1953  BP: 100/59 132/70  Pulse: 94 78  Resp: 16 16  Temp: 98.3 F (36.8 C)   TempSrc: Oral   SpO2: 94% 98%      Constitutional: NAD, calm, comfortable Eyes: PERTLA, lids and conjunctivae normal ENMT: Mucous membranes are dry. Posterior pharynx clear of any exudate or lesions.   Neck: normal, supple, no masses, no thyromegaly Respiratory: clear to auscultation bilaterally, no wheezing, no crackles. Normal respiratory effort.   Cardiovascular: S1 & S2 heard, regular rate and rhythm. No significant JVD. Abdomen:  No distension, no tenderness, no masses palpated. Bowel sounds normal.  Musculoskeletal: no clubbing / cyanosis. No joint deformity upper and lower extremities. Normal muscle tone.  Skin: no significant rashes, lesions, ulcers. Warm, dry, well-perfused. Poor turgor.  Neurologic: CN 2-12 grossly intact. Sensation intact, DTR normal. Strength 5/5 in all 4 limbs.  Psychiatric: Normal judgment and insight. Alert and oriented x 3. Normal mood and affect.     Labs on Admission: I have personally reviewed following labs and imaging studies  CBC:  Recent Labs Lab 07/06/16 1852  WBC 7.4  HGB 12.1  HCT 37.8  MCV 73.7*  PLT 557   Basic Metabolic Panel:  Recent Labs Lab 07/06/16 1852  NA 127*  K 5.8*  CL 88*  CO2 24  GLUCOSE 949*  BUN 37*  CREATININE 2.08*  CALCIUM 9.5   GFR: CrCl cannot be calculated (Unknown ideal weight.). Liver Function Tests:  Recent Labs Lab 07/06/16 1852  AST 21  ALT 33  ALKPHOS 127*  BILITOT 0.8  PROT 7.8  ALBUMIN 4.4   No results for input(s): LIPASE, AMYLASE in the last 168 hours. No results for input(s): AMMONIA in the last 168 hours. Coagulation Profile: No results for input(s): INR, PROTIME in the last 168 hours. Cardiac Enzymes: No results for input(s): CKTOTAL, CKMB, CKMBINDEX, TROPONINI in the last 168 hours. BNP (last 3 results) No results for input(s): PROBNP in the last 8760 hours. HbA1C: No results for input(s): HGBA1C in the last 72 hours. CBG:  Recent Labs Lab 07/06/16 1812 07/06/16 1918  GLUCAP >600* >600*   Lipid Profile: No results for input(s): CHOL, HDL, LDLCALC, TRIG, CHOLHDL, LDLDIRECT in the last 72 hours. Thyroid Function Tests: No results for input(s): TSH, T4TOTAL, FREET4, T3FREE, THYROIDAB in the last 72 hours. Anemia Panel: No results for input(s): VITAMINB12, FOLATE, FERRITIN, TIBC, IRON, RETICCTPCT in the last 72 hours. Urine analysis:    Component Value Date/Time   COLORURINE YELLOW 07/06/2016 1951     APPEARANCEUR CLEAR 07/06/2016 1951   LABSPEC 1.022 07/06/2016 1951   PHURINE 5.0 07/06/2016 1951   GLUCOSEU >=500 (A) 07/06/2016 1951   GLUCOSEU NEGATIVE 09/16/2015 1037   HGBUR NEGATIVE 07/06/2016 [place] NEGATIVE 07/06/2016 1951   KETONESUR 5 (A) 07/06/2016 1951   PROTEINUR NEGATIVE 07/06/2016 1951   UROBILINOGEN 0.2 09/16/2015 1037   NITRITE NEGATIVE 07/06/2016 1951   LEUKOCYTESUR TRACE (A) 07/06/2016 1951   Sepsis Labs: @LABRCNTIP (procalcitonin:4,lacticidven:4) )No results found for this or any previous visit (from the past 240 hour(s)).   Radiological Exams on Admission: No results found.  EKG: Not performed, will obtain as appropriate.   Assessment/Plan  1. Hyperglycemic hyperosmolar state, type II DM  - Pt presents with lightheadedness, general malaise, polyuria, polydipsia, and blurred vision  - She is found to have serum glucose 949 with normal AG and bicarbonate, small urine ketones - No precipitant identified; reports being diagnosed with DM ~1 yr ago, is managed with metformin only, and may be developing insulin-dependence  - She was fluid-resuscitated with NS in ED and started on insulin infusion  - Plan to continue IV insulin with q1h CBG and serial chem panels until [facility] physiology is resolved    2. Acute kidney injury  - SCr is 2.08 on admission, up from a baseline of <1  - Likely a prerenal azotemia in setting of HHS with osmotic diuresis  - Anticipate improvement/resolution with fluid-resuscitation  - Continue IVF with NS, avoid nephrotoxins, hold benazepril and HCTZ, follow serial chem panels as above   3. Hyperkalemia  - Serum potassium is 5.8 on admission in setting of HHS and AKI, and use of K-Dur at home - Anticipate resolution with insulin infusion and IVF; K-Dur held  - She will be monitored on telemetry and serial chem panels will be obtained   4. Hypertension  - BP a little soft on admission - She is managed at home with Norvasc,  benazepril, and HCTZ; these are held on admission given AKI and soft BP  - Monitor and resume treatment as condition allows    5. Asthma - Mild-intermittent; stable on admission  - Continue prn albuterol     DVT prophylaxis: sq heparin Code Status: Full  Family Communication: Discussed with patient Disposition Plan: Observe in SDU Consults called: None Admission status: Observation    Vianne Bulls, MD [facility] Pager [phone]  If 7PM-7AM, please contact night-coverage [email or web address] Password [facility]  07/06/2016, 9:05 PM

## 2016-07-07 DIAGNOSIS — Z7983 Long term (current) use of bisphosphonates: Secondary | ICD-10-CM | POA: Diagnosis not present

## 2016-07-07 DIAGNOSIS — E1165 Type 2 diabetes mellitus with hyperglycemia: Secondary | ICD-10-CM | POA: Diagnosis not present

## 2016-07-07 DIAGNOSIS — Z7984 Long term (current) use of oral hypoglycemic drugs: Secondary | ICD-10-CM | POA: Diagnosis not present

## 2016-07-07 DIAGNOSIS — E875 Hyperkalemia: Secondary | ICD-10-CM

## 2016-07-07 DIAGNOSIS — I10 Essential (primary) hypertension: Secondary | ICD-10-CM

## 2016-07-07 DIAGNOSIS — E1110 Type 2 diabetes mellitus with ketoacidosis without coma: Secondary | ICD-10-CM | POA: Diagnosis present

## 2016-07-07 DIAGNOSIS — N179 Acute kidney failure, unspecified: Secondary | ICD-10-CM | POA: Diagnosis present

## 2016-07-07 DIAGNOSIS — R739 Hyperglycemia, unspecified: Secondary | ICD-10-CM | POA: Diagnosis present

## 2016-07-07 DIAGNOSIS — E1310 Other specified diabetes mellitus with ketoacidosis without coma: Secondary | ICD-10-CM

## 2016-07-07 DIAGNOSIS — Z833 Family history of diabetes mellitus: Secondary | ICD-10-CM | POA: Diagnosis not present

## 2016-07-07 DIAGNOSIS — E119 Type 2 diabetes mellitus without complications: Secondary | ICD-10-CM

## 2016-07-07 DIAGNOSIS — E876 Hypokalemia: Secondary | ICD-10-CM | POA: Diagnosis not present

## 2016-07-07 DIAGNOSIS — Z7982 Long term (current) use of aspirin: Secondary | ICD-10-CM | POA: Diagnosis not present

## 2016-07-07 DIAGNOSIS — E860 Dehydration: Secondary | ICD-10-CM | POA: Insufficient documentation

## 2016-07-07 DIAGNOSIS — M8580 Other specified disorders of bone density and structure, unspecified site: Secondary | ICD-10-CM | POA: Diagnosis present

## 2016-07-07 DIAGNOSIS — Z79899 Other long term (current) drug therapy: Secondary | ICD-10-CM | POA: Diagnosis not present

## 2016-07-07 DIAGNOSIS — J4520 Mild intermittent asthma, uncomplicated: Secondary | ICD-10-CM | POA: Diagnosis present

## 2016-07-07 DIAGNOSIS — E861 Hypovolemia: Secondary | ICD-10-CM | POA: Diagnosis present

## 2016-07-07 LAB — BASIC METABOLIC PANEL
ANION GAP: 6 (ref 5–15)
Anion gap: 8 (ref 5–15)
BUN: 30 mg/dL — AB (ref 6–20)
BUN: 28 mg/dL — AB (ref 6–20)
CO2: 28 mmol/L (ref 22–32)
CO2: 28 mmol/L (ref 22–32)
Calcium: 9.3 mg/dL (ref 8.9–10.3)
Calcium: 9.3 mg/dL (ref 8.9–10.3)
Chloride: 103 mmol/L (ref 101–111)
Chloride: 101 mmol/L (ref 101–111)
Creatinine, Ser: 1.45 mg/dL — ABNORMAL HIGH (ref 0.44–1.00)
Creatinine, Ser: 1.17 mg/dL — ABNORMAL HIGH (ref 0.44–1.00)
GFR calc Af Amer: 43 mL/min — ABNORMAL LOW (ref >60)
GFR calc Af Amer: 55 mL/min — ABNORMAL LOW (ref >60)
GFR calc non Af Amer: 48 mL/min — ABNORMAL LOW (ref >60)
GFR, EST NON AFRICAN AMERICAN: 37 mL/min — AB (ref >60)
Glucose, Bld: 321 mg/dL — ABNORMAL HIGH (ref 65–99)
Glucose, Bld: 123 mg/dL — ABNORMAL HIGH (ref 65–99)
POTASSIUM: 3.8 mmol/L (ref 3.5–5.1)
POTASSIUM: 3.4 mmol/L — AB (ref 3.5–5.1)
SODIUM: 137 mmol/L (ref 135–145)
SODIUM: 137 mmol/L (ref 135–145)

## 2016-07-07 LAB — GLUCOSE, CAPILLARY
GLUCOSE-CAPILLARY: 445 mg/dL — AB (ref 65–99)
GLUCOSE-CAPILLARY: 335 mg/dL — AB (ref 65–99)
Glucose-Capillary: 368 mg/dL — ABNORMAL HIGH (ref 65–99)

## 2016-07-07 LAB — CBG MONITORING, ED
Glucose-Capillary: 309 mg/dL — ABNORMAL HIGH (ref 65–99)
Glucose-Capillary: 242 mg/dL — ABNORMAL HIGH (ref 65–99)
Glucose-Capillary: 224 mg/dL — ABNORMAL HIGH (ref 65–99)
Glucose-Capillary: 126 mg/dL — ABNORMAL HIGH (ref 65–99)
Glucose-Capillary: 95 mg/dL (ref 65–99)
Glucose-Capillary: 274 mg/dL — ABNORMAL HIGH (ref 65–99)
Glucose-Capillary: 286 mg/dL — ABNORMAL HIGH (ref 65–99)
Glucose-Capillary: 455 mg/dL — ABNORMAL HIGH (ref 65–99)

## 2016-07-07 LAB — GLUCOSE, RANDOM: Glucose, Bld: 509 mg/dL (ref 65–99)

## 2016-07-07 MED ORDER — INSULIN ASPART 100 UNIT/ML ~~LOC~~ SOLN
5.0000 [IU] | Freq: Once | SUBCUTANEOUS | Status: AC
  Administered 2016-07-07: 5 [IU] via SUBCUTANEOUS

## 2016-07-07 MED ORDER — INSULIN GLARGINE 100 UNIT/ML ~~LOC~~ SOLN
10.0000 [IU] | Freq: Two times a day (BID) | SUBCUTANEOUS | Status: DC
  Administered 2016-07-07 – 2016-07-08 (×2): 10 [IU] via SUBCUTANEOUS
  Filled 2016-07-07 (×3): qty 0.1

## 2016-07-07 MED ORDER — INSULIN ASPART 100 UNIT/ML ~~LOC~~ SOLN: 0.0000 [IU] | Freq: Three times a day (TID) | SUBCUTANEOUS | Status: DC

## 2016-07-07 MED ORDER — INSULIN GLARGINE 100 UNIT/ML ~~LOC~~ SOLN
10.0000 [IU] | Freq: Every day | SUBCUTANEOUS | Status: DC
Start: 2016-07-07 — End: 2016-07-07
  Administered 2016-07-07: 10 [IU] via SUBCUTANEOUS
  Filled 2016-07-07: qty 0.1

## 2016-07-07 MED ORDER — INSULIN ASPART 100 UNIT/ML ~~LOC~~ SOLN
12.0000 [IU] | Freq: Once | SUBCUTANEOUS | Status: AC
  Administered 2016-07-07: 12 [IU] via SUBCUTANEOUS

## 2016-07-07 MED ORDER — INSULIN ASPART 100 UNIT/ML ~~LOC~~ SOLN
18.0000 [IU] | Freq: Once | SUBCUTANEOUS | Status: AC
  Administered 2016-07-07: 18 [IU] via SUBCUTANEOUS

## 2016-07-07 MED ORDER — INSULIN ASPART 100 UNIT/ML ~~LOC~~ SOLN
5.0000 [IU] | Freq: Once | SUBCUTANEOUS | Status: AC
Start: 2016-07-08 — End: 2016-07-08
  Administered 2016-07-08: 5 [IU] via SUBCUTANEOUS

## 2016-07-07 NOTE — Progress Notes (Signed)
Glucose in ED 455 at 1144. Pt did not arrive to [facility] until 1240. STAT blood glucose was checked by lab. Result was 509. MD paged with result awaiting call back.

## 2016-07-07 NOTE — ED Notes (Signed)
Click K test completed by error

## 2016-07-07 NOTE — Progress Notes (Signed)
PROGRESS NOTE    Deborah Neal  BTD:[id number] DOB: 09/13/1950 DOA: 07/06/2016 PCP: Deborah Cower, MD    Brief Narrative:  66 yo female with T2DM, presented with 3 days of worsening lightheadedness and uncontrolled hyperglycemia, associated with polyuria and polydipsia. On the initial physical examination, hemodynamically stable, dehydrated, serum glucose greater then 900. Patient placed on insulin drip, with improvement of glucose, then transitioned to sq regimen.     Assessment & Plan:   Principal Problem:   Hyperglycemic crisis in diabetes mellitus ([place]) Active Problems:   Essential hypertension   Diabetes mellitus type II, non insulin dependent (HCC)   AKI (acute kidney injury) ([place])   Hyperkalemia   Hyperglycemia   1. Diabetes ketoacidosis. Patient on insulin drip in the emergency department, successfully transitioned to sq insulin, will use 10 units of levimir and insulin sliding scale moderate resistance, continue to calculate insulin requirements. Hold on oral hypoglycemic agents for now. Small anion gap that has been closed.   2. T2DM. Patient reported well controlled glucose until last few days, will check hb a1c, continue insulin regimen for now.   3. AKI. Renal function with cr down to 1,17 from 2.0, cw pre-renal renal failure due to hyperglycemia and hypovolemia. Will continue gentle hydration, follow on renal panel in am, k at 3,4 with serum bicarbonate at 28. Avoid hypotension and nephrotoxic medications.   4. Hyperkalemia. K corrected down to 3,4 from 5,8, will continue gentle hydration and follow on renal panel in am. Hold on potassium supplements.   5. Hypertension. Systolic blood pressure 106'Y . Will continue to hold on antihypertensive agents (amlodipine. hctz).     6. Asthma. Controlled with no signs of exacerbation.    DVT prophylaxis: sq heparin  Code Status: full  Family Communication: I spoke with patient's family at the bedside and all questions were  addressed.  Disposition Plan: home   Consultants:     Procedures:      Antimicrobials:       Subjective: Patient with increase appetite, no nausea or vomiting. Tolerated well clear liquid diet. Patient's glucose has been well controlled in the outpatient setting, no recent change in medical therapy. Patient had increase physical activity.   Objective: Vitals:   07/07/16 0603 07/07/16 0615 07/07/16 0812 07/07/16 1013  BP: 116/74  113/73 128/70  Pulse: 74 72 64 70  Resp: 19 18 17 15   Temp:      TempSrc:      SpO2: 95% 96% 92% 98%   No intake or output data in the 24 hours ending 07/07/16 1245 There were no vitals filed for this visit.  Examination:  General exam: deconditioned E ENT: no pallor or icterus, oral mucosa moist.   Respiratory system: Clear to auscultation. Respiratory effort normal. No wheezing, rales or rhonchi.  Cardiovascular system: S1 & S2 heard, RRR. No JVD, murmurs, rubs, gallops or clicks. No pedal edema. Gastrointestinal system: Abdomen is nondistended, soft and nontender. No organomegaly or masses felt. Normal bowel sounds heard. Central nervous system: Alert and oriented. No focal neurological deficits. Extremities: Symmetric 5 x 5 power. Skin: No rashes, lesions or ulcers .     Data Reviewed: I have personally reviewed following labs and imaging studies  CBC:  Recent Labs Lab 07/06/16 1852  WBC 7.4  HGB 12.1  HCT 37.8  MCV 73.7*  PLT 694   Basic Metabolic Panel:  Recent Labs Lab 07/06/16 1852 07/06/16 2209 07/07/16 0130 07/07/16 0644  NA 127* 133* 137 137  K 5.8* 5.3* 3.8 3.4*  CL 88* 99* 103 101  CO2 24 24 28 28   GLUCOSE 949* 622* 321* 123*  BUN 37* 32* 30* 28*  CREATININE 2.08* 1.56* 1.45* 1.17*  CALCIUM 9.5 9.2 9.3 9.3  MG  --  2.1  --   --    GFR: CrCl cannot be calculated (Unknown ideal weight.). Liver Function Tests:  Recent Labs Lab 07/06/16 1852  AST 21  ALT 33  ALKPHOS 127*  BILITOT 0.8  PROT 7.8    ALBUMIN 4.4   No results for input(s): LIPASE, AMYLASE in the last 168 hours. No results for input(s): AMMONIA in the last 168 hours. Coagulation Profile: No results for input(s): INR, PROTIME in the last 168 hours. Cardiac Enzymes: No results for input(s): CKTOTAL, CKMB, CKMBINDEX, TROPONINI in the last 168 hours. BNP (last 3 results) No results for input(s): PROBNP in the last 8760 hours. HbA1C: No results for input(s): HGBA1C in the last 72 hours. CBG:  Recent Labs Lab 07/07/16 0502 07/07/16 0623 07/07/16 0828 07/07/16 1005 07/07/16 1144  GLUCAP 126* 95 274* 286* 455*   Lipid Profile: No results for input(s): CHOL, HDL, LDLCALC, TRIG, CHOLHDL, LDLDIRECT in the last 72 hours. Thyroid Function Tests: No results for input(s): TSH, T4TOTAL, FREET4, T3FREE, THYROIDAB in the last 72 hours. Anemia Panel: No results for input(s): VITAMINB12, FOLATE, FERRITIN, TIBC, IRON, RETICCTPCT in the last 72 hours. Sepsis Labs: No results for input(s): PROCALCITON, LATICACIDVEN in the last 168 hours.  No results found for this or any previous visit (from the past 240 hour(s)).       Radiology Studies: No results found.      Scheduled Meds: . aspirin EC  81 mg Oral q morning - 10a  . ferrous sulfate  325 mg Oral Q breakfast  . heparin  5,000 Units Subcutaneous Q8H  . insulin aspart  0-9 Units Subcutaneous TID WC  . insulin glargine  10 Units Subcutaneous Daily  . multivitamin with minerals  1 tablet Oral Daily  . rosuvastatin  20 mg Oral q1800   Continuous Infusions: . sodium chloride Stopped (07/07/16 0511)  . insulin (NOVOLIN-R) infusion Stopped (07/07/16 4403)     LOS: 0 days    Deborah Millers, MD [facility] Pager [phone]  If 7PM-7AM, please contact night-coverage [email or web address] Password [facility] 07/07/2016, 12:45 PM

## 2016-07-08 DIAGNOSIS — E876 Hypokalemia: Secondary | ICD-10-CM

## 2016-07-08 LAB — GLUCOSE, CAPILLARY
GLUCOSE-CAPILLARY: 285 mg/dL — AB (ref 65–99)
GLUCOSE-CAPILLARY: 292 mg/dL — AB (ref 65–99)
GLUCOSE-CAPILLARY: 346 mg/dL — AB (ref 65–99)
GLUCOSE-CAPILLARY: 385 mg/dL — AB (ref 65–99)
Glucose-Capillary: 368 mg/dL — ABNORMAL HIGH (ref 65–99)
Glucose-Capillary: 292 mg/dL — ABNORMAL HIGH (ref 65–99)

## 2016-07-08 LAB — BASIC METABOLIC PANEL
Anion gap: 7 (ref 5–15)
BUN: 22 mg/dL — ABNORMAL HIGH (ref 6–20)
CO2: 27 mmol/L (ref 22–32)
Calcium: 9 mg/dL (ref 8.9–10.3)
Chloride: 104 mmol/L (ref 101–111)
Creatinine, Ser: 1 mg/dL (ref 0.44–1.00)
GFR calc Af Amer: >60 mL/min (ref >60)
GFR calc non Af Amer: 58 mL/min — ABNORMAL LOW (ref >60)
Glucose, Bld: 368 mg/dL — ABNORMAL HIGH (ref 65–99)
Potassium: 3.3 mmol/L — ABNORMAL LOW (ref 3.5–5.1)
Sodium: 138 mmol/L (ref 135–145)

## 2016-07-08 MED ORDER — INSULIN ASPART 100 UNIT/ML ~~LOC~~ SOLN
0.0000 [IU] | SUBCUTANEOUS | Status: DC
  Administered 2016-07-08 (×2): 8 [IU] via SUBCUTANEOUS
  Administered 2016-07-08: 15 [IU] via SUBCUTANEOUS
  Administered 2016-07-08: 11 [IU] via SUBCUTANEOUS
  Administered 2016-07-09: 3 [IU] via SUBCUTANEOUS
  Administered 2016-07-09: 8 [IU] via SUBCUTANEOUS
  Administered 2016-07-09: 11 [IU] via SUBCUTANEOUS
  Administered 2016-07-09: 2 [IU] via SUBCUTANEOUS

## 2016-07-08 MED ORDER — POTASSIUM CHLORIDE 20 MEQ/15ML (10%) PO SOLN
40.0000 meq | ORAL | Status: AC
  Administered 2016-07-08 (×2): 40 meq via ORAL
  Filled 2016-07-08 (×3): qty 30

## 2016-07-08 MED ORDER — INSULIN DETEMIR 100 UNIT/ML ~~LOC~~ SOLN
5.0000 [IU] | Freq: Once | SUBCUTANEOUS | Status: AC
  Administered 2016-07-08: 5 [IU] via SUBCUTANEOUS
  Filled 2016-07-08: qty 0.05

## 2016-07-08 MED ORDER — SODIUM CHLORIDE 0.45 % IV SOLN
INTRAVENOUS | Status: DC
  Administered 2016-07-08: 19:00:00 via INTRAVENOUS

## 2016-07-08 MED ORDER — INSULIN GLARGINE 100 UNIT/ML ~~LOC~~ SOLN
15.0000 [IU] | Freq: Two times a day (BID) | SUBCUTANEOUS | Status: DC
  Administered 2016-07-08 – 2016-07-09 (×2): 15 [IU] via SUBCUTANEOUS
  Filled 2016-07-08 (×3): qty 0.15

## 2016-07-08 MED ORDER — INSULIN ASPART 100 UNIT/ML ~~LOC~~ SOLN
8.0000 [IU] | Freq: Once | SUBCUTANEOUS | Status: AC
  Administered 2016-07-08: 8 [IU] via SUBCUTANEOUS

## 2016-07-08 NOTE — Progress Notes (Signed)
Inpatient Diabetes Program Recommendations  AACE/ADA: New Consensus Statement on Inpatient Glycemic Control (2015)  Target Ranges:  Prepandial:   less than 140 mg/dL      Peak postprandial:   less than 180 mg/dL (1-2 hours)      Critically ill patients:  140 - 180 mg/dL   Lab Results  Component Value Date   GLUCAP 292 (H) 07/08/2016   HGBA1C 7.7 (H) 03/23/2016    Review of Glycemic Control  Diabetes history: DM2 Outpatient Diabetes medications: metformin 1000 mg in am, 500 mg in pm Current orders for Inpatient glycemic control: Lantus 10 units bid, Novolog 0-15 units Q4H  HgbA1C 7.7%. Would benefit from improved glucose control at home.  Inpatient Diabetes Program Recommendations:    Change Novolog to 0-15 units tidwc and hs Add Novolog 4 units tidwc for meal coverage insulin. Needs to f/u with PCP for diabetes management.  Will speak with pt today regarding her glycemic control at home.  Continue to follow. Thank you. Lorenda Peck, RD, LDN, CDE Inpatient Diabetes Coordinator [phone]

## 2016-07-08 NOTE — Progress Notes (Signed)
PROGRESS NOTE    Deborah Neal  KXF:[id number] DOB: 1951-04-09 DOA: 07/06/2016 PCP: [name], MD     Brief Narrative:  66 yo female with T2DM, presented with 3 days of worsening lightheadedness and uncontrolled hyperglycemia, associated with polyuria and polydipsia. On the initial physical examination, hemodynamically stable, dehydrated, serum glucose greater then 900. Patient placed on insulin drip, with improvement of glucose, then transitioned to sq regimen. Requiring high doses of sq insulin.    Assessment & Plan:   Principal Problem:   Hyperglycemic crisis in diabetes mellitus ([place]) Active Problems:   Essential hypertension   Diabetes mellitus type II, non insulin dependent (HCC)   AKI (acute kidney injury) ([place])   Hyperkalemia   Hyperglycemia    1. Diabetes ketoacidosis. Patient tolerating well po, no nausea or vomiting, anion gap has been closed. Persistent hyperglycemia, with capillary glucose up to 300, will increase basal regimen to 15 units bid and will continue insulin sliding scale to calculate insulin requirements. Patient will need home glucometer.   2. T2DM. Suspected poor glycemic control at home, patient will need home glucometer. Follow on Hb a1c. Patient will need insulin at discharge.   3. AKI. Renal function with cr at 1.0 with serum bicarb at 27 and anion gap 7. Will continue gentle hydration with saline follow on renal panel in am. Will correct hypokalemia with oral kcl.   4. Hyperkalemia. Will continue K supplementation, target K at 4, will check serum mag in am. Continue hydration with IV fluids.   5. Hypertension. Systolic blood pressure 696'V . Holding antihypertensive agents (amlodipine. hctz), for now to prevent hypotension.     6. Asthma. Controlled with no signs of exacerbation.    DVT prophylaxis: sq heparin  Code Status: full  Family Communication: I spoke with patient's family at the bedside and all questions were addressed.    Disposition Plan: home    Consultants:     Procedures:     Antimicrobials:      Subjective: Patient tolerating po well, no nausea or vomiting, capillary glucose still elevated, required high doses of insulin yesterday. At home not checking glucose.   Objective: Vitals:   07/07/16 1244 07/07/16 1454 07/07/16 2015 07/08/16 0415  BP: 135/77 115/60 125/78 128/74  Pulse: 80 73 80 65  Resp: 20 18 20 18   Temp: 98.8 F (37.1 C) 98.4 F (36.9 C) 99.1 F (37.3 C) 98.6 F (37 C)  TempSrc: Oral Oral Oral Oral  SpO2: 100% 98% 100% 94%    Intake/Output Summary (Last 24 hours) at 07/08/16 1313 Last data filed at 07/08/16 0919  Gross per 24 hour  Intake           2302.5 ml  Output                1 ml  Net           2301.5 ml   There were no vitals filed for this visit.  Examination:  General exam: not in pain or dyspnea E ENT: no pallor or icterus Respiratory system: Clear to auscultation. Respiratory effort normal. No wheezing, rales or rhonchi.  Cardiovascular system: S1 & S2 heard, RRR. No JVD, murmurs, rubs, gallops or clicks. No pedal edema. Gastrointestinal system: Abdomen is nondistended, soft and nontender. No organomegaly or masses felt. Normal bowel sounds heard. Central nervous system: Alert and oriented. No focal neurological deficits. Extremities: Symmetric 5 x 5 power. Skin: No rashes, lesions or ulcers     Data Reviewed:  I have personally reviewed following labs and imaging studies  CBC:  Recent Labs Lab 07/06/16 1852  WBC 7.4  HGB 12.1  HCT 37.8  MCV 73.7*  PLT 295   Basic Metabolic Panel:  Recent Labs Lab 07/06/16 1852 07/06/16 2209 07/07/16 0130 07/07/16 0644 07/07/16 1251 07/08/16 0602  NA 127* 133* 137 137  --  138  K 5.8* 5.3* 3.8 3.4*  --  3.3*  CL 88* 99* 103 101  --  104  CO2 24 24 28 28   --  27  GLUCOSE 949* 622* 321* 123* 509* 368*  BUN 37* 32* 30* 28*  --  22*  CREATININE 2.08* 1.56* 1.45* 1.17*  --  1.00  CALCIUM 9.5  9.2 9.3 9.3  --  9.0  MG  --  2.1  --   --   --   --    GFR: CrCl cannot be calculated (Unknown ideal weight.). Liver Function Tests:  Recent Labs Lab 07/06/16 1852  AST 21  ALT 33  ALKPHOS 127*  BILITOT 0.8  PROT 7.8  ALBUMIN 4.4   No results for input(s): LIPASE, AMYLASE in the last 168 hours. No results for input(s): AMMONIA in the last 168 hours. Coagulation Profile: No results for input(s): INR, PROTIME in the last 168 hours. Cardiac Enzymes: No results for input(s): CKTOTAL, CKMB, CKMBINDEX, TROPONINI in the last 168 hours. BNP (last 3 results) No results for input(s): PROBNP in the last 8760 hours. HbA1C: No results for input(s): HGBA1C in the last 72 hours. CBG:  Recent Labs Lab 07/07/16 2019 07/07/16 2348 07/08/16 0411 07/08/16 0730 07/08/16 1156  GLUCAP 335* 368* 368* 292* 346*   Lipid Profile: No results for input(s): CHOL, HDL, LDLCALC, TRIG, CHOLHDL, LDLDIRECT in the last 72 hours. Thyroid Function Tests: No results for input(s): TSH, T4TOTAL, FREET4, T3FREE, THYROIDAB in the last 72 hours. Anemia Panel: No results for input(s): VITAMINB12, FOLATE, FERRITIN, TIBC, IRON, RETICCTPCT in the last 72 hours. Sepsis Labs: No results for input(s): PROCALCITON, LATICACIDVEN in the last 168 hours.  No results found for this or any previous visit (from the past 240 hour(s)).       Radiology Studies: No results found.      Scheduled Meds: . aspirin EC  81 mg Oral q morning - 10a  . ferrous sulfate  325 mg Oral Q breakfast  . heparin  5,000 Units Subcutaneous Q8H  . insulin aspart  0-15 Units Subcutaneous Q4H  . insulin detemir  5 Units Subcutaneous Once  . insulin glargine  15 Units Subcutaneous BID  . multivitamin with minerals  1 tablet Oral Daily  . rosuvastatin  20 mg Oral q1800   Continuous Infusions:   LOS: 1 day     [name], MD [facility] Pager [phone]  If 7PM-7AM, please contact  night-coverage [email or web address] Password [email or web address] 07/08/2016, 1:13 PM

## 2016-07-09 ENCOUNTER — Ambulatory Visit: Payer: BC Managed Care – PPO | Admitting: Internal Medicine

## 2016-07-09 DIAGNOSIS — E860 Dehydration: Secondary | ICD-10-CM

## 2016-07-09 LAB — BASIC METABOLIC PANEL
Anion gap: 6 (ref 5–15)
BUN: 14 mg/dL (ref 6–20)
CALCIUM: 9.3 mg/dL (ref 8.9–10.3)
CO2: 28 mmol/L (ref 22–32)
Chloride: 104 mmol/L (ref 101–111)
Creatinine, Ser: 0.84 mg/dL (ref 0.44–1.00)
GFR calc Af Amer: >60 mL/min (ref >60)
Glucose, Bld: 154 mg/dL — ABNORMAL HIGH (ref 65–99)
Potassium: 3.6 mmol/L (ref 3.5–5.1)
Sodium: 138 mmol/L (ref 135–145)

## 2016-07-09 LAB — GLUCOSE, CAPILLARY
GLUCOSE-CAPILLARY: 145 mg/dL — AB (ref 65–99)
GLUCOSE-CAPILLARY: 193 mg/dL — AB (ref 65–99)
Glucose-Capillary: 304 mg/dL — ABNORMAL HIGH (ref 65–99)

## 2016-07-09 LAB — MAGNESIUM: Magnesium: 1.9 mg/dL (ref 1.7–2.4)

## 2016-07-09 MED ORDER — INSULIN ASPART 100 UNIT/ML ~~LOC~~ SOLN: 0.0000 [IU] | Freq: Three times a day (TID) | SUBCUTANEOUS | 11 refills | Status: DC

## 2016-07-09 MED ORDER — INSULIN GLARGINE 100 UNIT/ML ~~LOC~~ SOLN: 15.0000 [IU] | Freq: Two times a day (BID) | SUBCUTANEOUS | 11 refills | Status: DC

## 2016-07-09 NOTE — Discharge Summary (Signed)
Physician Discharge Summary  Deborah DEVINCENTIS IZT:[id number] DOB: 1951/01/12 DOA: 07/06/2016  PCP: Deborah Cower, MD  Admit date: 07/06/2016 Discharge date: 07/09/2016  Admitted From: Home  Disposition:  Home   Recommendations for Outpatient Follow-up:  1. Follow up with PCP in 1- week 2. Patient has been placed on insulin regimen 3. For now holding on metformin.  Home Health: No  Equipment/Devices: No   Discharge Condition: Stable CODE STATUS: Full  Diet recommendation: Heart Healthy / Carb Modified   Brief/Interim Summary: This is a 66 year old female who presented to the hospital with a chief complaint of lightheadedness. For last 3 days prior to hospitalization patient had noticed lightheadedness and uncontrolled blood sugars, associated with blurred vision, polyuria and polydipsia. Her blood pressure was 100/59, heart rate 94, respiratory 16, temperature 98.3, oxygen saturation 94%. Her mucosa was dry, her lungs were clear to auscultation, heart S1-S2 present rhythmic, her abdomen was soft,no lower extremity edema. Na 127, potassium 5.8, chloride 98, bicarbonate 24, glucose 949, BUN 37, creatinine 2.0, anion gap 15, white count 7.4, hemoglobin 12.1, hematocrit 37.8, platelets 199. Urinalysis with 0-5 white cells, negative nitrates, more than 500 glucose, negative proteins.  Patient was admitted to the hospital with the working diagnosis of DKA complicated by acute kidney injury and hyperkalemia.  1. Diabetes ketoacidosis. Patient received IV fluids, she was placed on insulin drip, anion gap was closed, she was successfully transitioned to subcutaneous insulin. Insulin regimen was adjusted, capillary glucose by the day of discharge is 145-193. Patient will be discharged on basal insulin 15 units of Levemir bid and insulin sliding-scale, diabetic teaching, she'll be trained to self inject subcutaneous insulin. She was instructed to check her capillary glucose before meals and keep a log. Metformin  will be held for now.   2. Type 2 diabetes mellitus. Hemoglobin A1c is pending, patient has been placed on insulin regimen. Follow-up as an outpatient.  3. Acute kidney injury and hyperkalemia. Patient's kidney function improved with supportive IV fluids, her discharge creatinine is 0.84. She'll resume her hydrochlorothiazide for blood pressure control. At home patient is on potassium supplements that will be continued, recommendation for close follow-up on kidney function and electrolytes.  4. Hypertension. Patient's antihypertensive agents were held during her hospitalization to prevent hypotension, by the time of discharge she will resume amlodipine and hydrochlorothiazide. Discharge systolic blood pressure 382 to 140.  5. Asthma. Remained well-controlled. Continue as needed albuterol rescue inhaler.   Discharge Diagnoses:  Principal Problem:   Hyperglycemic crisis in diabetes mellitus ([place]) Active Problems:   Essential hypertension   Diabetes mellitus type II, non insulin dependent (HCC)   AKI (acute kidney injury) ([place])   Hyperkalemia   Hyperglycemia    Discharge Instructions   Allergies as of 07/09/2016   No Known Allergies     Medication List    STOP taking these medications   metFORMIN 500 MG 24 hr tablet Commonly known as:  GLUCOPHAGE-XR     TAKE these medications   albuterol 108 (90 Base) MCG/ACT inhaler Commonly known as:  PROVENTIL HFA;VENTOLIN HFA Inhale 2 puffs into the lungs every 6 (six) hours as needed for wheezing or shortness of breath.   alendronate 70 MG tablet Commonly known as:  FOSAMAX TAKE ONE TABLET BY MOUTH ONCE EVERY 7 DAYS. TAKE  WITH  A  FULL  GLASS  OF  WATER  AND  ON  AN  EMPTY  STOMACH   amLODipine-benazepril 5-20 MG capsule Commonly known as:  LOTREL TAKE TWO  CAPSULES BY MOUTH ONCE DAILY   aspirin 81 MG tablet Take 81 mg by mouth every morning.   benzonatate 100 MG capsule Commonly known as:  TESSALON Take 1 capsule (100 mg  total) by mouth 2 (two) times daily as needed for cough.   cyclobenzaprine 5 MG tablet Commonly known as:  FLEXERIL 1-2 qhs prn What changed:  how much to take  how to take this  when to take this  reasons to take this  additional instructions   ferrous sulfate 325 (65 FE) MG tablet Take 325 mg by mouth daily with breakfast.   hydrochlorothiazide 25 MG tablet Commonly known as:  HYDRODIURIL TAKE ONE TABLET BY MOUTH ONCE DAILY   insulin aspart 100 UNIT/ML injection Commonly known as:  novoLOG Inject 0-15 Units into the skin 3 (three) times daily before meals. Glucose 150-200 use 2 units, 201-250 use 4 units, 251 to 300 use 6 units, 301-350 use 8 units, 351 or greater use 10 units.   insulin glargine 100 UNIT/ML injection Commonly known as:  LANTUS Inject 0.15 mLs (15 Units total) into the skin 2 (two) times daily.   multivitamin with minerals Tabs tablet Take 1 tablet by mouth daily.   ondansetron 4 MG tablet Commonly known as:  ZOFRAN Take 1 tablet (4 mg total) by mouth every 4 (four) hours as needed for nausea or vomiting.   potassium chloride 10 MEQ tablet Commonly known as:  K-DUR Take 3 tablets (30 mEq total) by mouth daily.   rosuvastatin 20 MG tablet Commonly known as:  CRESTOR TAKE ONE TABLET BY MOUTH ONCE DAILY            Durable Medical Equipment        Start     Ordered   07/09/16 0000  DME Other see comment    Comments:  glucometer   07/09/16 1115     Follow-up Information    Deborah Cower, MD Follow up in 1 week(s).   Specialties:  Internal Medicine, Radiology Contact information: [place] [address] [phone]          No Known Allergies  Consultations:     Procedures/Studies:  No results found.     Subjective: Patient feeling well, no nausea or vomiting, tolerating po well, no dyspnea or chest pain.   Discharge Exam: Vitals:   07/08/16 2002 07/09/16 0408  BP: 132/73 (!) 140/91  Pulse: 74  64  Resp: 20 18  Temp: 98.9 F (37.2 C) 98.5 F (36.9 C)   Vitals:   07/08/16 0415 07/08/16 1300 07/08/16 2002 07/09/16 0408  BP: 128/74 124/65 132/73 (!) 140/91  Pulse: 65 73 74 64  Resp: 18 18 20 18   Temp: 98.6 F (37 C) 98.1 F (36.7 C) 98.9 F (37.2 C) 98.5 F (36.9 C)  TempSrc: Oral Oral Oral Oral  SpO2: 94% 99% 97% 100%    General: Pt is alert, awake, not in acute distress Cardiovascular: RRR, S1/S2 +, no rubs, no gallops Respiratory: CTA bilaterally, no wheezing, no rhonchi Abdominal: Soft, NT, ND, bowel sounds + Extremities: no edema, no cyanosis    The results of significant diagnostics from this hospitalization (including imaging, microbiology, ancillary and laboratory) are listed below for reference.     Microbiology: No results found for this or any previous visit (from the past 240 hour(s)).   Labs: BNP (last 3 results) No results for input(s): BNP in the last 8760 hours. Basic Metabolic Panel:  Recent Labs  Lab 07/06/16 2209 07/07/16 0130 07/07/16 0644 07/07/16 1251 07/08/16 0602 07/09/16 0543  NA 133* 137 137  --  138 138  K 5.3* 3.8 3.4*  --  3.3* 3.6  CL 99* 103 101  --  104 104  CO2 24 28 28   --  27 28  GLUCOSE 622* 321* 123* 509* 368* 154*  BUN 32* 30* 28*  --  22* 14  CREATININE 1.56* 1.45* 1.17*  --  1.00 0.84  CALCIUM 9.2 9.3 9.3  --  9.0 9.3  MG 2.1  --   --   --   --  1.9   Liver Function Tests:  Recent Labs Lab 07/06/16 1852  AST 21  ALT 33  ALKPHOS 127*  BILITOT 0.8  PROT 7.8  ALBUMIN 4.4   No results for input(s): LIPASE, AMYLASE in the last 168 hours. No results for input(s): AMMONIA in the last 168 hours. CBC:  Recent Labs Lab 07/06/16 1852  WBC 7.4  HGB 12.1  HCT 37.8  MCV 73.7*  PLT 199   Cardiac Enzymes: No results for input(s): CKTOTAL, CKMB, CKMBINDEX, TROPONINI in the last 168 hours. BNP: Invalid input(s): POCBNP CBG:  Recent Labs Lab 07/08/16 1538 07/08/16 2004 07/09/16 0003 07/09/16 0411  07/09/16 0751  GLUCAP 292* 385* 285* 145* 193*   D-Dimer No results for input(s): DDIMER in the last 72 hours. Hgb A1c No results for input(s): HGBA1C in the last 72 hours. Lipid Profile No results for input(s): CHOL, HDL, LDLCALC, TRIG, CHOLHDL, LDLDIRECT in the last 72 hours. Thyroid function studies No results for input(s): TSH, T4TOTAL, T3FREE, THYROIDAB in the last 72 hours.  Invalid input(s): FREET3 Anemia work up No results for input(s): VITAMINB12, FOLATE, FERRITIN, TIBC, IRON, RETICCTPCT in the last 72 hours. Urinalysis    Component Value Date/Time   COLORURINE YELLOW 07/06/2016 1951   APPEARANCEUR CLEAR 07/06/2016 1951   LABSPEC 1.022 07/06/2016 1951   PHURINE 5.0 07/06/2016 1951   GLUCOSEU >=500 (A) 07/06/2016 1951   GLUCOSEU NEGATIVE 09/16/2015 [place] 07/06/2016 [place] NEGATIVE 07/06/2016 1951   KETONESUR 5 (A) 07/06/2016 1951   PROTEINUR NEGATIVE 07/06/2016 1951   UROBILINOGEN 0.2 09/16/2015 1037   NITRITE NEGATIVE 07/06/2016 1951   LEUKOCYTESUR TRACE (A) 07/06/2016 1951   Sepsis Labs Invalid input(s): PROCALCITONIN,  WBC,  LACTICIDVEN Microbiology No results found for this or any previous visit (from the past 240 hour(s)).   Time coordinating discharge: 45 minutes  SIGNED:   [name], MD  [facility] 07/09/2016, 10:54 AM Pager   If 7PM-7AM, please contact night-coverage [email or web address] Password [email or web address]

## 2016-07-09 NOTE — Care Management Note (Signed)
Case Management Note  Patient Details  Name: Deborah Neal MRN: [id number] Date of Birth: 02/02/1951  Subjective/Objective:      hyperglycemia              Action/Plan:  From home, discussed glucometer cost and where to get for less than 20.00-[employer]/already gets her meds at [facility].  No other needs present. Suanne Marker [id number],[id number]/[phone]   Expected Discharge Date:  07/09/16               Expected Discharge Plan:  Home/Self Care  In-House Referral:     Discharge planning Services  CM Consult  Post Acute Care Choice:    Choice offered to:     DME Arranged:    DME Agency:     HH Arranged:    HH Agency:     Status of Service:     If discussed at [employer], dates discussed:    Additional Comments:  Leeroy Cha, RN 07/09/2016, 11:28 AM

## 2016-07-09 NOTE — Progress Notes (Signed)
Patient discharge home. Stable. Discharge instructions given. She was taught with subcutaneous injection. Patient successfully administered the lunchtime insulin coverage to R lower abdomen. Also she performed pulling the right insulin amount from the bottle. Care notes on DM and nutrition, how to give subcutaneous insulin injections, DM and sick day management were provided, daughter and patient verbalized understanding. Prescription also given. All questions were answered appropriately by this Probation officer.

## 2016-07-09 NOTE — Progress Notes (Signed)
Nutrition Education Note  RD consulted for nutrition education regarding diabetes.   Lab Results  Component Value Date   HGBA1C 7.7 (H) 03/23/2016    RD provided "Nutrition with Type II Diabetes" handout from the Academy of [employer] Dietetics. Discussed different food groups and their effects on blood sugar, emphasizing carbohydrate-containing foods. Provided list of carbohydrates and recommended serving sizes of common foods.  Discussed importance of controlled and consistent carbohydrate intake throughout the day. Provided examples of ways to balance meals/snacks and encouraged intake of high-fiber, whole grain complex carbohydrates. Teach back method used.  Expect fair compliance.  Current diet order is Low sodium, patient is consuming approximately 100% of meals at this time. Labs and medications reviewed. No further nutrition interventions warranted at this time. RD contact information provided. If additional nutrition issues arise, please re-consult RD.  Koleen Distance, RD, LDN Pager #- [phone]

## 2016-07-13 ENCOUNTER — Encounter: Payer: Self-pay | Admitting: Internal Medicine

## 2016-07-13 ENCOUNTER — Ambulatory Visit (INDEPENDENT_AMBULATORY_CARE_PROVIDER_SITE_OTHER): Payer: BC Managed Care – PPO | Admitting: Internal Medicine

## 2016-07-13 ENCOUNTER — Telehealth: Payer: Self-pay

## 2016-07-13 ENCOUNTER — Emergency Department (HOSPITAL_COMMUNITY)
Admission: EM | Admit: 2016-07-13 | Discharge: 2016-07-14 | Disposition: A | Discharge status: Home / Self Care | Payer: BC Managed Care – PPO | Attending: Emergency Medicine | Admitting: Emergency Medicine

## 2016-07-13 ENCOUNTER — Encounter (HOSPITAL_COMMUNITY): Payer: Self-pay

## 2016-07-13 VITALS — BP 116/72 | HR 89 | Temp 99.2°F | Ht 64.0 in | Wt 170.0 lb

## 2016-07-13 DIAGNOSIS — Z7982 Long term (current) use of aspirin: Secondary | ICD-10-CM | POA: Diagnosis not present

## 2016-07-13 DIAGNOSIS — E2839 Other primary ovarian failure: Secondary | ICD-10-CM

## 2016-07-13 DIAGNOSIS — J45909 Unspecified asthma, uncomplicated: Secondary | ICD-10-CM | POA: Diagnosis not present

## 2016-07-13 DIAGNOSIS — I10 Essential (primary) hypertension: Secondary | ICD-10-CM | POA: Diagnosis not present

## 2016-07-13 DIAGNOSIS — E1165 Type 2 diabetes mellitus with hyperglycemia: Secondary | ICD-10-CM | POA: Insufficient documentation

## 2016-07-13 DIAGNOSIS — M8580 Other specified disorders of bone density and structure, unspecified site: Secondary | ICD-10-CM

## 2016-07-13 DIAGNOSIS — Z87891 Personal history of nicotine dependence: Secondary | ICD-10-CM | POA: Insufficient documentation

## 2016-07-13 DIAGNOSIS — Z794 Long term (current) use of insulin: Secondary | ICD-10-CM | POA: Diagnosis not present

## 2016-07-13 DIAGNOSIS — R739 Hyperglycemia, unspecified: Secondary | ICD-10-CM

## 2016-07-13 DIAGNOSIS — E119 Type 2 diabetes mellitus without complications: Secondary | ICD-10-CM

## 2016-07-13 LAB — BASIC METABOLIC PANEL
Anion gap: 10 (ref 5–15)
BUN: 25 mg/dL — AB (ref 6–20)
CHLORIDE: 98 mmol/L — AB (ref 101–111)
CO2: 26 mmol/L (ref 22–32)
CREATININE: 1.28 mg/dL — AB (ref 0.44–1.00)
Calcium: 9.7 mg/dL (ref 8.9–10.3)
GFR, EST AFRICAN AMERICAN: 50 mL/min — AB (ref >60)
GFR, EST NON AFRICAN AMERICAN: 43 mL/min — AB (ref >60)
Glucose, Bld: 496 mg/dL — ABNORMAL HIGH (ref 65–99)
POTASSIUM: 4.4 mmol/L (ref 3.5–5.1)
SODIUM: 134 mmol/L — AB (ref 135–145)

## 2016-07-13 LAB — CBG MONITORING, ED
GLUCOSE-CAPILLARY: 490 mg/dL — AB (ref 65–99)
GLUCOSE-CAPILLARY: 325 mg/dL — AB (ref 65–99)
GLUCOSE-CAPILLARY: 237 mg/dL — AB (ref 65–99)

## 2016-07-13 LAB — CBC WITH DIFFERENTIAL/PLATELET
BASOS PCT: 0 %
Basophils Absolute: 0 10*3/uL (ref 0.0–0.1)
EOS ABS: 0 10*3/uL (ref 0.0–0.7)
EOS PCT: 0 %
HCT: 35.1 % — ABNORMAL LOW (ref 36.0–46.0)
HEMOGLOBIN: 11.2 g/dL — AB (ref 12.0–15.0)
Lymphocytes Relative: 24 %
Lymphs Abs: 2.2 10*3/uL (ref 0.7–4.0)
MCH: 23.4 pg — AB (ref 26.0–34.0)
MCHC: 31.9 g/dL (ref 30.0–36.0)
MCV: 73.4 fL — ABNORMAL LOW (ref 78.0–100.0)
Monocytes Absolute: 0.7 10*3/uL (ref 0.1–1.0)
Monocytes Relative: 8 %
NEUTROS PCT: 68 %
Neutro Abs: 6.1 10*3/uL (ref 1.7–7.7)
PLATELETS: 218 10*3/uL (ref 150–400)
RBC: 4.78 MIL/uL (ref 3.87–5.11)
RDW: 14.5 % (ref 11.5–15.5)
WBC: 9 10*3/uL (ref 4.0–10.5)

## 2016-07-13 LAB — BLOOD GAS, VENOUS
Acid-Base Excess: 3 mmol/L — ABNORMAL HIGH (ref 0.0–2.0)
BICARBONATE: 27.3 mmol/L (ref 20.0–28.0)
O2 SAT: 78.9 %
PATIENT TEMPERATURE: 98.6
PO2 VEN: 44.6 mmHg (ref 32.0–45.0)
pCO2, Ven: 42.5 mmHg — ABNORMAL LOW (ref 44.0–60.0)
pH, Ven: 7.423 (ref 7.250–7.430)

## 2016-07-13 MED ORDER — INSULIN ASPART 100 UNIT/ML ~~LOC~~ SOLN
0.0000 [IU] | Freq: Three times a day (TID) | SUBCUTANEOUS | 11 refills | Status: DC
Start: 2016-07-13 — End: 2016-07-27

## 2016-07-13 MED ORDER — INSULIN ASPART 100 UNIT/ML ~~LOC~~ SOLN
8.0000 [IU] | Freq: Once | SUBCUTANEOUS | Status: AC
  Administered 2016-07-13: 8 [IU] via SUBCUTANEOUS
  Filled 2016-07-13: qty 1

## 2016-07-13 MED ORDER — SODIUM CHLORIDE 0.9 % IV BOLUS (SEPSIS)
1000.0000 mL | Freq: Once | INTRAVENOUS | Status: AC
  Administered 2016-07-13: 1000 mL via INTRAVENOUS

## 2016-07-13 MED ORDER — SODIUM CHLORIDE 0.9 % IV SOLN: INTRAVENOUS | Status: DC

## 2016-07-13 MED ORDER — INSULIN GLARGINE 100 UNIT/ML ~~LOC~~ SOLN: 15.0000 [IU] | Freq: Two times a day (BID) | SUBCUTANEOUS | 11 refills | Status: DC

## 2016-07-13 MED ORDER — SODIUM CHLORIDE 0.9 % IV BOLUS (SEPSIS)
1000.0000 mL | Freq: Once | INTRAVENOUS | Status: AC
Start: 2016-07-13 — End: 2016-07-13
  Administered 2016-07-13: 1000 mL via INTRAVENOUS

## 2016-07-13 MED ORDER — INSULIN ASPART 100 UNIT/ML ~~LOC~~ SOLN
12.0000 [IU] | Freq: Once | SUBCUTANEOUS | Status: AC
  Administered 2016-07-13: 12 [IU] via SUBCUTANEOUS
  Filled 2016-07-13: qty 1

## 2016-07-13 NOTE — ED Notes (Signed)
Bed: WA03 Expected date:  Expected time:  Means of arrival:  Comments: Clean for triage

## 2016-07-13 NOTE — Assessment & Plan Note (Signed)
stable overall by history and exam, recent data reviewed with pt, and pt to continue medical treatment as before,  to f/u any worsening symptoms or concerns BP Readings from Last 3 Encounters:  07/13/16 116/72  07/09/16 (!) 140/91  05/22/16 134/82

## 2016-07-13 NOTE — ED Provider Notes (Signed)
[facility] DEPT Provider Note   CSN: [id number] Arrival date & time: 07/13/16  1542     History   Chief Complaint Chief Complaint  Patient presents with  . Hyperglycemia    HPI Deborah Neal is a 66 y.o. female.  HPI Patient states that she was treated in the hospital a week ago for hyperglycemia. She was going to her doctor for a follow-up appointment today. She reports blood sugars have been running sometimes up to 300-400. She however reports that she was not having any symptoms. She states when she went to her doctor today she erroneously was given a steroid shot. Her doctor referred her to come to the emergency department for treatment. She reports that her blood sugar went up to 600. She still denies any associated symptoms. Past Medical History:  Diagnosis Date  . ALLERGIC RHINITIS 01/29/2007  . Arthritis   . ASTHMA 01/29/2007  . ASTHMA, WITH ACUTE EXACERBATION 06/24/2008  . BACK PAIN 01/29/2009  . CHEST PAIN 06/24/2008  . Family history of adverse reaction to anesthesia    Patients daughter has N/V after anesthesia  . GERD (gastroesophageal reflux disease)   . History of shingles   . HYPERLIPIDEMIA 01/29/2007  . HYPERTENSION 01/29/2007  . OSTEOPENIA 10/16/2007  . Type II or unspecified type diabetes mellitus without mention of complication, uncontrolled 11/29/2013    Patient Active Problem List   Diagnosis Date Noted  . Dehydration   . Hyperglycemic crisis in diabetes mellitus ([place]) 07/06/2016  . AKI (acute kidney injury) ([place]) 07/06/2016  . Hyperkalemia 07/06/2016  . S/P laparoscopic cholecystectomy 09/19/2015  . Abnormal chest CT 06/21/2014  . Diabetes mellitus type II, non insulin dependent ([place]) 11/29/2013  . Obesity, unspecified 09/19/2012  . Plantar fasciitis, bilateral 06/08/2011  . BACK PAIN 01/29/2009  . Osteopenia 10/16/2007  . Essential hypertension 01/29/2007  . Asthma 01/29/2007    Past Surgical History:  Procedure Laterality Date  .  CHOLECYSTECTOMY N/A 09/19/2015   Procedure: LAPAROSCOPIC CHOLECYSTECTOMY WITH INTRAOPERATIVE CHOLANGIOGRAM;  Surgeon: Jackolyn Confer, MD;  Location: [facility];  Service: General;  Laterality: N/A;  . COLONOSCOPY    . TONSILLECTOMY    . VIDEO BRONCHOSCOPY Bilateral 03/28/2014   Procedure: VIDEO BRONCHOSCOPY WITHOUT FLUORO;  Surgeon: Tanda Rockers, MD;  Location: [facility];  Service: Cardiopulmonary;  Laterality: Bilateral;    OB History    Gravida Para Term Preterm AB Living   5 5 5     5    SAB TAB Ectopic Multiple Live Births           5       Home Medications    Prior to Admission medications   Medication Sig Start Date End Date Taking? Authorizing Provider  albuterol (PROVENTIL HFA;VENTOLIN HFA) 108 (90 Base) MCG/ACT inhaler Inhale 2 puffs into the lungs every 6 (six) hours as needed for wheezing or shortness of breath. 05/22/16  Yes Brunetta Jeans, PA-C  alendronate (FOSAMAX) 70 MG tablet TAKE ONE TABLET BY MOUTH ONCE EVERY 7 DAYS. TAKE  WITH  A  FULL  GLASS  OF  WATER  AND  ON  AN  EMPTY  STOMACH 02/18/15  Yes Biagio Borg, MD  amLODipine-benazepril (LOTREL) 5-20 MG capsule TAKE TWO CAPSULES BY MOUTH ONCE DAILY 06/01/16  Yes Biagio Borg, MD  aspirin 81 MG tablet Take 81 mg by mouth every morning.    Yes Historical Provider, MD  ferrous sulfate 325 (65 FE) MG tablet Take 325 mg by mouth daily  with breakfast.   Yes Historical Provider, MD  hydrochlorothiazide (HYDRODIURIL) 25 MG tablet TAKE ONE TABLET BY MOUTH ONCE DAILY 06/01/16  Yes Biagio Borg, MD  insulin aspart (NOVOLOG) 100 UNIT/ML injection Inject 0-15 Units into the skin 3 (three) times daily before meals. Glucose 150-200 use 2 units, 201-250 use 4 units, 251 to 300 use 6 units, 301-350 use 8 units, 351 or greater use 10 units. 07/13/16  Yes Biagio Borg, MD  Multiple Vitamin (MULTIVITAMIN WITH MINERALS) TABS tablet Take 1 tablet by mouth daily.   Yes Historical Provider, MD  potassium chloride (K-DUR) 10 MEQ tablet Take 3 tablets  (30 mEq total) by mouth daily. 03/26/16  Yes Biagio Borg, MD  rosuvastatin (CRESTOR) 20 MG tablet TAKE ONE TABLET BY MOUTH ONCE DAILY 06/01/16  Yes Biagio Borg, MD  benzonatate (TESSALON) 100 MG capsule Take 1 capsule (100 mg total) by mouth 2 (two) times daily as needed for cough. 05/22/16   Brunetta Jeans, PA-C  cyclobenzaprine (FLEXERIL) 5 MG tablet 1-2 qhs prn Patient taking differently: Take 5-10 mg by mouth at bedtime as needed for muscle spasms.  06/21/14   Hendricks Limes, MD  insulin glargine (LANTUS) 100 UNIT/ML injection Inject 0.15 mLs (15 Units total) into the skin 2 (two) times daily. 07/13/16   Biagio Borg, MD  ondansetron (ZOFRAN) 4 MG tablet Take 1 tablet (4 mg total) by mouth every 4 (four) hours as needed for nausea or vomiting. Patient not taking: Reported on 07/13/2016 09/19/15   Jackolyn Confer, MD    Family History Family History  Problem Relation Age of Onset  . Cancer Mother     ? type   . Diabetes Father   . Hypertension Father   . Heart disease Father   . Asthma Father   . Asthma Daughter     Social History Social History  Substance Use Topics  . Smoking status: Former Smoker    Quit date: 09/07/2003  . Smokeless tobacco: Never Used  . Alcohol use No     Allergies   Patient has no known allergies.   Review of Systems Review of Systems 10 Systems reviewed and are negative for acute change except as noted in the HPI.   Physical Exam Updated Vital Signs BP 132/68 (BP Location: Left Arm)   Pulse 75   Temp 97.7 F (36.5 C) (Oral)   Resp 20   Ht 5\' 4"  (1.626 m)   Wt 170 lb (77.1 kg)   SpO2 96%   BMI 29.18 kg/m   Physical Exam  Constitutional: She is oriented to person, place, and time.  The patient is alert and nontoxic. No respiratory distress. Central obesity.  HENT:  Head: Normocephalic and atraumatic.  Mouth/Throat: Oropharynx is clear and moist.  Eyes: EOM are normal. Pupils are equal, round, and reactive to light.  Cardiovascular:  Normal rate, regular rhythm, normal heart sounds and intact distal pulses.   Pulmonary/Chest: Effort normal and breath sounds normal.  Abdominal: Soft. She exhibits no distension. There is no tenderness.  Musculoskeletal: Normal range of motion. She exhibits no edema, tenderness or deformity.  Neurological: She is alert and oriented to person, place, and time. No cranial nerve deficit. She exhibits normal muscle tone. Coordination normal.  Skin: Skin is warm and dry.  Psychiatric: She has a normal mood and affect.     ED Treatments / Results  Labs (all labs ordered are listed, but only abnormal results are displayed) Labs Reviewed  CBC WITH DIFFERENTIAL/PLATELET - Abnormal; Notable for the following:       Result Value   Hemoglobin 11.2 (*)    HCT 35.1 (*)    MCV 73.4 (*)    MCH 23.4 (*)    All other components within normal limits  BASIC METABOLIC PANEL - Abnormal; Notable for the following:    Sodium 134 (*)    Chloride 98 (*)    Glucose, Bld 496 (*)    BUN 25 (*)    Creatinine, Ser 1.28 (*)    GFR calc non Af Amer 43 (*)    GFR calc Af Amer 50 (*)    All other components within normal limits  BLOOD GAS, VENOUS - Abnormal; Notable for the following:    pCO2, Ven 42.5 (*)    Acid-Base Excess 3.0 (*)    All other components within normal limits  CBG MONITORING, ED - Abnormal; Notable for the following:    Glucose-Capillary 490 (*)    All other components within normal limits  CBG MONITORING, ED - Abnormal; Notable for the following:    Glucose-Capillary 325 (*)    All other components within normal limits  CBG MONITORING, ED - Abnormal; Notable for the following:    Glucose-Capillary 237 (*)    All other components within normal limits    EKG  EKG Interpretation None       Radiology No results found.  Procedures Procedures (including critical care time)  Medications Ordered in ED Medications  0.9 %  sodium chloride infusion (not administered)  sodium  chloride 0.9 % bolus 1,000 mL (0 mLs Intravenous Stopped 07/13/16 2039)  insulin aspart (novoLOG) injection 12 Units (12 Units Subcutaneous Given 07/13/16 1936)  sodium chloride 0.9 % bolus 1,000 mL (0 mLs Intravenous Stopped 07/13/16 2312)  insulin aspart (novoLOG) injection 8 Units (8 Units Subcutaneous Given 07/13/16 2209)     Initial Impression / Assessment and Plan / ED Course  I have reviewed the triage vital signs and the nursing notes.  Pertinent labs & imaging results that were available during my care of the patient were reviewed by me and considered in my medical decision making (see chart for details).       Final Clinical Impressions(s) / ED Diagnoses   Final diagnoses:  Hyperglycemia   Patient is treated with fluid hydration and administration of subcutaneous insulin. She has responded well and blood sugars down to 230. She will resume her nighttime dose of Lantus and monitor her blood sugars tomorrow resuming her sliding scale. New Prescriptions New Prescriptions   No medications on file     Charlesetta Shanks, MD 07/13/16 [phone]

## 2016-07-13 NOTE — Assessment & Plan Note (Signed)
Improved, but for some reason not taking the lantus (taking SSI only), with sugars in the 400's, and then inadvertently given depomedrol 80 mg IM; pt is instructed to start lantus asap, cont Novolog SSI, cont diet and monitor CBGS and call for > 300

## 2016-07-13 NOTE — Progress Notes (Signed)
Pre visit review using our clinic review tool, if applicable. No additional management support is needed unless otherwise documented below in the visit note.   Patient was mistakenly given Depo-80 per instruction of [name] to go to room #1.

## 2016-07-13 NOTE — Patient Instructions (Addendum)
OK to stop the fosamax  Please continue all other medications as before, and refills have been done if requested - including both of the insulins  Please continue to monitor your sugars on the insulin, follow the Diabetic diet as best you can, and call if the sugars are still more than 300  Please have the pharmacy call with any other refills you may need.  Please continue your efforts at being more active, low cholesterol diabetic diet, and weight control.  Please keep your appointments with your specialists as you may have planned  Please schedule the bone density test before leaving today at the scheduling desk (where you check out)  Please return in 2 weeks, or sooner if needed

## 2016-07-13 NOTE — Discharge Instructions (Signed)
Take your nighttime Lantus dose when you get home. Resume your usual insulin dosing tomorrow morning with your a.m. Lantus and your sliding scale insulin. Monitor your blood sugar 3 times a day and keep a log.

## 2016-07-13 NOTE — Assessment & Plan Note (Signed)
Ok to d/c the fosamax, and will check dxa as she is due

## 2016-07-13 NOTE — ED Notes (Signed)
Pt ambulatory and independent at discharge.  Verbalized understanding of discharge instructions 

## 2016-07-13 NOTE — ED Notes (Signed)
Bed: WA03 Expected date:  Expected time:  Means of arrival:  Comments: Hold for triage  

## 2016-07-13 NOTE — Telephone Encounter (Signed)
Patient came in today for office visit with dr john---dr Jenny Reichmann gave verbal order to his assistant, shirron to give depo 80 to room 1---this patient was located in room 1, however, the injection should not have gone to this patient, but to patient in room 2---depo 80 was administered in error to this patient---dr Jenny Reichmann advised patient before she left that she received this injection in error---after patient returned home, patient's daughter, patricia called our office at appx 1300 to report a finger stick glucose reading of over 600---dr Jenny Reichmann was advised and dr Jenny Reichmann gave order for patient to take 14 units novolog now and to recheck finger stick at 1600 to see if glucose reading has decreased---our office called patient's daughter, Mardene Celeste back at 1545 and patient's finger stick glucose reading remained over 600---dr Jenny Reichmann was advised and he gave order for patient to go to ED---daughter stated she would carry patient to [facility]---patient remained without confusion, irritability, sweating, no chest pain or shortness of breath

## 2016-07-13 NOTE — ED Notes (Signed)
Bed: [facility] Expected date:  Expected time:  Means of arrival:  Comments: 

## 2016-07-13 NOTE — ED Triage Notes (Addendum)
Patient states her CBG at home was >600. Patient took 14 units of regular insulin at 1300. CBG in triage-490. Patient denies N/V or abdominal pain.  Patient states she went to see her PCP today for a check up and was given a steroid shot and caused her BS to go up.

## 2016-07-13 NOTE — Progress Notes (Signed)
Subjective:    Patient ID: Deborah Neal, female    DOB: [birth year], 66 y.o.   MRN: [id number]  HPI  Here to f/u recent hospn 3/13 to 3/16 with DKA, DM, HTN, asthma improved with insulin tx, metformin stopped. .   Pt denies polydipsia, polyuria, or low sugar symptoms such as weakness or confusion, but sugars at home still ranging in the 400's.  Pt states overall good compliance with meds, trying to follow lower cholesterol, diabetic diet, but on med review she is not taking the Lantus, and only taking the SSI.  Pt states rx was not sent to pharmacy, but I see on record that it seemed to have been prescribed correctly.  Pt denies chest pain, increased sob or doe, wheezing, orthopnea, PND, increased LE swelling, palpitations, dizziness or syncope.    Of note, pt was inadvertently given Depomedrol IM 80 mg through some miscommunication between myself and my new nurse on her first day and first 2 patients.   Also noted, pt has been on fosamax 5 or more yrs, wondering if she needs to continue this Past Medical History:  Diagnosis Date  . ALLERGIC RHINITIS 01/29/2007  . Arthritis   . ASTHMA 01/29/2007  . ASTHMA, WITH ACUTE EXACERBATION 06/24/2008  . BACK PAIN 01/29/2009  . CHEST PAIN 06/24/2008  . Family history of adverse reaction to anesthesia    Patients daughter has N/V after anesthesia  . GERD (gastroesophageal reflux disease)   . History of shingles   . HYPERLIPIDEMIA 01/29/2007  . HYPERTENSION 01/29/2007  . OSTEOPENIA 10/16/2007  . Type II or unspecified type diabetes mellitus without mention of complication, uncontrolled 11/29/2013   Past Surgical History:  Procedure Laterality Date  . CHOLECYSTECTOMY N/A 09/19/2015   Procedure: LAPAROSCOPIC CHOLECYSTECTOMY WITH INTRAOPERATIVE CHOLANGIOGRAM;  Surgeon: Jackolyn Confer, MD;  Location: [facility];  Service: General;  Laterality: N/A;  . COLONOSCOPY    . TONSILLECTOMY    . VIDEO BRONCHOSCOPY Bilateral 03/28/2014   Procedure: VIDEO BRONCHOSCOPY WITHOUT  FLUORO;  Surgeon: Tanda Rockers, MD;  Location: [facility];  Service: Cardiopulmonary;  Laterality: Bilateral;    reports that she quit smoking about 12 years ago. She has never used smokeless tobacco. She reports that she does not drink alcohol or use drugs. family history includes Asthma in her daughter and father; Cancer in her mother; Diabetes in her father; Heart disease in her father; Hypertension in her father. No Known Allergies Current Outpatient Prescriptions on File Prior to Visit  Medication Sig Dispense Refill  . albuterol (PROVENTIL HFA;VENTOLIN HFA) 108 (90 Base) MCG/ACT inhaler Inhale 2 puffs into the lungs every 6 (six) hours as needed for wheezing or shortness of breath. 6.7 g 0  . alendronate (FOSAMAX) 70 MG tablet TAKE ONE TABLET BY MOUTH ONCE EVERY 7 DAYS. TAKE  WITH  A  FULL  GLASS  OF  WATER  AND  ON  AN  EMPTY  STOMACH 12 tablet 1  . amLODipine-benazepril (LOTREL) 5-20 MG capsule TAKE TWO CAPSULES BY MOUTH ONCE DAILY 180 capsule 2  . aspirin 81 MG tablet Take 81 mg by mouth every morning.     . benzonatate (TESSALON) 100 MG capsule Take 1 capsule (100 mg total) by mouth 2 (two) times daily as needed for cough. 20 capsule 0  . cyclobenzaprine (FLEXERIL) 5 MG tablet 1-2 qhs prn (Patient taking differently: Take 5-10 mg by mouth at bedtime as needed for muscle spasms. ) 14 tablet 0  . ferrous sulfate 325 (65  FE) MG tablet Take 325 mg by mouth daily with breakfast.    . hydrochlorothiazide (HYDRODIURIL) 25 MG tablet TAKE ONE TABLET BY MOUTH ONCE DAILY 90 tablet 2  . Multiple Vitamin (MULTIVITAMIN WITH MINERALS) TABS tablet Take 1 tablet by mouth daily.    . ondansetron (ZOFRAN) 4 MG tablet Take 1 tablet (4 mg total) by mouth every 4 (four) hours as needed for nausea or vomiting. 20 tablet 0  . potassium chloride (K-DUR) 10 MEQ tablet Take 3 tablets (30 mEq total) by mouth daily. 270 tablet 3  . rosuvastatin (CRESTOR) 20 MG tablet TAKE ONE TABLET BY MOUTH ONCE DAILY 90 tablet  2   No current facility-administered medications on file prior to visit.    Review of Systems  Constitutional: Negative for unusual diaphoresis or night sweats HENT: Negative for ear swelling or discharge Eyes: Negative for worsening visual haziness  Respiratory: Negative for choking and stridor.   Gastrointestinal: Negative for distension or worsening eructation Genitourinary: Negative for retention or change in urine volume.  Musculoskeletal: Negative for other MSK pain or swelling Skin: Negative for color change and worsening wound Neurological: Negative for tremors and numbness other than noted  Psychiatric/Behavioral: Negative for decreased concentration or agitation other than above   All other system neg per pt    Objective:   Physical Exam BP 116/72   Pulse 89   Temp 99.2 F (37.3 C) (Oral)   Ht 5\' 4"  (1.626 m)   Wt 170 lb (77.1 kg)   SpO2 96%   BMI 29.18 kg/m  VS noted,  Constitutional: Pt appears in no apparent distress HENT: Head: NCAT.  Right Ear: External ear normal.  Left Ear: External ear normal.  Eyes: . Pupils are equal, round, and reactive to light. Conjunctivae and EOM are normal Neck: Normal range of motion. Neck supple.  Cardiovascular: Normal rate and regular rhythm.   Pulmonary/Chest: Effort normal and breath sounds without rales or wheezing.  Abd:  Soft, NT, ND, + BS Neurological: Pt is alert. Not confused , motor grossly intact Skin: Skin is warm. No rash, no LE edema Psychiatric: Pt behavior is normal. No agitation.     Assessment & Plan:

## 2016-07-14 ENCOUNTER — Telehealth: Payer: Self-pay

## 2016-07-14 MED ORDER — INSULIN DETEMIR 100 UNIT/ML FLEXPEN: 15.0000 [IU] | PEN_INJECTOR | Freq: Two times a day (BID) | SUBCUTANEOUS | 11 refills | Status: DC

## 2016-07-14 NOTE — Telephone Encounter (Signed)
[facility] for change lantus to levemir - done erx

## 2016-07-14 NOTE — Telephone Encounter (Signed)
Per pharmacy Lantus is not covered. Insurance prefers [employer], [employer], [employer] or [place].

## 2016-07-27 ENCOUNTER — Ambulatory Visit (INDEPENDENT_AMBULATORY_CARE_PROVIDER_SITE_OTHER): Payer: BC Managed Care – PPO | Admitting: Internal Medicine

## 2016-07-27 ENCOUNTER — Encounter: Payer: Self-pay | Admitting: Internal Medicine

## 2016-07-27 ENCOUNTER — Ambulatory Visit (INDEPENDENT_AMBULATORY_CARE_PROVIDER_SITE_OTHER)
Admission: RE | Admit: 2016-07-27 | Discharge: 2016-07-27 | Disposition: A | Discharge status: Home / Self Care | Payer: BC Managed Care – PPO | Source: Ambulatory Visit

## 2016-07-27 VITALS — BP 100/64 | HR 82 | Temp 98.6°F | Wt 175.0 lb

## 2016-07-27 DIAGNOSIS — J4520 Mild intermittent asthma, uncomplicated: Secondary | ICD-10-CM | POA: Diagnosis not present

## 2016-07-27 DIAGNOSIS — E2839 Other primary ovarian failure: Secondary | ICD-10-CM | POA: Diagnosis not present

## 2016-07-27 DIAGNOSIS — E119 Type 2 diabetes mellitus without complications: Secondary | ICD-10-CM

## 2016-07-27 DIAGNOSIS — I10 Essential (primary) hypertension: Secondary | ICD-10-CM | POA: Diagnosis not present

## 2016-07-27 MED ORDER — LANCETS MISC: 11 refills | Status: DC

## 2016-07-27 MED ORDER — GLUCOSE BLOOD VI STRP: ORAL_STRIP | 12 refills | Status: DC

## 2016-07-27 MED ORDER — ONETOUCH ULTRA 2 W/DEVICE KIT: PACK | 0 refills | Status: DC

## 2016-07-27 MED ORDER — INSULIN NPH ISOPHANE & REGULAR (70-30) 100 UNIT/ML ~~LOC~~ SUSP: 25.0000 [IU] | Freq: Two times a day (BID) | SUBCUTANEOUS | 11 refills | Status: DC

## 2016-07-27 NOTE — Patient Instructions (Signed)
OK to stop the levemir pen and the novolog sliding scale insulin  Please take all new medication as prescribed - the 70/30 Relion insulin at 25 units twice per day  You also had a meter and supplies sent to the pharmacy  Please continue to monitor your blood sugars up to 4 times per day, and call in 2-3 days if the sugars are still more than 300  Please continue all other medications as before, and refills have been done if requested.  Please have the pharmacy call with any other refills you may need.  Please keep your appointments with your specialists as you may have planned  Please return in 3 weeks, or sooner if needed

## 2016-07-27 NOTE — Progress Notes (Signed)
Pre visit review using our clinic review tool, if applicable. No additional management support is needed unless otherwise documented below in the visit note. 

## 2016-07-27 NOTE — Progress Notes (Signed)
Subjective:    Patient ID: Deborah Neal, female    DOB: Nov 27, 1950, 66 y.o.   MRN: [id number]  HPI  Here to f/u; overall doing ok,  Pt denies chest pain, increasing sob or doe, wheezing, orthopnea, PND, increased LE swelling, palpitations, dizziness or syncope.  Pt denies new neurological symptoms such as new headache, or facial or extremity weakness or numbness.  Pt denies polydipsia, polyuria, or low sugar episode.   Pt denies new neurological symptoms such as new headache, or facial or extremity weakness or numbness.   Pt states overall good compliance with meds, mostly trying to follow appropriate diet, with wt overall stable,  Still finds levemir to be expensive and not really trying to use as directed.  CBG's in the AM are 300-500 still, later in the day is less than that.  Levemir is simply too expensive and cannot afford BP Readings from Last 3 Encounters:  07/27/16 100/64  07/13/16 (!) 108/59  07/13/16 116/72   Past Medical History:  Diagnosis Date  . ALLERGIC RHINITIS 01/29/2007  . Arthritis   . ASTHMA 01/29/2007  . ASTHMA, WITH ACUTE EXACERBATION 06/24/2008  . BACK PAIN 01/29/2009  . CHEST PAIN 06/24/2008  . Family history of adverse reaction to anesthesia    Patients daughter has N/V after anesthesia  . GERD (gastroesophageal reflux disease)   . History of shingles   . HYPERLIPIDEMIA 01/29/2007  . HYPERTENSION 01/29/2007  . OSTEOPENIA 10/16/2007  . Type II or unspecified type diabetes mellitus without mention of complication, uncontrolled 11/29/2013   Past Surgical History:  Procedure Laterality Date  . CHOLECYSTECTOMY N/A 09/19/2015   Procedure: LAPAROSCOPIC CHOLECYSTECTOMY WITH INTRAOPERATIVE CHOLANGIOGRAM;  Surgeon: Jackolyn Confer, MD;  Location: [place];  Service: General;  Laterality: N/A;  . COLONOSCOPY    . TONSILLECTOMY    . VIDEO BRONCHOSCOPY Bilateral 03/28/2014   Procedure: VIDEO BRONCHOSCOPY WITHOUT FLUORO;  Surgeon: Tanda Rockers, MD;  Location: [facility];  Service:  Cardiopulmonary;  Laterality: Bilateral;    reports that she quit smoking about 12 years ago. She has never used smokeless tobacco. She reports that she does not drink alcohol or use drugs. family history includes Asthma in her daughter and father; Cancer in her mother; Diabetes in her father; Heart disease in her father; Hypertension in her father. No Known Allergies Current Outpatient Prescriptions on File Prior to Visit  Medication Sig Dispense Refill  . albuterol (PROVENTIL HFA;VENTOLIN HFA) 108 (90 Base) MCG/ACT inhaler Inhale 2 puffs into the lungs every 6 (six) hours as needed for wheezing or shortness of breath. 6.7 g 0  . alendronate (FOSAMAX) 70 MG tablet TAKE ONE TABLET BY MOUTH ONCE EVERY 7 DAYS. TAKE  WITH  A  FULL  GLASS  OF  WATER  AND  ON  AN  EMPTY  STOMACH 12 tablet 1  . amLODipine-benazepril (LOTREL) 5-20 MG capsule TAKE TWO CAPSULES BY MOUTH ONCE DAILY 180 capsule 2  . aspirin 81 MG tablet Take 81 mg by mouth every morning.     . benzonatate (TESSALON) 100 MG capsule Take 1 capsule (100 mg total) by mouth 2 (two) times daily as needed for cough. 20 capsule 0  . cyclobenzaprine (FLEXERIL) 5 MG tablet 1-2 qhs prn (Patient taking differently: Take 5-10 mg by mouth at bedtime as needed for muscle spasms. ) 14 tablet 0  . ferrous sulfate 325 (65 FE) MG tablet Take 325 mg by mouth daily with breakfast.    . hydrochlorothiazide (HYDRODIURIL) 25 MG  tablet TAKE ONE TABLET BY MOUTH ONCE DAILY 90 tablet 2  . Multiple Vitamin (MULTIVITAMIN WITH MINERALS) TABS tablet Take 1 tablet by mouth daily.    . ondansetron (ZOFRAN) 4 MG tablet Take 1 tablet (4 mg total) by mouth every 4 (four) hours as needed for nausea or vomiting. 20 tablet 0  . potassium chloride (K-DUR) 10 MEQ tablet Take 3 tablets (30 mEq total) by mouth daily. 270 tablet 3  . rosuvastatin (CRESTOR) 20 MG tablet TAKE ONE TABLET BY MOUTH ONCE DAILY 90 tablet 2   No current facility-administered medications on file prior to  visit.    Review of Systems  Constitutional: Negative for other unusual diaphoresis or sweats HENT: Negative for ear discharge or swelling Eyes: Negative for other worsening visual disturbances Respiratory: Negative for stridor or other swelling  Gastrointestinal: Negative for worsening distension or other blood Genitourinary: Negative for retention or other urinary change Musculoskeletal: Negative for other MSK pain or swelling Skin: Negative for color change or other new lesions Neurological: Negative for worsening tremors and other numbness  Psychiatric/Behavioral: Negative for worsening agitation or other fatigue All other system neg per pt    Objective:   Physical Exam BP 100/64   Pulse 82   Temp 98.6 F (37 C) (Oral)   Wt 175 lb (79.4 kg)   SpO2 91%   BMI 30.04 kg/m  VS noted,  Constitutional: Pt appears in NAD HENT: Head: NCAT.  Right Ear: External ear normal.  Left Ear: External ear normal.  Eyes: . Pupils are equal, round, and reactive to light. Conjunctivae and EOM are normal Nose: without d/c or deformity Neck: Neck supple. Gross normal ROM Cardiovascular: Normal rate and regular rhythm.   Pulmonary/Chest: Effort normal and breath sounds without rales or wheezing.  Neurological: Pt is alert. At baseline orientation, motor grossly intact Skin: Skin is warm. No rashes, other new lesions, no LE edema Psychiatric: Pt behavior is normal without agitation  No other exam findings    Assessment & Plan:

## 2016-08-01 DIAGNOSIS — E2839 Other primary ovarian failure: Secondary | ICD-10-CM | POA: Diagnosis not present

## 2016-08-02 NOTE — Assessment & Plan Note (Signed)
levemir is too expensive, to change to 70/30 relion bid asd, to cont cbg's/SSI,  to f/u any worsening symptoms or concerns

## 2016-08-02 NOTE — Assessment & Plan Note (Signed)
stable overall by history and exam, and pt to continue medical treatment as before,  to f/u any worsening symptoms or concerns 

## 2016-08-02 NOTE — Assessment & Plan Note (Signed)
stable overall by history and exam, recent data reviewed with pt, and pt to continue medical treatment as before,  to f/u any worsening symptoms or concerns BP Readings from Last 3 Encounters:  07/27/16 100/64  07/13/16 (!) 108/59  07/13/16 116/72

## 2016-08-03 ENCOUNTER — Encounter: Payer: Self-pay | Admitting: Internal Medicine

## 2016-09-03 ENCOUNTER — Telehealth: Payer: Self-pay | Admitting: *Deleted

## 2016-09-03 MED ORDER — GLUCOSE BLOOD VI STRP: ORAL_STRIP | 1 refills | Status: DC

## 2016-09-03 NOTE — Telephone Encounter (Signed)
Rc'd fax stating need to know how many times pt checking her BS due to insurance to process. Updated script and sent to [place].Marland KitchenJohny Chess

## 2016-09-17 ENCOUNTER — Encounter: Payer: Self-pay | Admitting: Gastroenterology

## 2016-09-21 ENCOUNTER — Ambulatory Visit: Payer: BC Managed Care – PPO | Admitting: Internal Medicine

## 2016-09-27 ENCOUNTER — Encounter: Payer: Self-pay | Admitting: Gastroenterology

## 2016-09-28 ENCOUNTER — Encounter: Payer: Self-pay | Admitting: Internal Medicine

## 2016-09-28 ENCOUNTER — Ambulatory Visit (INDEPENDENT_AMBULATORY_CARE_PROVIDER_SITE_OTHER): Payer: BC Managed Care – PPO | Admitting: Internal Medicine

## 2016-09-28 ENCOUNTER — Other Ambulatory Visit (INDEPENDENT_AMBULATORY_CARE_PROVIDER_SITE_OTHER): Payer: BC Managed Care – PPO

## 2016-09-28 ENCOUNTER — Other Ambulatory Visit: Payer: Self-pay | Admitting: Internal Medicine

## 2016-09-28 VITALS — BP 130/84 | HR 68 | Ht 64.0 in | Wt 184.0 lb

## 2016-09-28 DIAGNOSIS — Z114 Encounter for screening for human immunodeficiency virus [HIV]: Secondary | ICD-10-CM

## 2016-09-28 DIAGNOSIS — E119 Type 2 diabetes mellitus without complications: Secondary | ICD-10-CM | POA: Diagnosis not present

## 2016-09-28 DIAGNOSIS — Z0000 Encounter for general adult medical examination without abnormal findings: Secondary | ICD-10-CM | POA: Diagnosis not present

## 2016-09-28 LAB — BASIC METABOLIC PANEL
BUN: 22 mg/dL (ref 6–23)
CALCIUM: 9.6 mg/dL (ref 8.4–10.5)
CO2: 31 mEq/L (ref 19–32)
Chloride: 105 mEq/L (ref 96–112)
Creatinine, Ser: 0.94 mg/dL (ref 0.40–1.20)
GFR: 76.79 mL/min (ref >60.00)
GLUCOSE: 64 mg/dL — AB (ref 70–99)
Potassium: 3.5 mEq/L (ref 3.5–5.1)
SODIUM: 143 meq/L (ref 135–145)

## 2016-09-28 LAB — URINALYSIS, ROUTINE W REFLEX MICROSCOPIC
BILIRUBIN URINE: NEGATIVE
HGB URINE DIPSTICK: NEGATIVE
KETONES UR: NEGATIVE
LEUKOCYTES UA: NEGATIVE
Nitrite: NEGATIVE
Specific Gravity, Urine: 1.02 (ref 1.000–1.030)
TOTAL PROTEIN, URINE-UPE24: NEGATIVE
Urine Glucose: NEGATIVE
Urobilinogen, UA: 0.2 (ref 0.0–1.0)
pH: 6 (ref 5.0–8.0)

## 2016-09-28 LAB — CBC WITH DIFFERENTIAL/PLATELET
Basophils Absolute: 0 10*3/uL (ref 0.0–0.1)
Basophils Relative: 0.5 % (ref 0.0–3.0)
EOS PCT: 1.7 % (ref 0.0–5.0)
Eosinophils Absolute: 0.1 10*3/uL (ref 0.0–0.7)
HCT: 37.9 % (ref 36.0–46.0)
HEMOGLOBIN: 11.8 g/dL — AB (ref 12.0–15.0)
LYMPHS PCT: 40.1 % (ref 12.0–46.0)
Lymphs Abs: 2.5 10*3/uL (ref 0.7–4.0)
MCHC: 31.2 g/dL (ref 30.0–36.0)
MCV: 74.7 fl — AB (ref 78.0–100.0)
MONOS PCT: 6.5 % (ref 3.0–12.0)
Monocytes Absolute: 0.4 10*3/uL (ref 0.1–1.0)
Neutro Abs: 3.2 10*3/uL (ref 1.4–7.7)
Neutrophils Relative %: 51.2 % (ref 43.0–77.0)
Platelets: 258 10*3/uL (ref 150.0–400.0)
RBC: 5.07 Mil/uL (ref 3.87–5.11)
RDW: 16.3 % — ABNORMAL HIGH (ref 11.5–15.5)
WBC: 6.2 10*3/uL (ref 4.0–10.5)

## 2016-09-28 LAB — LIPID PANEL
CHOLESTEROL: 183 mg/dL (ref 0–200)
HDL: 65.9 mg/dL (ref >39.00)
LDL CALC: 95 mg/dL (ref 0–99)
NonHDL: 117.18
TRIGLYCERIDES: 112 mg/dL (ref 0.0–149.0)
Total CHOL/HDL Ratio: 3
VLDL: 22.4 mg/dL (ref 0.0–40.0)

## 2016-09-28 LAB — TSH: TSH: 3.68 u[IU]/mL (ref 0.35–4.50)

## 2016-09-28 LAB — HEPATIC FUNCTION PANEL
ALBUMIN: 4.3 g/dL (ref 3.5–5.2)
ALT: 18 U/L (ref 0–35)
AST: 18 U/L (ref 0–37)
Alkaline Phosphatase: 79 U/L (ref 39–117)
Bilirubin, Direct: 0 mg/dL (ref 0.0–0.3)
Total Bilirubin: 0.3 mg/dL (ref 0.2–1.2)
Total Protein: 7.5 g/dL (ref 6.0–8.3)

## 2016-09-28 LAB — MICROALBUMIN / CREATININE URINE RATIO
Creatinine,U: 163 mg/dL
MICROALB UR: 1.2 mg/dL (ref 0.0–1.9)
Microalb Creat Ratio: 0.7 mg/g (ref 0.0–30.0)

## 2016-09-28 LAB — HEMOGLOBIN A1C: Hgb A1c MFr Bld: 9.2 % — ABNORMAL HIGH (ref 4.6–6.5)

## 2016-09-28 MED ORDER — INSULIN NPH ISOPHANE & REGULAR (70-30) 100 UNIT/ML ~~LOC~~ SUSP
30.0000 [IU] | Freq: Two times a day (BID) | SUBCUTANEOUS | 11 refills | Status: DC
Start: 2016-09-28 — End: 2017-01-20

## 2016-09-28 MED ORDER — GLUCOSE BLOOD VI STRP
ORAL_STRIP | 1 refills | Status: DC
Start: 2016-09-28 — End: 2017-03-29

## 2016-09-28 NOTE — Assessment & Plan Note (Signed)

## 2016-09-28 NOTE — Assessment & Plan Note (Signed)
stable overall by history and exam, recent data reviewed with pt, and pt to continue medical treatment as before,  to f/u any worsening symptoms or concerns Lab Results  Component Value Date   HGBA1C 7.7 (H) 03/23/2016   For f/u lab today

## 2016-09-28 NOTE — Progress Notes (Signed)
Subjective:    Patient ID: Deborah Neal, female    DOB: 1950/09/11, 66 y.o.   MRN: [id number]  HPI  Here for wellness and f/u;  Overall doing ok;  Pt denies Chest pain, worsening SOB, DOE, wheezing, orthopnea, PND, worsening LE edema, palpitations, dizziness or syncope.  Pt denies neurological change such as new headache, facial or extremity weakness.  Pt denies polydipsia, polyuria, or low sugar symptoms. Pt states overall good compliance with treatment and medications, good tolerability, and has been trying to follow appropriate diet.  Pt denies worsening depressive symptoms, suicidal ideation or panic. No fever, night sweats, wt loss, loss of appetite, or other constitutional symptoms.  Pt states good ability with ADL's, has low fall risk, home safety reviewed and adequate, no other significant changes in hearing or vision, and only occasionally active with exercise.  Wt Readings from Last 3 Encounters:  09/28/16 184 lb (83.5 kg)  07/27/16 175 lb (79.4 kg)  07/13/16 170 lb (77.1 kg)   BP Readings from Last 3 Encounters:  09/28/16 130/84  07/27/16 100/64  07/13/16 (!) 108/59  Has just sched for f/u colonoscopy. rReleased from pap smears per GYN.   Has been checking cbg's until ran out strips recently.  CBG's mostly in the low 200's but has seveal occas recently 75-100. Past Medical History:  Diagnosis Date  . ALLERGIC RHINITIS 01/29/2007  . Arthritis   . ASTHMA 01/29/2007  . ASTHMA, WITH ACUTE EXACERBATION 06/24/2008  . BACK PAIN 01/29/2009  . CHEST PAIN 06/24/2008  . Family history of adverse reaction to anesthesia    Patients daughter has N/V after anesthesia  . GERD (gastroesophageal reflux disease)   . History of shingles   . HYPERLIPIDEMIA 01/29/2007  . HYPERTENSION 01/29/2007  . OSTEOPENIA 10/16/2007  . Type II or unspecified type diabetes mellitus without mention of complication, uncontrolled 11/29/2013   Past Surgical History:  Procedure Laterality Date  . CHOLECYSTECTOMY N/A  09/19/2015   Procedure: LAPAROSCOPIC CHOLECYSTECTOMY WITH INTRAOPERATIVE CHOLANGIOGRAM;  Surgeon: Jackolyn Confer, MD;  Location: [facility];  Service: General;  Laterality: N/A;  . COLONOSCOPY    . TONSILLECTOMY    . VIDEO BRONCHOSCOPY Bilateral 03/28/2014   Procedure: VIDEO BRONCHOSCOPY WITHOUT FLUORO;  Surgeon: Tanda Rockers, MD;  Location: [facility];  Service: Cardiopulmonary;  Laterality: Bilateral;    reports that she quit smoking about 13 years ago. She has never used smokeless tobacco. She reports that she does not drink alcohol or use drugs. family history includes Asthma in her daughter and father; Cancer in her mother; Diabetes in her father; Heart disease in her father; Hypertension in her father. No Known Allergies Current Outpatient Prescriptions on File Prior to Visit  Medication Sig Dispense Refill  . albuterol (PROVENTIL HFA;VENTOLIN HFA) 108 (90 Base) MCG/ACT inhaler Inhale 2 puffs into the lungs every 6 (six) hours as needed for wheezing or shortness of breath. 6.7 g 0  . alendronate (FOSAMAX) 70 MG tablet TAKE ONE TABLET BY MOUTH ONCE EVERY 7 DAYS. TAKE  WITH  A  FULL  GLASS  OF  WATER  AND  ON  AN  EMPTY  STOMACH 12 tablet 1  . amLODipine-benazepril (LOTREL) 5-20 MG capsule TAKE TWO CAPSULES BY MOUTH ONCE DAILY 180 capsule 2  . aspirin 81 MG tablet Take 81 mg by mouth every morning.     . Blood Glucose Monitoring Suppl (ONE TOUCH ULTRA 2) w/Device KIT Use as directed 1 each 0  . cyclobenzaprine (FLEXERIL) 5 MG tablet  1-2 qhs prn (Patient taking differently: Take 5-10 mg by mouth at bedtime as needed for muscle spasms. ) 14 tablet 0  . ferrous sulfate 325 (65 FE) MG tablet Take 325 mg by mouth daily with breakfast.    . glucose blood (ONE TOUCH ULTRA TEST) test strip Use to check blood sugars three times a day 300 each 1  . hydrochlorothiazide (HYDRODIURIL) 25 MG tablet TAKE ONE TABLET BY MOUTH ONCE DAILY 90 tablet 2  . insulin NPH-regular Human (NOVOLIN 70/30 RELION) (70-30)  100 UNIT/ML injection Inject 25 Units into the skin 2 (two) times daily with a meal. 10 mL 11  . Lancets MISC Use as directed four times daily 400 each 11  . Multiple Vitamin (MULTIVITAMIN WITH MINERALS) TABS tablet Take 1 tablet by mouth daily.    . potassium chloride (K-DUR) 10 MEQ tablet Take 3 tablets (30 mEq total) by mouth daily. 270 tablet 3  . rosuvastatin (CRESTOR) 20 MG tablet TAKE ONE TABLET BY MOUTH ONCE DAILY 90 tablet 2   No current facility-administered medications on file prior to visit.    Review of Systems Constitutional: Negative for other unusual diaphoresis, sweats, appetite or weight changes HENT: Negative for other worsening hearing loss, ear pain, facial swelling, mouth sores or neck stiffness.   Eyes: Negative for other worsening pain, redness or other visual disturbance.  Respiratory: Negative for other stridor or swelling Cardiovascular: Negative for other palpitations or other chest pain  Gastrointestinal: Negative for worsening diarrhea or loose stools, blood in stool, distention or other pain Genitourinary: Negative for hematuria, flank pain or other change in urine volume.  Musculoskeletal: Negative for myalgias or other joint swelling.  Skin: Negative for other color change, or other wound or worsening drainage.  Neurological: Negative for other syncope or numbness. Hematological: Negative for other adenopathy or swelling Psychiatric/Behavioral: Negative for hallucinations, other worsening agitation, SI, self-injury, or new decreased concentration All other system neg per pt    Objective:   Physical Exam BP 130/84   Pulse 68   Ht 5' 4" (1.626 m)   Wt 184 lb (83.5 kg)   SpO2 97%   BMI 31.58 kg/m  VS noted,  Constitutional: Pt is oriented to person, place, and time. Appears well-developed and well-nourished, in no significant distress and comfortable Head: Normocephalic and atraumatic  Eyes: Conjunctivae and EOM are normal. Pupils are equal, round, and  reactive to light Right Ear: External ear normal without discharge Left Ear: External ear normal without discharge Nose: Nose without discharge or deformity Mouth/Throat: Oropharynx is without other ulcerations and moist  Neck: Normal range of motion. Neck supple. No JVD present. No tracheal deviation present or significant neck LA or mass Cardiovascular: Normal rate, regular rhythm, normal heart sounds and intact distal pulses.   Pulmonary/Chest: WOB normal and breath sounds without rales or wheezing  Abdominal: Soft. Bowel sounds are normal. NT. No HSM  Musculoskeletal: Normal range of motion. Exhibits no edema Lymphadenopathy: Has no other cervical adenopathy.  Neurological: Pt is alert and oriented to person, place, and time. Pt has normal reflexes. No cranial nerve deficit. Motor grossly intact, Gait intact Skin: Skin is warm and dry. No rash noted or new ulcerations Psychiatric:  Has normal mood and affect. Behavior is normal without agitation No toher exam findings Lab Results  Component Value Date   WBC 9.0 07/13/2016   HGB 11.2 (L) 07/13/2016   HCT 35.1 (L) 07/13/2016   PLT 218 07/13/2016   GLUCOSE 496 (H) 07/13/2016  CHOL 145 03/23/2016   TRIG 100.0 03/23/2016   HDL 50.60 03/23/2016   LDLDIRECT 153.4 01/29/2009   LDLCALC 74 03/23/2016   ALT 33 07/06/2016   AST 21 07/06/2016   NA 134 (L) 07/13/2016   K 4.4 07/13/2016   CL 98 (L) 07/13/2016   CREATININE 1.28 (H) 07/13/2016   BUN 25 (H) 07/13/2016   CO2 26 07/13/2016   TSH 3.13 09/16/2015   HGBA1C 7.7 (H) 03/23/2016   MICROALBUR 1.1 09/16/2015       Assessment & Plan:

## 2016-09-28 NOTE — Patient Instructions (Addendum)
You had the Prevnar pneumonia shot today  Please continue all other medications as before, and refills have been done if requested.  Please have the pharmacy call with any other refills you may need.  Please continue your efforts at being more active, low cholesterol diet, and weight control.  You are otherwise up to date with prevention measures today.  Please keep your appointments with your specialists as you may have planned  Please go to the LAB in the Basement (turn left off the elevator) for the tests to be done today  You will be contacted by phone if any changes need to be made immediately.  Otherwise, you will receive a letter about your results with an explanation, but please check with MyChart first.  Please remember to sign up for MyChart if you have not done so, as this will be important to you in the future with finding out test results, communicating by private email, and scheduling acute appointments online when needed.  If you have Medicare related insurance (such as traditional Medicare, [employer] or [employer], or similar), Please make an appointment at the [facility] with Sharee Pimple, the [employer], for your Wellness Visit in this office, which is a benefit with your insurance.  Please return in 6 months, or sooner if needed, with Lab testing done 3-5 days before

## 2016-09-29 ENCOUNTER — Telehealth: Payer: Self-pay

## 2016-09-29 ENCOUNTER — Other Ambulatory Visit (INDEPENDENT_AMBULATORY_CARE_PROVIDER_SITE_OTHER): Payer: BC Managed Care – PPO

## 2016-09-29 DIAGNOSIS — E119 Type 2 diabetes mellitus without complications: Secondary | ICD-10-CM | POA: Diagnosis not present

## 2016-09-29 LAB — IBC PANEL
IRON: 54 ug/dL (ref 42–145)
SATURATION RATIOS: 14.3 % — AB (ref 20.0–50.0)
TRANSFERRIN: 270 mg/dL (ref 212.0–360.0)

## 2016-09-29 LAB — HIV ANTIBODY (ROUTINE TESTING W REFLEX): HIV 1&2 Ab, 4th Generation: NONREACTIVE

## 2016-09-29 NOTE — Telephone Encounter (Signed)
-----   Message from Biagio Borg, MD sent at 09/28/2016  8:40 PM EDT ----- Letter sent, cont same tx except  The test results show that your current treatment is OK, except there is mild worsening anemia, for which we will be checking your iron level as well.  The A1c is too high as well, and we need to increase the 70/30 insulin to 30 units twice per day.  A new prescription will be sent.  Please continue to check your sugars as you do, and let us know if you have low blood sugars.     Deborah Neal to please inform pt, I will do rx

## 2016-09-29 NOTE — Telephone Encounter (Signed)
Pt was informed and expressed understanding. She wanted to know how low of a blood sugar should she be looking out for in order to know when to call?

## 2016-09-29 NOTE — Telephone Encounter (Signed)
There may not be an exact number, but if you feel weak, tired, hungry, confused or shaky with a lower sugar, this would be significant.  In general, I would say a sugar less than 70-80 for her would be too low

## 2016-09-29 NOTE — Telephone Encounter (Signed)
Pt has been informed.

## 2016-11-19 ENCOUNTER — Encounter: Payer: Self-pay | Admitting: Gastroenterology

## 2016-11-19 ENCOUNTER — Ambulatory Visit (AMBULATORY_SURGERY_CENTER): Payer: Self-pay | Admitting: *Deleted

## 2016-11-19 VITALS — Ht 64.0 in | Wt 192.8 lb

## 2016-11-19 DIAGNOSIS — Z1211 Encounter for screening for malignant neoplasm of colon: Secondary | ICD-10-CM

## 2016-11-19 MED ORDER — NA SULFATE-K SULFATE-MG SULF 17.5-3.13-1.6 GM/177ML PO SOLN: ORAL | 0 refills | Status: DC

## 2016-11-19 NOTE — Progress Notes (Signed)
No allergies to eggs or soy. No problems with anesthesia.  Pt not given Emmi instructions for colonoscopy; no computer access  No oxygen use  No diet drug use  

## 2016-12-03 ENCOUNTER — Ambulatory Visit (AMBULATORY_SURGERY_CENTER): Payer: Medicare Other | Admitting: Gastroenterology

## 2016-12-03 ENCOUNTER — Encounter: Payer: Self-pay | Admitting: Gastroenterology

## 2016-12-03 VITALS — BP 124/74 | HR 71 | Temp 97.8°F | Resp 17 | Ht 64.0 in | Wt 192.0 lb

## 2016-12-03 DIAGNOSIS — Z1212 Encounter for screening for malignant neoplasm of rectum: Secondary | ICD-10-CM | POA: Diagnosis not present

## 2016-12-03 DIAGNOSIS — Z1211 Encounter for screening for malignant neoplasm of colon: Secondary | ICD-10-CM

## 2016-12-03 DIAGNOSIS — D124 Benign neoplasm of descending colon: Secondary | ICD-10-CM

## 2016-12-03 DIAGNOSIS — D120 Benign neoplasm of cecum: Secondary | ICD-10-CM | POA: Diagnosis not present

## 2016-12-03 MED ORDER — SODIUM CHLORIDE 0.9 % IV SOLN: 500.0000 mL | INTRAVENOUS | Status: DC

## 2016-12-03 NOTE — Patient Instructions (Signed)
YOU HAD AN ENDOSCOPIC PROCEDURE TODAY AT THE [facility]:   Refer to the procedure report that was given to you for any specific questions about what was found during the examination.  If the procedure report does not answer your questions, please call your gastroenterologist to clarify.  If you requested that your care partner not be given the details of your procedure findings, then the procedure report has been included in a sealed envelope for you to review at your convenience later.  YOU SHOULD EXPECT: Some feelings of bloating in the abdomen. Passage of more gas than usual.  Walking can help get rid of the air that was put into your GI tract during the procedure and reduce the bloating. If you had a lower endoscopy (such as a colonoscopy or flexible sigmoidoscopy) you may notice spotting of blood in your stool or on the toilet paper. If you underwent a bowel prep for your procedure, you may not have a normal bowel movement for a few days.  Please Note:  You might notice some irritation and congestion in your nose or some drainage.  This is from the oxygen used during your procedure.  There is no need for concern and it should clear up in a day or so.  SYMPTOMS TO REPORT IMMEDIATELY:   Following lower endoscopy (colonoscopy or flexible sigmoidoscopy):  Excessive amounts of blood in the stool  Significant tenderness or worsening of abdominal pains  Swelling of the abdomen that is new, acute  Fever of 100F or higher  For urgent or emergent issues, a gastroenterologist can be reached at any hour by calling [phone].   DIET:  We do recommend a small meal at first, but then you may proceed to your regular diet.  Drink plenty of fluids but you should avoid alcoholic beverages for 24 hours.  ACTIVITY:  You should plan to take it easy for the rest of today and you should NOT DRIVE or use heavy machinery until tomorrow (because of the sedation medicines used during the test).     FOLLOW UP: Our staff will call the number listed on your records the next business day following your procedure to check on you and address any questions or concerns that you may have regarding the information given to you following your procedure. If we do not reach you, we will leave a message.  However, if you are feeling well and you are not experiencing any problems, there is no need to return our call.  We will assume that you have returned to your regular daily activities without incident.  If any biopsies were taken you will be contacted by phone or by letter within the next 1-3 weeks.  Please call us at [phone] if you have not heard about the biopsies in 3 weeks.   Await for biopsy results to determine next repeat Colonoscopy screening Polyps (handout given)  SIGNATURES/CONFIDENTIALITY: You and/or your care partner have signed paperwork which will be entered into your electronic medical record.  These signatures attest to the fact that that the information above on your After Visit Summary has been reviewed and is understood.  Full responsibility of the confidentiality of this discharge information lies with you and/or your care-partner. 

## 2016-12-03 NOTE — Op Note (Signed)
[facility] Patient Name: Deborah Neal Procedure Date: 12/03/2016 8:14 AM MRN: [id number] Endoscopist: Milus Banister , MD Age: 66 Referring MD:  Date of Birth: 26-Nov-1950 Gender: Female Account #: [id number] Procedure:                Colonoscopy Indications:              Screening for colorectal malignant neoplasm Medicines:                Monitored Anesthesia Care Procedure:                Pre-Anesthesia Assessment:                           - Prior to the procedure, a History and Physical                            was performed, and patient medications and                            allergies were reviewed. The patient's tolerance of                            previous anesthesia was also reviewed. The risks                            and benefits of the procedure and the sedation                            options and risks were discussed with the patient.                            All questions were answered, and informed consent                            was obtained. Prior Anticoagulants: The patient has                            taken no previous anticoagulant or antiplatelet                            agents. ASA Grade Assessment: II - A patient with                            mild systemic disease. After reviewing the risks                            and benefits, the patient was deemed in                            satisfactory condition to undergo the procedure.                           After obtaining informed consent, the colonoscope  was passed under direct vision. Throughout the                            procedure, the patient's blood pressure, pulse, and                            oxygen saturations were monitored continuously. The                            Colonoscope was introduced through the anus and                            advanced to the the cecum, identified by                            appendiceal orifice and  ileocecal valve. The                            colonoscopy was performed without difficulty. The                            patient tolerated the procedure well. The quality                            of the bowel preparation was excellent. The                            ileocecal valve, appendiceal orifice, and rectum                            were photographed. Scope In: 8:23:37 AM Scope Out: 8:32:34 AM Scope Withdrawal Time: 0 hours 7 minutes 9 seconds  Total Procedure Duration: 0 hours 8 minutes 57 seconds  Findings:                 Two sessile polyps were found in the descending                            colon and ileocecal valve. The polyps were 2 to 4                            mm in size. These polyps were removed with a cold                            snare. Resection and retrieval were complete.                           The exam was otherwise without abnormality on                            direct and retroflexion views. Complications:            No immediate complications. Estimated blood loss:  None. Estimated Blood Loss:     Estimated blood loss: none. Impression:               - Two 2 to 4 mm polyps in the descending colon and                            at the ileocecal valve, removed with a cold snare.                            Resected and retrieved.                           - The examination was otherwise normal on direct                            and retroflexion views. Recommendation:           - Patient has a contact number available for                            emergencies. The signs and symptoms of potential                            delayed complications were discussed with the                            patient. Return to normal activities tomorrow.                            Written discharge instructions were provided to the                            patient.                           - Resume previous diet.                            - Continue present medications.                           You will receive a letter within 2-3 weeks with the                            pathology results and my final recommendations.                           If the polyp(s) is proven to be 'pre-cancerous' on                            pathology, you will need repeat colonoscopy in 5                            years. If the polyp(s) is NOT 'precancerous' on  pathology then you should repeat colon cancer                            screening in 10 years with colonoscopy without need                            for colon cancer screening by any method prior to                            then (including stool testing). Milus Banister, MD 12/03/2016 8:37:02 AM This report has been signed electronically.

## 2016-12-03 NOTE — Progress Notes (Signed)
Pt's states no medical or surgical changes since previsit or office visit. 

## 2016-12-03 NOTE — Progress Notes (Signed)
Called to room to assist during endoscopic procedure.  Patient ID and intended procedure confirmed with present staff. Received instructions for my participation in the procedure from the performing physician.  

## 2016-12-03 NOTE — Progress Notes (Signed)
A and O x3. Report to RN. Tolerated MAC anesthesia well.

## 2016-12-06 ENCOUNTER — Telehealth: Payer: Self-pay | Admitting: *Deleted

## 2016-12-06 NOTE — Telephone Encounter (Signed)
  Follow up Call-  Call back number 12/03/2016  Post procedure Call Back phone  # [phone]  Permission to leave phone message Yes  Some recent data might be hidden     Patient questions:  Do you have a fever, pain , or abdominal swelling? No. Pain Score  0 *  Have you tolerated food without any problems? Yes.    Have you been able to return to your normal activities? Yes.    Do you have any questions about your discharge instructions: Diet   No. Medications  No. Follow up visit  No.  Do you have questions or concerns about your Care? No.  Actions: * If pain score is 4 or above: No action needed, pain <4.

## 2016-12-06 NOTE — Telephone Encounter (Signed)
Left message on f/u call 

## 2016-12-12 ENCOUNTER — Encounter: Payer: Self-pay | Admitting: Gastroenterology

## 2017-01-20 ENCOUNTER — Telehealth: Payer: Self-pay | Admitting: Internal Medicine

## 2017-01-20 MED ORDER — INSULIN NPH ISOPHANE & REGULAR (70-30) 100 UNIT/ML ~~LOC~~ SUSP: 30.0000 [IU] | Freq: Two times a day (BID) | SUBCUTANEOUS | 5 refills | Status: DC

## 2017-01-20 NOTE — Telephone Encounter (Signed)
Pt called for a refil of her  insulin NPH-regular Human (NOVOLIN 70/30 RELION) (70-30) 100 UNIT/ML injection Pharmacy on file  Please advise

## 2017-01-20 NOTE — Telephone Encounter (Signed)
Sent refill to [facility].../lmb 

## 2017-01-27 ENCOUNTER — Other Ambulatory Visit: Payer: Self-pay | Admitting: Internal Medicine

## 2017-01-27 DIAGNOSIS — Z1231 Encounter for screening mammogram for malignant neoplasm of breast: Secondary | ICD-10-CM

## 2017-02-14 ENCOUNTER — Ambulatory Visit
Admission: RE | Admit: 2017-02-14 | Discharge: 2017-02-14 | Disposition: A | Discharge status: Home / Self Care | Payer: Medicare Other | Source: Ambulatory Visit | Attending: Internal Medicine | Admitting: Internal Medicine

## 2017-02-14 DIAGNOSIS — Z1231 Encounter for screening mammogram for malignant neoplasm of breast: Secondary | ICD-10-CM

## 2017-02-16 ENCOUNTER — Emergency Department (HOSPITAL_COMMUNITY)
Admission: EM | Admit: 2017-02-16 | Discharge: 2017-02-16 | Disposition: A | Discharge status: Home / Self Care | Payer: Medicare Other | Attending: Emergency Medicine | Admitting: Emergency Medicine

## 2017-02-16 ENCOUNTER — Encounter (HOSPITAL_COMMUNITY): Payer: Self-pay | Admitting: Emergency Medicine

## 2017-02-16 ENCOUNTER — Telehealth: Payer: Self-pay | Admitting: Internal Medicine

## 2017-02-16 DIAGNOSIS — Z87891 Personal history of nicotine dependence: Secondary | ICD-10-CM | POA: Diagnosis not present

## 2017-02-16 DIAGNOSIS — E1165 Type 2 diabetes mellitus with hyperglycemia: Secondary | ICD-10-CM | POA: Insufficient documentation

## 2017-02-16 DIAGNOSIS — R739 Hyperglycemia, unspecified: Secondary | ICD-10-CM | POA: Diagnosis present

## 2017-02-16 DIAGNOSIS — Z79899 Other long term (current) drug therapy: Secondary | ICD-10-CM | POA: Insufficient documentation

## 2017-02-16 DIAGNOSIS — Z7982 Long term (current) use of aspirin: Secondary | ICD-10-CM | POA: Insufficient documentation

## 2017-02-16 DIAGNOSIS — I10 Essential (primary) hypertension: Secondary | ICD-10-CM | POA: Diagnosis not present

## 2017-02-16 DIAGNOSIS — J45909 Unspecified asthma, uncomplicated: Secondary | ICD-10-CM | POA: Insufficient documentation

## 2017-02-16 DIAGNOSIS — Z794 Long term (current) use of insulin: Secondary | ICD-10-CM | POA: Diagnosis not present

## 2017-02-16 LAB — CBG MONITORING, ED
Glucose-Capillary: 522 mg/dL (ref 65–99)
Glucose-Capillary: 566 mg/dL (ref 65–99)
Glucose-Capillary: 425 mg/dL — ABNORMAL HIGH (ref 65–99)

## 2017-02-16 LAB — BASIC METABOLIC PANEL
ANION GAP: 16 — AB (ref 5–15)
BUN: 41 mg/dL — ABNORMAL HIGH (ref 6–20)
CALCIUM: 9.4 mg/dL (ref 8.9–10.3)
CHLORIDE: 90 mmol/L — AB (ref 101–111)
CO2: 23 mmol/L (ref 22–32)
Creatinine, Ser: 1.87 mg/dL — ABNORMAL HIGH (ref 0.44–1.00)
GFR calc Af Amer: 31 mL/min — ABNORMAL LOW (ref >60)
GFR calc non Af Amer: 27 mL/min — ABNORMAL LOW (ref >60)
GLUCOSE: 567 mg/dL — AB (ref 65–99)
Potassium: 4.2 mmol/L (ref 3.5–5.1)
Sodium: 129 mmol/L — ABNORMAL LOW (ref 135–145)

## 2017-02-16 LAB — CBC
HEMATOCRIT: 40.1 % (ref 36.0–46.0)
HEMOGLOBIN: 12.8 g/dL (ref 12.0–15.0)
MCH: 23.4 pg — AB (ref 26.0–34.0)
MCHC: 31.9 g/dL (ref 30.0–36.0)
MCV: 73.3 fL — AB (ref 78.0–100.0)
Platelets: 216 10*3/uL (ref 150–400)
RBC: 5.47 MIL/uL — AB (ref 3.87–5.11)
RDW: 13.7 % (ref 11.5–15.5)
WBC: 7.1 10*3/uL (ref 4.0–10.5)

## 2017-02-16 LAB — BLOOD GAS, VENOUS
Acid-Base Excess: 0.2 mmol/L (ref 0.0–2.0)
Bicarbonate: 26.3 mmol/L (ref 20.0–28.0)
O2 Saturation: 41.7 %
Patient temperature: 98.6
pCO2, Ven: 51.5 mmHg (ref 44.0–60.0)
pH, Ven: 7.328 (ref 7.250–7.430)

## 2017-02-16 LAB — URINALYSIS, ROUTINE W REFLEX MICROSCOPIC
Bilirubin Urine: NEGATIVE
Glucose, UA: >=500 mg/dL — AB
Hgb urine dipstick: NEGATIVE
KETONES UR: 5 mg/dL — AB
Nitrite: NEGATIVE
PROTEIN: NEGATIVE mg/dL
Specific Gravity, Urine: 1.022 (ref 1.005–1.030)
pH: 5 (ref 5.0–8.0)

## 2017-02-16 MED ORDER — SODIUM CHLORIDE 0.9 % IV BOLUS (SEPSIS)
1000.0000 mL | Freq: Once | INTRAVENOUS | Status: AC
  Administered 2017-02-16: 1000 mL via INTRAVENOUS

## 2017-02-16 MED ORDER — INSULIN ASPART 100 UNIT/ML ~~LOC~~ SOLN
10.0000 [IU] | Freq: Once | SUBCUTANEOUS | Status: AC
  Administered 2017-02-16: 10 [IU] via SUBCUTANEOUS
  Filled 2017-02-16: qty 1

## 2017-02-16 NOTE — Telephone Encounter (Signed)
Spoke to Dr. Raeford Razor about this call. Forwarding to him as FYI  Spoke to pt - Pt is waiting on dtr to pick her.  BS 02/15/2017 was over 600, 02/14/2017 was over 500 and pt states that she has been having BS that are fluctuating.   Pt stated that her dtr would be there to pick her up around 11. Pt states that she has been taking 25 units of the 70/30 novolin bid.   Pt states that she is not having sob, cp, doe or dizziness.  Pt states that she is having some stiffness in her neck.

## 2017-02-16 NOTE — ED Provider Notes (Signed)
[facility] DEPT Provider Note   CSN: [id number] Arrival date & time: 02/16/17  1203     History   Chief Complaint Chief Complaint  Patient presents with  . Hyperglycemia    HPI Deborah Neal is a 66 y.o. female with a past medical history of type 2 diabetes, HTN, HLD who presented to the ED today due to a abnormal lab value.  Patient states that she checked her blood sugar this morning before taking her insulin and it was elevated at 500.  Patient reports compliance with her insulin she takes 70/30 and denies missing any doses.  She does report some dietary mismanagement.  Patient denies any nausea, vomiting, abdominal pain, chest pain.  She still makes urine.  HPI  Past Medical History:  Diagnosis Date  . ALLERGIC RHINITIS 01/29/2007  . Arthritis   . ASTHMA 01/29/2007  . ASTHMA, WITH ACUTE EXACERBATION 06/24/2008  . BACK PAIN 01/29/2009  . CHEST PAIN 06/24/2008  . Family history of adverse reaction to anesthesia    Patients daughter has N/V after anesthesia  . GERD (gastroesophageal reflux disease)   . History of shingles   . HYPERLIPIDEMIA 01/29/2007  . HYPERTENSION 01/29/2007  . OSTEOPENIA 10/16/2007  . Type II or unspecified type diabetes mellitus without mention of complication, uncontrolled 11/29/2013    Patient Active Problem List   Diagnosis Date Noted  . Preventative health care 09/28/2016  . Dehydration   . Hyperglycemic crisis in diabetes mellitus ([place]) 07/06/2016  . AKI (acute kidney injury) ([place]) 07/06/2016  . Hyperkalemia 07/06/2016  . S/P laparoscopic cholecystectomy 09/19/2015  . Abnormal chest CT 06/21/2014  . Diabetes mellitus type II, non insulin dependent ([place]) 11/29/2013  . Obesity, unspecified 09/19/2012  . Plantar fasciitis, bilateral 06/08/2011  . BACK PAIN 01/29/2009  . Osteopenia 10/16/2007  . Essential hypertension 01/29/2007  . Asthma 01/29/2007    Past Surgical History:  Procedure Laterality Date  .  CHOLECYSTECTOMY N/A 09/19/2015   Procedure: LAPAROSCOPIC CHOLECYSTECTOMY WITH INTRAOPERATIVE CHOLANGIOGRAM;  Surgeon: Jackolyn Confer, MD;  Location: [facility];  Service: General;  Laterality: N/A;  . COLONOSCOPY    . TONSILLECTOMY  1956  . VIDEO BRONCHOSCOPY Bilateral 03/28/2014   Procedure: VIDEO BRONCHOSCOPY WITHOUT FLUORO;  Surgeon: Tanda Rockers, MD;  Location: [facility];  Service: Cardiopulmonary;  Laterality: Bilateral;    OB History    Gravida Para Term Preterm AB Living   '5 5 5     5   ' SAB TAB Ectopic Multiple Live Births           5       Home Medications    Prior to Admission medications   Medication Sig Start Date End Date Taking? Authorizing Provider  albuterol (PROVENTIL HFA;VENTOLIN HFA) 108 (90 Base) MCG/ACT inhaler Inhale 2 puffs into the lungs every 6 (six) hours as needed for wheezing or shortness of breath. 05/22/16  Yes Brunetta Jeans, PA-C  amLODipine-benazepril (LOTREL) 5-20 MG capsule TAKE TWO CAPSULES BY MOUTH ONCE DAILY 06/01/16  Yes Biagio Borg, MD  aspirin 81 MG tablet Take 81 mg by mouth every morning.    Yes [provider]  ferrous sulfate 325 (65 FE) MG tablet Take 325 mg by mouth daily with breakfast.   Yes [provider]  hydrochlorothiazide (HYDRODIURIL) 25 MG tablet TAKE ONE TABLET BY MOUTH ONCE DAILY 06/01/16  Yes Biagio Borg, MD  insulin NPH-regular Human (NOVOLIN 70/30 RELION) (70-30) 100 UNIT/ML injection Inject 30 Units into the  skin 2 (two) times daily with a meal. 01/20/17  Yes Biagio Borg, MD  Multiple Vitamin (MULTIVITAMIN WITH MINERALS) TABS tablet Take 1 tablet by mouth daily.   Yes [provider]  potassium chloride (K-DUR) 10 MEQ tablet Take 3 tablets (30 mEq total) by mouth daily. 03/26/16  Yes Biagio Borg, MD  rosuvastatin (CRESTOR) 20 MG tablet TAKE ONE TABLET BY MOUTH ONCE DAILY 06/01/16  Yes Biagio Borg, MD  alendronate (FOSAMAX) 70 MG tablet TAKE ONE TABLET BY MOUTH ONCE EVERY 7 DAYS. TAKE  WITH  A   FULL  GLASS  OF  WATER  AND  ON  AN  EMPTY  STOMACH Patient not taking: Reported on 02/16/2017 02/18/15   Biagio Borg, MD  Blood Glucose Monitoring Suppl (ONE TOUCH ULTRA 2) w/Device KIT Use as directed 07/27/16   Biagio Borg, MD  cyclobenzaprine (FLEXERIL) 5 MG tablet 1-2 qhs prn Patient not taking: Reported on 11/19/2016 06/21/14   Hendricks Limes, MD  glucose blood (ONE TOUCH ULTRA TEST) test strip Use to check blood sugars three times a day 09/28/16   Biagio Borg, MD  Lancets MISC Use as directed four times daily 07/27/16   Biagio Borg, MD    Family History Family History  Problem Relation Age of Onset  . Cancer Mother        ? type   . Diabetes Father   . Hypertension Father   . Heart disease Father   . Asthma Father   . Asthma Daughter   . Colon cancer Neg Hx   . Esophageal cancer Neg Hx   . Rectal cancer Neg Hx   . Stomach cancer Neg Hx   . Breast cancer Neg Hx     Social History Social History  Substance Use Topics  . Smoking status: Former Smoker    Quit date: 09/07/2003  . Smokeless tobacco: Never Used  . Alcohol use No     Allergies   Patient has no known allergies.   Review of Systems Review of Systems  All other systems reviewed and are negative.    Physical Exam Updated Vital Signs BP 101/83   Pulse 91   Temp 98.7 F (37.1 C)   Resp 15   Ht '5\' 4"'  (1.626 m)   Wt 83.9 kg (185 lb)   SpO2 100%   BMI 31.76 kg/m   Physical Exam  Constitutional: She is oriented to person, place, and time. She appears well-developed and well-nourished. No distress.  HENT:  Head: Normocephalic and atraumatic.  Mouth/Throat: No oropharyngeal exudate.  Eyes: Pupils are equal, round, and reactive to light. Conjunctivae and EOM are normal. Right eye exhibits no discharge. Left eye exhibits no discharge. No scleral icterus.  Cardiovascular: Normal rate, regular rhythm, normal heart sounds and intact distal pulses.  Exam reveals no gallop and no friction rub.   No  murmur heard. Pulmonary/Chest: Effort normal and breath sounds normal. No respiratory distress. She has no wheezes. She has no rales. She exhibits no tenderness.  Abdominal: Soft. She exhibits no distension. There is no tenderness. There is no guarding.  Musculoskeletal: Normal range of motion. She exhibits no edema.  Neurological: She is alert and oriented to person, place, and time.  Skin: Skin is warm and dry. No rash noted. She is not diaphoretic. No erythema. No pallor.  Psychiatric: She has a normal mood and affect. Her behavior is normal.  Nursing note and vitals reviewed.  ED Treatments / Results  Labs (all labs ordered are listed, but only abnormal results are displayed) Labs Reviewed  BASIC METABOLIC PANEL - Abnormal; Notable for the following:       Result Value   Sodium 129 (*)    Chloride 90 (*)    Glucose, Bld 567 (*)    BUN 41 (*)    Creatinine, Ser 1.87 (*)    GFR calc non Af Amer 27 (*)    GFR calc Af Amer 31 (*)    Anion gap 16 (*)    All other components within normal limits  CBC - Abnormal; Notable for the following:    RBC 5.47 (*)    MCV 73.3 (*)    MCH 23.4 (*)    All other components within normal limits  URINALYSIS, ROUTINE W REFLEX MICROSCOPIC - Abnormal; Notable for the following:    APPearance HAZY (*)    Glucose, UA >=500 (*)    Ketones, ur 5 (*)    Leukocytes, UA TRACE (*)    Bacteria, UA RARE (*)    Squamous Epithelial / LPF 0-5 (*)    All other components within normal limits  CBG MONITORING, ED - Abnormal; Notable for the following:    Glucose-Capillary 522 (*)    All other components within normal limits  CBG MONITORING, ED - Abnormal; Notable for the following:    Glucose-Capillary 566 (*)    All other components within normal limits  URINE CULTURE  BLOOD GAS, VENOUS  CBG MONITORING, ED    EKG  EKG Interpretation None       Radiology No results found.  Procedures Procedures (including critical care time)  Medications  Ordered in ED Medications  insulin aspart (novoLOG) injection 10 Units (10 Units Subcutaneous Given 02/16/17 1524)  sodium chloride 0.9 % bolus 1,000 mL (1,000 mLs Intravenous New Bag/Given 02/16/17 1525)     Initial Impression / Assessment and Plan / ED Course  I have reviewed the triage vital signs and the nursing notes.  Pertinent labs & imaging results that were available during my care of the patient were reviewed by me and considered in my medical decision making (see chart for details).    66 year old female with type 2 diabetes presented to the ED today because she checked her blood sugar at home and it was over 500.  She is completely asymptomatic and on arrival to the ED appears well.  Vitals are stable.  Patient reports compliance with her home insulin but does report significant dietary mismanagement, with poor diet.  Labs significant for sodium 129, glucose 567 oh, anion gap 16.  VBG obtained that she did not appear acidotic.  Bicarb is normal at 23.  I do not suspect that she is in DKA.  Her creatinine is elevated at 1.87 but I suspect she is dry.  I have given her a liter of normal saline and 10 units of insulin.  Patient still feeling well feel that she is safe for discharge with close PCP follow-up.  I have actually presented in box message to her PCP Dr. [name] so that patient may get repeat BMP in 1 week.  Patient educated on appropriate diet for 7030 insulin administration.  Return precautions outlined in patient discharge instructions.  Patient was discussed with and seen by Dr. Venora Maples who agrees with the treatment plan.   Final Clinical Impressions(s) / ED Diagnoses   Final diagnoses:  None    New Prescriptions New Prescriptions  No medications on file     Wynelle Fanny 02/16/17 1734    Jola Schmidt, MD 02/17/17 [phone]

## 2017-02-16 NOTE — ED Triage Notes (Signed)
Pt complaint of high blood sugar for a few days unrelieved by insulin shots. Pt denies associated symptoms. Blood sugar read HIGH at home.

## 2017-02-16 NOTE — Telephone Encounter (Signed)
Patient Name: Deborah Neal DOB: 22-May-1950 Initial Comment Callers bs is over 600. Nurse Assessment Nurse: Vallery Sa, RN, Cathy Date/Time (Eastern Time): 02/16/2017 9:23:04 AM Confirm and document reason for call. If symptomatic, describe symptoms. ---[name] states her blood sugar was over 600 about 30 minutes ago. No vomiting or diarrhea. Alert and responsive. Does the patient have any new or worsening symptoms? ---Yes Will a triage be completed? ---Yes Related visit to physician within the last 2 weeks? ---No Does the PT have any chronic conditions? (i.e. diabetes, asthma, etc.) ---Yes List chronic conditions. ---Asthma, High Blood Pressure, Diabetes Is this a behavioral health or substance abuse call? ---No Guidelines Guideline Title Affirmed Question Affirmed Notes Diabetes - High Blood Sugar Blood glucose > 500 mg/dl (27.5 mmol/l) Final Disposition User Go to ED Now (or PCP triage) Vallery Sa, RN, Cathy Comments No appointments available at [facility] office. Caller plans to go to the ER. Referrals [facility] - ED Caller Disagree/Comply Comply Caller Understands Yes PreDisposition Go to ED

## 2017-02-16 NOTE — Discharge Instructions (Signed)
Continue taking home insulin as prescribed.  Adjust diet to carb modified.  Continue checking home blood sugars as you already are.  Follow-up with your primary care doctor in 1 week for lab redraw.  May need to adjust home insulin dose if your blood sugars remain elevated.  Return to the ED if you experience nausea, vomiting or significantly elevated blood sugars despite compliance with her home insulin.

## 2017-02-18 LAB — URINE CULTURE

## 2017-02-21 ENCOUNTER — Encounter (HOSPITAL_COMMUNITY): Payer: Self-pay | Admitting: Radiology

## 2017-02-21 ENCOUNTER — Emergency Department (HOSPITAL_COMMUNITY): Payer: Medicare Other

## 2017-02-21 ENCOUNTER — Observation Stay (HOSPITAL_COMMUNITY)
Admission: EM | Admit: 2017-02-21 | Discharge: 2017-02-22 | Disposition: A | Discharge status: Home / Self Care | Payer: Medicare Other | Attending: Internal Medicine | Admitting: Internal Medicine

## 2017-02-21 DIAGNOSIS — G9341 Metabolic encephalopathy: Secondary | ICD-10-CM

## 2017-02-21 DIAGNOSIS — R739 Hyperglycemia, unspecified: Secondary | ICD-10-CM | POA: Diagnosis present

## 2017-02-21 DIAGNOSIS — R410 Disorientation, unspecified: Secondary | ICD-10-CM | POA: Diagnosis not present

## 2017-02-21 DIAGNOSIS — E785 Hyperlipidemia, unspecified: Secondary | ICD-10-CM | POA: Diagnosis not present

## 2017-02-21 DIAGNOSIS — Z87891 Personal history of nicotine dependence: Secondary | ICD-10-CM | POA: Diagnosis not present

## 2017-02-21 DIAGNOSIS — E669 Obesity, unspecified: Secondary | ICD-10-CM | POA: Insufficient documentation

## 2017-02-21 DIAGNOSIS — E860 Dehydration: Secondary | ICD-10-CM | POA: Diagnosis present

## 2017-02-21 DIAGNOSIS — Z23 Encounter for immunization: Secondary | ICD-10-CM | POA: Diagnosis not present

## 2017-02-21 DIAGNOSIS — M8580 Other specified disorders of bone density and structure, unspecified site: Secondary | ICD-10-CM | POA: Diagnosis present

## 2017-02-21 DIAGNOSIS — R531 Weakness: Secondary | ICD-10-CM | POA: Insufficient documentation

## 2017-02-21 DIAGNOSIS — N179 Acute kidney failure, unspecified: Secondary | ICD-10-CM | POA: Diagnosis present

## 2017-02-21 DIAGNOSIS — Z79899 Other long term (current) drug therapy: Secondary | ICD-10-CM | POA: Insufficient documentation

## 2017-02-21 DIAGNOSIS — E1165 Type 2 diabetes mellitus with hyperglycemia: Principal | ICD-10-CM

## 2017-02-21 DIAGNOSIS — Z8709 Personal history of other diseases of the respiratory system: Secondary | ICD-10-CM | POA: Diagnosis not present

## 2017-02-21 DIAGNOSIS — K219 Gastro-esophageal reflux disease without esophagitis: Secondary | ICD-10-CM | POA: Insufficient documentation

## 2017-02-21 DIAGNOSIS — Z794 Long term (current) use of insulin: Secondary | ICD-10-CM | POA: Insufficient documentation

## 2017-02-21 DIAGNOSIS — D509 Iron deficiency anemia, unspecified: Secondary | ICD-10-CM | POA: Diagnosis not present

## 2017-02-21 DIAGNOSIS — Z9049 Acquired absence of other specified parts of digestive tract: Secondary | ICD-10-CM | POA: Insufficient documentation

## 2017-02-21 DIAGNOSIS — Z8619 Personal history of other infectious and parasitic diseases: Secondary | ICD-10-CM | POA: Diagnosis not present

## 2017-02-21 DIAGNOSIS — I10 Essential (primary) hypertension: Secondary | ICD-10-CM | POA: Diagnosis present

## 2017-02-21 DIAGNOSIS — D696 Thrombocytopenia, unspecified: Secondary | ICD-10-CM | POA: Diagnosis not present

## 2017-02-21 DIAGNOSIS — J45909 Unspecified asthma, uncomplicated: Secondary | ICD-10-CM | POA: Diagnosis present

## 2017-02-21 DIAGNOSIS — E871 Hypo-osmolality and hyponatremia: Secondary | ICD-10-CM | POA: Diagnosis not present

## 2017-02-21 DIAGNOSIS — R632 Polyphagia: Secondary | ICD-10-CM | POA: Insufficient documentation

## 2017-02-21 DIAGNOSIS — Z8249 Family history of ischemic heart disease and other diseases of the circulatory system: Secondary | ICD-10-CM | POA: Insufficient documentation

## 2017-02-21 DIAGNOSIS — Z809 Family history of malignant neoplasm, unspecified: Secondary | ICD-10-CM | POA: Insufficient documentation

## 2017-02-21 DIAGNOSIS — E875 Hyperkalemia: Secondary | ICD-10-CM | POA: Diagnosis not present

## 2017-02-21 DIAGNOSIS — M722 Plantar fascial fibromatosis: Secondary | ICD-10-CM | POA: Insufficient documentation

## 2017-02-21 DIAGNOSIS — Z6830 Body mass index (BMI) 30.0-30.9, adult: Secondary | ICD-10-CM | POA: Diagnosis not present

## 2017-02-21 DIAGNOSIS — D61818 Other pancytopenia: Secondary | ICD-10-CM

## 2017-02-21 DIAGNOSIS — Z833 Family history of diabetes mellitus: Secondary | ICD-10-CM | POA: Insufficient documentation

## 2017-02-21 DIAGNOSIS — R358 Other polyuria: Secondary | ICD-10-CM | POA: Insufficient documentation

## 2017-02-21 DIAGNOSIS — Z7982 Long term (current) use of aspirin: Secondary | ICD-10-CM | POA: Diagnosis not present

## 2017-02-21 DIAGNOSIS — G9340 Encephalopathy, unspecified: Secondary | ICD-10-CM

## 2017-02-21 DIAGNOSIS — Z825 Family history of asthma and other chronic lower respiratory diseases: Secondary | ICD-10-CM | POA: Insufficient documentation

## 2017-02-21 LAB — BLOOD GAS, VENOUS
Acid-base deficit: 2.1 mmol/L — ABNORMAL HIGH (ref 0.0–2.0)
Bicarbonate: 23 mmol/L (ref 20.0–28.0)
Drawn by: 257881
O2 SAT: 69.6 %
PCO2 VEN: 42.5 mmHg — AB (ref 44.0–60.0)
PH VEN: 7.353 (ref 7.250–7.430)
PO2 VEN: 40.9 mmHg (ref 32.0–45.0)
Patient temperature: 98.6

## 2017-02-21 LAB — URINALYSIS, ROUTINE W REFLEX MICROSCOPIC
Bilirubin Urine: NEGATIVE
Glucose, UA: >=500 mg/dL — AB
LEUKOCYTES UA: NEGATIVE
NITRITE: NEGATIVE
PH: 5.5 (ref 5.0–8.0)
Protein, ur: NEGATIVE mg/dL

## 2017-02-21 LAB — CBC WITH DIFFERENTIAL/PLATELET
BASOS ABS: 0 10*3/uL (ref 0.0–0.1)
BASOS PCT: 0 %
EOS PCT: 0 %
Eosinophils Absolute: 0 10*3/uL (ref 0.0–0.7)
HEMATOCRIT: 35.1 % — AB (ref 36.0–46.0)
Hemoglobin: 11.3 g/dL — ABNORMAL LOW (ref 12.0–15.0)
LYMPHS PCT: 27 %
Lymphs Abs: 1 10*3/uL (ref 0.7–4.0)
MCH: 23.2 pg — ABNORMAL LOW (ref 26.0–34.0)
MCHC: 32.2 g/dL (ref 30.0–36.0)
MCV: 72.1 fL — AB (ref 78.0–100.0)
Monocytes Absolute: 0.2 10*3/uL (ref 0.1–1.0)
Monocytes Relative: 5 %
NEUTROS ABS: 2.4 10*3/uL (ref 1.7–7.7)
Neutrophils Relative %: 68 %
PLATELETS: 51 10*3/uL — AB (ref 150–400)
RBC: 4.87 MIL/uL (ref 3.87–5.11)
RDW: 13.4 % (ref 11.5–15.5)
WBC: 3.6 10*3/uL — ABNORMAL LOW (ref 4.0–10.5)

## 2017-02-21 LAB — URINALYSIS, MICROSCOPIC (REFLEX)
BACTERIA UA: NONE SEEN
RBC / HPF: NONE SEEN RBC/hpf (ref 0–5)

## 2017-02-21 LAB — I-STAT CHEM 8, ED
BUN: 41 mg/dL — AB (ref 6–20)
Calcium, Ion: 1.17 mmol/L (ref 1.15–1.40)
Chloride: 89 mmol/L — ABNORMAL LOW (ref 101–111)
Creatinine, Ser: 1.6 mg/dL — ABNORMAL HIGH (ref 0.44–1.00)
Glucose, Bld: >700 mg/dL (ref 65–99)
HEMATOCRIT: 41 % (ref 36.0–46.0)
Hemoglobin: 13.9 g/dL (ref 12.0–15.0)
Potassium: 5.5 mmol/L — ABNORMAL HIGH (ref 3.5–5.1)
Sodium: 125 mmol/L — ABNORMAL LOW (ref 135–145)
TCO2: 25 mmol/L (ref 22–32)

## 2017-02-21 LAB — COMPREHENSIVE METABOLIC PANEL
ALBUMIN: 3.9 g/dL (ref 3.5–5.0)
ALT: 22 U/L (ref 14–54)
AST: 27 U/L (ref 15–41)
Alkaline Phosphatase: 123 U/L (ref 38–126)
Anion gap: 15 (ref 5–15)
BUN: 34 mg/dL — ABNORMAL HIGH (ref 6–20)
CALCIUM: 9.3 mg/dL (ref 8.9–10.3)
CO2: 22 mmol/L (ref 22–32)
CREATININE: 1.72 mg/dL — AB (ref 0.44–1.00)
Chloride: 87 mmol/L — ABNORMAL LOW (ref 101–111)
GFR calc Af Amer: 35 mL/min — ABNORMAL LOW (ref >60)
GFR, EST NON AFRICAN AMERICAN: 30 mL/min — AB (ref >60)
GLUCOSE: 893 mg/dL — AB (ref 65–99)
POTASSIUM: 5.5 mmol/L — AB (ref 3.5–5.1)
Sodium: 124 mmol/L — ABNORMAL LOW (ref 135–145)
Total Bilirubin: 0.9 mg/dL (ref 0.3–1.2)
Total Protein: 7.1 g/dL (ref 6.5–8.1)

## 2017-02-21 LAB — IRON AND TIBC
Iron: 42 ug/dL (ref 28–170)
SATURATION RATIOS: 12 % (ref 10.4–31.8)
TIBC: 339 ug/dL (ref 250–450)
UIBC: 297 ug/dL

## 2017-02-21 LAB — I-STAT TROPONIN, ED: Troponin i, poc: 0 ng/mL (ref 0.00–0.08)

## 2017-02-21 LAB — I-STAT CG4 LACTIC ACID, ED
LACTIC ACID, VENOUS: 2.79 mmol/L — AB (ref 0.5–1.9)
Lactic Acid, Venous: 1.75 mmol/L (ref 0.5–1.9)

## 2017-02-21 LAB — CBG MONITORING, ED
Glucose-Capillary: >600 mg/dL (ref 65–99)
Glucose-Capillary: 512 mg/dL (ref 65–99)
Glucose-Capillary: 454 mg/dL — ABNORMAL HIGH (ref 65–99)
Glucose-Capillary: 432 mg/dL — ABNORMAL HIGH (ref 65–99)

## 2017-02-21 LAB — OSMOLALITY: OSMOLALITY: 325 mosm/kg — AB (ref 275–295)

## 2017-02-21 LAB — FERRITIN: Ferritin: 410 ng/mL — ABNORMAL HIGH (ref 11–307)

## 2017-02-21 LAB — FOLATE: Folate: 28 ng/mL (ref >5.9)

## 2017-02-21 LAB — VITAMIN B12: Vitamin B-12: 1965 pg/mL — ABNORMAL HIGH (ref 180–914)

## 2017-02-21 LAB — GLUCOSE, CAPILLARY: GLUCOSE-CAPILLARY: 402 mg/dL — AB (ref 65–99)

## 2017-02-21 MED ORDER — SODIUM CHLORIDE 0.9 % IV BOLUS (SEPSIS)
1000.0000 mL | Freq: Once | INTRAVENOUS | Status: AC
  Administered 2017-02-21: 1000 mL via INTRAVENOUS

## 2017-02-21 MED ORDER — INSULIN ASPART 100 UNIT/ML ~~LOC~~ SOLN: 0.0000 [IU] | Freq: Every day | SUBCUTANEOUS | Status: DC

## 2017-02-21 MED ORDER — INSULIN ASPART PROT & ASPART (70-30 MIX) 100 UNIT/ML ~~LOC~~ SUSP
30.0000 [IU] | Freq: Two times a day (BID) | SUBCUTANEOUS | Status: DC
  Administered 2017-02-21 – 2017-02-22 (×2): 30 [IU] via SUBCUTANEOUS
  Filled 2017-02-21: qty 10

## 2017-02-21 MED ORDER — INSULIN ASPART 100 UNIT/ML ~~LOC~~ SOLN: 0.0000 [IU] | SUBCUTANEOUS | Status: DC

## 2017-02-21 MED ORDER — SODIUM CHLORIDE 0.9 % IV SOLN
Freq: Once | INTRAVENOUS | Status: AC
  Administered 2017-02-21: 20:00:00 via INTRAVENOUS

## 2017-02-21 MED ORDER — DEXTROSE-NACL 5-0.45 % IV SOLN: INTRAVENOUS | Status: DC

## 2017-02-21 MED ORDER — INSULIN ASPART 100 UNIT/ML ~~LOC~~ SOLN
0.0000 [IU] | Freq: Three times a day (TID) | SUBCUTANEOUS | Status: DC
  Administered 2017-02-22: 15 [IU] via SUBCUTANEOUS
  Administered 2017-02-22: 11 [IU] via SUBCUTANEOUS

## 2017-02-21 MED ORDER — PNEUMOCOCCAL VAC POLYVALENT 25 MCG/0.5ML IJ INJ
0.5000 mL | INJECTION | INTRAMUSCULAR | Status: AC
  Administered 2017-02-22: 0.5 mL via INTRAMUSCULAR
  Filled 2017-02-21: qty 0.5

## 2017-02-21 MED ORDER — ACETAMINOPHEN 650 MG RE SUPP: 650.0000 mg | Freq: Four times a day (QID) | RECTAL | Status: DC | PRN

## 2017-02-21 MED ORDER — ALBUTEROL SULFATE (2.5 MG/3ML) 0.083% IN NEBU: 2.5000 mg | INHALATION_SOLUTION | Freq: Four times a day (QID) | RESPIRATORY_TRACT | Status: DC | PRN

## 2017-02-21 MED ORDER — INSULIN ASPART 100 UNIT/ML ~~LOC~~ SOLN
8.0000 [IU] | Freq: Once | SUBCUTANEOUS | Status: AC
  Administered 2017-02-21: 8 [IU] via SUBCUTANEOUS

## 2017-02-21 MED ORDER — INFLUENZA VAC SPLIT HIGH-DOSE 0.5 ML IM SUSY
0.5000 mL | PREFILLED_SYRINGE | INTRAMUSCULAR | Status: AC
  Administered 2017-02-22: 0.5 mL via INTRAMUSCULAR
  Filled 2017-02-21: qty 0.5

## 2017-02-21 MED ORDER — ACETAMINOPHEN 325 MG PO TABS: 650.0000 mg | ORAL_TABLET | Freq: Four times a day (QID) | ORAL | Status: DC | PRN

## 2017-02-21 MED ORDER — INSULIN ASPART 100 UNIT/ML ~~LOC~~ SOLN: 10.0000 [IU] | Freq: Once | SUBCUTANEOUS | Status: DC

## 2017-02-21 MED ORDER — INSULIN ASPART 100 UNIT/ML ~~LOC~~ SOLN
10.0000 [IU] | Freq: Once | SUBCUTANEOUS | Status: AC
  Administered 2017-02-21: 10 [IU] via SUBCUTANEOUS
  Filled 2017-02-21: qty 1

## 2017-02-21 MED ORDER — INSULIN REGULAR HUMAN 100 UNIT/ML IJ SOLN
INTRAMUSCULAR | Status: DC
  Filled 2017-02-21: qty 1

## 2017-02-21 NOTE — ED Notes (Signed)
Date and time results received: 02/21/17  1742  Test: Blood Glucose  Critical Value: 512  Name of Provider Notified: Dr. Florene Glen, admitting physician  Orders Received? Or Actions Taken?: Orders Received - See Orders for details. Plan is to discontinue insulin drip, dextrose 0.45% fluids and new orders will be placed by physician.

## 2017-02-21 NOTE — ED Triage Notes (Signed)
Transported by [facility] from home. CBG read HI, EMS administered between 75-100 cc of NS en route. Patient has no physical complaints. Last administration of Novolin Insulin 70/30 (25 units) administered SQ at 7 am this morning.

## 2017-02-21 NOTE — ED Notes (Signed)
ED Provider at bedside. 

## 2017-02-21 NOTE — ED Provider Notes (Signed)
[facility] Provider Note   CSN: [id number] Arrival date & time: 02/21/17  1317     History   Chief Complaint Chief Complaint  Patient presents with  . Hyperglycemia    HPI Deborah Neal is a 66 y.o. female.  HPI   66 yo F with PMHx DM, HTN, HLD, here with weakness and confusion. Pt reports that over the past few days she has had polyuria, polydipsia, and now confusion. She was seen in the ED on 10/24 with hyperglycemia, for which she was given fluids and insulin. She felt mildly improved but has since felt worse. She endorses generalized fatigue. Per family, pt was confused this AM. She went to the bank and upon standing to go inside, became more confusion, "dazed" and walked out into traffic. She does not remember any of this. She did not lose consciousness. She feels improved but tired now. Denies any fever or chills. No recent fevers. No cough or SOB. Denies any focal numbness or weakness.  Past Medical History:  Diagnosis Date  . ALLERGIC RHINITIS 01/29/2007  . Arthritis   . ASTHMA 01/29/2007  . ASTHMA, WITH ACUTE EXACERBATION 06/24/2008  . BACK PAIN 01/29/2009  . CHEST PAIN 06/24/2008  . Family history of adverse reaction to anesthesia    Patients daughter has N/V after anesthesia  . GERD (gastroesophageal reflux disease)   . History of shingles   . HYPERLIPIDEMIA 01/29/2007  . HYPERTENSION 01/29/2007  . OSTEOPENIA 10/16/2007  . Type II or unspecified type diabetes mellitus without mention of complication, uncontrolled 11/29/2013    Patient Active Problem List   Diagnosis Date Noted  . Hyperglycemia 02/21/2017  . Acute metabolic encephalopathy 26/37/8588  . Type 2 diabetes mellitus with hyperglycemia ([name]) 02/21/2017  . Thrombocytopenia ([place]) 02/21/2017  . Pancytopenia ([place]) 02/21/2017  . History of asthma 02/21/2017  . Preventative health care 09/28/2016  . Dehydration   . Hyperglycemic crisis in diabetes mellitus ([place])  07/06/2016  . AKI (acute kidney injury) ([place]) 07/06/2016  . Hyperkalemia 07/06/2016  . S/P laparoscopic cholecystectomy 09/19/2015  . Abnormal chest CT 06/21/2014  . Diabetes mellitus type II, non insulin dependent ([place]) 11/29/2013  . Obesity, unspecified 09/19/2012  . Plantar fasciitis, bilateral 06/08/2011  . BACK PAIN 01/29/2009  . Osteopenia 10/16/2007  . Essential hypertension 01/29/2007  . Asthma 01/29/2007    Past Surgical History:  Procedure Laterality Date  . CHOLECYSTECTOMY N/A 09/19/2015   Procedure: LAPAROSCOPIC CHOLECYSTECTOMY WITH INTRAOPERATIVE CHOLANGIOGRAM;  Surgeon: Jackolyn Confer, MD;  Location: [facility];  Service: General;  Laterality: N/A;  . COLONOSCOPY    . TONSILLECTOMY  1956  . VIDEO BRONCHOSCOPY Bilateral 03/28/2014   Procedure: VIDEO BRONCHOSCOPY WITHOUT FLUORO;  Surgeon: Tanda Rockers, MD;  Location: [facility];  Service: Cardiopulmonary;  Laterality: Bilateral;    OB History    Gravida Para Term Preterm AB Living   '5 5 5     5   ' SAB TAB Ectopic Multiple Live Births           5       Home Medications    Prior to Admission medications   Medication Sig Start Date End Date Taking? Authorizing Provider  albuterol (PROVENTIL HFA;VENTOLIN HFA) 108 (90 Base) MCG/ACT inhaler Inhale 2 puffs into the lungs every 6 (six) hours as needed for wheezing or shortness of breath. 05/22/16  Yes Brunetta Jeans, PA-C  alendronate (FOSAMAX) 70 MG tablet TAKE ONE TABLET BY MOUTH ONCE EVERY 7 DAYS. TAKE  WITH  A  FULL  GLASS  OF  WATER  AND  ON  AN  EMPTY  STOMACH 02/18/15  Yes Biagio Borg, MD  amLODipine-benazepril (LOTREL) 5-20 MG capsule TAKE TWO CAPSULES BY MOUTH ONCE DAILY 06/01/16  Yes Biagio Borg, MD  aspirin 81 MG tablet Take 81 mg by mouth every morning.    Yes [provider]  cyclobenzaprine (FLEXERIL) 5 MG tablet 1-2 qhs prn 06/21/14  Yes Hendricks Limes, MD  hydrochlorothiazide (HYDRODIURIL) 25 MG tablet TAKE ONE TABLET BY MOUTH ONCE DAILY  06/01/16  Yes Biagio Borg, MD  insulin NPH-regular Human (NOVOLIN 70/30 RELION) (70-30) 100 UNIT/ML injection Inject 30 Units into the skin 2 (two) times daily with a meal. 01/20/17  Yes Biagio Borg, MD  Multiple Vitamin (MULTIVITAMIN WITH MINERALS) TABS tablet Take 1 tablet by mouth daily.   Yes [provider]  potassium chloride (K-DUR) 10 MEQ tablet Take 3 tablets (30 mEq total) by mouth daily. 03/26/16  Yes Biagio Borg, MD  rosuvastatin (CRESTOR) 20 MG tablet TAKE ONE TABLET BY MOUTH ONCE DAILY 06/01/16  Yes Biagio Borg, MD  Blood Glucose Monitoring Suppl (ONE TOUCH ULTRA 2) w/Device KIT Use as directed 07/27/16   Biagio Borg, MD  glucose blood (ONE TOUCH ULTRA TEST) test strip Use to check blood sugars three times a day 09/28/16   Biagio Borg, MD  Lancets MISC Use as directed four times daily 07/27/16   Biagio Borg, MD    Family History Family History  Problem Relation Age of Onset  . Cancer Mother        ? type   . Diabetes Father   . Hypertension Father   . Heart disease Father   . Asthma Father   . Asthma Daughter   . Colon cancer Neg Hx   . Esophageal cancer Neg Hx   . Rectal cancer Neg Hx   . Stomach cancer Neg Hx   . Breast cancer Neg Hx     Social History Social History  Substance Use Topics  . Smoking status: Former Smoker    Quit date: 09/07/2003  . Smokeless tobacco: Never Used  . Alcohol use No     Allergies   Patient has no known allergies.   Review of Systems Review of Systems  Constitutional: Positive for fatigue.  Endocrine: Positive for polyphagia and polyuria.  Neurological: Positive for weakness.  Psychiatric/Behavioral: Positive for confusion.  All other systems reviewed and are negative.    Physical Exam Updated Vital Signs BP 110/68   Pulse 73   Temp 99 F (37.2 C) (Oral)   Resp 17   Ht '5\' 4"'  (1.626 m)   Wt 86.2 kg (190 lb)   SpO2 97%   BMI 32.61 kg/m   Physical Exam  Constitutional: She is oriented to person,  place, and time. She appears well-developed and well-nourished. No distress.  HENT:  Head: Normocephalic and atraumatic.  Moderately dry MM  Eyes: Conjunctivae are normal.  Neck: Neck supple.  Cardiovascular: Normal rate, regular rhythm and normal heart sounds.  Exam reveals no friction rub.   No murmur heard. Pulmonary/Chest: Effort normal and breath sounds normal. No respiratory distress. She has no wheezes. She has no rales.  Abdominal: She exhibits no distension.  Musculoskeletal: She exhibits no edema.  Neurological: She is alert and oriented to person, place, and time. No cranial nerve deficit or sensory deficit. She exhibits normal muscle tone. GCS eye  subscore is 4. GCS verbal subscore is 5. GCS motor subscore is 6.  Skin: Skin is warm. Capillary refill takes less than 2 seconds.  Psychiatric: She has a normal mood and affect.  Nursing note and vitals reviewed.    ED Treatments / Results  Labs (all labs ordered are listed, but only abnormal results are displayed) Labs Reviewed  CBC WITH DIFFERENTIAL/PLATELET - Abnormal; Notable for the following:       Result Value   WBC 3.6 (*)    Hemoglobin 11.3 (*)    HCT 35.1 (*)    MCV 72.1 (*)    MCH 23.2 (*)    Platelets 51 (*)    All other components within normal limits  COMPREHENSIVE METABOLIC PANEL - Abnormal; Notable for the following:    Sodium 124 (*)    Potassium 5.5 (*)    Chloride 87 (*)    Glucose, Bld 893 (*)    BUN 34 (*)    Creatinine, Ser 1.72 (*)    GFR calc non Af Amer 30 (*)    GFR calc Af Amer 35 (*)    All other components within normal limits  URINALYSIS, ROUTINE W REFLEX MICROSCOPIC - Abnormal; Notable for the following:    Specific Gravity, Urine <1.005 (*)    Glucose, UA >=500 (*)    Hgb urine dipstick TRACE (*)    Ketones, ur TRACE (*)    All other components within normal limits  BLOOD GAS, VENOUS - Abnormal; Notable for the following:    pCO2, Ven 42.5 (*)    Acid-base deficit 2.1 (*)    All  other components within normal limits  URINALYSIS, MICROSCOPIC (REFLEX) - Abnormal; Notable for the following:    Squamous Epithelial / LPF 0-5 (*)    Non Squamous Epithelial PRESENT (*)    All other components within normal limits  CBG MONITORING, ED - Abnormal; Notable for the following:    Glucose-Capillary >600 (*)    All other components within normal limits  I-STAT CG4 LACTIC ACID, ED - Abnormal; Notable for the following:    Lactic Acid, Venous 2.79 (*)    All other components within normal limits  I-STAT CHEM 8, ED - Abnormal; Notable for the following:    Sodium 125 (*)    Potassium 5.5 (*)    Chloride 89 (*)    BUN 41 (*)    Creatinine, Ser 1.60 (*)    Glucose, Bld >700 (*)    All other components within normal limits  CBG MONITORING, ED - Abnormal; Notable for the following:    Glucose-Capillary >600 (*)    All other components within normal limits  CBG MONITORING, ED - Abnormal; Notable for the following:    Glucose-Capillary 512 (*)    All other components within normal limits  CBG MONITORING, ED - Abnormal; Notable for the following:    Glucose-Capillary 454 (*)    All other components within normal limits  CBG MONITORING, ED - Abnormal; Notable for the following:    Glucose-Capillary 432 (*)    All other components within normal limits  OSMOLALITY  HEMOGLOBIN A1C  IRON AND TIBC  FERRITIN  VITAMIN B12  FOLATE  COMPREHENSIVE METABOLIC PANEL  CBC  I-STAT TROPONIN, ED  I-STAT CG4 LACTIC ACID, ED  CBG MONITORING, ED    EKG  EKG Interpretation  Date/Time:  Monday February 21 2017 13:36:59 EDT Ventricular Rate:  89 PR Interval:    QRS Duration: 90 QT Interval:  364 QTC Calculation: 443 R Axis:   -29 Text Interpretation:  Sinus rhythm Borderline left axis deviation Low voltage, extremity and precordial leads Baseline wander in lead(s) III aVF No significant change since last tracing Confirmed by Duffy Bruce [id number]) on 02/21/2017 1:56:53 PM Also  confirmed by Duffy Bruce [phone]), editor Philomena Doheny [id number])  on 02/21/2017 1:59:50 PM       Radiology Dg Chest 2 View  Result Date: 02/21/2017 CLINICAL DATA:  Low LEFT rare EXAM: CHEST  2 VIEW COMPARISON:  Chest radiograph 03/11/2014, CT 03/11/2014 FINDINGS: There is chronic atelectasis in the RIGHT middle lobe. Lung and on the formed lung remains a and elongated nodule within the lateral aspect RIGHT middle lobe is unchanged from remote CT and radiograph. No effusion, infiltrate pneumothorax. No pulmonary edema. Chronic bronchitic markings are present. No acute osseous abnormality. IMPRESSION: 1. Chronic atelectasis and elongated nodule in the RIGHT middle lobe unchanged from comparison CT or chest radiograph. 2. Chronic bronchitic markings. No clear acute cardiopulmonary findings Electronically Signed   By: Suzy Bouchard M.D.   On: 02/21/2017 17:26   Ct Head Wo Contrast  Result Date: 02/21/2017 CLINICAL DATA:  Unexplained altered mental status EXAM: CT HEAD WITHOUT CONTRAST TECHNIQUE: Contiguous axial images were obtained from the base of the skull through the vertex without intravenous contrast. COMPARISON:  03/19/2014 FINDINGS: BRAIN: There is mild chronic sulcal and ventricular prominence consistent with superficial and central atrophy. No intraparenchymal hemorrhage, mass effect nor midline shift. Periventricular and subcortical white matter hypodensities consistent with chronic small vessel ischemic disease are identified. No acute large vascular territory infarcts. No abnormal extra-axial fluid collections. Basal cisterns are not effaced and midline. VASCULAR: Mild-to-moderate calcific atherosclerosis of the carotid siphons. No hyperdense vessels. SKULL: No skull fracture. No significant scalp soft tissue swelling. SINUSES/ORBITS: The mastoid air-cells are clear. The included paranasal sinuses are well-aerated.The included ocular globes and orbital contents are non-suspicious. OTHER:  None. IMPRESSION: Atrophy with chronic small vessel ischemia. No acute intracranial abnormality. Electronically Signed   By: Ashley Royalty M.D.   On: 02/21/2017 17:20    Procedures .Critical Care Performed by: Duffy Bruce Authorized by: Duffy Bruce   Critical care provider statement:    Critical care time (minutes):  35   Critical care time was exclusive of:  Separately billable procedures and treating other patients and teaching time   Critical care was necessary to treat or prevent imminent or life-threatening deterioration of the following conditions:  Dehydration, endocrine crisis and metabolic crisis   Critical care was time spent personally by me on the following activities:  Development of treatment plan with patient or surrogate, discussions with consultants, evaluation of patient's response to treatment, examination of patient, obtaining history from patient or surrogate, ordering and performing treatments and interventions, ordering and review of laboratory studies, ordering and review of radiographic studies, pulse oximetry, re-evaluation of patient's condition and review of old charts   I assumed direction of critical care for this patient from another provider in my specialty: no     (including critical care time)  Medications Ordered in ED Medications  insulin aspart protamine- aspart (NOVOLOG MIX 70/30) injection 30 Units (not administered)  pneumococcal 23 valent vaccine (PNU-IMMUNE) injection 0.5 mL (not administered)  Influenza vac split quadrivalent PF (FLUZONE HIGH-DOSE) injection 0.5 mL (not administered)  albuterol (PROVENTIL HFA;VENTOLIN HFA) 108 (90 Base) MCG/ACT inhaler 2 puff (not administered)  acetaminophen (TYLENOL) tablet 650 mg (not administered)    Or  acetaminophen (TYLENOL) suppository 650 mg (  not administered)  insulin aspart (novoLOG) injection 0-15 Units (not administered)  sodium chloride 0.9 % bolus 1,000 mL (0 mLs Intravenous Stopped 02/21/17  1553)  sodium chloride 0.9 % bolus 1,000 mL (0 mLs Intravenous Stopped 02/21/17 1702)  insulin aspart (novoLOG) injection 10 Units (10 Units Subcutaneous Given 02/21/17 1441)  0.9 %  sodium chloride infusion ( Intravenous New Bag/Given 02/21/17 1945)     Initial Impression / Assessment and Plan / ED Course  I have reviewed the triage vital signs and the nursing notes.  Pertinent labs & imaging results that were available during my care of the patient were reviewed by me and considered in my medical decision making (see chart for details).     66 yo M with PMHx of HTN, HLD, DM here with confusion, marked hyperglycemia. Lab work shows Glu 893, mild AKI, and K 5.5. Suspect possible mild, early HHS. PH, AG and bicarb normal - no signs of DKA. However, given her reported AMS and degree of hyperglycemia, will start on insulin drip, admit to hospitalist. No signs of infectious etiology. No signs of ischemia. Exam is not c/w acute stroke and she is outside of tPA window.  Final Clinical Impressions(s) / ED Diagnoses   Final diagnoses:  Hyperglycemia  Encephalopathy  AKI (acute kidney injury) [facility])  Dehydration    New Prescriptions Current Discharge Medication List       Duffy Bruce, MD 02/21/17 2055

## 2017-02-21 NOTE — ED Notes (Signed)
ED TO INPATIENT HANDOFF REPORT  Name/Age/Gender Deborah Neal 66 y.o. female  Code Status Code Status History    Date Active Date Inactive Code Status Order ID Comments User Context   07/06/2016  9:05 PM 07/09/2016  6:01 PM Full Code [id number]  Vianne Bulls, MD ED      Home/SNF/Other Home  Chief Complaint Hyperglycemia  Level of Care/Admitting Diagnosis ED Disposition    ED Disposition Condition [facility] Area: [facility] [[id number]]  Level of Care: Telemetry [5]  Admit to tele based on following criteria: Other see comments  Comments: dehydration  Diagnosis: Hyperglycemia [160737]  Admitting Physician: Elodia Florence [phone]  Attending Physician: Cephus Slater, A CALDWELL [phone]  PT Class (Do Not Modify): Observation [104]  PT Acc Code (Do Not Modify): Observation [10022]       Medical History Past Medical History:  Diagnosis Date  . ALLERGIC RHINITIS 01/29/2007  . Arthritis   . ASTHMA 01/29/2007  . ASTHMA, WITH ACUTE EXACERBATION 06/24/2008  . BACK PAIN 01/29/2009  . CHEST PAIN 06/24/2008  . Family history of adverse reaction to anesthesia    Patients daughter has N/V after anesthesia  . GERD (gastroesophageal reflux disease)   . History of shingles   . HYPERLIPIDEMIA 01/29/2007  . HYPERTENSION 01/29/2007  . OSTEOPENIA 10/16/2007  . Type II or unspecified type diabetes mellitus without mention of complication, uncontrolled 11/29/2013    Allergies No Known Allergies  IV Location/Drains/Wounds Patient Lines/Drains/Airways Status   Active Line/Drains/Airways    Name:   Placement date:   Placement time:   Site:   Days:   Peripheral IV 02/21/17 Left Antecubital  02/21/17    1327    Antecubital    less than 1   Peripheral IV 02/21/17 Right Antecubital  02/21/17    1841    Antecubital    less than 1          Labs/Imaging Results for orders placed or performed during the hospital encounter of 02/21/17 (from the past 48  hour(s))  CBG monitoring, ED     Status: Abnormal   Collection Time: 02/21/17  1:24 PM  Result Value Ref Range   Glucose-Capillary >600 (HH) 65 - 99 mg/dL  CBC with Differential     Status: Abnormal   Collection Time: 02/21/17  1:44 PM  Result Value Ref Range   WBC 3.6 (L) 4.0 - 10.5 K/uL   RBC 4.87 3.87 - 5.11 MIL/uL   Hemoglobin 11.3 (L) 12.0 - 15.0 g/dL   HCT 35.1 (L) 36.0 - 46.0 %   MCV 72.1 (L) 78.0 - 100.0 fL   MCH 23.2 (L) 26.0 - 34.0 pg   MCHC 32.2 30.0 - 36.0 g/dL   RDW 13.4 11.5 - 15.5 %   Platelets 51 (L) 150 - 400 K/uL    Comment: PLATELET COUNT CONFIRMED BY SMEAR   Neutrophils Relative % 68 %   Neutro Abs 2.4 1.7 - 7.7 K/uL   Lymphocytes Relative 27 %   Lymphs Abs 1.0 0.7 - 4.0 K/uL   Monocytes Relative 5 %   Monocytes Absolute 0.2 0.1 - 1.0 K/uL   Eosinophils Relative 0 %   Eosinophils Absolute 0.0 0.0 - 0.7 K/uL   Basophils Relative 0 %   Basophils Absolute 0.0 0.0 - 0.1 K/uL  Comprehensive metabolic panel     Status: Abnormal   Collection Time: 02/21/17  1:44 PM  Result Value Ref Range   Sodium  124 (L) 135 - 145 mmol/L   Potassium 5.5 (H) 3.5 - 5.1 mmol/L    Comment: SLIGHT HEMOLYSIS   Chloride 87 (L) 101 - 111 mmol/L   CO2 22 22 - 32 mmol/L   Glucose, Bld 893 (HH) 65 - 99 mg/dL    Comment: CRITICAL RESULT CALLED TO, READ BACK BY AND VERIFIED WITH: HANKS,K. RN AT 1437 02/21/17 MULLINS,T    BUN 34 (H) 6 - 20 mg/dL   Creatinine, Ser 1.72 (H) 0.44 - 1.00 mg/dL   Calcium 9.3 8.9 - 10.3 mg/dL   Total Protein 7.1 6.5 - 8.1 g/dL   Albumin 3.9 3.5 - 5.0 g/dL   AST 27 15 - 41 U/L   ALT 22 14 - 54 U/L   Alkaline Phosphatase 123 38 - 126 U/L   Total Bilirubin 0.9 0.3 - 1.2 mg/dL   GFR calc non Af Amer 30 (L) >60 mL/min   GFR calc Af Amer 35 (L) >60 mL/min    Comment: (NOTE) The eGFR has been calculated using the CKD EPI equation. This calculation has not been validated in all clinical situations. eGFR's persistently <60 mL/min signify possible Chronic  Kidney Disease.    Anion gap 15 5 - 15  Urinalysis, Routine w reflex microscopic     Status: Abnormal   Collection Time: 02/21/17  1:45 PM  Result Value Ref Range   Color, Urine YELLOW YELLOW   APPearance CLEAR CLEAR   Specific Gravity, Urine <1.005 (L) 1.005 - 1.030   pH 5.5 5.0 - 8.0   Glucose, UA >=500 (A) NEGATIVE mg/dL   Hgb urine dipstick TRACE (A) NEGATIVE   Bilirubin Urine NEGATIVE NEGATIVE   Ketones, ur TRACE (A) NEGATIVE mg/dL   Protein, ur NEGATIVE NEGATIVE mg/dL   Nitrite NEGATIVE NEGATIVE   Leukocytes, UA NEGATIVE NEGATIVE  Urinalysis, Microscopic (reflex)     Status: Abnormal   Collection Time: 02/21/17  1:45 PM  Result Value Ref Range   RBC / HPF NONE SEEN 0 - 5 RBC/hpf   WBC, UA 0-5 0 - 5 WBC/hpf   Bacteria, UA NONE SEEN NONE SEEN   Squamous Epithelial / LPF 0-5 (A) NONE SEEN   Non Squamous Epithelial PRESENT (A) NONE SEEN   Hyaline Casts, UA PRESENT   I-Stat Troponin, ED (not at [facility])     Status: None   Collection Time: 02/21/17  2:13 PM  Result Value Ref Range   Troponin i, poc 0.00 0.00 - 0.08 ng/mL   Comment 3            Comment: Due to the release kinetics of cTnI, a negative result within the first hours of the onset of symptoms does not rule out myocardial infarction with certainty. If myocardial infarction is still suspected, repeat the test at appropriate intervals.   I-Stat CG4 Lactic Acid, ED     Status: Abnormal   Collection Time: 02/21/17  2:14 PM  Result Value Ref Range   Lactic Acid, Venous 2.79 (HH) 0.5 - 1.9 mmol/L   Comment NOTIFIED PHYSICIAN   I-Stat Chem 8, ED     Status: Abnormal   Collection Time: 02/21/17  2:15 PM  Result Value Ref Range   Sodium 125 (L) 135 - 145 mmol/L   Potassium 5.5 (H) 3.5 - 5.1 mmol/L   Chloride 89 (L) 101 - 111 mmol/L   BUN 41 (H) 6 - 20 mg/dL   Creatinine, Ser 1.60 (H) 0.44 - 1.00 mg/dL   Glucose, Bld >700 (HH)  65 - 99 mg/dL   Calcium, Ion 1.17 1.15 - 1.40 mmol/L   TCO2 25 22 - 32 mmol/L   Hemoglobin  13.9 12.0 - 15.0 g/dL   HCT 41.0 36.0 - 46.0 %   Comment NOTIFIED PHYSICIAN   Blood gas, venous     Status: Abnormal   Collection Time: 02/21/17  2:19 PM  Result Value Ref Range   pH, Ven 7.353 7.250 - 7.430   pCO2, Ven 42.5 (L) 44.0 - 60.0 mmHg   pO2, Ven 40.9 32.0 - 45.0 mmHg   Bicarbonate 23.0 20.0 - 28.0 mmol/L   Acid-base deficit 2.1 (H) 0.0 - 2.0 mmol/L   O2 Saturation 69.6 %   Patient temperature 98.6    Drawn by [id number]    Sample type DRAWN BY RN   POC CBG, ED     Status: Abnormal   Collection Time: 02/21/17  3:52 PM  Result Value Ref Range   Glucose-Capillary >600 (HH) 65 - 99 mg/dL  I-Stat CG4 Lactic Acid, ED     Status: None   Collection Time: 02/21/17  5:29 PM  Result Value Ref Range   Lactic Acid, Venous 1.75 0.5 - 1.9 mmol/L  POC CBG, ED     Status: Abnormal   Collection Time: 02/21/17  5:35 PM  Result Value Ref Range   Glucose-Capillary 512 (HH) 65 - 99 mg/dL  POC CBG, ED     Status: Abnormal   Collection Time: 02/21/17  7:09 PM  Result Value Ref Range   Glucose-Capillary 454 (H) 65 - 99 mg/dL  POC CBG, ED     Status: Abnormal   Collection Time: 02/21/17  8:02 PM  Result Value Ref Range   Glucose-Capillary 432 (H) 65 - 99 mg/dL   Comment 1 Notify RN    Comment 2 Document in Chart    Dg Chest 2 View  Result Date: 02/21/2017 CLINICAL DATA:  Low LEFT rare EXAM: CHEST  2 VIEW COMPARISON:  Chest radiograph 03/11/2014, CT 03/11/2014 FINDINGS: There is chronic atelectasis in the RIGHT middle lobe. Lung and on the formed lung remains a and elongated nodule within the lateral aspect RIGHT middle lobe is unchanged from remote CT and radiograph. No effusion, infiltrate pneumothorax. No pulmonary edema. Chronic bronchitic markings are present. No acute osseous abnormality. IMPRESSION: 1. Chronic atelectasis and elongated nodule in the RIGHT middle lobe unchanged from comparison CT or chest radiograph. 2. Chronic bronchitic markings. No clear acute cardiopulmonary findings  Electronically Signed   By: Suzy Bouchard M.D.   On: 02/21/2017 17:26   Ct Head Wo Contrast  Result Date: 02/21/2017 CLINICAL DATA:  Unexplained altered mental status EXAM: CT HEAD WITHOUT CONTRAST TECHNIQUE: Contiguous axial images were obtained from the base of the skull through the vertex without intravenous contrast. COMPARISON:  03/19/2014 FINDINGS: BRAIN: There is mild chronic sulcal and ventricular prominence consistent with superficial and central atrophy. No intraparenchymal hemorrhage, mass effect nor midline shift. Periventricular and subcortical white matter hypodensities consistent with chronic small vessel ischemic disease are identified. No acute large vascular territory infarcts. No abnormal extra-axial fluid collections. Basal cisterns are not effaced and midline. VASCULAR: Mild-to-moderate calcific atherosclerosis of the carotid siphons. No hyperdense vessels. SKULL: No skull fracture. No significant scalp soft tissue swelling. SINUSES/ORBITS: The mastoid air-cells are clear. The included paranasal sinuses are well-aerated.The included ocular globes and orbital contents are non-suspicious. OTHER: None. IMPRESSION: Atrophy with chronic small vessel ischemia. No acute intracranial abnormality. Electronically Signed   By: Ashley Royalty  M.D.   On: 02/21/2017 17:20    Pending Labs Unresulted Labs    Start     Ordered   02/21/17 1820  Iron and TIBC  Add-on,   R     02/21/17 1819   02/21/17 1820  Ferritin  Add-on,   R     02/21/17 1819   02/21/17 1820  Vitamin B12  Add-on,   R     02/21/17 1819   02/21/17 1820  Folate  Add-on,   R     02/21/17 1819   02/21/17 1816  Hemoglobin A1c  Add-on,   R     02/21/17 1815   02/21/17 1656  Osmolality  Add-on,   R     02/21/17 1655   Signed and Held  Comprehensive metabolic panel  Tomorrow morning,   R     Signed and Held   Signed and Held  CBC  Tomorrow morning,   R     Signed and Held      Vitals/Pain Today's Vitals   02/21/17 1553  02/21/17 1919 02/21/17 1920 02/21/17 2000  BP: 107/65  (!) 96/56 110/68  Pulse: 71  77 73  Resp: _0 Temp:   99 F (37.2 C)   TempSrc:   Oral   SpO2: 100%  98% 97%  Weight:  190 lb (86.2 kg)    Height:  5' 4" (1.626 m)      Isolation Precautions No active isolations  Medications Medications  dextrose 5 %-0.45 % sodium chloride infusion ( Intravenous Not Given 02/21/17 1745)  insulin regular (NOVOLIN R,HUMULIN R) 100 Units in sodium chloride 0.9 % 100 mL (1 Units/mL) infusion ( Intravenous Not Given 02/21/17 1745)  insulin aspart protamine- aspart (NOVOLOG MIX 70/30) injection 30 Units (not administered)  pneumococcal 23 valent vaccine (PNU-IMMUNE) injection 0.5 mL (not administered)  Influenza vac split quadrivalent PF (FLUZONE HIGH-DOSE) injection 0.5 mL (not administered)  insulin aspart (novoLOG) injection 0-15 Units (not administered)  sodium chloride 0.9 % bolus 1,000 mL (0 mLs Intravenous Stopped 02/21/17 1553)  sodium chloride 0.9 % bolus 1,000 mL (0 mLs Intravenous Stopped 02/21/17 1702)  insulin aspart (novoLOG) injection 10 Units (10 Units Subcutaneous Given 02/21/17 1441)  0.9 %  sodium chloride infusion ( Intravenous New Bag/Given 02/21/17 1945)    Mobility walks

## 2017-02-21 NOTE — H&P (Signed)
History and Physical    Deborah Neal:[id number] DOB: 1950/09/13 DOA: 02/21/2017  PCP: Biagio Borg, MD   Patient coming from: home  I have personally briefly reviewed patient's old medical records in [place]  Chief Complaint: confusion, hyperglycemia  HPI: Deborah Neal is a 66 y.o. female with medical history significant of type 2 diabetes, hypertension, asthma, osteopenia presenting with confusion and hyperglycemia.  History was obtained with assistance from the patient's daughter as the patient does not remember much from earlier today.  Her daughter notes that around 10 there are running errands. She noticed that her mother was walking slowly and looking confused. At home she asked her to hand or something, but she ended her the wrong thing. She then started walking towards the street and almost passed out. As a result brought her into the hospital.  [name] denies any fevers, chills, cough, cold, dysuria, abdominal pain, vomiting. She does note that she had some nausea. At this point in time she feels normal and does not have any other complaints. She notes compliance with her insulin regimen, she notes that she takes 25 units twice a day of 70/30 notes compliance with this. This is not clear, her daughter notes blood sugars as high as 500 recently at home yesterday.  She was seen in the ED last week for hyperglycemia as well.  ED Course: In emergency department she was found to be hyperglycemic. She was given 10 units of regular insulin. Head CT. Labs. EKG. Bolus. Admit for hyperglycemia and confusion.  Review of Systems: As per HPI otherwise 10 point review of systems negative.   Past Medical History:  Diagnosis Date  . ALLERGIC RHINITIS 01/29/2007  . Arthritis   . ASTHMA 01/29/2007  . ASTHMA, WITH ACUTE EXACERBATION 06/24/2008  . BACK PAIN 01/29/2009  . CHEST PAIN 06/24/2008  . Family history of adverse reaction to anesthesia    Patients daughter has N/V after  anesthesia  . GERD (gastroesophageal reflux disease)   . History of shingles   . HYPERLIPIDEMIA 01/29/2007  . HYPERTENSION 01/29/2007  . OSTEOPENIA 10/16/2007  . Type II or unspecified type diabetes mellitus without mention of complication, uncontrolled 11/29/2013    Past Surgical History:  Procedure Laterality Date  . CHOLECYSTECTOMY N/A 09/19/2015   Procedure: LAPAROSCOPIC CHOLECYSTECTOMY WITH INTRAOPERATIVE CHOLANGIOGRAM;  Surgeon: Jackolyn Confer, MD;  Location: [facility];  Service: General;  Laterality: N/A;  . COLONOSCOPY    . TONSILLECTOMY  1956  . VIDEO BRONCHOSCOPY Bilateral 03/28/2014   Procedure: VIDEO BRONCHOSCOPY WITHOUT FLUORO;  Surgeon: Tanda Rockers, MD;  Location: [facility];  Service: Cardiopulmonary;  Laterality: Bilateral;     reports that she quit smoking about 13 years ago. She has never used smokeless tobacco. She reports that she does not drink alcohol or use drugs.  No Known Allergies  Family History  Problem Relation Age of Onset  . Cancer Mother        ? type   . Diabetes Father   . Hypertension Father   . Heart disease Father   . Asthma Father   . Asthma Daughter   . Colon cancer Neg Hx   . Esophageal cancer Neg Hx   . Rectal cancer Neg Hx   . Stomach cancer Neg Hx   . Breast cancer Neg Hx     Prior to Admission medications   Medication Sig Start Date End Date Taking? Authorizing Provider  albuterol (PROVENTIL HFA;VENTOLIN HFA) 108 (90 Base) MCG/ACT  inhaler Inhale 2 puffs into the lungs every 6 (six) hours as needed for wheezing or shortness of breath. 05/22/16  Yes Brunetta Jeans, PA-C  alendronate (FOSAMAX) 70 MG tablet TAKE ONE TABLET BY MOUTH ONCE EVERY 7 DAYS. TAKE  WITH  A  FULL  GLASS  OF  WATER  AND  ON  AN  EMPTY  STOMACH 02/18/15  Yes Biagio Borg, MD  amLODipine-benazepril (LOTREL) 5-20 MG capsule TAKE TWO CAPSULES BY MOUTH ONCE DAILY 06/01/16  Yes Biagio Borg, MD  aspirin 81 MG tablet Take 81 mg by mouth every morning.    Yes [provider]  cyclobenzaprine (FLEXERIL) 5 MG tablet 1-2 qhs prn 06/21/14  Yes Hendricks Limes, MD  hydrochlorothiazide (HYDRODIURIL) 25 MG tablet TAKE ONE TABLET BY MOUTH ONCE DAILY 06/01/16  Yes Biagio Borg, MD  insulin NPH-regular Human (NOVOLIN 70/30 RELION) (70-30) 100 UNIT/ML injection Inject 30 Units into the skin 2 (two) times daily with a meal. 01/20/17  Yes Biagio Borg, MD  Multiple Vitamin (MULTIVITAMIN WITH MINERALS) TABS tablet Take 1 tablet by mouth daily.   Yes [provider]  potassium chloride (K-DUR) 10 MEQ tablet Take 3 tablets (30 mEq total) by mouth daily. 03/26/16  Yes Biagio Borg, MD  rosuvastatin (CRESTOR) 20 MG tablet TAKE ONE TABLET BY MOUTH ONCE DAILY 06/01/16  Yes Biagio Borg, MD  Blood Glucose Monitoring Suppl (ONE TOUCH ULTRA 2) w/Device KIT Use as directed 07/27/16   Biagio Borg, MD  glucose blood (ONE TOUCH ULTRA TEST) test strip Use to check blood sugars three times a day 09/28/16   Biagio Borg, MD  Lancets MISC Use as directed four times daily 07/27/16   Biagio Borg, MD    Physical Exam: Vitals:   02/21/17 1330 02/21/17 1553  BP: 103/62 107/65  Pulse: 90 71  Resp: (!) 25 19  Temp: 98.6 F (37 C)   TempSrc: Oral   SpO2: 99% 100%    Constitutional: NAD, calm, comfortable Vitals:   02/21/17 1330 02/21/17 1553  BP: 103/62 107/65  Pulse: 90 71  Resp: (!) 25 19  Temp: 98.6 F (37 C)   TempSrc: Oral   SpO2: 99% 100%   Eyes: PERRL, lids and conjunctivae normal ENMT: Mucous membranes are moist. Posterior pharynx clear of any exudate or lesions.Normal dentition.  Neck: normal, supple, no masses, no thyromegaly Respiratory: clear to auscultation bilaterally, no wheezing, no crackles. Normal respiratory effort. No accessory muscle use.  Cardiovascular: Regular rate and rhythm, no murmurs / rubs / gallops. No extremity edema. 2+ pedal pulses. No carotid bruits.  Abdomen: no tenderness, no masses palpated. No hepatosplenomegaly. Bowel sounds  positive.  Musculoskeletal: no clubbing / cyanosis. No joint deformity upper and lower extremities. Good ROM, no contractures. Normal muscle tone.  Skin: no rashes, lesions, ulcers. No induration Neurologic: CN 2-12 intact. Strength 5/5 in all 4. Sensation intact to light touch. Psychiatric: Normal judgment and insight. Alert and oriented x 3. Normal mood.   Labs on Admission: I have personally reviewed following labs and imaging studies  CBC:  Recent Labs Lab 02/16/17 1342 02/21/17 1344 02/21/17 1415  WBC 7.1 3.6*  --   NEUTROABS  --  2.4  --   HGB 12.8 11.3* 13.9  HCT 40.1 35.1* 41.0  MCV 73.3* 72.1*  --   PLT 216 51*  --    Basic Metabolic Panel:  Recent Labs Lab 02/16/17 1342 02/21/17 1344 02/21/17 1415  NA 129* 124* 125*  K 4.2 5.5* 5.5*  CL 90* 87* 89*  CO2 23 22  --   GLUCOSE 567* 893* >700*  BUN 41* 34* 41*  CREATININE 1.87* 1.72* 1.60*  CALCIUM 9.4 9.3  --    GFR: Estimated Creatinine Clearance: 36.7 mL/min (A) (by C-G formula based on SCr of 1.6 mg/dL (H)). Liver Function Tests:  Recent Labs Lab 02/21/17 1344  AST 27  ALT 22  ALKPHOS 123  BILITOT 0.9  PROT 7.1  ALBUMIN 3.9   No results for input(s): LIPASE, AMYLASE in the last 168 hours. No results for input(s): AMMONIA in the last 168 hours. Coagulation Profile: No results for input(s): INR, PROTIME in the last 168 hours. Cardiac Enzymes: No results for input(s): CKTOTAL, CKMB, CKMBINDEX, TROPONINI in the last 168 hours. BNP (last 3 results) No results for input(s): PROBNP in the last 8760 hours. HbA1C: No results for input(s): HGBA1C in the last 72 hours. CBG:  Recent Labs Lab 02/16/17 1345 02/16/17 1740 02/21/17 1324 02/21/17 1552 02/21/17 1735  GLUCAP 566* 425* >600* >600* 512*   Lipid Profile: No results for input(s): CHOL, HDL, LDLCALC, TRIG, CHOLHDL, LDLDIRECT in the last 72 hours. Thyroid Function Tests: No results for input(s): TSH, T4TOTAL, FREET4, T3FREE, THYROIDAB in  the last 72 hours. Anemia Panel: No results for input(s): VITAMINB12, FOLATE, FERRITIN, TIBC, IRON, RETICCTPCT in the last 72 hours. Urine analysis:    Component Value Date/Time   COLORURINE YELLOW 02/21/2017 [place] 02/21/2017 1345   LABSPEC <1.005 (L) 02/21/2017 1345   PHURINE 5.5 02/21/2017 1345   GLUCOSEU >=500 (A) 02/21/2017 1345   GLUCOSEU NEGATIVE 09/28/2016 1156   HGBUR TRACE (A) 02/21/2017 1345   BILIRUBINUR NEGATIVE 02/21/2017 1345   KETONESUR TRACE (A) 02/21/2017 1345   PROTEINUR NEGATIVE 02/21/2017 1345   UROBILINOGEN 0.2 09/28/2016 1156   NITRITE NEGATIVE 02/21/2017 1345   LEUKOCYTESUR NEGATIVE 02/21/2017 1345    Radiological Exams on Admission: Dg Chest 2 View  Result Date: 02/21/2017 CLINICAL DATA:  Low LEFT rare EXAM: CHEST  2 VIEW COMPARISON:  Chest radiograph 03/11/2014, CT 03/11/2014 FINDINGS: There is chronic atelectasis in the RIGHT middle lobe. Lung and on the formed lung remains a and elongated nodule within the lateral aspect RIGHT middle lobe is unchanged from remote CT and radiograph. No effusion, infiltrate pneumothorax. No pulmonary edema. Chronic bronchitic markings are present. No acute osseous abnormality. IMPRESSION: 1. Chronic atelectasis and elongated nodule in the RIGHT middle lobe unchanged from comparison CT or chest radiograph. 2. Chronic bronchitic markings. No clear acute cardiopulmonary findings Electronically Signed   By: Suzy Bouchard M.D.   On: 02/21/2017 17:26   Ct Head Wo Contrast  Result Date: 02/21/2017 CLINICAL DATA:  Unexplained altered mental status EXAM: CT HEAD WITHOUT CONTRAST TECHNIQUE: Contiguous axial images were obtained from the base of the skull through the vertex without intravenous contrast. COMPARISON:  03/19/2014 FINDINGS: BRAIN: There is mild chronic sulcal and ventricular prominence consistent with superficial and central atrophy. No intraparenchymal hemorrhage, mass effect nor midline shift.  Periventricular and subcortical white matter hypodensities consistent with chronic small vessel ischemic disease are identified. No acute large vascular territory infarcts. No abnormal extra-axial fluid collections. Basal cisterns are not effaced and midline. VASCULAR: Mild-to-moderate calcific atherosclerosis of the carotid siphons. No hyperdense vessels. SKULL: No skull fracture. No significant scalp soft tissue swelling. SINUSES/ORBITS: The mastoid air-cells are clear. The included paranasal sinuses are well-aerated.The included ocular globes and orbital contents are non-suspicious. OTHER:  None. IMPRESSION: Atrophy with chronic small vessel ischemia. No acute intracranial abnormality. Electronically Signed   By: Ashley Royalty M.D.   On: 02/21/2017 17:20    EKG: Independently reviewed. Appears similar to priors. Normal sinus rhythm.   Assessment/Plan Principal Problem:   Type 2 diabetes mellitus with hyperglycemia (HCC) Active Problems:   Essential hypertension   Asthma   Osteopenia   Hyperglycemia   Acute metabolic encephalopathy   Thrombocytopenia (HCC)   Pancytopenia (HCC)   History of asthma  Type 2 diabetes with hyperglycemia.  Normal anion gap, normal bicarbonate, trace ketones. Calculated osmolality about 310.  Status post 10 units regular insulin in ED. Blood sugar coming down well. Mental status appropriate. Not meeting criteria for DKA at this point, possibly mild HHS.  She endorses compliance, but sounds like her blood sugars have been uncontrolled at home with recent ED visit last week.  Change to 70/30 in April this year due to cost of Levemir. - serum osm pending - Home 70/30 at 30 units twice a day (which is increased dose from 25 units BID) - Sliding-scale insulin every 4 hours for now until tomorrow - Diabetic diet - Diabetic educator - Hemoglobin W4O  Acute metabolic encephalopathy:  2/2 above, now resolved.  Head CT with atrophy with chronic small vessel ischemia.  CXR  with chronic atelectasis and unchanged elongated nodule in R middle lobe.  UA without evidence of infection.   Hyponatremia: corrects due to hyperglycemia  Hyperkalemia:  Mild, no ekg changes, ctm repeat BMP tomorrow  Elevated Lactate: resolved, 2/2 dehydration with hyperglycemia most likely   Pancytopenia:  Repeat labs tomorrow, not previously known to have low platelets or WBC count.  Mild anemia chronic, microcytic.   Iron panel, B12, folate  Acute kidney injury: Secondary to dehydration with hyperglycemia, improving with IV fluids continue monitor. IVF  HTN: hold thiazide/ace with aki, hold amlodipine, ctm  History of asthma: prn albuterol HLD: holding statin Osteopenia: holding alendronate Holding flexeril  DVT prophylaxis: SCD with thrombocytopenia  Code Status: full  Family Communication: daughters at bedside  Disposition Plan: pending better BG control Consults called: none Admission status: obs    Fayrene Helper MD [facility] Pager [phone]  If 7PM-7AM, please contact night-coverage [email or web address] Password [email or web address]  02/21/2017, 6:11 PM

## 2017-02-21 NOTE — ED Notes (Signed)
Admitting Provider at bedside. 

## 2017-02-21 NOTE — ED Notes (Signed)
CALL REPORT TO Cel[place] @ [phone], [phone]

## 2017-02-22 DIAGNOSIS — Z794 Long term (current) use of insulin: Secondary | ICD-10-CM | POA: Diagnosis not present

## 2017-02-22 DIAGNOSIS — E1165 Type 2 diabetes mellitus with hyperglycemia: Secondary | ICD-10-CM

## 2017-02-22 LAB — COMPREHENSIVE METABOLIC PANEL
ALT: 19 U/L (ref 14–54)
ANION GAP: 8 (ref 5–15)
AST: 20 U/L (ref 15–41)
Albumin: 3.4 g/dL — ABNORMAL LOW (ref 3.5–5.0)
Alkaline Phosphatase: 96 U/L (ref 38–126)
BUN: 21 mg/dL — ABNORMAL HIGH (ref 6–20)
CHLORIDE: 102 mmol/L (ref 101–111)
CO2: 27 mmol/L (ref 22–32)
Calcium: 8.9 mg/dL (ref 8.9–10.3)
Creatinine, Ser: 1.07 mg/dL — ABNORMAL HIGH (ref 0.44–1.00)
GFR calc non Af Amer: 53 mL/min — ABNORMAL LOW (ref >60)
Glucose, Bld: 220 mg/dL — ABNORMAL HIGH (ref 65–99)
POTASSIUM: 3.7 mmol/L (ref 3.5–5.1)
SODIUM: 137 mmol/L (ref 135–145)
Total Bilirubin: 0.6 mg/dL (ref 0.3–1.2)
Total Protein: 6.7 g/dL (ref 6.5–8.1)

## 2017-02-22 LAB — GLUCOSE, CAPILLARY
GLUCOSE-CAPILLARY: 311 mg/dL — AB (ref 65–99)
Glucose-Capillary: 214 mg/dL — ABNORMAL HIGH (ref 65–99)
Glucose-Capillary: 330 mg/dL — ABNORMAL HIGH (ref 65–99)
Glucose-Capillary: 257 mg/dL — ABNORMAL HIGH (ref 65–99)
Glucose-Capillary: 321 mg/dL — ABNORMAL HIGH (ref 65–99)

## 2017-02-22 LAB — CBC
HEMATOCRIT: 35.2 % — AB (ref 36.0–46.0)
HEMOGLOBIN: 11.7 g/dL — AB (ref 12.0–15.0)
MCH: 24.2 pg — AB (ref 26.0–34.0)
MCHC: 33.2 g/dL (ref 30.0–36.0)
MCV: 72.7 fL — AB (ref 78.0–100.0)
Platelets: 180 10*3/uL (ref 150–400)
RBC: 4.84 MIL/uL (ref 3.87–5.11)
RDW: 13.7 % (ref 11.5–15.5)
WBC: 4.8 10*3/uL (ref 4.0–10.5)

## 2017-02-22 MED ORDER — INSULIN NPH ISOPHANE & REGULAR (70-30) 100 UNIT/ML ~~LOC~~ SUSP: 30.0000 [IU] | Freq: Two times a day (BID) | SUBCUTANEOUS | 1 refills | Status: DC

## 2017-02-22 MED ORDER — LIVING WELL WITH DIABETES BOOK
Freq: Once | Status: AC
  Administered 2017-02-22: 12:00:00
  Filled 2017-02-22: qty 1

## 2017-02-22 NOTE — Care Management Obs Status (Signed)
[facility] NOTIFICATION   Patient Details  Name: Deborah Neal MRN: [id number] Date of Birth: Jul 19, 1950   Medicare Observation Status Notification Given:  Yes    Purcell Mouton, RN 02/22/2017, 4:16 PM

## 2017-02-22 NOTE — Discharge Summary (Signed)
Physician Discharge Summary  FANCHON PAPANIA YBW:[id number] DOB: Jul 24, 1950 DOA: 02/21/2017  PCP: Biagio Borg, MD  Admit date: 02/21/2017 Discharge date: 02/22/2017  Recommendations for Outpatient Follow-up:  1. Continue current insulin regimen   Discharge Diagnoses:  Principal Problem:   Type 2 diabetes mellitus with hyperglycemia (HCC) Active Problems:   Essential hypertension   Asthma   Osteopenia   AKI (acute kidney injury) (HCC)   Hyperkalemia   Dehydration   Hyperglycemia   Acute metabolic encephalopathy   Thrombocytopenia (HCC)   Pancytopenia (HCC)   History of asthma    Discharge Condition: stable   Diet recommendation: as tolerated   History of present illness:   Per HPI "66 y.o. female with medical history significant of type 2 diabetes, hypertension, asthma, osteopenia presenting with confusion and hyperglycemia.  History was obtained with assistance from the patient's daughter as the patient does not remember much from earlier today.  Her daughter notes that around 10 there are running errands. She noticed that her mother was walking slowly and looking confused. At home she asked her to hand or something, but she ended her the wrong thing. She then started walking towards the street and almost passed out. As a result brought her into the hospital. Mrs. Riolo denies any fevers, chills, cough, cold, dysuria, abdominal pain, vomiting. She does note that she had some nausea. At this point in time she feels normal and does not have any other complaints. She notes compliance with her insulin regimen, she notes that she takes 25 units twice a day of 70/30 notes compliance with this. This is not clear, her daughter notes blood sugars as high as 500 recently at home yesterday. She was seen in the ED last week for hyperglycemia as well. ED Course: In emergency department she was found to be hyperglycemic. She was given 10 units of regular insulin. Head CT. Labs. EKG. Bolus.  Admit for hyperglycemia and confusion."  Hospital Course:   Principal Problem:   Type 2 diabetes mellitus with hyperglycemia (HCC) - CBG's in 200 now - Continue home insulin regimen - Encouraged compliance   Active Problems:   Acute metabolic encephalopathy - Due to hyperglycemia - Much better this am, alert and oriented to time, place and person     Hyponatremia - Due to hyperglycemia - Sodium improved with correction of hyperglycemia    Signed:  Leisa Lenz, MD  [facility] 02/22/2017, 12:17 PM  Pager #: [phone]  Time spent in minutes: more than 30 minutes  Procedures:  None  Consultations:  DM coordinator   Discharge Exam: Vitals:   02/22/17 0450 02/22/17 0822  BP: 112/63 (!) 122/59  Pulse: 64 70  Resp: 20 18  Temp: 97.8 F (36.6 C) 98 F (36.7 C)  SpO2: 97% 99%   Vitals:   02/21/17 2100 02/21/17 2108 02/22/17 0450 02/22/17 0822  BP:  122/65 112/63 (!) 122/59  Pulse:  71 64 70  Resp:  '18 20 18  ' Temp:  98.7 F (37.1 C) 97.8 F (36.6 C) 98 F (36.7 C)  TempSrc:  Oral Oral Oral  SpO2:  98% 97% 99%  Weight: 78.3 kg (172 lb 9.9 oz)     Height: '5\' 3"'  (1.6 m)       General: Pt is alert, follows commands appropriately, not in acute distress Cardiovascular: Regular rate and rhythm, S1/S2 (+) Respiratory: Clear to auscultation bilaterally, no wheezing, no crackles, no rhonchi Abdominal: Soft, non tender, non distended, bowel sounds +, no guarding  Extremities: no edema, no cyanosis, pulses palpable bilaterally DP and PT Neuro: Grossly nonfocal  Discharge Instructions  Discharge Instructions    Ambulatory referral to Nutrition and Diabetic Education    Complete by:  As directed    Call MD for:  difficulty breathing, headache or visual disturbances    Complete by:  As directed    Call MD for:  redness, tenderness, or signs of infection (pain, swelling, redness, odor or green/yellow discharge around incision site)    Complete by:  As  directed    Call MD for:  severe uncontrolled pain    Complete by:  As directed    Diet - low sodium heart healthy    Complete by:  As directed    Increase activity slowly    Complete by:  As directed      Allergies as of 02/22/2017   No Known Allergies     Medication List    TAKE these medications   albuterol 108 (90 Base) MCG/ACT inhaler Commonly known as:  PROVENTIL HFA;VENTOLIN HFA Inhale 2 puffs into the lungs every 6 (six) hours as needed for wheezing or shortness of breath.   alendronate 70 MG tablet Commonly known as:  FOSAMAX TAKE ONE TABLET BY MOUTH ONCE EVERY 7 DAYS. TAKE  WITH  A  FULL  GLASS  OF  WATER  AND  ON  AN  EMPTY  STOMACH   amLODipine-benazepril 5-20 MG capsule Commonly known as:  LOTREL TAKE TWO CAPSULES BY MOUTH ONCE DAILY   aspirin 81 MG tablet Take 81 mg by mouth every morning.   cyclobenzaprine 5 MG tablet Commonly known as:  FLEXERIL 1-2 qhs prn   glucose blood test strip Commonly known as:  ONE TOUCH ULTRA TEST Use to check blood sugars three times a day   hydrochlorothiazide 25 MG tablet Commonly known as:  HYDRODIURIL TAKE ONE TABLET BY MOUTH ONCE DAILY   insulin NPH-regular Human (70-30) 100 UNIT/ML injection Commonly known as:  NOVOLIN 70/30 RELION Inject 30 Units into the skin 2 (two) times daily with a meal.   Lancets Misc Use as directed four times daily   multivitamin with minerals Tabs tablet Take 1 tablet by mouth daily.   ONE TOUCH ULTRA 2 w/Device Kit Use as directed   potassium chloride 10 MEQ tablet Commonly known as:  K-DUR Take 3 tablets (30 mEq total) by mouth daily.   rosuvastatin 20 MG tablet Commonly known as:  CRESTOR TAKE ONE TABLET BY MOUTH ONCE DAILY      Follow-up Information    Biagio Borg, MD. Schedule an appointment as soon as possible for a visit.   Specialties:  Internal Medicine, Radiology Contact information: [address] [place] [address] [phone]             The results of significant diagnostics from this hospitalization (including imaging, microbiology, ancillary and laboratory) are listed below for reference.    Significant Diagnostic Studies: Dg Chest 2 View  Result Date: 02/21/2017 CLINICAL DATA:  Low LEFT rare EXAM: CHEST  2 VIEW COMPARISON:  Chest radiograph 03/11/2014, CT 03/11/2014 FINDINGS: There is chronic atelectasis in the RIGHT middle lobe. Lung and on the formed lung remains a and elongated nodule within the lateral aspect RIGHT middle lobe is unchanged from remote CT and radiograph. No effusion, infiltrate pneumothorax. No pulmonary edema. Chronic bronchitic markings are present. No acute osseous abnormality. IMPRESSION: 1. Chronic atelectasis and elongated nodule in the RIGHT middle lobe unchanged  from comparison CT or chest radiograph. 2. Chronic bronchitic markings. No clear acute cardiopulmonary findings Electronically Signed   By: Suzy Bouchard M.D.   On: 02/21/2017 17:26   Ct Head Wo Contrast  Result Date: 02/21/2017 CLINICAL DATA:  Unexplained altered mental status EXAM: CT HEAD WITHOUT CONTRAST TECHNIQUE: Contiguous axial images were obtained from the base of the skull through the vertex without intravenous contrast. COMPARISON:  03/19/2014 FINDINGS: BRAIN: There is mild chronic sulcal and ventricular prominence consistent with superficial and central atrophy. No intraparenchymal hemorrhage, mass effect nor midline shift. Periventricular and subcortical white matter hypodensities consistent with chronic small vessel ischemic disease are identified. No acute large vascular territory infarcts. No abnormal extra-axial fluid collections. Basal cisterns are not effaced and midline. VASCULAR: Mild-to-moderate calcific atherosclerosis of the carotid siphons. No hyperdense vessels. SKULL: No skull fracture. No significant scalp soft tissue swelling. SINUSES/ORBITS: The mastoid air-cells are clear. The included paranasal sinuses are  well-aerated.The included ocular globes and orbital contents are non-suspicious. OTHER: None. IMPRESSION: Atrophy with chronic small vessel ischemia. No acute intracranial abnormality. Electronically Signed   By: Ashley Royalty M.D.   On: 02/21/2017 17:20   Mm Digital Screening Bilateral  Result Date: 02/15/2017 CLINICAL DATA:  Screening. EXAM: DIGITAL SCREENING BILATERAL MAMMOGRAM WITH CAD COMPARISON:  Previous exam(s). ACR Breast Density Category b: There are scattered areas of fibroglandular density. FINDINGS: There are no findings suspicious for malignancy. Images were processed with CAD. IMPRESSION: No mammographic evidence of malignancy. A result letter of this screening mammogram will be mailed directly to the patient. RECOMMENDATION: Screening mammogram in one year. (Code:SM-B-01Y) BI-RADS CATEGORY  1: Negative. Electronically Signed   By: Lajean Manes M.D.   On: 02/15/2017 09:15    Microbiology: Recent Results (from the past 240 hour(s))  Urine culture     Status: Abnormal   Collection Time: 02/16/17  1:40 PM  Result Value Ref Range Status   Specimen Description URINE, RANDOM  Final   Special Requests NONE  Final   Culture MULTIPLE SPECIES PRESENT, SUGGEST RECOLLECTION (A)  Final   Report Status 02/18/2017 FINAL  Final     Labs: Basic Metabolic Panel:  Recent Labs Lab 02/16/17 1342 02/21/17 1344 02/21/17 1415 02/22/17 0432  NA 129* 124* 125* 137  K 4.2 5.5* 5.5* 3.7  CL 90* 87* 89* 102  CO2 23 22  --  27  GLUCOSE 567* 893* >700* 220*  BUN 41* 34* 41* 21*  CREATININE 1.87* 1.72* 1.60* 1.07*  CALCIUM 9.4 9.3  --  8.9   Liver Function Tests:  Recent Labs Lab 02/21/17 1344 02/22/17 0432  AST 27 20  ALT 22 19  ALKPHOS 123 96  BILITOT 0.9 0.6  PROT 7.1 6.7  ALBUMIN 3.9 3.4*   No results for input(s): LIPASE, AMYLASE in the last 168 hours. No results for input(s): AMMONIA in the last 168 hours. CBC:  Recent Labs Lab 02/16/17 1342 02/21/17 1344 02/21/17 1415  02/22/17 0432  WBC 7.1 3.6*  --  4.8  NEUTROABS  --  2.4  --   --   HGB 12.8 11.3* 13.9 11.7*  HCT 40.1 35.1* 41.0 35.2*  MCV 73.3* 72.1*  --  72.7*  PLT 216 51*  --  180   Cardiac Enzymes: No results for input(s): CKTOTAL, CKMB, CKMBINDEX, TROPONINI in the last 168 hours. BNP: BNP (last 3 results) No results for input(s): BNP in the last 8760 hours.  ProBNP (last 3 results) No results for input(s): PROBNP in the  last 8760 hours.  CBG:  Recent Labs Lab 02/21/17 1909 02/21/17 2002 02/21/17 2242 02/22/17 0008 02/22/17 0818  GLUCAP 454* 432* 402* 311* 330*

## 2017-02-22 NOTE — Progress Notes (Signed)
Inpatient Diabetes Program Recommendations  AACE/ADA: New Consensus Statement on Inpatient Glycemic Control (2015)  Target Ranges:  Prepandial:   less than 140 mg/dL      Peak postprandial:   less than 180 mg/dL (1-2 hours)      Critically ill patients:  140 - 180 mg/dL   Lab Results  Component Value Date   GLUCAP 330 (H) 02/22/2017   HGBA1C 9.2 (H) 09/28/2016    Review of Glycemic Control  Diabetes history: DM2 Outpatient Diabetes medications: 7030 25 units bid Current orders for Inpatient glycemic control: 70/30 30 units bid, Novolog 0-20 units tidwc and hs  HgbA1C pending.  Spoke with pt regarding her glycemic control at home. Pt states she checks blood sugars 3x/day and it's usually high. Does not skip insulin, but does admit to dietary indiscretion. States she drinks sweet tea and regular sodas, but leaves off desserts. Discussed how diet and exercise can impact her diabetes control. Sees [name] and states she has an appt tomorrow. Interested in OP Diabetes Education. Will order. Will ask RN to allow pt to give her own insulin while inpatient to check technique.  Will continue to follow. Agree with increase in 70/30 insulin to 30 units bid.  Thank you. Lorenda Peck, RD, LDN, CDE Inpatient Diabetes Coordinator [phone]

## 2017-02-22 NOTE — Discharge Instructions (Signed)

## 2017-02-23 ENCOUNTER — Encounter: Payer: Self-pay | Admitting: Internal Medicine

## 2017-02-23 ENCOUNTER — Ambulatory Visit (INDEPENDENT_AMBULATORY_CARE_PROVIDER_SITE_OTHER): Payer: Medicare Other | Admitting: Internal Medicine

## 2017-02-23 VITALS — BP 116/72 | HR 98 | Temp 98.8°F | Ht 63.0 in | Wt 184.0 lb

## 2017-02-23 DIAGNOSIS — I10 Essential (primary) hypertension: Secondary | ICD-10-CM

## 2017-02-23 DIAGNOSIS — M79674 Pain in right toe(s): Secondary | ICD-10-CM

## 2017-02-23 DIAGNOSIS — E119 Type 2 diabetes mellitus without complications: Secondary | ICD-10-CM

## 2017-02-23 LAB — POCT GLYCOSYLATED HEMOGLOBIN (HGB A1C): Hemoglobin A1C: 14.9

## 2017-02-23 MED ORDER — INSULIN GLARGINE 100 UNIT/ML SOLOSTAR PEN: 90.0000 [IU] | PEN_INJECTOR | Freq: Every day | SUBCUTANEOUS | 11 refills | Status: DC

## 2017-02-23 MED ORDER — INSULIN NPH ISOPHANE & REGULAR (70-30) 100 UNIT/ML ~~LOC~~ SUSP: SUBCUTANEOUS | 11 refills | Status: DC

## 2017-02-23 NOTE — Patient Instructions (Addendum)
OK to increase the 70/30 insulin to 50 units in the AM, and 40 units in the PM  You are also given a prescription for Lantus 90 units but NOT both at the same time; if this is high priced at the pharmacy, ask them to let us know of a similar insulin that might be less expensive under your insurance  Please continue to monitor your Blood Sugars at least 3 times per day (before breakfast, lunch and dinner), and even before bedtime would good sometimes  Please call in 1 WEEK if your sugars are still more than 200 for the most part, and let us know which insulin you take  Please continue all other medications as before, and refills have been done if requested.  Please have the pharmacy call with any other refills you may need.  Please continue your efforts at being more active, low cholesterol diabetic diet, and weight control.  Please keep your appointments with your specialists as you may have planned  Please return in 3 months, or sooner if needed, with Lab testing done 3-5 days before

## 2017-02-23 NOTE — Progress Notes (Signed)
Subjective:    Patient ID: Deborah Neal, female    DOB: Apr 05, 1951, 66 y.o.   MRN: [id number]  HPI  Here to f/u; overall doing ok,  Pt denies chest pain, increasing sob or doe, wheezing, orthopnea, PND, increased LE swelling, palpitations, dizziness or syncope.  Pt denies new neurological symptoms such as new headache, or facial or extremity weakness or numbness.  Pt denies polydipsia, polyuria, or low sugar episode.  Pt states overall good compliance with meds, mostly trying to follow appropriate diet, with wt overall stable,  but little exercise however. Was just hospd overnight with glc > 500. Not checking sugars well at home.  Takes most of her meds per pt.   Pt denies fever, wt loss, night sweats, loss of appetite, or other constitutional symptoms.  Also incidentally hit 4th toe right foot on the furniture with pain and swelling today Past Medical History:  Diagnosis Date  . ALLERGIC RHINITIS 01/29/2007  . Arthritis   . ASTHMA 01/29/2007  . ASTHMA, WITH ACUTE EXACERBATION 06/24/2008  . BACK PAIN 01/29/2009  . CHEST PAIN 06/24/2008  . Family history of adverse reaction to anesthesia    Patients daughter has N/V after anesthesia  . GERD (gastroesophageal reflux disease)   . History of shingles   . HYPERLIPIDEMIA 01/29/2007  . HYPERTENSION 01/29/2007  . OSTEOPENIA 10/16/2007  . Type II or unspecified type diabetes mellitus without mention of complication, uncontrolled 11/29/2013   Past Surgical History:  Procedure Laterality Date  . CHOLECYSTECTOMY N/A 09/19/2015   Procedure: LAPAROSCOPIC CHOLECYSTECTOMY WITH INTRAOPERATIVE CHOLANGIOGRAM;  Surgeon: Jackolyn Confer, MD;  Location: [facility];  Service: General;  Laterality: N/A;  . COLONOSCOPY    . TONSILLECTOMY  1956  . VIDEO BRONCHOSCOPY Bilateral 03/28/2014   Procedure: VIDEO BRONCHOSCOPY WITHOUT FLUORO;  Surgeon: Tanda Rockers, MD;  Location: [facility];  Service: Cardiopulmonary;  Laterality: Bilateral;    reports that she quit smoking about  13 years ago. She has never used smokeless tobacco. She reports that she does not drink alcohol or use drugs. family history includes Asthma in her daughter and father; Cancer in her mother; Diabetes in her father; Heart disease in her father; Hypertension in her father. No Known Allergies Current Outpatient Prescriptions on File Prior to Visit  Medication Sig Dispense Refill  . albuterol (PROVENTIL HFA;VENTOLIN HFA) 108 (90 Base) MCG/ACT inhaler Inhale 2 puffs into the lungs every 6 (six) hours as needed for wheezing or shortness of breath. 6.7 g 0  . alendronate (FOSAMAX) 70 MG tablet TAKE ONE TABLET BY MOUTH ONCE EVERY 7 DAYS. TAKE  WITH  A  FULL  GLASS  OF  WATER  AND  ON  AN  EMPTY  STOMACH 12 tablet 1  . amLODipine-benazepril (LOTREL) 5-20 MG capsule TAKE TWO CAPSULES BY MOUTH ONCE DAILY 180 capsule 2  . aspirin 81 MG tablet Take 81 mg by mouth every morning.     . Blood Glucose Monitoring Suppl (ONE TOUCH ULTRA 2) w/Device KIT Use as directed 1 each 0  . cyclobenzaprine (FLEXERIL) 5 MG tablet 1-2 qhs prn 14 tablet 0  . glucose blood (ONE TOUCH ULTRA TEST) test strip Use to check blood sugars three times a day 300 each 1  . hydrochlorothiazide (HYDRODIURIL) 25 MG tablet TAKE ONE TABLET BY MOUTH ONCE DAILY 90 tablet 2  . Lancets MISC Use as directed four times daily 400 each 11  . Multiple Vitamin (MULTIVITAMIN WITH MINERALS) TABS tablet Take 1 tablet by mouth daily.    Marland Kitchen  potassium chloride (K-DUR) 10 MEQ tablet Take 3 tablets (30 mEq total) by mouth daily. 270 tablet 3  . rosuvastatin (CRESTOR) 20 MG tablet TAKE ONE TABLET BY MOUTH ONCE DAILY 90 tablet 2   No current facility-administered medications on file prior to visit.    Review of Systems  Constitutional: Negative for other unusual diaphoresis or sweats HENT: Negative for ear discharge or swelling Eyes: Negative for other worsening visual disturbances Respiratory: Negative for stridor or other swelling  Gastrointestinal: Negative  for worsening distension or other blood Genitourinary: Negative for retention or other urinary change Musculoskeletal: Negative for other MSK pain or swelling Skin: Negative for color change or other new lesions Neurological: Negative for worsening tremors and other numbness  Psychiatric/Behavioral: Negative for worsening agitation or other fatigue All other system neg per pt    Objective:   Physical Exam BP 116/72   Pulse 98   Temp 98.8 F (37.1 C) (Oral)   Ht '5\' 3"'  (1.6 m)   Wt 184 lb (83.5 kg)   SpO2 96%   BMI 32.59 kg/m  VS noted,  Constitutional: Pt appears in NAD HENT: Head: NCAT.  Right Ear: External ear normal.  Left Ear: External ear normal.  Eyes: . Pupils are equal, round, and reactive to light. Conjunctivae and EOM are normal Nose: without d/c or deformity Neck: Neck supple. Gross normal ROM Cardiovascular: Normal rate and regular rhythm.   Pulmonary/Chest: Effort normal and breath sounds without rales or wheezing.  Abd:  Soft, NT, ND, + BS, no organomegaly Neurological: Pt is alert. At baseline orientation, motor grossly intact Skin: Skin is warm. No rashes, other new lesions, no LE edema, right 4th toe with trace to 1+ tender, swelling without redness or ulcer Psychiatric: Pt behavior is normal without agitation  No other exam findings  POCT glycosylated hemoglobin (Hb A1C)  Order: [id number]  Status:  Final result Visible to patient:  No (Not Released) Dx:  Diabetes mellitus type II, non insuli...  Component 11:18  Hemoglobin A1C 14.9             Assessment & Plan:

## 2017-02-24 LAB — HEMOGLOBIN A1C

## 2017-02-25 DIAGNOSIS — M79674 Pain in right toe(s): Secondary | ICD-10-CM | POA: Insufficient documentation

## 2017-02-25 NOTE — Assessment & Plan Note (Signed)
BP Readings from Last 3 Encounters:  02/23/17 116/72  02/22/17 (!) 122/59  02/16/17 101/83  stable overall by history and exam, recent data reviewed with pt, and pt to continue medical treatment as before,  to f/u any worsening symptoms or concerns

## 2017-02-25 NOTE — Assessment & Plan Note (Signed)
Severe uncontrolled diet, for increased 70/30 to 50 in AM, 40 in PM, as well as check cbg and call for persistent BS > 200 by 3-5 days

## 2017-02-25 NOTE — Assessment & Plan Note (Signed)
With contusion, doubt fx, declines film, ok for buddy tape

## 2017-03-28 ENCOUNTER — Telehealth: Payer: Self-pay | Admitting: Internal Medicine

## 2017-03-28 DIAGNOSIS — J069 Acute upper respiratory infection, unspecified: Secondary | ICD-10-CM

## 2017-03-28 DIAGNOSIS — B9789 Other viral agents as the cause of diseases classified elsewhere: Principal | ICD-10-CM

## 2017-03-28 NOTE — Telephone Encounter (Signed)
Copied from [place] [id number]. Topic: Quick Communication - See Telephone Encounter >> Mar 28, 2017  4:32 PM Bea Graff, NT wrote: CRM for notification. See Telephone encounter for: Patient states she contacted [facility] on [address] and in order for her to have her medicines refilled there that she needed to contact the office to get a refill on all of her medications.   03/28/17.

## 2017-03-29 MED ORDER — HYDROCHLOROTHIAZIDE 25 MG PO TABS: 25.0000 mg | ORAL_TABLET | Freq: Every day | ORAL | 3 refills | Status: DC

## 2017-03-29 MED ORDER — POTASSIUM CHLORIDE ER 10 MEQ PO TBCR: 30.0000 meq | EXTENDED_RELEASE_TABLET | Freq: Every day | ORAL | 3 refills | Status: DC

## 2017-03-29 MED ORDER — LANCETS MISC: 11 refills | Status: DC

## 2017-03-29 MED ORDER — ROSUVASTATIN CALCIUM 20 MG PO TABS: 20.0000 mg | ORAL_TABLET | Freq: Every day | ORAL | 3 refills | Status: DC

## 2017-03-29 MED ORDER — INSULIN GLARGINE 100 UNIT/ML SOLOSTAR PEN: 90.0000 [IU] | PEN_INJECTOR | Freq: Every day | SUBCUTANEOUS | 11 refills | Status: DC

## 2017-03-29 MED ORDER — ALENDRONATE SODIUM 70 MG PO TABS: ORAL_TABLET | ORAL | 3 refills | Status: DC

## 2017-03-29 MED ORDER — AMLODIPINE BESY-BENAZEPRIL HCL 5-20 MG PO CAPS: ORAL_CAPSULE | ORAL | 3 refills | Status: DC

## 2017-03-29 MED ORDER — INSULIN NPH ISOPHANE & REGULAR (70-30) 100 UNIT/ML ~~LOC~~ SUSP: SUBCUTANEOUS | 11 refills | Status: DC

## 2017-03-29 MED ORDER — GLUCOSE BLOOD VI STRP: ORAL_STRIP | 11 refills | Status: DC

## 2017-03-29 NOTE — Telephone Encounter (Signed)
Patient wants to transfer all Rx- review for refill medications and supplies

## 2017-03-29 NOTE — Addendum Note (Signed)
Addended by: Biagio Borg on: 03/29/2017 09:26 PM   Modules accepted: Orders

## 2017-03-29 NOTE — Telephone Encounter (Signed)
I believe I sent all her needed chronic meds to [facility] on [address]  There were prescriptions for both 70/30 insulin AND Lantus solostart - she likely only needs one of these  Please contact pt to determine which she takes, then call the pharmacy to cancer the other, thanks

## 2017-03-29 NOTE — Telephone Encounter (Signed)
Please advise 

## 2017-03-30 NOTE — Addendum Note (Signed)
Addended by: Juliet Rude on: 03/30/2017 08:41 AM   Modules accepted: Orders

## 2017-03-30 NOTE — Telephone Encounter (Signed)
Spoke with patient, she is using the insulin pen. I have deleted the other entry. She knoes scripts are at [facility] on [address].

## 2017-03-31 ENCOUNTER — Telehealth: Payer: Self-pay | Admitting: Internal Medicine

## 2017-03-31 NOTE — Telephone Encounter (Signed)
I will see what the PA final determination will be then we can go from there. If denied we can discuss the below options with the patient.

## 2017-03-31 NOTE — Telephone Encounter (Signed)
Final PA determination was a denial. Would you like to proceed with the 10/40mg ?

## 2017-03-31 NOTE — Telephone Encounter (Signed)
Copied from [place] [id number]. Topic: Quick Communication - See Telephone Encounter >> Mar 31, 2017  8:28 AM Ether Griffins B wrote: CRM for notification. See Telephone encounter for:  Reguarding the pre auth for medication amlodipine benazepril. They are wanting to know why pt cant take 10-40mg . They are faxing over a paper to the office as well call back number is [phone] 03/31/17.

## 2017-03-31 NOTE — Telephone Encounter (Signed)
[name] I submitted a PA request that needed to be done for the amlodipine benazepril 5/20mg  capsules. Insurance company would like to know why the patient can not take the 10/40mg . I looked in her chart and it doesn't look like she has ever taken anything but the 5/20mg . Is she able to take the 10/40mg  or would you like for them to proceed with the PA for the 5/20mg  that she is currently on? Please advise.

## 2017-03-31 NOTE — Telephone Encounter (Signed)
The 10/40 would be ok, if pt is willing to take HALF the pill per day (she would need to cut it); or we could just make the prescription in to the 2 separate medications (which usually does not have to have PA, but does mean 2 copays instead of 1), thanks

## 2017-04-01 MED ORDER — AMLODIPINE BESYLATE 5 MG PO TABS: 5.0000 mg | ORAL_TABLET | Freq: Every day | ORAL | 3 refills | Status: DC

## 2017-04-01 MED ORDER — BENAZEPRIL HCL 20 MG PO TABS: 20.0000 mg | ORAL_TABLET | Freq: Every day | ORAL | 3 refills | Status: DC

## 2017-04-01 NOTE — Addendum Note (Signed)
Addended by: Biagio Borg on: 04/01/2017 08:31 AM   Modules accepted: Orders

## 2017-04-01 NOTE — Telephone Encounter (Signed)
Ok to stop the lotrel 5/20  Ok to start amlodipine 5 qd, and benazepril 20 separate rx - done erx

## 2017-04-01 NOTE — Telephone Encounter (Signed)
Called pt, Left detailed msg with details below.

## 2017-04-06 ENCOUNTER — Ambulatory Visit: Payer: BC Managed Care – PPO | Admitting: Internal Medicine

## 2017-04-06 IMAGING — RF DG CHOLANGIOGRAM OPERATIVE
1 series · 4 of 4 positions shown · non-contrast
Comparison: CT 08/04/2015

CLINICAL DATA: Lap Chole With IOC for symptomatic cholelithiasis.
RSTO performed by Matte[facility] [place]. Fluoro Time is 28 secs.

EXAM:
INTRAOPERATIVE CHOLANGIOGRAM
TECHNIQUE: Cholangiographic images from the C-arm fluoroscopic device were
submitted for interpretation post-operatively. Please see the
procedural report for the amount of contrast and the fluoroscopy
time utilized.

[Series 1: run · 4 of 195 frames shown]
[frame 30/195]
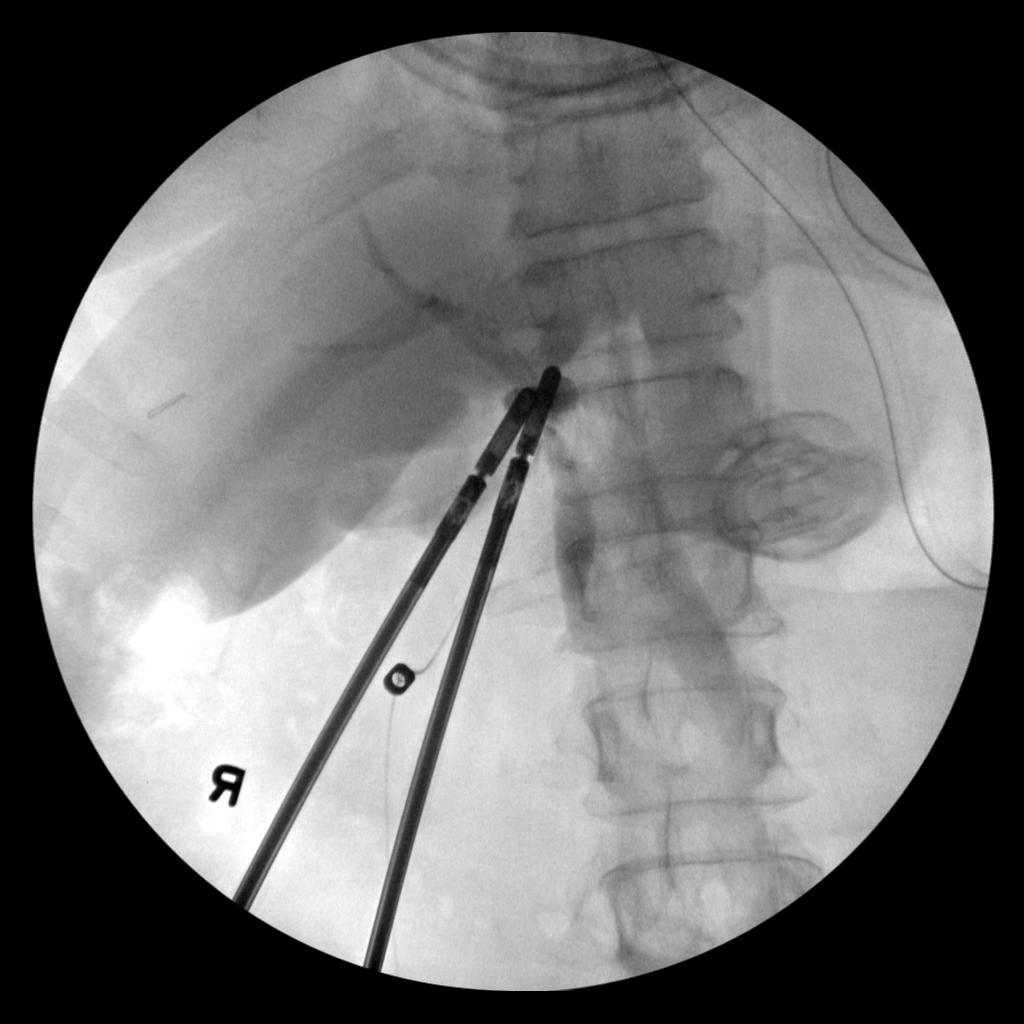
[frame 98/195]
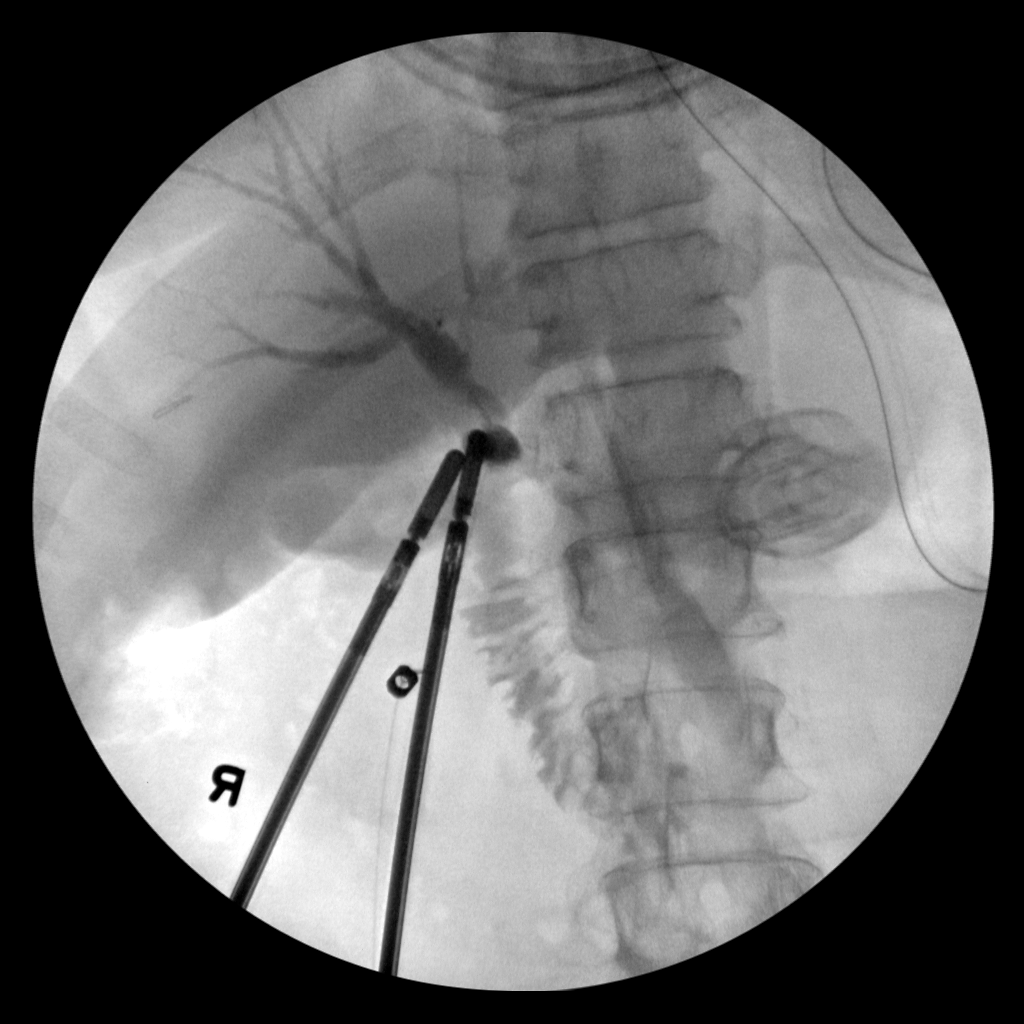
[frame 166/195]
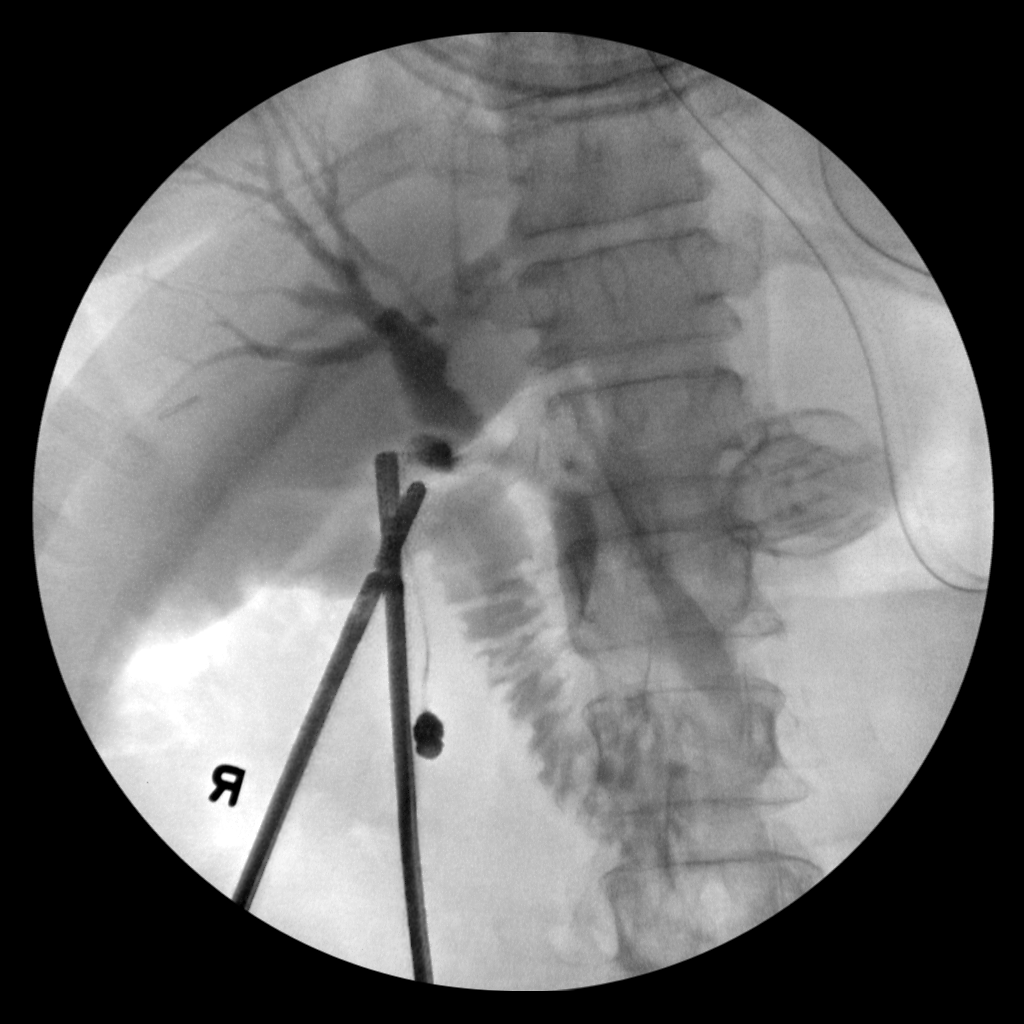
[frame 167/195]
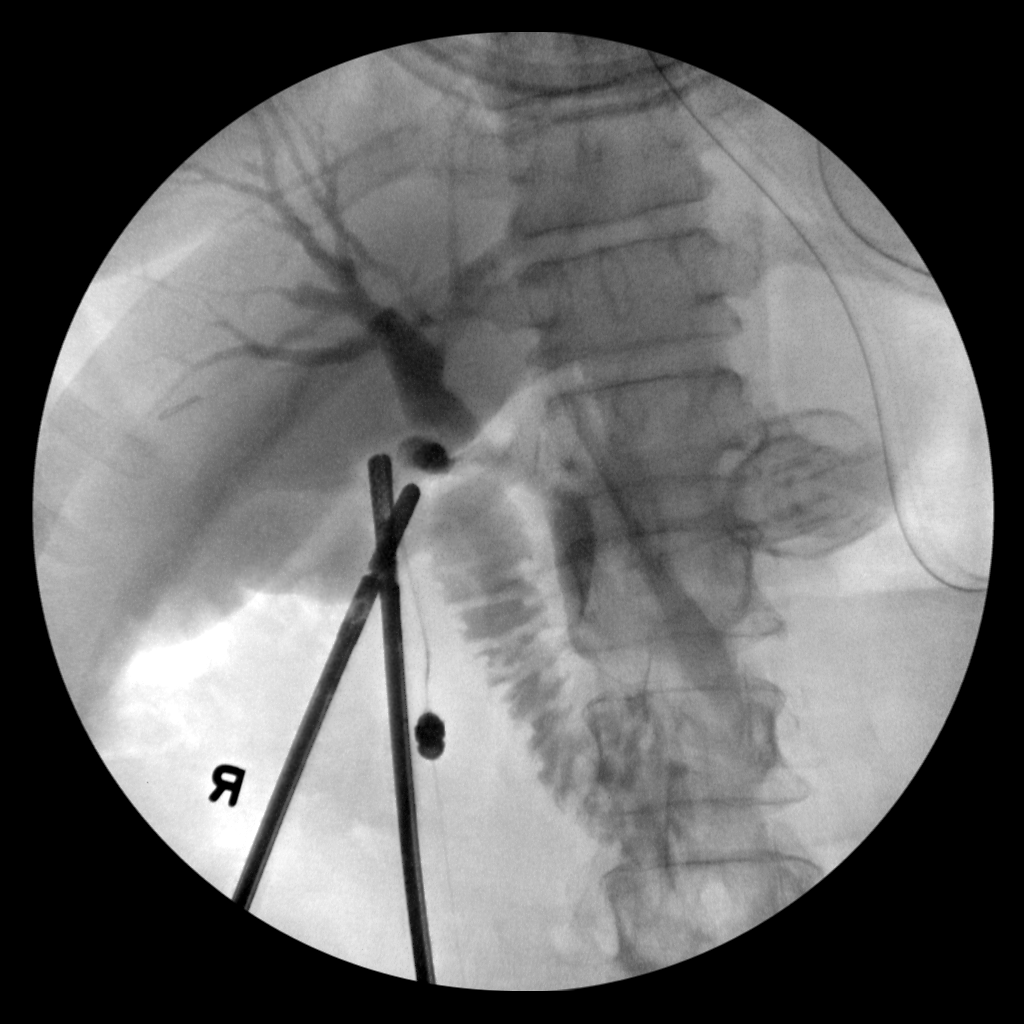

[4 of 4 positions shown; findings below may reference images not displayed]

FINDINGS: No persistent filling defects in the common duct. Intrahepatic ducts
are incompletely visualized, appearing decompressed centrally.
Contrast passes into the duodenum.

:
Negative for retained common duct stone.

## 2017-06-15 ENCOUNTER — Other Ambulatory Visit (INDEPENDENT_AMBULATORY_CARE_PROVIDER_SITE_OTHER): Payer: Medicare Other

## 2017-06-15 ENCOUNTER — Ambulatory Visit (INDEPENDENT_AMBULATORY_CARE_PROVIDER_SITE_OTHER): Payer: Medicare Other | Admitting: Internal Medicine

## 2017-06-15 ENCOUNTER — Other Ambulatory Visit: Payer: Self-pay | Admitting: Internal Medicine

## 2017-06-15 ENCOUNTER — Encounter: Payer: Self-pay | Admitting: Internal Medicine

## 2017-06-15 ENCOUNTER — Telehealth: Payer: Self-pay

## 2017-06-15 VITALS — BP 116/72 | HR 94 | Temp 98.5°F | Ht 63.0 in | Wt 187.0 lb

## 2017-06-15 DIAGNOSIS — E119 Type 2 diabetes mellitus without complications: Secondary | ICD-10-CM | POA: Diagnosis not present

## 2017-06-15 DIAGNOSIS — R29898 Other symptoms and signs involving the musculoskeletal system: Secondary | ICD-10-CM | POA: Diagnosis not present

## 2017-06-15 DIAGNOSIS — B9789 Other viral agents as the cause of diseases classified elsewhere: Secondary | ICD-10-CM | POA: Diagnosis not present

## 2017-06-15 DIAGNOSIS — I10 Essential (primary) hypertension: Secondary | ICD-10-CM | POA: Diagnosis not present

## 2017-06-15 DIAGNOSIS — Z794 Long term (current) use of insulin: Principal | ICD-10-CM

## 2017-06-15 DIAGNOSIS — J069 Acute upper respiratory infection, unspecified: Secondary | ICD-10-CM

## 2017-06-15 LAB — BASIC METABOLIC PANEL
BUN: 16 mg/dL (ref 6–23)
CALCIUM: 9.8 mg/dL (ref 8.4–10.5)
CO2: 32 mEq/L (ref 19–32)
Chloride: 97 mEq/L (ref 96–112)
Creatinine, Ser: 1.2 mg/dL (ref 0.40–1.20)
GFR: 57.81 mL/min — AB (ref >60.00)
Glucose, Bld: 519 mg/dL (ref 70–99)
POTASSIUM: 4.2 meq/L (ref 3.5–5.1)
SODIUM: 135 meq/L (ref 135–145)

## 2017-06-15 LAB — LIPID PANEL
CHOLESTEROL: 114 mg/dL (ref 0–200)
HDL: 37.4 mg/dL — AB (ref >39.00)
LDL Cholesterol: 45 mg/dL (ref 0–99)
NONHDL: 76.62
Total CHOL/HDL Ratio: 3
Triglycerides: 160 mg/dL — ABNORMAL HIGH (ref 0.0–149.0)
VLDL: 32 mg/dL (ref 0.0–40.0)

## 2017-06-15 LAB — HEPATIC FUNCTION PANEL
ALBUMIN: 4.1 g/dL (ref 3.5–5.2)
ALT: 28 U/L (ref 0–35)
AST: 24 U/L (ref 0–37)
Alkaline Phosphatase: 90 U/L (ref 39–117)
Bilirubin, Direct: 0.1 mg/dL (ref 0.0–0.3)
TOTAL PROTEIN: 7.3 g/dL (ref 6.0–8.3)
Total Bilirubin: 0.3 mg/dL (ref 0.2–1.2)

## 2017-06-15 LAB — HEMOGLOBIN A1C: Hgb A1c MFr Bld: 15.1 % — ABNORMAL HIGH (ref 4.6–6.5)

## 2017-06-15 MED ORDER — INSULIN GLARGINE 100 UNIT/ML SOLOSTAR PEN: 90.0000 [IU] | PEN_INJECTOR | Freq: Every day | SUBCUTANEOUS | 11 refills | Status: DC

## 2017-06-15 MED ORDER — ALBUTEROL SULFATE HFA 108 (90 BASE) MCG/ACT IN AERS: 2.0000 | INHALATION_SPRAY | Freq: Four times a day (QID) | RESPIRATORY_TRACT | 0 refills | Status: DC | PRN

## 2017-06-15 NOTE — Telephone Encounter (Signed)
-----   Message from Biagio Borg, MD sent at 06/15/2017  4:01 PM EST ----- Regarding: high sugar Ok to ask pt to take 10 units Lantus EXTRA now on top of her usual dose  She is on high dose insulin but still has quite severe elevated sugars - we will need to refer to Endocrinology/thanks  ----- Message ----- From: Juliet Rude, CMA Sent: 06/15/2017   2:49 PM To: Biagio Borg, MD  Critical Lab:  Glucose 519

## 2017-06-15 NOTE — Progress Notes (Signed)
Subjective:    Patient ID: Deborah Neal, female    DOB: 01/15/1951, 67 y.o.   MRN: [id number]  HPI  Here with sudden notice of RLE weakness, overall mild to mod, 2 wks, intermittent, fear of falling with giveaway but no falls, and denies Back pain, HA or any other pain.  Pt denies chest pain, increased sob or doe, wheezing, orthopnea, PND, increased LE swelling, palpitations, dizziness or syncope.  Pt denies other new neurological symptoms such as new headache, or facial or other extremity weakness or numbness.   Pt denies polydipsia, polyuria, or low sugar symptoms such as weakness or confusion improved with po intake.  Pt states overall good compliance with meds, trying to follow lower cholesterol, diabetic diet, wt overall stable but little exercise however.  CBGs have been lower in 100's per pt with good recent compliance with meds and diet.   Past Medical History:  Diagnosis Date  . ALLERGIC RHINITIS 01/29/2007  . Arthritis   . ASTHMA 01/29/2007  . ASTHMA, WITH ACUTE EXACERBATION 06/24/2008  . BACK PAIN 01/29/2009  . CHEST PAIN 06/24/2008  . Family history of adverse reaction to anesthesia    Patients daughter has N/V after anesthesia  . GERD (gastroesophageal reflux disease)   . History of shingles   . HYPERLIPIDEMIA 01/29/2007  . HYPERTENSION 01/29/2007  . OSTEOPENIA 10/16/2007  . Type II or unspecified type diabetes mellitus without mention of complication, uncontrolled 11/29/2013   Past Surgical History:  Procedure Laterality Date  . CHOLECYSTECTOMY N/A 09/19/2015   Procedure: LAPAROSCOPIC CHOLECYSTECTOMY WITH INTRAOPERATIVE CHOLANGIOGRAM;  Surgeon: Jackolyn Confer, MD;  Location: [place];  Service: General;  Laterality: N/A;  . COLONOSCOPY    . TONSILLECTOMY  1956  . VIDEO BRONCHOSCOPY Bilateral 03/28/2014   Procedure: VIDEO BRONCHOSCOPY WITHOUT FLUORO;  Surgeon: Tanda Rockers, MD;  Location: [facility];  Service: Cardiopulmonary;  Laterality: Bilateral;    reports that she quit smoking  about 13 years ago. she has never used smokeless tobacco. She reports that she does not drink alcohol or use drugs. family history includes Asthma in her daughter and father; Cancer in her mother; Diabetes in her father; Heart disease in her father; Hypertension in her father. No Known Allergies Current Outpatient Medications on File Prior to Visit  Medication Sig Dispense Refill  . alendronate (FOSAMAX) 70 MG tablet TAKE ONE TABLET BY MOUTH ONCE EVERY 7 DAYS. TAKE  WITH  A  FULL  GLASS  OF  WATER  AND  ON  AN  EMPTY  STOMACH 12 tablet 3  . amLODipine (NORVASC) 5 MG tablet Take 1 tablet (5 mg total) by mouth daily. 90 tablet 3  . aspirin 81 MG tablet Take 81 mg by mouth every morning.     . benazepril (LOTENSIN) 20 MG tablet Take 1 tablet (20 mg total) by mouth daily. 90 tablet 3  . Blood Glucose Monitoring Suppl (ONE TOUCH ULTRA 2) w/Device KIT Use as directed 1 each 0  . cyclobenzaprine (FLEXERIL) 5 MG tablet 1-2 qhs prn 14 tablet 0  . glucose blood (ONE TOUCH ULTRA TEST) test strip Use to check blood sugars three times a day 300 each 11  . hydrochlorothiazide (HYDRODIURIL) 25 MG tablet Take 1 tablet (25 mg total) by mouth daily. 90 tablet 3  . Lancets MISC Use as directed four times daily  E11.9 400 each 11  . Multiple Vitamin (MULTIVITAMIN WITH MINERALS) TABS tablet Take 1 tablet by mouth daily.    . potassium chloride (  K-DUR) 10 MEQ tablet Take 3 tablets (30 mEq total) by mouth daily. 270 tablet 3  . rosuvastatin (CRESTOR) 20 MG tablet Take 1 tablet (20 mg total) by mouth daily. 90 tablet 3   No current facility-administered medications on file prior to visit.    Review of Systems  Constitutional: Negative for other unusual diaphoresis or sweats HENT: Negative for ear discharge or swelling Eyes: Negative for other worsening visual disturbances Respiratory: Negative for stridor or other swelling  Gastrointestinal: Negative for worsening distension or other blood Genitourinary: Negative  for retention or other urinary change Musculoskeletal: Negative for other MSK pain or swelling Skin: Negative for color change or other new lesions Neurological: Negative for worsening tremors and other numbness  Psychiatric/Behavioral: Negative for worsening agitation or other fatigue All other system neg per pt    Objective:   Physical Exam BP 116/72   Pulse 94   Temp 98.5 F (36.9 C) (Oral)   Ht '5\' 3"'  (1.6 m)   Wt 187 lb (84.8 kg)   SpO2 99%   BMI 33.13 kg/m  VS noted,  Constitutional: Pt appears in NAD HENT: Head: NCAT.  Right Ear: External ear normal.  Left Ear: External ear normal.  Eyes: . Pupils are equal, round, and reactive to light. Conjunctivae and EOM are normal Nose: without d/c or deformity Neck: Neck supple. Gross normal ROM Cardiovascular: Normal rate and regular rhythm.   Pulmonary/Chest: Effort normal and breath sounds without rales or wheezing.  Abd:  Soft, NT, ND, + BS, no organomegaly Spine nontender Neurological: Pt is alert. At baseline orientation, motor grossly intact except trace RLE distal weakness Skin: Skin is warm. No rashes, other new lesions, no LE edema Psychiatric: Pt behavior is normal without agitation  No other exam findings    Assessment & Plan:

## 2017-06-15 NOTE — Telephone Encounter (Signed)
Pt has been informed and expressed understanding.  

## 2017-06-15 NOTE — Patient Instructions (Addendum)
You will be contacted regarding the referral for: MRI for the head  Please continue all other medications as before, and refills have been done if requested.  Please have the pharmacy call with any other refills you may need.  Please continue your efforts at being more active, low cholesterol diet, and weight control.  You are otherwise up to date with prevention measures today.  Please keep your appointments with your specialists as you may have planned  Please go to the LAB in the Basement (turn left off the elevator) for the tests to be done today  You will be contacted by phone if any changes need to be made immediately.  Otherwise, you will receive a letter about your results with an explanation, but please check with MyChart first.  Please remember to sign up for MyChart if you have not done so, as this will be important to you in the future with finding out test results, communicating by private email, and scheduling acute appointments online when needed.  Please return in 6 months, or sooner if needed, with Lab testing done 3-5 days before

## 2017-06-18 ENCOUNTER — Encounter: Payer: Self-pay | Admitting: Internal Medicine

## 2017-06-18 NOTE — Assessment & Plan Note (Signed)
BP Readings from Last 3 Encounters:  06/15/17 116/72  02/23/17 116/72  02/22/17 (!) 122/59  stable overall by history and exam, recent data reviewed with pt, and pt to continue medical treatment as before,  to f/u any worsening symptoms or concerns

## 2017-06-18 NOTE — Assessment & Plan Note (Addendum)
Improved per pt o/w stable overall by history and exam, recent data reviewed with pt, and pt to continue medical treatment as before,  to f/u any worsening symptoms or concerns, for f/u A1c with labs

## 2017-06-18 NOTE — Assessment & Plan Note (Addendum)
Etiology unclear, differential includes subclinical stroke, radiculitis or even DM mononeuritis, for Head MRI r/o cva, further evaluation pending clinical course

## 2017-07-01 ENCOUNTER — Encounter: Payer: Self-pay | Admitting: Internal Medicine

## 2017-07-13 ENCOUNTER — Ambulatory Visit
Admission: RE | Admit: 2017-07-13 | Discharge: 2017-07-13 | Disposition: A | Discharge status: Home / Self Care | Payer: Medicare Other | Source: Ambulatory Visit | Attending: Internal Medicine | Admitting: Internal Medicine

## 2017-07-13 ENCOUNTER — Encounter: Payer: Self-pay | Admitting: Internal Medicine

## 2017-07-13 DIAGNOSIS — R29898 Other symptoms and signs involving the musculoskeletal system: Secondary | ICD-10-CM

## 2017-07-27 ENCOUNTER — Encounter: Payer: Self-pay | Admitting: Endocrinology

## 2017-07-27 ENCOUNTER — Ambulatory Visit: Payer: Medicare Other | Admitting: Endocrinology

## 2017-07-27 VITALS — BP 136/80 | HR 100 | Ht 63.0 in | Wt 188.0 lb

## 2017-07-27 DIAGNOSIS — Z6833 Body mass index (BMI) 33.0-33.9, adult: Secondary | ICD-10-CM

## 2017-07-27 DIAGNOSIS — E1165 Type 2 diabetes mellitus with hyperglycemia: Secondary | ICD-10-CM

## 2017-07-27 DIAGNOSIS — Z794 Long term (current) use of insulin: Secondary | ICD-10-CM

## 2017-07-27 DIAGNOSIS — E782 Mixed hyperlipidemia: Secondary | ICD-10-CM | POA: Diagnosis not present

## 2017-07-27 DIAGNOSIS — E6609 Other obesity due to excess calories: Secondary | ICD-10-CM | POA: Diagnosis not present

## 2017-07-27 MED ORDER — CANAGLIFLOZIN-METFORMIN HCL 50-1000 MG PO TABS: 2.0000 | ORAL_TABLET | Freq: Every day | ORAL | 2 refills | Status: DC

## 2017-07-27 MED ORDER — INSULIN REGULAR HUMAN (CONC) 500 UNIT/ML ~~LOC~~ SOPN
PEN_INJECTOR | SUBCUTANEOUS | 1 refills | Status: DC
Start: 2017-07-27 — End: 2017-09-08

## 2017-07-27 NOTE — Patient Instructions (Addendum)
Check blood sugars on waking up  4/7 days  Also check blood sugars about 2 hours after a meal and do this after different meals by rotation  Recommended blood sugar levels on waking up is 90-130 and about 2 hours after meal is 130-160  Please bring your blood sugar monitor to each visit, thank you  Cut out sodas and greasy food, cakes  NEW insulin dosing  LANTUS insulin: Stop taking the shot in the morning Continue 50 units in the evening  HUMULIN R U-500 insulin: This needs to be taken about 30 minutes before each meal as follows  Take 60 units before breakfast, 50 units before lunch and 40 units before dinner  INVOKAMET: This is a combination of metformin and Invokana Start taking 1 tablet with breakfast for the first week and then take 2 tablets daily with breakfast, let us know if you have any issues with yeast infections, may only need to use OTC Monistat for this  This will also lower your blood pressure and will need to stop the CHLORTHALIDONE at the same time

## 2017-07-27 NOTE — Progress Notes (Signed)
Patient ID: Deborah Neal, female   DOB: October 20, 1950, 67 y.o.   MRN: [id number]          Reason for Appointment: Consultation for Type 2 Diabetes  Referring physician: Cathlean Neal   History of Present Illness:          Date of diagnosis of type 2 diabetes mellitus:        Background history: She was initially started on metformin but apparently since severely since blood sugar not controlled she was switched to insulin in 2018 She has taken various types of insulin in the past including premixed insulin with usually poor control Does not have a history of taking a GLP-1 drug or medication like Invokana Her blood sugars were under fair control until 6/18 when her  A1c went up to 9.2 and subsequently has been higher  Recent history:   INSULIN regimen is:   Lantus 50 units twice daily     Non-insulin hypoglycemic drugs the patient is taking are: None  Her recent A1c was 15.1% in 2/19 and about the same in October 2018  Current management, blood sugar patterns and problems identified:   She did not bring her monitor for download, checking mostly at breakfast and either lunch or in the afternoon  She thinks her blood sugars are usually over 400 but later in the day they may go up to well over 500 also  She has not had any diabetes education and does not follow any midline  She tends to stay thirsty and will at least have one regular soft drink daily  She is also eating usually fast food at lunch about 5 days a week and does not look at fat content of her meals  May also have desserts like pound cake  Not motivated to do any walking for exercise        Side effects from medications have been: None  Compliance with the medical regimen: Fair   Glucose monitoring:  done 1 times a day         Glucometer: One Touch Ultra.       Blood Glucose readings by recall    PREMEAL Breakfast Lunch Dinner Bedtime  Overall   Glucose range: 200-411      Median:        POST-MEAL PC  Breakfast PC Lunch PC Dinner  Glucose range:  500   Median:      Self-care: The diet that the patient has been following is: None    Typical meal intake: Breakfast is oatmeal or toast and boiled egg, lunch would be fast food sandwich or bologna sandwich Usually has meat and potatoes at dinner most nights will be popcorn, cake or fruit                Dietician visit, most recent: none         Exercise:none    Weight history:  Wt Readings from Last 3 Encounters:  07/27/17 188 lb (85.3 kg)  06/15/17 187 lb (84.8 kg)  02/23/17 184 lb (83.5 kg)    Glycemic control:   Lab Results  Component Value Date   HGBA1C 15.1 (H) 06/15/2017   HGBA1C 14.9 02/23/2017   HGBA1C CLOT9 02/21/2017   Lab Results  Component Value Date   MICROALBUR 1.2 09/28/2016   LDLCALC 45 06/15/2017   CREATININE 1.20 06/15/2017   Lab Results  Component Value Date   MICRALBCREAT 0.7 09/28/2016    No results found for: FRUCTOSAMINE  Allergies as of 07/27/2017   No Known Allergies     Medication List        Accurate as of 07/27/17  5:00 PM. Always use your most recent med list.          albuterol 108 (90 Base) MCG/ACT inhaler Commonly known as:  PROVENTIL HFA;VENTOLIN HFA Inhale 2 puffs into the lungs every 6 (six) hours as needed for wheezing or shortness of breath.   alendronate 70 MG tablet Commonly known as:  FOSAMAX TAKE ONE TABLET BY MOUTH ONCE EVERY 7 DAYS. TAKE  WITH  A  FULL  GLASS  OF  WATER  AND  ON  AN  EMPTY  STOMACH   amLODipine 5 MG tablet Commonly known as:  NORVASC Take 1 tablet (5 mg total) by mouth daily.   aspirin 81 MG tablet Take 81 mg by mouth every morning.   benazepril 20 MG tablet Commonly known as:  LOTENSIN Take 1 tablet (20 mg total) by mouth daily.   Canagliflozin-metFORMIN HCl 50-1000 MG Tabs Commonly known as:  INVOKAMET Take 2 tablets by mouth daily with breakfast.   cyclobenzaprine 5 MG tablet Commonly known as:  FLEXERIL 1-2 qhs prn   glucose  blood test strip Commonly known as:  ONE TOUCH ULTRA TEST Use to check blood sugars three times a day   hydrochlorothiazide 25 MG tablet Commonly known as:  HYDRODIURIL Take 1 tablet (25 mg total) by mouth daily.   Insulin Glargine 100 UNIT/ML Solostar Pen Commonly known as:  LANTUS SOLOSTAR Inject 90 Units into the skin daily. E11.9   insulin regular human CONCENTRATED 500 UNIT/ML kwikpen Commonly known as:  HUMULIN R U-500 KWIKPEN 60 units, 30 minutes before each meal   Lancets Misc Use as directed four times daily  E11.9   multivitamin with minerals Tabs tablet Take 1 tablet by mouth daily.   ONE TOUCH ULTRA 2 w/Device Kit Use as directed   potassium chloride 10 MEQ tablet Commonly known as:  K-DUR Take 3 tablets (30 mEq total) by mouth daily.   rosuvastatin 20 MG tablet Commonly known as:  CRESTOR Take 1 tablet (20 mg total) by mouth daily.       Allergies: No Known Allergies  Past Medical History:  Diagnosis Date  . ALLERGIC RHINITIS 01/29/2007  . Arthritis   . ASTHMA 01/29/2007  . ASTHMA, WITH ACUTE EXACERBATION 06/24/2008  . BACK PAIN 01/29/2009  . CHEST PAIN 06/24/2008  . Family history of adverse reaction to anesthesia    Patients daughter has N/V after anesthesia  . GERD (gastroesophageal reflux disease)   . History of shingles   . HYPERLIPIDEMIA 01/29/2007  . HYPERTENSION 01/29/2007  . OSTEOPENIA 10/16/2007  . Type II or unspecified type diabetes mellitus without mention of complication, uncontrolled 11/29/2013    Past Surgical History:  Procedure Laterality Date  . CHOLECYSTECTOMY N/A 09/19/2015   Procedure: LAPAROSCOPIC CHOLECYSTECTOMY WITH INTRAOPERATIVE CHOLANGIOGRAM;  Surgeon: Jackolyn Confer, MD;  Location: [place];  Service: General;  Laterality: N/A;  . COLONOSCOPY    . TONSILLECTOMY  1956  . VIDEO BRONCHOSCOPY Bilateral 03/28/2014   Procedure: VIDEO BRONCHOSCOPY WITHOUT FLUORO;  Surgeon: Tanda Rockers, MD;  Location: [facility];  Service:  Cardiopulmonary;  Laterality: Bilateral;    Family History  Problem Relation Age of Onset  . Cancer Mother        ? type   . Diabetes Father   . Hypertension Father   . Heart disease Father   . Asthma  Father   . Asthma Daughter   . Colon cancer Neg Hx   . Esophageal cancer Neg Hx   . Rectal cancer Neg Hx   . Stomach cancer Neg Hx   . Breast cancer Neg Hx     Social History:  reports that she quit smoking about 13 years ago. She has never used smokeless tobacco. She reports that she does not drink alcohol or use drugs.   Review of Systems  Constitutional: Negative for weight loss and reduced appetite.  HENT: Negative for headaches.   Eyes: Negative for blurred vision.  Respiratory: Negative for shortness of breath.   Cardiovascular: Negative for leg swelling.  Gastrointestinal: Negative for diarrhea.  Endocrine: Positive for polydipsia.  Genitourinary: Positive for frequency and nocturia.  Musculoskeletal: Positive for joint pain.  Skin: Negative for rash.  Neurological: Positive for weakness, numbness and tingling.       Right leg tends to give way and also has a little numbness.  No significant numbness on the left side.  Has discussed with PCP     Lipid history: On Crestor for management prescribed by  PCP    Lab Results  Component Value Date   CHOL 114 06/15/2017   HDL 37.40 (L) 06/15/2017   LDLCALC 45 06/15/2017   LDLDIRECT 153.4 01/29/2009   TRIG 160.0 (H) 06/15/2017   CHOLHDL 3 06/15/2017           Hypertension: Reasonably well controlled  BP Readings from Last 3 Encounters:  07/27/17 136/80  06/15/17 116/72  02/23/17 116/72    Most recent eye exam was in 12/18  Most recent foot exam: 07/2017    LABS:  No visits with results within 1 Week(s) from this visit.  Latest known visit with results is:  Appointment on 06/15/2017  Component Date Value Ref Range Status  . Total Bilirubin 06/15/2017 0.3  0.2 - 1.2 mg/dL Final  . Bilirubin, Direct  06/15/2017 0.1  0.0 - 0.3 mg/dL Final  . Alkaline Phosphatase 06/15/2017 90  39 - 117 U/L Final  . AST 06/15/2017 24  0 - 37 U/L Final  . ALT 06/15/2017 28  0 - 35 U/L Final  . Total Protein 06/15/2017 7.3  6.0 - 8.3 g/dL Final  . Albumin 06/15/2017 4.1  3.5 - 5.2 g/dL Final  . Sodium 06/15/2017 135  135 - 145 mEq/L Final  . Potassium 06/15/2017 4.2  3.5 - 5.1 mEq/L Final  . Chloride 06/15/2017 97  96 - 112 mEq/L Final  . CO2 06/15/2017 32  19 - 32 mEq/L Final  . Glucose, Bld 06/15/2017 519* 70 - 99 mg/dL Final  . BUN 06/15/2017 16  6 - 23 mg/dL Final  . Creatinine, Ser 06/15/2017 1.20  0.40 - 1.20 mg/dL Final  . Calcium 06/15/2017 9.8  8.4 - 10.5 mg/dL Final  . GFR 06/15/2017 57.81* >60.00 mL/min Final  . Cholesterol 06/15/2017 114  0 - 200 mg/dL Final   ATP III Classification       Desirable:  < 200 mg/dL               Borderline High:  200 - 239 mg/dL          High:  > = 240 mg/dL  . Triglycerides 06/15/2017 160.0* 0.0 - 149.0 mg/dL Final   Normal:  <150 mg/dLBorderline High:  150 - 199 mg/dL  . HDL 06/15/2017 37.40* >39.00 mg/dL Final  . VLDL 06/15/2017 32.0  0.0 - 40.0 mg/dL Final  .  LDL Cholesterol 06/15/2017 45  0 - 99 mg/dL Final  . Total CHOL/HDL Ratio 06/15/2017 3   Final                  Men          Women1/2 Average Risk     3.4          3.3Average Risk          5.0          4.42X Average Risk          9.6          7.13X Average Risk          15.0          11.0                      . NonHDL 06/15/2017 76.62   Final   NOTE:  Non-HDL goal should be 30 mg/dL higher than patient's LDL goal (i.e. LDL goal of < 70 mg/dL, would have non-HDL goal of < 100 mg/dL)  . Hgb A1c MFr Bld 06/15/2017 15.1* 4.6 - 6.5 % Final   Glycemic Control Guidelines for People with Diabetes:Non Diabetic:  <6%Goal of Therapy: <7%Additional Action Suggested:  >8%     Physical Examination:  BP 136/80 (BP Location: Left Arm, Patient Position: Sitting, Cuff Size: Normal)   Pulse 100   Ht '5\' 3"'  (1.6 m)    Wt 188 lb (85.3 kg)   SpO2 98%   BMI 33.30 kg/m   GENERAL:         Patient has generalized obesity.    HEENT:         Eye exam shows normal external appearance.  Fundus exam shows no retinopathy.  Oral exam shows normal mucosa .  NECK:   There is no lymphadenopathy Thyroid is not enlarged and no nodules felt.  Carotids are normal to palpation and no bruit heard  LUNGS:         Chest is symmetrical. Lungs are clear to auscultation.Marland Kitchen   HEART:         Heart sounds:  S1 and S2 are normal. No murmur or click heard., no S3 or S4.   ABDOMEN:   There is no distention present. Liver and spleen are not palpable. No other mass or tenderness present.    NEUROLOGICAL:   Ankle jerks are 1+ on the right and decreased on the left.    Diabetic Foot Exam - Simple   Simple Foot Form Diabetic Foot exam was performed with the following findings:  Yes   Visual Inspection No deformities, no ulcerations, no other skin breakdown bilaterally:  Yes Sensation Testing Intact to touch and monofilament testing bilaterally:  Yes Pulse Check Posterior Tibialis and Dorsalis pulse intact bilaterally:  Yes Comments            Vibration sense is moderately reduced in distal first toes. MUSCULOSKELETAL:  There is no swelling or deformity of the peripheral joints.     EXTREMITIES:     There is no edema.  SKIN:       No rash or lesions of concern.        ASSESSMENT:  Diabetes type 2, uncontrolled Current BMI 33 She has marked hyperglycemia and persistent A1c elevation around 15%  With taking 100 units of basal insulin her blood sugars are not controlled at all and averaging at least 400 at home She is only on monotherapy  with basal insulin currently Surprisingly she is not very symptomatic or losing weight with this Her diet can be significantly better also and she needs some exercise which she is not doing Has had minimal diabetes education   She should benefit from an insulin sensitizer and also a  combination of basal and bolus insulin especially to reduce glucose toxicity She also needs to monitor blood sugars multiple frequently especially after meals and bring her monitor for her download consistently  Complications of diabetes: Probable peripheral neuropathy  Hypertension: Well controlled  Diabetic dyslipidemia with control of LDL and low HDL, treated with Crestor   PLAN:    Start HUMULIN R U-500 insulin for improved insulin action as well as control of postprandial hyperglycemia  Discussed in detail how this is different than normal insulin and the timing of taking the medication, dosage regimen and use of the Humalog KwikPen  She will start with 60 in the morning, take 50 before lunch and 40 before dinner to start the  For now she can stop her Lantus in the morning but continue 50 units in the evening  She will benefit considerably from adding metformin and also medication like Invokana  For convenience she can start with [facility] in combination  Discussed action of SGLT 2 drugs on lowering glucose by decreasing kidney absorption of glucose, benefits of weight loss and lower blood pressure, possible side effects including candidiasis and dosage regimen  While starting Invokamet she will take 1 tablet daily for the first 7 days and then 2 tablets daily of the 50/1000 doses  Also she can stop her HCTZ to avoid excessive fluid loss or hypotension  She needs to start checking blood sugars at least once or twice a day regularly even after meals which she is not doing and bring her monitor for download on each visit  Consultation to be done with dietitian for meal planning  Meantime she will avoid regular soft drinks, regular desserts or fried food and high fat fast food    Patient Instructions  Check blood sugars on waking up  4/7 days  Also check blood sugars about 2 hours after a meal and do this after different meals by rotation  Recommended blood sugar levels  on waking up is 90-130 and about 2 hours after meal is 130-160  Please bring your blood sugar monitor to each visit, thank you  Cut out sodas and greasy food, cakes  NEW insulin dosing  LANTUS insulin: Stop taking the shot in the morning Continue 50 units in the evening  HUMULIN R U-500 insulin: This needs to be taken about 30 minutes before each meal as follows  Take 60 units before breakfast, 50 units before lunch and 40 units before dinner  INVOKAMET: This is a combination of metformin and Invokana Start taking 1 tablet with breakfast for the first week and then take 2 tablets daily with breakfast, let us know if you have any issues with yeast infections, may only need to use OTC Monistat for this  This will also lower your blood pressure and will need to stop the CHLORTHALIDONE at the same time    Counseling time on subjects discussed in assessment and plan sections is over 50% of today's 60 minute visit  Consultation note has been sent to the referring physician  Deborah Neal 07/27/2017, 5:00 PM   Note: This office note was prepared with Dragon voice recognition system technology. Any transcriptional errors that result from this process are unintentional.

## 2017-08-23 ENCOUNTER — Ambulatory Visit: Payer: Medicare Other | Admitting: Internal Medicine

## 2017-09-02 ENCOUNTER — Other Ambulatory Visit (INDEPENDENT_AMBULATORY_CARE_PROVIDER_SITE_OTHER): Payer: Medicare Other

## 2017-09-02 DIAGNOSIS — E1165 Type 2 diabetes mellitus with hyperglycemia: Secondary | ICD-10-CM | POA: Diagnosis not present

## 2017-09-02 DIAGNOSIS — Z794 Long term (current) use of insulin: Secondary | ICD-10-CM

## 2017-09-02 LAB — BASIC METABOLIC PANEL
BUN: 13 mg/dL (ref 6–23)
CHLORIDE: 104 meq/L (ref 96–112)
CO2: 32 mEq/L (ref 19–32)
Calcium: 9.9 mg/dL (ref 8.4–10.5)
Creatinine, Ser: 0.99 mg/dL (ref 0.40–1.20)
GFR: 72.13 mL/min (ref >60.00)
Glucose, Bld: 63 mg/dL — ABNORMAL LOW (ref 70–99)
POTASSIUM: 3.8 meq/L (ref 3.5–5.1)
Sodium: 143 mEq/L (ref 135–145)

## 2017-09-03 LAB — FRUCTOSAMINE: FRUCTOSAMINE: 337 umol/L — AB (ref 0–285)

## 2017-09-06 ENCOUNTER — Encounter: Payer: Self-pay | Admitting: Endocrinology

## 2017-09-06 ENCOUNTER — Encounter: Payer: Medicare Other | Admitting: Endocrinology

## 2017-09-07 NOTE — Progress Notes (Signed)
This encounter was created in error - please disregard.

## 2017-09-08 ENCOUNTER — Ambulatory Visit (INDEPENDENT_AMBULATORY_CARE_PROVIDER_SITE_OTHER): Payer: Medicare Other | Admitting: Endocrinology

## 2017-09-08 ENCOUNTER — Encounter: Payer: Self-pay | Admitting: Endocrinology

## 2017-09-08 VITALS — BP 124/80 | HR 95 | Ht 63.0 in | Wt 197.6 lb

## 2017-09-08 DIAGNOSIS — E1165 Type 2 diabetes mellitus with hyperglycemia: Secondary | ICD-10-CM

## 2017-09-08 DIAGNOSIS — I10 Essential (primary) hypertension: Secondary | ICD-10-CM | POA: Diagnosis not present

## 2017-09-08 DIAGNOSIS — Z794 Long term (current) use of insulin: Secondary | ICD-10-CM

## 2017-09-08 MED ORDER — INSULIN REGULAR HUMAN (CONC) 500 UNIT/ML ~~LOC~~ SOPN: PEN_INJECTOR | SUBCUTANEOUS | 1 refills | Status: DC

## 2017-09-08 NOTE — Progress Notes (Signed)
Patient ID: Deborah Neal, female   DOB: 11-05-1950, 67 y.o.   MRN: [id number]          Reason for Appointment: Consultation for Type 2 Diabetes  Referring physician: [name]   History of Present Illness:          Date of diagnosis of type 2 diabetes mellitus:        Background history: She was initially started on metformin but apparently since severely since blood sugar not controlled she was switched to insulin in 2018 She has taken various types of insulin in the past including premixed insulin with usually poor control Does not have a history of taking a GLP-1 drug or medication like Invokana Her blood sugars were under fair control until 6/18 when her  A1c went up to 9.2 and subsequently has been higher  Recent history:   INSULIN regimen is:  Humulin R U-500, 60 units meals 3 times daily     Non-insulin hypoglycemic drugs the patient is taking are: None  Her recent A1c was 15.1% in 2/19 and about the same in October 2018  Current management, blood sugar patterns and problems identified:  She has been taking U-500 insulin 3 times a day since her initial consultation in April  She thinks she is trying to take this 30 minutes before eating consistently  Her blood sugars are very significantly better feeling subjectively improved also she is still having overall high readings averaging around 200  She was told to medically improve her diet and cut back on high carbohydrate foods, sweets, sweet drinks better with meal choices but she is not making much headway with lifestyle changes  She has not scheduled dietitian visit has recommended despite being called about this She has gained a significant amount of weight since her last visit She was told to start Invokamet he claims that the pharmacy did not have been stopped and she did not go back to get it, previously not on any oral hypoglycemic drugs        Side effects from medications have been: None  Compliance with the  medical regimen: Fair   Glucose monitoring:  done 1 times a day         Glucometer: One Touch Ultra.       Blood Glucose readings by download of monitor   PREMEAL Breakfast Lunch Dinner Bedtime  Overall   Glucose range:  86-301  89-283     Median:  213  182  211   204+/-63   POST-MEAL PC Breakfast PC Lunch PC Dinner  Glucose range:    126-388  Median:    233   Self-care: The diet that the patient has been following is: None    Typical meal intake: Breakfast is oatmeal or toast and boiled egg, lunch would be fast food sandwich or bologna sandwich Usually has meat and potatoes at dinner most nights will be popcorn, cake or fruit                Dietician visit, most recent: none        Exercise: Starting to do a little walking    Weight history:  Wt Readings from Last 3 Encounters:  09/08/17 197 lb 9.6 oz (89.6 kg)  09/06/17 197 lb 9.6 oz (89.6 kg)  07/27/17 188 lb (85.3 kg)    Glycemic control:   Lab Results  Component Value Date   HGBA1C 15.1 (H) 06/15/2017   HGBA1C 14.9 02/23/2017   HGBA1C  CLOT9 02/21/2017   Lab Results  Component Value Date   MICROALBUR 1.2 09/28/2016   LDLCALC 45 06/15/2017   CREATININE 0.99 09/02/2017   Lab Results  Component Value Date   MICRALBCREAT 0.7 09/28/2016    Lab Results  Component Value Date   FRUCTOSAMINE 337 (H) 09/02/2017      Allergies as of 09/08/2017   No Known Allergies     Medication List        Accurate as of 09/08/17 12:22 PM. Always use your most recent med list.          albuterol 108 (90 Base) MCG/ACT inhaler Commonly known as:  PROVENTIL HFA;VENTOLIN HFA Inhale 2 puffs into the lungs every 6 (six) hours as needed for wheezing or shortness of breath.   alendronate 70 MG tablet Commonly known as:  FOSAMAX TAKE ONE TABLET BY MOUTH ONCE EVERY 7 DAYS. TAKE  WITH  A  FULL  GLASS  OF  WATER  AND  ON  AN  EMPTY  STOMACH   amLODipine 5 MG tablet Commonly known as:  NORVASC Take 1 tablet (5 mg total) by  mouth daily.   aspirin 81 MG tablet Take 81 mg by mouth every morning.   benazepril 20 MG tablet Commonly known as:  LOTENSIN Take 1 tablet (20 mg total) by mouth daily.   Canagliflozin-metFORMIN HCl 50-1000 MG Tabs Commonly known as:  INVOKAMET Take 2 tablets by mouth daily with breakfast.   cyclobenzaprine 5 MG tablet Commonly known as:  FLEXERIL 1-2 qhs prn   glucose blood test strip Commonly known as:  ONE TOUCH ULTRA TEST Use to check blood sugars three times a day   hydrochlorothiazide 25 MG tablet Commonly known as:  HYDRODIURIL Take 1 tablet (25 mg total) by mouth daily.   insulin regular human CONCENTRATED 500 UNIT/ML kwikpen Commonly known as:  HUMULIN R U-500 KWIKPEN 60 units, 30 minutes before each meal   Lancets Misc Use as directed four times daily  E11.9   multivitamin with minerals Tabs tablet Take 1 tablet by mouth daily.   ONE TOUCH ULTRA 2 w/Device Kit Use as directed   potassium chloride 10 MEQ tablet Commonly known as:  K-DUR Take 3 tablets (30 mEq total) by mouth daily.   rosuvastatin 20 MG tablet Commonly known as:  CRESTOR Take 1 tablet (20 mg total) by mouth daily.       Allergies: No Known Allergies  Past Medical History:  Diagnosis Date  . ALLERGIC RHINITIS 01/29/2007  . Arthritis   . ASTHMA 01/29/2007  . ASTHMA, WITH ACUTE EXACERBATION 06/24/2008  . BACK PAIN 01/29/2009  . CHEST PAIN 06/24/2008  . Family history of adverse reaction to anesthesia    Patients daughter has N/V after anesthesia  . GERD (gastroesophageal reflux disease)   . History of shingles   . HYPERLIPIDEMIA 01/29/2007  . HYPERTENSION 01/29/2007  . OSTEOPENIA 10/16/2007  . Type II or unspecified type diabetes mellitus without mention of complication, uncontrolled 11/29/2013    Past Surgical History:  Procedure Laterality Date  . CHOLECYSTECTOMY N/A 09/19/2015   Procedure: LAPAROSCOPIC CHOLECYSTECTOMY WITH INTRAOPERATIVE CHOLANGIOGRAM;  Surgeon: Jackolyn Confer, MD;   Location: [facility];  Service: General;  Laterality: N/A;  . COLONOSCOPY    . TONSILLECTOMY  1956  . VIDEO BRONCHOSCOPY Bilateral 03/28/2014   Procedure: VIDEO BRONCHOSCOPY WITHOUT FLUORO;  Surgeon: Tanda Rockers, MD;  Location: [facility];  Service: Cardiopulmonary;  Laterality: Bilateral;    Family History  Problem Relation Age  of Onset  . Cancer Mother        ? type   . Diabetes Father   . Hypertension Father   . Heart disease Father   . Asthma Father   . Asthma Daughter   . Colon cancer Neg Hx   . Esophageal cancer Neg Hx   . Rectal cancer Neg Hx   . Stomach cancer Neg Hx   . Breast cancer Neg Hx     Social History:  reports that she quit smoking about 14 years ago. She has never used smokeless tobacco. She reports that she does not drink alcohol or use drugs.   Review of Systems   Lipid history: On Crestor for management prescribed by  PCP    Lab Results  Component Value Date   CHOL 114 06/15/2017   HDL 37.40 (L) 06/15/2017   LDLCALC 45 06/15/2017   LDLDIRECT 153.4 01/29/2009   TRIG 160.0 (H) 06/15/2017   CHOLHDL 3 06/15/2017           Hypertension: Well controlled  BP Readings from Last 3 Encounters:  09/08/17 124/80  09/06/17 138/82  07/27/17 136/80    Most recent eye exam was in 12/18  Most recent foot exam: 07/2017   Leg weakness and pains are better recently   LABS:  Lab on 09/02/2017  Component Date Value Ref Range Status  . Fructosamine 09/02/2017 337* 0 - 285 umol/L Final   Comment: Published reference interval for apparently healthy subjects between age 5 and 61 is 31 - 285 umol/L and in a poorly controlled diabetic population is 228 - 563 umol/L with a mean of 396 umol/L.   Marland Kitchen Sodium 09/02/2017 143  135 - 145 mEq/L Final  . Potassium 09/02/2017 3.8  3.5 - 5.1 mEq/L Final  . Chloride 09/02/2017 104  96 - 112 mEq/L Final  . CO2 09/02/2017 32  19 - 32 mEq/L Final  . Glucose, Bld 09/02/2017 63* 70 - 99 mg/dL Final  . BUN 09/02/2017 13   6 - 23 mg/dL Final  . Creatinine, Ser 09/02/2017 0.99  0.40 - 1.20 mg/dL Final  . Calcium 09/02/2017 9.9  8.4 - 10.5 mg/dL Final  . GFR 09/02/2017 72.13  >60.00 mL/min Final    Physical Examination:  BP 124/80 (BP Location: Left Arm, Patient Position: Sitting, Cuff Size: Normal)   Pulse 95   Ht '5\' 3"'  (1.6 m)   Wt 197 lb 9.6 oz (89.6 kg)   SpO2 95%   BMI 35.00 kg/m         ASSESSMENT:  Diabetes type 2, uncontrolled  See history of present illness for detailed discussion of current diabetes management, blood sugar patterns and problems identified  She has generally improved blood sugars with starting the U-500 insulin 3 times daily However she has gained weight Blood sugars are fluctuating significantly in the morning which may be partly related to using the Humulin R but also variable diet as discussed above  Discussed day-to-day management of her diet, insulin timing, adjustment, actions of insulin, need for weight loss and overall level of control today   Hypertension: Well controlled  Probable neuropathy: Her leg weakness and pains are better with improved blood sugar control   PLAN:    Consultation with dietitian to be scheduled  She needs to significantly improve her diet and cut back on higher fat foods and simple sugars  For now she will reduce her morning insulin by 5 units lunchtime sugars are low normal and increase  her lunch and dinnertime doses by 5 units  She will go to the pharmacy to pick up her Invokamet as directed start 2 tablets daily as prescribed  She will in the meantime stop her HCTZ while starting Invokamet  Explained to her blood sugar targets  If she is planning to be skipping a meal for any reason she will take only half the insulin dose since she had a low sugar of 63 when she came for her labs without eating  May need to adjust insulin more significantly distally if her blood sugars start coming down further with starting and will come  back  Regular exercise with walking and she is also wanting to join the gym at the [facility] at least  Recheck A1c on the next visit  Counseling time on subjects discussed in assessment and plan sections is over 50% of today's 25 minute visit  Patient Instructions  Insulin 55 in am, 65 at lunch and dinner  Get Invokamet rx and stop HCTZ  Check blood sugars on waking up  3-4/7  Also check blood sugars about 2 hours after a meal and do this after different meals by rotation  Recommended blood sugar levels on waking up is 90-130 and about 2 hours after meal is 130-160  Please bring your blood sugar monitor to each visit, thank you  Walk daily      Elayne Snare 09/08/2017, 12:22 PM   Note: This office note was prepared with Dragon voice recognition system technology. Any transcriptional errors that result from this process are unintentional.

## 2017-09-08 NOTE — Patient Instructions (Addendum)
Insulin 55 in am, 65 at lunch and dinner  Get Invokamet rx and stop HCTZ  Check blood sugars on waking up  3-4/7  Also check blood sugars about 2 hours after a meal and do this after different meals by rotation  Recommended blood sugar levels on waking up is 90-130 and about 2 hours after meal is 130-160  Please bring your blood sugar monitor to each visit, thank you  Walk daily

## 2017-09-30 ENCOUNTER — Encounter: Payer: Self-pay | Admitting: Dietician

## 2017-09-30 ENCOUNTER — Encounter: Payer: Medicare Other | Attending: Endocrinology | Admitting: Dietician

## 2017-09-30 DIAGNOSIS — E1165 Type 2 diabetes mellitus with hyperglycemia: Secondary | ICD-10-CM | POA: Diagnosis not present

## 2017-09-30 DIAGNOSIS — K219 Gastro-esophageal reflux disease without esophagitis: Secondary | ICD-10-CM | POA: Insufficient documentation

## 2017-09-30 DIAGNOSIS — Z794 Long term (current) use of insulin: Secondary | ICD-10-CM | POA: Insufficient documentation

## 2017-09-30 DIAGNOSIS — E119 Type 2 diabetes mellitus without complications: Secondary | ICD-10-CM

## 2017-09-30 DIAGNOSIS — Z713 Dietary counseling and surveillance: Secondary | ICD-10-CM | POA: Diagnosis not present

## 2017-09-30 DIAGNOSIS — Z6833 Body mass index (BMI) 33.0-33.9, adult: Secondary | ICD-10-CM | POA: Insufficient documentation

## 2017-09-30 DIAGNOSIS — I10 Essential (primary) hypertension: Secondary | ICD-10-CM | POA: Diagnosis not present

## 2017-09-30 DIAGNOSIS — E785 Hyperlipidemia, unspecified: Secondary | ICD-10-CM | POA: Diagnosis not present

## 2017-09-30 NOTE — Progress Notes (Signed)
Diabetes Self-Management Education  Visit Type: First/Initial  Appt. Start Time: 1115 Appt. End Time: 6160  09/30/2017  [name], identified by name and date of birth, is a 67 y.o. female with a diagnosis of Diabetes: Type 2. Other history includes HTN, hyperlipidemia, GERD.  Her weight 195 lbs overall has been increasing and she reports that is was in the 170's in 2017.   Medications include Humulin R U-500 60 units tid before meals although per MD note was to take this differently.  Her A1C has been over 15%.  Patient's granddaughter lives with patient.  They share shopping and cooking, eat out or simply.  She is retired from [employer] in housekeeping but has been feeling bored and is thinking of finding more work.  She only walks 1 day per week and states that she can do more.  Her diet is high in refined sugar from regular soda although enjoys lemon water.  ASSESSMENT  Height 5\' 4"  (1.626 m), weight 195 lb (88.5 kg). Body mass index is 33.47 kg/m.  Diabetes Self-Management Education - 09/30/17 1133      Visit Information   Visit Type  First/Initial      Initial Visit   Diabetes Type  Type 2    Are you currently following a meal plan?  No    Are you taking your medications as prescribed?  Yes dose is slighytly different than prescribed    Date Diagnosed  2017      Health Coping   How would you rate your overall health?  Good      Psychosocial Assessment   Patient Belief/Attitude about Diabetes  Motivated to manage diabetes    Self-care barriers  None    Self-management support  Doctor's office;Family    Other persons present  Patient    Patient Concerns  Nutrition/Meal planning;Glycemic Control;Problem Solving;Weight Control    Special Needs  None    Preferred Learning Style  No preference indicated    Learning Readiness  Ready    How often do you need to have someone help you when you read instructions, pamphlets, or other written materials from your doctor or pharmacy?   1 - Never    What is the last grade level you completed in school?  10th grade      Pre-Education Assessment   Patient understands the diabetes disease and treatment process.  Needs Instruction    Patient understands incorporating nutritional management into lifestyle.  Needs Instruction    Patient undertands incorporating physical activity into lifestyle.  Needs Instruction    Patient understands using medications safely.  Needs Instruction    Patient understands monitoring blood glucose, interpreting and using results  Needs Instruction    Patient understands prevention, detection, and treatment of acute complications.  Needs Instruction    Patient understands prevention, detection, and treatment of chronic complications.  Needs Instruction    Patient understands how to develop strategies to address psychosocial issues.  Needs Instruction    Patient understands how to develop strategies to promote health/change behavior.  Needs Instruction      Complications   Last HgB A1C per patient/outside source  15.1 % 05/2017    How often do you check your blood sugar?  3-4 times/day    Fasting Blood glucose range (mg/dL)  >200;70-129 290 this am (chicken alfredo and pasta, chocolate last night)    Number of hypoglycemic episodes per month  0    Number of hyperglycemic episodes per week  5    Can you tell when your blood sugar is high?  Yes headache    What do you do if your blood sugar is high?  rests, takes more insulin    Have you had a dilated eye exam in the past 12 months?  Yes    Have you had a dental exam in the past 12 months?  No    Are you checking your feet?  No      Dietary Intake   Breakfast  white toast, margarine, boiled egg    Snack (morning)  none    Lunch  chips     Snack (afternoon)  occasional small amount of chocolate    Dinner  boiled chicken alfredo, broccoli, pasta (2 cups)    Snack (evening)  none    Beverage(s)  regular soda (2 bottles daily), lemon water, occasional  sweet tea, G2 gatorade      Exercise   Exercise Type  Light (walking / raking leaves)    How many days per week to you exercise?  1    How many minutes per day do you exercise?  60    Total minutes per week of exercise  60      Patient Education   Previous Diabetes Education  No    Nutrition management   Role of diet in the treatment of diabetes and the relationship between the three main macronutrients and blood glucose level;Food label reading, portion sizes and measuring food.;Meal options for control of blood glucose level and chronic complications.;Reviewed blood glucose goals for pre and post meals and how to evaluate the patients' food intake on their blood glucose level.    Physical activity and exercise   Role of exercise on diabetes management, blood pressure control and cardiac health.    Medications  Reviewed patients medication for diabetes, action, purpose, timing of dose and side effects.    Monitoring  Purpose and frequency of SMBG.;Identified appropriate SMBG and/or A1C goals.;Daily foot exams FreeStyle Libre    Acute complications  Taught treatment of hypoglycemia - the 15 rule.;Discussed and identified patients' treatment of hyperglycemia.    Chronic complications  Relationship between chronic complications and blood glucose control    Psychosocial adjustment  Role of stress on diabetes    Personal strategies to promote health  Lifestyle issues that need to be addressed for better diabetes care      Individualized Goals (developed by patient)   Nutrition  General guidelines for healthy choices and portions discussed    Physical Activity  Exercise 5-7 days per week;30 minutes per day    Medications  take my medication as prescribed    Monitoring   test my blood glucose as discussed    Reducing Risk  examine blood glucose patterns    Health Coping  discuss diabetes with (comment) MD,RD,CDE      Post-Education Assessment   Patient understands the diabetes disease and  treatment process.  Demonstrates understanding / competency    Patient understands incorporating nutritional management into lifestyle.  Needs Review    Patient undertands incorporating physical activity into lifestyle.  Demonstrates understanding / competency    Patient understands using medications safely.  Demonstrates understanding / competency    Patient understands monitoring blood glucose, interpreting and using results  Demonstrates understanding / competency    Patient understands prevention, detection, and treatment of acute complications.  Demonstrates understanding / competency    Patient understands prevention, detection, and treatment of chronic complications.  Demonstrates understanding / competency    Patient understands how to develop strategies to address psychosocial issues.  Demonstrates understanding / competency    Patient understands how to develop strategies to promote health/change behavior.  Needs Review      Outcomes   Expected Outcomes  Demonstrated interest in learning. Expect positive outcomes    Future DMSE  4-6 wks    Program Status  Not Completed       Individualized Plan for Diabetes Self-Management Training:   Learning Objective:  Patient will have a greater understanding of diabetes self-management. Patient education plan is to attend individual and/or group sessions per assessed needs and concerns.   Plan:   Patient Instructions  Rethink what you drink.  Choose lemon water instead of regular soda and sweet tea. Move more- walk daily for at least 30 minutes- consider Silver Sneakers Avoid skipping lunch.  Choose salad with beans, fruit OR 1/2 sandwich and fruit Take your insulin before each meal:  Per Dr. Dwyane Dee:  55 units before breakfast and 65 units before lunch and dinner.  Plan:  Aim for 2-3 Carb Choices per meal (30-45 grams) +/- 1 either way  Aim for 0-1 Carbs per snack if hungry  Include protein in moderation with your meals and  snacks Consider reading food labels for Total Carbohydrate and Fat Grams of foods Consider checking BG at alternate times per day as directed by MD  Consider taking medication as directed by MD       Expected Outcomes:  Demonstrated interest in learning. Expect positive outcomes  Education material provided: ADA Diabetes: Your Take Control Guide, Meal plan card, My Plate and Snack sheet  If problems or questions, patient to contact team via:  Phone  Future DSME appointment: 4-6 wks

## 2017-09-30 NOTE — Patient Instructions (Signed)
Rethink what you drink.  Choose lemon water instead of regular soda and sweet tea. Move more- walk daily for at least 30 minutes- consider Silver Sneakers Avoid skipping lunch.  Choose salad with beans, fruit OR 1/2 sandwich and fruit Take your insulin before each meal:  Per Dr. Dwyane Dee:  55 units before breakfast and 65 units before lunch and dinner.  Plan:  Aim for 2-3 Carb Choices per meal (30-45 grams) +/- 1 either way  Aim for 0-1 Carbs per snack if hungry  Include protein in moderation with your meals and snacks Consider reading food labels for Total Carbohydrate and Fat Grams of foods Consider checking BG at alternate times per day as directed by MD  Consider taking medication as directed by MD

## 2017-11-04 ENCOUNTER — Other Ambulatory Visit (INDEPENDENT_AMBULATORY_CARE_PROVIDER_SITE_OTHER): Payer: Medicare Other

## 2017-11-04 DIAGNOSIS — E1165 Type 2 diabetes mellitus with hyperglycemia: Secondary | ICD-10-CM

## 2017-11-04 DIAGNOSIS — Z794 Long term (current) use of insulin: Secondary | ICD-10-CM | POA: Diagnosis not present

## 2017-11-04 LAB — COMPREHENSIVE METABOLIC PANEL
ALBUMIN: 4.2 g/dL (ref 3.5–5.2)
ALT: 27 U/L (ref 0–35)
AST: 27 U/L (ref 0–37)
Alkaline Phosphatase: 65 U/L (ref 39–117)
BILIRUBIN TOTAL: 0.3 mg/dL (ref 0.2–1.2)
BUN: 19 mg/dL (ref 6–23)
CALCIUM: 9.6 mg/dL (ref 8.4–10.5)
CO2: 31 mEq/L (ref 19–32)
Chloride: 100 mEq/L (ref 96–112)
Creatinine, Ser: 0.99 mg/dL (ref 0.40–1.20)
GFR: 72.09 mL/min (ref >60.00)
Glucose, Bld: 201 mg/dL — ABNORMAL HIGH (ref 70–99)
Potassium: 3.7 mEq/L (ref 3.5–5.1)
Sodium: 139 mEq/L (ref 135–145)
Total Protein: 7.2 g/dL (ref 6.0–8.3)

## 2017-11-04 LAB — MICROALBUMIN / CREATININE URINE RATIO
Creatinine,U: 206.1 mg/dL
MICROALB UR: 1 mg/dL (ref 0.0–1.9)
Microalb Creat Ratio: 0.5 mg/g (ref 0.0–30.0)

## 2017-11-04 LAB — HEMOGLOBIN A1C: HEMOGLOBIN A1C: 8.7 % — AB (ref 4.6–6.5)

## 2017-11-06 NOTE — Progress Notes (Signed)
Patient ID: Deborah Neal, female   DOB: July 29, 1950, 67 y.o.   MRN: [id number]          Reason for Appointment:  for Type 2 Diabetes  Referring physician: [name]   History of Present Illness:          Date of diagnosis of type 2 diabetes mellitus:        Background history: She was initially started on metformin but apparently since severely since blood sugar not controlled she was switched to insulin in 2018 She has taken various types of insulin in the past including premixed insulin with usually poor control Does not have a history of taking a GLP-1 drug or medication like Invokana Her blood sugars were under fair control until 6/18 when her  A1c went up to 9.2 and subsequently has been higher  Recent history:   INSULIN regimen is:  Humulin R U-500, 60 units meals 3 times daily     Non-insulin hypoglycemic drugs the patient is taking are: None  Her previous A1c was 15.1% in 2/19 and now is down to 8.7   Current management, blood sugar patterns and problems identified:  She has apparently not been taking Invokamet and she did not let us know about this, also pharmacy did not notify us that it was not covered  However with taking U-500 insulin her blood sugars are overall fairly good  She did not bring her meter and not clear if she has any consistent abnormal or consistent blood sugar patterns  She thinks her blood sugar fluctuates and cannot be sure if it is consistently high at any given time of the day  Reportedly her blood sugars are not high after supper but not clear if she checks the regularly  Also even though she thinks her sugars may be relatively higher on lunchtime they can be around 150 also  Fasting readings are occasionally below 100 but sometimes 200 also  She has seen the dietitian last month but not clear if she has made many changes, generally tends to have a relatively high fat diet  Despite reminders she has not started an exercise program    Side effects from medications have been: None  Compliance with the medical regimen: Fair   Glucose monitoring:  done 1 times a day         Glucometer: One Touch Ultra.       Blood Glucose readings by recall as above   Self-care: The diet that the patient has been following is: None    Typical meal intake: Breakfast is oatmeal or toast and boiled egg, lunch would be fast food sandwich or meat sandwich Usually has meat and potatoes at dinner most nights will be popcorn, cake or fruit                Dietician visit, most recent: 6/19        Exercise:  a little walking    Weight history:  Wt Readings from Last 3 Encounters:  11/07/17 196 lb 12.8 oz (89.3 kg)  09/30/17 195 lb (88.5 kg)  09/08/17 197 lb 9.6 oz (89.6 kg)    Glycemic control:   Lab Results  Component Value Date   HGBA1C 8.7 (H) 11/04/2017   HGBA1C 15.1 (H) 06/15/2017   HGBA1C 14.9 02/23/2017   Lab Results  Component Value Date   MICROALBUR 1.0 11/04/2017   LDLCALC 45 06/15/2017   CREATININE 0.99 11/04/2017   Lab Results  Component Value Date  MICRALBCREAT 0.5 11/04/2017    Lab Results  Component Value Date   FRUCTOSAMINE 337 (H) 09/02/2017   Lab on 11/04/2017  Component Date Value Ref Range Status  . Microalb, Ur 11/04/2017 1.0  0.0 - 1.9 mg/dL Final  . Creatinine,U 11/04/2017 206.1  mg/dL Final  . Microalb Creat Ratio 11/04/2017 0.5  0.0 - 30.0 mg/g Final  . Sodium 11/04/2017 139  135 - 145 mEq/L Final  . Potassium 11/04/2017 3.7  3.5 - 5.1 mEq/L Final  . Chloride 11/04/2017 100  96 - 112 mEq/L Final  . CO2 11/04/2017 31  19 - 32 mEq/L Final  . Glucose, Bld 11/04/2017 201* 70 - 99 mg/dL Final  . BUN 11/04/2017 19  6 - 23 mg/dL Final  . Creatinine, Ser 11/04/2017 0.99  0.40 - 1.20 mg/dL Final  . Total Bilirubin 11/04/2017 0.3  0.2 - 1.2 mg/dL Final  . Alkaline Phosphatase 11/04/2017 65  39 - 117 U/L Final  . AST 11/04/2017 27  0 - 37 U/L Final  . ALT 11/04/2017 27  0 - 35 U/L Final  .  Total Protein 11/04/2017 7.2  6.0 - 8.3 g/dL Final  . Albumin 11/04/2017 4.2  3.5 - 5.2 g/dL Final  . Calcium 11/04/2017 9.6  8.4 - 10.5 mg/dL Final  . GFR 11/04/2017 72.09  >60.00 mL/min Final  . Hgb A1c MFr Bld 11/04/2017 8.7* 4.6 - 6.5 % Final   Glycemic Control Guidelines for People with Diabetes:Non Diabetic:  <6%Goal of Therapy: <7%Additional Action Suggested:  >8%       Allergies as of 11/07/2017   No Known Allergies     Medication List        Accurate as of 11/07/17 10:34 AM. Always use your most recent med list.          albuterol 108 (90 Base) MCG/ACT inhaler Commonly known as:  PROVENTIL HFA;VENTOLIN HFA Inhale 2 puffs into the lungs every 6 (six) hours as needed for wheezing or shortness of breath.   alendronate 70 MG tablet Commonly known as:  FOSAMAX TAKE ONE TABLET BY MOUTH ONCE EVERY 7 DAYS. TAKE  WITH  A  FULL  GLASS  OF  WATER  AND  ON  AN  EMPTY  STOMACH   amLODipine 5 MG tablet Commonly known as:  NORVASC Take 1 tablet (5 mg total) by mouth daily.   aspirin 81 MG tablet Take 81 mg by mouth every morning.   benazepril 20 MG tablet Commonly known as:  LOTENSIN Take 1 tablet (20 mg total) by mouth daily.   Canagliflozin-metFORMIN HCl 50-1000 MG Tabs Commonly known as:  INVOKAMET Take 2 tablets by mouth daily with breakfast.   cyclobenzaprine 5 MG tablet Commonly known as:  FLEXERIL 1-2 qhs prn   glucose blood test strip Commonly known as:  ONE TOUCH ULTRA TEST Use to check blood sugars three times a day   hydrochlorothiazide 25 MG tablet Commonly known as:  HYDRODIURIL Take 1 tablet (25 mg total) by mouth daily.   insulin regular human CONCENTRATED 500 UNIT/ML kwikpen Commonly known as:  HUMULIN R U-500 KWIKPEN 60 units, 30 minutes before each meal   Lancets Misc Use as directed four times daily  E11.9   multivitamin with minerals Tabs tablet Take 1 tablet by mouth daily.   ONE TOUCH ULTRA 2 w/Device Kit Use as directed   potassium  chloride 10 MEQ tablet Commonly known as:  K-DUR Take 3 tablets (30 mEq total) by mouth daily.  rosuvastatin 20 MG tablet Commonly known as:  CRESTOR Take 1 tablet (20 mg total) by mouth daily.       Allergies: No Known Allergies  Past Medical History:  Diagnosis Date  . ALLERGIC RHINITIS 01/29/2007  . Arthritis   . ASTHMA 01/29/2007  . ASTHMA, WITH ACUTE EXACERBATION 06/24/2008  . BACK PAIN 01/29/2009  . CHEST PAIN 06/24/2008  . Family history of adverse reaction to anesthesia    Patients daughter has N/V after anesthesia  . GERD (gastroesophageal reflux disease)   . History of shingles   . HYPERLIPIDEMIA 01/29/2007  . HYPERTENSION 01/29/2007  . OSTEOPENIA 10/16/2007  . Type II or unspecified type diabetes mellitus without mention of complication, uncontrolled 11/29/2013    Past Surgical History:  Procedure Laterality Date  . CHOLECYSTECTOMY N/A 09/19/2015   Procedure: LAPAROSCOPIC CHOLECYSTECTOMY WITH INTRAOPERATIVE CHOLANGIOGRAM;  Surgeon: Jackolyn Confer, MD;  Location: [facility];  Service: General;  Laterality: N/A;  . COLONOSCOPY    . TONSILLECTOMY  1956  . VIDEO BRONCHOSCOPY Bilateral 03/28/2014   Procedure: VIDEO BRONCHOSCOPY WITHOUT FLUORO;  Surgeon: Tanda Rockers, MD;  Location: [facility];  Service: Cardiopulmonary;  Laterality: Bilateral;    Family History  Problem Relation Age of Onset  . Cancer Mother        ? type   . Diabetes Father   . Hypertension Father   . Heart disease Father   . Asthma Father   . Asthma Daughter   . Colon cancer Neg Hx   . Esophageal cancer Neg Hx   . Rectal cancer Neg Hx   . Stomach cancer Neg Hx   . Breast cancer Neg Hx     Social History:  reports that she quit smoking about 14 years ago. She has never used smokeless tobacco. She reports that she does not drink alcohol or use drugs.   Review of Systems   Lipid history: On Crestor for management prescribed by  PCP    Lab Results  Component Value Date   CHOL 114 06/15/2017     HDL 37.40 (L) 06/15/2017   LDLCALC 45 06/15/2017   LDLDIRECT 153.4 01/29/2009   TRIG 160.0 (H) 06/15/2017   CHOLHDL 3 06/15/2017           Hypertension: Well controlled  BP Readings from Last 3 Encounters:  11/07/17 138/80  09/08/17 124/80  09/06/17 138/82    Most recent eye exam was in 12/18  Most recent foot exam: 07/2017   Leg weakness and pains are better recently   LABS:  Lab on 11/04/2017  Component Date Value Ref Range Status  . Microalb, Ur 11/04/2017 1.0  0.0 - 1.9 mg/dL Final  . Creatinine,U 11/04/2017 206.1  mg/dL Final  . Microalb Creat Ratio 11/04/2017 0.5  0.0 - 30.0 mg/g Final  . Sodium 11/04/2017 139  135 - 145 mEq/L Final  . Potassium 11/04/2017 3.7  3.5 - 5.1 mEq/L Final  . Chloride 11/04/2017 100  96 - 112 mEq/L Final  . CO2 11/04/2017 31  19 - 32 mEq/L Final  . Glucose, Bld 11/04/2017 201* 70 - 99 mg/dL Final  . BUN 11/04/2017 19  6 - 23 mg/dL Final  . Creatinine, Ser 11/04/2017 0.99  0.40 - 1.20 mg/dL Final  . Total Bilirubin 11/04/2017 0.3  0.2 - 1.2 mg/dL Final  . Alkaline Phosphatase 11/04/2017 65  39 - 117 U/L Final  . AST 11/04/2017 27  0 - 37 U/L Final  . ALT 11/04/2017 27  0 -  35 U/L Final  . Total Protein 11/04/2017 7.2  6.0 - 8.3 g/dL Final  . Albumin 11/04/2017 4.2  3.5 - 5.2 g/dL Final  . Calcium 11/04/2017 9.6  8.4 - 10.5 mg/dL Final  . GFR 11/04/2017 72.09  >60.00 mL/min Final  . Hgb A1c MFr Bld 11/04/2017 8.7* 4.6 - 6.5 % Final   Glycemic Control Guidelines for People with Diabetes:Non Diabetic:  <6%Goal of Therapy: <7%Additional Action Suggested:  >8%     Physical Examination:  BP 138/80 (BP Location: Left Arm, Patient Position: Sitting, Cuff Size: Normal)   Pulse 82   Ht _0  (1.626 m)   Wt 196 lb 12.8 oz (89.3 kg)   SpO2 98%   BMI 33.78 kg/m         ASSESSMENT:  Diabetes type 2, uncontrolled on insulin  See history of present illness for detailed discussion of current diabetes management, blood sugar patterns  and problems identified  Her A1c is 8.7  Although she has overall much better blood sugars with Humulin R U-500 she probably still has readings around 200 at times She did not bring the monitor and not clear what her blood sugar patterns are Also has not apparently taken Invokamet that was prescribed and she did not let us know about this Only recently has started making some changes in the diet with consultation with dietitian She can also do better with exercise   Hypertension: Fairly well controlled  Probable neuropathy: Her leg weakness and pains are better with improved blood sugar control   PLAN:    She will need to start Invokana  With this she can stop HCTZ  Also discussed that if she has relatively low blood sugars under 90 consistently with starting Invokana and she needs to let us know  Explained blood sugar targets at various times  Start walking, she can join a gym and she wants to do this with her daughter  Follow instructions from dietitian about meal planning    Patient Instructions  Check to see the Invokamet is at home  Increase walking  stop  HCTZ while starting Invokamet     Elayne Snare 11/07/2017, 10:34 AM   Note: This office note was prepared with Dragon voice recognition system technology. Any transcriptional errors that result from this process are unintentional.

## 2017-11-07 ENCOUNTER — Ambulatory Visit (INDEPENDENT_AMBULATORY_CARE_PROVIDER_SITE_OTHER): Payer: Medicare Other | Admitting: Endocrinology

## 2017-11-07 ENCOUNTER — Encounter: Payer: Self-pay | Admitting: Endocrinology

## 2017-11-07 ENCOUNTER — Ambulatory Visit: Payer: Medicare Other | Admitting: Dietician

## 2017-11-07 VITALS — BP 138/80 | HR 82 | Ht 64.0 in | Wt 196.8 lb

## 2017-11-07 DIAGNOSIS — Z794 Long term (current) use of insulin: Secondary | ICD-10-CM | POA: Diagnosis not present

## 2017-11-07 DIAGNOSIS — E1165 Type 2 diabetes mellitus with hyperglycemia: Secondary | ICD-10-CM | POA: Diagnosis not present

## 2017-11-07 NOTE — Patient Instructions (Addendum)
Check to see the Invokamet is at home  Increase walking  stop  HCTZ while starting Invokamet

## 2017-12-07 ENCOUNTER — Other Ambulatory Visit: Payer: Self-pay | Admitting: Internal Medicine

## 2017-12-07 DIAGNOSIS — Z1231 Encounter for screening mammogram for malignant neoplasm of breast: Secondary | ICD-10-CM

## 2017-12-13 ENCOUNTER — Encounter: Payer: Self-pay | Admitting: Internal Medicine

## 2017-12-13 ENCOUNTER — Other Ambulatory Visit (INDEPENDENT_AMBULATORY_CARE_PROVIDER_SITE_OTHER): Payer: Medicare Other

## 2017-12-13 ENCOUNTER — Ambulatory Visit (INDEPENDENT_AMBULATORY_CARE_PROVIDER_SITE_OTHER): Payer: Medicare Other | Admitting: Internal Medicine

## 2017-12-13 VITALS — BP 122/74 | HR 91 | Temp 98.0°F | Ht 64.0 in | Wt 187.0 lb

## 2017-12-13 DIAGNOSIS — Z0000 Encounter for general adult medical examination without abnormal findings: Secondary | ICD-10-CM | POA: Diagnosis not present

## 2017-12-13 DIAGNOSIS — Z23 Encounter for immunization: Secondary | ICD-10-CM | POA: Diagnosis not present

## 2017-12-13 LAB — CBC WITH DIFFERENTIAL/PLATELET
Basophils Absolute: 0 10*3/uL (ref 0.0–0.1)
Basophils Relative: 0.4 % (ref 0.0–3.0)
EOS ABS: 0.1 10*3/uL (ref 0.0–0.7)
EOS PCT: 2 % (ref 0.0–5.0)
HEMATOCRIT: 38.7 % (ref 36.0–46.0)
HEMOGLOBIN: 12.1 g/dL (ref 12.0–15.0)
LYMPHS PCT: 42.8 % (ref 12.0–46.0)
Lymphs Abs: 2.7 10*3/uL (ref 0.7–4.0)
MCHC: 31.3 g/dL (ref 30.0–36.0)
MCV: 73.7 fl — ABNORMAL LOW (ref 78.0–100.0)
MONO ABS: 0.4 10*3/uL (ref 0.1–1.0)
Monocytes Relative: 7 % (ref 3.0–12.0)
Neutro Abs: 3 10*3/uL (ref 1.4–7.7)
Neutrophils Relative %: 47.8 % (ref 43.0–77.0)
Platelets: 234 10*3/uL (ref 150.0–400.0)
RBC: 5.25 Mil/uL — AB (ref 3.87–5.11)
RDW: 14.9 % (ref 11.5–15.5)
WBC: 6.3 10*3/uL (ref 4.0–10.5)

## 2017-12-13 LAB — LIPID PANEL
Cholesterol: 111 mg/dL (ref 0–200)
HDL: 40.6 mg/dL (ref >39.00)
LDL Cholesterol: 43 mg/dL (ref 0–99)
NONHDL: 70.7
Total CHOL/HDL Ratio: 3
Triglycerides: 141 mg/dL (ref 0.0–149.0)
VLDL: 28.2 mg/dL (ref 0.0–40.0)

## 2017-12-13 LAB — URINALYSIS, ROUTINE W REFLEX MICROSCOPIC
Bilirubin Urine: NEGATIVE
Hgb urine dipstick: NEGATIVE
KETONES UR: NEGATIVE
Nitrite: NEGATIVE
PH: 5.5 (ref 5.0–8.0)
RBC / HPF: NONE SEEN (ref 0–?)
SPECIFIC GRAVITY, URINE: 1.02 (ref 1.000–1.030)
Total Protein, Urine: NEGATIVE
UROBILINOGEN UA: 0.2 (ref 0.0–1.0)
Urine Glucose: >=1000 — AB

## 2017-12-13 LAB — BASIC METABOLIC PANEL
BUN: 20 mg/dL (ref 6–23)
CALCIUM: 10.3 mg/dL (ref 8.4–10.5)
CO2: 30 mEq/L (ref 19–32)
Chloride: 101 mEq/L (ref 96–112)
Creatinine, Ser: 1.07 mg/dL (ref 0.40–1.20)
GFR: 65.89 mL/min (ref >60.00)
GLUCOSE: 176 mg/dL — AB (ref 70–99)
Potassium: 3.4 mEq/L — ABNORMAL LOW (ref 3.5–5.1)
Sodium: 139 mEq/L (ref 135–145)

## 2017-12-13 LAB — HEPATIC FUNCTION PANEL
ALT: 27 U/L (ref 0–35)
AST: 20 U/L (ref 0–37)
Albumin: 4.5 g/dL (ref 3.5–5.2)
Alkaline Phosphatase: 73 U/L (ref 39–117)
BILIRUBIN DIRECT: 0 mg/dL (ref 0.0–0.3)
BILIRUBIN TOTAL: 0.3 mg/dL (ref 0.2–1.2)
TOTAL PROTEIN: 7.7 g/dL (ref 6.0–8.3)

## 2017-12-13 LAB — TSH: TSH: 2.24 u[IU]/mL (ref 0.35–4.50)

## 2017-12-13 NOTE — Progress Notes (Signed)
Subjective:    Patient ID: Deborah Neal, female    DOB: 1951/02/06, 67 y.o.   MRN: [id number]  HPI  Here for wellness and f/u;  Overall doing ok;  Pt denies Chest pain, worsening SOB, DOE, wheezing, orthopnea, PND, worsening LE edema, palpitations, dizziness or syncope.  Pt denies neurological change such as new headache, facial or extremity weakness.  Pt denies polydipsia, polyuria, or low sugar symptoms. Pt states overall good compliance with treatment and medications, good tolerability, and has been trying to follow appropriate diet.  Pt denies worsening depressive symptoms, suicidal ideation or panic. No fever, night sweats, wt loss, loss of appetite, or other constitutional symptoms.  Pt states good ability with ADL's, has low fall risk, home safety reviewed and adequate, no other significant changes in hearing or vision, and only occasionally active with exercise.  Due for optho exam and states will make appt herself Past Medical History:  Diagnosis Date  . ALLERGIC RHINITIS 01/29/2007  . Arthritis   . ASTHMA 01/29/2007  . ASTHMA, WITH ACUTE EXACERBATION 06/24/2008  . BACK PAIN 01/29/2009  . CHEST PAIN 06/24/2008  . Family history of adverse reaction to anesthesia    Patients daughter has N/V after anesthesia  . GERD (gastroesophageal reflux disease)   . History of shingles   . HYPERLIPIDEMIA 01/29/2007  . HYPERTENSION 01/29/2007  . OSTEOPENIA 10/16/2007  . Type II or unspecified type diabetes mellitus without mention of complication, uncontrolled 11/29/2013   Past Surgical History:  Procedure Laterality Date  . CHOLECYSTECTOMY N/A 09/19/2015   Procedure: LAPAROSCOPIC CHOLECYSTECTOMY WITH INTRAOPERATIVE CHOLANGIOGRAM;  Surgeon: Jackolyn Confer, MD;  Location: [facility];  Service: General;  Laterality: N/A;  . COLONOSCOPY    . TONSILLECTOMY  1956  . VIDEO BRONCHOSCOPY Bilateral 03/28/2014   Procedure: VIDEO BRONCHOSCOPY WITHOUT FLUORO;  Surgeon: Tanda Rockers, MD;  Location: [facility];   Service: Cardiopulmonary;  Laterality: Bilateral;    reports that she quit smoking about 14 years ago. She has never used smokeless tobacco. She reports that she does not drink alcohol or use drugs. family history includes Asthma in her daughter and father; Cancer in her mother; Diabetes in her father; Heart disease in her father; Hypertension in her father. No Known Allergies Current Outpatient Medications on File Prior to Visit  Medication Sig Dispense Refill  . albuterol (PROVENTIL HFA;VENTOLIN HFA) 108 (90 Base) MCG/ACT inhaler Inhale 2 puffs into the lungs every 6 (six) hours as needed for wheezing or shortness of breath. 6.7 g 0  . alendronate (FOSAMAX) 70 MG tablet TAKE ONE TABLET BY MOUTH ONCE EVERY 7 DAYS. TAKE  WITH  A  FULL  GLASS  OF  WATER  AND  ON  AN  EMPTY  STOMACH 12 tablet 3  . amLODipine (NORVASC) 5 MG tablet Take 1 tablet (5 mg total) by mouth daily. 90 tablet 3  . aspirin 81 MG tablet Take 81 mg by mouth every morning.     . benazepril (LOTENSIN) 20 MG tablet Take 1 tablet (20 mg total) by mouth daily. 90 tablet 3  . Blood Glucose Monitoring Suppl (ONE TOUCH ULTRA 2) w/Device KIT Use as directed 1 each 0  . Canagliflozin-metFORMIN HCl (INVOKAMET) 50-1000 MG TABS Take 2 tablets by mouth daily with breakfast. 60 tablet 2  . cyclobenzaprine (FLEXERIL) 5 MG tablet 1-2 qhs prn 14 tablet 0  . glucose blood (ONE TOUCH ULTRA TEST) test strip Use to check blood sugars three times a day 300 each 11  .  hydrochlorothiazide (HYDRODIURIL) 25 MG tablet Take 1 tablet (25 mg total) by mouth daily. 90 tablet 3  . insulin regular human CONCENTRATED (HUMULIN R U-500 KWIKPEN) 500 UNIT/ML kwikpen 60 units, 30 minutes before each meal 2 pen 1  . Lancets MISC Use as directed four times daily  E11.9 400 each 11  . Multiple Vitamin (MULTIVITAMIN WITH MINERALS) TABS tablet Take 1 tablet by mouth daily.    . potassium chloride (K-DUR) 10 MEQ tablet Take 3 tablets (30 mEq total) by mouth daily. 270  tablet 3  . rosuvastatin (CRESTOR) 20 MG tablet Take 1 tablet (20 mg total) by mouth daily. 90 tablet 3   No current facility-administered medications on file prior to visit.    Review of Systems Constitutional: Negative for other unusual diaphoresis, sweats, appetite or weight changes HENT: Negative for other worsening hearing loss, ear pain, facial swelling, mouth sores or neck stiffness.   Eyes: Negative for other worsening pain, redness or other visual disturbance.  Respiratory: Negative for other stridor or swelling Cardiovascular: Negative for other palpitations or other chest pain  Gastrointestinal: Negative for worsening diarrhea or loose stools, blood in stool, distention or other pain Genitourinary: Negative for hematuria, flank pain or other change in urine volume.  Musculoskeletal: Negative for myalgias or other joint swelling.  Skin: Negative for other color change, or other wound or worsening drainage.  Neurological: Negative for other syncope or numbness. Hematological: Negative for other adenopathy or swelling Psychiatric/Behavioral: Negative for hallucinations, other worsening agitation, SI, self-injury, or new decreased concentration All other system neg per pt    Objective:   Physical Exam BP 122/74   Pulse 91   Temp 98 F (36.7 C) (Oral)   Ht '5\' 4"'  (1.626 m)   Wt 187 lb (84.8 kg)   SpO2 95%   BMI 32.10 kg/m  VS noted,  Constitutional: Pt is oriented to person, place, and time. Appears well-developed and well-nourished, in no significant distress and comfortable Head: Normocephalic and atraumatic  Eyes: Conjunctivae and EOM are normal. Pupils are equal, round, and reactive to light Right Ear: External ear normal without discharge Left Ear: External ear normal without discharge Nose: Nose without discharge or deformity Mouth/Throat: Oropharynx is without other ulcerations and moist  Neck: Normal range of motion. Neck supple. No JVD present. No tracheal  deviation present or significant neck LA or mass Cardiovascular: Normal rate, regular rhythm, normal heart sounds and intact distal pulses.   Pulmonary/Chest: WOB normal and breath sounds without rales or wheezing  Abdominal: Soft. Bowel sounds are normal. NT. No HSM  Musculoskeletal: Normal range of motion. Exhibits no edema Lymphadenopathy: Has no other cervical adenopathy.  Neurological: Pt is alert and oriented to person, place, and time. Pt has normal reflexes. No cranial nerve deficit. Motor grossly intact, Gait intact Skin: Skin is warm and dry. No rash noted or new ulcerations Psychiatric:  Has normal mood and affect. Behavior is normal without agitation No other exam findings Lab Results  Component Value Date   WBC 4.8 02/22/2017   HGB 11.7 (L) 02/22/2017   HCT 35.2 (L) 02/22/2017   PLT 180 02/22/2017   GLUCOSE 201 (H) 11/04/2017   CHOL 114 06/15/2017   TRIG 160.0 (H) 06/15/2017   HDL 37.40 (L) 06/15/2017   LDLDIRECT 153.4 01/29/2009   LDLCALC 45 06/15/2017   ALT 27 11/04/2017   AST 27 11/04/2017   NA 139 11/04/2017   K 3.7 11/04/2017   CL 100 11/04/2017   CREATININE 0.99 11/04/2017  BUN 19 11/04/2017   CO2 31 11/04/2017   TSH 3.68 09/28/2016   HGBA1C 8.7 (H) 11/04/2017   MICROALBUR 1.0 11/04/2017       Assessment & Plan:

## 2017-12-13 NOTE — Patient Instructions (Signed)
You had the Prevnar pneumonia shot today  Please continue all other medications as before, and refills have been done if requested.  Please have the pharmacy call with any other refills you may need.  Please continue your efforts at being more active, low cholesterol diet, and weight control.  You are otherwise up to date with prevention measures today.  Please keep your appointments with your specialists as you may have planned  Please go to the LAB in the Basement (turn left off the elevator) for the tests to be done today  You will be contacted by phone if any changes need to be made immediately.  Otherwise, you will receive a letter about your results with an explanation, but please check with MyChart first.  Please remember to sign up for MyChart if you have not done so, as this will be important to you in the future with finding out test results, communicating by private email, and scheduling acute appointments online when needed.  Please return in 1 year for your yearly visit, or sooner if needed, with Lab testing done 3-5 days before

## 2017-12-13 NOTE — Assessment & Plan Note (Signed)

## 2017-12-13 NOTE — Addendum Note (Signed)
Addended by: Juliet Rude on: 12/13/2017 01:51 PM   Modules accepted: Orders

## 2017-12-25 ENCOUNTER — Other Ambulatory Visit: Payer: Self-pay | Admitting: Internal Medicine

## 2017-12-25 ENCOUNTER — Other Ambulatory Visit: Payer: Self-pay | Admitting: Endocrinology

## 2018-01-09 ENCOUNTER — Other Ambulatory Visit (INDEPENDENT_AMBULATORY_CARE_PROVIDER_SITE_OTHER): Payer: Medicare Other

## 2018-01-09 DIAGNOSIS — Z794 Long term (current) use of insulin: Secondary | ICD-10-CM

## 2018-01-09 DIAGNOSIS — E1165 Type 2 diabetes mellitus with hyperglycemia: Secondary | ICD-10-CM

## 2018-01-09 LAB — BASIC METABOLIC PANEL
BUN: 22 mg/dL (ref 6–23)
CHLORIDE: 103 meq/L (ref 96–112)
CO2: 31 mEq/L (ref 19–32)
CREATININE: 1.44 mg/dL — AB (ref 0.40–1.20)
Calcium: 9.9 mg/dL (ref 8.4–10.5)
GFR: 46.76 mL/min — AB (ref >60.00)
Glucose, Bld: 144 mg/dL — ABNORMAL HIGH (ref 70–99)
Potassium: 4.1 mEq/L (ref 3.5–5.1)
Sodium: 141 mEq/L (ref 135–145)

## 2018-01-10 LAB — FRUCTOSAMINE: Fructosamine: 277 umol/L (ref 0–285)

## 2018-01-11 ENCOUNTER — Ambulatory Visit (INDEPENDENT_AMBULATORY_CARE_PROVIDER_SITE_OTHER): Payer: Medicare Other | Admitting: Endocrinology

## 2018-01-11 VITALS — BP 112/68 | HR 94 | Resp 20 | Ht 64.0 in | Wt 194.4 lb

## 2018-01-11 DIAGNOSIS — E1165 Type 2 diabetes mellitus with hyperglycemia: Secondary | ICD-10-CM | POA: Diagnosis not present

## 2018-01-11 DIAGNOSIS — N289 Disorder of kidney and ureter, unspecified: Secondary | ICD-10-CM

## 2018-01-11 DIAGNOSIS — Z794 Long term (current) use of insulin: Secondary | ICD-10-CM

## 2018-01-11 NOTE — Patient Instructions (Addendum)
Check blood sugars on waking up  4/7  Also check blood sugars about 2 hours after a meal and do this after different meals by rotation  Recommended blood sugar levels on waking up is 90-130 and about 2 hours after meal is 130-160  Please bring your blood sugar monitor to each visit, thank you   65 units at supper for now  Walk daily  No sweets  STOP HCTZ

## 2018-01-11 NOTE — Progress Notes (Signed)
Patient ID: Deborah Neal, female   DOB: 1950/09/17, 67 y.o.   MRN: [id number]          Reason for Appointment: Follow-up for Type 2 Diabetes  Referring physician: [name]   History of Present Illness:          Date of diagnosis of type 2 diabetes mellitus:        Background history: She was initially started on metformin but apparently since severely since blood sugar not controlled she was switched to insulin in 2018 She has taken various types of insulin in the past including premixed insulin with usually poor control Does not have a history of taking a GLP-1 drug or medication like Invokana Her blood sugars were under fair control until 6/18 when her  A1c went up to 9.2 and subsequently has been higher  Recent history:   INSULIN regimen is:  Humulin R U-500, 60 units meals 3 times daily     Non-insulin hypoglycemic drugs the patient is taking are: None  Her previous A1c was 8.7 Fructosamine is now 277   Current management, blood sugar patterns and problems identified:  She did start taking her Invokamet when she was reminded about this in July and her blood sugars were significantly high at that time  Although her blood sugars are overall better she is having inconsistent in blood sugar control  Checking blood sugar mostly fasting and not clear if she is having high postprandial readings  Also recently on her meter she has somebody else's blood sugars recorded which were over 400  She is not consistent with her diet at all and periodically getting sweet drinks  Weight has gone up slightly  She does do a little walking but only minimal  Without her blood sugar being monitored at night not clear if her evening insulin is appropriate  She takes Humulin R before eating consistently but does not adjust the dose based on her diet or what she is eating         Side effects from medications have been: None  Compliance with the medical regimen: Fair   Glucose  monitoring:  done 1 times a day         Glucometer: One Touch Ultra.       Blood Glucose readings    PRE-MEAL Fasting Lunch Dinner Bedtime Overall  Glucose range:  99-291  110-224  121, 189  179   Mean/median: 155     155    Self-care: The diet that the satient has been following is: None    Typical meal intake: Breakfast is oatmeal or toast and boiled egg, lunch  or meat sandwich Usually has meat and potatoes at dinner most nights will be popcorn,  or fruit                Dietician visit, most recent: 6/19        Exercise:  a little walking    Weight history:  Wt Readings from Last 3 Encounters:  01/11/18 194 lb 6 oz (88.2 kg)  12/13/17 187 lb (84.8 kg)  11/07/17 196 lb 12.8 oz (89.3 kg)    Glycemic control:   Lab Results  Component Value Date   HGBA1C 8.7 (H) 11/04/2017   HGBA1C 15.1 (H) 06/15/2017   HGBA1C 14.9 02/23/2017   Lab Results  Component Value Date   MICROALBUR 1.0 11/04/2017   LDLCALC 43 12/13/2017   CREATININE 1.44 (H) 01/09/2018   Lab Results  Component Value  Date   MICRALBCREAT 0.5 11/04/2017    Lab Results  Component Value Date   FRUCTOSAMINE 277 01/09/2018   FRUCTOSAMINE 337 (H) 09/02/2017   Lab on 01/09/2018  Component Date Value Ref Range Status  . Sodium 01/09/2018 141  135 - 145 mEq/L Final  . Potassium 01/09/2018 4.1  3.5 - 5.1 mEq/L Final  . Chloride 01/09/2018 103  96 - 112 mEq/L Final  . CO2 01/09/2018 31  19 - 32 mEq/L Final  . Glucose, Bld 01/09/2018 144* 70 - 99 mg/dL Final  . BUN 01/09/2018 22  6 - 23 mg/dL Final  . Creatinine, Ser 01/09/2018 1.44* 0.40 - 1.20 mg/dL Final  . Calcium 01/09/2018 9.9  8.4 - 10.5 mg/dL Final  . GFR 01/09/2018 46.76* >60.00 mL/min Final  . Fructosamine 01/09/2018 277  0 - 285 umol/L Final   Comment: Published reference interval for apparently healthy subjects between age 58 and 41 is 27 - 285 umol/L and in a poorly controlled diabetic population is 228 - 563 umol/L with a mean of 396  umol/L.       Allergies as of 01/11/2018   No Known Allergies     Medication List        Accurate as of 01/11/18  2:54 PM. Always use your most recent med list.          albuterol 108 (90 Base) MCG/ACT inhaler Commonly known as:  PROVENTIL HFA;VENTOLIN HFA Inhale 2 puffs into the lungs every 6 (six) hours as needed for wheezing or shortness of breath.   alendronate 70 MG tablet Commonly known as:  FOSAMAX TAKE ONE TABLET BY MOUTH ONCE EVERY 7 DAYS. TAKE  WITH  A  FULL  GLASS  OF  WATER  AND  ON  AN  EMPTY  STOMACH   amLODipine 5 MG tablet Commonly known as:  NORVASC Take 1 tablet (5 mg total) by mouth daily.   aspirin 81 MG tablet Take 81 mg by mouth every morning.   benazepril 20 MG tablet Commonly known as:  LOTENSIN Take 1 tablet (20 mg total) by mouth daily.   cyclobenzaprine 5 MG tablet Commonly known as:  FLEXERIL 1-2 qhs prn   glucose blood test strip Use to check blood sugars three times a day   HUMULIN R U-500 KWIKPEN 500 UNIT/ML kwikpen Generic drug:  insulin regular human CONCENTRATED INJECT 60 UNITS UNDER THE SKIN 30 MINUTES BEFORE EACH MEAL   hydrochlorothiazide 25 MG tablet Commonly known as:  HYDRODIURIL TAKE 1 TABLET(25 MG) BY MOUTH DAILY   INVOKAMET 50-1000 MG Tabs Generic drug:  Canagliflozin-metFORMIN HCl TAKE 2 TABLETS BY MOUTH DAILY WITH BREAKFAST   Lancets Misc Use as directed four times daily  E11.9   multivitamin with minerals Tabs tablet Take 1 tablet by mouth daily.   ONE TOUCH ULTRA 2 w/Device Kit Use as directed   potassium chloride 10 MEQ tablet Commonly known as:  K-DUR TAKE 3 TABLETS(30 MEQ) BY MOUTH DAILY   rosuvastatin 20 MG tablet Commonly known as:  CRESTOR TAKE 1 TABLET(20 MG) BY MOUTH DAILY       Allergies: No Known Allergies  Past Medical History:  Diagnosis Date  . ALLERGIC RHINITIS 01/29/2007  . Arthritis   . ASTHMA 01/29/2007  . ASTHMA, WITH ACUTE EXACERBATION 06/24/2008  . BACK PAIN 01/29/2009  .  CHEST PAIN 06/24/2008  . Family history of adverse reaction to anesthesia    Patients daughter has N/V after anesthesia  . GERD (gastroesophageal reflux disease)   .  History of shingles   . HYPERLIPIDEMIA 01/29/2007  . HYPERTENSION 01/29/2007  . OSTEOPENIA 10/16/2007  . Type II or unspecified type diabetes mellitus without mention of complication, uncontrolled 11/29/2013    Past Surgical History:  Procedure Laterality Date  . CHOLECYSTECTOMY N/A 09/19/2015   Procedure: LAPAROSCOPIC CHOLECYSTECTOMY WITH INTRAOPERATIVE CHOLANGIOGRAM;  Surgeon: Jackolyn Confer, MD;  Location: [place];  Service: General;  Laterality: N/A;  . COLONOSCOPY    . TONSILLECTOMY  1956  . VIDEO BRONCHOSCOPY Bilateral 03/28/2014   Procedure: VIDEO BRONCHOSCOPY WITHOUT FLUORO;  Surgeon: Tanda Rockers, MD;  Location: [facility];  Service: Cardiopulmonary;  Laterality: Bilateral;    Family History  Problem Relation Age of Onset  . Cancer Mother        ? type   . Diabetes Father   . Hypertension Father   . Heart disease Father   . Asthma Father   . Asthma Daughter   . Colon cancer Neg Hx   . Esophageal cancer Neg Hx   . Rectal cancer Neg Hx   . Stomach cancer Neg Hx   . Breast cancer Neg Hx     Social History:  reports that she quit smoking about 14 years ago. She has never used smokeless tobacco. She reports that she does not drink alcohol or use drugs.   Review of Systems   Lipid history: On Crestor for management prescribed by  PCP    Lab Results  Component Value Date   CHOL 111 12/13/2017   HDL 40.60 12/13/2017   LDLCALC 43 12/13/2017   LDLDIRECT 153.4 01/29/2009   TRIG 141.0 12/13/2017   CHOLHDL 3 12/13/2017           Hypertension: Well controlled, she was told to stop HCTZ was starting Invokana but she did not do so No history of edema  BP Readings from Last 3 Encounters:  01/11/18 112/68  12/13/17 122/74  11/07/17 138/80   Lab Results  Component Value Date   CREATININE 1.44 (H)  01/09/2018   CREATININE 1.07 12/13/2017   CREATININE 0.99 11/04/2017    Most recent eye exam was in 12/18  Most recent foot exam: 07/2017  No recent leg pain or weakness   LABS:  Lab on 01/09/2018  Component Date Value Ref Range Status  . Sodium 01/09/2018 141  135 - 145 mEq/L Final  . Potassium 01/09/2018 4.1  3.5 - 5.1 mEq/L Final  . Chloride 01/09/2018 103  96 - 112 mEq/L Final  . CO2 01/09/2018 31  19 - 32 mEq/L Final  . Glucose, Bld 01/09/2018 144* 70 - 99 mg/dL Final  . BUN 01/09/2018 22  6 - 23 mg/dL Final  . Creatinine, Ser 01/09/2018 1.44* 0.40 - 1.20 mg/dL Final  . Calcium 01/09/2018 9.9  8.4 - 10.5 mg/dL Final  . GFR 01/09/2018 46.76* >60.00 mL/min Final  . Fructosamine 01/09/2018 277  0 - 285 umol/L Final   Comment: Published reference interval for apparently healthy subjects between age 67 and 11 is 68 - 285 umol/L and in a poorly controlled diabetic population is 228 - 563 umol/L with a mean of 396 umol/L.     Physical Examination:  BP 112/68 (BP Location: Left Arm, Patient Position: Sitting, Cuff Size: Normal)   Pulse 94   Resp 20   Ht _0  (1.626 m)   Wt 194 lb 6 oz (88.2 kg)   SpO2 97%   BMI 33.36 kg/m         ASSESSMENT:  Diabetes  type 2, uncontrolled on insulin  See history of present illness for detailed discussion of current diabetes management, blood sugar patterns and problems identified  Her A1c is 8.7 in July which was improved Fructosamine is significantly better at 277  She has had significant improvement in her blood sugar control with adding Invokamet However more recently her fasting readings are going up and this may be due to her inconsistent diet as discussed above Still not consistently motivated to make changes including exercise or glucose monitoring as directed   Hypertension: Blood pressure is low normal with adding Invokana  Renal dysfunction: Her creatinine is relatively higher and she did not stop her HCTZ with  starting Invokana   PLAN:    She will need to start checking blood sugars by rotation at different times of the day including after meals and discussed blood sugar targets at various times  For now she will go up to 65 units of insulin before suppertime  She will need to make sure she takes her insulin 30 minutes before she eats  Because of her renal dysfunction she can stop HCTZ  She does need to start cutting back on sweets, fruits and sweet drinks  She will need to start walking daily and increase exercise for weight loss  Reminded her not to share her meter with any other person for more accurate download    Patient Instructions  Check blood sugars on waking up  4/7  Also check blood sugars about 2 hours after a meal and do this after different meals by rotation  Recommended blood sugar levels on waking up is 90-130 and about 2 hours after meal is 130-160  Please bring your blood sugar monitor to each visit, thank you   65 units at supper for now  Walk daily  No sweets  STOP HCTZ   Counseling time on subjects discussed in assessment and plan sections is over 50% of today's 25 minute visit   Elayne Snare 01/11/2018, 2:54 PM   Note: This office note was prepared with Dragon voice recognition system technology. Any transcriptional errors that result from this process are unintentional.

## 2018-01-22 ENCOUNTER — Other Ambulatory Visit: Payer: Self-pay | Admitting: Endocrinology

## 2018-01-25 LAB — HM DIABETES EYE EXAM

## 2018-02-16 ENCOUNTER — Ambulatory Visit
Admission: RE | Admit: 2018-02-16 | Discharge: 2018-02-16 | Disposition: A | Discharge status: Home / Self Care | Payer: Medicare Other | Source: Ambulatory Visit | Attending: Internal Medicine | Admitting: Internal Medicine

## 2018-02-16 DIAGNOSIS — Z1231 Encounter for screening mammogram for malignant neoplasm of breast: Secondary | ICD-10-CM

## 2018-02-23 ENCOUNTER — Other Ambulatory Visit: Payer: Self-pay | Admitting: Endocrinology

## 2018-03-07 ENCOUNTER — Other Ambulatory Visit: Payer: Self-pay | Admitting: Internal Medicine

## 2018-03-07 DIAGNOSIS — J069 Acute upper respiratory infection, unspecified: Secondary | ICD-10-CM

## 2018-03-07 DIAGNOSIS — B9789 Other viral agents as the cause of diseases classified elsewhere: Principal | ICD-10-CM

## 2018-03-21 ENCOUNTER — Other Ambulatory Visit (INDEPENDENT_AMBULATORY_CARE_PROVIDER_SITE_OTHER): Payer: Medicare Other

## 2018-03-21 ENCOUNTER — Other Ambulatory Visit: Payer: Self-pay | Admitting: Endocrinology

## 2018-03-21 DIAGNOSIS — Z794 Long term (current) use of insulin: Principal | ICD-10-CM

## 2018-03-21 DIAGNOSIS — E1165 Type 2 diabetes mellitus with hyperglycemia: Secondary | ICD-10-CM

## 2018-03-21 DIAGNOSIS — I10 Essential (primary) hypertension: Secondary | ICD-10-CM

## 2018-03-21 LAB — COMPREHENSIVE METABOLIC PANEL
ALT: 24 U/L (ref 0–35)
AST: 25 U/L (ref 0–37)
Albumin: 4.3 g/dL (ref 3.5–5.2)
Alkaline Phosphatase: 57 U/L (ref 39–117)
BUN: 17 mg/dL (ref 6–23)
CHLORIDE: 106 meq/L (ref 96–112)
CO2: 28 mEq/L (ref 19–32)
Calcium: 10 mg/dL (ref 8.4–10.5)
Creatinine, Ser: 1.01 mg/dL (ref 0.40–1.20)
GFR: 70.37 mL/min (ref >60.00)
GLUCOSE: 75 mg/dL (ref 70–99)
Potassium: 3.9 mEq/L (ref 3.5–5.1)
SODIUM: 141 meq/L (ref 135–145)
Total Bilirubin: 0.2 mg/dL (ref 0.2–1.2)
Total Protein: 7.2 g/dL (ref 6.0–8.3)

## 2018-03-21 LAB — HEMOGLOBIN A1C: Hgb A1c MFr Bld: 7 % — ABNORMAL HIGH (ref 4.6–6.5)

## 2018-03-26 NOTE — Progress Notes (Addendum)
Patient ID: Deborah Neal, female   DOB: February 09, 1951, 67 y.o.   MRN: [id number]          Reason for Appointment: Follow-up for Type 2 Diabetes  Referring physician: [name]   History of Present Illness:          Date of diagnosis of type 2 diabetes mellitus:        Background history: She was initially started on metformin but apparently since severely since blood sugar not controlled she was switched to insulin in 2018 She has taken various types of insulin in the past including premixed insulin with usually poor control Does not have a history of taking a GLP-1 drug or medication like Invokana Her blood sugars were under fair control until 6/18 when her  A1c went up to 9.2 and subsequently has been higher  Recent history:   INSULIN regimen is:  Humulin R U-500, 60 units meals 3 times daily, Lantus 90 units in the morning     Non-insulin hypoglycemic drugs the patient is taking are: Invokamet XR 50/1000, 2 tablets daily  Her previous A1c was 8.7 now down to 7  Current management, blood sugar patterns and problems identified:  She was told to increase her evening Humulin R to at least 65 since she had higher fasting blood sugars but she did not do so  She has not changed any of her insulin doses  She was also advised to check sugars after meals but she is mostly checking them in the morning and some lunch once she returned to have low normal or low sugars at lunchtime including a glucose of 73 in the lab  Blood sugars are very similar to in the morning to her last visit; however fasting readings appear to be mostly higher in the last 10 days  She does not think she is consuming as many soft drinks or eating sweets but not clear if she is having good control of her sugars after supper  Has been compliant with Invokamet 50/1000 XR  Her weight is overall about the same  Has had about 15% of her readings about lunch time below 70  She does not always have a protein in the  morning for breakfast         Side effects from medications have been: None  Compliance with the medical regimen: Fair   Glucose monitoring:  done 1 times a day         Glucometer: One Touch Ultra.       Blood Glucose readings    PRE-MEAL Fasting Lunch Dinner Bedtime Overall  Glucose range:  99-205  51-148     Mean/median:   94    156   POST-MEAL PC Breakfast PC Lunch PC Dinner  Glucose range:   ?  Mean/median:      Previously  PRE-MEAL Fasting Lunch Dinner Bedtime Overall  Glucose range:  99-291  110-224  121, 189  179   Mean/median: 155     155    Self-care: The diet that the satient has been following is: None    Typical meal intake: Breakfast is oatmeal or toast and boiled egg, lunch  or meat sandwich  Usually has meat and potatoes at dinner most nights will be popcorn,  or fruit                Dietician visit, most recent: 6/19        Exercise: Minimal, just some walking at times  Weight history:  Wt Readings from Last 3 Encounters:  03/27/18 192 lb 9.6 oz (87.4 kg)  01/11/18 194 lb 6 oz (88.2 kg)  12/13/17 187 lb (84.8 kg)    Glycemic control:   Lab Results  Component Value Date   HGBA1C 7.0 (H) 03/21/2018   HGBA1C 8.7 (H) 11/04/2017   HGBA1C 15.1 (H) 06/15/2017   Lab Results  Component Value Date   MICROALBUR 1.0 11/04/2017   LDLCALC 43 12/13/2017   CREATININE 1.01 03/21/2018   Lab Results  Component Value Date   MICRALBCREAT 0.5 11/04/2017    Lab Results  Component Value Date   FRUCTOSAMINE 277 01/09/2018   FRUCTOSAMINE 337 (H) 09/02/2017   Lab on 03/21/2018  Component Date Value Ref Range Status  . Sodium 03/21/2018 141  135 - 145 mEq/L Final  . Potassium 03/21/2018 3.9  3.5 - 5.1 mEq/L Final  . Chloride 03/21/2018 106  96 - 112 mEq/L Final  . CO2 03/21/2018 28  19 - 32 mEq/L Final  . Glucose, Bld 03/21/2018 75  70 - 99 mg/dL Final  . BUN 03/21/2018 17  6 - 23 mg/dL Final  . Creatinine, Ser 03/21/2018 1.01  0.40 - 1.20 mg/dL  Final  . Total Bilirubin 03/21/2018 0.2  0.2 - 1.2 mg/dL Final  . Alkaline Phosphatase 03/21/2018 57  39 - 117 U/L Final  . AST 03/21/2018 25  0 - 37 U/L Final  . ALT 03/21/2018 24  0 - 35 U/L Final  . Total Protein 03/21/2018 7.2  6.0 - 8.3 g/dL Final  . Albumin 03/21/2018 4.3  3.5 - 5.2 g/dL Final  . Calcium 03/21/2018 10.0  8.4 - 10.5 mg/dL Final  . GFR 03/21/2018 70.37  >60.00 mL/min Final  . Hgb A1c MFr Bld 03/21/2018 7.0* 4.6 - 6.5 % Final   Glycemic Control Guidelines for People with Diabetes:Non Diabetic:  <6%Goal of Therapy: <7%Additional Action Suggested:  >8%       Allergies as of 03/27/2018   No Known Allergies     Medication List        Accurate as of 03/27/18  8:31 PM. Always use your most recent med list.          alendronate 70 MG tablet Commonly known as:  FOSAMAX TAKE ONE TABLET BY MOUTH ONCE EVERY 7 DAYS. TAKE  WITH  A  FULL  GLASS  OF  WATER  AND  ON  AN  EMPTY  STOMACH   amLODipine 5 MG tablet Commonly known as:  NORVASC Take 1 tablet (5 mg total) by mouth daily.   aspirin 81 MG tablet Take 81 mg by mouth every morning.   benazepril 20 MG tablet Commonly known as:  LOTENSIN Take 1 tablet (20 mg total) by mouth daily.   cyclobenzaprine 5 MG tablet Commonly known as:  FLEXERIL 1-2 qhs prn   glucose blood test strip Use to check blood sugars three times a day   HUMULIN R U-500 KWIKPEN 500 UNIT/ML kwikpen Generic drug:  insulin regular human CONCENTRATED INJECT 60 UNITS UNDER THE SKIN 30 MINUTES BEFORE EACH MEAL   INVOKAMET 50-1000 MG Tabs Generic drug:  Canagliflozin-metFORMIN HCl TAKE 2 TABLETS BY MOUTH DAILY WITH BREAKFAST   Lancets Misc Use as directed four times daily  E11.9   LANTUS SOLOSTAR 100 UNIT/ML Solostar Pen Generic drug:  Insulin Glargine INJECT 90 UNITS INTO THE SKIN EVERY DAY SUBCUTANEOUSLY   multivitamin with minerals Tabs tablet Take 1 tablet by mouth daily.  ONE TOUCH ULTRA 2 w/Device Kit Use as directed     potassium chloride 10 MEQ tablet Commonly known as:  K-DUR TAKE 3 TABLETS(30 MEQ) BY MOUTH DAILY   PROAIR HFA 108 (90 Base) MCG/ACT inhaler Generic drug:  albuterol INHALE 2 PUFFS BY MOUTH EVERY 6 HOURS AS NEEDED FOR WHEEZING OR SHORTNESS OF BREATH   rosuvastatin 20 MG tablet Commonly known as:  CRESTOR TAKE 1 TABLET(20 MG) BY MOUTH DAILY       Allergies: No Known Allergies  Past Medical History:  Diagnosis Date  . ALLERGIC RHINITIS 01/29/2007  . Arthritis   . ASTHMA 01/29/2007  . ASTHMA, WITH ACUTE EXACERBATION 06/24/2008  . BACK PAIN 01/29/2009  . CHEST PAIN 06/24/2008  . Family history of adverse reaction to anesthesia    Patients daughter has N/V after anesthesia  . GERD (gastroesophageal reflux disease)   . History of shingles   . HYPERLIPIDEMIA 01/29/2007  . HYPERTENSION 01/29/2007  . OSTEOPENIA 10/16/2007  . Type II or unspecified type diabetes mellitus without mention of complication, uncontrolled 11/29/2013    Past Surgical History:  Procedure Laterality Date  . CHOLECYSTECTOMY N/A 09/19/2015   Procedure: LAPAROSCOPIC CHOLECYSTECTOMY WITH INTRAOPERATIVE CHOLANGIOGRAM;  Surgeon: Jackolyn Confer, MD;  Location: [place];  Service: General;  Laterality: N/A;  . COLONOSCOPY    . TONSILLECTOMY  1956  . VIDEO BRONCHOSCOPY Bilateral 03/28/2014   Procedure: VIDEO BRONCHOSCOPY WITHOUT FLUORO;  Surgeon: Tanda Rockers, MD;  Location: [facility];  Service: Cardiopulmonary;  Laterality: Bilateral;    Family History  Problem Relation Age of Onset  . Cancer Mother        ? type   . Diabetes Father   . Hypertension Father   . Heart disease Father   . Asthma Father   . Asthma Daughter   . Colon cancer Neg Hx   . Esophageal cancer Neg Hx   . Rectal cancer Neg Hx   . Stomach cancer Neg Hx   . Breast cancer Neg Hx     Social History:  reports that she quit smoking about 14 years ago. She has never used smokeless tobacco. She reports that she does not drink alcohol or use  drugs.   Review of Systems   Lipid history: On Crestor for management prescribed by  PCP    Lab Results  Component Value Date   CHOL 111 12/13/2017   HDL 40.60 12/13/2017   LDLCALC 43 12/13/2017   LDLDIRECT 153.4 01/29/2009   TRIG 141.0 12/13/2017   CHOLHDL 3 12/13/2017           Hypertension: Well controlled, she was told to stop HCTZ was starting Invokana  No history of edema  BP Readings from Last 3 Encounters:  03/27/18 128/70  01/11/18 112/68  12/13/17 122/74   Lab Results  Component Value Date   CREATININE 1.01 03/21/2018   CREATININE 1.44 (H) 01/09/2018   CREATININE 1.07 12/13/2017    Most recent eye exam was in 12/18  Most recent foot exam: 07/2017   LABS:  Lab on 03/21/2018  Component Date Value Ref Range Status  . Sodium 03/21/2018 141  135 - 145 mEq/L Final  . Potassium 03/21/2018 3.9  3.5 - 5.1 mEq/L Final  . Chloride 03/21/2018 106  96 - 112 mEq/L Final  . CO2 03/21/2018 28  19 - 32 mEq/L Final  . Glucose, Bld 03/21/2018 75  70 - 99 mg/dL Final  . BUN 03/21/2018 17  6 - 23 mg/dL Final  .  Creatinine, Ser 03/21/2018 1.01  0.40 - 1.20 mg/dL Final  . Total Bilirubin 03/21/2018 0.2  0.2 - 1.2 mg/dL Final  . Alkaline Phosphatase 03/21/2018 57  39 - 117 U/L Final  . AST 03/21/2018 25  0 - 37 U/L Final  . ALT 03/21/2018 24  0 - 35 U/L Final  . Total Protein 03/21/2018 7.2  6.0 - 8.3 g/dL Final  . Albumin 03/21/2018 4.3  3.5 - 5.2 g/dL Final  . Calcium 03/21/2018 10.0  8.4 - 10.5 mg/dL Final  . GFR 03/21/2018 70.37  >60.00 mL/min Final  . Hgb A1c MFr Bld 03/21/2018 7.0* 4.6 - 6.5 % Final   Glycemic Control Guidelines for People with Diabetes:Non Diabetic:  <6%Goal of Therapy: <7%Additional Action Suggested:  >8%     Physical Examination:  BP 128/70 (BP Location: Left Arm, Patient Position: Sitting, Cuff Size: Normal)   Pulse (!) 105   Ht _0  (1.626 m)   Wt 192 lb 9.6 oz (87.4 kg)   SpO2 93%   BMI 33.06 kg/m         ASSESSMENT:  Diabetes  type 2, uncontrolled on insulin  See history of present illness for detailed discussion of current diabetes management, blood sugar patterns and problems identified  Her A1c is again significantly better at 7%  She is currently on a regimen of basal bolus insulin using U-500 insulin, Lantus and Invokamet Drinks requiring large doses of insulin for control  Although her blood sugars are averaging only about 150 she has had readings as high as 205 recently and most of her morning readings are higher She does not check readings after meals as directed again Not clear whether she needs a higher dose of basal insulin or increased regular insulin at suppertime because she does not check readings after supper However since her blood sugars are normal are occasionally low at lunchtime she likely needs only 50 units of the Humulin R U-500 instead of 60 at least She still has difficulty losing weight but is trying to make some improvements in her diet   Hypertension: Blood pressure is normal with adding Invokana and eliminating HCTZ  Renal dysfunction: Her creatinine is back to normal with stopping HCTZ and she has no edema   PLAN:    She will go down to 10 units on her morning dose and up to 10 units on her suppertime dose of the Humulin R U-500  No change in Lantus  Discussed that if her sugars are higher in the morning because of high readings after supper she needs to check these regularly, needs to continue modifying diet with lower caloric intake and eliminating simple sugars completely  Also needs to modify her diet based on blood sugar readings after supper  Encouraged her to be as active as possible  No change in Invokamet Reminded her about her blood sugar targets both fasting and after meals She will call if she has any low readings overnight or for the low sugars during the day   Patient Instructions  MUST CHECK SUGAR AFTER 9 PM ALSO  HUNULIN R  50 IN AM 60 LUNCH AND 70  BEFORE SUPPER   Counseling time on subjects discussed in assessment and plan sections is over 50% of today's 25 minute visit    Elayne Snare 03/27/2018, 8:31 PM   Note: This office note was prepared with Dragon voice recognition system technology. Any transcriptional errors that result from this process are unintentional.

## 2018-03-27 ENCOUNTER — Ambulatory Visit (INDEPENDENT_AMBULATORY_CARE_PROVIDER_SITE_OTHER): Payer: Medicare Other | Admitting: Endocrinology

## 2018-03-27 ENCOUNTER — Encounter: Payer: Self-pay | Admitting: Endocrinology

## 2018-03-27 VITALS — BP 128/70 | HR 105 | Ht 64.0 in | Wt 192.6 lb

## 2018-03-27 DIAGNOSIS — E1165 Type 2 diabetes mellitus with hyperglycemia: Secondary | ICD-10-CM | POA: Diagnosis not present

## 2018-03-27 DIAGNOSIS — Z794 Long term (current) use of insulin: Secondary | ICD-10-CM | POA: Diagnosis not present

## 2018-03-27 DIAGNOSIS — I10 Essential (primary) hypertension: Secondary | ICD-10-CM | POA: Diagnosis not present

## 2018-03-27 DIAGNOSIS — N289 Disorder of kidney and ureter, unspecified: Secondary | ICD-10-CM | POA: Diagnosis not present

## 2018-03-27 NOTE — Patient Instructions (Addendum)
MUST CHECK SUGAR AFTER 9 PM ALSO  HUNULIN R  50 IN AM 60 LUNCH AND 70 BEFORE SUPPER

## 2018-04-06 ENCOUNTER — Other Ambulatory Visit: Payer: Self-pay | Admitting: Internal Medicine

## 2018-04-24 ENCOUNTER — Other Ambulatory Visit: Payer: Self-pay | Admitting: Endocrinology

## 2018-06-09 ENCOUNTER — Other Ambulatory Visit: Payer: Self-pay | Admitting: Endocrinology

## 2018-06-09 ENCOUNTER — Other Ambulatory Visit: Payer: Self-pay | Admitting: Internal Medicine

## 2018-06-23 ENCOUNTER — Other Ambulatory Visit (INDEPENDENT_AMBULATORY_CARE_PROVIDER_SITE_OTHER): Payer: Medicare Other

## 2018-06-23 ENCOUNTER — Other Ambulatory Visit: Payer: Self-pay

## 2018-06-23 ENCOUNTER — Other Ambulatory Visit: Payer: Self-pay | Admitting: Internal Medicine

## 2018-06-23 DIAGNOSIS — Z794 Long term (current) use of insulin: Secondary | ICD-10-CM

## 2018-06-23 DIAGNOSIS — E1165 Type 2 diabetes mellitus with hyperglycemia: Secondary | ICD-10-CM

## 2018-06-23 LAB — BASIC METABOLIC PANEL
BUN: 18 mg/dL (ref 6–23)
CALCIUM: 9.6 mg/dL (ref 8.4–10.5)
CO2: 29 mEq/L (ref 19–32)
Chloride: 106 mEq/L (ref 96–112)
Creatinine, Ser: 0.96 mg/dL (ref 0.40–1.20)
GFR: 70.14 mL/min (ref >60.00)
Glucose, Bld: 137 mg/dL — ABNORMAL HIGH (ref 70–99)
Potassium: 3.6 mEq/L (ref 3.5–5.1)
SODIUM: 144 meq/L (ref 135–145)

## 2018-06-23 LAB — HEMOGLOBIN A1C: Hgb A1c MFr Bld: 7.1 % — ABNORMAL HIGH (ref 4.6–6.5)

## 2018-06-27 ENCOUNTER — Ambulatory Visit (INDEPENDENT_AMBULATORY_CARE_PROVIDER_SITE_OTHER): Payer: Medicare Other | Admitting: Endocrinology

## 2018-06-27 ENCOUNTER — Encounter: Payer: Self-pay | Admitting: Endocrinology

## 2018-06-27 VITALS — BP 140/72 | HR 95 | Ht 64.0 in | Wt 198.6 lb

## 2018-06-27 DIAGNOSIS — E1165 Type 2 diabetes mellitus with hyperglycemia: Secondary | ICD-10-CM

## 2018-06-27 DIAGNOSIS — I10 Essential (primary) hypertension: Secondary | ICD-10-CM

## 2018-06-27 DIAGNOSIS — Z794 Long term (current) use of insulin: Secondary | ICD-10-CM | POA: Diagnosis not present

## 2018-06-27 NOTE — Patient Instructions (Addendum)
Stop am insulin  Take Lantus with dinner  Try take u_500 30 min before meals  30 at lunch and 40 at supper  Brisk walks

## 2018-06-27 NOTE — Progress Notes (Signed)
Patient ID: Deborah Neal, female   DOB: Jun 12, 1950, 68 y.o.   MRN: [id number]          Reason for Appointment: Follow-up for Type 2 Diabetes  Referring physician: [name]   History of Present Illness:          Date of diagnosis of type 2 diabetes mellitus:        Background history: She was initially started on metformin but apparently since severely since blood sugar not controlled she was switched to insulin in 2018 She has taken various types of insulin in the past including premixed insulin with usually poor control Does not have a history of taking a GLP-1 drug or medication like Invokana Her blood sugars were under fair control until 6/18 when her  A1c+ went up to 9.2 and subsequently has been higher  Recent history:   INSULIN regimen is:  Humulin R U-500, 10 units breakfast, 30-30 at lunch and dinner, Lantus 90 units in the morning     Non-insulin hypoglycemic drugs the patient is taking are: Invokamet XR 50/1000, 2 tablets daily  Her A1c is about the same at 7.1 compared to 7% and has been as high as 8.7  Current management, blood sugar patterns and problems identified:  She is taking only half the amount of insulin for her lunch and dinner as previously prescribed  However because of lack of monitoring not clear if her blood sugars are going up after lunch or dinner  She is concerned about her weight gain and has gained 6 pounds, not clear why  As before she is only checking blood sugars at breakfast and lunch  Blood sugars are fairly similar to her last visit at breakfast and lunch  Has sporadic low blood sugars between 11 AM and 1 PM sporadically with lowest reading 45  However FASTING readings are higher but not consistently  She thinks she is taking her Invokamet regularly  Despite reminders she does not do any formal exercise because of being busy with her work and family  No hypoglycemia reported after lunch or dinner        Side effects from  medications have been: None  Compliance with the medical regimen: Fair   Glucose monitoring:  done 1 times a day         Glucometer: One Touch Ultra.       Blood Glucose readings    PRE-MEAL Fasting Lunch Dinner Bedtime Overall  Glucose range:  99-273  45-209     Mean/median: 169 93    153+/-50   Previous readings:  PRE-MEAL Fasting Lunch Dinner Bedtime Overall  Glucose range:  99-205  51-148     Mean/median:   94    156   POST-MEAL PC Breakfast PC Lunch PC Dinner  Glucose range:   ?  Mean/median:         Typical meal intake: Breakfast is oatmeal or toast and boiled egg, lunch  or meat sandwich  Usually has meat and potatoes at dinner at 7 pmmost nights will be popcorn,  or fruit                Dietician visit, most recent: 6/19        Exercise: Minimal, just some walking at times  Weight history:  Wt Readings from Last 3 Encounters:  06/27/18 198 lb 9.6 oz (90.1 kg)  03/27/18 192 lb 9.6 oz (87.4 kg)  01/11/18 194 lb 6 oz (88.2 kg)  Glycemic control:   Lab Results  Component Value Date   HGBA1C 7.1 (H) 06/23/2018   HGBA1C 7.0 (H) 03/21/2018   HGBA1C 8.7 (H) 11/04/2017   Lab Results  Component Value Date   MICROALBUR 1.0 11/04/2017   LDLCALC 43 12/13/2017   CREATININE 0.96 06/23/2018   Lab Results  Component Value Date   MICRALBCREAT 0.5 11/04/2017    Lab Results  Component Value Date   FRUCTOSAMINE 277 01/09/2018   FRUCTOSAMINE 337 (H) 09/02/2017   Lab on 06/23/2018  Component Date Value Ref Range Status  . Sodium 06/23/2018 144  135 - 145 mEq/L Final  . Potassium 06/23/2018 3.6  3.5 - 5.1 mEq/L Final  . Chloride 06/23/2018 106  96 - 112 mEq/L Final  . CO2 06/23/2018 29  19 - 32 mEq/L Final  . Glucose, Bld 06/23/2018 137* 70 - 99 mg/dL Final  . BUN 06/23/2018 18  6 - 23 mg/dL Final  . Creatinine, Ser 06/23/2018 0.96  0.40 - 1.20 mg/dL Final  . Calcium 06/23/2018 9.6  8.4 - 10.5 mg/dL Final  . GFR 06/23/2018 70.14  >60.00 mL/min Final  .  Hgb A1c MFr Bld 06/23/2018 7.1* 4.6 - 6.5 % Final   Glycemic Control Guidelines for People with Diabetes:Non Diabetic:  <6%Goal of Therapy: <7%Additional Action Suggested:  >8%       Allergies as of 06/27/2018   No Known Allergies     Medication List       Accurate as of June 27, 2018 10:19 AM. Always use your most recent med list.        alendronate 70 MG tablet Commonly known as:  FOSAMAX TAKE 1 TABLET BY MOUTH ONCE EVERY 7 DAYS. TAKE WITH A FULL GLASS OF WATER AND ON AN EMPTY STOMACH   amLODipine 5 MG tablet Commonly known as:  NORVASC TAKE 1 TABLET(5 MG) BY MOUTH DAILY   aspirin 81 MG tablet Take 81 mg by mouth every morning.   benazepril 20 MG tablet Commonly known as:  LOTENSIN TAKE 1 TABLET(20 MG) BY MOUTH DAILY   cyclobenzaprine 5 MG tablet Commonly known as:  FLEXERIL 1-2 qhs prn   glucose blood test strip Commonly known as:  ONE TOUCH ULTRA TEST Use to check blood sugars three times a day   HUMULIN R U-500 KWIKPEN 500 UNIT/ML kwikpen Generic drug:  insulin regular human CONCENTRATED ADMINISTER 60 UNITS UNDER THE SKIN 30 MINUTES BEFORE MEALS   INVOKAMET 50-1000 MG Tabs Generic drug:  Canagliflozin-metFORMIN HCl TAKE 2 TABLETS BY MOUTH DAILY WITH BREAKFAST   Lancets Misc Use as directed four times daily  E11.9   LANTUS SOLOSTAR 100 UNIT/ML Solostar Pen Generic drug:  Insulin Glargine INJECT 90 UNITS INTO THE SKIN EVERY DAY SUBCUTANEOUSLY   multivitamin with minerals Tabs tablet Take 1 tablet by mouth daily.   ONE TOUCH ULTRA 2 w/Device Kit Use as directed   potassium chloride 10 MEQ tablet Commonly known as:  K-DUR TAKE 3 TABLETS(30 MEQ) BY MOUTH DAILY   PROAIR HFA 108 (90 Base) MCG/ACT inhaler Generic drug:  albuterol INHALE 2 PUFFS BY MOUTH EVERY 6 HOURS AS NEEDED FOR WHEEZING OR SHORTNESS OF BREATH   rosuvastatin 20 MG tablet Commonly known as:  CRESTOR TAKE 1 TABLET(20 MG) BY MOUTH DAILY       Allergies: No Known Allergies  Past  Medical History:  Diagnosis Date  . ALLERGIC RHINITIS 01/29/2007  . Arthritis   . ASTHMA 01/29/2007  . ASTHMA, WITH ACUTE EXACERBATION 06/24/2008  .  BACK PAIN 01/29/2009  . CHEST PAIN 06/24/2008  . Family history of adverse reaction to anesthesia    Patients daughter has N/V after anesthesia  . GERD (gastroesophageal reflux disease)   . History of shingles   . HYPERLIPIDEMIA 01/29/2007  . HYPERTENSION 01/29/2007  . OSTEOPENIA 10/16/2007  . Type II or unspecified type diabetes mellitus without mention of complication, uncontrolled 11/29/2013    Past Surgical History:  Procedure Laterality Date  . CHOLECYSTECTOMY N/A 09/19/2015   Procedure: LAPAROSCOPIC CHOLECYSTECTOMY WITH INTRAOPERATIVE CHOLANGIOGRAM;  Surgeon: Jackolyn Confer, MD;  Location: [place];  Service: General;  Laterality: N/A;  . COLONOSCOPY    . TONSILLECTOMY  1956  . VIDEO BRONCHOSCOPY Bilateral 03/28/2014   Procedure: VIDEO BRONCHOSCOPY WITHOUT FLUORO;  Surgeon: Tanda Rockers, MD;  Location: [facility];  Service: Cardiopulmonary;  Laterality: Bilateral;    Family History  Problem Relation Age of Onset  . Cancer Mother        ? type   . Diabetes Father   . Hypertension Father   . Heart disease Father   . Asthma Father   . Asthma Daughter   . Colon cancer Neg Hx   . Esophageal cancer Neg Hx   . Rectal cancer Neg Hx   . Stomach cancer Neg Hx   . Breast cancer Neg Hx     Social History:  reports that she quit smoking about 14 years ago. She has never used smokeless tobacco. She reports that she does not drink alcohol or use drugs.   Review of Systems   Lipid history: On Crestor 20 for management prescribed by  PCP    Lab Results  Component Value Date   CHOL 111 12/13/2017   HDL 40.60 12/13/2017   LDLCALC 43 12/13/2017   LDLDIRECT 153.4 01/29/2009   TRIG 141.0 12/13/2017   CHOLHDL 3 12/13/2017           Hypertension: Well controlled,on Norvasc, Lotensin No history of edema  BP Readings from Last 3  Encounters:  06/27/18 140/72  03/27/18 128/70  01/11/18 112/68   Lab Results  Component Value Date   CREATININE 0.96 06/23/2018   CREATININE 1.01 03/21/2018   CREATININE 1.44 (H) 01/09/2018    Most recent eye exam was in 04/2018, no report available  Most recent foot exam: 07/2017   LABS:  Lab on 06/23/2018  Component Date Value Ref Range Status  . Sodium 06/23/2018 144  135 - 145 mEq/L Final  . Potassium 06/23/2018 3.6  3.5 - 5.1 mEq/L Final  . Chloride 06/23/2018 106  96 - 112 mEq/L Final  . CO2 06/23/2018 29  19 - 32 mEq/L Final  . Glucose, Bld 06/23/2018 137* 70 - 99 mg/dL Final  . BUN 06/23/2018 18  6 - 23 mg/dL Final  . Creatinine, Ser 06/23/2018 0.96  0.40 - 1.20 mg/dL Final  . Calcium 06/23/2018 9.6  8.4 - 10.5 mg/dL Final  . GFR 06/23/2018 70.14  >60.00 mL/min Final  . Hgb A1c MFr Bld 06/23/2018 7.1* 4.6 - 6.5 % Final   Glycemic Control Guidelines for People with Diabetes:Non Diabetic:  <6%Goal of Therapy: <7%Additional Action Suggested:  >8%     Physical Examination:  BP 140/72 (BP Location: Left Arm, Patient Position: Sitting, Cuff Size: Normal)   Pulse 95   Ht '5\' 4"'  (1.626 m)   Wt 198 lb 9.6 oz (90.1 kg)   SpO2 94%   BMI 34.09 kg/m         ASSESSMENT:  Diabetes  type 2, uncontrolled on insulin  See history of present illness for detailed discussion of current diabetes management, blood sugar patterns and problems identified  Her A1c is again relatively better at 7.1%  Despite her reportedly taking less insulin at mealtimes than previous prescribed her blood sugars overall are not any higher However she randomly has very high readings up to 273 fasting and not clear why This may be related to her taking large doses of Lantus with variable effect at different times Also likely her compliance with diet, insulin is not consistent also   Hypertension: Blood pressure is normal with adding Invokana and eliminating HCTZ  Hypokalemia: Not present but she  still requires to be requiring a supplement despite not taking HCTZ and continuing Invokana   PLAN:   She will stop the Humulin R in the morning Emphasized the need to check readings after lunch or supper Switch Lantus to the evening Once her Lantus is finished we will switch her to [facility] She will increase her suppertime dose to 40 units empirically To call if having any hypoglycemia Start walking for exercise Consider follow-up with dietitian To get reports of her last eye exam  No change in blood pressure medications or potassium supplement  There are no Patient Instructions on file for this visit.      Elayne Snare 06/27/2018, 10:19 AM   Note: This office note was prepared with Dragon voice recognition system technology. Any transcriptional errors that result from this process are unintentional.

## 2018-06-28 ENCOUNTER — Ambulatory Visit: Payer: Self-pay | Admitting: *Deleted

## 2018-06-28 NOTE — Telephone Encounter (Signed)
Pt reports increased SOB x 2 months. States with exertion only, not at rest. States stops to rest and resolves. States "A little wheeze sometimes."  H/O asthma, uses inhalers, "Not helping much anymore."  Denies dizziness, CP, chest tightness; able to lay flat at HS. Appt made for tomorrow with [name]. Care advise given per protocol, verbalizes understanding.  Reason for Disposition . [1] MILD difficulty breathing (e.g., minimal/no SOB at rest, SOB with walking, pulse <100) AND [2] NEW-onset or WORSE than normal    X 2 months; with exertion only  Answer Assessment - Initial Assessment Questions 1. RESPIRATORY STATUS: "Describe your breathing?" (e.g., wheezing, shortness of breath, unable to speak, severe coughing)      SOB with exertion only 2. ONSET: "When did this breathing problem begin?"      2 months ago 3. PATTERN "Does the difficult breathing come and go, or has it been constant since it started?"     With exertion 4. SEVERITY: "How bad is your breathing?" (e.g., mild, moderate, severe)    - MILD: No SOB at rest, mild SOB with walking, speaks normally in sentences, can lay down, no retractions, pulse < 100.    - MODERATE: SOB at rest, SOB with minimal exertion and prefers to sit, cannot lie down flat, speaks in phrases, mild retractions, audible wheezing, pulse 100-120.    - SEVERE: Very SOB at rest, speaks in single words, struggling to breathe, sitting hunched forward, retractions, pulse > 120     moderate 5. RECURRENT SYMPTOM: "Have you had difficulty breathing before?" If so, ask: "When was the last time?" and "What happened that time?"      H/O asthma 6. CARDIAC HISTORY: "Do you have any history of heart disease?" (e.g., heart attack, angina, bypass surgery, angioplasty)      HTN 7. LUNG HISTORY: "Do you have any history of lung disease?"  (e.g., pulmonary embolus, asthma, emphysema)    asthma 8. CAUSE: "What do you think is causing the breathing problem?"      Asthma 9. OTHER  SYMPTOMS: "Do you have any other symptoms? (e.g., dizziness, runny nose, cough, chest pain, fever)    Cold symptoms 2 weeks ago, not presently  Protocols used: BREATHING DIFFICULTY-A-AH

## 2018-06-29 ENCOUNTER — Ambulatory Visit (INDEPENDENT_AMBULATORY_CARE_PROVIDER_SITE_OTHER)
Admission: RE | Admit: 2018-06-29 | Discharge: 2018-06-29 | Disposition: A | Discharge status: Home / Self Care | Payer: Medicare Other | Source: Ambulatory Visit | Attending: Internal Medicine | Admitting: Internal Medicine

## 2018-06-29 ENCOUNTER — Ambulatory Visit: Payer: Medicare Other | Admitting: Internal Medicine

## 2018-06-29 ENCOUNTER — Encounter: Payer: Self-pay | Admitting: Internal Medicine

## 2018-06-29 VITALS — BP 132/88 | HR 99 | Temp 99.0°F | Ht 64.0 in | Wt 194.0 lb

## 2018-06-29 DIAGNOSIS — J4521 Mild intermittent asthma with (acute) exacerbation: Secondary | ICD-10-CM

## 2018-06-29 DIAGNOSIS — R05 Cough: Secondary | ICD-10-CM

## 2018-06-29 DIAGNOSIS — J45901 Unspecified asthma with (acute) exacerbation: Secondary | ICD-10-CM | POA: Insufficient documentation

## 2018-06-29 DIAGNOSIS — R059 Cough, unspecified: Secondary | ICD-10-CM | POA: Insufficient documentation

## 2018-06-29 DIAGNOSIS — R062 Wheezing: Secondary | ICD-10-CM

## 2018-06-29 DIAGNOSIS — I10 Essential (primary) hypertension: Secondary | ICD-10-CM

## 2018-06-29 DIAGNOSIS — E119 Type 2 diabetes mellitus without complications: Secondary | ICD-10-CM

## 2018-06-29 MED ORDER — AZITHROMYCIN 250 MG PO TABS: ORAL_TABLET | ORAL | 0 refills | Status: DC

## 2018-06-29 MED ORDER — PREDNISONE 10 MG PO TABS: ORAL_TABLET | ORAL | 0 refills | Status: DC

## 2018-06-29 MED ORDER — METHYLPREDNISOLONE ACETATE 80 MG/ML IJ SUSP
80.0000 mg | Freq: Once | INTRAMUSCULAR | Status: AC
  Administered 2018-06-29: 80 mg via INTRAMUSCULAR

## 2018-06-29 NOTE — Progress Notes (Signed)
Subjective:    Patient ID: Deborah Neal, female    DOB: [birth year], 68 y.o.   MRN: [id number]  HPI  Here with acute onset mild to mod 2 wks ST, HA, general weakness and malaise, with prod cough greenish sputum, but Pt denies chest pain, increased sob or doe, wheezing, orthopnea, PND, increased LE swelling, palpitations, dizziness or syncope, except for onset wheezing, sob since last 2 days.  Pt denies new neurological symptoms such as new headache, or facial or extremity weakness or numbness   Pt denies polydipsia, polyuria Past Medical History:  Diagnosis Date  . ALLERGIC RHINITIS 01/29/2007  . Arthritis   . ASTHMA 01/29/2007  . ASTHMA, WITH ACUTE EXACERBATION 06/24/2008  . BACK PAIN 01/29/2009  . CHEST PAIN 06/24/2008  . Family history of adverse reaction to anesthesia    Patients daughter has N/V after anesthesia  . GERD (gastroesophageal reflux disease)   . History of shingles   . HYPERLIPIDEMIA 01/29/2007  . HYPERTENSION 01/29/2007  . OSTEOPENIA 10/16/2007  . Type II or unspecified type diabetes mellitus without mention of complication, uncontrolled 11/29/2013   Past Surgical History:  Procedure Laterality Date  . CHOLECYSTECTOMY N/A 09/19/2015   Procedure: LAPAROSCOPIC CHOLECYSTECTOMY WITH INTRAOPERATIVE CHOLANGIOGRAM;  Surgeon: Jackolyn Confer, MD;  Location: [place];  Service: General;  Laterality: N/A;  . COLONOSCOPY    . TONSILLECTOMY  1956  . VIDEO BRONCHOSCOPY Bilateral 03/28/2014   Procedure: VIDEO BRONCHOSCOPY WITHOUT FLUORO;  Surgeon: Tanda Rockers, MD;  Location: [facility];  Service: Cardiopulmonary;  Laterality: Bilateral;    reports that she quit smoking about 14 years ago. She has never used smokeless tobacco. She reports that she does not drink alcohol or use drugs. family history includes Asthma in her daughter and father; Cancer in her mother; Diabetes in her father; Heart disease in her father; Hypertension in her father. No Known Allergies Current Outpatient  Medications on File Prior to Visit  Medication Sig Dispense Refill  . alendronate (FOSAMAX) 70 MG tablet TAKE 1 TABLET BY MOUTH ONCE EVERY 7 DAYS. TAKE WITH A FULL GLASS OF WATER AND ON AN EMPTY STOMACH 12 tablet 1  . amLODipine (NORVASC) 5 MG tablet TAKE 1 TABLET(5 MG) BY MOUTH DAILY 90 tablet 1  . aspirin 81 MG tablet Take 81 mg by mouth every morning.     . benazepril (LOTENSIN) 20 MG tablet TAKE 1 TABLET(20 MG) BY MOUTH DAILY 90 tablet 3  . Blood Glucose Monitoring Suppl (ONE TOUCH ULTRA 2) w/Device KIT Use as directed 1 each 0  . cyclobenzaprine (FLEXERIL) 5 MG tablet 1-2 qhs prn 14 tablet 0  . glucose blood (ONE TOUCH ULTRA TEST) test strip Use to check blood sugars three times a day 300 each 11  . HUMULIN R U-500 KWIKPEN 500 UNIT/ML kwikpen ADMINISTER 60 UNITS UNDER THE SKIN 30 MINUTES BEFORE MEALS 6 mL 0  . INVOKAMET 50-1000 MG TABS TAKE 2 TABLETS BY MOUTH DAILY WITH BREAKFAST 180 tablet 2  . Lancets MISC Use as directed four times daily  E11.9 400 each 11  . LANTUS SOLOSTAR 100 UNIT/ML Solostar Pen INJECT 90 UNITS INTO THE SKIN EVERY DAY SUBCUTANEOUSLY 15 mL 0  . Multiple Vitamin (MULTIVITAMIN WITH MINERALS) TABS tablet Take 1 tablet by mouth daily.    . potassium chloride (K-DUR) 10 MEQ tablet TAKE 3 TABLETS(30 MEQ) BY MOUTH DAILY 270 tablet 3  . PROAIR HFA 108 (90 Base) MCG/ACT inhaler INHALE 2 PUFFS BY MOUTH EVERY 6 HOURS AS NEEDED  FOR WHEEZING OR SHORTNESS OF BREATH 8.5 g 0  . rosuvastatin (CRESTOR) 20 MG tablet TAKE 1 TABLET(20 MG) BY MOUTH DAILY 90 tablet 3   No current facility-administered medications on file prior to visit.    Review of Systems  Constitutional: Negative for other unusual diaphoresis or sweats HENT: Negative for ear discharge or swelling Eyes: Negative for other worsening visual disturbances Respiratory: Negative for stridor or other swelling  Gastrointestinal: Negative for worsening distension or other blood Genitourinary: Negative for retention or other  urinary change Musculoskeletal: Negative for other MSK pain or swelling Skin: Negative for color change or other new lesions Neurological: Negative for worsening tremors and other numbness  Psychiatric/Behavioral: Negative for worsening agitation or other fatigue All other system neg per pt    Objective:   Physical Exam BP 132/88   Pulse 99   Temp 99 F (37.2 C) (Oral)   Ht '5\' 4"'  (1.626 m)   Wt 194 lb (88 kg)   SpO2 92%   BMI 33.30 kg/m  VS noted, mild ill Constitutional: Pt appears in NAD HENT: Head: NCAT.  Right Ear: External ear normal.  Left Ear: External ear normal.  Eyes: . Pupils are equal, round, and reactive to light. Conjunctivae and EOM are normal Nose: without d/c or deformity Neck: Neck supple. Gross normal ROM Cardiovascular: Normal rate and regular rhythm.   Pulmonary/Chest: Effort normal and breath sounds decreased without rales but with bilat few wheezing.  Neurological: Pt is alert. At baseline orientation, motor grossly intact Skin: Skin is warm. No rashes, other new lesions, no LE edema Psychiatric: Pt behavior is normal without agitation  No other exam findings Lab Results  Component Value Date   WBC 6.3 12/13/2017   HGB 12.1 12/13/2017   HCT 38.7 12/13/2017   PLT 234.0 12/13/2017   GLUCOSE 137 (H) 06/23/2018   CHOL 111 12/13/2017   TRIG 141.0 12/13/2017   HDL 40.60 12/13/2017   LDLDIRECT 153.4 01/29/2009   LDLCALC 43 12/13/2017   ALT 24 03/21/2018   AST 25 03/21/2018   NA 144 06/23/2018   K 3.6 06/23/2018   CL 106 06/23/2018   CREATININE 0.96 06/23/2018   BUN 18 06/23/2018   CO2 29 06/23/2018   TSH 2.24 12/13/2017   HGBA1C 7.1 (H) 06/23/2018   MICROALBUR 1.0 11/04/2017       Assessment & Plan:

## 2018-06-29 NOTE — Patient Instructions (Addendum)
You had the steroid shot today  Please take all new medication as prescribed - the antibiotic, and prednisone  Please continue all other medications as before, including the inhaler  Please have the pharmacy call with any other refills you may need.  Please continue your efforts at being more active, low cholesterol diet, and weight control.  Please keep your appointments with your specialists as you may have planned  Please go to the XRAY Department in the Basement (go straight as you get off the elevator) for the x-ray testing  You will be contacted by phone if any changes need to be made immediately.  Otherwise, you will receive a letter about your results with an explanation, but please check with MyChart first.  Please remember to sign up for MyChart if you have not done so, as this will be important to you in the future with finding out test results, communicating by private email, and scheduling acute appointments online when needed.

## 2018-06-29 NOTE — Assessment & Plan Note (Signed)
stable overall by history and exam, recent data reviewed with pt, and pt to continue medical treatment as before,  to f/u any worsening symptoms or concerns  

## 2018-06-29 NOTE — Assessment & Plan Note (Signed)
Mild to mod, for depomedrol IM 80, predpac asd, to f/u any worsening symptoms or concerns 

## 2018-06-29 NOTE — Assessment & Plan Note (Addendum)
Mild to mod, c/w bronchitis vs pna, for antibx course, cxr, cough med prn,  to f/u any worsening symptoms or concerns

## 2018-06-30 ENCOUNTER — Encounter: Payer: Self-pay | Admitting: Internal Medicine

## 2018-06-30 ENCOUNTER — Other Ambulatory Visit: Payer: Self-pay | Admitting: Internal Medicine

## 2018-06-30 DIAGNOSIS — R911 Solitary pulmonary nodule: Secondary | ICD-10-CM

## 2018-07-03 ENCOUNTER — Telehealth: Payer: Self-pay | Admitting: Internal Medicine

## 2018-07-03 ENCOUNTER — Telehealth: Payer: Self-pay

## 2018-07-03 MED ORDER — GLUCOSE BLOOD VI STRP: ORAL_STRIP | 11 refills | Status: DC

## 2018-07-03 NOTE — Addendum Note (Signed)
Addended by: Juliet Rude on: 07/03/2018 12:39 PM   Modules accepted: Orders

## 2018-07-03 NOTE — Telephone Encounter (Signed)
-----   Message from Biagio Borg, MD sent at 06/30/2018  5:10 PM EST ----- Letter sent, cont same tx except  The test results show that your current treatment is OK, except the xray may show a questionable spot on the lung on the right side.  We should order a Ct scan of the Chest (with contrast) to further evaluate.    Deborah Neal to please inform pt, I will do order

## 2018-07-03 NOTE — Telephone Encounter (Signed)
Copied from [place] [id number]. Topic: Quick Communication - Rx Refill/Question >> Jul 03, 2018 11:12 AM Andria Frames L wrote: Medication: glucose blood (ONE TOUCH ULTRA TEST) test strip [[id number]]   Has the patient contacted their pharmacy? no Preferred Pharmacy (with phone number or street name):[id number] [address], [place] [phone] (Phone) [phone] (Fax)

## 2018-07-03 NOTE — Telephone Encounter (Signed)
Called pt, LVM.   CRM created.  

## 2018-07-10 ENCOUNTER — Other Ambulatory Visit: Payer: Self-pay

## 2018-07-10 ENCOUNTER — Ambulatory Visit (INDEPENDENT_AMBULATORY_CARE_PROVIDER_SITE_OTHER)
Admission: RE | Admit: 2018-07-10 | Discharge: 2018-07-10 | Disposition: A | Discharge status: Home / Self Care | Payer: Medicare Other | Source: Ambulatory Visit | Attending: Internal Medicine | Admitting: Internal Medicine

## 2018-07-10 ENCOUNTER — Encounter: Payer: Self-pay | Admitting: Internal Medicine

## 2018-07-10 DIAGNOSIS — R911 Solitary pulmonary nodule: Secondary | ICD-10-CM | POA: Diagnosis not present

## 2018-07-10 MED ORDER — IOHEXOL 300 MG/ML  SOLN
80.0000 mL | Freq: Once | INTRAMUSCULAR | Status: AC | PRN
  Administered 2018-07-10: 80 mL via INTRAVENOUS

## 2018-07-23 ENCOUNTER — Other Ambulatory Visit: Payer: Self-pay | Admitting: Endocrinology

## 2018-08-14 ENCOUNTER — Other Ambulatory Visit: Payer: Self-pay | Admitting: Endocrinology

## 2018-09-09 IMAGING — CR DG CHEST 2V
2 series · 2 of 2 positions shown · non-contrast
Comparison: Chest radiograph 03/11/2014, CT 03/11/2014

CLINICAL DATA: Low LEFT rare

EXAM:
CHEST  2 VIEW

[w chest pa]
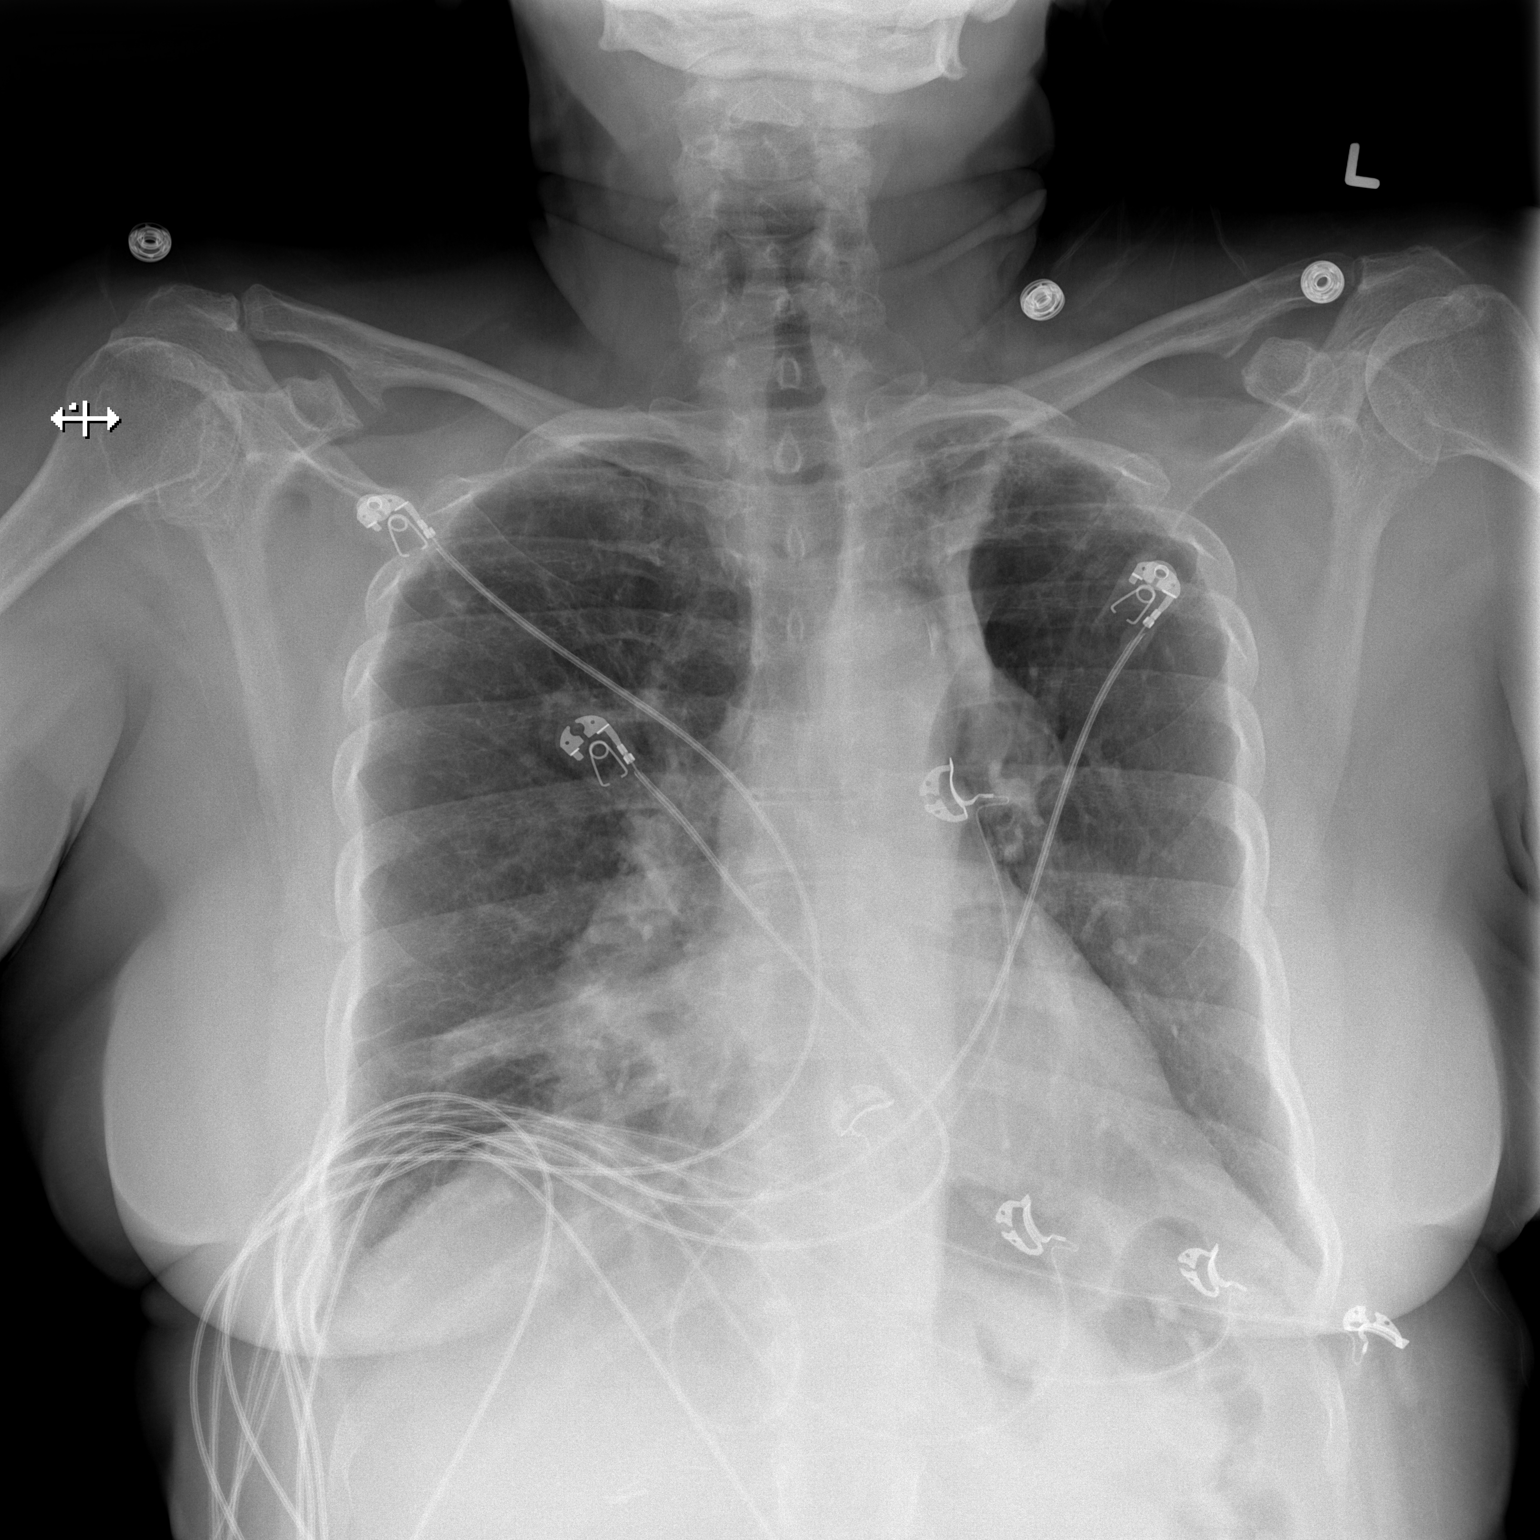

[w chest lat]
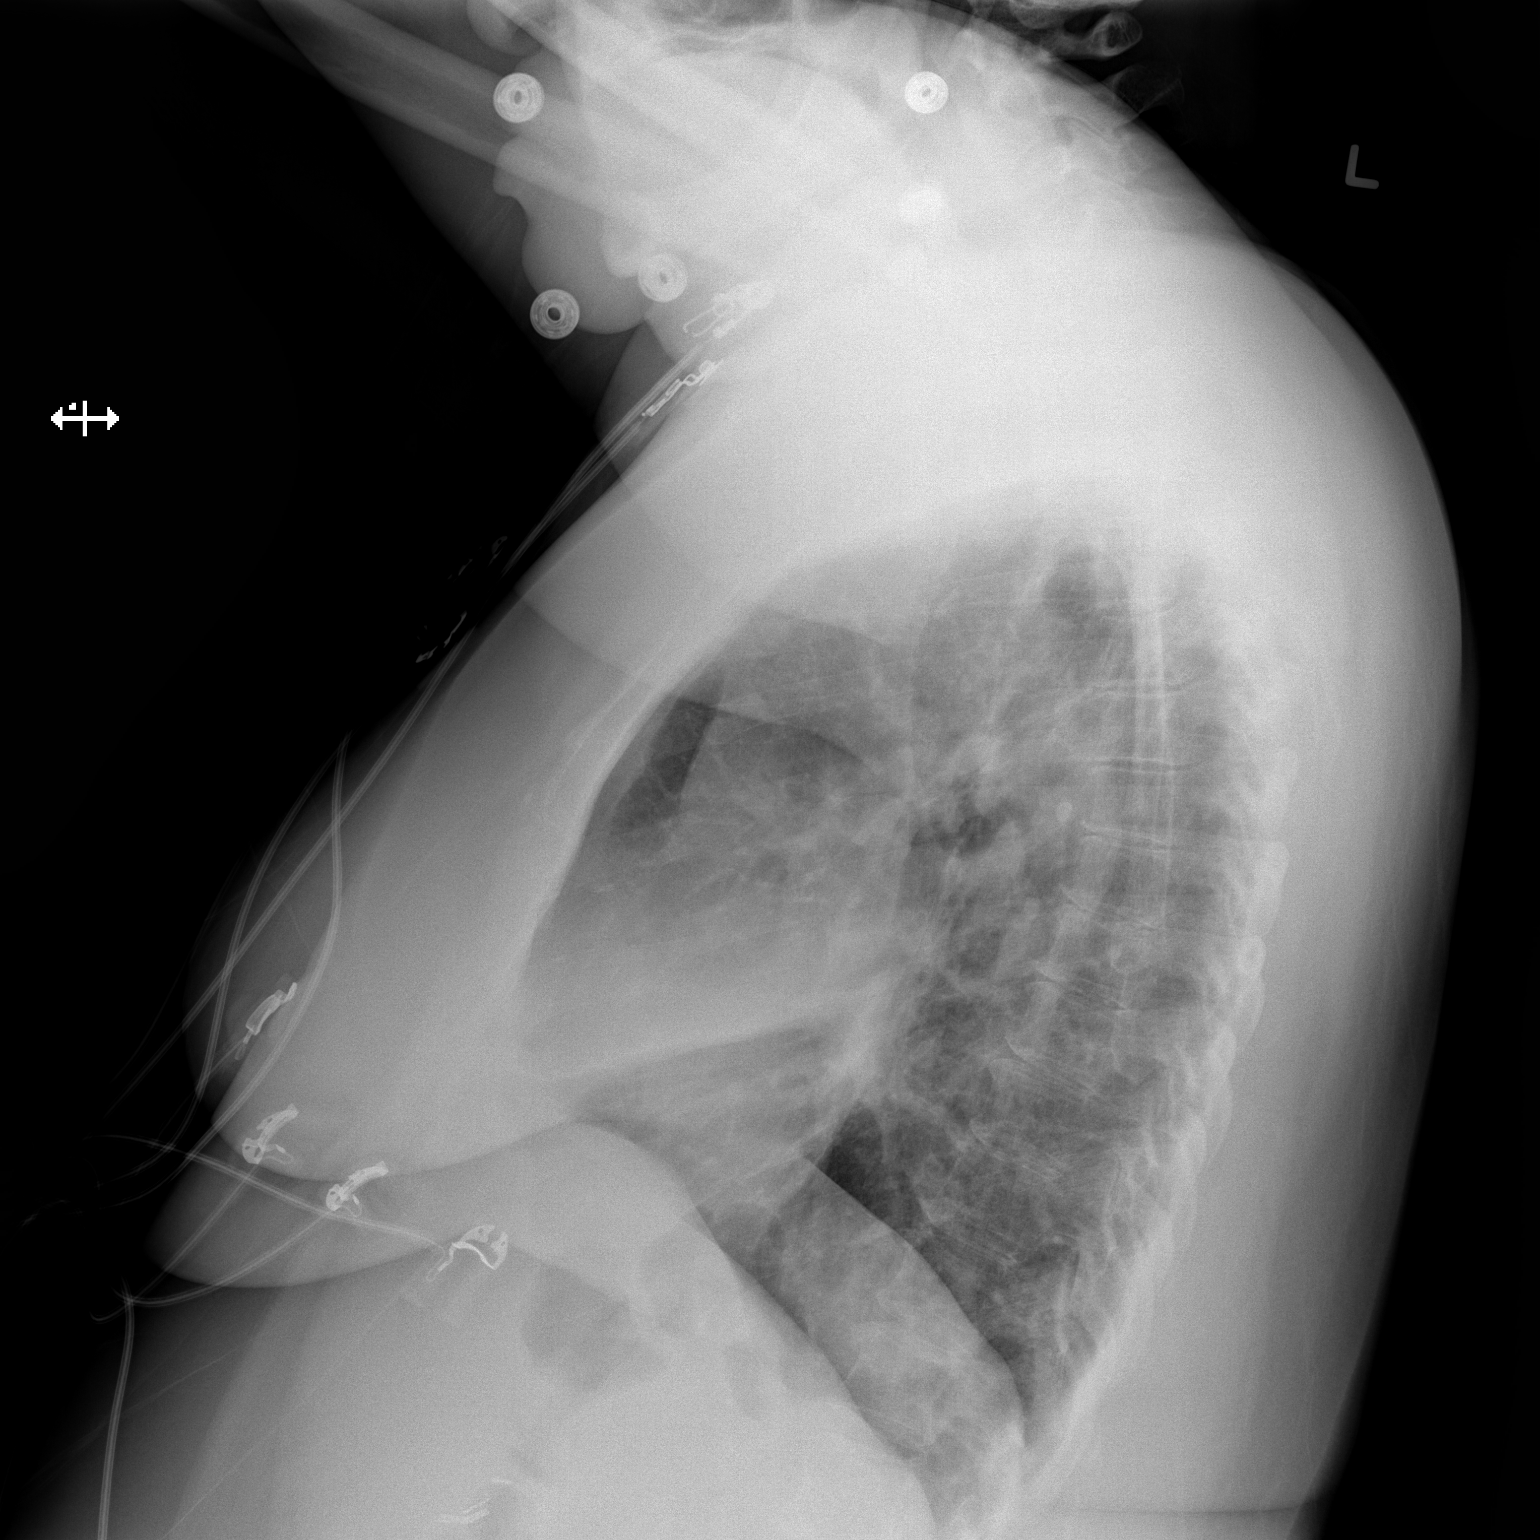

[2 of 2 positions shown; findings below may reference images not displayed]

FINDINGS: There is chronic atelectasis in the RIGHT middle lobe. Lung and on
the formed lung remains a and elongated nodule within the lateral
aspect RIGHT middle lobe is unchanged from remote CT and radiograph.
No effusion, infiltrate pneumothorax. No pulmonary edema. Chronic
bronchitic markings are present. No acute osseous abnormality.
IMPRESSION: 1. Chronic atelectasis and elongated nodule in the RIGHT middle lobe
unchanged from comparison CT or chest radiograph.
2. Chronic bronchitic markings. No clear acute cardiopulmonary
findings

## 2018-09-22 ENCOUNTER — Other Ambulatory Visit: Payer: Self-pay

## 2018-09-22 MED ORDER — AMLODIPINE BESYLATE 5 MG PO TABS: ORAL_TABLET | ORAL | 1 refills | Status: DC

## 2018-09-25 ENCOUNTER — Other Ambulatory Visit: Payer: Self-pay

## 2018-09-25 ENCOUNTER — Other Ambulatory Visit (INDEPENDENT_AMBULATORY_CARE_PROVIDER_SITE_OTHER): Payer: Medicare Other

## 2018-09-25 DIAGNOSIS — E1165 Type 2 diabetes mellitus with hyperglycemia: Secondary | ICD-10-CM

## 2018-09-25 DIAGNOSIS — Z794 Long term (current) use of insulin: Secondary | ICD-10-CM

## 2018-09-25 LAB — MICROALBUMIN / CREATININE URINE RATIO
Creatinine,U: 128.6 mg/dL
Microalb Creat Ratio: 1.2 mg/g (ref 0.0–30.0)
Microalb, Ur: 1.5 mg/dL (ref 0.0–1.9)

## 2018-09-25 LAB — BASIC METABOLIC PANEL
BUN: 23 mg/dL (ref 6–23)
CO2: 29 mEq/L (ref 19–32)
Calcium: 10.2 mg/dL (ref 8.4–10.5)
Chloride: 103 mEq/L (ref 96–112)
Creatinine, Ser: 1.23 mg/dL — ABNORMAL HIGH (ref 0.40–1.20)
GFR: 52.66 mL/min — ABNORMAL LOW (ref >60.00)
Glucose, Bld: 113 mg/dL — ABNORMAL HIGH (ref 70–99)
Potassium: 3.8 mEq/L (ref 3.5–5.1)
Sodium: 143 mEq/L (ref 135–145)

## 2018-09-25 LAB — HEMOGLOBIN A1C: Hgb A1c MFr Bld: 7.7 % — ABNORMAL HIGH (ref 4.6–6.5)

## 2018-09-28 ENCOUNTER — Ambulatory Visit (INDEPENDENT_AMBULATORY_CARE_PROVIDER_SITE_OTHER): Payer: Medicare Other | Admitting: Endocrinology

## 2018-09-28 ENCOUNTER — Encounter: Payer: Self-pay | Admitting: Endocrinology

## 2018-09-28 ENCOUNTER — Other Ambulatory Visit: Payer: Self-pay

## 2018-09-28 VITALS — BP 148/86 | HR 88 | Ht 64.0 in | Wt 192.4 lb

## 2018-09-28 DIAGNOSIS — Z794 Long term (current) use of insulin: Secondary | ICD-10-CM | POA: Diagnosis not present

## 2018-09-28 DIAGNOSIS — E1165 Type 2 diabetes mellitus with hyperglycemia: Secondary | ICD-10-CM | POA: Diagnosis not present

## 2018-09-28 DIAGNOSIS — N289 Disorder of kidney and ureter, unspecified: Secondary | ICD-10-CM | POA: Diagnosis not present

## 2018-09-28 DIAGNOSIS — I10 Essential (primary) hypertension: Secondary | ICD-10-CM | POA: Diagnosis not present

## 2018-09-28 MED ORDER — CANAGLIFLOZIN-METFORMIN HCL ER 50-1000 MG PO TB24: 2.0000 | ORAL_TABLET | Freq: Every day | ORAL | 3 refills | Status: DC

## 2018-09-28 NOTE — Patient Instructions (Addendum)
Checking blood sugars: Please follow the directions below  Check blood sugars on waking up at least every other day  Also check blood sugars DAILY about 2 hours after meals and do this after different meals by rotation  This includes checking AFTER LUNCH and AFTER DINNER  Recommended blood sugar levels on waking up are 90-130 and about 2 hours after meal is 130-160  Please bring your blood sugar monitor to each visit, thank you  INSULIN doses:  GREEN insulin pen: This needs to be taken before every meal, 3 times a day  Before breakfast: Take 10 units Before lunch: Take 30 units Before dinner take 30 units  The gray pen/LANTUS: Continue to take 30 units at dinnertime along with the other insulin  Continue to take 2 tablets of Invokamet with breakfast  BLOOD pressure medications: Increase the amlodipine to 2 tablets together in the morning Reduce benazepril to half a tablet Avoid high salty foods and canned foods

## 2018-09-28 NOTE — Progress Notes (Signed)
Patient ID: Deborah Neal, female   DOB: [birth year], 68 y.o.   MRN: [id number]          Reason for Appointment: Follow-up for Type 2 Diabetes  Referring physician: [name]   History of Present Illness:          Date of diagnosis of type 2 diabetes mellitus:        Background history: She was initially started on metformin but apparently since severely since blood sugar not controlled she was switched to insulin in 2018 She has taken various types of insulin in the past including premixed insulin with usually poor control Does not have a history of taking a GLP-1 drug or medication like Invokana Her blood sugars were under fair control until 6/18 when her  A1c+ went up to 9.2 and subsequently has been higher  Recent history:   Instructions on the last visit: She will stop the Humulin R in the morning Emphasized the need to check readings after lunch or supper Switch Lantus to the evening Once her Lantus is finished we will switch her to [facility] She will increase her suppertime dose regular insulin to 40 units empirically   INSULIN regimen is:  Humulin R U-500, 60 units in the morning and Lantus 30 units in the evening  Non-insulin hypoglycemic drugs the patient is taking are: Invokamet XR 50/1000, 2 tablets daily  Her A1c is higher at 7.7 compared to 7.1   Current management, blood sugar patterns and problems identified:  She is again totally confused about how she is supposed to be taking her insulin and not following instructions given  She was told to stop her regular insulin in the morning and take Lantus instead but she is only taking the regular insulin once in the morning  She did not let us know she was going to run out of Lantus and still taking this  Also for some reason is taking only one third her previous dose of Lantus, was taking 90 units  She demonstrated how she is dialing up her regular insulin in the morning on the Humulin R U-500 pen  And confirmed  that she is taking this only once a day  She did not bring her monitor and not clear if she has a good recall about her blood sugars  Blood sugar late morning on her labs was only 113  Not clear why her A1c is higher  As before she forgets to check her sugars after lunch or dinner despite instructions to  Reportedly FASTING readings are the highest compared to lunchtime but she does not think they are over 150 compared to previous visit  She will occasionally have low sugars at lunch as before  She thinks she is taking her Invokamet regularly, takes 2 tablets daily  She thinks she is trying to walk regularly  Her weight appears to be 6 pounds less than on her last visit        Side effects from medications have been: None   Compliance with the medical regimen: Fair   Glucose monitoring:  done 1 times a day         Glucometer: One Touch Ultra.       Blood Glucose readings by recall   PRE-MEAL Fasting Lunch Dinner Bedtime Overall  Glucose range:  Under 150  50-90     Mean/median:     ?   Previous readings:  PRE-MEAL Fasting Lunch Dinner Bedtime Overall  Glucose range:  99-273  45-209     Mean/median: 169 93    153+/-50     Typical meal intake: Breakfast is oatmeal or toast and boiled egg, lunch  or meat sandwich  Usually has meat and potatoes at dinner at 7 pm Snacks: Popcorn,  or fruit                Dietician visit, most recent: 6/19  Weight history:  Wt Readings from Last 3 Encounters:  09/28/18 192 lb 6.4 oz (87.3 kg)  06/29/18 194 lb (88 kg)  06/27/18 198 lb 9.6 oz (90.1 kg)    Glycemic control:   Lab Results  Component Value Date   HGBA1C 7.7 (H) 09/25/2018   HGBA1C 7.1 (H) 06/23/2018   HGBA1C 7.0 (H) 03/21/2018   Lab Results  Component Value Date   MICROALBUR 1.5 09/25/2018   LDLCALC 43 12/13/2017   CREATININE 1.23 (H) 09/25/2018   Lab Results  Component Value Date   MICRALBCREAT 1.2 09/25/2018    Lab Results  Component Value Date    FRUCTOSAMINE 277 01/09/2018   FRUCTOSAMINE 337 (H) 09/02/2017   Lab on 09/25/2018  Component Date Value Ref Range Status   Microalb, Ur 09/25/2018 1.5  0.0 - 1.9 mg/dL Final   Creatinine,U 09/25/2018 128.6  mg/dL Final   Microalb Creat Ratio 09/25/2018 1.2  0.0 - 30.0 mg/g Final   Sodium 09/25/2018 143  135 - 145 mEq/L Final   Potassium 09/25/2018 3.8  3.5 - 5.1 mEq/L Final   Chloride 09/25/2018 103  96 - 112 mEq/L Final   CO2 09/25/2018 29  19 - 32 mEq/L Final   Glucose, Bld 09/25/2018 113* 70 - 99 mg/dL Final   BUN 09/25/2018 23  6 - 23 mg/dL Final   Creatinine, Ser 09/25/2018 1.23* 0.40 - 1.20 mg/dL Final   Calcium 09/25/2018 10.2  8.4 - 10.5 mg/dL Final   GFR 09/25/2018 52.66* >60.00 mL/min Final   Hgb A1c MFr Bld 09/25/2018 7.7* 4.6 - 6.5 % Final   Glycemic Control Guidelines for People with Diabetes:Non Diabetic:  <6%Goal of Therapy: <7%Additional Action Suggested:  >8%       Allergies as of 09/28/2018   No Known Allergies     Medication List       Accurate as of September 28, 2018 11:10 AM. If you have any questions, ask your nurse or doctor.        STOP taking these medications   predniSONE 10 MG tablet Commonly known as:  DELTASONE Stopped by:  Elayne Snare, MD     TAKE these medications   alendronate 70 MG tablet Commonly known as:  FOSAMAX TAKE 1 TABLET BY MOUTH ONCE EVERY 7 DAYS. TAKE WITH A FULL GLASS OF WATER AND ON AN EMPTY STOMACH   amLODipine 5 MG tablet Commonly known as:  NORVASC TAKE 1 TABLET(5 MG) BY MOUTH DAILY   aspirin 81 MG tablet Take 81 mg by mouth every morning.   azithromycin 250 MG tablet Commonly known as:  Zithromax Z-Pak 2 tab by mouth day 1, then 1 per day   benazepril 20 MG tablet Commonly known as:  LOTENSIN TAKE 1 TABLET(20 MG) BY MOUTH DAILY   cyclobenzaprine 5 MG tablet Commonly known as:  FLEXERIL 1-2 qhs prn   glucose blood test strip Commonly known as:  ONE TOUCH ULTRA TEST Use to check blood sugars three  times a day   HumuLIN R U-500 KwikPen 500 UNIT/ML kwikpen Generic drug:  insulin regular human CONCENTRATED ADMINISTER  60 UNITS UNDER THE SKIN 30 MINUTES BEFORE MEALS   Invokamet 50-1000 MG Tabs Generic drug:  Canagliflozin-metFORMIN HCl TAKE 2 TABLETS BY MOUTH DAILY WITH BREAKFAST   Lancets Misc Use as directed four times daily  E11.9   Lantus SoloStar 100 UNIT/ML Solostar Pen Generic drug:  Insulin Glargine INJECT 90 UNITS INTO THE SKIN EVERY DAY SUBCUTANEOUSLY   multivitamin with minerals Tabs tablet Take 1 tablet by mouth daily.   ONE TOUCH ULTRA 2 w/Device Kit Use as directed   potassium chloride 10 MEQ tablet Commonly known as:  K-DUR TAKE 3 TABLETS(30 MEQ) BY MOUTH DAILY   ProAir HFA 108 (90 Base) MCG/ACT inhaler Generic drug:  albuterol INHALE 2 PUFFS BY MOUTH EVERY 6 HOURS AS NEEDED FOR WHEEZING OR SHORTNESS OF BREATH   rosuvastatin 20 MG tablet Commonly known as:  CRESTOR TAKE 1 TABLET(20 MG) BY MOUTH DAILY       Allergies: No Known Allergies  Past Medical History:  Diagnosis Date   ALLERGIC RHINITIS 01/29/2007   Arthritis    ASTHMA 01/29/2007   ASTHMA, WITH ACUTE EXACERBATION 06/24/2008   BACK PAIN 01/29/2009   CHEST PAIN 06/24/2008   Family history of adverse reaction to anesthesia    Patients daughter has N/V after anesthesia   GERD (gastroesophageal reflux disease)    History of shingles    HYPERLIPIDEMIA 01/29/2007   HYPERTENSION 01/29/2007   OSTEOPENIA 10/16/2007   Type II or unspecified type diabetes mellitus without mention of complication, uncontrolled 11/29/2013    Past Surgical History:  Procedure Laterality Date   CHOLECYSTECTOMY N/A 09/19/2015   Procedure: LAPAROSCOPIC CHOLECYSTECTOMY WITH INTRAOPERATIVE CHOLANGIOGRAM;  Surgeon: Jackolyn Confer, MD;  Location: [place];  Service: General;  Laterality: N/A;   COLONOSCOPY     TONSILLECTOMY  1956   VIDEO BRONCHOSCOPY Bilateral 03/28/2014   Procedure: VIDEO BRONCHOSCOPY WITHOUT  FLUORO;  Surgeon: Tanda Rockers, MD;  Location: [facility];  Service: Cardiopulmonary;  Laterality: Bilateral;    Family History  Problem Relation Age of Onset   Cancer Mother        ? type    Diabetes Father    Hypertension Father    Heart disease Father    Asthma Father    Asthma Daughter    Colon cancer Neg Hx    Esophageal cancer Neg Hx    Rectal cancer Neg Hx    Stomach cancer Neg Hx    Breast cancer Neg Hx     Social History:  reports that she quit smoking about 15 years ago. She has never used smokeless tobacco. She reports that she does not drink alcohol or use drugs.   Review of Systems   Lipid history: On Crestor 20 for management prescribed by  PCP    Lab Results  Component Value Date   CHOL 111 12/13/2017   HDL 40.60 12/13/2017   LDLCALC 43 12/13/2017   LDLDIRECT 153.4 01/29/2009   TRIG 141.0 12/13/2017   CHOLHDL 3 12/13/2017           Hypertension: Treated by PCP She is on 5 mg amlodipine and 20 mg lisinopril She thinks her blood pressure is higher because of not taking her morning medications  Not clear why her creatinine is higher  BP Readings from Last 3 Encounters:  09/28/18 (!) 148/86  06/29/18 132/88  06/27/18 140/72   Lab Results  Component Value Date   CREATININE 1.23 (H) 09/25/2018   CREATININE 0.96 06/23/2018   CREATININE 1.01 03/21/2018  Most recent eye exam was in 04/2018, no report available  Most recent foot exam: 07/2017   LABS:  Lab on 09/25/2018  Component Date Value Ref Range Status   Microalb, Ur 09/25/2018 1.5  0.0 - 1.9 mg/dL Final   Creatinine,U 09/25/2018 128.6  mg/dL Final   Microalb Creat Ratio 09/25/2018 1.2  0.0 - 30.0 mg/g Final   Sodium 09/25/2018 143  135 - 145 mEq/L Final   Potassium 09/25/2018 3.8  3.5 - 5.1 mEq/L Final   Chloride 09/25/2018 103  96 - 112 mEq/L Final   CO2 09/25/2018 29  19 - 32 mEq/L Final   Glucose, Bld 09/25/2018 113* 70 - 99 mg/dL Final   BUN 09/25/2018 23   6 - 23 mg/dL Final   Creatinine, Ser 09/25/2018 1.23* 0.40 - 1.20 mg/dL Final   Calcium 09/25/2018 10.2  8.4 - 10.5 mg/dL Final   GFR 09/25/2018 52.66* >60.00 mL/min Final   Hgb A1c MFr Bld 09/25/2018 7.7* 4.6 - 6.5 % Final   Glycemic Control Guidelines for People with Diabetes:Non Diabetic:  <6%Goal of Therapy: <7%Additional Action Suggested:  >8%     Physical Examination:  BP (!) 148/86 (BP Location: Left Arm, Patient Position: Sitting, Cuff Size: Normal)    Pulse 88    Ht _0  (1.626 m)    Wt 192 lb 6.4 oz (87.3 kg)    SpO2 96%    BMI 33.03 kg/m         ASSESSMENT:  Diabetes type 2, with obesity on insulin  See history of present illness for detailed discussion of current diabetes management, blood sugar patterns and problems identified  Her A1c is higher at 7.7  As discussed above she is not able to follow instructions for her insulin or blood sugar monitoring She appears to be taking her Humulin regular insulin only once a day instead of before each meal Likely has high readings after lunch and dinner but she does not monitor it is  She has lost a little weight Again she tends to have low normal or low sugars at lunch partly from taking large doses of regular insulin in the morning along with not always getting protein at breakfast Still likely benefiting from Invokana   Hypertension: Blood pressure is high in the office today  Renal function: This is slightly worse and not clear if this is from combining Invokamet and benazepril   PLAN:  Detailed instructions were written out for all the measures she has to take for checking her sugar and how to take her insulin as well as blood sugar targets Emphasized the need to take the Humulin R U-500, 30 minutes before the meal to control readings after eating She was given the instructions in writing and explained all the measures she needs to take especially checking her sugars at different times Unable to know whether she  needs an adjustment of her Lantus since she does not report consistently high fasting readings even though she is taking only one third the previous dose She will need short-term follow-up in 6-week She will also need to bring her monitor on each visit  New insulin doses: Humulin R 02-22-29 before meals and Lantus 30 at dinnertime We will consider changing to [place] but we will not try to confuse her with too many changes today  Her mildly increased creatinine she will reduce her Lotensin to half tablet but increase amlodipine to 2 tablets for now Low-salt diet  Patient Instructions  Checking blood sugars:  Please follow the directions below  Check blood sugars on waking up at least every other day  Also check blood sugars DAILY about 2 hours after meals and do this after different meals by rotation  This includes checking AFTER LUNCH and AFTER DINNER  Recommended blood sugar levels on waking up are 90-130 and about 2 hours after meal is 130-160  Please bring your blood sugar monitor to each visit, thank you  INSULIN doses:  GREEN insulin pen: This needs to be taken before every meal, 3 times a day  Before breakfast: Take 10 units Before lunch: Take 30 units Before dinner take 30 units  The gray pen/LANTUS: Continue to take 30 units at dinnertime along with the other insulin  Continue to take 2 tablets of Invokamet with breakfast  BLOOD pressure medications: Increase the amlodipine to 2 tablets together in the morning Reduce benazepril to half a tablet Avoid high salty foods and canned foods    Counseling time on subjects discussed in assessment and plan sections is over 50% of today's 25 minute visit       Elayne Snare 09/28/2018, 11:10 AM   Note: This office note was prepared with Dragon voice recognition system technology. Any transcriptional errors that result from this process are unintentional.

## 2018-10-12 ENCOUNTER — Other Ambulatory Visit: Payer: Self-pay | Admitting: Endocrinology

## 2018-10-17 ENCOUNTER — Other Ambulatory Visit: Payer: Self-pay

## 2018-10-17 MED ORDER — HUMULIN R U-500 KWIKPEN 500 UNIT/ML ~~LOC~~ SOPN: PEN_INJECTOR | SUBCUTANEOUS | 0 refills | Status: DC

## 2018-10-17 MED ORDER — INVOKAMET XR 50-1000 MG PO TB24: 2.0000 | ORAL_TABLET | Freq: Every day | ORAL | 3 refills | Status: DC

## 2018-10-23 ENCOUNTER — Ambulatory Visit (INDEPENDENT_AMBULATORY_CARE_PROVIDER_SITE_OTHER): Payer: Medicare Other | Admitting: Internal Medicine

## 2018-10-23 ENCOUNTER — Encounter: Payer: Self-pay | Admitting: Internal Medicine

## 2018-10-23 ENCOUNTER — Other Ambulatory Visit (INDEPENDENT_AMBULATORY_CARE_PROVIDER_SITE_OTHER): Payer: Medicare Other

## 2018-10-23 ENCOUNTER — Other Ambulatory Visit: Payer: Self-pay

## 2018-10-23 VITALS — BP 122/76 | HR 102 | Temp 98.2°F | Resp 15 | Ht 64.0 in | Wt 192.0 lb

## 2018-10-23 DIAGNOSIS — Z0000 Encounter for general adult medical examination without abnormal findings: Secondary | ICD-10-CM

## 2018-10-23 DIAGNOSIS — E559 Vitamin D deficiency, unspecified: Secondary | ICD-10-CM

## 2018-10-23 DIAGNOSIS — E611 Iron deficiency: Secondary | ICD-10-CM

## 2018-10-23 DIAGNOSIS — E538 Deficiency of other specified B group vitamins: Secondary | ICD-10-CM | POA: Diagnosis not present

## 2018-10-23 DIAGNOSIS — E119 Type 2 diabetes mellitus without complications: Secondary | ICD-10-CM

## 2018-10-23 DIAGNOSIS — R29898 Other symptoms and signs involving the musculoskeletal system: Secondary | ICD-10-CM

## 2018-10-23 DIAGNOSIS — I10 Essential (primary) hypertension: Secondary | ICD-10-CM

## 2018-10-23 DIAGNOSIS — R269 Unspecified abnormalities of gait and mobility: Secondary | ICD-10-CM

## 2018-10-23 DIAGNOSIS — N761 Subacute and chronic vaginitis: Secondary | ICD-10-CM

## 2018-10-23 DIAGNOSIS — M549 Dorsalgia, unspecified: Secondary | ICD-10-CM | POA: Diagnosis not present

## 2018-10-23 LAB — CBC WITH DIFFERENTIAL/PLATELET
Basophils Absolute: 0 10*3/uL (ref 0.0–0.1)
Basophils Relative: 0.5 % (ref 0.0–3.0)
Eosinophils Absolute: 0.1 10*3/uL (ref 0.0–0.7)
Eosinophils Relative: 1.2 % (ref 0.0–5.0)
HCT: 39.6 % (ref 36.0–46.0)
Hemoglobin: 12.3 g/dL (ref 12.0–15.0)
Lymphocytes Relative: 35.3 % (ref 12.0–46.0)
Lymphs Abs: 2.7 10*3/uL (ref 0.7–4.0)
MCHC: 31.1 g/dL (ref 30.0–36.0)
MCV: 75.7 fl — ABNORMAL LOW (ref 78.0–100.0)
Monocytes Absolute: 0.6 10*3/uL (ref 0.1–1.0)
Monocytes Relative: 7.5 % (ref 3.0–12.0)
Neutro Abs: 4.2 10*3/uL (ref 1.4–7.7)
Neutrophils Relative %: 55.5 % (ref 43.0–77.0)
Platelets: 245 10*3/uL (ref 150.0–400.0)
RBC: 5.23 Mil/uL — ABNORMAL HIGH (ref 3.87–5.11)
RDW: 15.3 % (ref 11.5–15.5)
WBC: 7.6 10*3/uL (ref 4.0–10.5)

## 2018-10-23 LAB — HEMOGLOBIN A1C: Hgb A1c MFr Bld: 7.3 % — ABNORMAL HIGH (ref 4.6–6.5)

## 2018-10-23 MED ORDER — FLUCONAZOLE 150 MG PO TABS: ORAL_TABLET | ORAL | 1 refills | Status: DC

## 2018-10-23 NOTE — Progress Notes (Signed)
Subjective:    Patient ID: Deborah Neal, female    DOB: 1950-11-23, 68 y.o.   MRN: [id number]  HPI  Here for wellness and f/u;  Overall doing ok;  Pt denies Chest pain, worsening SOB, DOE, wheezing, orthopnea, PND, worsening LE edema, palpitations, dizziness or syncope.  Pt denies neurological change such as new headache, facial or extremity weakness.  Pt denies polydipsia, polyuria, or low sugar symptoms. Pt states overall good compliance with treatment and medications, good tolerability, and has been trying to follow appropriate diet.  Pt denies worsening depressive symptoms, suicidal ideation or panic. No fever, night sweats, wt loss, loss of appetite, or other constitutional symptoms.  Pt states good ability with ADL's, has low fall risk, home safety reviewed and adequate, no other significant changes in hearing or vision, and only occasionally active with exercise.   Pt continues to have recurring LBP without change in severity, bowel or bladder change, fever, wt loss,  worsening LE pain/numbness though has had some bilat LE weakness at time and gait change (no falls)and both legs go numb on occasion.  Also has some mild intermittent right periscapular sharp pain when moves the shoulder and arm, asks for PT referral  .  Also c/o 1 wk onset typical vaginal yeast like d/c without pain, fever, blood or swelling. Past Medical History:  Diagnosis Date  . ALLERGIC RHINITIS 01/29/2007  . Arthritis   . ASTHMA 01/29/2007  . ASTHMA, WITH ACUTE EXACERBATION 06/24/2008  . BACK PAIN 01/29/2009  . CHEST PAIN 06/24/2008  . Family history of adverse reaction to anesthesia    Patients daughter has N/V after anesthesia  . GERD (gastroesophageal reflux disease)   . History of shingles   . HYPERLIPIDEMIA 01/29/2007  . HYPERTENSION 01/29/2007  . OSTEOPENIA 10/16/2007  . Type II or unspecified type diabetes mellitus without mention of complication, uncontrolled 11/29/2013   Past Surgical History:  Procedure Laterality  Date  . CHOLECYSTECTOMY N/A 09/19/2015   Procedure: LAPAROSCOPIC CHOLECYSTECTOMY WITH INTRAOPERATIVE CHOLANGIOGRAM;  Surgeon: Jackolyn Confer, MD;  Location: [place];  Service: General;  Laterality: N/A;  . COLONOSCOPY    . TONSILLECTOMY  1956  . VIDEO BRONCHOSCOPY Bilateral 03/28/2014   Procedure: VIDEO BRONCHOSCOPY WITHOUT FLUORO;  Surgeon: Tanda Rockers, MD;  Location: [facility];  Service: Cardiopulmonary;  Laterality: Bilateral;    reports that she quit smoking about 15 years ago. She has never used smokeless tobacco. She reports that she does not drink alcohol or use drugs. family history includes Asthma in her daughter and father; Cancer in her mother; Diabetes in her father; Heart disease in her father; Hypertension in her father. No Known Allergies Current Outpatient Medications on File Prior to Visit  Medication Sig Dispense Refill  . alendronate (FOSAMAX) 70 MG tablet TAKE 1 TABLET BY MOUTH ONCE EVERY 7 DAYS. TAKE WITH A FULL GLASS OF WATER AND ON AN EMPTY STOMACH 12 tablet 1  . amLODipine (NORVASC) 5 MG tablet TAKE 1 TABLET(5 MG) BY MOUTH DAILY 90 tablet 1  . aspirin 81 MG tablet Take 81 mg by mouth every morning.     Marland Kitchen azithromycin (ZITHROMAX Z-PAK) 250 MG tablet 2 tab by mouth day 1, then 1 per day 6 tablet 0  . Blood Glucose Monitoring Suppl (ONE TOUCH ULTRA 2) w/Device KIT Use as directed 1 each 0  . Canagliflozin-metFORMIN HCl ER (INVOKAMET XR) 50-1000 MG TB24 Take 2 tablets by mouth daily with breakfast. 180 tablet 3  . cyclobenzaprine (FLEXERIL) 5 MG  tablet 1-2 qhs prn 14 tablet 0  . glucose blood (ONE TOUCH ULTRA TEST) test strip Use to check blood sugars three times a day 300 each 11  . insulin regular human CONCENTRATED (HUMULIN R U-500 KWIKPEN) 500 UNIT/ML kwikpen ADMINISTER 60 UNITS UNDER THE SKIN 30 MINUTES BEFORE MEALS 20 mL 0  . Lancets MISC Use as directed four times daily  E11.9 400 each 11  . LANTUS SOLOSTAR 100 UNIT/ML Solostar Pen INJECT 90 UNITS INTO THE SKIN  EVERY DAY SUBCUTANEOUSLY 15 mL 0  . Multiple Vitamin (MULTIVITAMIN WITH MINERALS) TABS tablet Take 1 tablet by mouth daily.    . potassium chloride (K-DUR) 10 MEQ tablet TAKE 3 TABLETS(30 MEQ) BY MOUTH DAILY 270 tablet 3  . PROAIR HFA 108 (90 Base) MCG/ACT inhaler INHALE 2 PUFFS BY MOUTH EVERY 6 HOURS AS NEEDED FOR WHEEZING OR SHORTNESS OF BREATH 8.5 g 0  . rosuvastatin (CRESTOR) 20 MG tablet TAKE 1 TABLET(20 MG) BY MOUTH DAILY 90 tablet 3   No current facility-administered medications on file prior to visit.    Review of Systems Constitutional: Negative for other unusual diaphoresis, sweats, appetite or weight changes HENT: Negative for other worsening hearing loss, ear pain, facial swelling, mouth sores or neck stiffness.   Eyes: Negative for other worsening pain, redness or other visual disturbance.  Respiratory: Negative for other stridor or swelling Cardiovascular: Negative for other palpitations or other chest pain  Gastrointestinal: Negative for worsening diarrhea or loose stools, blood in stool, distention or other pain Genitourinary: Negative for hematuria, flank pain or other change in urine volume.  Musculoskeletal: Negative for myalgias or other joint swelling.  Skin: Negative for other color change, or other wound or worsening drainage.  Neurological: Negative for other syncope or numbness. Hematological: Negative for other adenopathy or swelling Psychiatric/Behavioral: Negative for hallucinations, other worsening agitation, SI, self-injury, or new decreased concentration All other system neg per pt    Objective:   Physical Exam BP 122/76   Pulse (!) 102   Temp 98.2 F (36.8 C) (Oral)   Resp 15   Ht 5' 4" (1.626 m)   Wt 192 lb (87.1 kg)   SpO2 97%   BMI 32.96 kg/m  VS noted,  Constitutional: Pt is oriented to person, place, and time. Appears well-developed and well-nourished, in no significant distress and comfortable Head: Normocephalic and atraumatic  Eyes:  Conjunctivae and EOM are normal. Pupils are equal, round, and reactive to light Right Ear: External ear normal without discharge Left Ear: External ear normal without discharge Nose: Nose without discharge or deformity Mouth/Throat: Oropharynx is without other ulcerations and moist  Neck: Normal range of motion. Neck supple. No JVD present. No tracheal deviation present or significant neck LA or mass Cardiovascular: Normal rate, regular rhythm, normal heart sounds and intact distal pulses.   Pulmonary/Chest: WOB normal and breath sounds without rales or wheezing  Abdominal: Soft. Bowel sounds are normal. NT. No HSM  Musculoskeletal: Normal range of motion. Exhibits no edema, spine nontender in the midline, right shoulder non tender except for right periscapular area and decreased ROM Lymphadenopathy: Has no other cervical adenopathy.  Neurological: Pt is alert and oriented to person, place, and time. Pt has normal reflexes. No cranial nerve deficit. Motor grossly intact, Gait intact Skin: Skin is warm and dry. No rash noted or new ulcerations Psychiatric:  Has normal mood and affect. Behavior is normal without agitation No other exam findings Lab Results  Component Value Date   WBC 7.6  10/23/2018   HGB 12.3 10/23/2018   HCT 39.6 10/23/2018   PLT 245.0 10/23/2018   GLUCOSE 93 10/23/2018   CHOL 110 10/23/2018   TRIG 92.0 10/23/2018   HDL 42.90 10/23/2018   LDLDIRECT 153.4 01/29/2009   LDLCALC 48 10/23/2018   ALT 25 10/23/2018   AST 21 10/23/2018   NA 143 10/23/2018   K 3.7 10/23/2018   CL 104 10/23/2018   CREATININE 1.17 10/23/2018   BUN 22 10/23/2018   CO2 29 10/23/2018   TSH 4.81 (H) 10/23/2018   HGBA1C 7.3 (H) 10/23/2018   MICROALBUR 1.7 10/23/2018         Assessment & Plan:

## 2018-10-23 NOTE — Patient Instructions (Signed)
You will be contacted regarding the referral for: Physical therapy for the weakness  Please take all new medication as prescribed - the yeast medication  Please continue all other medications as before, and refills have been done if requested.  Please have the pharmacy call with any other refills you may need.  Please continue your efforts at being more active, low cholesterol diet, and weight control.  You are otherwise up to date with prevention measures today.  Please keep your appointments with your specialists as you may have planned  Please go to the LAB in the Basement (turn left off the elevator) for the tests to be done today  You will be contacted by phone if any changes need to be made immediately.  Otherwise, you will receive a letter about your results with an explanation, but please check with MyChart first.  Please remember to sign up for MyChart if you have not done so, as this will be important to you in the future with finding out test results, communicating by private email, and scheduling acute appointments online when needed.  Please return in 6 months, or sooner if needed, with Lab testing done 3-5 days before

## 2018-10-24 ENCOUNTER — Encounter: Payer: Self-pay | Admitting: Internal Medicine

## 2018-10-24 LAB — URINALYSIS, ROUTINE W REFLEX MICROSCOPIC
Bilirubin Urine: NEGATIVE
Hgb urine dipstick: NEGATIVE
Ketones, ur: NEGATIVE
Leukocytes,Ua: NEGATIVE
Nitrite: NEGATIVE
RBC / HPF: NONE SEEN (ref 0–?)
Specific Gravity, Urine: 1.02 (ref 1.000–1.030)
Total Protein, Urine: NEGATIVE
Urine Glucose: >=1000 — AB
Urobilinogen, UA: 0.2 (ref 0.0–1.0)
pH: 5 (ref 5.0–8.0)

## 2018-10-24 LAB — LIPID PANEL
Cholesterol: 110 mg/dL (ref 0–200)
HDL: 42.9 mg/dL (ref >39.00)
LDL Cholesterol: 48 mg/dL (ref 0–99)
NonHDL: 66.63
Total CHOL/HDL Ratio: 3
Triglycerides: 92 mg/dL (ref 0.0–149.0)
VLDL: 18.4 mg/dL (ref 0.0–40.0)

## 2018-10-24 LAB — BASIC METABOLIC PANEL
BUN: 22 mg/dL (ref 6–23)
CO2: 29 mEq/L (ref 19–32)
Calcium: 10 mg/dL (ref 8.4–10.5)
Chloride: 104 mEq/L (ref 96–112)
Creatinine, Ser: 1.17 mg/dL (ref 0.40–1.20)
GFR: 55.77 mL/min — ABNORMAL LOW (ref >60.00)
Glucose, Bld: 93 mg/dL (ref 70–99)
Potassium: 3.7 mEq/L (ref 3.5–5.1)
Sodium: 143 mEq/L (ref 135–145)

## 2018-10-24 LAB — HEPATIC FUNCTION PANEL
ALT: 25 U/L (ref 0–35)
AST: 21 U/L (ref 0–37)
Albumin: 4.6 g/dL (ref 3.5–5.2)
Alkaline Phosphatase: 63 U/L (ref 39–117)
Bilirubin, Direct: 0 mg/dL (ref 0.0–0.3)
Total Bilirubin: 0.2 mg/dL (ref 0.2–1.2)
Total Protein: 7.6 g/dL (ref 6.0–8.3)

## 2018-10-24 LAB — IBC PANEL
Iron: 43 ug/dL (ref 42–145)
Saturation Ratios: 10.7 % — ABNORMAL LOW (ref 20.0–50.0)
Transferrin: 288 mg/dL (ref 212.0–360.0)

## 2018-10-24 LAB — VITAMIN D 25 HYDROXY (VIT D DEFICIENCY, FRACTURES): VITD: 43.67 ng/mL (ref 30.00–100.00)

## 2018-10-24 LAB — VITAMIN B12: Vitamin B-12: 712 pg/mL (ref 211–911)

## 2018-10-24 LAB — TSH: TSH: 4.81 u[IU]/mL — ABNORMAL HIGH (ref 0.35–4.50)

## 2018-10-24 LAB — MICROALBUMIN / CREATININE URINE RATIO
Creatinine,U: 130.3 mg/dL
Microalb Creat Ratio: 1.3 mg/g (ref 0.0–30.0)
Microalb, Ur: 1.7 mg/dL (ref 0.0–1.9)

## 2018-10-27 ENCOUNTER — Encounter: Payer: Self-pay | Admitting: Internal Medicine

## 2018-10-27 DIAGNOSIS — N760 Acute vaginitis: Secondary | ICD-10-CM | POA: Insufficient documentation

## 2018-10-27 NOTE — Assessment & Plan Note (Signed)

## 2018-10-27 NOTE — Assessment & Plan Note (Addendum)
Essentially chronic stable but with right periscapular pain and decreased ROM right shoulder, for PT evaluation, declines sport med referral for now, declines LS spine MRI

## 2018-10-27 NOTE — Assessment & Plan Note (Signed)
stable overall by history and exam, recent data reviewed with pt, and pt to continue medical treatment as before,  to f/u any worsening symptoms or concerns  

## 2018-10-27 NOTE — Assessment & Plan Note (Signed)
Mild, likely typical yeast, for empiric diflucan, consider GYN referral if not improved

## 2018-10-27 NOTE — Assessment & Plan Note (Signed)
stable overall by history and exam, recent data reviewed with pt, and pt to continue medical treatment as before,  to f/u any worsening symptoms or concerns Lab Results  Component Value Date   HGBA1C 7.3 (H) 10/23/2018

## 2018-11-10 ENCOUNTER — Other Ambulatory Visit: Payer: Self-pay

## 2018-11-13 ENCOUNTER — Encounter: Payer: Self-pay | Admitting: Endocrinology

## 2018-11-13 ENCOUNTER — Ambulatory Visit (INDEPENDENT_AMBULATORY_CARE_PROVIDER_SITE_OTHER): Payer: Medicare Other | Admitting: Endocrinology

## 2018-11-13 ENCOUNTER — Other Ambulatory Visit: Payer: Self-pay

## 2018-11-13 VITALS — BP 112/62 | HR 110 | Ht 64.0 in | Wt 191.8 lb

## 2018-11-13 DIAGNOSIS — E1165 Type 2 diabetes mellitus with hyperglycemia: Secondary | ICD-10-CM

## 2018-11-13 DIAGNOSIS — I10 Essential (primary) hypertension: Secondary | ICD-10-CM | POA: Diagnosis not present

## 2018-11-13 DIAGNOSIS — Z794 Long term (current) use of insulin: Secondary | ICD-10-CM | POA: Diagnosis not present

## 2018-11-13 DIAGNOSIS — E039 Hypothyroidism, unspecified: Secondary | ICD-10-CM | POA: Diagnosis not present

## 2018-11-13 DIAGNOSIS — E038 Other specified hypothyroidism: Secondary | ICD-10-CM

## 2018-11-13 NOTE — Progress Notes (Signed)
Patient ID: Deborah Neal, female   DOB: 02/01/1951, 68 y.o.   MRN: [id number]          Reason for Appointment: Follow-up for Type 2 Diabetes  Referring physician: [name]   History of Present Illness:          Date of diagnosis of type 2 diabetes mellitus:        Background history: She was initially started on metformin but apparently since severely since blood sugar not controlled she was switched to insulin in 2018 She has taken various types of insulin in the past including premixed insulin with usually poor control Does not have a history of taking a GLP-1 drug or medication like Invokana Her blood sugars were under fair control until 6/18 when her  A1c+ went up to 9.2 and subsequently has been higher  Recent history:    INSULIN regimen is:  Humulin R U-500, 10 units in the morning, 30 units before lunch and dinner and Lantus 30 units in the evening  Non-insulin hypoglycemic drugs the patient is taking are: Invokamet XR 50/1000, 2 tablets daily  Her A1c is slightly better at 7.3, previously was at 7.7    Current management, blood sugar patterns and problems identified:  She is finally following the instructions for her insulin doses  Also her blood sugars are more consistently controlled although only the last week of readings were able to be reviewed  She confirmed her insulin doses and is able to differentiate the 2 types of insulins  However FASTING blood sugars now appear to be mostly high except for a couple of readings in the 130s  With reducing her Humulin R down to 10 units in the morning she has not had hypoglycemia lately before lunch as before  Has previously been seen by the dietitian last summer but is still not able to lose weight with previously given diet and not clear if she is following this consistently  She is able to walk slowly and she thinks she can walk at least 30 minutes when she does  Postprandial readings at bedtime are still mostly high  but has readings as low as 110 based on her diet  No hypoglycemia reported recently  She thinks she is quite regular with taking her medicines but not clear if she is taking her Humulin R U-500 regularly 30 minutes before meals        Side effects from medications have been: None   Compliance with the medical regimen: Fair   Glucose monitoring:  done 1-2 times a day         Glucometer: One Touch Ultra.       Blood Glucose readings by review of meter   PRE-MEAL Fasting Lunch Dinner Bedtime Overall  Glucose range:  134-241    1 26-197   Mean/median:      149   POST-MEAL PC Breakfast PC Lunch PC Dinner  Glucose range:   132   Mean/median:        PRE-MEAL Fasting Lunch Dinner Bedtime Overall  Glucose range:  Under 150  50-90     Mean/median:     ?   Previous readings:  PRE-MEAL Fasting Lunch Dinner Bedtime Overall  Glucose range:  99-273  45-209     Mean/median: 169 93    153+/-50     Typical meal intake: Breakfast is oatmeal or toast and boiled egg, lunch  or meat sandwich  Usually has meat and potatoes at dinner at  7 pm Snacks: Popcorn,  or fruit                Dietician visit, most recent: 6/19  Weight history:  Wt Readings from Last 3 Encounters:  11/13/18 191 lb 12.8 oz (87 kg)  10/23/18 192 lb (87.1 kg)  09/28/18 192 lb 6.4 oz (87.3 kg)    Glycemic control:   Lab Results  Component Value Date   HGBA1C 7.3 (H) 10/23/2018   HGBA1C 7.7 (H) 09/25/2018   HGBA1C 7.1 (H) 06/23/2018   Lab Results  Component Value Date   MICROALBUR 1.7 10/23/2018   LDLCALC 48 10/23/2018   CREATININE 1.17 10/23/2018   Lab Results  Component Value Date   MICRALBCREAT 1.3 10/23/2018    Lab Results  Component Value Date   FRUCTOSAMINE 277 01/09/2018   FRUCTOSAMINE 337 (H) 09/02/2017   No visits with results within 1 Week(s) from this visit.  Latest known visit with results is:  Appointment on 10/23/2018  Component Date Value Ref Range Status  . VITD 10/23/2018  43.67  30.00 - 100.00 ng/mL Final  . Iron 10/23/2018 43  42 - 145 ug/dL Final  . Transferrin 10/23/2018 288.0  212.0 - 360.0 mg/dL Final  . Saturation Ratios 10/23/2018 10.7* 20.0 - 50.0 % Final  . Vitamin B-12 10/23/2018 712  211 - 911 pg/mL Final  . Color, Urine 10/23/2018 YELLOW  Yellow;Lt. Yellow;Straw;Dark Yellow;Amber;Green;Red;Brown Final  . APPearance 10/23/2018 CLEAR  Clear;Turbid;Slightly Cloudy;Cloudy Final  . Specific Gravity, Urine 10/23/2018 1.020  1.000 - 1.030 Final  . pH 10/23/2018 5.0  5.0 - 8.0 Final  . Total Protein, Urine 10/23/2018 NEGATIVE  Negative Final  . Urine Glucose 10/23/2018 >=1000* Negative Final  . Ketones, ur 10/23/2018 NEGATIVE  Negative Final  . Bilirubin Urine 10/23/2018 NEGATIVE  Negative Final  . Hgb urine dipstick 10/23/2018 NEGATIVE  Negative Final  . Urobilinogen, UA 10/23/2018 0.2  0.0 - 1.0 Final  . Leukocytes,Ua 10/23/2018 NEGATIVE  Negative Final  . Nitrite 10/23/2018 NEGATIVE  Negative Final  . WBC, UA 10/23/2018 0-2/hpf  0-2/hpf Final  . RBC / HPF 10/23/2018 none seen  0-2/hpf Final  . Squamous Epithelial / LPF 10/23/2018 Rare(0-4/hpf)  Rare(0-4/hpf) Final  . TSH 10/23/2018 4.81* 0.35 - 4.50 uIU/mL Final  . WBC 10/23/2018 7.6  4.0 - 10.5 K/uL Final  . RBC 10/23/2018 5.23* 3.87 - 5.11 Mil/uL Final  . Hemoglobin 10/23/2018 12.3  12.0 - 15.0 g/dL Final  . HCT 10/23/2018 39.6  36.0 - 46.0 % Final  . MCV 10/23/2018 75.7* 78.0 - 100.0 fl Final  . MCHC 10/23/2018 31.1  30.0 - 36.0 g/dL Final  . RDW 10/23/2018 15.3  11.5 - 15.5 % Final  . Platelets 10/23/2018 245.0  150.0 - 400.0 K/uL Final  . Neutrophils Relative % 10/23/2018 55.5  43.0 - 77.0 % Final  . Lymphocytes Relative 10/23/2018 35.3  12.0 - 46.0 % Final  . Monocytes Relative 10/23/2018 7.5  3.0 - 12.0 % Final  . Eosinophils Relative 10/23/2018 1.2  0.0 - 5.0 % Final  . Basophils Relative 10/23/2018 0.5  0.0 - 3.0 % Final  . Neutro Abs 10/23/2018 4.2  1.4 - 7.7 K/uL Final  . Lymphs  Abs 10/23/2018 2.7  0.7 - 4.0 K/uL Final  . Monocytes Absolute 10/23/2018 0.6  0.1 - 1.0 K/uL Final  . Eosinophils Absolute 10/23/2018 0.1  0.0 - 0.7 K/uL Final  . Basophils Absolute 10/23/2018 0.0  0.0 - 0.1 K/uL Final  . Total  Bilirubin 10/23/2018 0.2  0.2 - 1.2 mg/dL Final  . Bilirubin, Direct 10/23/2018 0.0  0.0 - 0.3 mg/dL Final  . Alkaline Phosphatase 10/23/2018 63  39 - 117 U/L Final  . AST 10/23/2018 21  0 - 37 U/L Final  . ALT 10/23/2018 25  0 - 35 U/L Final  . Total Protein 10/23/2018 7.6  6.0 - 8.3 g/dL Final  . Albumin 10/23/2018 4.6  3.5 - 5.2 g/dL Final  . Sodium 10/23/2018 143  135 - 145 mEq/L Final  . Potassium 10/23/2018 3.7  3.5 - 5.1 mEq/L Final  . Chloride 10/23/2018 104  96 - 112 mEq/L Final  . CO2 10/23/2018 29  19 - 32 mEq/L Final  . Glucose, Bld 10/23/2018 93  70 - 99 mg/dL Final  . BUN 10/23/2018 22  6 - 23 mg/dL Final  . Creatinine, Ser 10/23/2018 1.17  0.40 - 1.20 mg/dL Final  . Calcium 10/23/2018 10.0  8.4 - 10.5 mg/dL Final  . GFR 10/23/2018 55.77* >60.00 mL/min Final  . Cholesterol 10/23/2018 110  0 - 200 mg/dL Final   ATP III Classification       Desirable:  < 200 mg/dL               Borderline High:  200 - 239 mg/dL          High:  > = 240 mg/dL  . Triglycerides 10/23/2018 92.0  0.0 - 149.0 mg/dL Final   Normal:  <150 mg/dLBorderline High:  150 - 199 mg/dL  . HDL 10/23/2018 42.90  >39.00 mg/dL Final  . VLDL 10/23/2018 18.4  0.0 - 40.0 mg/dL Final  . LDL Cholesterol 10/23/2018 48  0 - 99 mg/dL Final  . Total CHOL/HDL Ratio 10/23/2018 3   Final                  Men          Women1/2 Average Risk     3.4          3.3Average Risk          5.0          4.42X Average Risk          9.6          7.13X Average Risk          15.0          11.0                      . NonHDL 10/23/2018 66.63   Final   NOTE:  Non-HDL goal should be 30 mg/dL higher than patient's LDL goal (i.e. LDL goal of < 70 mg/dL, would have non-HDL goal of < 100 mg/dL)  . Hgb A1c MFr Bld  10/23/2018 7.3* 4.6 - 6.5 % Final   Glycemic Control Guidelines for People with Diabetes:Non Diabetic:  <6%Goal of Therapy: <7%Additional Action Suggested:  >8%   . Microalb, Ur 10/23/2018 1.7  0.0 - 1.9 mg/dL Final  . Creatinine,U 10/23/2018 130.3  mg/dL Final  . Microalb Creat Ratio 10/23/2018 1.3  0.0 - 30.0 mg/g Final      Allergies as of 11/13/2018   No Known Allergies     Medication List       Accurate as of November 13, 2018 10:11 AM. If you have any questions, ask your nurse or doctor.        alendronate 70 MG tablet Commonly known as: FOSAMAX TAKE 1  TABLET BY MOUTH ONCE EVERY 7 DAYS. TAKE WITH A FULL GLASS OF WATER AND ON AN EMPTY STOMACH   amLODipine 5 MG tablet Commonly known as: NORVASC TAKE 1 TABLET(5 MG) BY MOUTH DAILY   aspirin 81 MG tablet Take 81 mg by mouth every morning.   azithromycin 250 MG tablet Commonly known as: Zithromax Z-Pak 2 tab by mouth day 1, then 1 per day   cyclobenzaprine 5 MG tablet Commonly known as: FLEXERIL 1-2 qhs prn   fluconazole 150 MG tablet Commonly known as: DIFLUCAN 1 tab every 3 days as needed   glucose blood test strip Commonly known as: ONE TOUCH ULTRA TEST Use to check blood sugars three times a day   HumuLIN R U-500 KwikPen 500 UNIT/ML kwikpen Generic drug: insulin regular human CONCENTRATED ADMINISTER 60 UNITS UNDER THE SKIN 30 MINUTES BEFORE MEALS   Invokamet XR 50-1000 MG Tb24 Generic drug: Canagliflozin-metFORMIN HCl ER Take 2 tablets by mouth daily with breakfast.   Lancets Misc Use as directed four times daily  E11.9   Lantus SoloStar 100 UNIT/ML Solostar Pen Generic drug: Insulin Glargine INJECT 90 UNITS INTO THE SKIN EVERY DAY SUBCUTANEOUSLY   multivitamin with minerals Tabs tablet Take 1 tablet by mouth daily.   ONE TOUCH ULTRA 2 w/Device Kit Use as directed   potassium chloride 10 MEQ tablet Commonly known as: K-DUR TAKE 3 TABLETS(30 MEQ) BY MOUTH DAILY   ProAir HFA 108 (90 Base) MCG/ACT  inhaler Generic drug: albuterol INHALE 2 PUFFS BY MOUTH EVERY 6 HOURS AS NEEDED FOR WHEEZING OR SHORTNESS OF BREATH   rosuvastatin 20 MG tablet Commonly known as: CRESTOR TAKE 1 TABLET(20 MG) BY MOUTH DAILY       Allergies: No Known Allergies  Past Medical History:  Diagnosis Date  . ALLERGIC RHINITIS 01/29/2007  . Arthritis   . ASTHMA 01/29/2007  . ASTHMA, WITH ACUTE EXACERBATION 06/24/2008  . BACK PAIN 01/29/2009  . CHEST PAIN 06/24/2008  . Family history of adverse reaction to anesthesia    Patients daughter has N/V after anesthesia  . GERD (gastroesophageal reflux disease)   . History of shingles   . HYPERLIPIDEMIA 01/29/2007  . HYPERTENSION 01/29/2007  . OSTEOPENIA 10/16/2007  . Type II or unspecified type diabetes mellitus without mention of complication, uncontrolled 11/29/2013    Past Surgical History:  Procedure Laterality Date  . CHOLECYSTECTOMY N/A 09/19/2015   Procedure: LAPAROSCOPIC CHOLECYSTECTOMY WITH INTRAOPERATIVE CHOLANGIOGRAM;  Surgeon: Jackolyn Confer, MD;  Location: [facility];  Service: General;  Laterality: N/A;  . COLONOSCOPY    . TONSILLECTOMY  1956  . VIDEO BRONCHOSCOPY Bilateral 03/28/2014   Procedure: VIDEO BRONCHOSCOPY WITHOUT FLUORO;  Surgeon: Tanda Rockers, MD;  Location: [facility];  Service: Cardiopulmonary;  Laterality: Bilateral;    Family History  Problem Relation Age of Onset  . Cancer Mother        ? type   . Diabetes Father   . Hypertension Father   . Heart disease Father   . Asthma Father   . Asthma Daughter   . Colon cancer Neg Hx   . Esophageal cancer Neg Hx   . Rectal cancer Neg Hx   . Stomach cancer Neg Hx   . Breast cancer Neg Hx     Social History:  reports that she quit smoking about 15 years ago. She has never used smokeless tobacco. She reports that she does not drink alcohol or use drugs.   Review of Systems   Lipid history: On Crestor 20  for management prescribed by  PCP    Lab Results  Component Value Date   CHOL  110 10/23/2018   HDL 42.90 10/23/2018   LDLCALC 48 10/23/2018   LDLDIRECT 153.4 01/29/2009   TRIG 92.0 10/23/2018   CHOLHDL 3 10/23/2018           Hypertension: Treated by PCP She is on 5 mg amlodipine and previously was on lisinopril also, not clear why this was stopped  She says that sometimes she will feel weak in her legs and has to sit down and may also feel lightheaded Not checking blood pressure at home   BP Readings from Last 3 Encounters:  11/13/18 112/62  10/23/18 122/76  09/28/18 (!) 148/86   Lab Results  Component Value Date   CREATININE 1.17 10/23/2018   CREATININE 1.23 (H) 09/25/2018   CREATININE 0.96 06/23/2018    Most recent eye exam was in 04/2018, no report available  Most recent foot exam: 07/2017  HYPOTHYROIDISM: She has had annual TSH tests Does not complain of any unusual fatigue recently No history of thyroid disease but does have some family history of thyroid problems  Lab Results  Component Value Date   TSH 4.81 (H) 10/23/2018   TSH 2.24 12/13/2017   TSH 3.68 09/28/2016      LABS:  No visits with results within 1 Week(s) from this visit.  Latest known visit with results is:  Appointment on 10/23/2018  Component Date Value Ref Range Status  . VITD 10/23/2018 43.67  30.00 - 100.00 ng/mL Final  . Iron 10/23/2018 43  42 - 145 ug/dL Final  . Transferrin 10/23/2018 288.0  212.0 - 360.0 mg/dL Final  . Saturation Ratios 10/23/2018 10.7* 20.0 - 50.0 % Final  . Vitamin B-12 10/23/2018 712  211 - 911 pg/mL Final  . Color, Urine 10/23/2018 YELLOW  Yellow;Lt. Yellow;Straw;Dark Yellow;Amber;Green;Red;Brown Final  . APPearance 10/23/2018 CLEAR  Clear;Turbid;Slightly Cloudy;Cloudy Final  . Specific Gravity, Urine 10/23/2018 1.020  1.000 - 1.030 Final  . pH 10/23/2018 5.0  5.0 - 8.0 Final  . Total Protein, Urine 10/23/2018 NEGATIVE  Negative Final  . Urine Glucose 10/23/2018 >=1000* Negative Final  . Ketones, ur 10/23/2018 NEGATIVE  Negative Final   . Bilirubin Urine 10/23/2018 NEGATIVE  Negative Final  . Hgb urine dipstick 10/23/2018 NEGATIVE  Negative Final  . Urobilinogen, UA 10/23/2018 0.2  0.0 - 1.0 Final  . Leukocytes,Ua 10/23/2018 NEGATIVE  Negative Final  . Nitrite 10/23/2018 NEGATIVE  Negative Final  . WBC, UA 10/23/2018 0-2/hpf  0-2/hpf Final  . RBC / HPF 10/23/2018 none seen  0-2/hpf Final  . Squamous Epithelial / LPF 10/23/2018 Rare(0-4/hpf)  Rare(0-4/hpf) Final  . TSH 10/23/2018 4.81* 0.35 - 4.50 uIU/mL Final  . WBC 10/23/2018 7.6  4.0 - 10.5 K/uL Final  . RBC 10/23/2018 5.23* 3.87 - 5.11 Mil/uL Final  . Hemoglobin 10/23/2018 12.3  12.0 - 15.0 g/dL Final  . HCT 10/23/2018 39.6  36.0 - 46.0 % Final  . MCV 10/23/2018 75.7* 78.0 - 100.0 fl Final  . MCHC 10/23/2018 31.1  30.0 - 36.0 g/dL Final  . RDW 10/23/2018 15.3  11.5 - 15.5 % Final  . Platelets 10/23/2018 245.0  150.0 - 400.0 K/uL Final  . Neutrophils Relative % 10/23/2018 55.5  43.0 - 77.0 % Final  . Lymphocytes Relative 10/23/2018 35.3  12.0 - 46.0 % Final  . Monocytes Relative 10/23/2018 7.5  3.0 - 12.0 % Final  . Eosinophils Relative 10/23/2018 1.2  0.0 -  5.0 % Final  . Basophils Relative 10/23/2018 0.5  0.0 - 3.0 % Final  . Neutro Abs 10/23/2018 4.2  1.4 - 7.7 K/uL Final  . Lymphs Abs 10/23/2018 2.7  0.7 - 4.0 K/uL Final  . Monocytes Absolute 10/23/2018 0.6  0.1 - 1.0 K/uL Final  . Eosinophils Absolute 10/23/2018 0.1  0.0 - 0.7 K/uL Final  . Basophils Absolute 10/23/2018 0.0  0.0 - 0.1 K/uL Final  . Total Bilirubin 10/23/2018 0.2  0.2 - 1.2 mg/dL Final  . Bilirubin, Direct 10/23/2018 0.0  0.0 - 0.3 mg/dL Final  . Alkaline Phosphatase 10/23/2018 63  39 - 117 U/L Final  . AST 10/23/2018 21  0 - 37 U/L Final  . ALT 10/23/2018 25  0 - 35 U/L Final  . Total Protein 10/23/2018 7.6  6.0 - 8.3 g/dL Final  . Albumin 10/23/2018 4.6  3.5 - 5.2 g/dL Final  . Sodium 10/23/2018 143  135 - 145 mEq/L Final  . Potassium 10/23/2018 3.7  3.5 - 5.1 mEq/L Final  . Chloride  10/23/2018 104  96 - 112 mEq/L Final  . CO2 10/23/2018 29  19 - 32 mEq/L Final  . Glucose, Bld 10/23/2018 93  70 - 99 mg/dL Final  . BUN 10/23/2018 22  6 - 23 mg/dL Final  . Creatinine, Ser 10/23/2018 1.17  0.40 - 1.20 mg/dL Final  . Calcium 10/23/2018 10.0  8.4 - 10.5 mg/dL Final  . GFR 10/23/2018 55.77* >60.00 mL/min Final  . Cholesterol 10/23/2018 110  0 - 200 mg/dL Final   ATP III Classification       Desirable:  < 200 mg/dL               Borderline High:  200 - 239 mg/dL          High:  > = 240 mg/dL  . Triglycerides 10/23/2018 92.0  0.0 - 149.0 mg/dL Final   Normal:  <150 mg/dLBorderline High:  150 - 199 mg/dL  . HDL 10/23/2018 42.90  >39.00 mg/dL Final  . VLDL 10/23/2018 18.4  0.0 - 40.0 mg/dL Final  . LDL Cholesterol 10/23/2018 48  0 - 99 mg/dL Final  . Total CHOL/HDL Ratio 10/23/2018 3   Final                  Men          Women1/2 Average Risk     3.4          3.3Average Risk          5.0          4.42X Average Risk          9.6          7.13X Average Risk          15.0          11.0                      . NonHDL 10/23/2018 66.63   Final   NOTE:  Non-HDL goal should be 30 mg/dL higher than patient's LDL goal (i.e. LDL goal of < 70 mg/dL, would have non-HDL goal of < 100 mg/dL)  . Hgb A1c MFr Bld 10/23/2018 7.3* 4.6 - 6.5 % Final   Glycemic Control Guidelines for People with Diabetes:Non Diabetic:  <6%Goal of Therapy: <7%Additional Action Suggested:  >8%   . Microalb, Ur 10/23/2018 1.7  0.0 - 1.9 mg/dL Final  . Creatinine,U 10/23/2018  130.3  mg/dL Final  . Microalb Creat Ratio 10/23/2018 1.3  0.0 - 30.0 mg/g Final    Physical Examination:  BP 112/62 (BP Location: Left Arm, Patient Position: Sitting, Cuff Size: Normal)   Pulse (!) 110   Ht 5' 4" (1.626 m)   Wt 191 lb 12.8 oz (87 kg)   SpO2 93%   BMI 32.92 kg/m         ASSESSMENT:  Diabetes type 2, with obesity on insulin  See history of present illness for detailed discussion of current diabetes management, blood  sugar patterns and problems identified  Her A1c is relatively better at 7.3 although the previous A1c was done only a month ago  Blood sugars are modestly increased but more consistently in the morning Blood sugar patterns are difficult to analyze as she is only monitoring morning and bedtime when she does The date and time on her meter was reprogrammed today Currently not requiring as much insulin as he used to and may be benefiting from [place] [employer] Today discussed day-to-day management of her insulin, timing of the injections, consistent balanced diet and weight loss   Hypertension: Blood pressure is relatively low in the office today as above she may be  Renal function: This is better and currently not on benazepril Despite taking Invokana she appears to be needing a small potassium supplement  PLAN:  She needs to monitor glucose more regularly 2 to 3 hours after meals instead of just fasting and some bedtime readings  Only insulin change will be for Lantus instead of 30  We will be changing to [place] but will wait till she finishes her current supply of Lantus 2 or 3 pens at home Encourage her to continue walking  HYPERTENSION: Blood pressure is lower than usual and she has occasional symptoms of feeling lightheaded and weak  We will reduce her amlodipine to half a tablet She does need to continue her Invokamet however   Thyroid: She is asymptomatic with a mildly increased TSH which is new No goiter on exam and she looks euthyroid We will recheck her thyroid in the next visit before considering any treatment Explained nature of thyroid disease and that subclinical hypothyroidism may be transient    There are no Patient Instructions on file for this visit.    Total visit time for evaluation and management of multiple problems and counseling =25 minutes       Elayne Snare 11/13/2018, 10:11 AM   Note: This office note was prepared with Dragon voice recognition system  technology. Any transcriptional errors that result from this process are unintentional.

## 2018-11-13 NOTE — Patient Instructions (Addendum)
Gray pen 34 at dinner  Check blood sugars on waking up 3 days a week  Also check blood sugars about 2 hours after meals and do this after different meals by rotation  Recommended blood sugar levels on waking up are 90-130 and about 2 hours after meal is 130-160  Please bring your blood sugar monitor to each visit, thank you  Amlodipine 1/2 pill daily  Call before refilling Lantus

## 2018-11-28 ENCOUNTER — Other Ambulatory Visit: Payer: Self-pay | Admitting: Endocrinology

## 2018-12-19 ENCOUNTER — Encounter: Payer: Medicare Other | Admitting: Internal Medicine

## 2018-12-20 ENCOUNTER — Other Ambulatory Visit: Payer: Self-pay | Admitting: Internal Medicine

## 2019-01-02 ENCOUNTER — Other Ambulatory Visit: Payer: Self-pay

## 2019-01-02 NOTE — Patient Outreach (Signed)
[facility]) Care Management  01/02/2019  Deborah Neal 1950/11/27 [id number]   Medication Adherence call to Deborah Neal HIPPA Compliant Voice message left with a call back number. Deborah Neal is showing past due on Rosuvastatin 20 mg and Benazepril 20 mg under [facility].   [facility] Management Direct Dial [phone]  Fax [phone] [email or web address]

## 2019-01-26 ENCOUNTER — Other Ambulatory Visit: Payer: Self-pay

## 2019-01-26 NOTE — Patient Outreach (Signed)
[facility]) Care Management  01/26/2019  ANALEIGH SLEMP [birth year] [id number]   Medication Adherence call to Mrs. Eastlyn Branco HIPPA Compliant Voice message left with a call back number. Mrs. Morgenthaler is showing past due on Rosuvastatin 20 mg and Benazepril 20 mg under [facility].   [facility] Management Direct Dial [phone]  Fax [phone] [email or web address]

## 2019-02-02 ENCOUNTER — Other Ambulatory Visit: Payer: Self-pay | Admitting: Internal Medicine

## 2019-02-02 DIAGNOSIS — Z1231 Encounter for screening mammogram for malignant neoplasm of breast: Secondary | ICD-10-CM

## 2019-02-12 ENCOUNTER — Other Ambulatory Visit: Payer: Self-pay

## 2019-02-12 ENCOUNTER — Other Ambulatory Visit (INDEPENDENT_AMBULATORY_CARE_PROVIDER_SITE_OTHER): Payer: Medicare Other

## 2019-02-12 DIAGNOSIS — E038 Other specified hypothyroidism: Secondary | ICD-10-CM

## 2019-02-12 DIAGNOSIS — E039 Hypothyroidism, unspecified: Secondary | ICD-10-CM

## 2019-02-12 DIAGNOSIS — Z794 Long term (current) use of insulin: Secondary | ICD-10-CM | POA: Diagnosis not present

## 2019-02-12 DIAGNOSIS — E1165 Type 2 diabetes mellitus with hyperglycemia: Secondary | ICD-10-CM

## 2019-02-12 LAB — BASIC METABOLIC PANEL
BUN: 22 mg/dL (ref 6–23)
CO2: 27 mEq/L (ref 19–32)
Calcium: 10 mg/dL (ref 8.4–10.5)
Chloride: 103 mEq/L (ref 96–112)
Creatinine, Ser: 0.95 mg/dL (ref 0.40–1.20)
GFR: 70.86 mL/min (ref >60.00)
Glucose, Bld: 156 mg/dL — ABNORMAL HIGH (ref 70–99)
Potassium: 3.5 mEq/L (ref 3.5–5.1)
Sodium: 142 mEq/L (ref 135–145)

## 2019-02-12 LAB — TSH: TSH: 3.79 u[IU]/mL (ref 0.35–4.50)

## 2019-02-12 LAB — T4, FREE: Free T4: 0.64 ng/dL (ref 0.60–1.60)

## 2019-02-12 LAB — HEMOGLOBIN A1C: Hgb A1c MFr Bld: 8 % — ABNORMAL HIGH (ref 4.6–6.5)

## 2019-02-13 LAB — FRUCTOSAMINE: Fructosamine: 277 umol/L (ref 0–285)

## 2019-02-14 ENCOUNTER — Other Ambulatory Visit: Payer: Self-pay

## 2019-02-14 ENCOUNTER — Encounter: Payer: Self-pay | Admitting: Internal Medicine

## 2019-02-15 NOTE — Progress Notes (Signed)
Patient ID: Deborah Neal, female   DOB: [birth year], 68 y.o.   MRN: [id number]          Reason for Appointment: Follow-up for Type 2 Diabetes  Referring physician: [name]   History of Present Illness:          Date of diagnosis of type 2 diabetes mellitus:        Background history: She was initially started on metformin but apparently since severely since blood sugar not controlled she was switched to insulin in 2018 She has taken various types of insulin in the past including premixed insulin with usually poor control Does not have a history of taking a GLP-1 drug or medication like Invokana Her blood sugars were under fair control until 6/18 when her  A1c+ went up to 9.2 and subsequently has been higher  Recent history:    INSULIN regimen:  Humulin R U-500, 10 units in the morning, 30 units before lunch and dinner and Lantus 30 units in the evening  Non-insulin hypoglycemic drugs the patient is taking are: Invokamet XR 50/1000, 2 tablets daily  Her A1c is back up to 8%, was 7.3   Current management, blood sugar patterns and problems identified:  She is using an old One Touch meter and not clear what her blood sugars are which has the wrong date and time and could not be downloaded  She thinks she is following the doses for her Humulin and Lantus as directed  However blood sugars have been checked primarily in the mornings and only occasionally midday  She has mostly high readings in the mornings but variable and as low as 125  She thinks that similarly higher in the morning if she is eating a sandwich sometimes at bedtime which is 1 AM  Not clear if her sugars are higher after dinner  Only once she did report a feeling of low sugar around 1 AM but did not document this  Blood sugar was down to 65 at 11 AM about 4 days ago but she thinks she may have had less food that morning  She thinks that she is generally trying to eat low-fat meals  Weight is about the  same Unable to do any significant exercise but is trying to do some walking 3 days a week        Side effects from medications have been: None   Compliance with the medical regimen: Fair   Glucose monitoring:  done 1-2 times a day         Glucometer: One Touch Ultra.       Blood Glucose readings by review of meter   PRE-MEAL Fasting Lunch Dinner Bedtime Overall  Glucose range: 125-201  65-188  60 ?   Mean/median:     ?   Previous readings:  PRE-MEAL Fasting Lunch Dinner Bedtime Overall  Glucose range:  134-241    1 26-197   Mean/median:      149   POST-MEAL PC Breakfast PC Lunch PC Dinner  Glucose range:   132   Mean/median:         Typical meal intake: Breakfast is oatmeal or toast and boiled egg, lunch  or meat sandwich  Usually has meat and potatoes at dinner at 7 pm Snacks: Popcorn,  or fruit                Dietician visit, most recent: 6/19  Weight history:  Wt Readings from Last 3 Encounters:  02/16/19 190  lb 9.6 oz (86.5 kg)  11/13/18 191 lb 12.8 oz (87 kg)  10/23/18 192 lb (87.1 kg)    Glycemic control:   Lab Results  Component Value Date   HGBA1C 8.0 (H) 02/12/2019   HGBA1C 7.3 (H) 10/23/2018   HGBA1C 7.7 (H) 09/25/2018   Lab Results  Component Value Date   MICROALBUR 1.7 10/23/2018   LDLCALC 48 10/23/2018   CREATININE 0.95 02/12/2019   Lab Results  Component Value Date   MICRALBCREAT 1.3 10/23/2018    Lab Results  Component Value Date   FRUCTOSAMINE 277 02/12/2019   FRUCTOSAMINE 277 01/09/2018   FRUCTOSAMINE 337 (H) 09/02/2017   Lab on 02/12/2019  Component Date Value Ref Range Status   Sodium 02/12/2019 142  135 - 145 mEq/L Final   Potassium 02/12/2019 3.5  3.5 - 5.1 mEq/L Final   Chloride 02/12/2019 103  96 - 112 mEq/L Final   CO2 02/12/2019 27  19 - 32 mEq/L Final   Glucose, Bld 02/12/2019 156* 70 - 99 mg/dL Final   BUN 02/12/2019 22  6 - 23 mg/dL Final   Creatinine, Ser 02/12/2019 0.95  0.40 - 1.20 mg/dL Final    Calcium 02/12/2019 10.0  8.4 - 10.5 mg/dL Final   GFR 02/12/2019 70.86  >60.00 mL/min Final   Free T4 02/12/2019 0.64  0.60 - 1.60 ng/dL Final   Comment: Specimens from patients who are undergoing biotin therapy and /or ingesting biotin supplements may contain high levels of biotin.  The higher biotin concentration in these specimens interferes with this Free T4 assay.  Specimens that contain high levels  of biotin may cause false high results for this Free T4 assay.  Please interpret results in light of the total clinical presentation of the patient.     TSH 02/12/2019 3.79  0.35 - 4.50 uIU/mL Final   Hgb A1c MFr Bld 02/12/2019 8.0* 4.6 - 6.5 % Final   Glycemic Control Guidelines for People with Diabetes:Non Diabetic:  <6%Goal of Therapy: <7%Additional Action Suggested:  >8%    Fructosamine 02/12/2019 277  0 - 285 umol/L Final   Comment: Published reference interval for apparently healthy subjects between age 70 and 81 is 70 - 285 umol/L and in a poorly controlled diabetic population is 228 - 563 umol/L with a mean of 396 umol/L.       Allergies as of 02/16/2019   No Known Allergies     Medication List       Accurate as of February 16, 2019  1:03 PM. If you have any questions, ask your nurse or doctor.        STOP taking these medications   alendronate 70 MG tablet Commonly known as: FOSAMAX Stopped by: Elayne Snare, MD     TAKE these medications   amLODipine 5 MG tablet Commonly known as: NORVASC TAKE 1 TABLET(5 MG) BY MOUTH DAILY   aspirin 81 MG tablet Take 81 mg by mouth every morning.   azithromycin 250 MG tablet Commonly known as: Zithromax Z-Pak 2 tab by mouth day 1, then 1 per day   cyclobenzaprine 5 MG tablet Commonly known as: FLEXERIL 1-2 qhs prn   fluconazole 150 MG tablet Commonly known as: DIFLUCAN 1 tab every 3 days as needed   glucose blood test strip Commonly known as: ONE TOUCH ULTRA TEST Use to check blood sugars three times a day    HumuLIN R U-500 KwikPen 500 UNIT/ML kwikpen Generic drug: insulin regular human CONCENTRATED ADMINISTER 60 UNITS UNDER  THE SKIN 30 MINUTES BEFORE MEALS   Invokamet XR 50-1000 MG Tb24 Generic drug: Canagliflozin-metFORMIN HCl ER Take 2 tablets by mouth daily with breakfast.   Lancets Misc Use as directed four times daily  E11.9   Lantus SoloStar 100 UNIT/ML Solostar Pen Generic drug: Insulin Glargine INJECT 90 UNITS INTO THE SKIN EVERY DAY SUBCUTANEOUSLY   multivitamin with minerals Tabs tablet Take 1 tablet by mouth daily.   ONE TOUCH ULTRA 2 w/Device Kit Use as directed   potassium chloride 10 MEQ tablet Commonly known as: KLOR-CON TAKE 3 TABLETS(30 MEQ) BY MOUTH DAILY   ProAir HFA 108 (90 Base) MCG/ACT inhaler Generic drug: albuterol INHALE 2 PUFFS BY MOUTH EVERY 6 HOURS AS NEEDED FOR WHEEZING OR SHORTNESS OF BREATH   rosuvastatin 20 MG tablet Commonly known as: CRESTOR TAKE 1 TABLET(20 MG) BY MOUTH DAILY       Allergies: No Known Allergies  Past Medical History:  Diagnosis Date   ALLERGIC RHINITIS 01/29/2007   Arthritis    ASTHMA 01/29/2007   ASTHMA, WITH ACUTE EXACERBATION 06/24/2008   BACK PAIN 01/29/2009   CHEST PAIN 06/24/2008   Family history of adverse reaction to anesthesia    Patients daughter has N/V after anesthesia   GERD (gastroesophageal reflux disease)    History of shingles    HYPERLIPIDEMIA 01/29/2007   HYPERTENSION 01/29/2007   OSTEOPENIA 10/16/2007   Type II or unspecified type diabetes mellitus without mention of complication, uncontrolled 11/29/2013    Past Surgical History:  Procedure Laterality Date   CHOLECYSTECTOMY N/A 09/19/2015   Procedure: LAPAROSCOPIC CHOLECYSTECTOMY WITH INTRAOPERATIVE CHOLANGIOGRAM;  Surgeon: Jackolyn Confer, MD;  Location: [facility];  Service: General;  Laterality: N/A;   COLONOSCOPY     TONSILLECTOMY  1956   VIDEO BRONCHOSCOPY Bilateral 03/28/2014   Procedure: VIDEO BRONCHOSCOPY WITHOUT FLUORO;   Surgeon: Tanda Rockers, MD;  Location: [facility];  Service: Cardiopulmonary;  Laterality: Bilateral;    Family History  Problem Relation Age of Onset   Cancer Mother        ? type    Diabetes Father    Hypertension Father    Heart disease Father    Asthma Father    Asthma Daughter    Colon cancer Neg Hx    Esophageal cancer Neg Hx    Rectal cancer Neg Hx    Stomach cancer Neg Hx    Breast cancer Neg Hx     Social History:  reports that she quit smoking about 15 years ago. She has never used smokeless tobacco. She reports that she does not drink alcohol or use drugs.   Review of Systems   Lipid history: On Crestor 20 for lipid control, prescribed by  PCP    Lab Results  Component Value Date   CHOL 110 10/23/2018   HDL 42.90 10/23/2018   LDLCALC 48 10/23/2018   LDLDIRECT 153.4 01/29/2009   TRIG 92.0 10/23/2018   CHOLHDL 3 10/23/2018           Hypertension: Treated by PCP She is on 5 mg amlodipine and previously was on lisinopril also, not clear why this was stopped  Not checking blood pressure at home Occasionally will still feel lightheaded, she was told to reduce amlodipine to half tablet but has not done so   BP Readings from Last 3 Encounters:  02/16/19 118/70  11/13/18 112/62  10/23/18 122/76   Lab Results  Component Value Date   CREATININE 0.95 02/12/2019   CREATININE 1.17 10/23/2018  CREATININE 1.23 (H) 09/25/2018    Most recent eye exam was in 04/2018, no report available  Most recent foot exam: 07/2017  HYPOTHYROIDISM: She has had annual TSH tests Does not complain of any unusual fatigue but her TSH was mildly increased in June  This is now not as high, has not been on any supplements previously  Lab Results  Component Value Date   TSH 3.79 02/12/2019   TSH 4.81 (H) 10/23/2018   TSH 2.24 12/13/2017   FREET4 0.64 02/12/2019      LABS:  Lab on 02/12/2019  Component Date Value Ref Range Status   Sodium 02/12/2019 142   135 - 145 mEq/L Final   Potassium 02/12/2019 3.5  3.5 - 5.1 mEq/L Final   Chloride 02/12/2019 103  96 - 112 mEq/L Final   CO2 02/12/2019 27  19 - 32 mEq/L Final   Glucose, Bld 02/12/2019 156* 70 - 99 mg/dL Final   BUN 02/12/2019 22  6 - 23 mg/dL Final   Creatinine, Ser 02/12/2019 0.95  0.40 - 1.20 mg/dL Final   Calcium 02/12/2019 10.0  8.4 - 10.5 mg/dL Final   GFR 02/12/2019 70.86  >60.00 mL/min Final   Free T4 02/12/2019 0.64  0.60 - 1.60 ng/dL Final   Comment: Specimens from patients who are undergoing biotin therapy and /or ingesting biotin supplements may contain high levels of biotin.  The higher biotin concentration in these specimens interferes with this Free T4 assay.  Specimens that contain high levels  of biotin may cause false high results for this Free T4 assay.  Please interpret results in light of the total clinical presentation of the patient.     TSH 02/12/2019 3.79  0.35 - 4.50 uIU/mL Final   Hgb A1c MFr Bld 02/12/2019 8.0* 4.6 - 6.5 % Final   Glycemic Control Guidelines for People with Diabetes:Non Diabetic:  <6%Goal of Therapy: <7%Additional Action Suggested:  >8%    Fructosamine 02/12/2019 277  0 - 285 umol/L Final   Comment: Published reference interval for apparently healthy subjects between age 16 and 28 is 55 - 285 umol/L and in a poorly controlled diabetic population is 228 - 563 umol/L with a mean of 396 umol/L.     Physical Examination:  BP 118/70 (BP Location: Left Arm, Patient Position: Sitting, Cuff Size: Normal)    Pulse (!) 108    Ht _0  (1.626 m)    Wt 190 lb 9.6 oz (86.5 kg)    SpO2 97%    BMI 32.72 kg/m         ASSESSMENT:  Diabetes type 2, with obesity on insulin  See history of present illness for detailed discussion of current diabetes management, blood sugar patterns and problems identified  Her A1c is back up to 8%  She is on Invokamet XR, Lantus and Humulin R U-500  Blood sugars are difficult to assess as her meter could  not be downloaded Also checking mostly fasting and occasionally lunchtime blood sugar Blood sugar do fluctuate Some of her high sugars in the mornings are related to late-night snacks without insulin coverage   Hypertension: Blood pressure is again low normal and she has occasional lightheadedness On amlodipine  PLAN:  She was given a One Touch Verio monitor Discussed importance of checking blood sugars by rotation at different times of day especially after lunch and dinner Currently since fasting readings are high at times she will take 10 units of Humulin R if she is eating a sandwich  late at night Advised her to increase walking as tolerated No change in Invokamet XR or Lantus  HYPERTENSION: Blood pressure is low normal with occasional symptoms Again advised her to cut her amlodipine to half tablet  Subclinical hypothyroidism: TSH is back to normal and will continue to monitor, currently asymptomatic  Low normal potassium: Continue supplements  Total visit time for evaluation and management of multiple problems and counseling =25 minutes  She agreed to take the influenza vaccine after explaining the benefits  Patient Instructions  Check blood sugars on waking up 5 days a week  Also check blood sugars about 2 hours after meals and do this after different meals by rotation  Recommended blood sugar levels on waking up are 90-130 and about 2 hours after meal is 130-160  Please bring your blood sugar monitor to each visit, thank you  10 U of the R insulin at nite before snack   AMLODIPINE 1/2 DAILY     Total visit time for evaluation and management of multiple problems and counseling =25 minutes       Elayne Snare 02/16/2019, 1:03 PM   Note: This office note was prepared with Dragon voice recognition system technology. Any transcriptional errors that result from this process are unintentional.

## 2019-02-16 ENCOUNTER — Encounter: Payer: Self-pay | Admitting: Endocrinology

## 2019-02-16 ENCOUNTER — Other Ambulatory Visit: Payer: Self-pay

## 2019-02-16 ENCOUNTER — Ambulatory Visit (INDEPENDENT_AMBULATORY_CARE_PROVIDER_SITE_OTHER): Payer: Medicare Other | Admitting: Endocrinology

## 2019-02-16 VITALS — BP 118/70 | HR 108 | Ht 64.0 in | Wt 190.6 lb

## 2019-02-16 DIAGNOSIS — E1165 Type 2 diabetes mellitus with hyperglycemia: Secondary | ICD-10-CM | POA: Diagnosis not present

## 2019-02-16 DIAGNOSIS — E039 Hypothyroidism, unspecified: Secondary | ICD-10-CM | POA: Diagnosis not present

## 2019-02-16 DIAGNOSIS — Z794 Long term (current) use of insulin: Secondary | ICD-10-CM | POA: Diagnosis not present

## 2019-02-16 DIAGNOSIS — Z23 Encounter for immunization: Secondary | ICD-10-CM | POA: Diagnosis not present

## 2019-02-16 DIAGNOSIS — E038 Other specified hypothyroidism: Secondary | ICD-10-CM

## 2019-02-16 DIAGNOSIS — I10 Essential (primary) hypertension: Secondary | ICD-10-CM | POA: Diagnosis not present

## 2019-02-16 NOTE — Patient Instructions (Addendum)
Check blood sugars on waking up 5 days a week  Also check blood sugars about 2 hours after meals and do this after different meals by rotation  Recommended blood sugar levels on waking up are 90-130 and about 2 hours after meal is 130-160  Please bring your blood sugar monitor to each visit, thank you  10 U of the R insulin at nite before snack   AMLODIPINE 1/2 DAILY

## 2019-03-26 ENCOUNTER — Other Ambulatory Visit: Payer: Self-pay

## 2019-03-26 ENCOUNTER — Ambulatory Visit
Admission: RE | Admit: 2019-03-26 | Discharge: 2019-03-26 | Disposition: A | Discharge status: Home / Self Care | Payer: Medicare Other | Source: Ambulatory Visit | Attending: Internal Medicine | Admitting: Internal Medicine

## 2019-03-26 DIAGNOSIS — Z1231 Encounter for screening mammogram for malignant neoplasm of breast: Secondary | ICD-10-CM

## 2019-04-06 ENCOUNTER — Encounter: Payer: Medicare Other | Admitting: Internal Medicine

## 2019-04-06 ENCOUNTER — Other Ambulatory Visit: Payer: Self-pay

## 2019-05-17 ENCOUNTER — Other Ambulatory Visit (INDEPENDENT_AMBULATORY_CARE_PROVIDER_SITE_OTHER): Payer: BC Managed Care – PPO

## 2019-05-17 ENCOUNTER — Other Ambulatory Visit: Payer: Self-pay

## 2019-05-17 DIAGNOSIS — E1165 Type 2 diabetes mellitus with hyperglycemia: Secondary | ICD-10-CM | POA: Diagnosis not present

## 2019-05-17 DIAGNOSIS — Z794 Long term (current) use of insulin: Secondary | ICD-10-CM | POA: Diagnosis not present

## 2019-05-17 LAB — COMPREHENSIVE METABOLIC PANEL
ALT: 20 U/L (ref 0–35)
AST: 19 U/L (ref 0–37)
Albumin: 4.2 g/dL (ref 3.5–5.2)
Alkaline Phosphatase: 62 U/L (ref 39–117)
BUN: 17 mg/dL (ref 6–23)
CO2: 29 mEq/L (ref 19–32)
Calcium: 9.6 mg/dL (ref 8.4–10.5)
Chloride: 107 mEq/L (ref 96–112)
Creatinine, Ser: 1.21 mg/dL — ABNORMAL HIGH (ref 0.40–1.20)
GFR: 53.56 mL/min — ABNORMAL LOW (ref >60.00)
Glucose, Bld: 99 mg/dL (ref 70–99)
Potassium: 3.9 mEq/L (ref 3.5–5.1)
Sodium: 142 mEq/L (ref 135–145)
Total Bilirubin: 0.3 mg/dL (ref 0.2–1.2)
Total Protein: 7.3 g/dL (ref 6.0–8.3)

## 2019-05-17 LAB — HEMOGLOBIN A1C: Hgb A1c MFr Bld: 7.7 % — ABNORMAL HIGH (ref 4.6–6.5)

## 2019-05-22 ENCOUNTER — Other Ambulatory Visit: Payer: Self-pay

## 2019-05-22 ENCOUNTER — Ambulatory Visit (INDEPENDENT_AMBULATORY_CARE_PROVIDER_SITE_OTHER): Payer: BC Managed Care – PPO | Admitting: Endocrinology

## 2019-05-22 ENCOUNTER — Encounter: Payer: Self-pay | Admitting: Endocrinology

## 2019-05-22 VITALS — BP 136/72 | HR 101 | Ht 64.0 in | Wt 179.6 lb

## 2019-05-22 DIAGNOSIS — I10 Essential (primary) hypertension: Secondary | ICD-10-CM

## 2019-05-22 DIAGNOSIS — E876 Hypokalemia: Secondary | ICD-10-CM | POA: Diagnosis not present

## 2019-05-22 DIAGNOSIS — E1165 Type 2 diabetes mellitus with hyperglycemia: Secondary | ICD-10-CM

## 2019-05-22 DIAGNOSIS — Z794 Long term (current) use of insulin: Secondary | ICD-10-CM

## 2019-05-22 MED ORDER — GLUCOSE BLOOD VI STRP: ORAL_STRIP | 2 refills | Status: DC

## 2019-05-22 NOTE — Progress Notes (Signed)
Patient ID: Deborah Neal, female   DOB: 28-Dec-1950, 69 y.o.   MRN: [id number]          Reason for Appointment: Follow-up for Type 2 Diabetes  Referring physician: [name]   History of Present Illness:          Date of diagnosis of type 2 diabetes mellitus:        Background history: She was initially started on metformin but apparently since severely since blood sugar not controlled she was switched to insulin in 2018 She has taken various types of insulin in the past including premixed insulin with usually poor control Does not have a history of taking a GLP-1 drug or medication like Invokana Her blood sugars were under fair control until 6/18 when her  A1c+ went up to 9.2 and subsequently has been higher  Recent history:    INSULIN regimen:  Humulin R U-500, 10 units in the morning, 30 units before lunch and dinner  Lantus 30 units in the evening  Non-insulin hypoglycemic drugs the patient is taking are: Invokamet XR 50/1000, 2 tablets daily  Her A1c is somewhat better at 7.7 compared to  8%,   Current management, blood sugar patterns and problems identified:  She is still using an old One Touch meter which could not be downloaded and likely has the wrong date programmed  Despite giving her a new meter she did not ask for test trips for this  Checking blood sugars mostly fasting and difficult to know what her postprandial blood sugars are  She thinks that her diet is inconsistent and she is periodically eating sweets at night like chocolates causing her blood sugars to be as high as 313 in the morning  However lowest blood sugar fasting is 116  Lab glucose fasting late morning was 99  She has not had any hypoglycemia but not clear if her sugars are consistently controlled after meals  She did not adjust her mealtime dose based on what she is eating or premeal blood sugar  Has one reading of 189 about 3 hours after eating in the evening  However she thinks she has  cut back on her portions generally and is losing weight  She has been trying to do some walking more than before        Side effects from medications have been: None   Compliance with the medical regimen: Fair   Glucose monitoring:  done 1-2 times a day         Glucometer: One Touch Ultra.       Blood Glucose readings by review of meter   PRE-MEAL Fasting Lunch Dinner Bedtime Overall  Glucose range: 116-313  146     Mean/median:        POST-MEAL PC Breakfast PC Lunch PC Dinner  Glucose range:   189  Mean/median:      Previous readings:  PRE-MEAL Fasting Lunch Dinner Bedtime Overall  Glucose range: 125-201  65-188  60 ?   Mean/median:     ?     Typical meal intake: Breakfast is oatmeal or toast and boiled egg, lunch  or meat sandwich  Usually has meat and potatoes at dinner at 7 pm Snacks: Popcorn,  or fruit                Dietician visit, most recent: 6/19  Weight history:  Wt Readings from Last 3 Encounters:  05/22/19 179 lb 9.6 oz (81.5 kg)  02/16/19 190 lb 9.6  oz (86.5 kg)  11/13/18 191 lb 12.8 oz (87 kg)    Glycemic control:   Lab Results  Component Value Date   HGBA1C 7.7 (H) 05/17/2019   HGBA1C 8.0 (H) 02/12/2019   HGBA1C 7.3 (H) 10/23/2018   Lab Results  Component Value Date   MICROALBUR 1.7 10/23/2018   LDLCALC 48 10/23/2018   CREATININE 1.21 (H) 05/17/2019   Lab Results  Component Value Date   MICRALBCREAT 1.3 10/23/2018    Lab Results  Component Value Date   FRUCTOSAMINE 277 02/12/2019   FRUCTOSAMINE 277 01/09/2018   FRUCTOSAMINE 337 (H) 09/02/2017   Lab on 05/17/2019  Component Date Value Ref Range Status  . Sodium 05/17/2019 142  135 - 145 mEq/L Final  . Potassium 05/17/2019 3.9  3.5 - 5.1 mEq/L Final  . Chloride 05/17/2019 107  96 - 112 mEq/L Final  . CO2 05/17/2019 29  19 - 32 mEq/L Final  . Glucose, Bld 05/17/2019 99  70 - 99 mg/dL Final  . BUN 05/17/2019 17  6 - 23 mg/dL Final  . Creatinine, Ser 05/17/2019 1.21* 0.40 -  1.20 mg/dL Final  . Total Bilirubin 05/17/2019 0.3  0.2 - 1.2 mg/dL Final  . Alkaline Phosphatase 05/17/2019 62  39 - 117 U/L Final  . AST 05/17/2019 19  0 - 37 U/L Final  . ALT 05/17/2019 20  0 - 35 U/L Final  . Total Protein 05/17/2019 7.3  6.0 - 8.3 g/dL Final  . Albumin 05/17/2019 4.2  3.5 - 5.2 g/dL Final  . GFR 05/17/2019 53.56* >60.00 mL/min Final  . Calcium 05/17/2019 9.6  8.4 - 10.5 mg/dL Final  . Hgb A1c MFr Bld 05/17/2019 7.7* 4.6 - 6.5 % Final   Glycemic Control Guidelines for People with Diabetes:Non Diabetic:  <6%Goal of Therapy: <7%Additional Action Suggested:  >8%       Allergies as of 05/22/2019   No Known Allergies     Medication List       Accurate as of May 22, 2019 11:12 AM. If you have any questions, ask your nurse or doctor.        STOP taking these medications   amLODipine 5 MG tablet Commonly known as: NORVASC Stopped by: Elayne Snare, MD     TAKE these medications   aspirin 81 MG tablet Take 81 mg by mouth every morning.   azithromycin 250 MG tablet Commonly known as: Zithromax Z-Pak 2 tab by mouth day 1, then 1 per day   cyclobenzaprine 5 MG tablet Commonly known as: FLEXERIL 1-2 qhs prn   fluconazole 150 MG tablet Commonly known as: DIFLUCAN 1 tab every 3 days as needed   glucose blood test strip Commonly known as: ONE TOUCH ULTRA TEST Use to check blood sugars three times a day   HumuLIN R U-500 KwikPen 500 UNIT/ML kwikpen Generic drug: insulin regular human CONCENTRATED ADMINISTER 60 UNITS UNDER THE SKIN 30 MINUTES BEFORE MEALS   Invokamet XR 50-1000 MG Tb24 Generic drug: Canagliflozin-metFORMIN HCl ER Take 2 tablets by mouth daily with breakfast.   Lancets Misc Use as directed four times daily  E11.9   Lantus SoloStar 100 UNIT/ML Solostar Pen Generic drug: Insulin Glargine INJECT 90 UNITS INTO THE SKIN EVERY DAY SUBCUTANEOUSLY   multivitamin with minerals Tabs tablet Take 1 tablet by mouth daily.   ONE TOUCH ULTRA 2  w/Device Kit Use as directed   potassium chloride 10 MEQ tablet Commonly known as: KLOR-CON TAKE 3 TABLETS(30 MEQ) BY MOUTH DAILY  ProAir HFA 108 (90 Base) MCG/ACT inhaler Generic drug: albuterol INHALE 2 PUFFS BY MOUTH EVERY 6 HOURS AS NEEDED FOR WHEEZING OR SHORTNESS OF BREATH   rosuvastatin 20 MG tablet Commonly known as: CRESTOR TAKE 1 TABLET(20 MG) BY MOUTH DAILY       Allergies: No Known Allergies  Past Medical History:  Diagnosis Date  . ALLERGIC RHINITIS 01/29/2007  . Arthritis   . ASTHMA 01/29/2007  . ASTHMA, WITH ACUTE EXACERBATION 06/24/2008  . BACK PAIN 01/29/2009  . CHEST PAIN 06/24/2008  . Family history of adverse reaction to anesthesia    Patients daughter has N/V after anesthesia  . GERD (gastroesophageal reflux disease)   . History of shingles   . HYPERLIPIDEMIA 01/29/2007  . HYPERTENSION 01/29/2007  . OSTEOPENIA 10/16/2007  . Type II or unspecified type diabetes mellitus without mention of complication, uncontrolled 11/29/2013    Past Surgical History:  Procedure Laterality Date  . CHOLECYSTECTOMY N/A 09/19/2015   Procedure: LAPAROSCOPIC CHOLECYSTECTOMY WITH INTRAOPERATIVE CHOLANGIOGRAM;  Surgeon: Jackolyn Confer, MD;  Location: [facility];  Service: General;  Laterality: N/A;  . COLONOSCOPY    . TONSILLECTOMY  1956  . VIDEO BRONCHOSCOPY Bilateral 03/28/2014   Procedure: VIDEO BRONCHOSCOPY WITHOUT FLUORO;  Surgeon: Tanda Rockers, MD;  Location: [facility];  Service: Cardiopulmonary;  Laterality: Bilateral;    Family History  Problem Relation Age of Onset  . Cancer Mother        ? type   . Diabetes Father   . Hypertension Father   . Heart disease Father   . Asthma Father   . Asthma Daughter   . Colon cancer Neg Hx   . Esophageal cancer Neg Hx   . Rectal cancer Neg Hx   . Stomach cancer Neg Hx   . Breast cancer Neg Hx     Social History:  reports that she quit smoking about 15 years ago. She has never used smokeless tobacco. She reports that she does  not drink alcohol or use drugs.   Review of Systems   Lipid history: On Crestor 20 for lipid control, prescribed by  PCP    Lab Results  Component Value Date   CHOL 110 10/23/2018   HDL 42.90 10/23/2018   LDLCALC 48 10/23/2018   LDLDIRECT 153.4 01/29/2009   TRIG 92.0 10/23/2018   CHOLHDL 3 10/23/2018           Hypertension: Previously treated with lisinopril and amlodipine She was on 5 mg amlodipine and she was told to take 1/2 tablet but she stopped it altogether She says that with stopping this she does not feel lightheaded and overall feels better Has not checked blood pressure at home  Not clear why her creatinine is slightly higher, she says she does drink plenty of water  BP Readings from Last 3 Encounters:  05/22/19 136/72  02/16/19 118/70  11/13/18 112/62   Lab Results  Component Value Date   CREATININE 1.21 (H) 05/17/2019   CREATININE 0.95 02/12/2019   CREATININE 1.17 10/23/2018    Most recent eye exam was in 04/2018, no report available  Most recent foot exam: 07/2017  Abnormal TSH: She has had annual TSH tests for screening from her PCP Had transient increase in TSH in 6/20 only   Lab Results  Component Value Date   TSH 3.79 02/12/2019   TSH 4.81 (H) 10/23/2018   TSH 2.24 12/13/2017   FREET4 0.64 02/12/2019      LABS:  Lab on 05/17/2019  Component Date  Value Ref Range Status  . Sodium 05/17/2019 142  135 - 145 mEq/L Final  . Potassium 05/17/2019 3.9  3.5 - 5.1 mEq/L Final  . Chloride 05/17/2019 107  96 - 112 mEq/L Final  . CO2 05/17/2019 29  19 - 32 mEq/L Final  . Glucose, Bld 05/17/2019 99  70 - 99 mg/dL Final  . BUN 05/17/2019 17  6 - 23 mg/dL Final  . Creatinine, Ser 05/17/2019 1.21* 0.40 - 1.20 mg/dL Final  . Total Bilirubin 05/17/2019 0.3  0.2 - 1.2 mg/dL Final  . Alkaline Phosphatase 05/17/2019 62  39 - 117 U/L Final  . AST 05/17/2019 19  0 - 37 U/L Final  . ALT 05/17/2019 20  0 - 35 U/L Final  . Total Protein 05/17/2019 7.3  6.0 -  8.3 g/dL Final  . Albumin 05/17/2019 4.2  3.5 - 5.2 g/dL Final  . GFR 05/17/2019 53.56* >60.00 mL/min Final  . Calcium 05/17/2019 9.6  8.4 - 10.5 mg/dL Final  . Hgb A1c MFr Bld 05/17/2019 7.7* 4.6 - 6.5 % Final   Glycemic Control Guidelines for People with Diabetes:Non Diabetic:  <6%Goal of Therapy: <7%Additional Action Suggested:  >8%     Physical Examination:  BP 136/72 (BP Location: Left Arm, Patient Position: Sitting, Cuff Size: Normal)   Pulse (!) 101   Ht '5\' 4"'  (1.626 m)   Wt 179 lb 9.6 oz (81.5 kg)   SpO2 94%   BMI 30.83 kg/m         ASSESSMENT:  Diabetes type 2, with obesity on insulin  See history of present illness for detailed discussion of current diabetes management, blood sugar patterns and problems identified  Her A1c is slightly better at 7.7, previously was up to 8%  She is on Invokamet XR, Lantus and Humulin R U-500 3 times a day  As above she has lost weight and has cut back on portions but her A1c is only slightly better This is likely to be from her still being inconsistent with more choices and eating more sweets with blood sugars as high as 313 in the morning Still has no consistent pattern Not able to identify postprandial readings except once or twice Likely needs a little more insulin to cover her evening meal   Hypertension: Blood pressure is controlled even without any medications, likely benefiting from weight loss and taking Invokamet  History of hypokalemia: Her potassium is still low normal even with taking 10 mEq supplement daily  Mild increase in creatinine: Etiology unclear and will need to continue following  PLAN:  She was given a prescription for her One Touch Verio test strips She was given written instructions on when to check her sugars and not just check them in the morning She does need to be consistent with avoiding sweets and at least cutting back on portions of sweets snacks like chocolates  Also may benefit from adjusting  her suppertime dose upward on 5 units based on her meal size and carbohydrate intake, likely needs at least 35 if she is eating sweets Increase suppertime dose to 35 units on a regular basis unless eating a small meal Continue increasing walking as much as tolerated She will call if she is having any tendency to hypoglycemia  HYPERTENSION: No need for any treatments  Recheck renal function on the next visit  Low normal potassium: Continue supplement even though she is not on any diuretic      Patient Instructions  Check blood sugars on waking  up days a week  Also check blood sugars about 2 hours after meals and do this after different meals by rotation  Recommended blood sugar levels on waking up are 90-130 and about 2 hours after meal is 130-160  Please bring your blood sugar monitor to each visit, thank you         Total visit time for evaluation and management of multiple problems and counseling =25 minutes       Elayne Snare 05/22/2019, 11:12 AM   Note: This office note was prepared with Dragon voice recognition system technology. Any transcriptional errors that result from this process are unintentional.

## 2019-05-22 NOTE — Patient Instructions (Addendum)
Check blood sugars on waking up 4-5  days a week  Also check blood sugars about 2 hours after meals and do this after different meals by rotation  Recommended blood sugar levels on waking up are 90-130 and about 2 hours after meal is 130-160  Please bring your blood sugar monitor to each visit, thank you  Take 35 U before supper , no other change  You may register to get the vaccine at either of the 2 options:  [facility] website   [email or web address]  Telephone number: [phone], Option 2   Also can register at the [facility] health website, wait listing may be an option  [email or web address] or call [phone]

## 2019-05-25 ENCOUNTER — Other Ambulatory Visit: Payer: Self-pay

## 2019-05-25 ENCOUNTER — Telehealth: Payer: Self-pay

## 2019-05-25 MED ORDER — SYNJARDY XR 5-1000 MG PO TB24: 2.0000 | ORAL_TABLET | Freq: Every day | ORAL | 2 refills | Status: DC

## 2019-05-25 NOTE — Telephone Encounter (Signed)
Synjardy XR 08/998, 2 tablets daily

## 2019-05-25 NOTE — Telephone Encounter (Signed)
Rx sent and attempted to call pt, but she did not answer. Left voicemail for pt requesting a call back.

## 2019-05-25 NOTE — Telephone Encounter (Signed)
PA is required for Invokamet. Covered alternatives are SYNJARDY, SYNJARDY XR, XIGDUO XR. Would you like to do PA or change medication.?

## 2019-06-18 ENCOUNTER — Other Ambulatory Visit: Payer: Self-pay | Admitting: Internal Medicine

## 2019-06-18 NOTE — Telephone Encounter (Signed)
Please refill as per office routine med refill policy (all routine meds refilled for 3 mo or monthly per pt preference up to one year from last visit, then month to month grace period for 3 mo, then further med refills will have to be denied)  

## 2019-07-04 ENCOUNTER — Encounter: Payer: Self-pay | Admitting: Internal Medicine

## 2019-07-04 ENCOUNTER — Ambulatory Visit (INDEPENDENT_AMBULATORY_CARE_PROVIDER_SITE_OTHER): Payer: BC Managed Care – PPO | Admitting: Internal Medicine

## 2019-07-04 ENCOUNTER — Other Ambulatory Visit: Payer: Self-pay

## 2019-07-04 VITALS — BP 160/84 | HR 74 | Temp 98.1°F | Ht 64.0 in | Wt 184.0 lb

## 2019-07-04 DIAGNOSIS — I10 Essential (primary) hypertension: Secondary | ICD-10-CM

## 2019-07-04 DIAGNOSIS — J4521 Mild intermittent asthma with (acute) exacerbation: Secondary | ICD-10-CM | POA: Diagnosis not present

## 2019-07-04 DIAGNOSIS — Z23 Encounter for immunization: Secondary | ICD-10-CM

## 2019-07-04 DIAGNOSIS — E119 Type 2 diabetes mellitus without complications: Secondary | ICD-10-CM

## 2019-07-04 MED ORDER — METHYLPREDNISOLONE ACETATE 80 MG/ML IJ SUSP
80.0000 mg | Freq: Once | INTRAMUSCULAR | Status: AC
  Administered 2019-07-04: 17:00:00 80 mg via INTRAMUSCULAR

## 2019-07-04 MED ORDER — LOSARTAN POTASSIUM 50 MG PO TABS: 50.0000 mg | ORAL_TABLET | Freq: Every day | ORAL | 3 refills | Status: DC

## 2019-07-04 NOTE — Progress Notes (Signed)
Subjective:    Patient ID: Deborah Neal, female    DOB: May 16, 1950, 69 y.o.   MRN: [id number]  HPI  Here to f/u; overall doing ok,  Pt denies chest pain, orthopnea, PND, increased LE swelling, palpitations, dizziness or syncope, but does have 1 wk worsening sob/doe and wheezing.  Pt denies new neurological symptoms such as new headache, or facial or extremity weakness or numbness.  Pt denies polydipsia, polyuria, or low sugar episode.  Pt states overall good compliance with meds, mostly trying to follow appropriate diet, with wt overall stable,  but little exercise however.  Pt denies fever, wt loss, night sweats, loss of appetite, or other constitutional symptoms .pcjx Current Outpatient Medications on File Prior to Visit  Medication Sig Dispense Refill  . aspirin 81 MG tablet Take 81 mg by mouth every morning.     . Blood Glucose Monitoring Suppl (ONE TOUCH ULTRA 2) w/Device KIT Use as directed 1 each 0  . cyclobenzaprine (FLEXERIL) 5 MG tablet 1-2 qhs prn 14 tablet 0  . fluconazole (DIFLUCAN) 150 MG tablet 1 tab every 3 days as needed 2 tablet 1  . glucose blood (ONE TOUCH ULTRA TEST) test strip Use to check blood sugars three times a day 300 each 11  . glucose blood test strip Use Onetouch verio test strips as instructed to check blood sugar three times daily. 100 each 2  . HUMULIN R U-500 KWIKPEN 500 UNIT/ML kwikpen ADMINISTER 60 UNITS UNDER THE SKIN 30 MINUTES BEFORE MEALS 6 mL 3  . Lancets MISC Use as directed four times daily  E11.9 400 each 11  . LANTUS SOLOSTAR 100 UNIT/ML Solostar Pen INJECT 90 UNITS INTO THE SKIN EVERY DAY SUBCUTANEOUSLY 15 mL 0  . Multiple Vitamin (MULTIVITAMIN WITH MINERALS) TABS tablet Take 1 tablet by mouth daily.    . potassium chloride (K-DUR) 10 MEQ tablet TAKE 3 TABLETS(30 MEQ) BY MOUTH DAILY 270 tablet 3  . PROAIR HFA 108 (90 Base) MCG/ACT inhaler INHALE 2 PUFFS BY MOUTH EVERY 6 HOURS AS NEEDED FOR WHEEZING OR SHORTNESS OF BREATH 8.5 g 0  . rosuvastatin  (CRESTOR) 20 MG tablet TAKE 1 TABLET(20 MG) BY MOUTH DAILY 90 tablet 3  . Empagliflozin-metFORMIN HCl ER (SYNJARDY XR) 08-998 MG TB24 Take 2 tablets by mouth daily. (Patient not taking: Reported on 07/04/2019) 60 tablet 2   No current facility-administered medications on file prior to visit.   Review of Systems All otherwise neg per pt     Objective:   Physical Exam BP (!) 160/84   Pulse 74   Temp 98.1 F (36.7 C)   Ht '5\' 4"'  (1.626 m)   Wt 184 lb (83.5 kg)   SpO2 99%   BMI 31.58 kg/m  VS noted,  Constitutional: Pt appears in NAD HENT: Head: NCAT.  Right Ear: External ear normal.  Left Ear: External ear normal.  Eyes: . Pupils are equal, round, and reactive to light. Conjunctivae and EOM are normal Nose: without d/c or deformity Neck: Neck supple. Gross normal ROM Cardiovascular: Normal rate and regular rhythm.   Pulmonary/Chest: Effort normal and breath sounds without rales but with 1= bilat wheezing.  Abd:  Soft, NT, ND, + BS, no organomegaly Neurological: Pt is alert. At baseline orientation, motor grossly intact Skin: Skin is warm. No rashes, other new lesions, no LE edema Psychiatric: Pt behavior is normal without agitation  All otherwise neg per pt Lab Results  Component Value Date   WBC 7.6 10/23/2018  HGB 12.3 10/23/2018   HCT 39.6 10/23/2018   PLT 245.0 10/23/2018   GLUCOSE 99 05/17/2019   CHOL 110 10/23/2018   TRIG 92.0 10/23/2018   HDL 42.90 10/23/2018   LDLDIRECT 153.4 01/29/2009   LDLCALC 48 10/23/2018   ALT 20 05/17/2019   AST 19 05/17/2019   NA 142 05/17/2019   K 3.9 05/17/2019   CL 107 05/17/2019   CREATININE 1.21 (H) 05/17/2019   BUN 17 05/17/2019   CO2 29 05/17/2019   TSH 3.79 02/12/2019   HGBA1C 7.7 (H) 05/17/2019   MICROALBUR 1.7 10/23/2018      Assessment & Plan:

## 2019-07-04 NOTE — Patient Instructions (Addendum)
You had the Tdap tetanus shot today  You had the steroid shot today  Ok to stay off the benazepril as you have stopped  Please take all new medication as prescribed - the losartan 50 mg per day  Please continue all other medications as before, and refills have been done if requested.  Please have the pharmacy call with any other refills you may need.  Please continue your efforts at being more active, low cholesterol diet, and weight control.  Please keep your appointments with your specialists as you may have planned  Please make an Appointment to return in 6 months, or sooner if needed

## 2019-07-08 ENCOUNTER — Encounter: Payer: Self-pay | Admitting: Internal Medicine

## 2019-07-08 NOTE — Assessment & Plan Note (Addendum)
Mild to mod, for depomedrol IM 80,decines add advair,   to f/u any worsening symptoms or concerns  I spent 32 minutes in preparing to see the patient by review of recent labs, imaging and procedures, obtaining and reviewing separately obtained history, communicating with the patient and family or caregiver, ordering medications, tests or procedures, and documenting clinical information in the EHR including the differential Dx, treatment, and any further evaluation and other management of asthma exaacerbation, DM, HTN

## 2019-07-08 NOTE — Assessment & Plan Note (Signed)
Uncontrolled, for losartan 50 qd

## 2019-07-08 NOTE — Assessment & Plan Note (Signed)
stable overall by history and exam, recent data reviewed with pt, and pt to continue medical treatment as before,  to f/u any worsening symptoms or concerns  

## 2019-07-18 ENCOUNTER — Telehealth: Payer: Self-pay

## 2019-07-18 NOTE — Telephone Encounter (Signed)
Not able to release this information without patient consent.

## 2019-07-18 NOTE — Telephone Encounter (Signed)
New message   [facility] calling   1. Due to historical mediation history with the patient please clarify which medication the patient is currently taken.   2. A1C today was 9.7   Can leave a detailed message on voice mail

## 2019-08-18 DIAGNOSIS — I10 Essential (primary) hypertension: Secondary | ICD-10-CM | POA: Diagnosis not present

## 2019-08-18 DIAGNOSIS — R0789 Other chest pain: Secondary | ICD-10-CM | POA: Diagnosis not present

## 2019-08-18 DIAGNOSIS — R079 Chest pain, unspecified: Secondary | ICD-10-CM | POA: Diagnosis not present

## 2019-08-18 DIAGNOSIS — Z743 Need for continuous supervision: Secondary | ICD-10-CM | POA: Diagnosis not present

## 2019-08-19 DIAGNOSIS — S20213A Contusion of bilateral front wall of thorax, initial encounter: Secondary | ICD-10-CM | POA: Diagnosis not present

## 2019-08-19 DIAGNOSIS — E785 Hyperlipidemia, unspecified: Secondary | ICD-10-CM | POA: Diagnosis not present

## 2019-08-19 DIAGNOSIS — S39012A Strain of muscle, fascia and tendon of lower back, initial encounter: Secondary | ICD-10-CM | POA: Diagnosis not present

## 2019-08-19 DIAGNOSIS — S20219A Contusion of unspecified front wall of thorax, initial encounter: Secondary | ICD-10-CM | POA: Diagnosis not present

## 2019-08-19 DIAGNOSIS — S299XXA Unspecified injury of thorax, initial encounter: Secondary | ICD-10-CM | POA: Diagnosis not present

## 2019-08-19 DIAGNOSIS — J449 Chronic obstructive pulmonary disease, unspecified: Secondary | ICD-10-CM | POA: Diagnosis not present

## 2019-08-19 DIAGNOSIS — I10 Essential (primary) hypertension: Secondary | ICD-10-CM | POA: Diagnosis not present

## 2019-08-19 DIAGNOSIS — R918 Other nonspecific abnormal finding of lung field: Secondary | ICD-10-CM | POA: Diagnosis not present

## 2019-08-20 ENCOUNTER — Other Ambulatory Visit (INDEPENDENT_AMBULATORY_CARE_PROVIDER_SITE_OTHER): Payer: BC Managed Care – PPO

## 2019-08-20 ENCOUNTER — Other Ambulatory Visit: Payer: Self-pay

## 2019-08-20 DIAGNOSIS — E1165 Type 2 diabetes mellitus with hyperglycemia: Secondary | ICD-10-CM

## 2019-08-20 DIAGNOSIS — Z794 Long term (current) use of insulin: Secondary | ICD-10-CM

## 2019-08-20 LAB — URINALYSIS, ROUTINE W REFLEX MICROSCOPIC
Bilirubin Urine: NEGATIVE
Hgb urine dipstick: NEGATIVE
Leukocytes,Ua: NEGATIVE
Nitrite: NEGATIVE
Specific Gravity, Urine: >=1.03 — AB (ref 1.000–1.030)
Total Protein, Urine: 30 — AB
Urine Glucose: >=1000 — AB
Urobilinogen, UA: 1 (ref 0.0–1.0)
pH: 5 (ref 5.0–8.0)

## 2019-08-20 LAB — COMPREHENSIVE METABOLIC PANEL
ALT: 23 U/L (ref 0–35)
AST: 20 U/L (ref 0–37)
Albumin: 4.3 g/dL (ref 3.5–5.2)
Alkaline Phosphatase: 82 U/L (ref 39–117)
BUN: 19 mg/dL (ref 6–23)
CO2: 29 mEq/L (ref 19–32)
Calcium: 9.4 mg/dL (ref 8.4–10.5)
Chloride: 100 mEq/L (ref 96–112)
Creatinine, Ser: 1.29 mg/dL — ABNORMAL HIGH (ref 0.40–1.20)
GFR: 49.71 mL/min — ABNORMAL LOW (ref >60.00)
Glucose, Bld: 347 mg/dL — ABNORMAL HIGH (ref 70–99)
Potassium: 4 mEq/L (ref 3.5–5.1)
Sodium: 137 mEq/L (ref 135–145)
Total Bilirubin: 0.3 mg/dL (ref 0.2–1.2)
Total Protein: 7.3 g/dL (ref 6.0–8.3)

## 2019-08-20 LAB — MICROALBUMIN / CREATININE URINE RATIO
Creatinine,U: 466.8 mg/dL
Microalb Creat Ratio: 2.1 mg/g (ref 0.0–30.0)
Microalb, Ur: 10 mg/dL — ABNORMAL HIGH (ref 0.0–1.9)

## 2019-08-20 LAB — HEMOGLOBIN A1C: Hgb A1c MFr Bld: 10.3 % — ABNORMAL HIGH (ref 4.6–6.5)

## 2019-08-23 ENCOUNTER — Encounter: Payer: Self-pay | Admitting: Endocrinology

## 2019-08-23 ENCOUNTER — Ambulatory Visit (INDEPENDENT_AMBULATORY_CARE_PROVIDER_SITE_OTHER): Payer: BC Managed Care – PPO | Admitting: Endocrinology

## 2019-08-23 ENCOUNTER — Other Ambulatory Visit: Payer: Self-pay

## 2019-08-23 VITALS — BP 150/84 | HR 85 | Ht 64.0 in | Wt 182.8 lb

## 2019-08-23 DIAGNOSIS — Z794 Long term (current) use of insulin: Secondary | ICD-10-CM

## 2019-08-23 DIAGNOSIS — E782 Mixed hyperlipidemia: Secondary | ICD-10-CM

## 2019-08-23 DIAGNOSIS — I10 Essential (primary) hypertension: Secondary | ICD-10-CM | POA: Diagnosis not present

## 2019-08-23 DIAGNOSIS — E1165 Type 2 diabetes mellitus with hyperglycemia: Secondary | ICD-10-CM

## 2019-08-23 MED ORDER — SYNJARDY XR 5-1000 MG PO TB24: 2.0000 | ORAL_TABLET | Freq: Every day | ORAL | 2 refills | Status: DC

## 2019-08-23 NOTE — Progress Notes (Signed)
Patient ID: Deborah Neal, female   DOB: 1950/07/24, 69 y.o.   MRN: [id number]          Reason for Appointment: Follow-up for Type 2 Diabetes  Referring physician: [name]   History of Present Illness:          Date of diagnosis of type 2 diabetes mellitus:        Background history: She was initially started on metformin but apparently since severely since blood sugar not controlled she was switched to insulin in 2018 She has taken various types of insulin in the past including premixed insulin with usually poor control Does not have a history of taking a GLP-1 drug or medication like Invokana Her blood sugars were under fair control until 6/18 when her  A1c+ went up to 9.2 and subsequently has been higher  Recent history:    INSULIN regimen:  Humulin R U-500, 30 units in the morning, 30 units before lunch and dinner  Lantus 90 units in the evening  Non-insulin hypoglycemic drugs: None was on Invokamet XR 50/1000, 2 tablets daily  Her A1c is much higher at 10.3 compared to 7.7   Current management, blood sugar patterns and problems identified:  Blood sugars are much higher.  Her insurance did not cover a glucometer and she was supposed to start Synjardy  However she thinks the pharmacy did not fill that prescription and she did not call to let us know  Again she checks blood sugars mostly in the mornings and midday  Blood sugars were much higher yesterday and she is not sure if she took her Lantus  Previously taking only 30 of Lantus and she has gone up to 90 on her own  She does not complain of any increased thirst or fatigue  She takes her Humulin after eating and this is despite reminding her to take it 30 minutes before eating  Frequently eating cereal in the morning  Despite her high blood sugars she has gained a couple pounds  This is also despite her trying to exercise at the gym now        Side effects from medications have been: None   Compliance with  the medical regimen: Fair   Glucose monitoring:  done 1-2 times a day         Glucometer: One Touch Ultra.       Blood Glucose readings by review of meter and median   PRE-MEAL Fasting Lunch Dinner Bedtime Overall  Glucose range:  233-533  219-445     Mean/median:  300  404    259   Previous readings:   PRE-MEAL Fasting Lunch Dinner Bedtime Overall  Glucose range: 116-313  146     Mean/median:        POST-MEAL PC Breakfast PC Lunch PC Dinner  Glucose range:   189  Mean/median:         Typical meal intake: Breakfast is cereal, oatmeal or toast and boiled egg, lunch  or meat sandwich  Usually has meat and potatoes at dinner at 7 pm Snacks: Popcorn,  or fruit                Dietician visit, most recent: 6/19  Weight history:  Wt Readings from Last 3 Encounters:  08/23/19 182 lb 12.8 oz (82.9 kg)  07/04/19 184 lb (83.5 kg)  05/22/19 179 lb 9.6 oz (81.5 kg)    Glycemic control:   Lab Results  Component Value Date  HGBA1C 10.3 (H) 08/20/2019   HGBA1C 7.7 (H) 05/17/2019   HGBA1C 8.0 (H) 02/12/2019   Lab Results  Component Value Date   MICROALBUR 10.0 (H) 08/20/2019   LDLCALC 48 10/23/2018   CREATININE 1.29 (H) 08/20/2019   Lab Results  Component Value Date   MICRALBCREAT 2.1 08/20/2019    Lab Results  Component Value Date   FRUCTOSAMINE 277 02/12/2019   FRUCTOSAMINE 277 01/09/2018   FRUCTOSAMINE 337 (H) 09/02/2017   Lab on 08/20/2019  Component Date Value Ref Range Status  . Color, Urine 08/20/2019 YELLOW  Yellow;Lt. Yellow;Straw;Dark Yellow;Amber;Green;Red;Brown Final  . APPearance 08/20/2019 CLEAR  Clear;Turbid;Slightly Cloudy;Cloudy Final  . Specific Gravity, Urine 08/20/2019 >=1.030* 1.000 - 1.030 Final  . pH 08/20/2019 5.0  5.0 - 8.0 Final  . Total Protein, Urine 08/20/2019 30* Negative Final  . Urine Glucose 08/20/2019 >=1000* Negative Final  . Ketones, ur 08/20/2019 TRACE* Negative Final  . Bilirubin Urine 08/20/2019 NEGATIVE  Negative Final   . Hgb urine dipstick 08/20/2019 NEGATIVE  Negative Final  . Urobilinogen, UA 08/20/2019 1.0  0.0 - 1.0 Final  . Leukocytes,Ua 08/20/2019 NEGATIVE  Negative Final  . Nitrite 08/20/2019 NEGATIVE  Negative Final  . WBC, UA 08/20/2019 3-6/hpf* 0-2/hpf Final  . RBC / HPF 08/20/2019 0-2/hpf  0-2/hpf Final  . Squamous Epithelial / LPF 08/20/2019 Few(5-10/hpf)* Rare(0-4/hpf) Final  . Bacteria, UA 08/20/2019 Few(10-50/hpf)* None Final  . Microalb, Ur 08/20/2019 10.0* 0.0 - 1.9 mg/dL Final  . Creatinine,U 08/20/2019 466.8  mg/dL Final  . Microalb Creat Ratio 08/20/2019 2.1  0.0 - 30.0 mg/g Final  . Sodium 08/20/2019 137  135 - 145 mEq/L Final  . Potassium 08/20/2019 4.0  3.5 - 5.1 mEq/L Final  . Chloride 08/20/2019 100  96 - 112 mEq/L Final  . CO2 08/20/2019 29  19 - 32 mEq/L Final  . Glucose, Bld 08/20/2019 347* 70 - 99 mg/dL Final  . BUN 08/20/2019 19  6 - 23 mg/dL Final  . Creatinine, Ser 08/20/2019 1.29* 0.40 - 1.20 mg/dL Final  . Total Bilirubin 08/20/2019 0.3  0.2 - 1.2 mg/dL Final  . Alkaline Phosphatase 08/20/2019 82  39 - 117 U/L Final  . AST 08/20/2019 20  0 - 37 U/L Final  . ALT 08/20/2019 23  0 - 35 U/L Final  . Total Protein 08/20/2019 7.3  6.0 - 8.3 g/dL Final  . Albumin 08/20/2019 4.3  3.5 - 5.2 g/dL Final  . GFR 08/20/2019 49.71* >60.00 mL/min Final  . Calcium 08/20/2019 9.4  8.4 - 10.5 mg/dL Final  . Hgb A1c MFr Bld 08/20/2019 10.3* 4.6 - 6.5 % Final   Glycemic Control Guidelines for People with Diabetes:Non Diabetic:  <6%Goal of Therapy: <7%Additional Action Suggested:  >8%       Allergies as of 08/23/2019   No Known Allergies     Medication List       Accurate as of August 23, 2019 10:39 AM. If you have any questions, ask your nurse or doctor.        aspirin 81 MG tablet Take 81 mg by mouth every morning.   cyclobenzaprine 5 MG tablet Commonly known as: FLEXERIL 1-2 qhs prn   fluconazole 150 MG tablet Commonly known as: DIFLUCAN 1 tab every 3 days as  needed   glucose blood test strip Commonly known as: ONE TOUCH ULTRA TEST Use to check blood sugars three times a day   glucose blood test strip Use Onetouch verio test strips as instructed to check  blood sugar three times daily.   HumuLIN R U-500 KwikPen 500 UNIT/ML kwikpen Generic drug: insulin regular human CONCENTRATED ADMINISTER 60 UNITS UNDER THE SKIN 30 MINUTES BEFORE MEALS   Lancets Misc Use as directed four times daily  E11.9   Lantus SoloStar 100 UNIT/ML Solostar Pen Generic drug: insulin glargine INJECT 90 UNITS INTO THE SKIN EVERY DAY SUBCUTANEOUSLY   losartan 50 MG tablet Commonly known as: COZAAR Take 1 tablet (50 mg total) by mouth daily.   multivitamin with minerals Tabs tablet Take 1 tablet by mouth daily.   ONE TOUCH ULTRA 2 w/Device Kit Use as directed   potassium chloride 10 MEQ tablet Commonly known as: KLOR-CON TAKE 3 TABLETS(30 MEQ) BY MOUTH DAILY   ProAir HFA 108 (90 Base) MCG/ACT inhaler Generic drug: albuterol INHALE 2 PUFFS BY MOUTH EVERY 6 HOURS AS NEEDED FOR WHEEZING OR SHORTNESS OF BREATH   rosuvastatin 20 MG tablet Commonly known as: CRESTOR TAKE 1 TABLET(20 MG) BY MOUTH DAILY   Synjardy XR 08-998 MG Tb24 Generic drug: Empagliflozin-metFORMIN HCl ER Take 2 tablets by mouth daily.       Allergies: No Known Allergies  Past Medical History:  Diagnosis Date  . ALLERGIC RHINITIS 01/29/2007  . Arthritis   . ASTHMA 01/29/2007  . ASTHMA, WITH ACUTE EXACERBATION 06/24/2008  . BACK PAIN 01/29/2009  . CHEST PAIN 06/24/2008  . Family history of adverse reaction to anesthesia    Patients daughter has N/V after anesthesia  . GERD (gastroesophageal reflux disease)   . History of shingles   . HYPERLIPIDEMIA 01/29/2007  . HYPERTENSION 01/29/2007  . OSTEOPENIA 10/16/2007  . Type II or unspecified type diabetes mellitus without mention of complication, uncontrolled 11/29/2013    Past Surgical History:  Procedure Laterality Date  . CHOLECYSTECTOMY  N/A 09/19/2015   Procedure: LAPAROSCOPIC CHOLECYSTECTOMY WITH INTRAOPERATIVE CHOLANGIOGRAM;  Surgeon: Jackolyn Confer, MD;  Location: [facility];  Service: General;  Laterality: N/A;  . COLONOSCOPY    . TONSILLECTOMY  1956  . VIDEO BRONCHOSCOPY Bilateral 03/28/2014   Procedure: VIDEO BRONCHOSCOPY WITHOUT FLUORO;  Surgeon: Tanda Rockers, MD;  Location: [facility];  Service: Cardiopulmonary;  Laterality: Bilateral;    Family History  Problem Relation Age of Onset  . Cancer Mother        ? type   . Diabetes Father   . Hypertension Father   . Heart disease Father   . Asthma Father   . Asthma Daughter   . Colon cancer Neg Hx   . Esophageal cancer Neg Hx   . Rectal cancer Neg Hx   . Stomach cancer Neg Hx   . Breast cancer Neg Hx     Social History:  reports that she quit smoking about 15 years ago. She has never used smokeless tobacco. She reports that she does not drink alcohol or use drugs.   Review of Systems   Lipid history: On Crestor 20 for lipid control, prescribed by  PCP    Lab Results  Component Value Date   CHOL 110 10/23/2018   HDL 42.90 10/23/2018   LDLCALC 48 10/23/2018   LDLDIRECT 153.4 01/29/2009   TRIG 92.0 10/23/2018   CHOLHDL 3 10/23/2018           Hypertension:On losartan Has not checked blood pressure at home  Creatinine may be higher with losartan  BP Readings from Last 3 Encounters:  08/23/19 (!) 150/84  07/04/19 (!) 160/84  05/22/19 136/72   Lab Results  Component Value Date  CREATININE 1.29 (H) 08/20/2019   CREATININE 1.21 (H) 05/17/2019   CREATININE 0.95 02/12/2019    Most recent eye exam was in 04/2018, no report available  Most recent foot exam: 3/21  Abnormal TSH: She has had annual TSH tests for screening from her PCP Had transient increase in TSH in 6/20    Lab Results  Component Value Date   TSH 3.79 02/12/2019   TSH 4.81 (H) 10/23/2018   TSH 2.24 12/13/2017   FREET4 0.64 02/12/2019      LABS:  Lab on 08/20/2019    Component Date Value Ref Range Status  . Color, Urine 08/20/2019 YELLOW  Yellow;Lt. Yellow;Straw;Dark Yellow;Amber;Green;Red;Brown Final  . APPearance 08/20/2019 CLEAR  Clear;Turbid;Slightly Cloudy;Cloudy Final  . Specific Gravity, Urine 08/20/2019 >=1.030* 1.000 - 1.030 Final  . pH 08/20/2019 5.0  5.0 - 8.0 Final  . Total Protein, Urine 08/20/2019 30* Negative Final  . Urine Glucose 08/20/2019 >=1000* Negative Final  . Ketones, ur 08/20/2019 TRACE* Negative Final  . Bilirubin Urine 08/20/2019 NEGATIVE  Negative Final  . Hgb urine dipstick 08/20/2019 NEGATIVE  Negative Final  . Urobilinogen, UA 08/20/2019 1.0  0.0 - 1.0 Final  . Leukocytes,Ua 08/20/2019 NEGATIVE  Negative Final  . Nitrite 08/20/2019 NEGATIVE  Negative Final  . WBC, UA 08/20/2019 3-6/hpf* 0-2/hpf Final  . RBC / HPF 08/20/2019 0-2/hpf  0-2/hpf Final  . Squamous Epithelial / LPF 08/20/2019 Few(5-10/hpf)* Rare(0-4/hpf) Final  . Bacteria, UA 08/20/2019 Few(10-50/hpf)* None Final  . Microalb, Ur 08/20/2019 10.0* 0.0 - 1.9 mg/dL Final  . Creatinine,U 08/20/2019 466.8  mg/dL Final  . Microalb Creat Ratio 08/20/2019 2.1  0.0 - 30.0 mg/g Final  . Sodium 08/20/2019 137  135 - 145 mEq/L Final  . Potassium 08/20/2019 4.0  3.5 - 5.1 mEq/L Final  . Chloride 08/20/2019 100  96 - 112 mEq/L Final  . CO2 08/20/2019 29  19 - 32 mEq/L Final  . Glucose, Bld 08/20/2019 347* 70 - 99 mg/dL Final  . BUN 08/20/2019 19  6 - 23 mg/dL Final  . Creatinine, Ser 08/20/2019 1.29* 0.40 - 1.20 mg/dL Final  . Total Bilirubin 08/20/2019 0.3  0.2 - 1.2 mg/dL Final  . Alkaline Phosphatase 08/20/2019 82  39 - 117 U/L Final  . AST 08/20/2019 20  0 - 37 U/L Final  . ALT 08/20/2019 23  0 - 35 U/L Final  . Total Protein 08/20/2019 7.3  6.0 - 8.3 g/dL Final  . Albumin 08/20/2019 4.3  3.5 - 5.2 g/dL Final  . GFR 08/20/2019 49.71* >60.00 mL/min Final  . Calcium 08/20/2019 9.4  8.4 - 10.5 mg/dL Final  . Hgb A1c MFr Bld 08/20/2019 10.3* 4.6 - 6.5 % Final    Glycemic Control Guidelines for People with Diabetes:Non Diabetic:  <6%Goal of Therapy: <7%Additional Action Suggested:  >8%     Physical Examination:  BP (!) 150/84 (BP Location: Left Arm, Patient Position: Sitting, Cuff Size: Normal)   Pulse 85   Ht '5\' 4"'$  (1.626 m)   Wt 182 lb 12.8 oz (82.9 kg)   SpO2 97%   BMI 31.38 kg/m         ASSESSMENT:  Diabetes type 2, with obesity on insulin  See history of present illness for detailed discussion of current diabetes management, blood sugar patterns and problems identified  Her A1c is much higher at 10.3 compared to 7.7  She is on Lantus and Humulin R U-500 3 times a day  Has an adequate compliance with insulin regimen both  with the timing of the insulin and missing some doses Blood sugars have been as high as 533 recently and she thinks they are higher when she could not get Invokamet from her insurance company approved Prescription for Adwolf had been sent but she apparently did not pick this up  She is taking larger dose of Lantus but her fasting readings are still averaging well over 200 Otherwise she is trying to be more active with going for an exercise regimen She is avoiding sweets as before but not clear what her sugars readings are after meals  Hypertension: Blood pressure is significantly higher now, may have been better with Invokana   Mild increase in creatinine: Consistent and stable  PLAN:  She was given a flowsheet to keep a record of her insulin doses and blood sugars Discussed importance of checking blood sugars after meals and blood sugar targets Prescription for Synjardy sent again She will let us know if there is any difficulty getting this filled She needs to increase fluid intake Try to have more protein at breakfast and given a list of preferred breakfast foods with consistent amount of protein She will continue the same insulin dose for now and will reassess her in 1 month However if her morning sugars  start getting below 90 she will cut down her Lantus by 20 units Discussed importance of checking blood sugars after dinner also She will make sure she takes her insulin 30-minute before eating  HYPERTENSION: No need for any treatments  Recheck renal function and lipids on the next visit  Low normal potassium: Resolved, continue supplement even though she is not on any diuretic      There are no Patient Instructions on file for this visit.    Total visit time for evaluation and management of multiple problems and counseling =25 minutes       Elayne Snare 08/23/2019, 10:39 AM   Note: This office note was prepared with Dragon voice recognition system technology. Any transcriptional errors that result from this process are unintentional.

## 2019-08-23 NOTE — Patient Instructions (Addendum)
Cut lantus by 20 units if am sugar < 90  Must take Humulin 30 min BEFORE each meal  Increase water  Check blood sugars on waking up 3 days a week  Also check blood sugars about 2 hours after meals and do this after different meals by rotation  Recommended blood sugar levels on waking up are 90-130 and about 2 hours after meal is 130-180  Please bring your blood sugar monitor to each visit, thank you

## 2019-08-30 ENCOUNTER — Encounter: Payer: Self-pay | Admitting: Internal Medicine

## 2019-08-30 ENCOUNTER — Other Ambulatory Visit: Payer: Self-pay

## 2019-08-30 ENCOUNTER — Ambulatory Visit (INDEPENDENT_AMBULATORY_CARE_PROVIDER_SITE_OTHER): Payer: Medicare Other | Admitting: Internal Medicine

## 2019-08-30 VITALS — BP 146/86 | HR 84 | Temp 99.3°F | Ht 64.0 in | Wt 183.4 lb

## 2019-08-30 DIAGNOSIS — Z0001 Encounter for general adult medical examination with abnormal findings: Secondary | ICD-10-CM | POA: Diagnosis not present

## 2019-08-30 DIAGNOSIS — M545 Low back pain, unspecified: Secondary | ICD-10-CM

## 2019-08-30 DIAGNOSIS — J45909 Unspecified asthma, uncomplicated: Secondary | ICD-10-CM

## 2019-08-30 DIAGNOSIS — N289 Disorder of kidney and ureter, unspecified: Secondary | ICD-10-CM | POA: Diagnosis not present

## 2019-08-30 DIAGNOSIS — M549 Dorsalgia, unspecified: Secondary | ICD-10-CM | POA: Diagnosis not present

## 2019-08-30 DIAGNOSIS — I10 Essential (primary) hypertension: Secondary | ICD-10-CM

## 2019-08-30 DIAGNOSIS — J069 Acute upper respiratory infection, unspecified: Secondary | ICD-10-CM

## 2019-08-30 DIAGNOSIS — E119 Type 2 diabetes mellitus without complications: Secondary | ICD-10-CM

## 2019-08-30 LAB — CBC WITH DIFFERENTIAL/PLATELET
Basophils Absolute: 0 10*3/uL (ref 0.0–0.1)
Basophils Relative: 0.9 % (ref 0.0–3.0)
Eosinophils Absolute: 0.1 10*3/uL (ref 0.0–0.7)
Eosinophils Relative: 1.9 % (ref 0.0–5.0)
HCT: 38.4 % (ref 36.0–46.0)
Hemoglobin: 12.1 g/dL (ref 12.0–15.0)
Lymphocytes Relative: 43.3 % (ref 12.0–46.0)
Lymphs Abs: 2.2 10*3/uL (ref 0.7–4.0)
MCHC: 31.5 g/dL (ref 30.0–36.0)
MCV: 73.8 fl — ABNORMAL LOW (ref 78.0–100.0)
Monocytes Absolute: 0.3 10*3/uL (ref 0.1–1.0)
Monocytes Relative: 6.4 % (ref 3.0–12.0)
Neutro Abs: 2.4 10*3/uL (ref 1.4–7.7)
Neutrophils Relative %: 47.5 % (ref 43.0–77.0)
Platelets: 213 10*3/uL (ref 150.0–400.0)
RBC: 5.21 Mil/uL — ABNORMAL HIGH (ref 3.87–5.11)
RDW: 16.1 % — ABNORMAL HIGH (ref 11.5–15.5)
WBC: 5.1 10*3/uL (ref 4.0–10.5)

## 2019-08-30 LAB — BASIC METABOLIC PANEL
BUN: 16 mg/dL (ref 6–23)
CO2: 29 mEq/L (ref 19–32)
Calcium: 10.1 mg/dL (ref 8.4–10.5)
Chloride: 106 mEq/L (ref 96–112)
Creatinine, Ser: 1.09 mg/dL (ref 0.40–1.20)
GFR: 60.37 mL/min (ref >60.00)
Glucose, Bld: 168 mg/dL — ABNORMAL HIGH (ref 70–99)
Potassium: 4 mEq/L (ref 3.5–5.1)
Sodium: 141 mEq/L (ref 135–145)

## 2019-08-30 LAB — URINALYSIS, ROUTINE W REFLEX MICROSCOPIC
Bilirubin Urine: NEGATIVE
Hgb urine dipstick: NEGATIVE
Ketones, ur: NEGATIVE
Leukocytes,Ua: NEGATIVE
Nitrite: NEGATIVE
RBC / HPF: NONE SEEN (ref 0–?)
Specific Gravity, Urine: 1.02 (ref 1.000–1.030)
Total Protein, Urine: NEGATIVE
Urine Glucose: >=1000 — AB
Urobilinogen, UA: 0.2 (ref 0.0–1.0)
pH: 5.5 (ref 5.0–8.0)

## 2019-08-30 LAB — LIPID PANEL
Cholesterol: 138 mg/dL (ref 0–200)
HDL: 52 mg/dL (ref >39.00)
LDL Cholesterol: 64 mg/dL (ref 0–99)
NonHDL: 86.1
Total CHOL/HDL Ratio: 3
Triglycerides: 111 mg/dL (ref 0.0–149.0)
VLDL: 22.2 mg/dL (ref 0.0–40.0)

## 2019-08-30 LAB — TSH: TSH: 3.66 u[IU]/mL (ref 0.35–4.50)

## 2019-08-30 LAB — HEPATIC FUNCTION PANEL
ALT: 18 U/L (ref 0–35)
AST: 16 U/L (ref 0–37)
Albumin: 4.4 g/dL (ref 3.5–5.2)
Alkaline Phosphatase: 87 U/L (ref 39–117)
Bilirubin, Direct: 0.1 mg/dL (ref 0.0–0.3)
Total Bilirubin: 0.3 mg/dL (ref 0.2–1.2)
Total Protein: 7.3 g/dL (ref 6.0–8.3)

## 2019-08-30 LAB — SARS-COV-2 IGG: SARS-COV-2 IgG: 5.78

## 2019-08-30 MED ORDER — TIZANIDINE HCL 2 MG PO TABS: 2.0000 mg | ORAL_TABLET | Freq: Four times a day (QID) | ORAL | 1 refills | Status: DC | PRN

## 2019-08-30 NOTE — Assessment & Plan Note (Signed)

## 2019-08-30 NOTE — Assessment & Plan Note (Addendum)
C/w msk strain, for muscle relaxer prn  I spent 41 minutes in addition to time for CPX wellness examination in preparing to see the patient by review of recent labs, imaging and procedures, obtaining and reviewing separately obtained history, communicating with the patient and family or caregiver, ordering medications, tests or procedures, and documenting clinical information in the EHR including the differential Dx, treatment, and any further evaluation and other management of upper back pain , left lower back pain, uRI, renal insufficiency, htn, dm, asthma

## 2019-08-30 NOTE — Assessment & Plan Note (Signed)
stable overall by history and exam, recent data reviewed with pt, and pt to continue medical treatment as before,  to f/u any worsening symptoms or concerns  

## 2019-08-30 NOTE — Assessment & Plan Note (Signed)
C/w msk strain, for muscle relaxer prn

## 2019-08-30 NOTE — Assessment & Plan Note (Signed)
Recent resolved, for sarscovid igg
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
## 2019-08-30 NOTE — Assessment & Plan Note (Signed)
?  CKD  - for f/u labs

## 2019-08-30 NOTE — Patient Instructions (Signed)
Please take all new medication as prescribed - the muscle relaxer as needed  Please continue all other medications as before, and refills have been done if requested.  Please have the pharmacy call with any other refills you may need.  Please continue your efforts at being more active, low cholesterol diet, and weight control.  You are otherwise up to date with prevention measures today.  Please keep your appointments with your specialists as you may have planned  Please go to the LAB at the blood drawing area for the tests to be done  You will be contacted by phone if any changes need to be made immediately.  Otherwise, you will receive a letter about your results with an explanation, but please check with MyChart first.  Please remember to sign up for MyChart if you have not done so, as this will be important to you in the future with finding out test results, communicating by private email, and scheduling acute appointments online when needed.

## 2019-08-31 ENCOUNTER — Encounter: Payer: Self-pay | Admitting: Internal Medicine

## 2019-08-31 DIAGNOSIS — U071 COVID-19: Secondary | ICD-10-CM | POA: Insufficient documentation

## 2019-09-16 ENCOUNTER — Other Ambulatory Visit: Payer: Self-pay | Admitting: Endocrinology

## 2019-09-20 ENCOUNTER — Other Ambulatory Visit (INDEPENDENT_AMBULATORY_CARE_PROVIDER_SITE_OTHER): Payer: BC Managed Care – PPO

## 2019-09-20 ENCOUNTER — Other Ambulatory Visit: Payer: Self-pay

## 2019-09-20 DIAGNOSIS — E1165 Type 2 diabetes mellitus with hyperglycemia: Secondary | ICD-10-CM

## 2019-09-20 DIAGNOSIS — Z794 Long term (current) use of insulin: Secondary | ICD-10-CM

## 2019-09-20 DIAGNOSIS — E782 Mixed hyperlipidemia: Secondary | ICD-10-CM

## 2019-09-20 LAB — LIPID PANEL
Cholesterol: 113 mg/dL (ref 0–200)
HDL: 38.4 mg/dL — ABNORMAL LOW (ref >39.00)
LDL Cholesterol: 55 mg/dL (ref 0–99)
NonHDL: 75.03
Total CHOL/HDL Ratio: 3
Triglycerides: 101 mg/dL (ref 0.0–149.0)
VLDL: 20.2 mg/dL (ref 0.0–40.0)

## 2019-09-20 LAB — BASIC METABOLIC PANEL
BUN: 19 mg/dL (ref 6–23)
CO2: 27 mEq/L (ref 19–32)
Calcium: 9.8 mg/dL (ref 8.4–10.5)
Chloride: 105 mEq/L (ref 96–112)
Creatinine, Ser: 1 mg/dL (ref 0.40–1.20)
GFR: 66.67 mL/min (ref >60.00)
Glucose, Bld: 132 mg/dL — ABNORMAL HIGH (ref 70–99)
Potassium: 3.9 mEq/L (ref 3.5–5.1)
Sodium: 140 mEq/L (ref 135–145)

## 2019-09-21 LAB — FRUCTOSAMINE: Fructosamine: 292 umol/L — ABNORMAL HIGH (ref 0–285)

## 2019-09-25 ENCOUNTER — Encounter: Payer: Self-pay | Admitting: Endocrinology

## 2019-09-25 ENCOUNTER — Other Ambulatory Visit: Payer: Self-pay

## 2019-09-25 ENCOUNTER — Ambulatory Visit (INDEPENDENT_AMBULATORY_CARE_PROVIDER_SITE_OTHER): Payer: BC Managed Care – PPO | Admitting: Endocrinology

## 2019-09-25 VITALS — BP 140/82 | HR 96 | Ht 64.0 in | Wt 186.2 lb

## 2019-09-25 DIAGNOSIS — Z794 Long term (current) use of insulin: Secondary | ICD-10-CM

## 2019-09-25 DIAGNOSIS — I10 Essential (primary) hypertension: Secondary | ICD-10-CM

## 2019-09-25 DIAGNOSIS — E1165 Type 2 diabetes mellitus with hyperglycemia: Secondary | ICD-10-CM | POA: Diagnosis not present

## 2019-09-25 NOTE — Progress Notes (Signed)
Patient ID: Deborah Neal, female   DOB: Dec 25, 1950, 69 y.o.   MRN: [id number]          Reason for Appointment: Follow-up for Type 2 Diabetes  Referring physician: [name]   History of Present Illness:          Date of diagnosis of type 2 diabetes mellitus:        Background history: She was initially started on metformin but apparently since severely since blood sugar not controlled she was switched to insulin in 2018 She has taken various types of insulin in the past including premixed insulin with usually poor control Does not have a history of taking a GLP-1 drug or medication like Invokana Her blood sugars were under fair control until 6/18 when her  A1c+ went up to 9.2 and subsequently has been higher  Recent history:    INSULIN regimen:  Humulin R U-500, 30 units in the morning, 30 units ac/pc and dinner  Lantus 90 units in the evening  Non-insulin hypoglycemic drugs: None was on Invokamet XR 50/1000, 2 tablets daily  Her A1c is in April higher at 10.3 compared to 7.7 Fructosamine of 292 indicates relatively better control  Current management, blood sugar patterns and problems identified:  Blood sugars are improving with restarting Synjardy on her last visit on 4/29  She is able to check her sugars regularly but as before she forgets to check them after dinner at night despite recommendations on each visit  She now thinks that she will frequently forget to take her insulin in the evening before eating and will do it after  This is despite her not eating out and usually eating at home  FASTING blood sugars are still averaging near 180 even with her taking 90 units of Lantus  Her breakfast meals are somewhat variable and she does not always eat cereal or oatmeal now  May be having lower readings at lunchtime at times when she is eating only boiled eggs  Her weight has gone up 4 pounds despite trying to exercise periodically and starting Synjardy  She was also  advised to keep a record of her insulin doses in a flowsheet and she does not do so        Side effects from medications have been: None   Compliance with the medical regimen: Fair   Glucose monitoring:  done 1-2 times a day         Glucometer: One Touch Ultra.       Blood Glucose readings by review of meter and median   PRE-MEAL Fasting Lunch Dinner Bedtime Overall  Glucose range:  143-238  58-291     Mean/median:  180  157   177   POST-MEAL PC Breakfast PC Lunch PC Dinner  Glucose range:   ?  Mean/median:      Previously:   PRE-MEAL Fasting Lunch Dinner Bedtime Overall  Glucose range:  233-533  219-445     Mean/median:  300  404    259      Typical meal intake: Breakfast is cereal, oatmeal or toast and boiled egg, lunch  or meat sandwich  Usually has meat and potatoes at dinner at 7 pm Snacks: Popcorn,  or fruit                Dietician visit, most recent: 6/19  Weight history:  Wt Readings from Last 3 Encounters:  09/25/19 186 lb 3.2 oz (84.5 kg)  08/30/19 183 lb 6 oz (  83.2 kg)  08/23/19 182 lb 12.8 oz (82.9 kg)    Glycemic control:   Lab Results  Component Value Date   HGBA1C 10.3 (H) 08/20/2019   HGBA1C 7.7 (H) 05/17/2019   HGBA1C 8.0 (H) 02/12/2019   Lab Results  Component Value Date   MICROALBUR 10.0 (H) 08/20/2019   LDLCALC 55 09/20/2019   CREATININE 1.00 09/20/2019   Lab Results  Component Value Date   MICRALBCREAT 2.1 08/20/2019    Lab Results  Component Value Date   FRUCTOSAMINE 292 (H) 09/20/2019   FRUCTOSAMINE 277 02/12/2019   FRUCTOSAMINE 277 01/09/2018   Lab on 09/20/2019  Component Date Value Ref Range Status   Cholesterol 09/20/2019 113  0 - 200 mg/dL Final   ATP III Classification       Desirable:  < 200 mg/dL               Borderline High:  200 - 239 mg/dL          High:  > = 240 mg/dL   Triglycerides 09/20/2019 101.0  0.0 - 149.0 mg/dL Final   Normal:  <150 mg/dLBorderline High:  150 - 199 mg/dL   HDL 09/20/2019  38.40* >39.00 mg/dL Final   VLDL 09/20/2019 20.2  0.0 - 40.0 mg/dL Final   LDL Cholesterol 09/20/2019 55  0 - 99 mg/dL Final   Total CHOL/HDL Ratio 09/20/2019 3   Final                  Men          Women1/2 Average Risk     3.4          3.3Average Risk          5.0          4.42X Average Risk          9.6          7.13X Average Risk          15.0          11.0                       NonHDL 09/20/2019 75.03   Final   NOTE:  Non-HDL goal should be 30 mg/dL higher than patient's LDL goal (i.e. LDL goal of < 70 mg/dL, would have non-HDL goal of < 100 mg/dL)   Fructosamine 09/20/2019 292* 0 - 285 umol/L Final   Comment: Published reference interval for apparently healthy subjects between age 64 and 74 is 51 - 285 umol/L and in a poorly controlled diabetic population is 228 - 563 umol/L with a mean of 396 umol/L.    Sodium 09/20/2019 140  135 - 145 mEq/L Final   Potassium 09/20/2019 3.9  3.5 - 5.1 mEq/L Final   Chloride 09/20/2019 105  96 - 112 mEq/L Final   CO2 09/20/2019 27  19 - 32 mEq/L Final   Glucose, Bld 09/20/2019 132* 70 - 99 mg/dL Final   BUN 09/20/2019 19  6 - 23 mg/dL Final   Creatinine, Ser 09/20/2019 1.00  0.40 - 1.20 mg/dL Final   GFR 09/20/2019 66.67  >60.00 mL/min Final   Calcium 09/20/2019 9.8  8.4 - 10.5 mg/dL Final      Allergies as of 09/25/2019   No Known Allergies     Medication List       Accurate as of September 25, 2019 11:00 AM. If you have any questions, ask your nurse  or doctor.        aspirin 81 MG tablet Take 81 mg by mouth every morning.   cyclobenzaprine 5 MG tablet Commonly known as: FLEXERIL 1-2 qhs prn   fluconazole 150 MG tablet Commonly known as: DIFLUCAN 1 tab every 3 days as needed   glucose blood test strip Commonly known as: ONE TOUCH ULTRA TEST Use to check blood sugars three times a day   OneTouch Verio test strip Generic drug: glucose blood USE AS DIRECTED THREE TIMES DAILY   HumuLIN R U-500 KwikPen 500 UNIT/ML  kwikpen Generic drug: insulin regular human CONCENTRATED ADMINISTER 60 UNITS UNDER THE SKIN 30 MINUTES BEFORE MEALS   Lancets Misc Use as directed four times daily  E11.9   Lantus SoloStar 100 UNIT/ML Solostar Pen Generic drug: insulin glargine INJECT 90 UNITS INTO THE SKIN EVERY DAY SUBCUTANEOUSLY   losartan 50 MG tablet Commonly known as: COZAAR Take 1 tablet (50 mg total) by mouth daily.   multivitamin with minerals Tabs tablet Take 1 tablet by mouth daily.   ONE TOUCH ULTRA 2 w/Device Kit Use as directed   potassium chloride 10 MEQ tablet Commonly known as: KLOR-CON TAKE 3 TABLETS(30 MEQ) BY MOUTH DAILY   ProAir HFA 108 (90 Base) MCG/ACT inhaler Generic drug: albuterol INHALE 2 PUFFS BY MOUTH EVERY 6 HOURS AS NEEDED FOR WHEEZING OR SHORTNESS OF BREATH   rosuvastatin 20 MG tablet Commonly known as: CRESTOR TAKE 1 TABLET(20 MG) BY MOUTH DAILY   Synjardy XR 08-998 MG Tb24 Generic drug: Empagliflozin-metFORMIN HCl ER Take 2 tablets by mouth daily.   tiZANidine 2 MG tablet Commonly known as: ZANAFLEX Take 1 tablet (2 mg total) by mouth every 6 (six) hours as needed for muscle spasms.       Allergies: No Known Allergies  Past Medical History:  Diagnosis Date   ALLERGIC RHINITIS 01/29/2007   Arthritis    ASTHMA 01/29/2007   ASTHMA, WITH ACUTE EXACERBATION 06/24/2008   BACK PAIN 01/29/2009   CHEST PAIN 06/24/2008   Family history of adverse reaction to anesthesia    Patients daughter has N/V after anesthesia   GERD (gastroesophageal reflux disease)    History of shingles    HYPERLIPIDEMIA 01/29/2007   HYPERTENSION 01/29/2007   OSTEOPENIA 10/16/2007   Type II or unspecified type diabetes mellitus without mention of complication, uncontrolled 11/29/2013    Past Surgical History:  Procedure Laterality Date   CHOLECYSTECTOMY N/A 09/19/2015   Procedure: LAPAROSCOPIC CHOLECYSTECTOMY WITH INTRAOPERATIVE CHOLANGIOGRAM;  Surgeon: Jackolyn Confer, MD;  Location:  [facility];  Service: General;  Laterality: N/A;   COLONOSCOPY     TONSILLECTOMY  1956   VIDEO BRONCHOSCOPY Bilateral 03/28/2014   Procedure: VIDEO BRONCHOSCOPY WITHOUT FLUORO;  Surgeon: Tanda Rockers, MD;  Location: [facility];  Service: Cardiopulmonary;  Laterality: Bilateral;    Family History  Problem Relation Age of Onset   Cancer Mother        ? type    Diabetes Father    Hypertension Father    Heart disease Father    Asthma Father    Asthma Daughter    Colon cancer Neg Hx    Esophageal cancer Neg Hx    Rectal cancer Neg Hx    Stomach cancer Neg Hx    Breast cancer Neg Hx     Social History:  reports that she quit smoking about 16 years ago. She has never used smokeless tobacco. She reports that she does not drink alcohol or use drugs.  Review of Systems   Lipid history: On Crestor 20 for lipid control, prescribed by  PCP    Lab Results  Component Value Date   CHOL 113 09/20/2019   HDL 38.40 (L) 09/20/2019   LDLCALC 55 09/20/2019   LDLDIRECT 153.4 01/29/2009   TRIG 101.0 09/20/2019   CHOLHDL 3 09/20/2019           Hypertension:On losartan, blood pressure is better with going back on Jardiance  BP Readings from Last 3 Encounters:  09/25/19 140/82  08/30/19 (!) 146/86  08/23/19 (!) 150/84   Lab Results  Component Value Date   CREATININE 1.00 09/20/2019   CREATININE 1.09 08/30/2019   CREATININE 1.29 (H) 08/20/2019    Most recent eye exam was in 04/2018  Most recent foot exam: 3/21  Abnormal TSH: She has had annual TSH tests for screening from her PCP Had transient increase in TSH in 6/20    Lab Results  Component Value Date   TSH 3.66 08/30/2019   TSH 3.79 02/12/2019   TSH 4.81 (H) 10/23/2018   FREET4 0.64 02/12/2019      LABS:  Lab on 09/20/2019  Component Date Value Ref Range Status   Cholesterol 09/20/2019 113  0 - 200 mg/dL Final   ATP III Classification       Desirable:  < 200 mg/dL               Borderline High:  200  - 239 mg/dL          High:  > = 240 mg/dL   Triglycerides 09/20/2019 101.0  0.0 - 149.0 mg/dL Final   Normal:  <150 mg/dLBorderline High:  150 - 199 mg/dL   HDL 09/20/2019 38.40* >39.00 mg/dL Final   VLDL 09/20/2019 20.2  0.0 - 40.0 mg/dL Final   LDL Cholesterol 09/20/2019 55  0 - 99 mg/dL Final   Total CHOL/HDL Ratio 09/20/2019 3   Final                  Men          Women1/2 Average Risk     3.4          3.3Average Risk          5.0          4.42X Average Risk          9.6          7.13X Average Risk          15.0          11.0                       NonHDL 09/20/2019 75.03   Final   NOTE:  Non-HDL goal should be 30 mg/dL higher than patient's LDL goal (i.e. LDL goal of < 70 mg/dL, would have non-HDL goal of < 100 mg/dL)   Fructosamine 09/20/2019 292* 0 - 285 umol/L Final   Comment: Published reference interval for apparently healthy subjects between age 27 and 29 is 41 - 285 umol/L and in a poorly controlled diabetic population is 228 - 563 umol/L with a mean of 396 umol/L.    Sodium 09/20/2019 140  135 - 145 mEq/L Final   Potassium 09/20/2019 3.9  3.5 - 5.1 mEq/L Final   Chloride 09/20/2019 105  96 - 112 mEq/L Final   CO2 09/20/2019 27  19 - 32 mEq/L Final   Glucose, Bld 09/20/2019 132* 70 -  99 mg/dL Final   BUN 09/20/2019 19  6 - 23 mg/dL Final   Creatinine, Ser 09/20/2019 1.00  0.40 - 1.20 mg/dL Final   GFR 09/20/2019 66.67  >60.00 mL/min Final   Calcium 09/20/2019 9.8  8.4 - 10.5 mg/dL Final    Physical Examination:  BP 140/82 (BP Location: Left Arm, Patient Position: Sitting, Cuff Size: Normal)    Pulse 96    Ht _0  (1.626 m)    Wt 186 lb 3.2 oz (84.5 kg)    SpO2 94%    BMI 31.96 kg/m         ASSESSMENT:  Diabetes type 2, with obesity on insulin  See history of present illness for detailed discussion of current diabetes management, blood sugar patterns and problems identified  Her A1c was on the last visit 10.3 However fructosamine of 292 indicates  relatively better control  She is on Lantus once a day and Humulin R U-500 2 times a day along with Synjardy XR  Blood sugars are still not controlled Average blood sugar in the morning is 280 However not clear what her sugars are after dinner As before she is not checking her blood sugars after evening meal Also likely taking mealtime insulin postprandially at least in the evenings She had difficulty with following instructions for monitoring and insulin as well as likely inconsistent diet Weight has gone up even though she has gone back on Jardiance  Discussed A1c target of 7  Hypertension: Blood pressure is improving, likely with adding back Jardiance   Mild increase in creatinine: Improved and stable, not increased with adding Jardiance  PLAN:  She was again given a flowsheet to keep a record of her insulin doses and blood sugars Explained to her the need to take insulin before eating at suppertime at least She will reduce her mealtime dose to 40 units in the morning if not eating any carbohydrates like oatmeal Continue Synjardy and change Increase Lantus to 94 for now, maintain the dose unless morning sugars are getting higher again She will be keeping the dose of evening Humulin R the same for now Today showed her the V-go pump as well as how this will be used to replace her basal bolus insulin regimen, improved blood sugars and make it easier for her to comply with mealtime coverage She is quite interested in this and will verify insurance coverage, patient brochure given  HYPERTENSION: She will continue the same regimen  Low normal potassium: Controlled with potassium supplements   Follow-up when she starts the V-go pump     There are no Patient Instructions on file for this visit.           Elayne Snare 09/25/2019, 11:00 AM   Note: This office note was prepared with Dragon voice recognition system technology. Any transcriptional errors that result from this  process are unintentional.

## 2019-09-25 NOTE — Patient Instructions (Addendum)
Take supper dose BEFORE EATING always  If eating eggs in am take only 20 units  Take 94 LANTUS  Check blood sugars on waking up 5 days a week  Also check blood sugars about 2 hours after meals and do this after different meals by rotation  Recommended blood sugar levels on waking up are 90-130 and about 2 hours after meal is 130-180  Please bring your blood sugar monitor to each visit, thank you

## 2019-09-27 ENCOUNTER — Telehealth: Payer: Self-pay

## 2019-09-27 NOTE — Telephone Encounter (Signed)
FAXED [place]: [facility]  Document: Order form for v-go insulin pump Other records requested: none at this time.  All above requested information has been faxed successfully to [employer] listed above. Documents and fax confirmation have been placed in the faxed file for future reference.

## 2020-01-09 ENCOUNTER — Ambulatory Visit (INDEPENDENT_AMBULATORY_CARE_PROVIDER_SITE_OTHER): Payer: Medicare Other | Admitting: Internal Medicine

## 2020-01-09 ENCOUNTER — Encounter: Payer: Self-pay | Admitting: Internal Medicine

## 2020-01-09 ENCOUNTER — Other Ambulatory Visit: Payer: Self-pay

## 2020-01-09 VITALS — BP 130/70 | HR 93 | Temp 98.7°F | Ht 64.0 in | Wt 177.0 lb

## 2020-01-09 DIAGNOSIS — J069 Acute upper respiratory infection, unspecified: Secondary | ICD-10-CM | POA: Diagnosis not present

## 2020-01-09 DIAGNOSIS — J45909 Unspecified asthma, uncomplicated: Secondary | ICD-10-CM | POA: Diagnosis not present

## 2020-01-09 DIAGNOSIS — E119 Type 2 diabetes mellitus without complications: Secondary | ICD-10-CM

## 2020-01-09 DIAGNOSIS — Z23 Encounter for immunization: Secondary | ICD-10-CM

## 2020-01-09 DIAGNOSIS — I10 Essential (primary) hypertension: Secondary | ICD-10-CM | POA: Diagnosis not present

## 2020-01-09 MED ORDER — SYNJARDY XR 5-1000 MG PO TB24
2.0000 | ORAL_TABLET | Freq: Every day | ORAL | 2 refills | Status: DC
Start: 2020-01-09 — End: 2020-08-18

## 2020-01-09 MED ORDER — ALBUTEROL SULFATE HFA 108 (90 BASE) MCG/ACT IN AERS: INHALATION_SPRAY | RESPIRATORY_TRACT | 5 refills | Status: DC

## 2020-01-09 MED ORDER — ROSUVASTATIN CALCIUM 20 MG PO TABS
ORAL_TABLET | ORAL | 3 refills | Status: DC
Start: 2020-01-09 — End: 2021-01-05

## 2020-01-09 MED ORDER — POTASSIUM CHLORIDE ER 10 MEQ PO TBCR
EXTENDED_RELEASE_TABLET | ORAL | 3 refills | Status: DC
Start: 2020-01-09 — End: 2020-05-06

## 2020-01-09 MED ORDER — LOSARTAN POTASSIUM 50 MG PO TABS
50.0000 mg | ORAL_TABLET | Freq: Every day | ORAL | 3 refills | Status: DC
Start: 2020-01-09 — End: 2021-01-05

## 2020-01-09 NOTE — Addendum Note (Signed)
Addended by: Biagio Borg on: 01/09/2020 09:00 PM   Modules accepted: Orders

## 2020-01-09 NOTE — Progress Notes (Signed)
Subjective:    Patient ID: Deborah Neal, female    DOB: 1951/01/16, 69 y.o.   MRN: [id number]  HPI  Here to f/u; overall doing ok,  Pt denies chest pain, increasing sob or doe, wheezing, orthopnea, PND, increased LE swelling, palpitations, dizziness or syncope.  Pt denies new neurological symptoms such as new headache, or facial or extremity weakness or numbness.  Pt denies polydipsia, polyuria, or low sugar episode.  Pt states overall good compliance with meds, mostly trying to follow appropriate diet, with wt overall stable,no new complaints  Past Medical History:  Diagnosis Date  . ALLERGIC RHINITIS 01/29/2007  . Arthritis   . ASTHMA 01/29/2007  . ASTHMA, WITH ACUTE EXACERBATION 06/24/2008  . BACK PAIN 01/29/2009  . CHEST PAIN 06/24/2008  . Family history of adverse reaction to anesthesia    Patients daughter has N/V after anesthesia  . GERD (gastroesophageal reflux disease)   . History of shingles   . HYPERLIPIDEMIA 01/29/2007  . HYPERTENSION 01/29/2007  . OSTEOPENIA 10/16/2007  . Type II or unspecified type diabetes mellitus without mention of complication, uncontrolled 11/29/2013   Past Surgical History:  Procedure Laterality Date  . CHOLECYSTECTOMY N/A 09/19/2015   Procedure: LAPAROSCOPIC CHOLECYSTECTOMY WITH INTRAOPERATIVE CHOLANGIOGRAM;  Surgeon: Jackolyn Confer, MD;  Location: [facility];  Service: General;  Laterality: N/A;  . COLONOSCOPY    . TONSILLECTOMY  1956  . VIDEO BRONCHOSCOPY Bilateral 03/28/2014   Procedure: VIDEO BRONCHOSCOPY WITHOUT FLUORO;  Surgeon: Tanda Rockers, MD;  Location: [facility];  Service: Cardiopulmonary;  Laterality: Bilateral;    reports that she quit smoking about 16 years ago. She has never used smokeless tobacco. She reports that she does not drink alcohol and does not use drugs. family history includes Asthma in her daughter and father; Cancer in her mother; Diabetes in her father; Heart disease in her father; Hypertension in her father. No Known  Allergies Current Outpatient Medications on File Prior to Visit  Medication Sig Dispense Refill  . aspirin 81 MG tablet Take 81 mg by mouth every morning.     . benazepril (LOTENSIN) 20 MG tablet Take 20 mg by mouth daily.    . Blood Glucose Monitoring Suppl (ONE TOUCH ULTRA 2) w/Device KIT Use as directed 1 each 0  . cyclobenzaprine (FLEXERIL) 5 MG tablet 1-2 qhs prn 14 tablet 0  . Empagliflozin-metFORMIN HCl ER (SYNJARDY XR) 08-998 MG TB24 Take 2 tablets by mouth daily. 60 tablet 2  . fluconazole (DIFLUCAN) 150 MG tablet 1 tab every 3 days as needed 2 tablet 1  . glucose blood (ONE TOUCH ULTRA TEST) test strip Use to check blood sugars three times a day 300 each 11  . HUMULIN R U-500 KWIKPEN 500 UNIT/ML kwikpen ADMINISTER 60 UNITS UNDER THE SKIN 30 MINUTES BEFORE MEALS 6 mL 3  . Lancets MISC Use as directed four times daily  E11.9 400 each 11  . LANTUS SOLOSTAR 100 UNIT/ML Solostar Pen INJECT 90 UNITS INTO THE SKIN EVERY DAY SUBCUTANEOUSLY 15 mL 0  . losartan (COZAAR) 50 MG tablet Take 1 tablet (50 mg total) by mouth daily. 90 tablet 3  . Multiple Vitamin (MULTIVITAMIN WITH MINERALS) TABS tablet Take 1 tablet by mouth daily.    Glory Rosebush VERIO test strip USE AS DIRECTED THREE TIMES DAILY 100 strip 4  . potassium chloride (K-DUR) 10 MEQ tablet TAKE 3 TABLETS(30 MEQ) BY MOUTH DAILY 270 tablet 3  . PROAIR HFA 108 (90 Base) MCG/ACT inhaler INHALE 2 PUFFS BY  MOUTH EVERY 6 HOURS AS NEEDED FOR WHEEZING OR SHORTNESS OF BREATH 8.5 g 0  . rosuvastatin (CRESTOR) 20 MG tablet TAKE 1 TABLET(20 MG) BY MOUTH DAILY 90 tablet 3  . tiZANidine (ZANAFLEX) 2 MG tablet Take 1 tablet (2 mg total) by mouth every 6 (six) hours as needed for muscle spasms. 30 tablet 1   No current facility-administered medications on file prior to visit.   Review of Systems All otherwise neg per pt    Objective:   Physical Exam BP 130/70 (BP Location: Left Arm, Patient Position: Sitting, Cuff Size: Large)   Pulse 93   Temp  98.7 F (37.1 C) (Oral)   Ht '5\' 4"'  (1.626 m)   Wt 177 lb (80.3 kg)   SpO2 94%   BMI 30.38 kg/m  VS noted,  Constitutional: Pt appears in NAD HENT: Head: NCAT.  Right Ear: External ear normal.  Left Ear: External ear normal.  Eyes: . Pupils are equal, round, and reactive to light. Conjunctivae and EOM are normal Nose: without d/c or deformity Neck: Neck supple. Gross normal ROM Cardiovascular: Normal rate and regular rhythm.   Pulmonary/Chest: Effort normal and breath sounds without rales or wheezing.  Abd:  Soft, NT, ND, + BS, no organomegaly Neurological: Pt is alert. At baseline orientation, motor grossly intact Skin: Skin is warm. No rashes, other new lesions, no LE edema Psychiatric: Pt behavior is normal without agitation  All otherwise neg per pt  Lab Results  Component Value Date   WBC 5.1 08/30/2019   HGB 12.1 08/30/2019   HCT 38.4 08/30/2019   PLT 213.0 08/30/2019   GLUCOSE 132 (H) 09/20/2019   CHOL 113 09/20/2019   TRIG 101.0 09/20/2019   HDL 38.40 (L) 09/20/2019   LDLDIRECT 153.4 01/29/2009   LDLCALC 55 09/20/2019   ALT 18 08/30/2019   AST 16 08/30/2019   NA 140 09/20/2019   K 3.9 09/20/2019   CL 105 09/20/2019   CREATININE 1.00 09/20/2019   BUN 19 09/20/2019   CO2 27 09/20/2019   TSH 3.66 08/30/2019   HGBA1C 10.3 (H) 08/20/2019   MICROALBUR 10.0 (H) 08/20/2019       Assessment & Plan:

## 2020-01-09 NOTE — Assessment & Plan Note (Addendum)
stable overall by history and exam, recent data reviewed with pt, and pt to continue medical treatment as before,  to f/u any worsening symptoms or concerns, for re referral endo  I spent 31 minutes in preparing to see the patient by review of recent labs, imaging and procedures, obtaining and reviewing separately obtained history, communicating with the patient and family or caregiver, ordering medications, tests or procedures, and documenting clinical information in the EHR including the differential Dx, treatment, and any further evaluation and other management of dm, htn, asthma

## 2020-01-09 NOTE — Patient Instructions (Addendum)
You had the flu shot today  Please remember to see your eye doctor yearly.   You will be contacted regarding the referral for: Dr Dwyane Dee since you dont have an appt now  Please continue all other medications as before, and refills have been done if requested.  Please have the pharmacy call with any other refills you may need.  Please continue your efforts at being more active, low cholesterol diet, and weight control.  You are otherwise up to date with prevention measures today.  Please keep your appointments with your specialists as you may have planned  Please go to the LAB at the blood drawing area for the tests to be done  You will be contacted by phone if any changes need to be made immediately.  Otherwise, you will receive a letter about your results with an explanation, but please check with MyChart first.  Please remember to sign up for MyChart if you have not done so, as this will be important to you in the future with finding out test results, communicating by private email, and scheduling acute appointments online when needed.  Please make an Appointment to return in 6 months, or sooner if needed

## 2020-01-09 NOTE — Assessment & Plan Note (Signed)
stable overall by history and exam, recent data reviewed with pt, and pt to continue medical treatment as before,  to f/u any worsening symptoms or concerns  

## 2020-01-10 ENCOUNTER — Telehealth: Payer: Self-pay | Admitting: Internal Medicine

## 2020-01-10 LAB — COMPLETE METABOLIC PANEL WITH GFR
AG Ratio: 1.6 (calc) (ref 1.0–2.5)
ALT: 20 U/L (ref 6–29)
AST: 19 U/L (ref 10–35)
Albumin: 4.4 g/dL (ref 3.6–5.1)
Alkaline phosphatase (APISO): 102 U/L (ref 37–153)
BUN: 19 mg/dL (ref 7–25)
CO2: 27 mmol/L (ref 20–32)
Calcium: 9.5 mg/dL (ref 8.6–10.4)
Chloride: 99 mmol/L (ref 98–110)
Creat: 0.96 mg/dL (ref 0.50–0.99)
GFR, Est African American: 70 mL/min/{1.73_m2} (ref >=60)
GFR, Est Non African American: 61 mL/min/{1.73_m2} (ref >=60)
Globulin: 2.7 g/dL (calc) (ref 1.9–3.7)
Glucose, Bld: 605 mg/dL (ref 65–99)
Potassium: 3.5 mmol/L (ref 3.5–5.3)
Sodium: 138 mmol/L (ref 135–146)
Total Bilirubin: 0.3 mg/dL (ref 0.2–1.2)
Total Protein: 7.1 g/dL (ref 6.1–8.1)

## 2020-01-10 LAB — LIPID PANEL
Cholesterol: 109 mg/dL (ref <200)
HDL: 36 mg/dL — ABNORMAL LOW (ref >=50)
LDL Cholesterol (Calc): 50 mg/dL (calc)
Non-HDL Cholesterol (Calc): 73 mg/dL (calc) (ref <130)
Total CHOL/HDL Ratio: 3 (calc) (ref <5.0)
Triglycerides: 149 mg/dL (ref <150)

## 2020-01-10 LAB — HEMOGLOBIN A1C
Hgb A1c MFr Bld: 10.5 % of total Hgb — ABNORMAL HIGH (ref <5.7)
Mean Plasma Glucose: 255 (calc)
eAG (mmol/L): 14.1 (calc)

## 2020-01-10 NOTE — Telephone Encounter (Signed)
Message sent under result notes for [name] to address:  Spoke with patient today. Notes she did not take her insulin yesterday. She did check her sugar while on the phone with me and states it is now 508. When asked if she had taken her insulin today she again has said no to taking it today. Stressed the importance of her taking her insulin as prescribed. States she would take it once we got of the phone. Please advise regarding if anything else needs to be done.

## 2020-01-10 NOTE — Telephone Encounter (Signed)
[facility] report - States urgent labs. Called on call doctor.

## 2020-01-10 NOTE — Telephone Encounter (Signed)
Received phone call from the answering service at ~ 3 AM in the morning, CBG drawn few hours before was 605, I know the answering service attempted to call the patient at least twice with instructions, not sure if they were able to reach the patient. Forwarding message to PCP for further advice.

## 2020-01-10 NOTE — Telephone Encounter (Signed)
Attempted to reach patient today but unable to get her. Left message for her to. return call to clinic to discuss labs. [name] had addressed patient's lab under result notes also

## 2020-01-18 ENCOUNTER — Other Ambulatory Visit (INDEPENDENT_AMBULATORY_CARE_PROVIDER_SITE_OTHER): Payer: BC Managed Care – PPO

## 2020-01-18 ENCOUNTER — Other Ambulatory Visit: Payer: Self-pay | Admitting: Endocrinology

## 2020-01-18 ENCOUNTER — Other Ambulatory Visit: Payer: Self-pay

## 2020-01-18 DIAGNOSIS — E1165 Type 2 diabetes mellitus with hyperglycemia: Secondary | ICD-10-CM

## 2020-01-18 DIAGNOSIS — Z794 Long term (current) use of insulin: Secondary | ICD-10-CM

## 2020-01-18 LAB — BASIC METABOLIC PANEL
BUN: 18 mg/dL (ref 6–23)
CO2: 28 mEq/L (ref 19–32)
Calcium: 9.4 mg/dL (ref 8.4–10.5)
Chloride: 103 mEq/L (ref 96–112)
Creatinine, Ser: 1.07 mg/dL (ref 0.40–1.20)
GFR: 61.6 mL/min (ref >60.00)
Glucose, Bld: 344 mg/dL — ABNORMAL HIGH (ref 70–99)
Potassium: 3.8 mEq/L (ref 3.5–5.1)
Sodium: 141 mEq/L (ref 135–145)

## 2020-01-22 ENCOUNTER — Other Ambulatory Visit: Payer: Self-pay

## 2020-01-22 ENCOUNTER — Ambulatory Visit (INDEPENDENT_AMBULATORY_CARE_PROVIDER_SITE_OTHER): Payer: BC Managed Care – PPO | Admitting: Endocrinology

## 2020-01-22 VITALS — BP 130/82 | HR 82 | Ht 64.0 in | Wt 178.0 lb

## 2020-01-22 DIAGNOSIS — E1165 Type 2 diabetes mellitus with hyperglycemia: Secondary | ICD-10-CM

## 2020-01-22 DIAGNOSIS — Z794 Long term (current) use of insulin: Secondary | ICD-10-CM | POA: Diagnosis not present

## 2020-01-22 DIAGNOSIS — I10 Essential (primary) hypertension: Secondary | ICD-10-CM | POA: Diagnosis not present

## 2020-01-22 NOTE — Progress Notes (Signed)
Patient ID: Deborah Neal, female   DOB: 26-May-1950, 69 y.o.   MRN: [id number]          Reason for Appointment: Follow-up for Type 2 Diabetes  Referring physician: [name]   History of Present Illness:          Date of diagnosis of type 2 diabetes mellitus:        Background history: She was initially started on metformin but apparently since severely since blood sugar not controlled she was switched to insulin in 2018 She has taken various types of insulin in the past including premixed insulin with usually poor control Does not have a history of taking a GLP-1 drug or medication like Invokana Her blood sugars were under fair control until 6/18 when her  A1c+ went up to 9.2 and subsequently has been higher  Recent history:    INSULIN regimen:  Humulin R U-500, 30 units in the morning, 40 units acl and 30 dinner  Lantus 40 units in the evening  Non-insulin hypoglycemic drugs:    Her A1c is still significantly high at 10.5  Current management, blood sugar patterns and problems identified:  Blood sugars were relatively better on her last visit in June overall as judged by her fructosamine but now blood sugars are out of control  She was told to consider the V-go pump because of her forgetting her mealtime insulin  However although she is taking her Humulin R reportedly more regularly she is only taking 40 units of Lantus instead of 90  Did not bring her meter  Apparently her blood sugars at home are fairly consistently over 200 throughout the day  Blood sugar 344 in the lab after breakfast  She is also not consistent with diet with sometimes getting regular soft drinks when eating foods like chocolate rice crispies for breakfast  Appears to have lost some weight however She thinks she is taking her Synjardy regularly  She is doing some exercise bike at the gym periodically    Side effects from medications have been: None   Compliance with the medical regimen:  Fair  Glucose monitoring:  done 1-2 times a day         Glucometer: One Touch Ultra.       Blood Glucose readings as above, meter not available for download  Prior  PRE-MEAL Fasting Lunch Dinner Bedtime Overall  Glucose range:  143-238  58-291     Mean/median:  180  157   177   POST-MEAL PC Breakfast PC Lunch PC Dinner  Glucose range:   ?  Mean/median:        Typical meal intake: Breakfast is cereal, oatmeal or toast and boiled egg, lunch  or meat sandwich  Usually has meat and potatoes at dinner at 7 pm Snacks: Popcorn,  or fruit                Dietician visit, most recent: 6/19  Weight history:  Wt Readings from Last 3 Encounters:  01/22/20 178 lb (80.7 kg)  01/09/20 177 lb (80.3 kg)  09/25/19 186 lb 3.2 oz (84.5 kg)    Glycemic control:   Lab Results  Component Value Date   HGBA1C 10.5 (H) 01/09/2020   HGBA1C 10.3 (H) 08/20/2019   HGBA1C 7.7 (H) 05/17/2019   Lab Results  Component Value Date   MICROALBUR 10.0 (H) 08/20/2019   LDLCALC 50 01/09/2020   CREATININE 1.07 01/18/2020   Lab Results  Component Value Date  MICRALBCREAT 2.1 08/20/2019    Lab Results  Component Value Date   FRUCTOSAMINE 292 (H) 09/20/2019   FRUCTOSAMINE 277 02/12/2019   FRUCTOSAMINE 277 01/09/2018   Lab on 01/18/2020  Component Date Value Ref Range Status  . Sodium 01/18/2020 141  135 - 145 mEq/L Final  . Potassium 01/18/2020 3.8  3.5 - 5.1 mEq/L Final  . Chloride 01/18/2020 103  96 - 112 mEq/L Final  . CO2 01/18/2020 28  19 - 32 mEq/L Final  . Glucose, Bld 01/18/2020 344* 70 - 99 mg/dL Final  . BUN 01/18/2020 18  6 - 23 mg/dL Final  . Creatinine, Ser 01/18/2020 1.07  0.40 - 1.20 mg/dL Final  . GFR 01/18/2020 61.60  >60.00 mL/min Final  . Calcium 01/18/2020 9.4  8.4 - 10.5 mg/dL Final      Allergies as of 01/22/2020   No Known Allergies     Medication List       Accurate as of January 22, 2020  2:37 PM. If you have any questions, ask your nurse or doctor.         albuterol 108 (90 Base) MCG/ACT inhaler Commonly known as: ProAir HFA INHALE 2 PUFFS BY MOUTH EVERY 6 HOURS AS NEEDED FOR WHEEZING OR SHORTNESS OF BREATH   aspirin 81 MG tablet Take 81 mg by mouth every morning.   glucose blood test strip Commonly known as: ONE TOUCH ULTRA TEST Use to check blood sugars three times a day   OneTouch Verio test strip Generic drug: glucose blood USE AS DIRECTED THREE TIMES DAILY   HumuLIN R U-500 KwikPen 500 UNIT/ML kwikpen Generic drug: insulin regular human CONCENTRATED ADMINISTER 60 UNITS UNDER THE SKIN 30 MINUTES BEFORE MEALS   Lancets Misc Use as directed four times daily  E11.9   Lantus SoloStar 100 UNIT/ML Solostar Pen Generic drug: insulin glargine INJECT 90 UNITS INTO THE SKIN EVERY DAY SUBCUTANEOUSLY   losartan 50 MG tablet Commonly known as: COZAAR Take 1 tablet (50 mg total) by mouth daily.   multivitamin with minerals Tabs tablet Take 1 tablet by mouth daily.   ONE TOUCH ULTRA 2 w/Device Kit Use as directed   potassium chloride 10 MEQ tablet Commonly known as: KLOR-CON TAKE 3 TABLETS(30 MEQ) BY MOUTH DAILY   rosuvastatin 20 MG tablet Commonly known as: CRESTOR TAKE 1 TABLET(20 MG) BY MOUTH DAILY   Synjardy XR 08-998 MG Tb24 Generic drug: Empagliflozin-metFORMIN HCl ER Take 2 tablets by mouth daily.   tiZANidine 2 MG tablet Commonly known as: ZANAFLEX Take 1 tablet (2 mg total) by mouth every 6 (six) hours as needed for muscle spasms.       Allergies: No Known Allergies  Past Medical History:  Diagnosis Date  . ALLERGIC RHINITIS 01/29/2007  . Arthritis   . ASTHMA 01/29/2007  . ASTHMA, WITH ACUTE EXACERBATION 06/24/2008  . BACK PAIN 01/29/2009  . CHEST PAIN 06/24/2008  . Family history of adverse reaction to anesthesia    Patients daughter has N/V after anesthesia  . GERD (gastroesophageal reflux disease)   . History of shingles   . HYPERLIPIDEMIA 01/29/2007  . HYPERTENSION 01/29/2007  . OSTEOPENIA 10/16/2007   . Type II or unspecified type diabetes mellitus without mention of complication, uncontrolled 11/29/2013    Past Surgical History:  Procedure Laterality Date  . CHOLECYSTECTOMY N/A 09/19/2015   Procedure: LAPAROSCOPIC CHOLECYSTECTOMY WITH INTRAOPERATIVE CHOLANGIOGRAM;  Surgeon: Jackolyn Confer, MD;  Location: [facility];  Service: General;  Laterality: N/A;  . COLONOSCOPY    .  TONSILLECTOMY  1956  . VIDEO BRONCHOSCOPY Bilateral 03/28/2014   Procedure: VIDEO BRONCHOSCOPY WITHOUT FLUORO;  Surgeon: Tanda Rockers, MD;  Location: [facility];  Service: Cardiopulmonary;  Laterality: Bilateral;    Family History  Problem Relation Age of Onset  . Cancer Mother        ? type   . Diabetes Father   . Hypertension Father   . Heart disease Father   . Asthma Father   . Asthma Daughter   . Colon cancer Neg Hx   . Esophageal cancer Neg Hx   . Rectal cancer Neg Hx   . Stomach cancer Neg Hx   . Breast cancer Neg Hx     Social History:  reports that she quit smoking about 16 years ago. She has never used smokeless tobacco. She reports that she does not drink alcohol and does not use drugs.   Review of Systems   Lipid history: On Crestor 20 for lipid control, prescribed by  PCP    Lab Results  Component Value Date   CHOL 109 01/09/2020   HDL 36 (L) 01/09/2020   LDLCALC 50 01/09/2020   LDLDIRECT 153.4 01/29/2009   TRIG 149 01/09/2020   CHOLHDL 3.0 01/09/2020           Hypertension:On losartan, blood pressure is better with going back on Jardiance  BP Readings from Last 3 Encounters:  01/22/20 130/82  01/09/20 130/70  09/25/19 140/82   Lab Results  Component Value Date   CREATININE 1.07 01/18/2020   CREATININE 0.96 01/09/2020   CREATININE 1.00 09/20/2019    Most recent eye exam was in 04/2018  Most recent foot exam: 3/21  Abnormal TSH: She has had annual TSH tests for screening from her PCP Had transient increase in TSH in 6/20    Lab Results  Component Value Date   TSH  3.66 08/30/2019   TSH 3.79 02/12/2019   TSH 4.81 (H) 10/23/2018   FREET4 0.64 02/12/2019      LABS:  Lab on 01/18/2020  Component Date Value Ref Range Status  . Sodium 01/18/2020 141  135 - 145 mEq/L Final  . Potassium 01/18/2020 3.8  3.5 - 5.1 mEq/L Final  . Chloride 01/18/2020 103  96 - 112 mEq/L Final  . CO2 01/18/2020 28  19 - 32 mEq/L Final  . Glucose, Bld 01/18/2020 344* 70 - 99 mg/dL Final  . BUN 01/18/2020 18  6 - 23 mg/dL Final  . Creatinine, Ser 01/18/2020 1.07  0.40 - 1.20 mg/dL Final  . GFR 01/18/2020 61.60  >60.00 mL/min Final  . Calcium 01/18/2020 9.4  8.4 - 10.5 mg/dL Final    Physical Examination:  BP 130/82 (BP Location: Left Arm, Patient Position: Sitting, Cuff Size: Normal)   Pulse 82   Ht '5\' 4"'  (1.626 m)   Wt 178 lb (80.7 kg)   SpO2 96%   BMI 30.55 kg/m         ASSESSMENT:  Diabetes type 2, with obesity on insulin  See history of present illness for detailed discussion of current diabetes management, blood sugar patterns and problems identified  Her A1c is still over 10%  Likely her sugars are higher from taking an adequate basal insulin along with an adequate diet regimen She is not motivated to check her sugars nightly and get consistent control Discussed insulin deficiency and need for adequate insulin to cover both basal and bolus needs  Explained to her the level of A1c and the equivalent average  blood sugar that she has is also need to improve it at least 2 to 3%  Hypertension: Blood pressure is more consistently controlled now   PLAN:  She was again explained the use of the V-go pump which would be the simplest option for her to improve her control, reduce insulin requirement and improve compliance She appears interested in this compared to multiple injections and also will reduce the need for 2 different types of insulin Discussed needing to check her blood sugars at various times by rotation and blood sugar targets at those  times Again discussed improving her diet with cutting out regular soft drinks, eating more healthy meals especially at breakfast with less cereal If she is cutting back on carbohydrates especially at breakfast she can reduce her Humulin R by 5 to 10 units Otherwise if she is eating a larger meal or going out in the evening she can go up to 40 units for dinnertime coverage Needs to go back up to at least 80 units of LANTUS and discussed that this will be adjusted based on fasting readings Need short-term follow-up to make sure she is compliant Also continue Synjardy as before Also consider using Dexcom on freestyle libre since this will enable better monitoring blood she will likely only handle learning 1 device at a time   Patient Instructions  Lantus 80 units daily  Check blood sugars on waking up 4  days a week  Also check blood sugars about 2 hours after meals and do this after different meals by rotation  Recommended blood sugar levels on waking up are 90-130 and about 2 hours after meal is 130-180  Please bring your blood sugar monitor to each visit, thank you  Adjust U-500 shot based on type of meal            Elayne Snare 01/22/2020, 2:37 PM   Note: This office note was prepared with Dragon voice recognition system technology. Any transcriptional errors that result from this process are unintentional.

## 2020-01-22 NOTE — Patient Instructions (Addendum)
Lantus 80 units daily  Check blood sugars on waking up 4  days a week  Also check blood sugars about 2 hours after meals and do this after different meals by rotation  Recommended blood sugar levels on waking up are 90-130 and about 2 hours after meal is 130-180  Please bring your blood sugar monitor to each visit, thank you  Adjust U-500 shot based on type of meal

## 2020-01-24 ENCOUNTER — Other Ambulatory Visit: Payer: Self-pay | Admitting: Internal Medicine

## 2020-01-24 NOTE — Telephone Encounter (Signed)
Please refill as per office routine med refill policy (all routine meds refilled for 3 mo or monthly per pt preference up to one year from last visit, then month to month grace period for 3 mo, then further med refills will have to be denied)  

## 2020-01-28 ENCOUNTER — Other Ambulatory Visit: Payer: Self-pay | Admitting: Internal Medicine

## 2020-01-28 NOTE — Telephone Encounter (Signed)
Please refill as per office routine med refill policy (all routine meds refilled for 3 mo or monthly per pt preference up to one year from last visit, then month to month grace period for 3 mo, then further med refills will have to be denied)  

## 2020-01-30 ENCOUNTER — Other Ambulatory Visit: Payer: Self-pay | Admitting: Internal Medicine

## 2020-01-30 ENCOUNTER — Other Ambulatory Visit: Payer: Self-pay | Admitting: Endocrinology

## 2020-02-18 ENCOUNTER — Other Ambulatory Visit: Payer: Self-pay | Admitting: Internal Medicine

## 2020-02-18 DIAGNOSIS — Z0000 Encounter for general adult medical examination without abnormal findings: Secondary | ICD-10-CM

## 2020-02-26 ENCOUNTER — Other Ambulatory Visit: Payer: Self-pay | Admitting: Internal Medicine

## 2020-02-29 ENCOUNTER — Other Ambulatory Visit: Payer: Self-pay | Admitting: Endocrinology

## 2020-02-29 ENCOUNTER — Other Ambulatory Visit: Payer: Self-pay

## 2020-02-29 ENCOUNTER — Other Ambulatory Visit (INDEPENDENT_AMBULATORY_CARE_PROVIDER_SITE_OTHER): Payer: BC Managed Care – PPO

## 2020-02-29 DIAGNOSIS — E1165 Type 2 diabetes mellitus with hyperglycemia: Secondary | ICD-10-CM | POA: Diagnosis not present

## 2020-02-29 DIAGNOSIS — Z794 Long term (current) use of insulin: Secondary | ICD-10-CM

## 2020-02-29 LAB — BASIC METABOLIC PANEL
BUN: 18 mg/dL (ref 6–23)
CO2: 29 mEq/L (ref 19–32)
Calcium: 9.4 mg/dL (ref 8.4–10.5)
Chloride: 101 mEq/L (ref 96–112)
Creatinine, Ser: 1.05 mg/dL (ref 0.40–1.20)
GFR: 54.53 mL/min — ABNORMAL LOW (ref >60.00)
Glucose, Bld: 343 mg/dL — ABNORMAL HIGH (ref 70–99)
Potassium: 4 mEq/L (ref 3.5–5.1)
Sodium: 138 mEq/L (ref 135–145)

## 2020-03-01 LAB — FRUCTOSAMINE: Fructosamine: 443 umol/L — ABNORMAL HIGH (ref 0–285)

## 2020-03-04 ENCOUNTER — Ambulatory Visit (INDEPENDENT_AMBULATORY_CARE_PROVIDER_SITE_OTHER): Payer: BC Managed Care – PPO | Admitting: Endocrinology

## 2020-03-04 ENCOUNTER — Encounter: Payer: Self-pay | Admitting: Endocrinology

## 2020-03-04 ENCOUNTER — Other Ambulatory Visit: Payer: Self-pay

## 2020-03-04 VITALS — BP 160/100 | Temp 99.0°F | Ht 64.0 in | Wt 183.6 lb

## 2020-03-04 DIAGNOSIS — Z794 Long term (current) use of insulin: Secondary | ICD-10-CM | POA: Diagnosis not present

## 2020-03-04 DIAGNOSIS — E1165 Type 2 diabetes mellitus with hyperglycemia: Secondary | ICD-10-CM

## 2020-03-04 DIAGNOSIS — I10 Essential (primary) hypertension: Secondary | ICD-10-CM | POA: Diagnosis not present

## 2020-03-04 MED ORDER — TOUJEO MAX SOLOSTAR 300 UNIT/ML ~~LOC~~ SOPN: 100.0000 [IU] | PEN_INJECTOR | Freq: Every day | SUBCUTANEOUS | 2 refills | Status: DC

## 2020-03-04 NOTE — Patient Instructions (Addendum)
BLUE Humulin R U-500, 40 units in the morning, 40 units LUNCH and 60 BEFORE dinner   Lantus 90 units in the evening, CHANGE TO TOUJEO 100 UNITS DAIL IN PM  Check blood sugars on waking up 3-4  days a week  Also check blood sugars about 2 hours after meals and do this after different meals by rotation  Recommended blood sugar levels on waking up are 90-130 and about 2 hours after meal is 130-180  Please bring your blood sugar monitor to each visit, thank you

## 2020-03-04 NOTE — Progress Notes (Signed)
Patient ID: Deborah Neal, female   DOB: December 17, 1950, 69 y.o.   MRN: [id number]          Reason for Appointment: Follow-up for Type 2 Diabetes  Referring physician: [name]   History of Present Illness:          Date of diagnosis of type 2 diabetes mellitus:        Background history: She was initially started on metformin but apparently since severely since blood sugar not controlled she was switched to insulin in 2018 She has taken various types of insulin in the past including premixed insulin with usually poor control Does not have a history of taking a GLP-1 drug or medication like Invokana Her blood sugars were under fair control until 6/18 when her  A1c+ went up to 9.2 and subsequently has been higher  Recent history:    INSULIN regimen:  Humulin R U-500, 30 units in the morning, 0 units acl and 30 dinner  Lantus 80-90 units in the evening  Non-insulin hypoglycemic drugs: Synjardy XR 08/998, 2 tablets daily  Her A1c is last significantly high at 10.5 Fructosamine is high at 443  Current management, blood sugar patterns and problems identified:  She is still quite confused about how she needs to take her insulin and appears to be taking laboratory doses  Also not clear if she takes her insulin doses regularly  She is likely taking only 80 units of Lantus instead of 90 since the pen only has 80 units dosage  Previously was taking smaller dose of Lantus  However not clear why her FASTING blood sugars are periodically over 500 even in the morning  She thinks she is taking her Humulin R twice a day but may not be taking it in the evening consistently  Also she does not take the 40 units for larger meals She has only a couple of readings later in the day and toes are mostly high also  She is doing some exercise bike at the gym periodically  Side effects from medications have been: None   Compliance with the medical regimen: Usually poor  Glucose monitoring:  done  1-2 times a day         Glucometer: One Touch Ultra.       Blood Glucose readings  PRE-MEAL Fasting Lunch Dinner Bedtime Overall  Glucose range:  140-600+  354, 375  120    Mean/median:  338     335   POST-MEAL PC Breakfast PC Lunch PC Dinner  Glucose range:    435, 429  Mean/median:        Typical meal intake: Breakfast is cereal, oatmeal or toast and boiled egg, lunch  or meat sandwich  Usually has meat and potatoes at dinner at 7 pm Snacks: Popcorn,  or fruit                Dietician visit, most recent: 6/19  Weight history:  Wt Readings from Last 3 Encounters:  03/04/20 183 lb 9.6 oz (83.3 kg)  01/22/20 178 lb (80.7 kg)  01/09/20 177 lb (80.3 kg)    Glycemic control:   Lab Results  Component Value Date   HGBA1C 10.5 (H) 01/09/2020   HGBA1C 10.3 (H) 08/20/2019   HGBA1C 7.7 (H) 05/17/2019   Lab Results  Component Value Date   MICROALBUR 10.0 (H) 08/20/2019   LDLCALC 50 01/09/2020   CREATININE 1.05 02/29/2020   Lab Results  Component Value Date   MICRALBCREAT 2.1  08/20/2019    Lab Results  Component Value Date   FRUCTOSAMINE 443 (H) 02/29/2020   FRUCTOSAMINE 292 (H) 09/20/2019   FRUCTOSAMINE 277 02/12/2019   Lab on 02/29/2020  Component Date Value Ref Range Status  . Fructosamine 02/29/2020 443* 0 - 285 umol/L Final   Comment: Published reference interval for apparently healthy subjects between age 77 and 27 is 25 - 285 umol/L and in a poorly controlled diabetic population is 228 - 563 umol/L with a mean of 396 umol/L.   Marland Kitchen Sodium 02/29/2020 138  135 - 145 mEq/L Final  . Potassium 02/29/2020 4.0  3.5 - 5.1 mEq/L Final  . Chloride 02/29/2020 101  96 - 112 mEq/L Final  . CO2 02/29/2020 29  19 - 32 mEq/L Final  . Glucose, Bld 02/29/2020 343* 70 - 99 mg/dL Final  . BUN 02/29/2020 18  6 - 23 mg/dL Final  . Creatinine, Ser 02/29/2020 1.05  0.40 - 1.20 mg/dL Final  . GFR 02/29/2020 54.53* >60.00 mL/min Final   Calculated using the CKD-EPI Creatinine  Equation (2021)  . Calcium 02/29/2020 9.4  8.4 - 10.5 mg/dL Final      Allergies as of 03/04/2020   No Known Allergies     Medication List       Accurate as of March 04, 2020 12:16 PM. If you have any questions, ask your nurse or doctor.        STOP taking these medications   Lantus SoloStar 100 UNIT/ML Solostar Pen Generic drug: insulin glargine Replaced by: Toujeo Max SoloStar 300 UNIT/ML Solostar Pen Stopped by: Elayne Snare, MD     TAKE these medications   albuterol 108 (90 Base) MCG/ACT inhaler Commonly known as: ProAir HFA INHALE 2 PUFFS BY MOUTH EVERY 6 HOURS AS NEEDED FOR WHEEZING OR SHORTNESS OF BREATH   aspirin 81 MG tablet Take 81 mg by mouth every morning.   HumuLIN R U-500 KwikPen 500 UNIT/ML kwikpen Generic drug: insulin regular human CONCENTRATED ADMINISTER 60 UNITS UNDER THE SKIN 30 MINUTES BEFORE MEALS   Lancets Misc Use as directed four times daily  E11.9   losartan 50 MG tablet Commonly known as: COZAAR Take 1 tablet (50 mg total) by mouth daily.   multivitamin with minerals Tabs tablet Take 1 tablet by mouth daily.   ONE TOUCH ULTRA 2 w/Device Kit Use as directed   OneTouch Verio test strip Generic drug: glucose blood USE AS DIRECTED THREE TIMES DAILY   OneTouch Ultra test strip Generic drug: glucose blood USE TO CHECK BLOOD SUGARS THREE TIMES DAILY   potassium chloride 10 MEQ tablet Commonly known as: KLOR-CON TAKE 3 TABLETS(30 MEQ) BY MOUTH DAILY   rosuvastatin 20 MG tablet Commonly known as: CRESTOR TAKE 1 TABLET(20 MG) BY MOUTH DAILY   Synjardy XR 08-998 MG Tb24 Generic drug: Empagliflozin-metFORMIN HCl ER Take 2 tablets by mouth daily.   tiZANidine 2 MG tablet Commonly known as: ZANAFLEX TAKE 1 TABLET(2 MG) BY MOUTH EVERY 6 HOURS AS NEEDED FOR MUSCLE SPASMS   Toujeo Max SoloStar 300 UNIT/ML Solostar Pen Generic drug: insulin glargine (2 Unit Dial) Inject 100 Units into the skin daily before supper. Replaces: Lantus  SoloStar 100 UNIT/ML Solostar Pen Started by: Elayne Snare, MD       Allergies: No Known Allergies  Past Medical History:  Diagnosis Date  . ALLERGIC RHINITIS 01/29/2007  . Arthritis   . ASTHMA 01/29/2007  . ASTHMA, WITH ACUTE EXACERBATION 06/24/2008  . BACK PAIN 01/29/2009  . CHEST PAIN  06/24/2008  . Family history of adverse reaction to anesthesia    Patients daughter has N/V after anesthesia  . GERD (gastroesophageal reflux disease)   . History of shingles   . HYPERLIPIDEMIA 01/29/2007  . HYPERTENSION 01/29/2007  . OSTEOPENIA 10/16/2007  . Type II or unspecified type diabetes mellitus without mention of complication, uncontrolled 11/29/2013    Past Surgical History:  Procedure Laterality Date  . CHOLECYSTECTOMY N/A 09/19/2015   Procedure: LAPAROSCOPIC CHOLECYSTECTOMY WITH INTRAOPERATIVE CHOLANGIOGRAM;  Surgeon: Jackolyn Confer, MD;  Location: [facility];  Service: General;  Laterality: N/A;  . COLONOSCOPY    . TONSILLECTOMY  1956  . VIDEO BRONCHOSCOPY Bilateral 03/28/2014   Procedure: VIDEO BRONCHOSCOPY WITHOUT FLUORO;  Surgeon: Tanda Rockers, MD;  Location: [facility];  Service: Cardiopulmonary;  Laterality: Bilateral;    Family History  Problem Relation Age of Onset  . Cancer Mother        ? type   . Diabetes Father   . Hypertension Father   . Heart disease Father   . Asthma Father   . Asthma Daughter   . Colon cancer Neg Hx   . Esophageal cancer Neg Hx   . Rectal cancer Neg Hx   . Stomach cancer Neg Hx   . Breast cancer Neg Hx     Social History:  reports that she quit smoking about 16 years ago. She has never used smokeless tobacco. She reports that she does not drink alcohol and does not use drugs.   Review of Systems   Lipid history: On Crestor 20 for lipid control, prescribed by  PCP    Lab Results  Component Value Date   CHOL 109 01/09/2020   HDL 36 (L) 01/09/2020   LDLCALC 50 01/09/2020   LDLDIRECT 153.4 01/29/2009   TRIG 149 01/09/2020   CHOLHDL 3.0  01/09/2020           Hypertension:On losartan, blood pressure is variable  BP Readings from Last 3 Encounters:  03/04/20 (!) 160/100  01/22/20 130/82  01/09/20 130/70   Lab Results  Component Value Date   CREATININE 1.05 02/29/2020   CREATININE 1.07 01/18/2020   CREATININE 0.96 01/09/2020    Most recent eye exam was in 04/2018  Most recent foot exam: 3/21  Abnormal TSH: She has had annual TSH tests for screening from her PCP Had transient increase in TSH in 6/20    Lab Results  Component Value Date   TSH 3.66 08/30/2019   TSH 3.79 02/12/2019   TSH 4.81 (H) 10/23/2018   FREET4 0.64 02/12/2019      LABS:  Lab on 02/29/2020  Component Date Value Ref Range Status  . Fructosamine 02/29/2020 443* 0 - 285 umol/L Final   Comment: Published reference interval for apparently healthy subjects between age 34 and 87 is 7 - 285 umol/L and in a poorly controlled diabetic population is 228 - 563 umol/L with a mean of 396 umol/L.   Marland Kitchen Sodium 02/29/2020 138  135 - 145 mEq/L Final  . Potassium 02/29/2020 4.0  3.5 - 5.1 mEq/L Final  . Chloride 02/29/2020 101  96 - 112 mEq/L Final  . CO2 02/29/2020 29  19 - 32 mEq/L Final  . Glucose, Bld 02/29/2020 343* 70 - 99 mg/dL Final  . BUN 02/29/2020 18  6 - 23 mg/dL Final  . Creatinine, Ser 02/29/2020 1.05  0.40 - 1.20 mg/dL Final  . GFR 02/29/2020 54.53* >60.00 mL/min Final   Calculated using the CKD-EPI Creatinine Equation (2021)  .  Calcium 02/29/2020 9.4  8.4 - 10.5 mg/dL Final    Physical Examination:  BP (!) 160/100   Temp 99 F (37.2 C)   Ht '5\' 4"'  (1.626 m)   Wt 183 lb 9.6 oz (83.3 kg)   SpO2 97%   BMI 31.51 kg/m         ASSESSMENT:  Diabetes type 2, with obesity on insulin  See history of present illness for detailed discussion of current diabetes management, blood sugar patterns and problems identified  Her A1c is last over 10%  She was previously having relatively better blood sugars but now her home readings  are averaging well over 300 even in the morning Quite likely she is not taking her insulin doses consistently since her morning sugars fluctuate between 140 and 600 Also not clear if she is taking her Synjardy regularly  She was interested in insulin pump but she did not pursue this or connect with the V-go company Also is still insulin resistant and likely requiring over 100 units of insulin at least  Hypertension: Blood pressure is inconsistently controlled, followed by PCP   PLAN:  She was again explained the dosing regimen for her insulin and need to take both the types of insulin regularly Since she is not likely taking more than 80 units of Lantus she will switch to Toujeo and start with 100 units daily This should improve her fasting reading She will also go up to twice a much Humulin R at dinnertime and take 60 units along with 40 units in the morning and 40 at lunch She needs to take this consistently regardless of her blood sugar reading or whether she has checked her glucose or not  She will need to check her readings after supper more regularly Discussed blood sugar targets fasting and after meals Consistent diet She will be discussing the V-go pump with the nurse educator and needs to make sure that this is ordered   Patient Instructions  BLUE Humulin R U-500, 40 units in the morning, 40 units LUNCH and 60 BEFORE dinner   Lantus 90 units in the evening, CHANGE TO TOUJEO 100 UNITS DAIL IN PM  Check blood sugars on waking up 3-4  days a week  Also check blood sugars about 2 hours after meals and do this after different meals by rotation  Recommended blood sugar levels on waking up are 90-130 and about 2 hours after meal is 130-180  Please bring your blood sugar monitor to each visit, thank you               Elayne Snare 03/04/2020, 12:16 PM   Note: This office note was prepared with Dragon voice recognition system technology. Any transcriptional errors that  result from this process are unintentional.

## 2020-03-26 ENCOUNTER — Ambulatory Visit
Admission: RE | Admit: 2020-03-26 | Discharge: 2020-03-26 | Disposition: A | Discharge status: Home / Self Care | Payer: BC Managed Care – PPO | Source: Ambulatory Visit | Attending: Internal Medicine | Admitting: Internal Medicine

## 2020-03-26 ENCOUNTER — Other Ambulatory Visit: Payer: Self-pay

## 2020-03-26 ENCOUNTER — Ambulatory Visit: Payer: BC Managed Care – PPO

## 2020-03-26 DIAGNOSIS — Z1231 Encounter for screening mammogram for malignant neoplasm of breast: Secondary | ICD-10-CM | POA: Diagnosis not present

## 2020-03-26 DIAGNOSIS — Z0000 Encounter for general adult medical examination without abnormal findings: Secondary | ICD-10-CM

## 2020-04-14 ENCOUNTER — Other Ambulatory Visit (INDEPENDENT_AMBULATORY_CARE_PROVIDER_SITE_OTHER): Payer: BC Managed Care – PPO

## 2020-04-14 ENCOUNTER — Other Ambulatory Visit: Payer: Self-pay

## 2020-04-14 DIAGNOSIS — Z794 Long term (current) use of insulin: Secondary | ICD-10-CM | POA: Diagnosis not present

## 2020-04-14 DIAGNOSIS — E1165 Type 2 diabetes mellitus with hyperglycemia: Secondary | ICD-10-CM

## 2020-04-14 LAB — BASIC METABOLIC PANEL
BUN: 21 mg/dL (ref 6–23)
CO2: 28 mEq/L (ref 19–32)
Calcium: 9.6 mg/dL (ref 8.4–10.5)
Chloride: 106 mEq/L (ref 96–112)
Creatinine, Ser: 1.17 mg/dL (ref 0.40–1.20)
GFR: 47.85 mL/min — ABNORMAL LOW (ref >60.00)
Glucose, Bld: 149 mg/dL — ABNORMAL HIGH (ref 70–99)
Potassium: 3.8 mEq/L (ref 3.5–5.1)
Sodium: 143 mEq/L (ref 135–145)

## 2020-04-14 LAB — HEMOGLOBIN A1C: Hgb A1c MFr Bld: 10.8 % — ABNORMAL HIGH (ref 4.6–6.5)

## 2020-04-17 ENCOUNTER — Other Ambulatory Visit: Payer: Self-pay

## 2020-04-17 ENCOUNTER — Ambulatory Visit (INDEPENDENT_AMBULATORY_CARE_PROVIDER_SITE_OTHER): Payer: BC Managed Care – PPO | Admitting: Endocrinology

## 2020-04-17 ENCOUNTER — Encounter: Payer: Self-pay | Admitting: Endocrinology

## 2020-04-17 VITALS — BP 130/70 | HR 89 | Ht 64.0 in | Wt 187.4 lb

## 2020-04-17 DIAGNOSIS — I10 Essential (primary) hypertension: Secondary | ICD-10-CM | POA: Diagnosis not present

## 2020-04-17 DIAGNOSIS — E1165 Type 2 diabetes mellitus with hyperglycemia: Secondary | ICD-10-CM

## 2020-04-17 DIAGNOSIS — Z794 Long term (current) use of insulin: Secondary | ICD-10-CM | POA: Diagnosis not present

## 2020-04-17 NOTE — Progress Notes (Signed)
Patient ID: Deborah Neal, female   DOB: 26-Dec-1950, 69 y.o.   MRN: [id number]          Reason for Appointment: Follow-up for Type 2 Diabetes  Referring physician: [name]   History of Present Illness:          Date of diagnosis of type 2 diabetes mellitus:        Background history: She was initially started on metformin but apparently since severely since blood sugar not controlled she was switched to insulin in 2018 She has taken various types of insulin in the past including premixed insulin with usually poor control Does not have a history of taking a GLP-1 drug or medication like Invokana Her blood sugars were under fair control until 6/18 when her  A1c+ went up to 9.2 and subsequently has been higher  Recent history:    INSULIN regimen:  Humulin R U-500, 30 units in the morning, 30 at lunch and 60 at dinner Toujeo 100 units in the morning   Non-insulin hypoglycemic drugs: Synjardy XR 08/998, 2 tablets daily  Her A1c is last significantly high at 10.5 Fructosamine is high at 443  Current management, blood sugar patterns and problems identified:  She was told to take 40 units of the regular insulin at breakfast and lunch but is only taking 30  However with taking TOUJEO instead of Lantus she appears to have overall better fasting readings, previously averaging about 340  Despite reminders she forgets to check her readings after meals and only occasionally will check them midday and only once at dinnertime  she now thinks that occasionally she will feel a little hypoglycemic either in the afternoon or during the night but will not check her sugar at that time  FASTING readings are variable although usually has 2-3 good readings each week  She admits that she may be getting too many snacks late at night and occasional drinking regular soft drinks or Kool-Aid  Has only one reading before dinnertime of 300+  This is despite her saying that she is usually not eating  more than a snack at lunchtime now  She is doing some exercise bike at the gym periodically  However appears to be gradually gaining weight  She thinks she is taking her Synjardy regularly  Side effects from medications have been: None   Compliance with the medical regimen: Usually poor  Glucose monitoring:  done 1-2 times a day         Glucometer: One Touch Ultra.       Blood Glucose readings   PRE-MEAL  morning Lunch Dinner Bedtime Overall  Glucose range:  74-364  206-317  302 ?   Mean/median:  199  248   202   Previously:  PRE-MEAL Fasting Lunch Dinner Bedtime Overall  Glucose range:  140-600+  354, 375  120    Mean/median:  338     335   POST-MEAL PC Breakfast PC Lunch PC Dinner  Glucose range:    435, 429  Mean/median:        Typical meal intake: Breakfast is cereal, oatmeal or toast and boiled egg, lunch  or meat sandwich  Usually has meat and potatoes at dinner at 7 pm Snacks: Popcorn,  or fruit                Dietician visit, most recent: 6/19  Weight history:  Wt Readings from Last 3 Encounters:  04/17/20 187 lb 6.4 oz (85 kg)  03/04/20  183 lb 9.6 oz (83.3 kg)  01/22/20 178 lb (80.7 kg)    Glycemic control:   Lab Results  Component Value Date   HGBA1C 10.8 (H) 04/14/2020   HGBA1C 10.5 (H) 01/09/2020   HGBA1C 10.3 (H) 08/20/2019   Lab Results  Component Value Date   MICROALBUR 10.0 (H) 08/20/2019   LDLCALC 50 01/09/2020   CREATININE 1.17 04/14/2020   Lab Results  Component Value Date   MICRALBCREAT 2.1 08/20/2019    Lab Results  Component Value Date   FRUCTOSAMINE 443 (H) 02/29/2020   FRUCTOSAMINE 292 (H) 09/20/2019   FRUCTOSAMINE 277 02/12/2019   Lab on 04/14/2020  Component Date Value Ref Range Status  . Sodium 04/14/2020 143  135 - 145 mEq/L Final  . Potassium 04/14/2020 3.8  3.5 - 5.1 mEq/L Final  . Chloride 04/14/2020 106  96 - 112 mEq/L Final  . CO2 04/14/2020 28  19 - 32 mEq/L Final  . Glucose, Bld 04/14/2020 149* 70 - 99  mg/dL Final  . BUN 04/14/2020 21  6 - 23 mg/dL Final  . Creatinine, Ser 04/14/2020 1.17  0.40 - 1.20 mg/dL Final  . GFR 04/14/2020 47.85* >60.00 mL/min Final   Calculated using the CKD-EPI Creatinine Equation (2021)  . Calcium 04/14/2020 9.6  8.4 - 10.5 mg/dL Final  . Hgb A1c MFr Bld 04/14/2020 10.8* 4.6 - 6.5 % Final   Glycemic Control Guidelines for People with Diabetes:Non Diabetic:  <6%Goal of Therapy: <7%Additional Action Suggested:  >8%       Allergies as of 04/17/2020   No Known Allergies     Medication List       Accurate as of April 17, 2020 12:00 PM. If you have any questions, ask your nurse or doctor.        albuterol 108 (90 Base) MCG/ACT inhaler Commonly known as: ProAir HFA INHALE 2 PUFFS BY MOUTH EVERY 6 HOURS AS NEEDED FOR WHEEZING OR SHORTNESS OF BREATH   aspirin 81 MG tablet Take 81 mg by mouth every morning.   benazepril 20 MG tablet Commonly known as: LOTENSIN Take 20 mg by mouth daily.   HumuLIN R U-500 KwikPen 500 UNIT/ML kwikpen Generic drug: insulin regular human CONCENTRATED ADMINISTER 60 UNITS UNDER THE SKIN 30 MINUTES BEFORE MEALS   Lancets Misc Use as directed four times daily  E11.9   losartan 50 MG tablet Commonly known as: COZAAR Take 1 tablet (50 mg total) by mouth daily.   multivitamin with minerals Tabs tablet Take 1 tablet by mouth daily.   ONE TOUCH ULTRA 2 w/Device Kit Use as directed   OneTouch Verio test strip Generic drug: glucose blood USE AS DIRECTED THREE TIMES DAILY   OneTouch Ultra test strip Generic drug: glucose blood USE TO CHECK BLOOD SUGARS THREE TIMES DAILY   potassium chloride 10 MEQ tablet Commonly known as: KLOR-CON TAKE 3 TABLETS(30 MEQ) BY MOUTH DAILY   rosuvastatin 20 MG tablet Commonly known as: CRESTOR TAKE 1 TABLET(20 MG) BY MOUTH DAILY   Synjardy XR 08-998 MG Tb24 Generic drug: Empagliflozin-metFORMIN HCl ER Take 2 tablets by mouth daily.   tiZANidine 2 MG tablet Commonly known as:  ZANAFLEX TAKE 1 TABLET(2 MG) BY MOUTH EVERY 6 HOURS AS NEEDED FOR MUSCLE SPASMS   Toujeo Max SoloStar 300 UNIT/ML Solostar Pen Generic drug: insulin glargine (2 Unit Dial) Inject 100 Units into the skin daily before supper.       Allergies: No Known Allergies  Past Medical History:  Diagnosis Date  .  ALLERGIC RHINITIS 01/29/2007  . Arthritis   . ASTHMA 01/29/2007  . ASTHMA, WITH ACUTE EXACERBATION 06/24/2008  . BACK PAIN 01/29/2009  . CHEST PAIN 06/24/2008  . Family history of adverse reaction to anesthesia    Patients daughter has N/V after anesthesia  . GERD (gastroesophageal reflux disease)   . History of shingles   . HYPERLIPIDEMIA 01/29/2007  . HYPERTENSION 01/29/2007  . OSTEOPENIA 10/16/2007  . Type II or unspecified type diabetes mellitus without mention of complication, uncontrolled 11/29/2013    Past Surgical History:  Procedure Laterality Date  . CHOLECYSTECTOMY N/A 09/19/2015   Procedure: LAPAROSCOPIC CHOLECYSTECTOMY WITH INTRAOPERATIVE CHOLANGIOGRAM;  Surgeon: Jackolyn Confer, MD;  Location: [facility];  Service: General;  Laterality: N/A;  . COLONOSCOPY    . TONSILLECTOMY  1956  . VIDEO BRONCHOSCOPY Bilateral 03/28/2014   Procedure: VIDEO BRONCHOSCOPY WITHOUT FLUORO;  Surgeon: Tanda Rockers, MD;  Location: [facility];  Service: Cardiopulmonary;  Laterality: Bilateral;    Family History  Problem Relation Age of Onset  . Cancer Mother        ? type   . Diabetes Father   . Hypertension Father   . Heart disease Father   . Asthma Father   . Asthma Daughter   . Colon cancer Neg Hx   . Esophageal cancer Neg Hx   . Rectal cancer Neg Hx   . Stomach cancer Neg Hx   . Breast cancer Neg Hx     Social History:  reports that she quit smoking about 16 years ago. She has never used smokeless tobacco. She reports that she does not drink alcohol and does not use drugs.   Review of Systems   Lipid history: On Crestor 20 for lipid control, prescribed by  PCP    Lab Results   Component Value Date   CHOL 109 01/09/2020   HDL 36 (L) 01/09/2020   LDLCALC 50 01/09/2020   LDLDIRECT 153.4 01/29/2009   TRIG 149 01/09/2020   CHOLHDL 3.0 01/09/2020           Hypertension:On losartan but Lotensin is also listed, she does not know what medicine she is taking Appears that her blood pressure is variable  BP Readings from Last 3 Encounters:  04/17/20 130/70  03/04/20 (!) 160/100  01/22/20 130/82   Lab Results  Component Value Date   CREATININE 1.17 04/14/2020   CREATININE 1.05 02/29/2020   CREATININE 1.07 01/18/2020    Most recent eye exam was in 04/2018  Most recent foot exam: 3/21  Abnormal TSH: She has had annual TSH tests for screening from her PCP Had transient increase in TSH in 6/20    Lab Results  Component Value Date   TSH 3.66 08/30/2019   TSH 3.79 02/12/2019   TSH 4.81 (H) 10/23/2018   FREET4 0.64 02/12/2019      LABS:  Lab on 04/14/2020  Component Date Value Ref Range Status  . Sodium 04/14/2020 143  135 - 145 mEq/L Final  . Potassium 04/14/2020 3.8  3.5 - 5.1 mEq/L Final  . Chloride 04/14/2020 106  96 - 112 mEq/L Final  . CO2 04/14/2020 28  19 - 32 mEq/L Final  . Glucose, Bld 04/14/2020 149* 70 - 99 mg/dL Final  . BUN 04/14/2020 21  6 - 23 mg/dL Final  . Creatinine, Ser 04/14/2020 1.17  0.40 - 1.20 mg/dL Final  . GFR 04/14/2020 47.85* >60.00 mL/min Final   Calculated using the CKD-EPI Creatinine Equation (2021)  . Calcium 04/14/2020 9.6  8.4 - 10.5 mg/dL Final  . Hgb A1c MFr Bld 04/14/2020 10.8* 4.6 - 6.5 % Final   Glycemic Control Guidelines for People with Diabetes:Non Diabetic:  <6%Goal of Therapy: <7%Additional Action Suggested:  >8%     Physical Examination:  BP 130/70   Pulse 89   Ht 5' 4" (1.626 m)   Wt 187 lb 6.4 oz (85 kg)   SpO2 94%   BMI 32.17 kg/m         ASSESSMENT:  Diabetes type 2, with obesity on insulin  See history of present illness for detailed discussion of current diabetes management, blood  sugar patterns and problems identified  Her A1c is again over 10%  Her blood sugars are overall better on an average However she is only checking readings in the morning usually and these are quite variable With using Toujeo she is able to dial up to 100 units consistently and is doing better than before on her fasting readings compared to Lantus Not clear if she is getting enough insulin to cover her meals as she does not want it is but usually blood sugars after breakfast are generally higher Currently not interested in doing the V-go pump and this was discussed again She has gained some weight possibly from better blood sugar However hopefully she is taking her Synjardy regularly, difficult to confirm this  Hypertension: Blood pressure is inconsistently controlled, unclear what medication she is taking   PLAN:   She was instructed on insulin doses again and written information given on what she needs to take  For now since she is usually not eating much at lunchtime and her readings at midday are higher she will go up to 50 units of the regular insulin in the morning and also make sure she takes her insulin 30-minute before eating  Regardless of blood sugar she needs to take her insulin for meals twice a day  For convenience can do insulin twice a day for her regular insulin as she does not like needles  To avoid low sugars overnight she will reduce Toujeo down to 90  Also since she thinks sometimes she feels hypoglycemic at 10 PM she can reduce the regular insulin to 50 units  Needs to completely stop all regular drinks and switch to sugar-free  Avoid heavy snacking at night  Discussed need to check readings after dinner consistently  Consider using freestyle libre on the next visit although not clear if she will be able to understand how to use this especially with her lack of motivation to check her sugars multiple times a day  Repeat consultation with diabetes  educator/nutritionist  Discussed in detail the need for taking the Covid vaccine and the safety, reassured her that the [place] and Moderna vaccine do not cause blood clots  She will call back to let us know whether she is taking losartan or Lotensin  Follow-up in 3 months unless having unusually high or low sugars  Patient Instructions  Must check sugar in pm after 7 pm  TOUJEO 90 IN AM  HUMULIN R 50 IN AM AND 50 BEFORE DINNER  ONLY DIET drinks Diet Kool aid            Elayne Snare 04/17/2020, 12:00 PM   Note: This office note was prepared with Estate agent. Any transcriptional errors that result from this process are unintentional.

## 2020-04-17 NOTE — Patient Instructions (Signed)
Must check sugar in pm after 7 pm  TOUJEO 90 IN AM  HUMULIN R 50 IN AM AND 50 BEFORE DINNER  ONLY DIET drinks Diet Kool aid

## 2020-05-05 ENCOUNTER — Other Ambulatory Visit: Payer: Self-pay | Admitting: Internal Medicine

## 2020-05-05 NOTE — Telephone Encounter (Signed)
Please refill as per office routine med refill policy (all routine meds refilled for 3 mo or monthly per pt preference up to one year from last visit, then month to month grace period for 3 mo, then further med refills will have to be denied)  

## 2020-06-21 ENCOUNTER — Other Ambulatory Visit: Payer: Self-pay | Admitting: Endocrinology

## 2020-07-07 ENCOUNTER — Other Ambulatory Visit: Payer: Self-pay

## 2020-07-08 ENCOUNTER — Ambulatory Visit (INDEPENDENT_AMBULATORY_CARE_PROVIDER_SITE_OTHER): Payer: Medicare Other | Admitting: Internal Medicine

## 2020-07-08 ENCOUNTER — Encounter: Payer: Self-pay | Admitting: Internal Medicine

## 2020-07-08 VITALS — BP 138/80 | HR 93 | Temp 98.4°F | Ht 64.0 in | Wt 186.0 lb

## 2020-07-08 DIAGNOSIS — I10 Essential (primary) hypertension: Secondary | ICD-10-CM | POA: Diagnosis not present

## 2020-07-08 DIAGNOSIS — E559 Vitamin D deficiency, unspecified: Secondary | ICD-10-CM | POA: Diagnosis not present

## 2020-07-08 DIAGNOSIS — Z0000 Encounter for general adult medical examination without abnormal findings: Secondary | ICD-10-CM

## 2020-07-08 DIAGNOSIS — E538 Deficiency of other specified B group vitamins: Secondary | ICD-10-CM | POA: Diagnosis not present

## 2020-07-08 DIAGNOSIS — E119 Type 2 diabetes mellitus without complications: Secondary | ICD-10-CM | POA: Diagnosis not present

## 2020-07-08 DIAGNOSIS — E1165 Type 2 diabetes mellitus with hyperglycemia: Secondary | ICD-10-CM | POA: Diagnosis not present

## 2020-07-08 DIAGNOSIS — Z794 Long term (current) use of insulin: Secondary | ICD-10-CM | POA: Diagnosis not present

## 2020-07-08 DIAGNOSIS — E7800 Pure hypercholesterolemia, unspecified: Secondary | ICD-10-CM | POA: Diagnosis not present

## 2020-07-08 DIAGNOSIS — E785 Hyperlipidemia, unspecified: Secondary | ICD-10-CM | POA: Insufficient documentation

## 2020-07-08 LAB — HEPATIC FUNCTION PANEL
ALT: 26 U/L (ref 0–35)
AST: 24 U/L (ref 0–37)
Albumin: 4.2 g/dL (ref 3.5–5.2)
Alkaline Phosphatase: 82 U/L (ref 39–117)
Bilirubin, Direct: 0 mg/dL (ref 0.0–0.3)
Total Bilirubin: 0.2 mg/dL (ref 0.2–1.2)
Total Protein: 7.3 g/dL (ref 6.0–8.3)

## 2020-07-08 LAB — BASIC METABOLIC PANEL
BUN: 19 mg/dL (ref 6–23)
CO2: 28 mEq/L (ref 19–32)
Calcium: 9.4 mg/dL (ref 8.4–10.5)
Chloride: 105 mEq/L (ref 96–112)
Creatinine, Ser: 1.25 mg/dL — ABNORMAL HIGH (ref 0.40–1.20)
GFR: 44.12 mL/min — ABNORMAL LOW (ref >60.00)
Glucose, Bld: 115 mg/dL — ABNORMAL HIGH (ref 70–99)
Potassium: 3.4 mEq/L — ABNORMAL LOW (ref 3.5–5.1)
Sodium: 141 mEq/L (ref 135–145)

## 2020-07-08 LAB — CBC WITH DIFFERENTIAL/PLATELET
Basophils Absolute: 0 10*3/uL (ref 0.0–0.1)
Basophils Relative: 0.3 % (ref 0.0–3.0)
Eosinophils Absolute: 0.1 10*3/uL (ref 0.0–0.7)
Eosinophils Relative: 1.9 % (ref 0.0–5.0)
HCT: 37.9 % (ref 36.0–46.0)
Hemoglobin: 11.9 g/dL — ABNORMAL LOW (ref 12.0–15.0)
Lymphocytes Relative: 39.1 % (ref 12.0–46.0)
Lymphs Abs: 2.4 10*3/uL (ref 0.7–4.0)
MCHC: 31.5 g/dL (ref 30.0–36.0)
MCV: 73.8 fl — ABNORMAL LOW (ref 78.0–100.0)
Monocytes Absolute: 0.3 10*3/uL (ref 0.1–1.0)
Monocytes Relative: 5.8 % (ref 3.0–12.0)
Neutro Abs: 3.2 10*3/uL (ref 1.4–7.7)
Neutrophils Relative %: 52.9 % (ref 43.0–77.0)
Platelets: 248 10*3/uL (ref 150.0–400.0)
RBC: 5.14 Mil/uL — ABNORMAL HIGH (ref 3.87–5.11)
RDW: 16.6 % — ABNORMAL HIGH (ref 11.5–15.5)
WBC: 6 10*3/uL (ref 4.0–10.5)

## 2020-07-08 LAB — HEMOGLOBIN A1C: Hgb A1c MFr Bld: 7.4 % — ABNORMAL HIGH (ref 4.6–6.5)

## 2020-07-08 LAB — VITAMIN D 25 HYDROXY (VIT D DEFICIENCY, FRACTURES): VITD: 28.13 ng/mL — ABNORMAL LOW (ref 30.00–100.00)

## 2020-07-08 LAB — LIPID PANEL
Cholesterol: 230 mg/dL — ABNORMAL HIGH (ref 0–200)
HDL: 55.7 mg/dL (ref >39.00)
LDL Cholesterol: 145 mg/dL — ABNORMAL HIGH (ref 0–99)
NonHDL: 174.61
Total CHOL/HDL Ratio: 4
Triglycerides: 148 mg/dL (ref 0.0–149.0)
VLDL: 29.6 mg/dL (ref 0.0–40.0)

## 2020-07-08 LAB — TSH: TSH: 5.11 u[IU]/mL — ABNORMAL HIGH (ref 0.35–4.50)

## 2020-07-08 LAB — MICROALBUMIN / CREATININE URINE RATIO
Creatinine,U: 109.5 mg/dL
Microalb Creat Ratio: 1.3 mg/g (ref 0.0–30.0)
Microalb, Ur: 1.5 mg/dL (ref 0.0–1.9)

## 2020-07-08 LAB — VITAMIN B12: Vitamin B-12: 886 pg/mL (ref 211–911)

## 2020-07-08 NOTE — Progress Notes (Signed)
Patient ID: Deborah Neal, female   DOB: June 27, 1950, 70 y.o.   MRN: [id number]         Chief Complaint:: wellness exam and Follow-up DM, HTN, HLD        HPI:  Deborah Neal is a 70 y.o. female here for wellness exam; has optho appt later this wk.; declines covid shots or booster. O/w up to date with preventive referrals and immunizations   Pt denies chest pain, increased sob or doe, wheezing, orthopnea, PND, increased LE swelling, palpitations, dizziness or syncope.  Denies worsening focal neuro s/s.   Pt denies polydipsia, polyuria,  Pt denies fever, wt loss, night sweats, loss of appetite, or other constitutional symptoms   Wt Readings from Last 3 Encounters:  07/08/20 186 lb (84.4 kg)  04/17/20 187 lb 6.4 oz (85 kg)  03/04/20 183 lb 9.6 oz (83.3 kg)   BP Readings from Last 3 Encounters:  07/08/20 138/80  04/17/20 130/70  03/04/20 (!) 160/100   Immunization History  Administered Date(s) Administered  . Fluad Quad(high Dose 65+) 02/16/2019, 01/09/2020  . Influenza Split 03/02/2011  . Influenza Whole 01/29/2009  . Influenza, High Dose Seasonal PF 02/22/2017  . Influenza,inj,Quad PF,6+ Mos 05/30/2013, 05/15/2014, 07/14/2015  . Influenza-Unspecified 05/27/2016, 11/24/2017  . Pneumococcal Conjugate-13 12/13/2017  . Pneumococcal Polysaccharide-23 02/22/2017  . Td 01/29/2009  . Tdap 07/04/2019  . Zoster 09/19/2012   There are no preventive care reminders to display for this patient.  Past Medical History:  Diagnosis Date  . ALLERGIC RHINITIS 01/29/2007  . Arthritis   . ASTHMA 01/29/2007  . ASTHMA, WITH ACUTE EXACERBATION 06/24/2008  . BACK PAIN 01/29/2009  . CHEST PAIN 06/24/2008  . Family history of adverse reaction to anesthesia    Patients daughter has N/V after anesthesia  . GERD (gastroesophageal reflux disease)   . History of shingles   . HYPERLIPIDEMIA 01/29/2007  . HYPERTENSION 01/29/2007  . OSTEOPENIA 10/16/2007  . Type II or unspecified type diabetes mellitus without mention  of complication, uncontrolled 11/29/2013   Past Surgical History:  Procedure Laterality Date  . CHOLECYSTECTOMY N/A 09/19/2015   Procedure: LAPAROSCOPIC CHOLECYSTECTOMY WITH INTRAOPERATIVE CHOLANGIOGRAM;  Surgeon: Jackolyn Confer, MD;  Location: [facility];  Service: General;  Laterality: N/A;  . COLONOSCOPY    . TONSILLECTOMY  1956  . VIDEO BRONCHOSCOPY Bilateral 03/28/2014   Procedure: VIDEO BRONCHOSCOPY WITHOUT FLUORO;  Surgeon: Tanda Rockers, MD;  Location: [facility];  Service: Cardiopulmonary;  Laterality: Bilateral;    reports that she quit smoking about 16 years ago. She has never used smokeless tobacco. She reports that she does not drink alcohol and does not use drugs. family history includes Asthma in her daughter and father; Cancer in her mother; Diabetes in her father; Heart disease in her father; Hypertension in her father. No Known Allergies Current Outpatient Medications on File Prior to Visit  Medication Sig Dispense Refill  . albuterol (PROAIR HFA) 108 (90 Base) MCG/ACT inhaler INHALE 2 PUFFS BY MOUTH EVERY 6 HOURS AS NEEDED FOR WHEEZING OR SHORTNESS OF BREATH 8.5 g 5  . aspirin 81 MG tablet Take 81 mg by mouth every morning.    . benazepril (LOTENSIN) 20 MG tablet TAKE 1 TABLET(20 MG) BY MOUTH DAILY 90 tablet 0  . Blood Glucose Monitoring Suppl (ONE TOUCH ULTRA 2) w/Device KIT Use as directed 1 each 0  . Empagliflozin-metFORMIN HCl ER (SYNJARDY XR) 08-998 MG TB24 Take 2 tablets by mouth daily. 60 tablet 2  . [place]  500 UNIT/ML kwikpen ADMINISTER 60 UNITS UNDER THE SKIN 30 MINUTES BEFORE MEALS 6 mL 3  . Lancets MISC Use as directed four times daily  E11.9 400 each 11  . losartan (COZAAR) 50 MG tablet Take 1 tablet (50 mg total) by mouth daily. 90 tablet 3  . Multiple Vitamin (MULTIVITAMIN WITH MINERALS) TABS tablet Take 1 tablet by mouth daily.    Glory Rosebush ULTRA test strip USE TO CHECK BLOOD SUGARS THREE TIMES DAILY 300 strip 3  . ONETOUCH VERIO test strip USE  AS DIRECTED THREE TIMES DAILY 100 strip 4  . potassium chloride (KLOR-CON) 10 MEQ tablet TAKE 3 TABLETS(30 MEQ) BY MOUTH DAILY 90 tablet 0  . rosuvastatin (CRESTOR) 20 MG tablet TAKE 1 TABLET(20 MG) BY MOUTH DAILY 90 tablet 3  . tiZANidine (ZANAFLEX) 2 MG tablet TAKE 1 TABLET(2 MG) BY MOUTH EVERY 6 HOURS AS NEEDED FOR MUSCLE SPASMS 30 tablet 1  . TOUJEO MAX SOLOSTAR 300 UNIT/ML Solostar Pen ADMINISTER 100 UNITS UNDER THE SKIN DAILY BEFORE SUPPER 6 mL 2   No current facility-administered medications on file prior to visit.        ROS:  All others reviewed and negative.  Objective        PE:  BP 138/80   Pulse 93   Temp 98.4 F (36.9 C) (Oral)   Ht $R'5\' 4"'ic$  (1.626 m)   Wt 186 lb (84.4 kg)   SpO2 98%   BMI 31.93 kg/m                 Constitutional: Pt appears in NAD               HENT: Head: NCAT.                Right Ear: External ear normal.                 Left Ear: External ear normal.                Eyes: . Pupils are equal, round, and reactive to light. Conjunctivae and EOM are normal               Nose: without d/c or deformity               Neck: Neck supple. Gross normal ROM               Cardiovascular: Normal rate and regular rhythm.                 Pulmonary/Chest: Effort normal and breath sounds without rales or wheezing.                Abd:  Soft, NT, ND, + BS, no organomegaly               Neurological: Pt is alert. At baseline orientation, motor grossly intact               Skin: Skin is warm. No rashes, no other new lesions, LE edema - trace bilat               Psychiatric: Pt behavior is normal without agitation   Micro: none  Cardiac tracings I have personally interpreted today:  none  Pertinent Radiological findings (summarize): none   Lab Results  Component Value Date   WBC 5.1 08/30/2019   HGB 12.1 08/30/2019   HCT 38.4 08/30/2019   PLT 213.0 08/30/2019   GLUCOSE 149 (H) 04/14/2020  CHOL 109 01/09/2020   TRIG 149 01/09/2020   HDL 36 (L) 01/09/2020    LDLDIRECT 153.4 01/29/2009   LDLCALC 50 01/09/2020   ALT 20 01/09/2020   AST 19 01/09/2020   NA 143 04/14/2020   K 3.8 04/14/2020   CL 106 04/14/2020   CREATININE 1.17 04/14/2020   BUN 21 04/14/2020   CO2 28 04/14/2020   TSH 3.66 08/30/2019   HGBA1C 10.8 (H) 04/14/2020   MICROALBUR 10.0 (H) 08/20/2019   Assessment/Plan:  TAFFY DELCONTE is a 70 y.o. Black or African American [2] female with  has a past medical history of ALLERGIC RHINITIS (01/29/2007), Arthritis, ASTHMA (01/29/2007), ASTHMA, WITH ACUTE EXACERBATION (06/24/2008), BACK PAIN (01/29/2009), CHEST PAIN (06/24/2008), Family history of adverse reaction to anesthesia, GERD (gastroesophageal reflux disease), History of shingles, HYPERLIPIDEMIA (01/29/2007), HYPERTENSION (01/29/2007), OSTEOPENIA (10/16/2007), and Type II or unspecified type diabetes mellitus without mention of complication, uncontrolled (11/29/2013).  Preventative health care Age and sex appropriate education and counseling updated with regular exercise and diet Referrals for preventative services - for f/u optho appt soon - pt to make appt Immunizations addressed - declines covid vax Smoking counseling  - none needed Evidence for depression or other mood disorder - none significant Most recent labs reviewed. I have personally reviewed and have noted: 1) the patient's medical and social history 2) The patient's current medications and supplements 3) The patient's height, weight, and BMI have been recorded in the chart   Diabetes mellitus type II, non insulin dependent ([place]) Lab Results  Component Value Date   HGBA1C 10.8 (H) 04/14/2020   Stable, pt to continue current medical treatment synjardy, humulin R, toujeo  Current Outpatient Medications (Endocrine & Metabolic):  Marland Kitchen  Empagliflozin-metFORMIN HCl ER (SYNJARDY XR) 08-998 MG TB24, Take 2 tablets by mouth daily. Marland Kitchen  HUMULIN R U-500 KWIKPEN 500 UNIT/ML kwikpen, ADMINISTER 60 UNITS UNDER THE SKIN 30 MINUTES BEFORE  MEALS .  TOUJEO MAX SOLOSTAR 300 UNIT/ML Solostar Pen, ADMINISTER 100 UNITS UNDER THE SKIN DAILY BEFORE SUPPER  Current Outpatient Medications (Cardiovascular):  .  benazepril (LOTENSIN) 20 MG tablet, TAKE 1 TABLET(20 MG) BY MOUTH DAILY .  losartan (COZAAR) 50 MG tablet, Take 1 tablet (50 mg total) by mouth daily. .  rosuvastatin (CRESTOR) 20 MG tablet, TAKE 1 TABLET(20 MG) BY MOUTH DAILY  Current Outpatient Medications (Respiratory):  .  albuterol (PROAIR HFA) 108 (90 Base) MCG/ACT inhaler, INHALE 2 PUFFS BY MOUTH EVERY 6 HOURS AS NEEDED FOR WHEEZING OR SHORTNESS OF BREATH  Current Outpatient Medications (Analgesics):  .  aspirin 81 MG tablet, Take 81 mg by mouth every morning.   Current Outpatient Medications (Other):  .  Blood Glucose Monitoring Suppl (ONE TOUCH ULTRA 2) w/Device KIT, Use as directed .  Lancets MISC, Use as directed four times daily  E11.9 .  Multiple Vitamin (MULTIVITAMIN WITH MINERALS) TABS tablet, Take 1 tablet by mouth daily. Glory Rosebush ULTRA test strip, USE TO CHECK BLOOD SUGARS THREE TIMES DAILY .  ONETOUCH VERIO test strip, USE AS DIRECTED THREE TIMES DAILY .  potassium chloride (KLOR-CON) 10 MEQ tablet, TAKE 3 TABLETS(30 MEQ) BY MOUTH DAILY .  tiZANidine (ZANAFLEX) 2 MG tablet, TAKE 1 TABLET(2 MG) BY MOUTH EVERY 6 HOURS AS NEEDED FOR MUSCLE SPASMS   Essential hypertension BP Readings from Last 3 Encounters:  07/08/20 138/80  04/17/20 130/70  03/04/20 (!) 160/100   Stable, pt to continue medical treatment losartan   HLD (hyperlipidemia) Lab Results  Component Value Date  [place] 50 01/09/2020   Stable, pt to continue current statin crestor 20   Followup: Return in about 1 year (around 07/08/2021).  [name], MD 07/08/2020 8:55 PM [facility]'

## 2020-07-08 NOTE — Assessment & Plan Note (Signed)
Age and sex appropriate education and counseling updated with regular exercise and diet Referrals for preventative services - for f/u optho appt soon - pt to make appt Immunizations addressed - declines covid vax Smoking counseling  - none needed Evidence for depression or other mood disorder - none significant Most recent labs reviewed. I have personally reviewed and have noted: 1) the patient's medical and social history 2) The patient's current medications and supplements 3) The patient's height, weight, and BMI have been recorded in the chart

## 2020-07-08 NOTE — Assessment & Plan Note (Signed)
Lab Results  Component Value Date   HGBA1C 10.8 (H) 04/14/2020   Stable, pt to continue current medical treatment synjardy, humulin R, toujeo  Current Outpatient Medications (Endocrine & Metabolic):  Marland Kitchen  Empagliflozin-metFORMIN HCl ER (SYNJARDY XR) 08-998 MG TB24, Take 2 tablets by mouth daily. Marland Kitchen  HUMULIN R U-500 KWIKPEN 500 UNIT/ML kwikpen, ADMINISTER 60 UNITS UNDER THE SKIN 30 MINUTES BEFORE MEALS .  TOUJEO MAX SOLOSTAR 300 UNIT/ML Solostar Pen, ADMINISTER 100 UNITS UNDER THE SKIN DAILY BEFORE SUPPER  Current Outpatient Medications (Cardiovascular):  .  benazepril (LOTENSIN) 20 MG tablet, TAKE 1 TABLET(20 MG) BY MOUTH DAILY .  losartan (COZAAR) 50 MG tablet, Take 1 tablet (50 mg total) by mouth daily. .  rosuvastatin (CRESTOR) 20 MG tablet, TAKE 1 TABLET(20 MG) BY MOUTH DAILY  Current Outpatient Medications (Respiratory):  .  albuterol (PROAIR HFA) 108 (90 Base) MCG/ACT inhaler, INHALE 2 PUFFS BY MOUTH EVERY 6 HOURS AS NEEDED FOR WHEEZING OR SHORTNESS OF BREATH  Current Outpatient Medications (Analgesics):  .  aspirin 81 MG tablet, Take 81 mg by mouth every morning.   Current Outpatient Medications (Other):  .  Blood Glucose Monitoring Suppl (ONE TOUCH ULTRA 2) w/Device KIT, Use as directed .  Lancets MISC, Use as directed four times daily  E11.9 .  Multiple Vitamin (MULTIVITAMIN WITH MINERALS) TABS tablet, Take 1 tablet by mouth daily. Glory Rosebush ULTRA test strip, USE TO CHECK BLOOD SUGARS THREE TIMES DAILY .  ONETOUCH VERIO test strip, USE AS DIRECTED THREE TIMES DAILY .  potassium chloride (KLOR-CON) 10 MEQ tablet, TAKE 3 TABLETS(30 MEQ) BY MOUTH DAILY .  tiZANidine (ZANAFLEX) 2 MG tablet, TAKE 1 TABLET(2 MG) BY MOUTH EVERY 6 HOURS AS NEEDED FOR MUSCLE SPASMS

## 2020-07-08 NOTE — Patient Instructions (Signed)
Please continue all other medications as before, and refills have been done if requested.  Please have the pharmacy call with any other refills you may need.  Please continue your efforts at being more active, low cholesterol diet, and weight control.  You are otherwise up to date with prevention measures today.  Please keep your appointments with your specialists as you may have planned  Please go to the LAB at the blood drawing area for the tests to be done  You will be contacted by phone if any changes need to be made immediately.  Otherwise, you will receive a letter about your results with an explanation, but please check with MyChart first.  Please remember to sign up for MyChart if you have not done so, as this will be important to you in the future with finding out test results, communicating by private email, and scheduling acute appointments online when needed.  Please make an Appointment to return for your 1 year visit, or sooner if needed, with Lab testing by Appointment as well, to be done about 3-5 days before at the FIRST FLOOR Lab (so this is for TWO appointments - please see the scheduling desk as you leave)   Due to the ongoing Covid 19 pandemic, our lab now requires an appointment for any labs done at our office.  If you need labs done and do not have an appointment, please call our office ahead of time to schedule before presenting to the lab for your testing.  

## 2020-07-08 NOTE — Assessment & Plan Note (Signed)
BP Readings from Last 3 Encounters:  07/08/20 138/80  04/17/20 130/70  03/04/20 (!) 160/100   Stable, pt to continue medical treatment losartan

## 2020-07-08 NOTE — Assessment & Plan Note (Signed)
Lab Results  Component Value Date   LDLCALC 50 01/09/2020   Stable, pt to continue current statin crestor 20

## 2020-07-09 ENCOUNTER — Encounter: Payer: Self-pay | Admitting: Internal Medicine

## 2020-07-09 LAB — URINALYSIS, ROUTINE W REFLEX MICROSCOPIC
Bilirubin Urine: NEGATIVE
Hgb urine dipstick: NEGATIVE
Ketones, ur: NEGATIVE
Leukocytes,Ua: NEGATIVE
Nitrite: NEGATIVE
RBC / HPF: NONE SEEN (ref 0–?)
Specific Gravity, Urine: 1.01 (ref 1.000–1.030)
Total Protein, Urine: NEGATIVE
Urine Glucose: >=1000 — AB
Urobilinogen, UA: 0.2 (ref 0.0–1.0)
pH: 6 (ref 5.0–8.0)

## 2020-07-15 ENCOUNTER — Other Ambulatory Visit: Payer: BC Managed Care – PPO

## 2020-07-15 DIAGNOSIS — E119 Type 2 diabetes mellitus without complications: Secondary | ICD-10-CM | POA: Diagnosis not present

## 2020-07-15 LAB — HM DIABETES EYE EXAM

## 2020-07-22 ENCOUNTER — Ambulatory Visit: Payer: BC Managed Care – PPO | Admitting: Endocrinology

## 2020-08-14 ENCOUNTER — Encounter: Payer: Self-pay | Admitting: Internal Medicine

## 2020-08-18 ENCOUNTER — Other Ambulatory Visit: Payer: Self-pay | Admitting: Internal Medicine

## 2020-08-18 NOTE — Telephone Encounter (Signed)
We probably should let Dr Dwyane Dee address this, thanks

## 2020-08-20 ENCOUNTER — Other Ambulatory Visit: Payer: Self-pay | Admitting: Endocrinology

## 2020-08-20 DIAGNOSIS — E7800 Pure hypercholesterolemia, unspecified: Secondary | ICD-10-CM

## 2020-08-20 DIAGNOSIS — E119 Type 2 diabetes mellitus without complications: Secondary | ICD-10-CM

## 2020-08-22 ENCOUNTER — Other Ambulatory Visit: Payer: Self-pay

## 2020-08-22 ENCOUNTER — Other Ambulatory Visit (INDEPENDENT_AMBULATORY_CARE_PROVIDER_SITE_OTHER): Payer: Medicare Other

## 2020-08-22 DIAGNOSIS — E7800 Pure hypercholesterolemia, unspecified: Secondary | ICD-10-CM | POA: Diagnosis not present

## 2020-08-22 DIAGNOSIS — E119 Type 2 diabetes mellitus without complications: Secondary | ICD-10-CM

## 2020-08-22 LAB — LIPID PANEL
Cholesterol: 201 mg/dL — ABNORMAL HIGH (ref 0–200)
HDL: 48.7 mg/dL (ref >39.00)
LDL Cholesterol: 132 mg/dL — ABNORMAL HIGH (ref 0–99)
NonHDL: 152.71
Total CHOL/HDL Ratio: 4
Triglycerides: 106 mg/dL (ref 0.0–149.0)
VLDL: 21.2 mg/dL (ref 0.0–40.0)

## 2020-08-22 LAB — GLUCOSE, RANDOM: Glucose, Bld: 92 mg/dL (ref 70–99)

## 2020-08-26 ENCOUNTER — Ambulatory Visit (INDEPENDENT_AMBULATORY_CARE_PROVIDER_SITE_OTHER): Payer: Medicare Other | Admitting: Endocrinology

## 2020-08-26 ENCOUNTER — Encounter: Payer: Self-pay | Admitting: Endocrinology

## 2020-08-26 ENCOUNTER — Other Ambulatory Visit: Payer: Self-pay

## 2020-08-26 VITALS — BP 170/98 | HR 100 | Ht 64.0 in | Wt 188.4 lb

## 2020-08-26 DIAGNOSIS — I10 Essential (primary) hypertension: Secondary | ICD-10-CM

## 2020-08-26 DIAGNOSIS — Z794 Long term (current) use of insulin: Secondary | ICD-10-CM

## 2020-08-26 DIAGNOSIS — E1165 Type 2 diabetes mellitus with hyperglycemia: Secondary | ICD-10-CM

## 2020-08-26 DIAGNOSIS — E782 Mixed hyperlipidemia: Secondary | ICD-10-CM

## 2020-08-26 NOTE — Patient Instructions (Addendum)
Toujeo 100 unless am sugar stays < 90  Check blood sugars on waking up 3-4 days a week  Also check blood sugars about 2 hours after meals and do this after different meals by rotation  Recommended blood sugar levels on waking up are 90-130 and about 2 hours after meal is 130-180  Please bring your blood sugar monitor to each visit, thank you  Lunch dose 30-40 and adjust based on meal size  Restart Rosuvastatin and LOSARTAN

## 2020-08-26 NOTE — Progress Notes (Signed)
Patient ID: Deborah Neal, female   DOB: [birth year], 70 y.o.   MRN: [id number]          Reason for Appointment: Follow-up for Type 2 Diabetes  Referring physician: Cathlean Neal   History of Present Illness:          Date of diagnosis of type 2 diabetes mellitus:        Background history: She was initially started on metformin but apparently since severely since blood sugar not controlled she was switched to insulin in 2018 She has taken various types of insulin in the past including premixed insulin with usually poor control Does not have a history of taking a GLP-1 drug or medication like Invokana Her blood sugars were under fair control until 6/18 when her  A1c+ went up to 9.2 and subsequently has been higher  Recent history:    INSULIN regimen:  Humulin R U-500, 30 units in the morning, 30 at lunch and 60 at dinner Toujeo 100 units in the morning   Non-insulin hypoglycemic drugs: Synjardy XR 08/998, 2 tablets daily  Her A1c is much better than usual at 7.4 compared to 10.8   Current management, blood sugar patterns and problems identified:  She was told to take 90 units of the Toujeo but she is mostly taking 100 units  She thinks she is usually taking the Humulin R insulin at breakfast and lunch as well as suppertime although she is somewhat unclear about how much she is taking  However she is likely taking her Humulin R and TOUJEO much more regularly since her blood sugars are significantly better without increasing her doses  She has had only 1 low reading of 58 but this was preceded by a high reading in the morning of 311  She says that she is also taking her Synjardy regularly and last prescription was refilled by PCP last month  She forgets to check her sugars and is doing only a few readings in the mornings  Blood sugars may be sporadically higher in the morning but not clear if this is from inadequate or missed doses at night  She thinks her blood sugar of over  240 this morning was from a late night snack  Weight is about the same  Side effects from medications have been: None   Compliance with the medical regimen: Usually poor  Glucose monitoring:  done 1-2 times a day         Glucometer: One Touch Ultra.       Blood Glucose readings   PRE-MEAL Fasting Lunch Dinner Bedtime Overall  Glucose range:  100-311   58, 169    Mean/median:  174    162   Previous readings:   PRE-MEAL  morning Lunch Dinner Bedtime Overall  Glucose range:  74-364  206-317  302 ?   Mean/median:  199  248   202         Typical meal intake: Breakfast is cereal, oatmeal or toast and boiled egg, lunch  or meat sandwich  Usually has meat and potatoes at dinner at 7 pm Snacks: Popcorn,  or fruit                Dietician visit, most recent: 6/19  Weight history:  Wt Readings from Last 3 Encounters:  08/26/20 188 lb 6.4 oz (85.5 kg)  07/08/20 186 lb (84.4 kg)  04/17/20 187 lb 6.4 oz (85 kg)    Glycemic control:   Lab Results  Component  Value Date   HGBA1C 7.4 (H) 07/08/2020   HGBA1C 10.8 (H) 04/14/2020   HGBA1C 10.5 (H) 01/09/2020   Lab Results  Component Value Date   MICROALBUR 1.5 07/08/2020   LDLCALC 132 (H) 08/22/2020   CREATININE 1.25 (H) 07/08/2020   Lab Results  Component Value Date   MICRALBCREAT 1.3 07/08/2020    Lab Results  Component Value Date   FRUCTOSAMINE 443 (H) 02/29/2020   FRUCTOSAMINE 292 (H) 09/20/2019   FRUCTOSAMINE 277 02/12/2019   Lab on 08/22/2020  Component Date Value Ref Range Status  . Cholesterol 08/22/2020 201* 0 - 200 mg/dL Final   ATP III Classification       Desirable:  < 200 mg/dL               Borderline High:  200 - 239 mg/dL          High:  > = 240 mg/dL  . Triglycerides 08/22/2020 106.0  0.0 - 149.0 mg/dL Final   Normal:  <150 mg/dLBorderline High:  150 - 199 mg/dL  . HDL 08/22/2020 48.70  >39.00 mg/dL Final  . VLDL 08/22/2020 21.2  0.0 - 40.0 mg/dL Final  . LDL Cholesterol 08/22/2020 132* 0 - 99  mg/dL Final  . Total CHOL/HDL Ratio 08/22/2020 4   Final                  Men          Women1/2 Average Risk     3.4          3.3Average Risk          5.0          4.42X Average Risk          9.6          7.13X Average Risk          15.0          11.0                      . NonHDL 08/22/2020 152.71   Final   NOTE:  Non-HDL goal should be 30 mg/dL higher than patient's LDL goal (i.e. LDL goal of < 70 mg/dL, would have non-HDL goal of < 100 mg/dL)  . Glucose, Bld 08/22/2020 92  70 - 99 mg/dL Final      Allergies as of 08/26/2020   No Known Allergies     Medication List       Accurate as of Aug 26, 2020 10:16 AM. If you have any questions, ask your nurse or doctor.        albuterol 108 (90 Base) MCG/ACT inhaler Commonly known as: ProAir HFA INHALE 2 PUFFS BY MOUTH EVERY 6 HOURS AS NEEDED FOR WHEEZING OR SHORTNESS OF BREATH   aspirin 81 MG tablet Take 81 mg by mouth every morning.   benazepril 20 MG tablet Commonly known as: LOTENSIN TAKE 1 TABLET(20 MG) BY MOUTH DAILY   HumuLIN R U-500 KwikPen 500 UNIT/ML kwikpen Generic drug: insulin regular human CONCENTRATED ADMINISTER 60 UNITS UNDER THE SKIN 30 MINUTES BEFORE MEALS   Lancets Misc Use as directed four times daily  E11.9   losartan 50 MG tablet Commonly known as: COZAAR Take 1 tablet (50 mg total) by mouth daily.   multivitamin with minerals Tabs tablet Take 1 tablet by mouth daily.   ONE TOUCH ULTRA 2 w/Device Kit Use as directed   OneTouch Verio test strip Generic drug: glucose  blood USE AS DIRECTED THREE TIMES DAILY   OneTouch Ultra test strip Generic drug: glucose blood USE TO CHECK BLOOD SUGARS THREE TIMES DAILY   potassium chloride 10 MEQ tablet Commonly known as: KLOR-CON TAKE 3 TABLETS(30 MEQ) BY MOUTH DAILY   rosuvastatin 20 MG tablet Commonly known as: CRESTOR TAKE 1 TABLET(20 MG) BY MOUTH DAILY   Synjardy XR 08-998 MG Tb24 Generic drug: Empagliflozin-metFORMIN HCl ER TAKE 2 TABLETS BY MOUTH  DAILY   tiZANidine 2 MG tablet Commonly known as: ZANAFLEX TAKE 1 TABLET(2 MG) BY MOUTH EVERY 6 HOURS AS NEEDED FOR MUSCLE SPASMS   Toujeo Max SoloStar 300 UNIT/ML Solostar Pen Generic drug: insulin glargine (2 Unit Dial) ADMINISTER 100 UNITS UNDER THE SKIN DAILY BEFORE SUPPER       Allergies: No Known Allergies  Past Medical History:  Diagnosis Date  . ALLERGIC RHINITIS 01/29/2007  . Arthritis   . ASTHMA 01/29/2007  . ASTHMA, WITH ACUTE EXACERBATION 06/24/2008  . BACK PAIN 01/29/2009  . CHEST PAIN 06/24/2008  . Family history of adverse reaction to anesthesia    Patients daughter has N/V after anesthesia  . GERD (gastroesophageal reflux disease)   . History of shingles   . HYPERLIPIDEMIA 01/29/2007  . HYPERTENSION 01/29/2007  . OSTEOPENIA 10/16/2007  . Type II or unspecified type diabetes mellitus without mention of complication, uncontrolled 11/29/2013    Past Surgical History:  Procedure Laterality Date  . CHOLECYSTECTOMY N/A 09/19/2015   Procedure: LAPAROSCOPIC CHOLECYSTECTOMY WITH INTRAOPERATIVE CHOLANGIOGRAM;  Surgeon: Jackolyn Confer, MD;  Location: [facility];  Service: General;  Laterality: N/A;  . COLONOSCOPY    . TONSILLECTOMY  1956  . VIDEO BRONCHOSCOPY Bilateral 03/28/2014   Procedure: VIDEO BRONCHOSCOPY WITHOUT FLUORO;  Surgeon: Tanda Rockers, MD;  Location: [facility];  Service: Cardiopulmonary;  Laterality: Bilateral;    Family History  Problem Relation Age of Onset  . Cancer Mother        ? type   . Diabetes Father   . Hypertension Father   . Heart disease Father   . Asthma Father   . Asthma Daughter   . Colon cancer Neg Hx   . Esophageal cancer Neg Hx   . Rectal cancer Neg Hx   . Stomach cancer Neg Hx   . Breast cancer Neg Hx     Social History:  reports that she quit smoking about 16 years ago. She has never used smokeless tobacco. She reports that she does not drink alcohol and does not use drugs.   Review of Systems   Lipid history: On Crestor 20  for lipid control, prescribed by  PCP Apparently from pharmacy record she has not had a prescription filled since 11/21  Lab Results  Component Value Date   CHOL 201 (H) 08/22/2020   CHOL 230 (H) 07/08/2020   CHOL 109 01/09/2020   Lab Results  Component Value Date   HDL 48.70 08/22/2020   HDL 55.70 07/08/2020   HDL 36 (L) 01/09/2020   Lab Results  Component Value Date   LDLCALC 132 (H) 08/22/2020   LDLCALC 145 (H) 07/08/2020   LDLCALC 50 01/09/2020   Lab Results  Component Value Date   TRIG 106.0 08/22/2020   TRIG 148.0 07/08/2020   TRIG 149 01/09/2020   Lab Results  Component Value Date   CHOLHDL 4 08/22/2020   CHOLHDL 4 07/08/2020   CHOLHDL 3.0 01/09/2020   Lab Results  Component Value Date   LDLDIRECT 153.4 01/29/2009   LDLDIRECT 149.6 10/04/2006  Hypertension:On losartan but Lotensin is also listed, again does not know what she is taking After much questioning she reports that she has not refilled her medication recently  Blood pressure is unusually high today  BP Readings from Last 3 Encounters:  08/26/20 (!) 170/98  07/08/20 138/80  04/17/20 130/70   No history of diabetic nephropathy  Lab Results  Component Value Date   CREATININE 1.25 (H) 07/08/2020   CREATININE 1.17 04/14/2020   CREATININE 1.05 02/29/2020    Most recent eye exam was in 04/2018  Most recent foot exam: 3/21  Abnormal TSH: She has had annual TSH tests for screening from her PCP Had transient increase in TSH previously and it is again relatively high at 5.11 She does not complain of fatigue   Lab Results  Component Value Date   TSH 5.11 (H) 07/08/2020   TSH 3.66 08/30/2019   TSH 3.79 02/12/2019   FREET4 0.64 02/12/2019      LABS:  Lab on 08/22/2020  Component Date Value Ref Range Status  . Cholesterol 08/22/2020 201* 0 - 200 mg/dL Final   ATP III Classification       Desirable:  < 200 mg/dL               Borderline High:  200 - 239 mg/dL          High:   > = 240 mg/dL  . Triglycerides 08/22/2020 106.0  0.0 - 149.0 mg/dL Final   Normal:  <150 mg/dLBorderline High:  150 - 199 mg/dL  . HDL 08/22/2020 48.70  >39.00 mg/dL Final  . VLDL 08/22/2020 21.2  0.0 - 40.0 mg/dL Final  . LDL Cholesterol 08/22/2020 132* 0 - 99 mg/dL Final  . Total CHOL/HDL Ratio 08/22/2020 4   Final                  Men          Women1/2 Average Risk     3.4          3.3Average Risk          5.0          4.42X Average Risk          9.6          7.13X Average Risk          15.0          11.0                      . NonHDL 08/22/2020 152.71   Final   NOTE:  Non-HDL goal should be 30 mg/dL higher than patient's LDL goal (i.e. LDL goal of < 70 mg/dL, would have non-HDL goal of < 100 mg/dL)  . Glucose, Bld 08/22/2020 92  70 - 99 mg/dL Final    Physical Examination:  BP (!) 170/98   Pulse 100   Ht _0  (1.626 m)   Wt 188 lb 6.4 oz (85.5 kg)   SpO2 98%   BMI 32.34 kg/m         ASSESSMENT:  Diabetes type 2, with obesity on insulin  See history of present illness for detailed discussion of current diabetes management, blood sugar patterns and problems identified  Her A1c is surprisingly better at 7.4 compared to 10.8  Her blood sugars are being monitored mostly in the mornings and these are variable although more stable Likely is taking all her insulin doses more regularly now However she  is probably inconsistent with her diet and possibly drinking some regular soft drinks  Hypertension: Blood pressure is inconsistently controlled, reportedly on losartan only which she is out of Discussed importance of taking her antihypertensive medication regularly  Lipids: Still not controlled with likely noncompliance with her Crestor   PLAN:    For her insulin doses she will continue the same basic plan  She can adjust her dose especially at lunchtime up or down 10 units if eating larger or smaller meals  Also needs to avoid doubling her insulin dose in the morning when  his blood sugars are high as previously this has caused the low sugar  Follow-up she will continue to taking 100 units of Toujeo regularly but reduce it to 90 if morning sugars are starting to come down significantly to 90 or so  Regular exercise  Avoid soft drinks with sugar  Discussed in detail using freestyle libre with the patient and showed her how this would be useful and method of application and information provided Will send fax to [facility] to try and get this approved through Medicare  She will start taking her Crestor and blood pressure medication regularly  She likely needs to bring all her medications to each visit to help assess her compliance  Follow-up in 2 months unless blood sugars are not controlled or getting excessive hypoglycemia  There are no Patient Instructions on file for this visit.           Elayne Snare 08/26/2020, 10:16 AM   Note: This office note was prepared with Dragon voice recognition system technology. Any transcriptional errors that result from this process are unintentional.

## 2020-09-03 ENCOUNTER — Other Ambulatory Visit: Payer: Self-pay | Admitting: Internal Medicine

## 2020-09-04 ENCOUNTER — Other Ambulatory Visit: Payer: Self-pay | Admitting: Internal Medicine

## 2020-11-09 ENCOUNTER — Emergency Department (HOSPITAL_COMMUNITY): Payer: Medicare Other

## 2020-11-09 ENCOUNTER — Other Ambulatory Visit: Payer: Self-pay

## 2020-11-09 ENCOUNTER — Emergency Department (HOSPITAL_COMMUNITY)
Admission: EM | Admit: 2020-11-09 | Discharge: 2020-11-09 | Disposition: A | Discharge status: Home / Self Care | Payer: Medicare Other | Attending: Emergency Medicine | Admitting: Emergency Medicine

## 2020-11-09 ENCOUNTER — Encounter (HOSPITAL_COMMUNITY): Payer: Self-pay

## 2020-11-09 DIAGNOSIS — Z794 Long term (current) use of insulin: Secondary | ICD-10-CM | POA: Insufficient documentation

## 2020-11-09 DIAGNOSIS — J45909 Unspecified asthma, uncomplicated: Secondary | ICD-10-CM | POA: Insufficient documentation

## 2020-11-09 DIAGNOSIS — Z20822 Contact with and (suspected) exposure to covid-19: Secondary | ICD-10-CM | POA: Insufficient documentation

## 2020-11-09 DIAGNOSIS — Z79899 Other long term (current) drug therapy: Secondary | ICD-10-CM | POA: Diagnosis not present

## 2020-11-09 DIAGNOSIS — R197 Diarrhea, unspecified: Secondary | ICD-10-CM | POA: Diagnosis not present

## 2020-11-09 DIAGNOSIS — Z87891 Personal history of nicotine dependence: Secondary | ICD-10-CM | POA: Insufficient documentation

## 2020-11-09 DIAGNOSIS — K219 Gastro-esophageal reflux disease without esophagitis: Secondary | ICD-10-CM | POA: Diagnosis not present

## 2020-11-09 DIAGNOSIS — Z8616 Personal history of COVID-19: Secondary | ICD-10-CM | POA: Diagnosis not present

## 2020-11-09 DIAGNOSIS — R000 Tachycardia, unspecified: Secondary | ICD-10-CM | POA: Insufficient documentation

## 2020-11-09 DIAGNOSIS — I10 Essential (primary) hypertension: Secondary | ICD-10-CM | POA: Insufficient documentation

## 2020-11-09 DIAGNOSIS — Z7982 Long term (current) use of aspirin: Secondary | ICD-10-CM | POA: Insufficient documentation

## 2020-11-09 DIAGNOSIS — R0789 Other chest pain: Secondary | ICD-10-CM | POA: Diagnosis not present

## 2020-11-09 DIAGNOSIS — E119 Type 2 diabetes mellitus without complications: Secondary | ICD-10-CM | POA: Diagnosis not present

## 2020-11-09 DIAGNOSIS — R112 Nausea with vomiting, unspecified: Secondary | ICD-10-CM | POA: Diagnosis not present

## 2020-11-09 DIAGNOSIS — J9811 Atelectasis: Secondary | ICD-10-CM | POA: Diagnosis not present

## 2020-11-09 DIAGNOSIS — R109 Unspecified abdominal pain: Secondary | ICD-10-CM | POA: Diagnosis not present

## 2020-11-09 DIAGNOSIS — I700 Atherosclerosis of aorta: Secondary | ICD-10-CM | POA: Diagnosis not present

## 2020-11-09 LAB — COMPREHENSIVE METABOLIC PANEL
ALT: 27 U/L (ref 0–44)
AST: 29 U/L (ref 15–41)
Albumin: 4.4 g/dL (ref 3.5–5.0)
Alkaline Phosphatase: 75 U/L (ref 38–126)
Anion gap: 9 (ref 5–15)
BUN: 17 mg/dL (ref 8–23)
CO2: 25 mmol/L (ref 22–32)
Calcium: 9.6 mg/dL (ref 8.9–10.3)
Chloride: 107 mmol/L (ref 98–111)
Creatinine, Ser: 1.11 mg/dL — ABNORMAL HIGH (ref 0.44–1.00)
GFR, Estimated: 54 mL/min — ABNORMAL LOW (ref >60)
Glucose, Bld: 150 mg/dL — ABNORMAL HIGH (ref 70–99)
Potassium: 4 mmol/L (ref 3.5–5.1)
Sodium: 141 mmol/L (ref 135–145)
Total Bilirubin: 0.5 mg/dL (ref 0.3–1.2)
Total Protein: 7.7 g/dL (ref 6.5–8.1)

## 2020-11-09 LAB — URINALYSIS, ROUTINE W REFLEX MICROSCOPIC
Bilirubin Urine: NEGATIVE
Glucose, UA: >=500 mg/dL — AB
Hgb urine dipstick: NEGATIVE
Ketones, ur: NEGATIVE mg/dL
Nitrite: NEGATIVE
Protein, ur: NEGATIVE mg/dL
Specific Gravity, Urine: >1.046 — ABNORMAL HIGH (ref 1.005–1.030)
pH: 5 (ref 5.0–8.0)

## 2020-11-09 LAB — CBC
HCT: 43.6 % (ref 36.0–46.0)
Hemoglobin: 13.2 g/dL (ref 12.0–15.0)
MCH: 23.2 pg — ABNORMAL LOW (ref 26.0–34.0)
MCHC: 30.3 g/dL (ref 30.0–36.0)
MCV: 76.8 fL — ABNORMAL LOW (ref 80.0–100.0)
Platelets: 255 10*3/uL (ref 150–400)
RBC: 5.68 MIL/uL — ABNORMAL HIGH (ref 3.87–5.11)
RDW: 16 % — ABNORMAL HIGH (ref 11.5–15.5)
WBC: 8.7 10*3/uL (ref 4.0–10.5)
nRBC: 0 % (ref 0.0–0.2)

## 2020-11-09 LAB — RESP PANEL BY RT-PCR (FLU A&B, COVID) ARPGX2
Influenza A by PCR: NEGATIVE
Influenza B by PCR: NEGATIVE
SARS Coronavirus 2 by RT PCR: NEGATIVE

## 2020-11-09 LAB — LIPASE, BLOOD: Lipase: 34 U/L (ref 11–51)

## 2020-11-09 MED ORDER — ONDANSETRON HCL 4 MG/2ML IJ SOLN
4.0000 mg | Freq: Once | INTRAMUSCULAR | Status: AC
  Administered 2020-11-09: 4 mg via INTRAVENOUS
  Filled 2020-11-09: qty 2

## 2020-11-09 MED ORDER — IOHEXOL 350 MG/ML SOLN
80.0000 mL | Freq: Once | INTRAVENOUS | Status: AC | PRN
  Administered 2020-11-09: 80 mL via INTRAVENOUS

## 2020-11-09 MED ORDER — SODIUM CHLORIDE 0.9 % IV BOLUS
500.0000 mL | Freq: Once | INTRAVENOUS | Status: AC
  Administered 2020-11-09: 500 mL via INTRAVENOUS

## 2020-11-09 NOTE — Discharge Instructions (Addendum)
Please drink only clear liquids for the next 12 hours.  If you continue to be able to tolerate liquids without difficulty you can advance your diet slowly.  There is no evidence of acute abnormalities on your labs or CAT scan today.  However if your symptoms worsen or you are unable to tolerate liquids, please return for reevaluation.

## 2020-11-09 NOTE — ED Provider Notes (Signed)
[facility] DEPT Provider Note   CSN: [id number] Arrival date & time: 11/09/20  1924     History Chief Complaint  Patient presents with   Diarrhea   Flank Pain   Emesis    CHENILLE TOOR is a 70 y.o. female.  HPI    70 yo female presents today complaining of n/v/d that began one hour ago and has had two episodes of each.  No blood or bile noted.  She denies similar symptoms on past.  She has left flank pain.   H?O gallbladder surgery distant past. No uti symptoms.  Past Medical History:  Diagnosis Date   ALLERGIC RHINITIS 01/29/2007   Arthritis    ASTHMA 01/29/2007   ASTHMA, WITH ACUTE EXACERBATION 06/24/2008   BACK PAIN 01/29/2009   CHEST PAIN 06/24/2008   Family history of adverse reaction to anesthesia    Patients daughter has N/V after anesthesia   GERD (gastroesophageal reflux disease)    History of shingles    HYPERLIPIDEMIA 01/29/2007   HYPERTENSION 01/29/2007   OSTEOPENIA 10/16/2007   Type II or unspecified type diabetes mellitus without mention of complication, uncontrolled 11/29/2013    Patient Active Problem List   Diagnosis Date Noted   HLD (hyperlipidemia) 07/08/2020   COVID-19 virus infection 08/31/2019   Renal insufficiency 08/30/2019   Low back pain 08/30/2019   Upper back pain on left side 08/30/2019   Upper respiratory infection 08/30/2019   Vaginitis 10/27/2018   Cough 06/29/2018   Asthma exacerbation 06/29/2018   Right leg weakness 06/15/2017   Toe pain, right 02/25/2017   Hyperglycemia 38/37/7939   Acute metabolic encephalopathy 68/86/4847   Thrombocytopenia ([place]) 02/21/2017   Pancytopenia ([place]) 02/21/2017   Preventative health care 09/28/2016   Dehydration    Hyperglycemic crisis in diabetes mellitus ([place]) 07/06/2016   AKI (acute kidney injury) ([place]) 07/06/2016   Hyperkalemia 07/06/2016   S/P laparoscopic cholecystectomy 09/19/2015   Abnormal chest CT 06/21/2014   Diabetes mellitus type II, non insulin dependent  ([place]) 11/29/2013   Obesity, unspecified 09/19/2012   Plantar fasciitis, bilateral 06/08/2011   Backache 01/29/2009   Osteopenia 10/16/2007   Essential hypertension 01/29/2007   Asthma 01/29/2007    Past Surgical History:  Procedure Laterality Date   CHOLECYSTECTOMY N/A 09/19/2015   Procedure: LAPAROSCOPIC CHOLECYSTECTOMY WITH INTRAOPERATIVE CHOLANGIOGRAM;  Surgeon: Jackolyn Confer, MD;  Location: [facility];  Service: General;  Laterality: N/A;   COLONOSCOPY     TONSILLECTOMY  1956   VIDEO BRONCHOSCOPY Bilateral 03/28/2014   Procedure: VIDEO BRONCHOSCOPY WITHOUT FLUORO;  Surgeon: Tanda Rockers, MD;  Location: [facility];  Service: Cardiopulmonary;  Laterality: Bilateral;     OB History     Gravida  5   Para  5   Term  5   Preterm      AB      Living  5      SAB      IAB      Ectopic      Multiple      Live Births  5           Family History  Problem Relation Age of Onset   Cancer Mother        ? type    Diabetes Father    Hypertension Father    Heart disease Father    Asthma Father    Asthma Daughter    Colon cancer Neg Hx    Esophageal cancer Neg Hx  Rectal cancer Neg Hx    Stomach cancer Neg Hx    Breast cancer Neg Hx     Social History   Tobacco Use   Smoking status: Former    Types: Cigarettes    Quit date: 09/07/2003    Years since quitting: 17.1   Smokeless tobacco: Never  Vaping Use   Vaping Use: Never used  Substance Use Topics   Alcohol use: No   Drug use: No    Home Medications Prior to Admission medications   Medication Sig Start Date End Date Taking? Authorizing Provider  albuterol (PROAIR HFA) 108 (90 Base) MCG/ACT inhaler INHALE 2 PUFFS BY MOUTH EVERY 6 HOURS AS NEEDED FOR WHEEZING OR SHORTNESS OF BREATH 01/09/20   Biagio Borg, MD  aspirin 81 MG tablet Take 81 mg by mouth every morning.    [provider]  Blood Glucose Monitoring Suppl (ONE TOUCH ULTRA 2) w/Device KIT Use as directed 07/27/16   Biagio Borg, MD   HUMULIN R U-500 KWIKPEN 500 UNIT/ML kwikpen ADMINISTER 60 UNITS UNDER THE SKIN 30 MINUTES BEFORE MEALS 01/30/20   Elayne Snare, MD  Lancets MISC Use as directed four times daily  E11.9 03/29/17   Biagio Borg, MD  losartan (COZAAR) 50 MG tablet Take 1 tablet (50 mg total) by mouth daily. 01/09/20   Biagio Borg, MD  Multiple Vitamin (MULTIVITAMIN WITH MINERALS) TABS tablet Take 1 tablet by mouth daily.    [provider]  [facility] ULTRA test strip USE TO CHECK BLOOD SUGARS THREE TIMES DAILY 02/26/20   Biagio Borg, MD  [facility] VERIO test strip USE AS DIRECTED THREE TIMES DAILY 09/17/19   Elayne Snare, MD  potassium chloride (KLOR-CON) 10 MEQ tablet TAKE 3 TABLETS(30 MEQ) BY MOUTH DAILY 05/06/20   Biagio Borg, MD  rosuvastatin (CRESTOR) 20 MG tablet TAKE 1 TABLET(20 MG) BY MOUTH DAILY 01/09/20   Biagio Borg, MD  SYNJARDY XR 08-998 MG TB24 TAKE 2 TABLETS BY MOUTH DAILY 08/18/20   Elayne Snare, MD  tiZANidine (ZANAFLEX) 2 MG tablet TAKE 1 TABLET(2 MG) BY MOUTH EVERY 6 HOURS AS NEEDED FOR MUSCLE SPASMS 01/30/20   Biagio Borg, MD  TOUJEO MAX SOLOSTAR 300 UNIT/ML Solostar Pen ADMINISTER 100 UNITS UNDER THE SKIN DAILY BEFORE SUPPER 06/21/20   Elayne Snare, MD    Allergies    Patient has no known allergies.  Review of Systems   Review of Systems  All other systems reviewed and are negative.  Physical Exam Updated Vital Signs BP 115/77 (BP Location: Left Arm)   Pulse (!) 120   Temp 99.3 F (37.4 C) (Oral)   Resp 15   Ht 1.6 m ('5\' 3"' )   Wt 86.2 kg   SpO2 96%   BMI 33.66 kg/m   Physical Exam Vitals and nursing note reviewed.  Constitutional:      Appearance: Normal appearance.  HENT:     Head: Normocephalic.     Right Ear: External ear normal.     Left Ear: External ear normal.     Nose: Nose normal.     Mouth/Throat:     Mouth: Mucous membranes are moist.  Eyes:     Extraocular Movements: Extraocular movements intact.     Pupils: Pupils are equal, round, and reactive to  light.  Cardiovascular:     Rate and Rhythm: Regular rhythm. Tachycardia present.     Pulses: Normal pulses.  Abdominal:     General: Abdomen is  flat. Bowel sounds are normal.  Musculoskeletal:        General: Normal range of motion.     Cervical back: Normal range of motion and neck supple.  Skin:    General: Skin is warm.     Capillary Refill: Capillary refill takes less than 2 seconds.  Neurological:     General: No focal deficit present.     Mental Status: She is alert and oriented to person, place, and time.  Psychiatric:        Mood and Affect: Mood normal.        Behavior: Behavior normal.    ED Results / Procedures / Treatments   Labs (all labs ordered are listed, but only abnormal results are displayed) Labs Reviewed  LIPASE, BLOOD  COMPREHENSIVE METABOLIC PANEL  CBC  URINALYSIS, ROUTINE W REFLEX MICROSCOPIC    EKG EKG Interpretation  Date/Time:  Sunday November 09 2020 20:49:48 EDT Ventricular Rate:  94 PR Interval:  171 QRS Duration: 86 QT Interval:  361 QTC Calculation: 452 R Axis:   -46 Text Interpretation: Sinus rhythm Left anterior fascicular block Low voltage, precordial leads Borderline T abnormalities, anterior leads Baseline wander in lead(s) I Confirmed by Pattricia Boss [id number]) on 11/09/2020 10:48:07 PM  Radiology CT ABDOMEN PELVIS W CONTRAST  Result Date: 11/09/2020 CLINICAL DATA:  Left-sided flank pain, nausea/vomiting/diarrhea for 1 hour EXAM: CT ABDOMEN AND PELVIS WITH CONTRAST TECHNIQUE: Multidetector CT imaging of the abdomen and pelvis was performed using the standard protocol following bolus administration of intravenous contrast. CONTRAST:  29m OMNIPAQUE IOHEXOL 350 MG/ML SOLN COMPARISON:  08/05/2015 FINDINGS: Lower chest: No acute pleural or parenchymal lung disease. Hepatobiliary: No focal liver abnormality is seen. Status post cholecystectomy. No biliary dilatation. Pancreas: Unremarkable. No pancreatic ductal dilatation or surrounding  inflammatory changes. Spleen: Normal in size without focal abnormality. Adrenals/Urinary Tract: Stable scarring right kidney. Otherwise the kidneys enhance normally and symmetrically. No urinary tract calculi or obstructive uropathy. The adrenals and bladder are unremarkable. Stomach/Bowel: No bowel obstruction or ileus. Normal appendix right lower quadrant. No bowel wall thickening or inflammatory change. Vascular/Lymphatic: Aortic atherosclerosis. No enlarged abdominal or pelvic lymph nodes. Reproductive: Uterus and bilateral adnexa are unremarkable. Other: No free fluid or free gas.  No abdominal wall hernia. Musculoskeletal: No acute or destructive bony lesions. Reconstructed images demonstrate no additional findings. IMPRESSION: 1. No acute intra-abdominal or intrapelvic process. Electronically Signed   By: MRanda NgoM.D.   On: 11/09/2020 21:31   DG Chest Port 1 View  Result Date: 11/09/2020 CLINICAL DATA:  Left-sided flank pain for 1 hour, nausea/vomiting/diarrhea, tobacco abuse EXAM: PORTABLE CHEST 1 VIEW COMPARISON:  06/29/2018, 07/10/2018 FINDINGS: Single frontal view of the chest demonstrates a stable cardiac silhouette. There is chronic atelectasis of the left upper and right middle lobes. No acute airspace disease, effusion, or pneumothorax. No acute bony abnormalities. IMPRESSION: 1. Stable left upper and right middle lobe atelectasis. 2. No acute intrathoracic process. Electronically Signed   By: MRanda NgoM.D.   On: 11/09/2020 20:25    Procedures Procedures   Medications Ordered in ED Medications - No data to display  ED Course  I have reviewed the triage vital signs and the nursing notes.  Pertinent labs & imaging results that were available during my care of the patient were reviewed by me and considered in my medical decision making (see chart for details).    MDM Rules/Calculators/A&P  70 year old female who presents today complaining of 2  episodes of nausea vomiting, and 2 episodes of diarrhea in the hour prior to evaluation.  She had some associated left flank pain.  Here she has labs that are within normal limits the exception of blood sugar mildly elevated at 150 and creatinine at 1.11.  CBC is essentially within normal limits.  CT of the abdomen pelvis showed no evidence of acute abnormality.  Patient received IV fluids and antiemetics here.  She has not had any further episodes of vomiting or diarrhea.  She appears stable for discharge.  We have discussed return precautions and need for follow-up and she voices understanding. Final Clinical Impression(s) / ED Diagnoses Final diagnoses:  Nausea vomiting and diarrhea    Rx / DC Orders ED Discharge Orders     None        Pattricia Boss, MD 11/09/20 2249

## 2020-11-09 NOTE — ED Triage Notes (Signed)
Pt reports left sided flank pain and N/V/D that began about an hour ago.

## 2020-11-12 ENCOUNTER — Other Ambulatory Visit: Payer: Self-pay | Admitting: Internal Medicine

## 2020-11-12 ENCOUNTER — Other Ambulatory Visit: Payer: Self-pay | Admitting: Endocrinology

## 2021-01-05 ENCOUNTER — Other Ambulatory Visit: Payer: Self-pay | Admitting: Internal Medicine

## 2021-01-05 DIAGNOSIS — J069 Acute upper respiratory infection, unspecified: Secondary | ICD-10-CM

## 2021-01-05 MED ORDER — ROSUVASTATIN CALCIUM 20 MG PO TABS: ORAL_TABLET | ORAL | 1 refills | Status: DC

## 2021-01-05 MED ORDER — LOSARTAN POTASSIUM 50 MG PO TABS: 50.0000 mg | ORAL_TABLET | Freq: Every day | ORAL | 1 refills | Status: DC

## 2021-01-05 MED ORDER — ALBUTEROL SULFATE HFA 108 (90 BASE) MCG/ACT IN AERS: INHALATION_SPRAY | RESPIRATORY_TRACT | 5 refills | Status: DC

## 2021-01-05 MED ORDER — SYNJARDY XR 5-1000 MG PO TB24: 2.0000 | ORAL_TABLET | Freq: Every day | ORAL | 1 refills | Status: DC

## 2021-01-05 MED ORDER — POTASSIUM CHLORIDE ER 10 MEQ PO TBCR: EXTENDED_RELEASE_TABLET | ORAL | 1 refills | Status: DC

## 2021-01-27 ENCOUNTER — Other Ambulatory Visit: Payer: Self-pay

## 2021-01-27 NOTE — Telephone Encounter (Signed)
Received paperwork from [facility] to send Rx for Humulin R and Toujeo Max. Called patient to confirm and she said yes. When sending Rx patient has not been seen and Dwyane Dee wanted her to come back in July. Called patient to inform her she needs to schedule appt before Rx can be sent. Left Vm

## 2021-02-10 ENCOUNTER — Telehealth: Payer: Self-pay

## 2021-02-10 DIAGNOSIS — E1165 Type 2 diabetes mellitus with hyperglycemia: Secondary | ICD-10-CM

## 2021-02-10 MED ORDER — HUMULIN R U-500 KWIKPEN 500 UNIT/ML ~~LOC~~ SOPN: PEN_INJECTOR | SUBCUTANEOUS | 3 refills | Status: DC

## 2021-02-10 MED ORDER — TOUJEO MAX SOLOSTAR 300 UNIT/ML ~~LOC~~ SOPN: PEN_INJECTOR | SUBCUTANEOUS | 2 refills | Status: DC

## 2021-02-10 NOTE — Telephone Encounter (Signed)
Received fax from [facility]. States that pt opted in to their pharmacy. Per patient she doesn't want to switch she would like to stay at [facility]. I have sent refills to [facility] pharmacy.

## 2021-02-13 ENCOUNTER — Other Ambulatory Visit: Payer: Self-pay | Admitting: Internal Medicine

## 2021-02-13 NOTE — Telephone Encounter (Signed)
Please refill as per office routine med refill policy (all routine meds to be refilled for 3 mo or monthly (per pt preference) up to one year from last visit, then month to month grace period for 3 mo, then further med refills will have to be denied) ? ?

## 2021-02-16 ENCOUNTER — Other Ambulatory Visit: Payer: Self-pay | Admitting: Internal Medicine

## 2021-02-16 DIAGNOSIS — Z1231 Encounter for screening mammogram for malignant neoplasm of breast: Secondary | ICD-10-CM

## 2021-03-09 ENCOUNTER — Telehealth: Payer: Self-pay | Admitting: Internal Medicine

## 2021-03-09 NOTE — Telephone Encounter (Signed)
Patient scheduled 03/10/21 with [name]

## 2021-03-09 NOTE — Telephone Encounter (Signed)
Pt. Connected to [facility]. States she is having chest pains when she coughs.    Advised to see PCP within 24 hours. No appt. Available. Attempted to schedule pt. With another provider at another office, pt. States she doesn't drive and wouldn't have a ride.    Please advise as to where pt. Should be scheduled on provider schedule, if possible.

## 2021-03-09 NOTE — Telephone Encounter (Signed)
Patient requesting appt for chest pains x2d, patient states she did not have any other symptoms  Advised patient due to the chest pain I would transfer her to the triage nurse  Patient transferred to [facility]

## 2021-03-09 NOTE — Telephone Encounter (Signed)
All I can say is next available, or go to UC or ED for any worsening, such as fever, chills, sob or worsening pain

## 2021-03-10 ENCOUNTER — Ambulatory Visit (INDEPENDENT_AMBULATORY_CARE_PROVIDER_SITE_OTHER): Payer: Medicare Other | Admitting: Internal Medicine

## 2021-03-10 ENCOUNTER — Encounter: Payer: Self-pay | Admitting: Internal Medicine

## 2021-03-10 ENCOUNTER — Other Ambulatory Visit: Payer: Self-pay

## 2021-03-10 VITALS — BP 134/80 | HR 103 | Temp 100.0°F | Ht 63.0 in | Wt 173.0 lb

## 2021-03-10 DIAGNOSIS — Z23 Encounter for immunization: Secondary | ICD-10-CM

## 2021-03-10 DIAGNOSIS — I10 Essential (primary) hypertension: Secondary | ICD-10-CM | POA: Diagnosis not present

## 2021-03-10 DIAGNOSIS — E7800 Pure hypercholesterolemia, unspecified: Secondary | ICD-10-CM

## 2021-03-10 DIAGNOSIS — R059 Cough, unspecified: Secondary | ICD-10-CM

## 2021-03-10 DIAGNOSIS — E119 Type 2 diabetes mellitus without complications: Secondary | ICD-10-CM

## 2021-03-10 LAB — BASIC METABOLIC PANEL
BUN: 18 mg/dL (ref 6–23)
CO2: 34 mEq/L — ABNORMAL HIGH (ref 19–32)
Calcium: 10 mg/dL (ref 8.4–10.5)
Chloride: 99 mEq/L (ref 96–112)
Creatinine, Ser: 1.02 mg/dL (ref 0.40–1.20)
GFR: 56.05 mL/min — ABNORMAL LOW (ref >60.00)
Glucose, Bld: 230 mg/dL — ABNORMAL HIGH (ref 70–99)
Potassium: 3.2 mEq/L — ABNORMAL LOW (ref 3.5–5.1)
Sodium: 141 mEq/L (ref 135–145)

## 2021-03-10 LAB — HEMOGLOBIN A1C: Hgb A1c MFr Bld: 10.9 % — ABNORMAL HIGH (ref 4.6–6.5)

## 2021-03-10 LAB — LIPID PANEL
Cholesterol: 124 mg/dL (ref 0–200)
HDL: 36.4 mg/dL — ABNORMAL LOW (ref >39.00)
LDL Cholesterol: 57 mg/dL (ref 0–99)
NonHDL: 87.85
Total CHOL/HDL Ratio: 3
Triglycerides: 154 mg/dL — ABNORMAL HIGH (ref 0.0–149.0)
VLDL: 30.8 mg/dL (ref 0.0–40.0)

## 2021-03-10 LAB — POC COVID19 BINAXNOW: SARS Coronavirus 2 Ag: NEGATIVE

## 2021-03-10 LAB — HEPATIC FUNCTION PANEL
ALT: 28 U/L (ref 0–35)
AST: 26 U/L (ref 0–37)
Albumin: 4.2 g/dL (ref 3.5–5.2)
Alkaline Phosphatase: 81 U/L (ref 39–117)
Bilirubin, Direct: 0.1 mg/dL (ref 0.0–0.3)
Total Bilirubin: 0.3 mg/dL (ref 0.2–1.2)
Total Protein: 7.4 g/dL (ref 6.0–8.3)

## 2021-03-10 MED ORDER — HYDROCODONE BIT-HOMATROP MBR 5-1.5 MG/5ML PO SOLN: 5.0000 mL | Freq: Four times a day (QID) | ORAL | 0 refills | Status: AC | PRN

## 2021-03-10 MED ORDER — DOXYCYCLINE HYCLATE 100 MG PO TABS: 100.0000 mg | ORAL_TABLET | Freq: Two times a day (BID) | ORAL | 0 refills | Status: DC

## 2021-03-10 NOTE — Patient Instructions (Signed)
You had the flu shot today  Your covid test was negative today, but please check this again at home in 2 days  Please take all new medication as prescribed - the cough medicine, and antibiotic  Please continue all other medications as before, and refills have been done if requested.  Please have the pharmacy call with any other refills you may need.  Please continue your efforts at being more active, low cholesterol diet, and weight control.  Please keep your appointments with your specialists as you may have planned  Please go to the LAB at the blood drawing area for the tests to be done  You will be contacted by phone if any changes need to be made immediately.  Otherwise, you will receive a letter about your results with an explanation, but please check with MyChart first.  Please remember to sign up for MyChart if you have not done so, as this will be important to you in the future with finding out test results, communicating by private email, and scheduling acute appointments online when needed.  Please make an Appointment to return in 6 months, or sooner if needed

## 2021-03-10 NOTE — Progress Notes (Signed)
Patient ID: Deborah Neal, female   DOB: Sep 13, 1950, 70 y.o.   MRN: [id number]        Chief Complaint: follow up HTN, HLD and hyperglycemia, and cough       HPI:  Deborah Neal is a 70 y.o. female Here with acute onset mild to mod 2-3 days ST, HA, general weakness and malaise, with prod cough greenish sputum, but Pt denies chest pain, increased sob or doe, wheezing, orthopnea, PND, increased LE swelling, palpitations, dizziness or syncope.   Pt denies polydipsia, polyuria, or new focal neuro s/s.   Pt denies wt loss, night sweats, loss of appetite, or other constitutional symptoms  Due for flu shot today       Wt Readings from Last 3 Encounters:  03/10/21 173 lb (78.5 kg)  11/09/20 190 lb (86.2 kg)  08/26/20 188 lb 6.4 oz (85.5 kg)   BP Readings from Last 3 Encounters:  03/10/21 134/80  11/09/20 (!) 149/87  08/26/20 (!) 170/98         Past Medical History:  Diagnosis Date   ALLERGIC RHINITIS 01/29/2007   Arthritis    ASTHMA 01/29/2007   ASTHMA, WITH ACUTE EXACERBATION 06/24/2008   BACK PAIN 01/29/2009   CHEST PAIN 06/24/2008   Family history of adverse reaction to anesthesia    Patients daughter has N/V after anesthesia   GERD (gastroesophageal reflux disease)    History of shingles    HYPERLIPIDEMIA 01/29/2007   HYPERTENSION 01/29/2007   OSTEOPENIA 10/16/2007   Type II or unspecified type diabetes mellitus without mention of complication, uncontrolled 11/29/2013   Past Surgical History:  Procedure Laterality Date   CHOLECYSTECTOMY N/A 09/19/2015   Procedure: LAPAROSCOPIC CHOLECYSTECTOMY WITH INTRAOPERATIVE CHOLANGIOGRAM;  Surgeon: Jackolyn Confer, MD;  Location: [facility];  Service: General;  Laterality: N/A;   COLONOSCOPY     TONSILLECTOMY  1956   VIDEO BRONCHOSCOPY Bilateral 03/28/2014   Procedure: VIDEO BRONCHOSCOPY WITHOUT FLUORO;  Surgeon: Tanda Rockers, MD;  Location: [facility];  Service: Cardiopulmonary;  Laterality: Bilateral;    reports that she quit smoking about 17 years ago.  Her smoking use included cigarettes. She has never used smokeless tobacco. She reports that she does not drink alcohol and does not use drugs. family history includes Asthma in her daughter and father; Cancer in her mother; Diabetes in her father; Heart disease in her father; Hypertension in her father. No Known Allergies Current Outpatient Medications on File Prior to Visit  Medication Sig Dispense Refill   albuterol (PROAIR HFA) 108 (90 Base) MCG/ACT inhaler INHALE 2 PUFFS BY MOUTH EVERY 6 HOURS AS NEEDED FOR WHEEZING OR SHORTNESS OF BREATH 8.5 g 5   aspirin 81 MG tablet Take 81 mg by mouth every morning.     Blood Glucose Monitoring Suppl (ONE TOUCH ULTRA 2) w/Device KIT Use as directed 1 each 0   Empagliflozin-metFORMIN HCl ER (SYNJARDY XR) 08-998 MG TB24 Take 2 tablets by mouth daily. 180 tablet 1   insulin glargine, 2 Unit Dial, (TOUJEO MAX SOLOSTAR) 300 UNIT/ML Solostar Pen ADMINISTER 100 UNITS UNDER THE SKIN DAILY BEFORE AND SUPPER 6 mL 2   insulin regular human CONCENTRATED (HUMULIN R U-500 KWIKPEN) 500 UNIT/ML KwikPen ADMINISTER 60 UNITS UNDER THE SKIN 30 MINUTES BEFORE MEALS 6 mL 3   Lancets MISC Use as directed four times daily  E11.9 400 each 11   losartan (COZAAR) 50 MG tablet Take 1 tablet (50 mg total) by mouth daily. 90 tablet 1   Multiple Vitamin (  MULTIVITAMIN WITH MINERALS) TABS tablet Take 1 tablet by mouth daily.     ONETOUCH ULTRA test strip USE TO CHECK BLOOD SUGARS THREE TIMES DAILY 300 strip 3   ONETOUCH VERIO test strip USE AS DIRECTED THREE TIMES DAILY 100 strip 4   potassium chloride (KLOR-CON) 10 MEQ tablet TAKE 3 TABLETS(30 MEQ) BY MOUTH DAILY 270 tablet 1   rosuvastatin (CRESTOR) 20 MG tablet TAKE 1 TABLET(20 MG) BY MOUTH DAILY 90 tablet 1   tiZANidine (ZANAFLEX) 2 MG tablet TAKE 1 TABLET(2 MG) BY MOUTH EVERY 6 HOURS AS NEEDED FOR MUSCLE SPASMS 30 tablet 1   No current facility-administered medications on file prior to visit.        ROS:  All others reviewed and  negative.  Objective        PE:  BP 134/80 (BP Location: Right Arm, Patient Position: Sitting, Cuff Size: Large)   Pulse (!) 103   Temp 100 F (37.8 C) (Oral)   Ht _0  (1.6 m)   Wt 173 lb (78.5 kg)   SpO2 98%   BMI 30.65 kg/m                 Constitutional: Pt appears in NAD               HENT: Head: NCAT.                Right Ear: External ear normal.                 Left Ear: External ear normal.                Eyes: . Pupils are equal, round, and reactive to light. Conjunctivae and EOM are normal               Nose: without d/c or deformity               Neck: Neck supple. Gross normal ROM               Cardiovascular: Normal rate and regular rhythm.                 Pulmonary/Chest: Effort normal and breath sounds without rales or wheezing.                Abd:  Soft, NT, ND, + BS, no organomegaly               Neurological: Pt is alert. At baseline orientation, motor grossly intact               Skin: Skin is warm. No rashes, no other new lesions, LE edema - none               Psychiatric: Pt behavior is normal without agitation   Micro: none  Cardiac tracings I have personally interpreted today:  none  Pertinent Radiological findings (summarize): none   Lab Results  Component Value Date   WBC 8.7 11/09/2020   HGB 13.2 11/09/2020   HCT 43.6 11/09/2020   PLT 255 11/09/2020   GLUCOSE 230 (H) 03/10/2021   CHOL 124 03/10/2021   TRIG 154.0 (H) 03/10/2021   HDL 36.40 (L) 03/10/2021   LDLDIRECT 153.4 01/29/2009   LDLCALC 57 03/10/2021   ALT 28 03/10/2021   AST 26 03/10/2021   NA 141 03/10/2021   K 3.2 (L) 03/10/2021   CL 99 03/10/2021   CREATININE 1.02 03/10/2021   BUN 18 03/10/2021   CO2  34 (H) 03/10/2021   TSH 5.11 (H) 07/08/2020   HGBA1C 10.9 Repeated and verified X2. (H) 03/10/2021   MICROALBUR 1.5 07/08/2020   SARS Coronavirus 2 Ag Negative Negative    Assessment/Plan:  Deborah Neal is a 70 y.o. Black or African American [2] female with  has a past  medical history of ALLERGIC RHINITIS (01/29/2007), Arthritis, ASTHMA (01/29/2007), ASTHMA, WITH ACUTE EXACERBATION (06/24/2008), BACK PAIN (01/29/2009), CHEST PAIN (06/24/2008), Family history of adverse reaction to anesthesia, GERD (gastroesophageal reflux disease), History of shingles, HYPERLIPIDEMIA (01/29/2007), HYPERTENSION (01/29/2007), OSTEOPENIA (10/16/2007), and Type II or unspecified type diabetes mellitus without mention of complication, uncontrolled (11/29/2013).  Diabetes mellitus type II, non insulin dependent (HCC) Lab Results  Component Value Date   HGBA1C 10.9 Repeated and verified X2. (H) 03/10/2021   Severe uncontrolled likely related to non compliance, pt to continue current medical treatment synjardy, toujeo, and f/u endo as planned   Essential hypertension BP Readings from Last 3 Encounters:  03/10/21 134/80  11/09/20 (!) 149/87  08/26/20 (!) 170/98   Stable improved today, pt to continue medical treatment losartan 50   Cough Mild to mod, c/w bronciitis vs pna, declines cxr, for antibx course, cough med prn, to f/u any worsening symptoms or concerns  HLD (hyperlipidemia) Lab Results  Component Value Date   LDLCALC 57 03/10/2021   Stable, pt to continue current statin crestor 20  Followup: Return in about 6 months (around 09/07/2021).  [name], MD 03/15/2021 4:36 PM [facility]

## 2021-03-11 ENCOUNTER — Encounter: Payer: Self-pay | Admitting: Internal Medicine

## 2021-03-15 ENCOUNTER — Encounter: Payer: Self-pay | Admitting: Internal Medicine

## 2021-03-15 NOTE — Assessment & Plan Note (Signed)
Lab Results  Component Value Date   HGBA1C 10.9 Repeated and verified X2. (H) 03/10/2021   Severe uncontrolled likely related to non compliance, pt to continue current medical treatment synjardy, toujeo, and f/u endo as planned

## 2021-03-15 NOTE — Assessment & Plan Note (Signed)
BP Readings from Last 3 Encounters:  03/10/21 134/80  11/09/20 (!) 149/87  08/26/20 (!) 170/98   Stable improved today, pt to continue medical treatment losartan 50

## 2021-03-15 NOTE — Assessment & Plan Note (Signed)
Mild to mod, c/w bronciitis vs pna, declines cxr, for antibx course, cough med prn, to f/u any worsening symptoms or concerns

## 2021-03-15 NOTE — Assessment & Plan Note (Signed)
Lab Results  Component Value Date   LDLCALC 57 03/10/2021   Stable, pt to continue current statin crestor 20

## 2021-03-27 ENCOUNTER — Ambulatory Visit
Admission: RE | Admit: 2021-03-27 | Discharge: 2021-03-27 | Disposition: A | Discharge status: Home / Self Care | Payer: Medicare Other | Source: Ambulatory Visit | Attending: Internal Medicine | Admitting: Internal Medicine

## 2021-03-27 DIAGNOSIS — Z1231 Encounter for screening mammogram for malignant neoplasm of breast: Secondary | ICD-10-CM

## 2021-04-23 ENCOUNTER — Encounter (HOSPITAL_COMMUNITY): Payer: Self-pay

## 2021-04-23 ENCOUNTER — Emergency Department (HOSPITAL_COMMUNITY): Payer: Medicare Other

## 2021-04-23 ENCOUNTER — Emergency Department (HOSPITAL_COMMUNITY)
Admission: EM | Admit: 2021-04-23 | Discharge: 2021-04-24 | Disposition: A | Discharge status: Home / Self Care | Payer: Medicare Other | Attending: Emergency Medicine | Admitting: Emergency Medicine

## 2021-04-23 DIAGNOSIS — R42 Dizziness and giddiness: Secondary | ICD-10-CM | POA: Diagnosis not present

## 2021-04-23 DIAGNOSIS — G319 Degenerative disease of nervous system, unspecified: Secondary | ICD-10-CM | POA: Diagnosis not present

## 2021-04-23 DIAGNOSIS — R4182 Altered mental status, unspecified: Secondary | ICD-10-CM | POA: Diagnosis present

## 2021-04-23 DIAGNOSIS — R55 Syncope and collapse: Secondary | ICD-10-CM | POA: Insufficient documentation

## 2021-04-23 DIAGNOSIS — Z79899 Other long term (current) drug therapy: Secondary | ICD-10-CM | POA: Insufficient documentation

## 2021-04-23 DIAGNOSIS — Z5321 Procedure and treatment not carried out due to patient leaving prior to being seen by health care provider: Secondary | ICD-10-CM | POA: Diagnosis not present

## 2021-04-23 DIAGNOSIS — R410 Disorientation, unspecified: Secondary | ICD-10-CM | POA: Diagnosis not present

## 2021-04-23 DIAGNOSIS — I6782 Cerebral ischemia: Secondary | ICD-10-CM | POA: Diagnosis not present

## 2021-04-23 DIAGNOSIS — Y900 Blood alcohol level of less than 20 mg/100 ml: Secondary | ICD-10-CM | POA: Insufficient documentation

## 2021-04-23 DIAGNOSIS — R739 Hyperglycemia, unspecified: Secondary | ICD-10-CM | POA: Diagnosis not present

## 2021-04-23 LAB — CBC WITH DIFFERENTIAL/PLATELET
Abs Immature Granulocytes: 0.05 10*3/uL (ref 0.00–0.07)
Basophils Absolute: 0.1 10*3/uL (ref 0.0–0.1)
Basophils Relative: 1 %
Eosinophils Absolute: 0.1 10*3/uL (ref 0.0–0.5)
Eosinophils Relative: 1 %
HCT: 40.5 % (ref 36.0–46.0)
Hemoglobin: 12.5 g/dL (ref 12.0–15.0)
Immature Granulocytes: 1 %
Lymphocytes Relative: 30 %
Lymphs Abs: 2.2 10*3/uL (ref 0.7–4.0)
MCH: 23.1 pg — ABNORMAL LOW (ref 26.0–34.0)
MCHC: 30.9 g/dL (ref 30.0–36.0)
MCV: 74.9 fL — ABNORMAL LOW (ref 80.0–100.0)
Monocytes Absolute: 0.5 10*3/uL (ref 0.1–1.0)
Monocytes Relative: 6 %
Neutro Abs: 4.4 10*3/uL (ref 1.7–7.7)
Neutrophils Relative %: 61 %
Platelets: 271 10*3/uL (ref 150–400)
RBC: 5.41 MIL/uL — ABNORMAL HIGH (ref 3.87–5.11)
RDW: 15 % (ref 11.5–15.5)
WBC: 7.3 10*3/uL (ref 4.0–10.5)
nRBC: 0 % (ref 0.0–0.2)

## 2021-04-23 LAB — COMPREHENSIVE METABOLIC PANEL
ALT: 18 U/L (ref 0–44)
AST: 20 U/L (ref 15–41)
Albumin: 4.1 g/dL (ref 3.5–5.0)
Alkaline Phosphatase: 103 U/L (ref 38–126)
Anion gap: 8 (ref 5–15)
BUN: 25 mg/dL — ABNORMAL HIGH (ref 8–23)
CO2: 24 mmol/L (ref 22–32)
Calcium: 9.5 mg/dL (ref 8.9–10.3)
Chloride: 101 mmol/L (ref 98–111)
Creatinine, Ser: 1.8 mg/dL — ABNORMAL HIGH (ref 0.44–1.00)
GFR, Estimated: 30 mL/min — ABNORMAL LOW (ref >60)
Glucose, Bld: 375 mg/dL — ABNORMAL HIGH (ref 70–99)
Potassium: 3.3 mmol/L — ABNORMAL LOW (ref 3.5–5.1)
Sodium: 133 mmol/L — ABNORMAL LOW (ref 135–145)
Total Bilirubin: 0.3 mg/dL (ref 0.3–1.2)
Total Protein: 7.6 g/dL (ref 6.5–8.1)

## 2021-04-23 LAB — ETHANOL: Alcohol, Ethyl (B): <10 mg/dL (ref <10)

## 2021-04-23 LAB — CBG MONITORING, ED
Glucose-Capillary: 430 mg/dL — ABNORMAL HIGH (ref 70–99)
Glucose-Capillary: 414 mg/dL — ABNORMAL HIGH (ref 70–99)

## 2021-04-23 LAB — TROPONIN I (HIGH SENSITIVITY): Troponin I (High Sensitivity): 5 ng/L (ref <18)

## 2021-04-23 NOTE — ED Provider Notes (Signed)
Emergency Medicine Provider Triage Evaluation Note  Deborah Neal , a 70 y.o. female  was evaluated in triage.  Pt complains of confusion, lightheadedness, near syncope.  According to her daughter at the bedside the patient has been having episodes of sudden onset lightheadedness, difficulty ambulating, and near syncope throughout the day.  Episodes last several minutes.  Patient also has been confused, disoriented.  In triage patient is well-appearing but is unable to provide any insight or history.  States that she has been feeling well and has not had any problems all day long though her daughter appears very concerned at the bedside.  Review of Systems  Positive: Altered mental status, lightheadedness, near syncope Negative:  Unable to complete ROS due to patient's altered mental status  Physical Exam  BP 109/62 (BP Location: Left Arm)    Pulse 98    Temp 98.3 F (36.8 C) (Oral)    Resp 18    Ht 5\' 3"  (1.6 m)    Wt 81.6 kg    SpO2 100%    BMI 31.89 kg/m  Gen:   Awake, no distress   Resp:  Normal effort  MSK:   Moves extremities without difficulty  Other:  PERRL, EOMI, cranial nerves are intact.  No focal deficit on neuro exam  Medical Decision Making  Medically screening exam initiated at 8:44 PM.  Appropriate orders placed.  LUWANNA BROSSMAN was informed that the remainder of the evaluation will be completed by another provider, this initial triage assessment does not replace that evaluation, and the importance of remaining in the ED until their evaluation is complete.    This chart was dictated using voice recognition software, Dragon. Despite the best efforts of this provider to proofread and correct errors, errors may still occur which can change documentation meaning.    Aura Dials 04/23/21 2050    Davonna Belling, MD 04/24/21 0001

## 2021-04-23 NOTE — ED Triage Notes (Signed)
Patient arrives from home with complaint of confusion that began around 3 pm. Family reports pt had 2 near syncopal episodes today. Denies urinary sx, N/V

## 2021-04-24 LAB — TROPONIN I (HIGH SENSITIVITY): Troponin I (High Sensitivity): 4 ng/L (ref <18)

## 2021-04-28 ENCOUNTER — Other Ambulatory Visit: Payer: Self-pay | Admitting: Internal Medicine

## 2021-07-13 ENCOUNTER — Encounter: Payer: BC Managed Care – PPO | Admitting: Internal Medicine

## 2021-07-22 ENCOUNTER — Encounter: Payer: Self-pay | Admitting: Internal Medicine

## 2021-07-22 ENCOUNTER — Ambulatory Visit (INDEPENDENT_AMBULATORY_CARE_PROVIDER_SITE_OTHER): Payer: Medicaid Other | Admitting: Internal Medicine

## 2021-07-22 VITALS — BP 134/80 | HR 93 | Resp 18 | Ht 63.0 in | Wt 170.0 lb

## 2021-07-22 DIAGNOSIS — E119 Type 2 diabetes mellitus without complications: Secondary | ICD-10-CM

## 2021-07-22 DIAGNOSIS — N289 Disorder of kidney and ureter, unspecified: Secondary | ICD-10-CM | POA: Diagnosis not present

## 2021-07-22 DIAGNOSIS — E538 Deficiency of other specified B group vitamins: Secondary | ICD-10-CM

## 2021-07-22 DIAGNOSIS — I10 Essential (primary) hypertension: Secondary | ICD-10-CM | POA: Diagnosis not present

## 2021-07-22 DIAGNOSIS — E559 Vitamin D deficiency, unspecified: Secondary | ICD-10-CM | POA: Diagnosis not present

## 2021-07-22 DIAGNOSIS — E7800 Pure hypercholesterolemia, unspecified: Secondary | ICD-10-CM | POA: Diagnosis not present

## 2021-07-22 DIAGNOSIS — Z794 Long term (current) use of insulin: Secondary | ICD-10-CM

## 2021-07-22 DIAGNOSIS — E1165 Type 2 diabetes mellitus with hyperglycemia: Secondary | ICD-10-CM

## 2021-07-22 LAB — BASIC METABOLIC PANEL
BUN: 19 mg/dL (ref 6–23)
CO2: 29 mEq/L (ref 19–32)
Calcium: 9.8 mg/dL (ref 8.4–10.5)
Chloride: 103 mEq/L (ref 96–112)
Creatinine, Ser: 0.99 mg/dL (ref 0.40–1.20)
GFR: 57.95 mL/min — ABNORMAL LOW (ref >60.00)
Glucose, Bld: 95 mg/dL (ref 70–99)
Potassium: 2.9 mEq/L — ABNORMAL LOW (ref 3.5–5.1)
Sodium: 143 mEq/L (ref 135–145)

## 2021-07-22 LAB — HEPATIC FUNCTION PANEL
ALT: 23 U/L (ref 0–35)
AST: 22 U/L (ref 0–37)
Albumin: 4.4 g/dL (ref 3.5–5.2)
Alkaline Phosphatase: 82 U/L (ref 39–117)
Bilirubin, Direct: 0.1 mg/dL (ref 0.0–0.3)
Total Bilirubin: 0.3 mg/dL (ref 0.2–1.2)
Total Protein: 7.4 g/dL (ref 6.0–8.3)

## 2021-07-22 LAB — CBC WITH DIFFERENTIAL/PLATELET
Basophils Absolute: 0 10*3/uL (ref 0.0–0.1)
Basophils Relative: 0.6 % (ref 0.0–3.0)
Eosinophils Absolute: 0.1 10*3/uL (ref 0.0–0.7)
Eosinophils Relative: 1.6 % (ref 0.0–5.0)
HCT: 40.2 % (ref 36.0–46.0)
Hemoglobin: 12.7 g/dL (ref 12.0–15.0)
Lymphocytes Relative: 41 % (ref 12.0–46.0)
Lymphs Abs: 2.6 10*3/uL (ref 0.7–4.0)
MCHC: 31.5 g/dL (ref 30.0–36.0)
MCV: 74.7 fl — ABNORMAL LOW (ref 78.0–100.0)
Monocytes Absolute: 0.4 10*3/uL (ref 0.1–1.0)
Monocytes Relative: 6.3 % (ref 3.0–12.0)
Neutro Abs: 3.2 10*3/uL (ref 1.4–7.7)
Neutrophils Relative %: 50.5 % (ref 43.0–77.0)
Platelets: 243 10*3/uL (ref 150.0–400.0)
RBC: 5.38 Mil/uL — ABNORMAL HIGH (ref 3.87–5.11)
RDW: 15.1 % (ref 11.5–15.5)
WBC: 6.3 10*3/uL (ref 4.0–10.5)

## 2021-07-22 LAB — LIPID PANEL
Cholesterol: 229 mg/dL — ABNORMAL HIGH (ref 0–200)
HDL: 59.1 mg/dL (ref >39.00)
LDL Cholesterol: 139 mg/dL — ABNORMAL HIGH (ref 0–99)
NonHDL: 169.62
Total CHOL/HDL Ratio: 4
Triglycerides: 153 mg/dL — ABNORMAL HIGH (ref 0.0–149.0)
VLDL: 30.6 mg/dL (ref 0.0–40.0)

## 2021-07-22 LAB — VITAMIN B12: Vitamin B-12: 1109 pg/mL — ABNORMAL HIGH (ref 211–911)

## 2021-07-22 LAB — MICROALBUMIN / CREATININE URINE RATIO
Creatinine,U: 146.1 mg/dL
Microalb Creat Ratio: 1.8 mg/g (ref 0.0–30.0)
Microalb, Ur: 2.7 mg/dL — ABNORMAL HIGH (ref 0.0–1.9)

## 2021-07-22 LAB — HEMOGLOBIN A1C: Hgb A1c MFr Bld: 13.5 % — ABNORMAL HIGH (ref 4.6–6.5)

## 2021-07-22 LAB — VITAMIN D 25 HYDROXY (VIT D DEFICIENCY, FRACTURES): VITD: 31.51 ng/mL (ref 30.00–100.00)

## 2021-07-22 LAB — TSH: TSH: 4.88 u[IU]/mL (ref 0.35–5.50)

## 2021-07-22 MED ORDER — TOUJEO MAX SOLOSTAR 300 UNIT/ML ~~LOC~~ SOPN: PEN_INJECTOR | SUBCUTANEOUS | 2 refills | Status: DC

## 2021-07-22 MED ORDER — EMPAGLIFLOZIN 25 MG PO TABS: 25.0000 mg | ORAL_TABLET | Freq: Every day | ORAL | 3 refills | Status: DC

## 2021-07-22 NOTE — Progress Notes (Signed)
Patient ID: Deborah Neal, female   DOB: 1950-07-09, 71 y.o.   MRN: [id number] ? ? ? ?     Chief Complaint:: yearly exam ? ?     HPI:  Deborah Neal is a 71 y.o. female here overall doing ok.  Pt denies chest pain, increased sob or doe, wheezing, orthopnea, PND, increased LE swelling, palpitations, dizziness or syncope.  Pt denies new neurological symptoms such as new headache, or facial or extremity weakness or numbness   Pt denies polydipsia, polyuria, or new focal neuro s/s.  Pt denies fever, wt loss, night sweats, loss of appetite, or other constitutional symptoms  Admits to not taking the toujeo but willing to start.  Taking the Regular insulin.  Not taking the synjardy due to intolerance.   ?  ?Wt Readings from Last 3 Encounters:  ?07/22/21 170 lb (77.1 kg)  ?04/23/21 180 lb (81.6 kg)  ?03/10/21 173 lb (78.5 kg)  ? ?BP Readings from Last 3 Encounters:  ?07/22/21 134/80  ?04/24/21 104/75  ?03/10/21 134/80  ? ?Immunization History  ?Administered Date(s) Administered  ? Fluad Quad(high Dose 65+) 02/16/2019, 01/09/2020, 03/10/2021  ? Influenza Split 03/02/2011  ? Influenza Whole 01/29/2009  ? Influenza, High Dose Seasonal PF 02/22/2017  ? Influenza,inj,Quad PF,6+ Mos 05/30/2013, 05/15/2014, 07/14/2015  ? Influenza-Unspecified 05/27/2016, 11/24/2017  ? Pneumococcal Conjugate-13 12/13/2017  ? Pneumococcal Polysaccharide-23 02/22/2017  ? Td 01/29/2009  ? Tdap 07/04/2019  ? Zoster, Live 09/19/2012  ? ?There are no preventive care reminders to display for this patient. ? ?  ? ?Past Medical History:  ?Diagnosis Date  ? ALLERGIC RHINITIS 01/29/2007  ? Arthritis   ? ASTHMA 01/29/2007  ? ASTHMA, WITH ACUTE EXACERBATION 06/24/2008  ? BACK PAIN 01/29/2009  ? CHEST PAIN 06/24/2008  ? Family history of adverse reaction to anesthesia   ? Patients daughter has N/V after anesthesia  ? GERD (gastroesophageal reflux disease)   ? History of shingles   ? HYPERLIPIDEMIA 01/29/2007  ? HYPERTENSION 01/29/2007  ? OSTEOPENIA 10/16/2007  ? Type II or  unspecified type diabetes mellitus without mention of complication, uncontrolled 11/29/2013  ? ?Past Surgical History:  ?Procedure Laterality Date  ? CHOLECYSTECTOMY N/A 09/19/2015  ? Procedure: LAPAROSCOPIC CHOLECYSTECTOMY WITH INTRAOPERATIVE CHOLANGIOGRAM;  Surgeon: Jackolyn Confer, MD;  Location: [place];  Service: General;  Laterality: N/A;  ? COLONOSCOPY    ? TONSILLECTOMY  1956  ? VIDEO BRONCHOSCOPY Bilateral 03/28/2014  ? Procedure: VIDEO BRONCHOSCOPY WITHOUT FLUORO;  Surgeon: Tanda Rockers, MD;  Location: [facility];  Service: Cardiopulmonary;  Laterality: Bilateral;  ? ? reports that she quit smoking about 17 years ago. Her smoking use included cigarettes. She has never used smokeless tobacco. She reports that she does not drink alcohol and does not use drugs. ?family history includes Asthma in her daughter and father; Cancer in her mother; Diabetes in her father; Heart disease in her father; Hypertension in her father. ?No Known Allergies ?Current Outpatient Medications on File Prior to Visit  ?Medication Sig Dispense Refill  ? albuterol (PROAIR HFA) 108 (90 Base) MCG/ACT inhaler INHALE 2 PUFFS BY MOUTH EVERY 6 HOURS AS NEEDED FOR WHEEZING OR SHORTNESS OF BREATH 8.5 g 5  ? aspirin 81 MG tablet Take 81 mg by mouth every morning.    ? Blood Glucose Monitoring Suppl (ONE TOUCH ULTRA 2) w/Device KIT Use as directed 1 each 0  ? insulin regular human CONCENTRATED (HUMULIN R U-500 KWIKPEN) 500 UNIT/ML KwikPen ADMINISTER 60 UNITS UNDER THE SKIN 30 MINUTES BEFORE  MEALS 6 mL 3  ? Lancets MISC Use as directed four times daily  E11.9 400 each 11  ? losartan (COZAAR) 50 MG tablet Take 1 tablet (50 mg total) by mouth daily. 90 tablet 1  ? Multiple Vitamin (MULTIVITAMIN WITH MINERALS) TABS tablet Take 1 tablet by mouth daily.    ? ONETOUCH VERIO test strip USE AS DIRECTED THREE TIMES DAILY 100 strip 4  ? ONETOUCH VERIO test strip USE TO CHECK BLOOD SUGARS THREE TIMES DAILY 300 strip 3  ? potassium chloride (KLOR-CON) 10 MEQ  tablet TAKE 3 TABLETS(30 MEQ) BY MOUTH DAILY 270 tablet 1  ? rosuvastatin (CRESTOR) 20 MG tablet TAKE 1 TABLET(20 MG) BY MOUTH DAILY 90 tablet 1  ? tiZANidine (ZANAFLEX) 2 MG tablet TAKE 1 TABLET(2 MG) BY MOUTH EVERY 6 HOURS AS NEEDED FOR MUSCLE SPASMS 30 tablet 1  ? ?No current facility-administered medications on file prior to visit.  ? ?     ROS:  All others reviewed and negative. ? ?Objective  ? ?     PE:  BP 134/80   Pulse 93   Resp 18   Ht '5\' 3"'  (1.6 m)   Wt 170 lb (77.1 kg)   SpO2 98%   BMI 30.11 kg/m?  ? ?              Constitutional: Pt appears in NAD ?              HENT: Head: NCAT.  ?              Right Ear: External ear normal.   ?              Left Ear: External ear normal.  ?              Eyes: . Pupils are equal, round, and reactive to light. Conjunctivae and EOM are normal ?              Nose: without d/c or deformity ?              Neck: Neck supple. Gross normal ROM ?              Cardiovascular: Normal rate and regular rhythm.   ?              Pulmonary/Chest: Effort normal and breath sounds without rales or wheezing.  ?              Abd:  Soft, NT, ND, + BS, no organomegaly ?              Neurological: Pt is alert. At baseline orientation, motor grossly intact ?              Skin: Skin is warm. No rashes, no other new lesions, LE edema -none ?              Psychiatric: Pt behavior is normal without agitation  ? ?Micro: none ? ?Cardiac tracings I have personally interpreted today:  none ? ?Pertinent Radiological findings (summarize): none  ? ?Lab Results  ?Component Value Date  ? WBC 7.3 04/23/2021  ? HGB 12.5 04/23/2021  ? HCT 40.5 04/23/2021  ? PLT 271 04/23/2021  ? GLUCOSE 375 (H) 04/23/2021  ? CHOL 124 03/10/2021  ? TRIG 154.0 (H) 03/10/2021  ? HDL 36.40 (L) 03/10/2021  ? LDLDIRECT 153.4 01/29/2009  ? [place] 57 03/10/2021  ? ALT 18 04/23/2021  ? AST 20 04/23/2021  ? NA  133 (L) 04/23/2021  ? K 3.3 (L) 04/23/2021  ? CL 101 04/23/2021  ? CREATININE 1.80 (H) 04/23/2021  ? BUN 25 (H)  04/23/2021  ? CO2 24 04/23/2021  ? TSH 5.11 (H) 07/08/2020  ? HGBA1C 10.9 Repeated and verified X2. (H) 03/10/2021  ? MICROALBUR 1.5 07/08/2020  ? ?Assessment/Plan:  ?Deborah Neal is a 71 y.o. Black or African American [2] female with  has a past medical history of ALLERGIC RHINITIS (01/29/2007), Arthritis, ASTHMA (01/29/2007), ASTHMA, WITH ACUTE EXACERBATION (06/24/2008), BACK PAIN (01/29/2009), CHEST PAIN (06/24/2008), Family history of adverse reaction to anesthesia, GERD (gastroesophageal reflux disease), History of shingles, HYPERLIPIDEMIA (01/29/2007), HYPERTENSION (01/29/2007), OSTEOPENIA (10/16/2007), and Type II or unspecified type diabetes mellitus without mention of complication, uncontrolled (11/29/2013). ? ?Renal insufficiency ?For f/u renal lab, suspect has CKD ? ?Lab Results  ?Component Value Date  ? CREATININE 1.80 (H) 04/23/2021  ? ? ? ?HLD (hyperlipidemia) ?Lab Results  ?Component Value Date  ? [place] 57 03/10/2021  ? ?Stable, pt to continue current statin crestor ? ? ?Essential hypertension ?BP Readings from Last 3 Encounters:  ?07/22/21 134/80  ?04/24/21 104/75  ?03/10/21 134/80  ? ?Stable, pt to continue medical treatment losartan ? ? ?Diabetes mellitus type II, non insulin dependent ([place]) ?Lab Results  ?Component Value Date  ? HGBA1C 10.9 Repeated and verified X2. (H) 03/10/2021  ? ?Uncontrolled with med non compliance, pt to continue current regular insulin, start toujeo, change synjardy to jardiance 25, cont diet ? ?Followup: Return in about 6 months (around 01/22/2022). ? ?[name], MD 07/22/2021 8:59 PM ?[place] ?[place] ?Internal Medicine ?

## 2021-07-22 NOTE — Patient Instructions (Addendum)
Ok to stop the [place] as you have (the one with metformin that you dont want to take) ? ?Please take all new medication as prescribed - the Jardiance 25 mg per day ? ?Please restart the Toujeo insulin ? ?Please continue all other medications as before, and refills have been done if requested. ? ?Please have the pharmacy call with any other refills you may need. ? ?Please continue your efforts at being more active, low cholesterol diet, and weight control. ? ?You are otherwise up to date with prevention measures today. ? ?Please keep your appointments with your specialists as you may have planned - Dr Dwyane Dee ? ?Please go to the LAB at the blood drawing area for the tests to be done ? ?You will be contacted by phone if any changes need to be made immediately.  Otherwise, you will receive a letter about your results with an explanation, but please check with MyChart first. ? ?Please remember to sign up for MyChart if you have not done so, as this will be important to you in the future with finding out test results, communicating by private email, and scheduling acute appointments online when needed. ? ?Please make an Appointment to return in 6 months, or sooner if needed ?

## 2021-07-22 NOTE — Assessment & Plan Note (Signed)
Lab Results  ?Component Value Date  ? [place] 57 03/10/2021  ? ?Stable, pt to continue current statin crestor ? ?

## 2021-07-22 NOTE — Assessment & Plan Note (Signed)
For f/u renal lab, suspect has CKD ? ?Lab Results  ?Component Value Date  ? CREATININE 1.80 (H) 04/23/2021  ? ? ?

## 2021-07-22 NOTE — Assessment & Plan Note (Signed)
BP Readings from Last 3 Encounters:  ?07/22/21 134/80  ?04/24/21 104/75  ?03/10/21 134/80  ? ?Stable, pt to continue medical treatment losartan ? ?

## 2021-07-22 NOTE — Assessment & Plan Note (Signed)
Lab Results  ?Component Value Date  ? HGBA1C 10.9 Repeated and verified X2. (H) 03/10/2021  ? ?Uncontrolled with med non compliance, pt to continue current regular insulin, start toujeo, change synjardy to jardiance 25, cont diet ? ?

## 2021-07-23 LAB — URINALYSIS, ROUTINE W REFLEX MICROSCOPIC
Bilirubin Urine: NEGATIVE
Hgb urine dipstick: NEGATIVE
Leukocytes,Ua: NEGATIVE
Nitrite: NEGATIVE
RBC / HPF: NONE SEEN (ref 0–?)
Specific Gravity, Urine: 1.01 (ref 1.000–1.030)
Total Protein, Urine: NEGATIVE
Urine Glucose: >=1000 — AB
Urobilinogen, UA: 0.2 (ref 0.0–1.0)
pH: 6 (ref 5.0–8.0)

## 2021-07-26 ENCOUNTER — Encounter: Payer: Self-pay | Admitting: Internal Medicine

## 2021-07-27 ENCOUNTER — Encounter: Payer: Medicare Other | Admitting: Internal Medicine

## 2021-09-23 ENCOUNTER — Other Ambulatory Visit: Payer: Self-pay

## 2021-10-19 ENCOUNTER — Encounter: Payer: Self-pay | Admitting: Gastroenterology

## 2022-01-22 ENCOUNTER — Ambulatory Visit (INDEPENDENT_AMBULATORY_CARE_PROVIDER_SITE_OTHER): Payer: Medicare PPO | Admitting: Internal Medicine

## 2022-01-22 ENCOUNTER — Encounter: Payer: Self-pay | Admitting: Internal Medicine

## 2022-01-22 VITALS — BP 152/86 | HR 90 | Temp 97.5°F | Ht 63.0 in | Wt 160.0 lb

## 2022-01-22 DIAGNOSIS — E538 Deficiency of other specified B group vitamins: Secondary | ICD-10-CM

## 2022-01-22 DIAGNOSIS — Z0001 Encounter for general adult medical examination with abnormal findings: Secondary | ICD-10-CM | POA: Diagnosis not present

## 2022-01-22 DIAGNOSIS — E119 Type 2 diabetes mellitus without complications: Secondary | ICD-10-CM | POA: Diagnosis not present

## 2022-01-22 DIAGNOSIS — Z794 Long term (current) use of insulin: Secondary | ICD-10-CM

## 2022-01-22 DIAGNOSIS — J069 Acute upper respiratory infection, unspecified: Secondary | ICD-10-CM

## 2022-01-22 DIAGNOSIS — E1165 Type 2 diabetes mellitus with hyperglycemia: Secondary | ICD-10-CM

## 2022-01-22 DIAGNOSIS — E559 Vitamin D deficiency, unspecified: Secondary | ICD-10-CM | POA: Insufficient documentation

## 2022-01-22 DIAGNOSIS — Z86010 Personal history of colonic polyps: Secondary | ICD-10-CM

## 2022-01-22 DIAGNOSIS — I10 Essential (primary) hypertension: Secondary | ICD-10-CM | POA: Diagnosis not present

## 2022-01-22 DIAGNOSIS — Z23 Encounter for immunization: Secondary | ICD-10-CM | POA: Diagnosis not present

## 2022-01-22 DIAGNOSIS — E7800 Pure hypercholesterolemia, unspecified: Secondary | ICD-10-CM

## 2022-01-22 MED ORDER — POTASSIUM CHLORIDE ER 10 MEQ PO TBCR: EXTENDED_RELEASE_TABLET | ORAL | 3 refills | Status: DC

## 2022-01-22 MED ORDER — TOUJEO MAX SOLOSTAR 300 UNIT/ML ~~LOC~~ SOPN: PEN_INJECTOR | SUBCUTANEOUS | 5 refills | Status: DC

## 2022-01-22 MED ORDER — HUMULIN R U-500 KWIKPEN 500 UNIT/ML ~~LOC~~ SOPN: PEN_INJECTOR | SUBCUTANEOUS | 3 refills | Status: DC

## 2022-01-22 MED ORDER — ROSUVASTATIN CALCIUM 20 MG PO TABS: ORAL_TABLET | ORAL | 3 refills | Status: DC

## 2022-01-22 MED ORDER — ALBUTEROL SULFATE HFA 108 (90 BASE) MCG/ACT IN AERS: INHALATION_SPRAY | RESPIRATORY_TRACT | 5 refills | Status: DC

## 2022-01-22 MED ORDER — EMPAGLIFLOZIN 25 MG PO TABS: 25.0000 mg | ORAL_TABLET | Freq: Every day | ORAL | 3 refills | Status: DC

## 2022-01-22 MED ORDER — LOSARTAN POTASSIUM 100 MG PO TABS: 100.0000 mg | ORAL_TABLET | Freq: Every day | ORAL | 3 refills | Status: DC

## 2022-01-22 NOTE — Progress Notes (Signed)
Patient ID: Deborah Neal, female   DOB: 1950/11/07, 71 y.o.   MRN: [id number]         Chief Complaint:: wellness exam and urinary leakage,        HPI:  Deborah Neal is a 71 y.o. female here for wellness exam; due for eye exam referral; decliens shingrix, ok for colonoscopy, but ok for flu shot today, o/w up to date                        Also Pt denies chest pain, increased sob or doe, wheezing, orthopnea, PND, increased LE swelling, palpitations, dizziness or syncope.   Pt denies polydipsia, polyuria, or new focal neuro s/s.    Pt denies fever, wt loss, night sweats, loss of appetite, or other constitutional symptoms  Not taking Vit D.  BP at home has been mild elevated as well.  Wondering if she can get the dexcom g7   Wt Readings from Last 3 Encounters:  01/22/22 160 lb (72.6 kg)  07/22/21 170 lb (77.1 kg)  04/23/21 180 lb (81.6 kg)   BP Readings from Last 3 Encounters:  01/22/22 (!) 152/86  07/22/21 134/80  04/24/21 104/75   Immunization History  Administered Date(s) Administered   Fluad Quad(high Dose 65+) 02/16/2019, 01/09/2020, 03/10/2021   Influenza Split 03/02/2011   Influenza Whole 01/29/2009   Influenza, High Dose Seasonal PF 02/22/2017   Influenza,inj,Quad PF,6+ Mos 05/30/2013, 05/15/2014, 07/14/2015   Influenza-Unspecified 05/27/2016, 11/24/2017   Pneumococcal Conjugate-13 12/13/2017   Pneumococcal Polysaccharide-23 02/22/2017   Td 01/29/2009   Tdap 07/04/2019   Zoster, Live 09/19/2012   Health Maintenance Due  Topic Date Due   HEMOGLOBIN A1C  01/22/2022      Past Medical History:  Diagnosis Date   ALLERGIC RHINITIS 01/29/2007   Arthritis    ASTHMA 01/29/2007   ASTHMA, WITH ACUTE EXACERBATION 06/24/2008   BACK PAIN 01/29/2009   CHEST PAIN 06/24/2008   Family history of adverse reaction to anesthesia    Patients daughter has N/V after anesthesia   GERD (gastroesophageal reflux disease)    History of shingles    HYPERLIPIDEMIA 01/29/2007   HYPERTENSION 01/29/2007    OSTEOPENIA 10/16/2007   Type II or unspecified type diabetes mellitus without mention of complication, uncontrolled 11/29/2013   Past Surgical History:  Procedure Laterality Date   CHOLECYSTECTOMY N/A 09/19/2015   Procedure: LAPAROSCOPIC CHOLECYSTECTOMY WITH INTRAOPERATIVE CHOLANGIOGRAM;  Surgeon: Jackolyn Confer, MD;  Location: [facility];  Service: General;  Laterality: N/A;   COLONOSCOPY     TONSILLECTOMY  1956   VIDEO BRONCHOSCOPY Bilateral 03/28/2014   Procedure: VIDEO BRONCHOSCOPY WITHOUT FLUORO;  Surgeon: Tanda Rockers, MD;  Location: [facility];  Service: Cardiopulmonary;  Laterality: Bilateral;    reports that she quit smoking about 18 years ago. Her smoking use included cigarettes. She has never used smokeless tobacco. She reports that she does not drink alcohol and does not use drugs. family history includes Asthma in her daughter and father; Cancer in her mother; Diabetes in her father; Heart disease in her father; Hypertension in her father. Allergies  Allergen Reactions   Shingrix [Zoster Vac Recomb Adjuvanted]     Allergy to original live zoster - rash   Current Outpatient Medications on File Prior to Visit  Medication Sig Dispense Refill   aspirin 81 MG tablet Take 81 mg by mouth every morning.     Blood Glucose Monitoring Suppl (ONE TOUCH ULTRA 2) w/Device KIT Use as  directed 1 each 0   Lancets MISC Use as directed four times daily  E11.9 400 each 11   Multiple Vitamin (MULTIVITAMIN WITH MINERALS) TABS tablet Take 1 tablet by mouth daily.     ONETOUCH VERIO test strip USE AS DIRECTED THREE TIMES DAILY 100 strip 4   ONETOUCH VERIO test strip USE TO CHECK BLOOD SUGARS THREE TIMES DAILY 300 strip 3   tiZANidine (ZANAFLEX) 2 MG tablet TAKE 1 TABLET(2 MG) BY MOUTH EVERY 6 HOURS AS NEEDED FOR MUSCLE SPASMS 30 tablet 1   No current facility-administered medications on file prior to visit.        ROS:  All others reviewed and negative.  Objective        PE:  BP (!) 152/86 (BP  Location: Right Arm, Patient Position: Sitting, Cuff Size: Large)   Pulse 90   Temp (!) 97.5 F (36.4 C) (Oral)   Ht '5\' 3"'  (1.6 m)   Wt 160 lb (72.6 kg)   SpO2 91%   BMI 28.34 kg/m                 Constitutional: Pt appears in NAD               HENT: Head: NCAT.                Right Ear: External ear normal.                 Left Ear: External ear normal.                Eyes: . Pupils are equal, round, and reactive to light. Conjunctivae and EOM are normal               Nose: without d/c or deformity               Neck: Neck supple. Gross normal ROM               Cardiovascular: Normal rate and regular rhythm.                 Pulmonary/Chest: Effort normal and breath sounds without rales or wheezing.                Abd:  Soft, NT, ND, + BS, no organomegaly               Neurological: Pt is alert. At baseline orientation, motor grossly intact               Skin: Skin is warm. No rashes, no other new lesions, LE edema - none               Psychiatric: Pt behavior is normal without agitation   Micro: none  Cardiac tracings I have personally interpreted today:  none  Pertinent Radiological findings (summarize): none   Lab Results  Component Value Date   WBC 6.3 07/22/2021   HGB 12.7 07/22/2021   HCT 40.2 07/22/2021   PLT 243.0 07/22/2021   GLUCOSE 95 07/22/2021   CHOL 229 (H) 07/22/2021   TRIG 153.0 (H) 07/22/2021   HDL 59.10 07/22/2021   LDLDIRECT 153.4 01/29/2009   LDLCALC 139 (H) 07/22/2021   ALT 23 07/22/2021   AST 22 07/22/2021   NA 143 07/22/2021   K 2.9 (L) 07/22/2021   CL 103 07/22/2021   CREATININE 0.99 07/22/2021   BUN 19 07/22/2021   CO2 29 07/22/2021   TSH 4.88 07/22/2021   HGBA1C 13.5 (  H) 07/22/2021   MICROALBUR 2.7 (H) 07/22/2021   Assessment/Plan:  Deborah Neal is a 71 y.o. Black or African American [2] female with  has a past medical history of ALLERGIC RHINITIS (01/29/2007), Arthritis, ASTHMA (01/29/2007), ASTHMA, WITH ACUTE EXACERBATION (06/24/2008),  BACK PAIN (01/29/2009), CHEST PAIN (06/24/2008), Family history of adverse reaction to anesthesia, GERD (gastroesophageal reflux disease), History of shingles, HYPERLIPIDEMIA (01/29/2007), HYPERTENSION (01/29/2007), OSTEOPENIA (10/16/2007), and Type II or unspecified type diabetes mellitus without mention of complication, uncontrolled (11/29/2013).  Vitamin D deficiency Last vitamin D Lab Results  Component Value Date   VD25OH 31.51 07/22/2021   Low, reminded to start oral replacement   Encounter for well adult exam with abnormal findings Age and sex appropriate education and counseling updated with regular exercise and diet Referrals for preventative services - refer for colonoscopy Immunizations addressed - for flu shot, declines shingrix Smoking counseling  - none needed Evidence for depression or other mood disorder - none significant Most recent labs reviewed. I have personally reviewed and have noted: 1) the patient's medical and social history 2) The patient's current medications and supplements 3) The patient's height, weight, and BMI have been recorded in the chart   HLD (hyperlipidemia) Lab Results  Component Value Date   LDLCALC 139 (H) 07/22/2021   Uncontrolled, goal ldl < 70, pt to continue current statin crestor 20 mg and f/u lab today    Essential hypertension BP Readings from Last 3 Encounters:  01/22/22 (!) 152/86  07/22/21 134/80  04/24/21 104/75   Uncontrolled, pt to increase losartan 100 mg qd   Diabetes mellitus type II, non insulin dependent ([place]) Lab Results  Component Value Date   HGBA1C 13.5 (H) 07/22/2021   Severe uncontrolled last visit, now back on meds, pt to continue current medical treatment jardiance 25, toujeo and humulin R, for f/u lab, f/u endo  Followup: Return in about 6 months (around 07/23/2022).  [name], MD 01/22/2022 1:18 PM [facility]

## 2022-01-22 NOTE — Assessment & Plan Note (Signed)
BP Readings from Last 3 Encounters:  01/22/22 (!) 152/86  07/22/21 134/80  04/24/21 104/75   Uncontrolled, pt to increase losartan 100 mg qd

## 2022-01-22 NOTE — Assessment & Plan Note (Signed)
Lab Results  Component Value Date   HGBA1C 13.5 (H) 07/22/2021   Severe uncontrolled last visit, now back on meds, pt to continue current medical treatment jardiance 25, toujeo and humulin R, for f/u lab, f/u endo

## 2022-01-22 NOTE — Assessment & Plan Note (Signed)
Age and sex appropriate education and counseling updated with regular exercise and diet Referrals for preventative services - refer for colonoscopy Immunizations addressed - for flu shot, declines shingrix Smoking counseling  - none needed Evidence for depression or other mood disorder - none significant Most recent labs reviewed. I have personally reviewed and have noted: 1) the patient's medical and social history 2) The patient's current medications and supplements 3) The patient's height, weight, and BMI have been recorded in the chart

## 2022-01-22 NOTE — Addendum Note (Signed)
Addended by: Max Sane on: 01/22/2022 01:54 PM   Modules accepted: Orders

## 2022-01-22 NOTE — Assessment & Plan Note (Signed)
Lab Results  Component Value Date   LDLCALC 139 (H) 07/22/2021   Uncontrolled, goal ldl < 70, pt to continue current statin crestor 20 mg and f/u lab today

## 2022-01-22 NOTE — Patient Instructions (Addendum)
You had the flu shot today  Please take OTC Vitamin D3 at 2000 units per day, indefinitely  Ok to increase the Losartan to 100 mg per day  Please ask at the pharmacy to see the Dexcom G7 is covered by your insurance  Please continue all other medications as before, and refills have been done if requested.  Please have the pharmacy call with any other refills you may need.  Please continue your efforts at being more active, low cholesterol diet, and weight control.  You are otherwise up to date with prevention measures today.  Please keep your appointments with your specialists as you may have planned  You will be contacted regarding the referral for: colonoscopy  Please go to the LAB at the blood drawing area for the tests to be done  You will be contacted by phone if any changes need to be made immediately.  Otherwise, you will receive a letter about your results with an explanation, but please check with MyChart first.  Please remember to sign up for MyChart if you have not done so, as this will be important to you in the future with finding out test results, communicating by private email, and scheduling acute appointments online when needed.  Please make an Appointment to return in 6 months, or sooner if needed

## 2022-01-22 NOTE — Assessment & Plan Note (Signed)
Last vitamin D Lab Results  Component Value Date   VD25OH 31.51 07/22/2021   Low, reminded to start oral replacement

## 2022-04-08 ENCOUNTER — Telehealth: Payer: Self-pay | Admitting: Internal Medicine

## 2022-04-08 NOTE — Telephone Encounter (Signed)
Left message for patient to call back to schedule Medicare Annual Wellness Visit   No hx of AWV eligible as of 10/24/17  Please schedule at anytime with [facility] Advisor if patient calls the office back.     Any questions, please call me at [phone]

## 2022-04-09 ENCOUNTER — Inpatient Hospital Stay (HOSPITAL_COMMUNITY): Payer: Medicare PPO

## 2022-04-09 ENCOUNTER — Emergency Department (HOSPITAL_COMMUNITY): Payer: Medicare PPO

## 2022-04-09 ENCOUNTER — Inpatient Hospital Stay (HOSPITAL_COMMUNITY)
Admission: EM | Admit: 2022-04-09 | Discharge: 2022-04-25 | DRG: 640 | Disposition: A | Discharge status: Rehabilitation Facility | Payer: Medicare PPO | Attending: Family Medicine | Admitting: Family Medicine

## 2022-04-09 DIAGNOSIS — Z452 Encounter for adjustment and management of vascular access device: Secondary | ICD-10-CM | POA: Diagnosis not present

## 2022-04-09 DIAGNOSIS — N271 Small kidney, bilateral: Secondary | ICD-10-CM | POA: Diagnosis present

## 2022-04-09 DIAGNOSIS — J9601 Acute respiratory failure with hypoxia: Secondary | ICD-10-CM | POA: Diagnosis present

## 2022-04-09 DIAGNOSIS — E1165 Type 2 diabetes mellitus with hyperglycemia: Secondary | ICD-10-CM | POA: Diagnosis not present

## 2022-04-09 DIAGNOSIS — R5381 Other malaise: Secondary | ICD-10-CM | POA: Diagnosis not present

## 2022-04-09 DIAGNOSIS — G9340 Encephalopathy, unspecified: Secondary | ICD-10-CM | POA: Diagnosis not present

## 2022-04-09 DIAGNOSIS — Z79899 Other long term (current) drug therapy: Secondary | ICD-10-CM

## 2022-04-09 DIAGNOSIS — E872 Acidosis, unspecified: Secondary | ICD-10-CM | POA: Diagnosis not present

## 2022-04-09 DIAGNOSIS — D638 Anemia in other chronic diseases classified elsewhere: Secondary | ICD-10-CM | POA: Diagnosis present

## 2022-04-09 DIAGNOSIS — J1569 Pneumonia due to other gram-negative bacteria: Secondary | ICD-10-CM | POA: Diagnosis not present

## 2022-04-09 DIAGNOSIS — I3139 Other pericardial effusion (noninflammatory): Secondary | ICD-10-CM | POA: Diagnosis present

## 2022-04-09 DIAGNOSIS — I2699 Other pulmonary embolism without acute cor pulmonale: Secondary | ICD-10-CM | POA: Diagnosis present

## 2022-04-09 DIAGNOSIS — I469 Cardiac arrest, cause unspecified: Secondary | ICD-10-CM

## 2022-04-09 DIAGNOSIS — J9811 Atelectasis: Secondary | ICD-10-CM | POA: Diagnosis not present

## 2022-04-09 DIAGNOSIS — G40909 Epilepsy, unspecified, not intractable, without status epilepticus: Secondary | ICD-10-CM | POA: Diagnosis not present

## 2022-04-09 DIAGNOSIS — E785 Hyperlipidemia, unspecified: Secondary | ICD-10-CM | POA: Diagnosis present

## 2022-04-09 DIAGNOSIS — Z825 Family history of asthma and other chronic lower respiratory diseases: Secondary | ICD-10-CM

## 2022-04-09 DIAGNOSIS — E1110 Type 2 diabetes mellitus with ketoacidosis without coma: Secondary | ICD-10-CM

## 2022-04-09 DIAGNOSIS — Z833 Family history of diabetes mellitus: Secondary | ICD-10-CM

## 2022-04-09 DIAGNOSIS — J90 Pleural effusion, not elsewhere classified: Secondary | ICD-10-CM | POA: Diagnosis not present

## 2022-04-09 DIAGNOSIS — Z95810 Presence of automatic (implantable) cardiac defibrillator: Secondary | ICD-10-CM | POA: Diagnosis not present

## 2022-04-09 DIAGNOSIS — N170 Acute kidney failure with tubular necrosis: Secondary | ICD-10-CM | POA: Diagnosis present

## 2022-04-09 DIAGNOSIS — R34 Anuria and oliguria: Secondary | ICD-10-CM | POA: Diagnosis present

## 2022-04-09 DIAGNOSIS — I21A1 Myocardial infarction type 2: Secondary | ICD-10-CM | POA: Diagnosis not present

## 2022-04-09 DIAGNOSIS — G9341 Metabolic encephalopathy: Secondary | ICD-10-CM | POA: Diagnosis present

## 2022-04-09 DIAGNOSIS — E669 Obesity, unspecified: Secondary | ICD-10-CM | POA: Diagnosis present

## 2022-04-09 DIAGNOSIS — J45909 Unspecified asthma, uncomplicated: Secondary | ICD-10-CM | POA: Diagnosis not present

## 2022-04-09 DIAGNOSIS — I472 Ventricular tachycardia, unspecified: Secondary | ICD-10-CM | POA: Diagnosis not present

## 2022-04-09 DIAGNOSIS — R579 Shock, unspecified: Secondary | ICD-10-CM | POA: Diagnosis not present

## 2022-04-09 DIAGNOSIS — J690 Pneumonitis due to inhalation of food and vomit: Secondary | ICD-10-CM | POA: Diagnosis not present

## 2022-04-09 DIAGNOSIS — R0602 Shortness of breath: Secondary | ICD-10-CM | POA: Diagnosis not present

## 2022-04-09 DIAGNOSIS — E8809 Other disorders of plasma-protein metabolism, not elsewhere classified: Secondary | ICD-10-CM | POA: Diagnosis present

## 2022-04-09 DIAGNOSIS — R739 Hyperglycemia, unspecified: Secondary | ICD-10-CM

## 2022-04-09 DIAGNOSIS — D649 Anemia, unspecified: Secondary | ICD-10-CM | POA: Diagnosis not present

## 2022-04-09 DIAGNOSIS — R569 Unspecified convulsions: Secondary | ICD-10-CM

## 2022-04-09 DIAGNOSIS — E038 Other specified hypothyroidism: Secondary | ICD-10-CM | POA: Diagnosis present

## 2022-04-09 DIAGNOSIS — Z20822 Contact with and (suspected) exposure to covid-19: Secondary | ICD-10-CM | POA: Diagnosis present

## 2022-04-09 DIAGNOSIS — I499 Cardiac arrhythmia, unspecified: Secondary | ICD-10-CM | POA: Diagnosis not present

## 2022-04-09 DIAGNOSIS — I4901 Ventricular fibrillation: Secondary | ICD-10-CM | POA: Diagnosis not present

## 2022-04-09 DIAGNOSIS — R7989 Other specified abnormal findings of blood chemistry: Secondary | ICD-10-CM | POA: Diagnosis not present

## 2022-04-09 DIAGNOSIS — I959 Hypotension, unspecified: Secondary | ICD-10-CM | POA: Diagnosis not present

## 2022-04-09 DIAGNOSIS — I462 Cardiac arrest due to underlying cardiac condition: Secondary | ICD-10-CM | POA: Diagnosis not present

## 2022-04-09 DIAGNOSIS — E870 Hyperosmolality and hypernatremia: Secondary | ICD-10-CM | POA: Diagnosis not present

## 2022-04-09 DIAGNOSIS — I1310 Hypertensive heart and chronic kidney disease without heart failure, with stage 1 through stage 4 chronic kidney disease, or unspecified chronic kidney disease: Secondary | ICD-10-CM | POA: Diagnosis present

## 2022-04-09 DIAGNOSIS — Z87891 Personal history of nicotine dependence: Secondary | ICD-10-CM

## 2022-04-09 DIAGNOSIS — Z794 Long term (current) use of insulin: Secondary | ICD-10-CM

## 2022-04-09 DIAGNOSIS — I119 Hypertensive heart disease without heart failure: Secondary | ICD-10-CM | POA: Diagnosis not present

## 2022-04-09 DIAGNOSIS — N179 Acute kidney failure, unspecified: Secondary | ICD-10-CM | POA: Diagnosis not present

## 2022-04-09 DIAGNOSIS — Z9049 Acquired absence of other specified parts of digestive tract: Secondary | ICD-10-CM

## 2022-04-09 DIAGNOSIS — G931 Anoxic brain damage, not elsewhere classified: Secondary | ICD-10-CM | POA: Diagnosis present

## 2022-04-09 DIAGNOSIS — M96A3 Multiple fractures of ribs associated with chest compression and cardiopulmonary resuscitation: Secondary | ICD-10-CM | POA: Diagnosis not present

## 2022-04-09 DIAGNOSIS — Z6830 Body mass index (BMI) 30.0-30.9, adult: Secondary | ICD-10-CM

## 2022-04-09 DIAGNOSIS — G319 Degenerative disease of nervous system, unspecified: Secondary | ICD-10-CM | POA: Diagnosis not present

## 2022-04-09 DIAGNOSIS — Z8619 Personal history of other infectious and parasitic diseases: Secondary | ICD-10-CM

## 2022-04-09 DIAGNOSIS — N189 Chronic kidney disease, unspecified: Secondary | ICD-10-CM | POA: Diagnosis present

## 2022-04-09 DIAGNOSIS — Z7984 Long term (current) use of oral hypoglycemic drugs: Secondary | ICD-10-CM

## 2022-04-09 DIAGNOSIS — K219 Gastro-esophageal reflux disease without esophagitis: Secondary | ICD-10-CM | POA: Diagnosis present

## 2022-04-09 DIAGNOSIS — J9690 Respiratory failure, unspecified, unspecified whether with hypoxia or hypercapnia: Secondary | ICD-10-CM | POA: Diagnosis not present

## 2022-04-09 DIAGNOSIS — R600 Localized edema: Secondary | ICD-10-CM | POA: Diagnosis present

## 2022-04-09 DIAGNOSIS — N3289 Other specified disorders of bladder: Secondary | ICD-10-CM | POA: Diagnosis not present

## 2022-04-09 DIAGNOSIS — Z7982 Long term (current) use of aspirin: Secondary | ICD-10-CM

## 2022-04-09 DIAGNOSIS — R070 Pain in throat: Secondary | ICD-10-CM | POA: Diagnosis not present

## 2022-04-09 DIAGNOSIS — J811 Chronic pulmonary edema: Secondary | ICD-10-CM | POA: Diagnosis not present

## 2022-04-09 DIAGNOSIS — R079 Chest pain, unspecified: Secondary | ICD-10-CM | POA: Diagnosis not present

## 2022-04-09 DIAGNOSIS — M1990 Unspecified osteoarthritis, unspecified site: Secondary | ICD-10-CM | POA: Diagnosis present

## 2022-04-09 DIAGNOSIS — R319 Hematuria, unspecified: Secondary | ICD-10-CM | POA: Diagnosis not present

## 2022-04-09 DIAGNOSIS — Z4682 Encounter for fitting and adjustment of non-vascular catheter: Secondary | ICD-10-CM | POA: Diagnosis not present

## 2022-04-09 DIAGNOSIS — J9600 Acute respiratory failure, unspecified whether with hypoxia or hypercapnia: Secondary | ICD-10-CM | POA: Diagnosis not present

## 2022-04-09 DIAGNOSIS — E876 Hypokalemia: Principal | ICD-10-CM | POA: Diagnosis present

## 2022-04-09 DIAGNOSIS — R0600 Dyspnea, unspecified: Secondary | ICD-10-CM | POA: Diagnosis not present

## 2022-04-09 DIAGNOSIS — R9082 White matter disease, unspecified: Secondary | ICD-10-CM | POA: Diagnosis present

## 2022-04-09 DIAGNOSIS — M8580 Other specified disorders of bone density and structure, unspecified site: Secondary | ICD-10-CM | POA: Diagnosis present

## 2022-04-09 DIAGNOSIS — R55 Syncope and collapse: Secondary | ICD-10-CM | POA: Diagnosis not present

## 2022-04-09 DIAGNOSIS — R000 Tachycardia, unspecified: Secondary | ICD-10-CM | POA: Diagnosis not present

## 2022-04-09 DIAGNOSIS — Z8249 Family history of ischemic heart disease and other diseases of the circulatory system: Secondary | ICD-10-CM

## 2022-04-09 DIAGNOSIS — E119 Type 2 diabetes mellitus without complications: Secondary | ICD-10-CM

## 2022-04-09 DIAGNOSIS — E1122 Type 2 diabetes mellitus with diabetic chronic kidney disease: Secondary | ICD-10-CM | POA: Diagnosis present

## 2022-04-09 DIAGNOSIS — R4182 Altered mental status, unspecified: Secondary | ICD-10-CM | POA: Diagnosis not present

## 2022-04-09 DIAGNOSIS — J189 Pneumonia, unspecified organism: Secondary | ICD-10-CM | POA: Diagnosis not present

## 2022-04-09 DIAGNOSIS — R918 Other nonspecific abnormal finding of lung field: Secondary | ICD-10-CM | POA: Diagnosis not present

## 2022-04-09 DIAGNOSIS — Z887 Allergy status to serum and vaccine status: Secondary | ICD-10-CM

## 2022-04-09 LAB — CK: Total CK: 190 U/L (ref 38–234)

## 2022-04-09 LAB — CBC WITH DIFFERENTIAL/PLATELET
Abs Immature Granulocytes: 0.46 10*3/uL — ABNORMAL HIGH (ref 0.00–0.07)
Basophils Absolute: 0.1 10*3/uL (ref 0.0–0.1)
Basophils Relative: 0 %
Eosinophils Absolute: 0 10*3/uL (ref 0.0–0.5)
Eosinophils Relative: 0 %
HCT: 44.5 % (ref 36.0–46.0)
Hemoglobin: 12.1 g/dL (ref 12.0–15.0)
Immature Granulocytes: 2 %
Lymphocytes Relative: 9 %
Lymphs Abs: 1.8 10*3/uL (ref 0.7–4.0)
MCH: 23.6 pg — ABNORMAL LOW (ref 26.0–34.0)
MCHC: 27.2 g/dL — ABNORMAL LOW (ref 30.0–36.0)
MCV: 86.7 fL (ref 80.0–100.0)
Monocytes Absolute: 1 10*3/uL (ref 0.1–1.0)
Monocytes Relative: 5 %
Neutro Abs: 17.9 10*3/uL — ABNORMAL HIGH (ref 1.7–7.7)
Neutrophils Relative %: 84 %
Platelets: 232 10*3/uL (ref 150–400)
RBC: 5.13 MIL/uL — ABNORMAL HIGH (ref 3.87–5.11)
RDW: 16.1 % — ABNORMAL HIGH (ref 11.5–15.5)
WBC: 21.3 10*3/uL — ABNORMAL HIGH (ref 4.0–10.5)
nRBC: 0 % (ref 0.0–0.2)

## 2022-04-09 LAB — ECHOCARDIOGRAM COMPLETE
Height: 63 in
S' Lateral: 2.2 cm
Weight: 2645.52 oz

## 2022-04-09 LAB — BASIC METABOLIC PANEL
Anion gap: 25 — ABNORMAL HIGH (ref 5–15)
Anion gap: 19 — ABNORMAL HIGH (ref 5–15)
BUN: 23 mg/dL (ref 8–23)
BUN: 21 mg/dL (ref 8–23)
CO2: 17 mmol/L — ABNORMAL LOW (ref 22–32)
CO2: 22 mmol/L (ref 22–32)
Calcium: 9.6 mg/dL (ref 8.9–10.3)
Calcium: 9.6 mg/dL (ref 8.9–10.3)
Chloride: 99 mmol/L (ref 98–111)
Chloride: 106 mmol/L (ref 98–111)
Creatinine, Ser: 2.15 mg/dL — ABNORMAL HIGH (ref 0.44–1.00)
Creatinine, Ser: 1.99 mg/dL — ABNORMAL HIGH (ref 0.44–1.00)
GFR, Estimated: 24 mL/min — ABNORMAL LOW (ref >60)
GFR, Estimated: 27 mL/min — ABNORMAL LOW (ref >60)
Glucose, Bld: 1157 mg/dL (ref 70–99)
Glucose, Bld: 821 mg/dL (ref 70–99)
Potassium: 2.7 mmol/L — CL (ref 3.5–5.1)
Potassium: 2 mmol/L — CL (ref 3.5–5.1)
Sodium: 141 mmol/L (ref 135–145)
Sodium: 147 mmol/L — ABNORMAL HIGH (ref 135–145)

## 2022-04-09 LAB — I-STAT ARTERIAL BLOOD GAS, ED
Acid-base deficit: 13 mmol/L — ABNORMAL HIGH (ref 0.0–2.0)
Acid-base deficit: 4 mmol/L — ABNORMAL HIGH (ref 0.0–2.0)
Bicarbonate: 14.4 mmol/L — ABNORMAL LOW (ref 20.0–28.0)
Bicarbonate: 18.3 mmol/L — ABNORMAL LOW (ref 20.0–28.0)
Calcium, Ion: 1.2 mmol/L (ref 1.15–1.40)
Calcium, Ion: 1.27 mmol/L (ref 1.15–1.40)
HCT: 41 % (ref 36.0–46.0)
HCT: 43 % (ref 36.0–46.0)
Hemoglobin: 13.9 g/dL (ref 12.0–15.0)
Hemoglobin: 14.6 g/dL (ref 12.0–15.0)
O2 Saturation: 100 %
O2 Saturation: 88 %
Potassium: 3.4 mmol/L — ABNORMAL LOW (ref 3.5–5.1)
Potassium: 2.5 mmol/L — CL (ref 3.5–5.1)
Sodium: 135 mmol/L (ref 135–145)
Sodium: 142 mmol/L (ref 135–145)
TCO2: 16 mmol/L — ABNORMAL LOW (ref 22–32)
TCO2: 19 mmol/L — ABNORMAL LOW (ref 22–32)
pCO2 arterial: 37 mmHg (ref 32–48)
pCO2 arterial: 26 mmHg — ABNORMAL LOW (ref 32–48)
pH, Arterial: 7.198 — CL (ref 7.35–7.45)
pH, Arterial: 7.455 — ABNORMAL HIGH (ref 7.35–7.45)
pO2, Arterial: 473 mmHg — ABNORMAL HIGH (ref 83–108)
pO2, Arterial: 50 mmHg — ABNORMAL LOW (ref 83–108)

## 2022-04-09 LAB — GLUCOSE, CAPILLARY
Glucose-Capillary: >600 mg/dL (ref 70–99)
Glucose-Capillary: >600 mg/dL (ref 70–99)
Glucose-Capillary: >600 mg/dL (ref 70–99)
Glucose-Capillary: 591 mg/dL (ref 70–99)
Glucose-Capillary: 507 mg/dL (ref 70–99)
Glucose-Capillary: 548 mg/dL (ref 70–99)

## 2022-04-09 LAB — APTT
aPTT: 24 seconds (ref 24–36)
aPTT: 42 seconds — ABNORMAL HIGH (ref 24–36)

## 2022-04-09 LAB — HIV ANTIBODY (ROUTINE TESTING W REFLEX): HIV Screen 4th Generation wRfx: NONREACTIVE

## 2022-04-09 LAB — CBG MONITORING, ED
Glucose-Capillary: >600 mg/dL (ref 70–99)
Glucose-Capillary: >600 mg/dL (ref 70–99)
Glucose-Capillary: >600 mg/dL (ref 70–99)

## 2022-04-09 LAB — MAGNESIUM: Magnesium: 2.5 mg/dL — ABNORMAL HIGH (ref 1.7–2.4)

## 2022-04-09 LAB — PROTIME-INR
INR: 1.2 (ref 0.8–1.2)
Prothrombin Time: 15 seconds (ref 11.4–15.2)

## 2022-04-09 LAB — BRAIN NATRIURETIC PEPTIDE: B Natriuretic Peptide: 153.2 pg/mL — ABNORMAL HIGH (ref 0.0–100.0)

## 2022-04-09 LAB — MRSA NEXT GEN BY PCR, NASAL: MRSA by PCR Next Gen: NOT DETECTED

## 2022-04-09 LAB — LACTIC ACID, PLASMA
Lactic Acid, Venous: >9 mmol/L (ref 0.5–1.9)
Lactic Acid, Venous: 7.4 mmol/L (ref 0.5–1.9)

## 2022-04-09 LAB — BETA-HYDROXYBUTYRIC ACID: Beta-Hydroxybutyric Acid: 5.26 mmol/L — ABNORMAL HIGH (ref 0.05–0.27)

## 2022-04-09 LAB — TROPONIN I (HIGH SENSITIVITY): Troponin I (High Sensitivity): 97 ng/L — ABNORMAL HIGH (ref <18)

## 2022-04-09 MED ORDER — HEPARIN SODIUM (PORCINE) 5000 UNIT/ML IJ SOLN
5000.0000 [IU] | Freq: Three times a day (TID) | INTRAMUSCULAR | Status: DC
  Administered 2022-04-09: 5000 [IU] via SUBCUTANEOUS
  Filled 2022-04-09: qty 1

## 2022-04-09 MED ORDER — PROPOFOL 1000 MG/100ML IV EMUL
5.0000 ug/kg/min | INTRAVENOUS | Status: DC
  Administered 2022-04-09: 5 ug/kg/min via INTRAVENOUS

## 2022-04-09 MED ORDER — DEXTROSE IN LACTATED RINGERS 5 % IV SOLN: INTRAVENOUS | Status: DC

## 2022-04-09 MED ORDER — LACTATED RINGERS IV BOLUS
1000.0000 mL | INTRAVENOUS | Status: AC
  Administered 2022-04-09 (×2): 1000 mL via INTRAVENOUS

## 2022-04-09 MED ORDER — LACTATED RINGERS IV BOLUS
1000.0000 mL | Freq: Once | INTRAVENOUS | Status: AC
  Administered 2022-04-09: 1000 mL via INTRAVENOUS

## 2022-04-09 MED ORDER — SODIUM CHLORIDE 0.9 % IV SOLN
250.0000 mL | INTRAVENOUS | Status: DC
  Administered 2022-04-13 – 2022-04-16 (×2): 250 mL via INTRAVENOUS

## 2022-04-09 MED ORDER — NOREPINEPHRINE 4 MG/250ML-% IV SOLN
2.0000 ug/min | INTRAVENOUS | Status: DC
  Administered 2022-04-09: 10 ug/min via INTRAVENOUS
  Filled 2022-04-09: qty 250

## 2022-04-09 MED ORDER — FENTANYL CITRATE PF 50 MCG/ML IJ SOSY
25.0000 ug | PREFILLED_SYRINGE | INTRAMUSCULAR | Status: DC | PRN
  Administered 2022-04-09: 50 ug via INTRAVENOUS
  Administered 2022-04-11 – 2022-04-12 (×2): 100 ug via INTRAVENOUS
  Administered 2022-04-13 (×2): 50 ug via INTRAVENOUS
  Administered 2022-04-13: 100 ug via INTRAVENOUS
  Administered 2022-04-14: 50 ug via INTRAVENOUS
  Administered 2022-04-14 (×2): 100 ug via INTRAVENOUS
  Administered 2022-04-15: 50 ug via INTRAVENOUS
  Filled 2022-04-09 (×3): qty 1
  Filled 2022-04-09: qty 2
  Filled 2022-04-09: qty 1
  Filled 2022-04-09: qty 2
  Filled 2022-04-09: qty 1
  Filled 2022-04-09 (×3): qty 2

## 2022-04-09 MED ORDER — POTASSIUM CHLORIDE 20 MEQ PO PACK
40.0000 meq | PACK | Freq: Once | ORAL | Status: AC
  Administered 2022-04-09: 40 meq
  Filled 2022-04-09: qty 2

## 2022-04-09 MED ORDER — INSULIN REGULAR(HUMAN) IN NACL 100-0.9 UT/100ML-% IV SOLN
INTRAVENOUS | Status: DC
  Administered 2022-04-09 (×2): 9.5 [IU]/h via INTRAVENOUS
  Administered 2022-04-10: 5.5 [IU]/h via INTRAVENOUS
  Administered 2022-04-10: 6.5 [IU]/h via INTRAVENOUS
  Administered 2022-04-10: 7 [IU]/h via INTRAVENOUS
  Filled 2022-04-09 (×5): qty 100

## 2022-04-09 MED ORDER — FENTANYL CITRATE PF 50 MCG/ML IJ SOSY
25.0000 ug | PREFILLED_SYRINGE | INTRAMUSCULAR | Status: DC | PRN
  Filled 2022-04-09: qty 1

## 2022-04-09 MED ORDER — LORAZEPAM 2 MG/ML IJ SOLN
2.0000 mg | Freq: Once | INTRAMUSCULAR | Status: AC
  Administered 2022-04-09: 2 mg via INTRAVENOUS

## 2022-04-09 MED ORDER — LORAZEPAM 2 MG/ML IJ SOLN: 2.0000 mg | Freq: Once | INTRAMUSCULAR | Status: AC

## 2022-04-09 MED ORDER — MAGNESIUM SULFATE 2 GM/50ML IV SOLN: 2.0000 g | Freq: Once | INTRAVENOUS | Status: DC

## 2022-04-09 MED ORDER — ACETAMINOPHEN 325 MG PO TABS
650.0000 mg | ORAL_TABLET | ORAL | Status: DC | PRN
  Administered 2022-04-21 – 2022-04-25 (×9): 650 mg via ORAL
  Filled 2022-04-09 (×9): qty 2

## 2022-04-09 MED ORDER — ACETAMINOPHEN 160 MG/5ML PO SOLN
650.0000 mg | ORAL | Status: DC | PRN
  Administered 2022-04-12 – 2022-04-19 (×4): 650 mg
  Filled 2022-04-09 (×4): qty 20.3

## 2022-04-09 MED ORDER — LEVETIRACETAM IN NACL 1000 MG/100ML IV SOLN
1000.0000 mg | Freq: Two times a day (BID) | INTRAVENOUS | Status: DC
  Administered 2022-04-09 (×2): 1000 mg via INTRAVENOUS
  Filled 2022-04-09: qty 100

## 2022-04-09 MED ORDER — DEXTROSE 50 % IV SOLN: 0.0000 mL | INTRAVENOUS | Status: DC | PRN

## 2022-04-09 MED ORDER — CHLORHEXIDINE GLUCONATE CLOTH 2 % EX PADS
6.0000 | MEDICATED_PAD | Freq: Every day | CUTANEOUS | Status: DC
  Administered 2022-04-09 – 2022-04-19 (×11): 6 via TOPICAL

## 2022-04-09 MED ORDER — POLYETHYLENE GLYCOL 3350 17 G PO PACK
17.0000 g | PACK | Freq: Every day | ORAL | Status: DC
  Administered 2022-04-10: 17 g
  Filled 2022-04-09 (×2): qty 1

## 2022-04-09 MED ORDER — HEPARIN (PORCINE) 25000 UT/250ML-% IV SOLN: 950.0000 [IU]/h | INTRAVENOUS | Status: DC

## 2022-04-09 MED ORDER — SODIUM CHLORIDE 0.9 % IV SOLN
2.0000 g | INTRAVENOUS | Status: DC
  Administered 2022-04-09: 2 g via INTRAVENOUS
  Filled 2022-04-09: qty 20

## 2022-04-09 MED ORDER — ACETAMINOPHEN 650 MG RE SUPP: 650.0000 mg | RECTAL | Status: DC | PRN

## 2022-04-09 MED ORDER — POTASSIUM CHLORIDE 10 MEQ/100ML IV SOLN
10.0000 meq | INTRAVENOUS | Status: AC
  Administered 2022-04-09 – 2022-04-10 (×4): 10 meq via INTRAVENOUS
  Filled 2022-04-09 (×4): qty 100

## 2022-04-09 MED ORDER — NOREPINEPHRINE 4 MG/250ML-% IV SOLN
INTRAVENOUS | Status: AC
  Filled 2022-04-09: qty 250

## 2022-04-09 MED ORDER — ROCURONIUM BROMIDE 50 MG/5ML IV SOLN
INTRAVENOUS | Status: DC | PRN
  Administered 2022-04-09: 80 mg via INTRAVENOUS

## 2022-04-09 MED ORDER — PROPOFOL 1000 MG/100ML IV EMUL
5.0000 ug/kg/min | INTRAVENOUS | Status: DC
  Administered 2022-04-09: 10 ug/kg/min via INTRAVENOUS
  Administered 2022-04-09: 30 ug/kg/min via INTRAVENOUS
  Administered 2022-04-10: 20 ug/kg/min via INTRAVENOUS
  Administered 2022-04-10 (×3): 25 ug/kg/min via INTRAVENOUS
  Administered 2022-04-11: 30 ug/kg/min via INTRAVENOUS
  Administered 2022-04-11: 25 ug/kg/min via INTRAVENOUS
  Filled 2022-04-09 (×5): qty 100

## 2022-04-09 MED ORDER — HEPARIN (PORCINE) 25000 UT/250ML-% IV SOLN
950.0000 [IU]/h | INTRAVENOUS | Status: DC
  Administered 2022-04-09: 950 [IU]/h via INTRAVENOUS
  Filled 2022-04-09: qty 250

## 2022-04-09 MED ORDER — DOCUSATE SODIUM 50 MG/5ML PO LIQD
100.0000 mg | Freq: Two times a day (BID) | ORAL | Status: DC
  Administered 2022-04-10 – 2022-04-13 (×3): 100 mg
  Filled 2022-04-09 (×6): qty 10

## 2022-04-09 MED ORDER — TENECTEPLASE 50 MG IV KIT
40.0000 mg | PACK | Freq: Once | INTRAVENOUS | Status: AC
  Administered 2022-04-09: 40 mg via INTRAVENOUS

## 2022-04-09 MED ORDER — LORAZEPAM 2 MG/ML IJ SOLN
INTRAMUSCULAR | Status: AC
  Administered 2022-04-09: 2 mg via INTRAVENOUS
  Filled 2022-04-09: qty 1

## 2022-04-09 MED ORDER — SODIUM BICARBONATE 8.4 % IV SOLN
100.0000 meq | Freq: Once | INTRAVENOUS | Status: AC
  Administered 2022-04-09: 100 meq via INTRAVENOUS

## 2022-04-09 MED ORDER — LORAZEPAM 2 MG/ML IJ SOLN
INTRAMUSCULAR | Status: AC
  Filled 2022-04-09: qty 1

## 2022-04-09 MED ORDER — FAMOTIDINE 20 MG PO TABS: 20.0000 mg | ORAL_TABLET | Freq: Two times a day (BID) | ORAL | Status: DC

## 2022-04-09 MED ORDER — POTASSIUM CHLORIDE 2 MEQ/ML IV SOLN
INTRAVENOUS | Status: DC
  Filled 2022-04-09 (×4): qty 1000

## 2022-04-09 MED ORDER — PHENYLEPHRINE HCL-NACL 20-0.9 MG/250ML-% IV SOLN: 0.0000 ug/min | INTRAVENOUS | Status: DC

## 2022-04-09 MED ORDER — IPRATROPIUM-ALBUTEROL 0.5-2.5 (3) MG/3ML IN SOLN
3.0000 mL | Freq: Four times a day (QID) | RESPIRATORY_TRACT | Status: DC
  Administered 2022-04-10 – 2022-04-19 (×35): 3 mL via RESPIRATORY_TRACT
  Filled 2022-04-09 (×37): qty 3

## 2022-04-09 MED ORDER — PANTOPRAZOLE SODIUM 40 MG IV SOLR
40.0000 mg | Freq: Two times a day (BID) | INTRAVENOUS | Status: DC
  Administered 2022-04-09: 40 mg via INTRAVENOUS
  Filled 2022-04-09 (×2): qty 10

## 2022-04-09 MED ORDER — ONDANSETRON HCL 4 MG/2ML IJ SOLN: 4.0000 mg | Freq: Four times a day (QID) | INTRAMUSCULAR | Status: DC | PRN

## 2022-04-09 MED ORDER — NOREPINEPHRINE 4 MG/250ML-% IV SOLN
0.0000 ug/min | INTRAVENOUS | Status: DC
  Administered 2022-04-09: 5 ug/min via INTRAVENOUS

## 2022-04-09 MED ORDER — LACTATED RINGERS IV SOLN: INTRAVENOUS | Status: DC

## 2022-04-09 MED ORDER — POTASSIUM CHLORIDE 10 MEQ/100ML IV SOLN
10.0000 meq | INTRAVENOUS | Status: AC
  Administered 2022-04-09 (×4): 10 meq via INTRAVENOUS
  Filled 2022-04-09 (×4): qty 100

## 2022-04-09 NOTE — Progress Notes (Signed)
PCCM INTERVAL PROGRESS NOTE  Alerted by cardiology regarding RV clot in transit identified on echocardiogram.   Heparin infusion  CTA chest limited by renal function  If she decompensates would attempt systemic fibrinolysis.     Georgann Housekeeper, AGACNP-BC [facility]  See Amion for personal pager PCCM on call pager [phone] until 7pm. Please call Elink 7p-7a. [phone]  04/09/2022 6:26 PM

## 2022-04-09 NOTE — Progress Notes (Signed)
EEG complete - results pending 

## 2022-04-09 NOTE — ED Provider Notes (Signed)
[facility] EMERGENCY DEPARTMENT Provider Note   CSN: [id number] Arrival date & time: 04/09/22  1052     History  Chief Complaint  Patient presents with   CPR    Deborah Neal is a 71 y.o. female with past medical history significant for HTN, HLD, DMT2, asthma presenting via EMS.  EMS reports that fire department responded first and witnessed seizures.  On their arrival, patient lost pulses.  EMS did CPR for proximally 5 minutes.  ROSC obtained without defib or epi.  They placed a King airway and ventilated her with BVM via [facility] airway.  Patient had recurrence of seizure-like activity and was given 2.5 of IV Versed.  She appeared to not be tolerating the [facility] Airway, EMS gave her 50 mcg of fentanyl.  Blood sugar on arrival 331.  No known preceding illness.  No prior history of seizures.  No known trauma.      Home Medications Prior to Admission medications   Medication Sig Start Date End Date Taking? Authorizing Provider  albuterol (PROAIR HFA) 108 (90 Base) MCG/ACT inhaler INHALE 2 PUFFS BY MOUTH EVERY 6 HOURS AS NEEDED FOR WHEEZING OR SHORTNESS OF BREATH 01/22/22   Biagio Borg, MD  aspirin 81 MG tablet Take 81 mg by mouth every morning.    [provider]  Blood Glucose Monitoring Suppl (ONE TOUCH ULTRA 2) w/Device KIT Use as directed 07/27/16   Biagio Borg, MD  empagliflozin (JARDIANCE) 25 MG TABS tablet Take 1 tablet (25 mg total) by mouth daily. 01/22/22   Biagio Borg, MD  insulin glargine, 2 Unit Dial, (TOUJEO MAX SOLOSTAR) 300 UNIT/ML Solostar Pen ADMINISTER 100 UNITS UNDER THE SKIN DAILY BEFORE AND SUPPER 01/22/22   Biagio Borg, MD  insulin regular human CONCENTRATED (HUMULIN R U-500 KWIKPEN) 500 UNIT/ML KwikPen ADMINISTER 60 UNITS UNDER THE SKIN 30 MINUTES BEFORE MEALS 01/22/22   Biagio Borg, MD  Lancets MISC Use as directed four times daily  E11.9 03/29/17   Biagio Borg, MD  losartan (COZAAR) 100 MG tablet Take 1 tablet (100 mg total) by mouth  daily. 01/22/22   Biagio Borg, MD  Multiple Vitamin (MULTIVITAMIN WITH MINERALS) TABS tablet Take 1 tablet by mouth daily.    [provider]  [facility] VERIO test strip USE AS DIRECTED THREE TIMES DAILY 09/17/19   Elayne Snare, MD  [facility] VERIO test strip USE TO CHECK BLOOD SUGARS THREE TIMES DAILY 04/28/21   Biagio Borg, MD  potassium chloride (KLOR-CON) 10 MEQ tablet 3 tab by mouth once daily 01/22/22   Biagio Borg, MD  rosuvastatin (CRESTOR) 20 MG tablet 1 tab by mouth once daily 01/22/22   Biagio Borg, MD  tiZANidine (ZANAFLEX) 2 MG tablet TAKE 1 TABLET(2 MG) BY MOUTH EVERY 6 HOURS AS NEEDED FOR MUSCLE SPASMS 01/30/20   Biagio Borg, MD      Allergies    Shingrix [zoster vac recomb adjuvanted]    Review of Systems   Review of Systems  Physical Exam Updated Vital Signs BP (!) 77/51   Pulse (!) 111   Resp (!) 26   Ht _0  (1.6 m)   Wt 75 kg   SpO2 99%   BMI 29.29 kg/m  Physical Exam Constitutional:      Comments: Unresponsive, seizure-like activity  HENT:     Head: Atraumatic.     Nose: No congestion.     Mouth/Throat:     Comments: Scant  vomitus with some blood around mouth, oropharynx appears atraumatic Eyes:     Conjunctiva/sclera: Conjunctivae normal.     Pupils: Pupils are equal, round, and reactive to light.  Cardiovascular:     Rate and Rhythm: Regular rhythm. Tachycardia present.     Pulses: Normal pulses.     Heart sounds: Normal heart sounds.  Pulmonary:     Comments: Requiring ventilation via BVM via King airway Abdominal:     General: There is no distension.     Palpations: Abdomen is soft.  Musculoskeletal:     Right lower leg: No edema.     Left lower leg: No edema.     ED Results / Procedures / Treatments   Labs (all labs ordered are listed, but only abnormal results are displayed) Labs Reviewed  COMPREHENSIVE METABOLIC PANEL  MAGNESIUM  BRAIN NATRIURETIC PEPTIDE  CBC WITH DIFFERENTIAL/PLATELET  PROTIME-INR  URINALYSIS, ROUTINE  W REFLEX MICROSCOPIC  LACTIC ACID, PLASMA  LACTIC ACID, PLASMA  CK  CBG MONITORING, ED  TROPONIN I (HIGH SENSITIVITY)    EKG None  Radiology No results found.  Procedures Procedure Name: Intubation Date/Time: 04/09/2022 11:34 AM  Performed by: Luster Landsberg, MDPre-anesthesia Checklist: Patient identified, Suction available, Timeout performed and Patient being monitored Oxygen Delivery Method: Ambu bag Preoxygenation: Pre-oxygenation with 100% oxygen (king airway in place from EMS) Induction Type: Rapid sequence Laryngoscope Size: Mac, Glidescope and 3 Grade View: Grade I Tube size: 7.5 mm Number of attempts: 1 Airway Equipment and Method: Video-laryngoscopy and Rigid stylet Secured at: 22 cm Tube secured with: ETT holder        Medications Ordered in ED Medications  levETIRAcetam (KEPPRA) IVPB 1000 mg/100 mL premix (0 mg Intravenous Stopped 04/09/22 1124)  LORazepam (ATIVAN) 2 MG/ML injection (has no administration in time range)  rocuronium (ZEMURON) injection (80 mg Intravenous Given 04/09/22 1110)  propofol (DIPRIVAN) 1000 MG/100ML infusion (has no administration in time range)  norepinephrine (LEVOPHED) 7m in 2596m(0.016 mg/mL) premix infusion (5 mcg/min Intravenous New Bag/Given 04/09/22 1129)  norepinephrine (LEVOPHED) 4-5 MG/250ML-% infusion SOLN (has no administration in time range)  LORazepam (ATIVAN) injection 2 mg (2 mg Intravenous Given 04/09/22 1057)  LORazepam (ATIVAN) injection 2 mg (2 mg Intravenous Given 04/09/22 1100)  lactated ringers bolus 1,000 mL (1,000 mLs Intravenous New Bag/Given 04/09/22 1117)    ED Course/ Medical Decision Making/ A&P                           Medical Decision Making Amount and/or Complexity of Data Reviewed Labs: ordered. Decision-making details documented in ED Course. Radiology: ordered and independent interpretation performed. Decision-making details documented in ED Course. ECG/medicine tests: ordered and  independent interpretation performed.  Risk Prescription drug management. Decision regarding hospitalization.   Patient was actively seizing on initial arrival.  2 mg of IV Ativan given without improvement.  An additional 2 mg IV Ativan given.  Patient had cessation of seizure-like activity.  Patient was hypertensive and tachycardic on the monitor.  She continued to require BVM.  Patient was intubated as per procedure note above.  At this time, differential diagnosis remains broad.  I am concerned for acute intracranial hemorrhage causing her seizure and current presentation.  No reported sick symptoms.  CBG 330s, however patient has diabetes, hyperglycemia and cerebral edema also on the differential.  Fluid bolus given.  CT head, labs ordered.  Patient CBC shows leukocytosis at 21.3.  ABG showed patient was acidotic at 7.19,  hemoglobin unremarkable, potassium slight low at 3.4.  Lactic acid significantly elevated at greater than 9.  Patient taken urgently for CT head.  No obvious acute intracranial abnormality on CT head.  Chest x-ray showed no pneumothorax, ET tube appropriately located.  EKG showed sinus tachycardia, prolonged QT.  No ST elevations, no ST depressions, no peaked T waves.  Patient remains critically ill.  Her family was updated bedside.  Handoff given to critical care team.  Patient will require admission to critical care for further management.        Final Clinical Impression(s) / ED Diagnoses Final diagnoses:  None    Rx / DC Orders ED Discharge Orders     None         Luster Landsberg, MD 04/09/22 1606    Carmin Muskrat, MD 04/10/22 1024

## 2022-04-09 NOTE — Procedures (Signed)
Cardiopulmonary Resuscitation Note  Deborah Neal  [id number]  Apr 30, 1950  Date:04/09/22  Time:6:57 PM   Provider Performing:Ladarion Munyon   Procedure: Cardiopulmonary Resuscitation [phone])  Indication(s) Loss of Pulse  Consent N/A  Anesthesia N/A   Time Out N/A   Sterile Technique Hand hygiene, gloves   Procedure Description Called to patient's room for CODE BLUE. Initial rhythm was Vfib/Vtach. Patient received high quality chest compressions for 2 minutes with defibrillation or cardioversion when appropriate. Epinephrine was administered every 3 minutes as directed by time Therapist, nutritional. Additional pharmacologic interventions included amiodarone. Additional procedural interventions include intra-osseus line.  Return of spontaneous circulation was achieved.  Family at bedside.   Complications/Tolerance N/A   EBL N/A   Specimen(s) N/A  Estimated time to ROSC: 63mnutes

## 2022-04-09 NOTE — ED Notes (Signed)
Pt's glucose more than 600. NP made aware.

## 2022-04-09 NOTE — Procedures (Signed)
Intraosseous Needle Insertion Procedure Note    Date:04/09/22  Time:6:56 PM   Provider Performing:Ambur Province Wells Guiles   Procedure: Insertion Intraosseous (40814)  Indication(s) Medication administration  Consent Risks of the procedure as well as the alternatives and risks of each were explained to the patient and/or caregiver.  Consent for the procedure was obtained and is signed in the bedside chart  Anesthesia Topical only with 1% lidocaine   Timeout Verified patient identification, verified procedure, site/side was marked, verified correct patient position, special equipment/implants available, medications/allergies/relevant history reviewed, required imaging and test results available.  Procedure Description Area of needle insertion was cleaned with chlorhexidine. Intraosseous needle was placed into the left tibia. Bone marrow was aspirated and site easily flushed. The needle was secured in place and dressing applied.  Complications/Tolerance None; patient tolerated the procedure well.  EBL Minimal    Georgann Housekeeper, AGACNP-BC [facility]  See Amion for personal pager PCCM on call pager [phone] until 7pm. Please call Elink 7p-7a. [phone]  04/09/2022 6:56 PM

## 2022-04-09 NOTE — Progress Notes (Signed)
An USGPIV (ultrasound guided PIV) has been placed for short-term vasopressor infusion. A correctly placed ivWatch must be used when administering Vasopressors. Should this treatment be needed beyond 72 hours, central line access should be obtained.  It will be the responsibility of the bedside nurse to follow best practice to prevent extravasations.   ?

## 2022-04-09 NOTE — Progress Notes (Signed)
   04/09/22 1856  Clinical Encounter Type  Visited With Family;Health care provider  Visit Type Code;Critical Care;Spiritual support;Initial  Referral From Nurse ([place])  Consult/Referral To Chaplain Albertina Parr Morene Crocker)  Recommendations Code [facility]  Spiritual Encounters  Spiritual Needs Emotional;Grief support;Prayer   Chaplain Responded to Spiritual Care Consult Page from [facility]: Barnett Applebaum. Upon arrival Medical team providing Resuscitation Efforts for Ms. Mliss Fritz. Chaplain provided meaningful presence, hospitality and reflective listening for Ms. Tays's family. Chaplain provided emotional and spiritual comfort and support for multiple family members present. [address] [place], Ivin Poot., [phone]

## 2022-04-09 NOTE — Progress Notes (Addendum)
ANTICOAGULATION CONSULT NOTE - Initial Consult  Pharmacy Consult for heparin  Indication: pulmonary embolus and clot right ventricle  Allergies  Allergen Reactions   Shingrix [Zoster Vac Recomb Adjuvanted]     Allergy to original live zoster - rash    Patient Measurements: Height: _0  (160 cm) Weight: 75 kg (165 lb 5.5 oz) IBW/kg (Calculated) : 52.4 Heparin Dosing Weight: 68kg  Vital Signs: Temp: 100 F (37.8 C) (12/15 1815) BP: 116/85 (12/15 1745) Pulse Rate: 122 (12/15 1815)  Labs: Recent Labs    04/09/22 1100 04/09/22 1158 04/09/22 1330 04/09/22 1450 04/09/22 1621  HGB 12.1 13.9  --  14.6  --   HCT 44.5 41.0  --  43.0  --   PLT 232  --   --   --   --   LABPROT 15.0  --   --   --   --   INR 1.2  --   --   --   --   CREATININE  --   --  2.06*  --  2.15*  CKTOTAL  --   --  190  --   --   TROPONINIHS  --   --  97*  --   --     Estimated Creatinine Clearance: 23.6 mL/min (A) (by C-G formula based on SCr of 2.15 mg/dL (H)).   Medical History: Past Medical History:  Diagnosis Date   ALLERGIC RHINITIS 01/29/2007   Arthritis    ASTHMA 01/29/2007   ASTHMA, WITH ACUTE EXACERBATION 06/24/2008   BACK PAIN 01/29/2009   CHEST PAIN 06/24/2008   Family history of adverse reaction to anesthesia    Patients daughter has N/V after anesthesia   GERD (gastroesophageal reflux disease)    History of shingles    HYPERLIPIDEMIA 01/29/2007   HYPERTENSION 01/29/2007   OSTEOPENIA 10/16/2007   Type II or unspecified type diabetes mellitus without mention of complication, uncontrolled 11/29/2013    Medications:  Medications Prior to Admission  Medication Sig Dispense Refill Last Dose   albuterol (PROAIR HFA) 108 (90 Base) MCG/ACT inhaler INHALE 2 PUFFS BY MOUTH EVERY 6 HOURS AS NEEDED FOR WHEEZING OR SHORTNESS OF BREATH 8.5 g 5    aspirin 81 MG tablet Take 81 mg by mouth every morning.      Blood Glucose Monitoring Suppl (ONE TOUCH ULTRA 2) w/Device KIT Use as directed 1 each 0     empagliflozin (JARDIANCE) 25 MG TABS tablet Take 1 tablet (25 mg total) by mouth daily. 90 tablet 3    insulin glargine, 2 Unit Dial, (TOUJEO MAX SOLOSTAR) 300 UNIT/ML Solostar Pen ADMINISTER 100 UNITS UNDER THE SKIN DAILY BEFORE AND SUPPER 6 mL 5    insulin regular human CONCENTRATED (HUMULIN R U-500 KWIKPEN) 500 UNIT/ML KwikPen ADMINISTER 60 UNITS UNDER THE SKIN 30 MINUTES BEFORE MEALS 6 mL 3    Lancets MISC Use as directed four times daily  E11.9 400 each 11    losartan (COZAAR) 100 MG tablet Take 1 tablet (100 mg total) by mouth daily. 90 tablet 3    Multiple Vitamin (MULTIVITAMIN WITH MINERALS) TABS tablet Take 1 tablet by mouth daily.      ONETOUCH VERIO test strip USE AS DIRECTED THREE TIMES DAILY 100 strip 4    ONETOUCH VERIO test strip USE TO CHECK BLOOD SUGARS THREE TIMES DAILY 300 strip 3    potassium chloride (KLOR-CON) 10 MEQ tablet 3 tab by mouth once daily 270 tablet 3    rosuvastatin (CRESTOR) 20 MG  tablet 1 tab by mouth once daily 90 tablet 3    tiZANidine (ZANAFLEX) 2 MG tablet TAKE 1 TABLET(2 MG) BY MOUTH EVERY 6 HOURS AS NEEDED FOR MUSCLE SPASMS 30 tablet 1    Scheduled:   Chlorhexidine Gluconate Cloth  6 each Topical Daily   docusate  100 mg Per Tube BID   heparin  5,000 Units Subcutaneous Q8H   ipratropium-albuterol  3 mL Nebulization Q6H   LORazepam       pantoprazole (PROTONIX) IV  40 mg Intravenous Q12H   polyethylene glycol  17 g Per Tube Daily   Infusions:   sodium chloride     cefTRIAXone (ROCEPHIN)  IV Stopped (04/09/22 1635)   dextrose 5% lactated ringers     insulin 9.5 Units/hr (04/09/22 1820)   lactated ringers 1,000 mL with potassium chloride 20 mEq infusion 125 mL/hr at 04/09/22 1830   levETIRAcetam Stopped (04/09/22 1124)   norepinephrine     norepinephrine (LEVOPHED) Adult infusion 10 mcg/min (04/09/22 1839)   potassium chloride 10 mEq (04/09/22 1836)   propofol (DIPRIVAN) infusion 30 mcg/kg/min (04/09/22 1817)    Assessment: Pt was admitted for  being unresponsive with CPR administered. Pt went into cardiac arrest tonight. Found to have thrombus in right ventricle on ECHO. TNK was administered. Heparin ordered for anticoagulation post TNK. We will start low dose heparin when PTT<80  Addendum  Baseline PTT came back at 24 but we are not sure it has reflected a true value yet post TNK. We will repeat PTT in 2 hrs then decide on IV heparin.  Goal of Therapy:  Heparin level 0.3-0.5 x24 hr then 0.3-0.7 units/ml Monitor platelets by anticoagulation protocol: Yes   Plan:  PTT in 2 hrs then decide on heparin   Onnie Boer, PharmD, BCIDP, AAHIVP, CPP Infectious Disease Pharmacist 04/09/2022 7:15 PM

## 2022-04-09 NOTE — Procedures (Signed)
Routine EEG Report  Deborah Neal is a 71 y.o. female with a history of seizures who is undergoing an EEG to evaluate for seizures.  Report: This EEG was acquired with electrodes placed according to the International 10-20 electrode system (including Fp1, Fp2, F3, F4, C3, C4, P3, P4, O1, O2, T3, T4, T5, T6, A1, A2, Fz, Cz, Pz). The following electrodes were missing or displaced: none.  The background initially consisted of diffuse suppression with intermittent brief runs of low amplitude beta frequencies. Approximately halfway through the recording this transitioned to a burst suppression pattern with bursts consisting of low amplitude semirhythmic theta with overriding beta frequencies lasting 4-5 seconds alternating with periods of diffuse suppression lasting 10-15 seconds. This activity is reactive to stimulation. No sleep architecture was identified. There was no focal slowing. There were no interictal epileptiform discharges. There were no electrographic seizures identified. Photic stimulation and hyperventilation were not performed.  Impression and clinical correlation: This EEG was obtained while comatose and is abnormal due to:  -  burst suppression pattern in a patient not on sedation, indicative of severe global cerebral dysfunction. The bursts consist of low amplitude semirhythmic theta with overriding beta and do not appear epileptiform.  There were no definitive epileptiform abnormalities in this recording. This study will be followed by LTM monitoring for further evaluation.  Su Monks, MD [facility] [phone]  If 7pm- 7am, please page neurology on call as listed in [place].

## 2022-04-09 NOTE — Progress Notes (Signed)
  Echocardiogram 2D Echocardiogram has been performed.  Deborah Neal 04/09/2022, 5:39 PM

## 2022-04-09 NOTE — H&P (Addendum)
NAME:  Deborah Neal, MRN:  [id number], DOB:  [birth year], LOS: 0 ADMISSION DATE:  04/09/2022, CONSULTATION DATE: 12/15 REFERRING MD: Dr. Vanita Panda EDP, CHIEF COMPLAINT: Cardiac arrest  History of Present Illness:  71 year old female with past medical history as below, which is significant for hypertension, hyperlipidemia, type 2 diabetes mellitus, and asthma.  She was in her usual state of health until approximately 5:30 AM on 12/15 when she was seen by granddaughter.  Family attempted to contact her via phone around 7 AM and she did not answer.  When they later went to check on her around 10 AM she was unresponsive in her bed with red emesis seen around her.  Upon arrival to the fire department the patient was unresponsive but did have pulse and respirations. Then suffered seizure.  Upon EMS arrival patient lost pulses and CPR was initiated.  Refer to CPR less than 5 minutes.  Patient demonstrated seizure-like activity and route to [facility] emergency department.  Improved with benzodiazepine administration.  Upon arrival to emergency department the patient was intubated with normal RSI medications including rocuronium.  She remained unresponsive at the time PCCM was asked to admit.  Pertinent  Medical History   has a past medical history of ALLERGIC RHINITIS (01/29/2007), Arthritis, ASTHMA (01/29/2007), ASTHMA, WITH ACUTE EXACERBATION (06/24/2008), BACK PAIN (01/29/2009), CHEST PAIN (06/24/2008), Family history of adverse reaction to anesthesia, GERD (gastroesophageal reflux disease), History of shingles, HYPERLIPIDEMIA (01/29/2007), HYPERTENSION (01/29/2007), OSTEOPENIA (10/16/2007), and Type II or unspecified type diabetes mellitus without mention of complication, uncontrolled (11/29/2013).   Significant Hospital Events: Including procedures, antibiotic start and stop dates in addition to other pertinent events   12/15 admit to [place] s/p cardiac arrest  Interim History / Subjective:    Objective    Blood pressure 112/75, pulse (!) 108, temperature (!) 94.5 F (34.7 C), resp. rate (!) 30, height _0  (1.6 m), weight 75 kg, SpO2 94 %.    Vent Mode: PRVC FiO2 (%):  [100 %] 100 % Set Rate:  [26 bmp] 26 bmp Vt Set:  [420 mL] 420 mL PEEP:  [8 cmH20] 8 cmH20 Plateau Pressure:  [22 cmH20] 22 cmH20   Intake/Output Summary (Last 24 hours) at 04/09/2022 1256 Last data filed at 04/09/2022 1146 Gross per 24 hour  Intake 4.09 ml  Output --  Net 4.09 ml   Filed Weights   04/09/22 1113  Weight: 75 kg    Examination: General: Elderly appearing female on vent HENT: Dried blood on face. No obvious laceration or injury. Pupils pinpoint.  Lungs: Clear bilateral breath sounds Cardiovascular: RRR, no MRG Abdomen: Soft, ND Extremities: No acute deformity or ROM limitation Neuro: GCS 3T. Brainstem reflexes absent. Examined 1.5 hours after rocuronium administration.   Resolved Hospital Problem list     Assessment & Plan:   Cardiac arrest: PEA/asystole. Brief arrest, but potentially prolonged time down with poor perfusion prior to arrest. Reports of seizure like activity so perhaps seizure is the inciting event. No history of sz that I can tell. EKG not consistent with ischemia. CT head is non-acute.  - Echocardiogram - EEG - Trend trop - Admit to ICU for close hemodynamic and neurologic monitoring - Avoid fever. Will start TTM per protocol if she spikes.   Leukocytosis: no clear infectious focus. CXR looks more like pulmonary edema but considering downtime and emesis seen in the room will cover for aspiration.  - Ceftriaxone - Send UA, culture, strep and legionella antigens.  - RVP - Culture  blood as well - Trend WBC and fever thresholds.   Acute respiratory failure with hypoxia - full vent support - repeat ABG - SBT when appropriate - VAP bundle - propofol if needed for RASS goal -1 to -2.   GIB: questionable hematemesis found on scene.  - BID PPI - Cautiously ok with VTE  ppx heparin - Trend CBC   QTC prolongation - give mag - awaiting ED chemistry results, needs to be redrawn due to lab issue  DKA vs hyperglycemia with lactic acidosis DM - Initiate fluid bolus - Start insulin drip - awaiting UA, BHB - Serial labs per protocol - Give K  Lactic acidosis - 2 amps bicarb, repeat ABG - vent rate adjustment limited by air trapping.     Best Practice (right click and "Reselect all SmartList Selections" daily)   Diet/type: NPO DVT prophylaxis: prophylactic heparin  GI prophylaxis: PPI BID Lines: N/A Foley:  Yes, and it is still needed Code Status:  full code Last date of multidisciplinary goals of care discussion [ Family updated by myself and Dr. Tacy Learn 12/15]  Labs   CBC: Recent Labs  Lab 04/09/22 1100 04/09/22 1158  WBC 21.3*  --   NEUTROABS 17.9*  --   HGB 12.1 13.9  HCT 44.5 41.0  MCV 86.7  --   PLT 232  --     Basic Metabolic Panel: Recent Labs  Lab 04/09/22 1158  NA 135  K 3.4*   GFR: CrCl cannot be calculated (Patient's most recent lab result is older than the maximum 21 days allowed.). Recent Labs  Lab 04/09/22 1100  WBC 21.3*  LATICACIDVEN >9.0*    Liver Function Tests: No results for input(s): "AST", "ALT", "ALKPHOS", "BILITOT", "PROT", "ALBUMIN" in the last 168 hours. No results for input(s): "LIPASE", "AMYLASE" in the last 168 hours. No results for input(s): "AMMONIA" in the last 168 hours.  ABG    Component Value Date/Time   PHART 7.198 (LL) 04/09/2022 1158   PCO2ART 37.0 04/09/2022 1158   PO2ART 473 (H) 04/09/2022 1158   HCO3 14.4 (L) 04/09/2022 1158   TCO2 16 (L) 04/09/2022 1158   ACIDBASEDEF 13.0 (H) 04/09/2022 1158   O2SAT 100 04/09/2022 1158     Coagulation Profile: Recent Labs  Lab 04/09/22 1100  INR 1.2    Cardiac Enzymes: No results for input(s): "CKTOTAL", "CKMB", "CKMBINDEX", "TROPONINI" in the last 168 hours.  HbA1C: Hgb A1c MFr Bld  Date/Time Value Ref Range Status  07/22/2021  04:05 PM 13.5 (H) 4.6 - 6.5 % Final    Comment:    Glycemic Control Guidelines for People with Diabetes:Non Diabetic:  <6%Goal of Therapy: <7%Additional Action Suggested:  >8%   03/10/2021 12:17 PM 10.9 Repeated and verified X2. (H) 4.6 - 6.5 % Final    Comment:    Glycemic Control Guidelines for People with Diabetes:Non Diabetic:  <6%Goal of Therapy: <7%Additional Action Suggested:  >8%     CBG: No results for input(s): "GLUCAP" in the last 168 hours.  Review of Systems:   Patient is encephalopathic and/or intubated. Therefore history has been obtained from chart review.   Past Medical History:  She,  has a past medical history of ALLERGIC RHINITIS (01/29/2007), Arthritis, ASTHMA (01/29/2007), ASTHMA, WITH ACUTE EXACERBATION (06/24/2008), BACK PAIN (01/29/2009), CHEST PAIN (06/24/2008), Family history of adverse reaction to anesthesia, GERD (gastroesophageal reflux disease), History of shingles, HYPERLIPIDEMIA (01/29/2007), HYPERTENSION (01/29/2007), OSTEOPENIA (10/16/2007), and Type II or unspecified type diabetes mellitus without mention of complication, uncontrolled (11/29/2013).  Surgical History:   Past Surgical History:  Procedure Laterality Date   CHOLECYSTECTOMY N/A 09/19/2015   Procedure: LAPAROSCOPIC CHOLECYSTECTOMY WITH INTRAOPERATIVE CHOLANGIOGRAM;  Surgeon: Jackolyn Confer, MD;  Location: [facility];  Service: General;  Laterality: N/A;   COLONOSCOPY     TONSILLECTOMY  1956   VIDEO BRONCHOSCOPY Bilateral 03/28/2014   Procedure: VIDEO BRONCHOSCOPY WITHOUT FLUORO;  Surgeon: Tanda Rockers, MD;  Location: [facility];  Service: Cardiopulmonary;  Laterality: Bilateral;     Social History:   reports that she quit smoking about 18 years ago. Her smoking use included cigarettes. She has never used smokeless tobacco. She reports that she does not drink alcohol and does not use drugs.   Family History:  Her family history includes Asthma in her daughter and father; Cancer in her mother; Diabetes  in her father; Heart disease in her father; Hypertension in her father. There is no history of Colon cancer, Esophageal cancer, Rectal cancer, Stomach cancer, or Breast cancer.   Allergies Allergies  Allergen Reactions   Shingrix [Zoster Vac Recomb Adjuvanted]     Allergy to original live zoster - rash     Home Medications  Prior to Admission medications   Medication Sig Start Date End Date Taking? Authorizing Provider  albuterol (PROAIR HFA) 108 (90 Base) MCG/ACT inhaler INHALE 2 PUFFS BY MOUTH EVERY 6 HOURS AS NEEDED FOR WHEEZING OR SHORTNESS OF BREATH 01/22/22   Biagio Borg, MD  aspirin 81 MG tablet Take 81 mg by mouth every morning.    [provider]  Blood Glucose Monitoring Suppl (ONE TOUCH ULTRA 2) w/Device KIT Use as directed 07/27/16   Biagio Borg, MD  empagliflozin (JARDIANCE) 25 MG TABS tablet Take 1 tablet (25 mg total) by mouth daily. 01/22/22   Biagio Borg, MD  insulin glargine, 2 Unit Dial, (TOUJEO MAX SOLOSTAR) 300 UNIT/ML Solostar Pen ADMINISTER 100 UNITS UNDER THE SKIN DAILY BEFORE AND SUPPER 01/22/22   Biagio Borg, MD  insulin regular human CONCENTRATED (HUMULIN R U-500 KWIKPEN) 500 UNIT/ML KwikPen ADMINISTER 60 UNITS UNDER THE SKIN 30 MINUTES BEFORE MEALS 01/22/22   Biagio Borg, MD  Lancets MISC Use as directed four times daily  E11.9 03/29/17   Biagio Borg, MD  losartan (COZAAR) 100 MG tablet Take 1 tablet (100 mg total) by mouth daily. 01/22/22   Biagio Borg, MD  Multiple Vitamin (MULTIVITAMIN WITH MINERALS) TABS tablet Take 1 tablet by mouth daily.    [provider]  [facility] VERIO test strip USE AS DIRECTED THREE TIMES DAILY 09/17/19   Elayne Snare, MD  [facility] VERIO test strip USE TO CHECK BLOOD SUGARS THREE TIMES DAILY 04/28/21   Biagio Borg, MD  potassium chloride (KLOR-CON) 10 MEQ tablet 3 tab by mouth once daily 01/22/22   Biagio Borg, MD  rosuvastatin (CRESTOR) 20 MG tablet 1 tab by mouth once daily 01/22/22   Biagio Borg, MD   tiZANidine (ZANAFLEX) 2 MG tablet TAKE 1 TABLET(2 MG) BY MOUTH EVERY 6 HOURS AS NEEDED FOR MUSCLE SPASMS 01/30/20   Biagio Borg, MD     Critical care time: 58 minutes     Georgann Housekeeper, AGACNP-BC [place] [facility]  See Amion for personal pager PCCM on call pager [phone] until 7pm. Please call Elink 7p-7a. [phone]  04/09/2022 1:47 PM

## 2022-04-09 NOTE — ED Notes (Signed)
Pt's trop went from 60 to 97. ICU NP made aware.

## 2022-04-09 NOTE — Progress Notes (Signed)
I was called at bedside as patient blood pressure dropped to MAP in 40s, she was started on IV Levophed, dose was increased to 20 mics without much improvement, then she went into cardiac arrest.  ROSC was achieved after 2 minutes, please see separate CPR note.  Considering echocardiogram showed possible clot in transition, and now she had a cardiac arrest and requiring vasopressor support Will proceed with TNK infusion, risk and benefit were discussed with patient's family and they are in agreement to try everything.  TNK was pushed   Critical care time 15 minutes  Jacky Kindle, MD [place] [facility] See Amion for pager If no response to pager, please call [phone] until 7pm After 7pm, Please call E-link [phone]

## 2022-04-09 NOTE — ED Triage Notes (Addendum)
Pt BIB [facility] from home d/t unresponsive. Pt last seen normal was at 5:30 am this morning. Pt was found unresponsive by family. Pt was seizing upon Fire arrival, then lost pulse shortly after. EMS did CPR for about 5 mins. 2.5 versed given by EMS. Pt started seizing again upon arrival to hospital parking lot. 50 mcg fentanyl given by EMS. Cbg 331.

## 2022-04-09 NOTE — Progress Notes (Addendum)
ABG obtained, results below. Changes to vent include RR 30-24 and FIO2 30-60%. PCCM aware.   Latest Reference Range & Units 04/09/22 14:50  Sample type  ARTERIAL  pH, Arterial 7.35 - 7.45  7.455 (H)  pCO2 arterial 32 - 48 mmHg 26.0 (L)  pO2, Arterial 83 - 108 mmHg 50 (L)  TCO2 22 - 32 mmol/L 19 (L)  Acid-base deficit 0.0 - 2.0 mmol/L 4.0 (H)  Bicarbonate 20.0 - 28.0 mmol/L 18.3 (L)  O2 Saturation % 88

## 2022-04-09 NOTE — Progress Notes (Signed)
ANTICOAGULATION CONSULT NOTE  Pharmacy Consult for heparin  Indication: pulmonary embolus and clot right ventricle  Allergies  Allergen Reactions   Shingrix [Zoster Vac Recomb Adjuvanted]     Allergy to original live zoster - rash    Patient Measurements: Height: _0  (160 cm) Weight: 75 kg (165 lb 5.5 oz) IBW/kg (Calculated) : 52.4 Heparin Dosing Weight: 68kg  Vital Signs: Temp: 102.9 F (39.4 C) (12/15 2100) Temp Source: Bladder (12/15 1900) BP: 119/81 (12/15 2100) Pulse Rate: 157 (12/15 1900)  Labs: Recent Labs    04/09/22 1100 04/09/22 1158 04/09/22 1330 04/09/22 1450 04/09/22 1621 04/09/22 1833 04/09/22 1932 04/09/22 2126  HGB 12.1 13.9  --  14.6  --   --   --   --   HCT 44.5 41.0  --  43.0  --   --   --   --   PLT 232  --   --   --   --   --   --   --   APTT  --   --   --   --   --   --  24 42*  LABPROT 15.0  --   --   --   --   --   --   --   INR 1.2  --   --   --   --   --   --   --   CREATININE  --   --  2.06*  --  2.15* 1.99*  --   --   CKTOTAL  --   --  190  --   --   --   --   --   TROPONINIHS  --   --  97*  --   --   --   --   --      Estimated Creatinine Clearance: 25.5 mL/min (A) (by C-G formula based on SCr of 1.99 mg/dL (H)).   Medical History: Past Medical History:  Diagnosis Date   ALLERGIC RHINITIS 01/29/2007   Arthritis    ASTHMA 01/29/2007   ASTHMA, WITH ACUTE EXACERBATION 06/24/2008   BACK PAIN 01/29/2009   CHEST PAIN 06/24/2008   Family history of adverse reaction to anesthesia    Patients daughter has N/V after anesthesia   GERD (gastroesophageal reflux disease)    History of shingles    HYPERLIPIDEMIA 01/29/2007   HYPERTENSION 01/29/2007   OSTEOPENIA 10/16/2007   Type II or unspecified type diabetes mellitus without mention of complication, uncontrolled 11/29/2013    Medications:  Medications Prior to Admission  Medication Sig Dispense Refill Last Dose   albuterol (PROAIR HFA) 108 (90 Base) MCG/ACT inhaler INHALE 2 PUFFS BY MOUTH  EVERY 6 HOURS AS NEEDED FOR WHEEZING OR SHORTNESS OF BREATH 8.5 g 5    aspirin 81 MG tablet Take 81 mg by mouth every morning.      Blood Glucose Monitoring Suppl (ONE TOUCH ULTRA 2) w/Device KIT Use as directed 1 each 0    empagliflozin (JARDIANCE) 25 MG TABS tablet Take 1 tablet (25 mg total) by mouth daily. 90 tablet 3    insulin glargine, 2 Unit Dial, (TOUJEO MAX SOLOSTAR) 300 UNIT/ML Solostar Pen ADMINISTER 100 UNITS UNDER THE SKIN DAILY BEFORE AND SUPPER 6 mL 5    insulin regular human CONCENTRATED (HUMULIN R U-500 KWIKPEN) 500 UNIT/ML KwikPen ADMINISTER 60 UNITS UNDER THE SKIN 30 MINUTES BEFORE MEALS 6 mL 3    Lancets MISC Use as directed four times daily  E11.9 400 each 11    losartan (COZAAR) 100 MG tablet Take 1 tablet (100 mg total) by mouth daily. 90 tablet 3    Multiple Vitamin (MULTIVITAMIN WITH MINERALS) TABS tablet Take 1 tablet by mouth daily.      ONETOUCH VERIO test strip USE AS DIRECTED THREE TIMES DAILY 100 strip 4    ONETOUCH VERIO test strip USE TO CHECK BLOOD SUGARS THREE TIMES DAILY 300 strip 3    potassium chloride (KLOR-CON) 10 MEQ tablet 3 tab by mouth once daily 270 tablet 3    rosuvastatin (CRESTOR) 20 MG tablet 1 tab by mouth once daily 90 tablet 3    tiZANidine (ZANAFLEX) 2 MG tablet TAKE 1 TABLET(2 MG) BY MOUTH EVERY 6 HOURS AS NEEDED FOR MUSCLE SPASMS 30 tablet 1    Scheduled:   Chlorhexidine Gluconate Cloth  6 each Topical Daily   docusate  100 mg Per Tube BID   ipratropium-albuterol  3 mL Nebulization Q6H   LORazepam       pantoprazole (PROTONIX) IV  40 mg Intravenous Q12H   polyethylene glycol  17 g Per Tube Daily   Infusions:   sodium chloride     cefTRIAXone (ROCEPHIN)  IV Stopped (04/09/22 1635)   dextrose 5% lactated ringers     heparin     insulin 9.5 Units/hr (04/09/22 1900)   lactated ringers 1,000 mL with potassium chloride 20 mEq infusion 125 mL/hr at 04/09/22 1900   levETIRAcetam Stopped (04/09/22 1124)   norepinephrine     norepinephrine  (LEVOPHED) Adult infusion 20 mcg/min (04/09/22 1900)   phenylephrine (NEO-SYNEPHRINE) Adult infusion     potassium chloride 10 mEq (04/09/22 2107)   propofol (DIPRIVAN) infusion Stopped (04/09/22 1837)    Assessment: Pt was admitted for being unresponsive with CPR administered. Pt went into cardiac arrest tonight. Found to have thrombus in right ventricle on ECHO. TNK was administered. Heparin ordered for anticoagulation post TNK. We will start low dose heparin when PTT<80  Addendum  Baseline PTT came back at 24 but we are not sure it has reflected a true value yet post TNK. We will repeat PTT in 2 hrs then decide on IV heparin.  Repeat aPTT still < 80 at 42.  Okay to start heparin.   Goal of Therapy:  Heparin level 0.3-0.5 x24 hr then 0.3-0.7 units/ml Monitor platelets by anticoagulation protocol: Yes   Plan:  Start heparin without bolus at 950 units/hr. Check heparin level 8 hrs after gtt starts. Daily heparin level and CBC.  [place] Crane, Roylene Reason, BCCP Clinical Pharmacist  04/09/2022 10:30 PM   [facility] pharmacy phone numbers are listed on [email or web address]

## 2022-04-09 NOTE — Progress Notes (Signed)
eLink Physician-Brief Progress Note Patient Name: Deborah Neal DOB: [birth year] MRN: [id number]   Date of Service  04/09/2022  HPI/Events of Note  71 year old female with asthma, hypertension, hyperlipidemia and diabetes type 2 was brought from the emergency department after she was found unresponsive at home, per EMS patient had witnessed tonic-clonic seizure followed by she lost her pulse, CPR was done and ROSC was achieved after 1 cycle.        eICU Interventions  S/p PEA cardiac arrest likely due to seizure  Possibly secondary to metabolic abn, Diabetic ketoacidosis Acute kidney injury due to ischemic ATN Lactic acidosis ? upper GI bleeding Hypokalemia    Today had progressive hypotension, then acutely  declined and arrested, TNK was given due to finding of RV strain with clot in transit. Levophed increased now to 27mg, RN requesting central line access and Arterial line access   Plans -will d/w ground team  - options limited due to recent TNK  - will accept higher doses levophed for now, peripherally  - avoid Arterial line  - Phenylephrine is an option peripherally if neede as can be safely given at higher doses         Deborah Neal Deborah Neal 04/09/2022, 9:34 PM

## 2022-04-09 NOTE — Progress Notes (Signed)
LTM EEG hooked up and running - no initial skin breakdown - push button tested -Pt is in ED and NOT being monitored by [facility]

## 2022-04-09 NOTE — Progress Notes (Signed)
Inpatient Diabetes Program Recommendations  AACE/ADA: New Consensus Statement on Inpatient Glycemic Control (2015)  Target Ranges:  Prepandial:   less than 140 mg/dL      Peak postprandial:   less than 180 mg/dL (1-2 hours)      Critically ill patients:  140 - 180 mg/dL   Lab Results  Component Value Date   GLUCAP >600 (HH) 04/09/2022   HGBA1C 13.5 (H) 07/22/2021    Review of Glycemic Control  Latest Reference Range & Units 04/09/22 15:19  Glucose-Capillary 70 - 99 mg/dL >600 (HH)   Diabetes history: DM Outpatient Diabetes medications:  Jardiance 25 mg daily Toujeo 100 units daily U500 60 units tid with meals Current orders for Inpatient glycemic control:  IV insulin-DKA order set  Inpatient Diabetes Program Recommendations:    Agree with current orders.    Thanks,  Adah Perl, RN, BC-ADM Inpatient Diabetes Coordinator Pager [phone]  (8a-5p)

## 2022-04-09 NOTE — Progress Notes (Signed)
Responded to page to support pt. And family.  Family at bedside.  Pt. Opened eye and tried to speak.  Chaplain provided emotional support. Chaplain available as needed.  Jaclynn Major, [place], [facility], Pager [phone]

## 2022-04-10 DIAGNOSIS — R4182 Altered mental status, unspecified: Secondary | ICD-10-CM

## 2022-04-10 DIAGNOSIS — J9600 Acute respiratory failure, unspecified whether with hypoxia or hypercapnia: Secondary | ICD-10-CM

## 2022-04-10 DIAGNOSIS — R569 Unspecified convulsions: Secondary | ICD-10-CM

## 2022-04-10 DIAGNOSIS — I469 Cardiac arrest, cause unspecified: Secondary | ICD-10-CM | POA: Diagnosis not present

## 2022-04-10 DIAGNOSIS — R579 Shock, unspecified: Secondary | ICD-10-CM

## 2022-04-10 DIAGNOSIS — J9601 Acute respiratory failure with hypoxia: Secondary | ICD-10-CM | POA: Diagnosis not present

## 2022-04-10 DIAGNOSIS — E872 Acidosis, unspecified: Secondary | ICD-10-CM

## 2022-04-10 LAB — COMPREHENSIVE METABOLIC PANEL
ALT: 39 U/L (ref 0–44)
AST: 51 U/L — ABNORMAL HIGH (ref 15–41)
Albumin: 3.3 g/dL — ABNORMAL LOW (ref 3.5–5.0)
Alkaline Phosphatase: 136 U/L — ABNORMAL HIGH (ref 38–126)
Anion gap: 25 — ABNORMAL HIGH (ref 5–15)
BUN: 22 mg/dL (ref 8–23)
CO2: 15 mmol/L — ABNORMAL LOW (ref 22–32)
Calcium: 9.1 mg/dL (ref 8.9–10.3)
Chloride: 102 mmol/L (ref 98–111)
Creatinine, Ser: 2.06 mg/dL — ABNORMAL HIGH (ref 0.44–1.00)
GFR, Estimated: 25 mL/min — ABNORMAL LOW (ref >60)
Glucose, Bld: 1194 mg/dL (ref 70–99)
Potassium: 3.3 mmol/L — ABNORMAL LOW (ref 3.5–5.1)
Sodium: 142 mmol/L (ref 135–145)
Total Bilirubin: 0.8 mg/dL (ref 0.3–1.2)
Total Protein: 6.1 g/dL — ABNORMAL LOW (ref 6.5–8.1)

## 2022-04-10 LAB — TRIGLYCERIDES: Triglycerides: 88 mg/dL (ref <150)

## 2022-04-10 LAB — BASIC METABOLIC PANEL
Anion gap: 11 (ref 5–15)
Anion gap: 12 (ref 5–15)
BUN: 20 mg/dL (ref 8–23)
BUN: 22 mg/dL (ref 8–23)
CO2: 27 mmol/L (ref 22–32)
CO2: 27 mmol/L (ref 22–32)
Calcium: 9.9 mg/dL (ref 8.9–10.3)
Calcium: 9.8 mg/dL (ref 8.9–10.3)
Chloride: 110 mmol/L (ref 98–111)
Chloride: 111 mmol/L (ref 98–111)
Creatinine, Ser: 1.79 mg/dL — ABNORMAL HIGH (ref 0.44–1.00)
Creatinine, Ser: 1.85 mg/dL — ABNORMAL HIGH (ref 0.44–1.00)
GFR, Estimated: 30 mL/min — ABNORMAL LOW (ref >60)
GFR, Estimated: 29 mL/min — ABNORMAL LOW (ref >60)
Glucose, Bld: 466 mg/dL — ABNORMAL HIGH (ref 70–99)
Glucose, Bld: 414 mg/dL — ABNORMAL HIGH (ref 70–99)
Potassium: 3.5 mmol/L (ref 3.5–5.1)
Potassium: 2.5 mmol/L — CL (ref 3.5–5.1)
Sodium: 148 mmol/L — ABNORMAL HIGH (ref 135–145)
Sodium: 150 mmol/L — ABNORMAL HIGH (ref 135–145)

## 2022-04-10 LAB — URINALYSIS, ROUTINE W REFLEX MICROSCOPIC
Bilirubin Urine: NEGATIVE
Glucose, UA: >=500 mg/dL — AB
Ketones, ur: NEGATIVE mg/dL
Nitrite: NEGATIVE
Protein, ur: 100 mg/dL — AB
RBC / HPF: >50 RBC/hpf — ABNORMAL HIGH (ref 0–5)
Specific Gravity, Urine: 1.029 (ref 1.005–1.030)
pH: 5 (ref 5.0–8.0)

## 2022-04-10 LAB — POCT I-STAT 7, (LYTES, BLD GAS, ICA,H+H)
Acid-Base Excess: 1 mmol/L (ref 0.0–2.0)
Bicarbonate: 24.9 mmol/L (ref 20.0–28.0)
Calcium, Ion: 1.35 mmol/L (ref 1.15–1.40)
HCT: 41 % (ref 36.0–46.0)
Hemoglobin: 13.9 g/dL (ref 12.0–15.0)
O2 Saturation: 94 %
Patient temperature: 37
Potassium: 2.8 mmol/L — ABNORMAL LOW (ref 3.5–5.1)
Sodium: 151 mmol/L — ABNORMAL HIGH (ref 135–145)
TCO2: 26 mmol/L (ref 22–32)
pCO2 arterial: 35.1 mmHg (ref 32–48)
pH, Arterial: 7.459 — ABNORMAL HIGH (ref 7.35–7.45)
pO2, Arterial: 68 mmHg — ABNORMAL LOW (ref 83–108)

## 2022-04-10 LAB — BETA-HYDROXYBUTYRIC ACID
Beta-Hydroxybutyric Acid: 0.34 mmol/L — ABNORMAL HIGH (ref 0.05–0.27)
Beta-Hydroxybutyric Acid: 0.18 mmol/L (ref 0.05–0.27)

## 2022-04-10 LAB — LACTIC ACID, PLASMA: Lactic Acid, Venous: 4.9 mmol/L (ref 0.5–1.9)

## 2022-04-10 LAB — CBC
HCT: 42.6 % (ref 36.0–46.0)
Hemoglobin: 13.4 g/dL (ref 12.0–15.0)
MCH: 23.4 pg — ABNORMAL LOW (ref 26.0–34.0)
MCHC: 31.5 g/dL (ref 30.0–36.0)
MCV: 74.5 fL — ABNORMAL LOW (ref 80.0–100.0)
Platelets: 204 10*3/uL (ref 150–400)
RBC: 5.72 MIL/uL — ABNORMAL HIGH (ref 3.87–5.11)
RDW: 15.4 % (ref 11.5–15.5)
WBC: 18 10*3/uL — ABNORMAL HIGH (ref 4.0–10.5)
nRBC: 0 % (ref 0.0–0.2)

## 2022-04-10 LAB — GLUCOSE, CAPILLARY
Glucose-Capillary: 176 mg/dL — ABNORMAL HIGH (ref 70–99)
Glucose-Capillary: 176 mg/dL — ABNORMAL HIGH (ref 70–99)
Glucose-Capillary: 180 mg/dL — ABNORMAL HIGH (ref 70–99)
Glucose-Capillary: 194 mg/dL — ABNORMAL HIGH (ref 70–99)
Glucose-Capillary: 240 mg/dL — ABNORMAL HIGH (ref 70–99)
Glucose-Capillary: 243 mg/dL — ABNORMAL HIGH (ref 70–99)
Glucose-Capillary: 255 mg/dL — ABNORMAL HIGH (ref 70–99)
Glucose-Capillary: 256 mg/dL — ABNORMAL HIGH (ref 70–99)
Glucose-Capillary: 258 mg/dL — ABNORMAL HIGH (ref 70–99)
Glucose-Capillary: 266 mg/dL — ABNORMAL HIGH (ref 70–99)
Glucose-Capillary: 275 mg/dL — ABNORMAL HIGH (ref 70–99)
Glucose-Capillary: 283 mg/dL — ABNORMAL HIGH (ref 70–99)
Glucose-Capillary: 288 mg/dL — ABNORMAL HIGH (ref 70–99)
Glucose-Capillary: 291 mg/dL — ABNORMAL HIGH (ref 70–99)
Glucose-Capillary: 294 mg/dL — ABNORMAL HIGH (ref 70–99)
Glucose-Capillary: 297 mg/dL — ABNORMAL HIGH (ref 70–99)
Glucose-Capillary: 312 mg/dL — ABNORMAL HIGH (ref 70–99)
Glucose-Capillary: 326 mg/dL — ABNORMAL HIGH (ref 70–99)
Glucose-Capillary: 363 mg/dL — ABNORMAL HIGH (ref 70–99)
Glucose-Capillary: 378 mg/dL — ABNORMAL HIGH (ref 70–99)
Glucose-Capillary: 386 mg/dL — ABNORMAL HIGH (ref 70–99)
Glucose-Capillary: 431 mg/dL — ABNORMAL HIGH (ref 70–99)
Glucose-Capillary: 468 mg/dL — ABNORMAL HIGH (ref 70–99)

## 2022-04-10 LAB — TROPONIN I (HIGH SENSITIVITY)
Troponin I (High Sensitivity): 1355 ng/L (ref <18)
Troponin I (High Sensitivity): 1133 ng/L (ref <18)

## 2022-04-10 LAB — STREP PNEUMONIAE URINARY ANTIGEN: Strep Pneumo Urinary Antigen: NEGATIVE

## 2022-04-10 LAB — HEPARIN LEVEL (UNFRACTIONATED)
Heparin Unfractionated: 1.06 IU/mL — ABNORMAL HIGH (ref 0.30–0.70)
Heparin Unfractionated: 0.97 IU/mL — ABNORMAL HIGH (ref 0.30–0.70)
Heparin Unfractionated: 0.97 IU/mL — ABNORMAL HIGH (ref 0.30–0.70)

## 2022-04-10 LAB — PHOSPHORUS: Phosphorus: <1 mg/dL — CL (ref 2.5–4.6)

## 2022-04-10 LAB — MAGNESIUM: Magnesium: 2.2 mg/dL (ref 1.7–2.4)

## 2022-04-10 MED ORDER — ORAL CARE MOUTH RINSE: 15.0000 mL | OROMUCOSAL | Status: DC | PRN

## 2022-04-10 MED ORDER — ORAL CARE MOUTH RINSE
15.0000 mL | OROMUCOSAL | Status: DC
  Administered 2022-04-10 – 2022-04-17 (×80): 15 mL via OROMUCOSAL

## 2022-04-10 MED ORDER — LEVETIRACETAM IN NACL 500 MG/100ML IV SOLN
500.0000 mg | Freq: Two times a day (BID) | INTRAVENOUS | Status: DC
  Administered 2022-04-10 – 2022-04-11 (×4): 500 mg via INTRAVENOUS
  Filled 2022-04-10 (×4): qty 100

## 2022-04-10 MED ORDER — ARTIFICIAL TEARS OPHTHALMIC OINT
TOPICAL_OINTMENT | OPHTHALMIC | Status: DC | PRN
  Filled 2022-04-10: qty 3.5

## 2022-04-10 MED ORDER — PANTOPRAZOLE SODIUM 40 MG IV SOLR
40.0000 mg | Freq: Every day | INTRAVENOUS | Status: DC
  Administered 2022-04-11 – 2022-04-12 (×2): 40 mg via INTRAVENOUS
  Filled 2022-04-10 (×2): qty 10

## 2022-04-10 MED ORDER — POTASSIUM CHLORIDE 10 MEQ/100ML IV SOLN
10.0000 meq | INTRAVENOUS | Status: AC
  Administered 2022-04-10 (×6): 10 meq via INTRAVENOUS
  Filled 2022-04-10 (×6): qty 100

## 2022-04-10 MED ORDER — POTASSIUM PHOSPHATES 15 MMOLE/5ML IV SOLN
45.0000 mmol | Freq: Once | INTRAVENOUS | Status: AC
  Administered 2022-04-10: 45 mmol via INTRAVENOUS
  Filled 2022-04-10: qty 15

## 2022-04-10 MED ORDER — BLISTEX MEDICATED EX OINT
TOPICAL_OINTMENT | CUTANEOUS | Status: DC | PRN
  Filled 2022-04-10: qty 6.3

## 2022-04-10 MED ORDER — HEPARIN (PORCINE) 25000 UT/250ML-% IV SOLN
800.0000 [IU]/h | INTRAVENOUS | Status: DC
  Administered 2022-04-10: 800 [IU]/h via INTRAVENOUS

## 2022-04-10 MED ORDER — NOREPINEPHRINE 4 MG/250ML-% IV SOLN
0.0000 ug/min | INTRAVENOUS | Status: DC
  Administered 2022-04-10: 5 ug/min via INTRAVENOUS
  Administered 2022-04-10: 8 ug/min via INTRAVENOUS
  Administered 2022-04-11: 2 ug/min via INTRAVENOUS
  Administered 2022-04-11: 6 ug/min via INTRAVENOUS
  Administered 2022-04-13: 2 ug/min via INTRAVENOUS
  Administered 2022-04-13: 3 ug/min via INTRAVENOUS
  Filled 2022-04-10 (×5): qty 250

## 2022-04-10 MED ORDER — SODIUM CHLORIDE 0.9 % IV SOLN
3.0000 g | Freq: Two times a day (BID) | INTRAVENOUS | Status: AC
  Administered 2022-04-10 – 2022-04-16 (×14): 3 g via INTRAVENOUS
  Filled 2022-04-10 (×14): qty 8

## 2022-04-10 MED ORDER — HEPARIN (PORCINE) 25000 UT/250ML-% IV SOLN
950.0000 [IU]/h | INTRAVENOUS | Status: DC
  Administered 2022-04-11: 500 [IU]/h via INTRAVENOUS
  Administered 2022-04-14: 800 [IU]/h via INTRAVENOUS
  Administered 2022-04-15: 950 [IU]/h via INTRAVENOUS
  Filled 2022-04-10 (×4): qty 250

## 2022-04-10 MED ORDER — LACTATED RINGERS IV BOLUS
500.0000 mL | Freq: Once | INTRAVENOUS | Status: AC
  Administered 2022-04-11: 500 mL via INTRAVENOUS

## 2022-04-10 NOTE — Procedures (Addendum)
Patient Name: Deborah Neal  MRN: [id number]  Epilepsy Attending: Lora Havens  Referring Physician/Provider: Derek [name], MD  Duration: 04/09/2022 1704 to 1747, 04/10/2022 0108 to 04/11/2022 0100  Patient history: 71 y.o. female with a history of seizures had a GTC seizure followed by cardiac arrest.  EEG to evaluate for seizures.   Level of alertness: comatose  AEDs during EEG study: Propofol, LEV  Technical aspects: This EEG study was done with scalp electrodes positioned according to the 10-20 International system of electrode placement. Electrical activity was reviewed with band pass filter of 1-'70Hz'$ , sensitivity of 7 uV/mm, display speed of 67m/sec with a '60Hz'$  notched filter applied as appropriate. EEG data were recorded continuously and digitally stored.  Video monitoring was available and reviewed as appropriate.  Description: At the beginning of study, EEG showed predominantly low amplitude 2-'3hz'$  delta slowing admixed with 12-'15hz'$  beta activity. EEG was reactive to stimulation. Gradually EEG showed 2-3 seconds of generalized 12-'15Hz'$  beta activity admixed with 2-'3hz'$  delta slowing alternating with 8-10 seconds of  low amplitude 12-'14hz'$  beta activity. Hyperventilation and photic stimulation were not performed.     Of note, parts of study were difficult to interpret due to significant electrode artifact.  EEG was disconnected between 04/09/2022 1747 to 04/10/2022 0108 for unclear reason.   ABNORMALITY - Continuous slow, generalized  IMPRESSION: This study is suggestive of severe diffuse encephalopathy, nonspecific etiology. No seizures or epileptiform discharges were seen throughout the recording.  Jolina Symonds OBarbra Sarks

## 2022-04-10 NOTE — Progress Notes (Signed)
EEG maint complete.  ?

## 2022-04-10 NOTE — Progress Notes (Signed)
[place] for heparin  Indication: pulmonary embolus and clot right ventricle  Allergies  Allergen Reactions   Shingrix [Zoster Vac Recomb Adjuvanted]     Allergy to original live zoster - rash    Patient Measurements: Height: 5' 3" (160 cm) Weight: 73.7 kg (162 lb 7.7 oz) IBW/kg (Calculated) : 52.4 Heparin Dosing Weight: 68kg  Vital Signs: Temp: 97.5 F (36.4 C) (12/16 0730) Temp Source: Bladder (12/16 0700) BP: 125/69 (12/16 0730) Pulse Rate: 193 (12/16 0245)  Labs: Recent Labs    04/09/22 1100 04/09/22 1158 04/09/22 1330 04/09/22 1450 04/09/22 1621 04/09/22 1833 04/09/22 1932 04/09/22 2126 04/10/22 0244 04/10/22 0642  HGB 12.1 13.9  --  14.6  --   --   --   --   --  13.4  HCT 44.5 41.0  --  43.0  --   --   --   --   --  42.6  PLT 232  --   --   --   --   --   --   --   --  204  APTT  --   --   --   --   --   --  24 42*  --   --   LABPROT 15.0  --   --   --   --   --   --   --   --   --   INR 1.2  --   --   --   --   --   --   --   --   --   HEPARINUNFRC  --   --   --   --   --   --   --   --   --  1.06*  CREATININE  --   --  2.06*  --    < > 1.99*  --   --  1.79* 1.85*  CKTOTAL  --   --  190  --   --   --   --   --   --   --   TROPONINIHS  --   --  97*  --   --   --   --   --   --   --    < > = values in this interval not displayed.     Estimated Creatinine Clearance: 27.2 mL/min (A) (by C-G formula based on SCr of 1.85 mg/dL (H)).   Medical History: Past Medical History:  Diagnosis Date   ALLERGIC RHINITIS 01/29/2007   Arthritis    ASTHMA 01/29/2007   ASTHMA, WITH ACUTE EXACERBATION 06/24/2008   BACK PAIN 01/29/2009   CHEST PAIN 06/24/2008   Family history of adverse reaction to anesthesia    Patients daughter has N/V after anesthesia   GERD (gastroesophageal reflux disease)    History of shingles    HYPERLIPIDEMIA 01/29/2007   HYPERTENSION 01/29/2007   OSTEOPENIA 10/16/2007   Type II or unspecified type diabetes  mellitus without mention of complication, uncontrolled 11/29/2013    Medications:  Medications Prior to Admission  Medication Sig Dispense Refill Last Dose   albuterol (PROAIR HFA) 108 (90 Base) MCG/ACT inhaler INHALE 2 PUFFS BY MOUTH EVERY 6 HOURS AS NEEDED FOR WHEEZING OR SHORTNESS OF BREATH 8.5 g 5    aspirin 81 MG tablet Take 81 mg by mouth every morning.      Blood Glucose Monitoring Suppl (ONE TOUCH ULTRA 2) w/Device KIT Use as  directed 1 each 0    empagliflozin (JARDIANCE) 25 MG TABS tablet Take 1 tablet (25 mg total) by mouth daily. 90 tablet 3    insulin glargine, 2 Unit Dial, (TOUJEO MAX SOLOSTAR) 300 UNIT/ML Solostar Pen ADMINISTER 100 UNITS UNDER THE SKIN DAILY BEFORE AND SUPPER 6 mL 5    insulin regular human CONCENTRATED (HUMULIN R U-500 KWIKPEN) 500 UNIT/ML KwikPen ADMINISTER 60 UNITS UNDER THE SKIN 30 MINUTES BEFORE MEALS 6 mL 3    Lancets MISC Use as directed four times daily  E11.9 400 each 11    losartan (COZAAR) 100 MG tablet Take 1 tablet (100 mg total) by mouth daily. 90 tablet 3    Multiple Vitamin (MULTIVITAMIN WITH MINERALS) TABS tablet Take 1 tablet by mouth daily.      ONETOUCH VERIO test strip USE AS DIRECTED THREE TIMES DAILY 100 strip 4    ONETOUCH VERIO test strip USE TO CHECK BLOOD SUGARS THREE TIMES DAILY 300 strip 3    potassium chloride (KLOR-CON) 10 MEQ tablet 3 tab by mouth once daily 270 tablet 3    rosuvastatin (CRESTOR) 20 MG tablet 1 tab by mouth once daily 90 tablet 3    tiZANidine (ZANAFLEX) 2 MG tablet TAKE 1 TABLET(2 MG) BY MOUTH EVERY 6 HOURS AS NEEDED FOR MUSCLE SPASMS 30 tablet 1    Scheduled:   Chlorhexidine Gluconate Cloth  6 each Topical Daily   docusate  100 mg Per Tube BID   ipratropium-albuterol  3 mL Nebulization Q6H   pantoprazole (PROTONIX) IV  40 mg Intravenous Q12H   polyethylene glycol  17 g Per Tube Daily   Infusions:   sodium chloride     cefTRIAXone (ROCEPHIN)  IV Stopped (04/09/22 1635)   dextrose 5% lactated ringers      heparin 950 Units/hr (04/10/22 0600)   insulin 9 Units/hr (04/10/22 0600)   lactated ringers 1,000 mL with potassium chloride 20 mEq infusion 125 mL/hr at 04/10/22 0600   levETIRAcetam Stopped (04/09/22 2350)   norepinephrine (LEVOPHED) Adult infusion 8 mcg/min (04/10/22 2952)   phenylephrine (NEO-SYNEPHRINE) Adult infusion     propofol (DIPRIVAN) infusion 25 mcg/kg/min (04/10/22 0640)    Assessment: Pt was admitted for being unresponsive with CPR administered. Pt went into cardiac arrest tonight. Found to have thrombus in right ventricle on ECHO. TNK was administered. Heparin gtt ordered for anticoagulation post TNK. We will start low dose heparin when PTT<80  HL supratherapeutic at 1.06 this AM  -per RN no s/sx bleeding from patient -CBC: Hgb 13.4/Plt 204    Goal of Therapy:  Heparin level 0.3-0.5 x24 hr then 0.3-0.7 units/ml Monitor platelets by anticoagulation protocol: Yes   Plan:  Stop infusion for ~30 minutes  -restart heparin infusion at reduced rate of 800 units/hr Check heparin level 8 hrs at 1700 Daily heparin level and CBC.   Wilson Singer, PharmD Clinical Pharmacist 04/10/2022 8:41 AM

## 2022-04-10 NOTE — Progress Notes (Signed)
Patient serum troponin went up to 1355 likely due to demand ischemia and from CPR, she is already on heparin infusion.  Continue to monitor

## 2022-04-10 NOTE — Progress Notes (Signed)
ANTICOAGULATION CONSULT NOTE  Pharmacy Consult for heparin  Indication: pulmonary embolus and clot right ventricle  Allergies  Allergen Reactions   Shingrix [Zoster Vac Recomb Adjuvanted]     Allergy to original live zoster - rash    Patient Measurements: Height: _0  (160 cm) Weight: 73.7 kg (162 lb 7.7 oz) IBW/kg (Calculated) : 52.4 Heparin Dosing Weight: 68kg  Vital Signs: Temp: 96.8 F (36 C) (12/16 2200) Temp Source: Bladder (12/16 2200) BP: 102/56 (12/16 2200) Pulse Rate: 118 (12/16 2000)  Labs: Recent Labs    04/09/22 1100 04/09/22 1158 04/09/22 1330 04/09/22 1450 04/09/22 1621 04/09/22 1833 04/09/22 1932 04/09/22 2126 04/10/22 0244 04/10/22 0642 04/10/22 1000 04/10/22 1035 04/10/22 1200 04/10/22 2014 04/10/22 2158  HGB 12.1   < >  --  14.6  --   --   --   --   --  13.4  --  13.9  --   --   --   HCT 44.5   < >  --  43.0  --   --   --   --   --  42.6  --  41.0  --   --   --   PLT 232  --   --   --   --   --   --   --   --  204  --   --   --   --   --   APTT  --   --   --   --   --   --  24 42*  --   --   --   --   --   --   --   LABPROT 15.0  --   --   --   --   --   --   --   --   --   --   --   --   --   --   INR 1.2  --   --   --   --   --   --   --   --   --   --   --   --   --   --   HEPARINUNFRC  --   --   --   --   --   --   --   --   --  1.06*  --   --   --  0.97* 0.97*  CREATININE  --   --  2.06*  --    < > 1.99*  --   --  1.79* 1.85*  --   --   --   --   --   CKTOTAL  --   --  190  --   --   --   --   --   --   --   --   --   --   --   --   TROPONINIHS  --   --  97*  --   --   --   --   --   --   --  1,355*  --  1,133*  --   --    < > = values in this interval not displayed.     Estimated Creatinine Clearance: 27.2 mL/min (A) (by C-G formula based on SCr of 1.85 mg/dL (H)).   Medical History: Past Medical History:  Diagnosis Date   ALLERGIC RHINITIS 01/29/2007   Arthritis  ASTHMA 01/29/2007   ASTHMA, WITH ACUTE EXACERBATION 06/24/2008    BACK PAIN 01/29/2009   CHEST PAIN 06/24/2008   Family history of adverse reaction to anesthesia    Patients daughter has N/V after anesthesia   GERD (gastroesophageal reflux disease)    History of shingles    HYPERLIPIDEMIA 01/29/2007   HYPERTENSION 01/29/2007   OSTEOPENIA 10/16/2007   Type II or unspecified type diabetes mellitus without mention of complication, uncontrolled 11/29/2013    Medications:  Medications Prior to Admission  Medication Sig Dispense Refill Last Dose   albuterol (PROAIR HFA) 108 (90 Base) MCG/ACT inhaler INHALE 2 PUFFS BY MOUTH EVERY 6 HOURS AS NEEDED FOR WHEEZING OR SHORTNESS OF BREATH 8.5 g 5    aspirin 81 MG tablet Take 81 mg by mouth every morning.      Blood Glucose Monitoring Suppl (ONE TOUCH ULTRA 2) w/Device KIT Use as directed 1 each 0    empagliflozin (JARDIANCE) 25 MG TABS tablet Take 1 tablet (25 mg total) by mouth daily. 90 tablet 3    insulin glargine, 2 Unit Dial, (TOUJEO MAX SOLOSTAR) 300 UNIT/ML Solostar Pen ADMINISTER 100 UNITS UNDER THE SKIN DAILY BEFORE AND SUPPER 6 mL 5    insulin regular human CONCENTRATED (HUMULIN R U-500 KWIKPEN) 500 UNIT/ML KwikPen ADMINISTER 60 UNITS UNDER THE SKIN 30 MINUTES BEFORE MEALS 6 mL 3    Lancets MISC Use as directed four times daily  E11.9 400 each 11    losartan (COZAAR) 100 MG tablet Take 1 tablet (100 mg total) by mouth daily. 90 tablet 3    Multiple Vitamin (MULTIVITAMIN WITH MINERALS) TABS tablet Take 1 tablet by mouth daily.      ONETOUCH VERIO test strip USE AS DIRECTED THREE TIMES DAILY 100 strip 4    ONETOUCH VERIO test strip USE TO CHECK BLOOD SUGARS THREE TIMES DAILY 300 strip 3    potassium chloride (KLOR-CON) 10 MEQ tablet 3 tab by mouth once daily 270 tablet 3    rosuvastatin (CRESTOR) 20 MG tablet 1 tab by mouth once daily 90 tablet 3    tiZANidine (ZANAFLEX) 2 MG tablet TAKE 1 TABLET(2 MG) BY MOUTH EVERY 6 HOURS AS NEEDED FOR MUSCLE SPASMS 30 tablet 1    Scheduled:   Chlorhexidine Gluconate Cloth  6  each Topical Daily   docusate  100 mg Per Tube BID   ipratropium-albuterol  3 mL Nebulization Q6H   mouth rinse  15 mL Mouth Rinse Q2H   [START ON 04/11/2022] pantoprazole (PROTONIX) IV  40 mg Intravenous QHS   polyethylene glycol  17 g Per Tube Daily   Infusions:   sodium chloride     ampicillin-sulbactam (UNASYN) IV Stopped (04/10/22 1237)   dextrose 5% lactated ringers 125 mL/hr at 04/10/22 2002   heparin 800 Units/hr (04/10/22 1852)   insulin 6 Units/hr (04/10/22 2156)   lactated ringers 1,000 mL with potassium chloride 20 mEq infusion Stopped (04/10/22 1225)   levETIRAcetam 400 mL/hr at 04/10/22 1852   norepinephrine (LEVOPHED) Adult infusion 6 mcg/min (04/10/22 2206)   propofol (DIPRIVAN) infusion 25 mcg/kg/min (04/10/22 1957)    Assessment: Pt was admitted for being unresponsive with CPR administered. Pt went into cardiac arrest tonight. Found to have thrombus in right ventricle on ECHO. TNK was administered. Heparin gtt ordered for anticoagulation post TNK. We will start low dose heparin when PTT<80  Heparin level remains elevated at 0.97 - drawn centrally and infusing peripherally. RN reports some oral bleeding. Will increase heparin level goal  now >24h from [facility].    Goal of Therapy:  Heparin level 0.3-0.7 units/ml Monitor platelets by anticoagulation protocol: Yes   Plan:  Hold heparin x30 minutes then resume at 600 units/h Recheck heparin level in 8h  Arrie Senate, PharmD, [place], [facility] Clinical Pharmacist [phone] Please check AMION for all [facility] numbers 04/10/2022

## 2022-04-10 NOTE — Progress Notes (Signed)
eLink Physician-Brief Progress Note Patient Name: Deborah Neal DOB: 07/20/1950 MRN: [id number]   Date of Service  04/10/2022  HPI/Events of Note  RN concerned  having to increase Levo via c-line being tachy 128, BP 101/89 (96), CVP 9 on vent  K 2.8, Na 151 and phos < 1 this morning, correction given Still on heparin drip with minimal bleeding noted from mouth and ETT on suctioning  eICU Interventions  Ordered a 500 cc LR bolus Repeat CBC and BMP tonight. eLink to be informed once resulted Discussed with [facility]     Intervention Category Intermediate Interventions: Hypotension - evaluation and management  [name] 04/10/2022, 11:42 PM

## 2022-04-10 NOTE — Progress Notes (Signed)
NAME:  Deborah Neal, MRN:  [id number], DOB:  [birth year], LOS: 1 ADMISSION DATE:  04/09/2022, CONSULTATION DATE: 12/15 REFERRING MD: Dr. Vanita Panda EDP, CHIEF COMPLAINT: Cardiac arrest  History of Present Illness:  71 year old female with past medical history as below, which is significant for hypertension, hyperlipidemia, type 2 diabetes mellitus, and asthma.  She was in her usual state of health until approximately 5:30 AM on 12/15 when she was seen by granddaughter.  Family attempted to contact her via phone around 7 AM and she did not answer.  When they later went to check on her around 10 AM she was unresponsive in her bed with red emesis seen around her.  Upon arrival to the fire department the patient was unresponsive but did have pulse and respirations. Then suffered seizure.  Upon EMS arrival patient lost pulses and CPR was initiated.  Refer to CPR less than 5 minutes.  Patient demonstrated seizure-like activity and route to [facility] emergency department.  Improved with benzodiazepine administration.  Upon arrival to emergency department the patient was intubated with normal RSI medications including rocuronium.  She remained unresponsive at the time PCCM was asked to admit.  Pertinent  Medical History   has a past medical history of ALLERGIC RHINITIS (01/29/2007), Arthritis, ASTHMA (01/29/2007), ASTHMA, WITH ACUTE EXACERBATION (06/24/2008), BACK PAIN (01/29/2009), CHEST PAIN (06/24/2008), Family history of adverse reaction to anesthesia, GERD (gastroesophageal reflux disease), History of shingles, HYPERLIPIDEMIA (01/29/2007), HYPERTENSION (01/29/2007), OSTEOPENIA (10/16/2007), and Type II or unspecified type diabetes mellitus without mention of complication, uncontrolled (11/29/2013).   Significant Hospital Events: Including procedures, antibiotic start and stop dates in addition to other pertinent events   12/15 admit to [place] s/p cardiac arrest  Interim History / Subjective:  At the change of  shift yesterday patient had V. tach/V-fib cardiac arrest, ROSC was achieved after 2 minutes Remain on vasopressor support Started on EEG  Objective   Blood pressure 122/88, pulse (!) 177, temperature 98.8 F (37.1 C), temperature source Bladder, resp. rate (!) 24, height '5\' 3"'$  (1.6 m), weight 73.7 kg, SpO2 100 %.    Vent Mode: PRVC FiO2 (%):  [50 %-100 %] 50 % Set Rate:  [24 bmp-26 bmp] 24 bmp Vt Set:  [420 mL] 420 mL PEEP:  [8 cmH20] 8 cmH20 Plateau Pressure:  [22 cmH20-24 cmH20] 23 cmH20   Intake/Output Summary (Last 24 hours) at 04/10/2022 0947 Last data filed at 04/10/2022 0900 Gross per 24 hour  Intake 5978.27 ml  Output 2100 ml  Net 3878.27 ml   Filed Weights   04/09/22 1113 04/10/22 0500  Weight: 75 kg 73.7 kg    Examination:   Physical exam: General: Crtitically ill-appearing female, orally intubated HEENT: [place]/AT, eyes anicteric.  ETT and OGT in place Neuro: Eyes closed, does not open, not following commands, pupils pinpoint, nonreactive to light, absent corneal reflex.  Positive cough reflex Chest: Bilateral basal crackles, no wheezes Heart: Tachycardic, regular rhythm, no murmurs or gallops Abdomen: Soft, nondistended, bowel sounds present Skin: No rash  Resolved Hospital Problem list     Assessment & Plan:  S/p out of hospital PEA cardiac arrest S/p in-hospital V-fib/V. tach cardiac arrest in the setting of severe hypokalemia Possible massive PE Patient presented s/p PEA cardiac arrest, ROSC was achieved after 2 minutes of CPR Last night patient became hypotensive, was started on pressors that led to retake/V-fib cardiac arrest, ROSC was achieved after 2 minutes of CPR Currently on normothermia protocol Continue telemetry monitoring Continue aggressive electrolyte replacement  Echocardiogram showed normal EF with no wall motion abnormalities but diastolic dysfunction and slightly reduced RV function There was question of clot in transit, received  TNK Continue heparin infusion Initial head CT was negative for acute findings EEG is ongoing which showed complete flattening She was on propofol which was stopped this morning Initial troponin was negative Repeat tropes are pending  Seizure-like activity At home EMS noted seizure-like activity, she probably bit her tongue She was loaded with Keppra Continue Keppra Continue LTM  Acute respiratory failure with hypoxia Aspiration pneumonia Patient continued to spike fever Respiratory culture is pending- full vent support Blood cultures have been negative so far Not ready for spontaneous breathing trial VAP prevention bundle in place Holding all sedation Continue IV Unasyn Repeat ABG this morning  GIB versus patient bit her tongue Patient hemoglobin remained stable Decrease PPI to daily  Hypokalemia/hypophosphatemia Continue aggressive electrolyte replacement and monitor  Diabetic ketoacidosis Patient presented with blood sugar over 1000 Continue insulin infusion per protocol Trend BMP  Lactic acidosis Upon presentation lactate was >9 Now lactate is trending down last 27.5, repeat lactate is pending  Acute metabolic/anoxic encephalopathy Patient is off sedation, neurological exam is poor Initial head CT is negative She does have severe metabolic derangements, working on continue to correct  [employer] (right click and "Reselect all SmartList Selections" daily)   Diet/type: NPO DVT prophylaxis: Systemic heparin GI prophylaxis: Protonix Lines: N/A Foley:  Yes, and it is still needed Code Status:  full code Last date of multidisciplinary goals of care discussion [10/16: Patient daughter was updated at bedside, would like to continue full scope of care  Labs   CBC: Recent Labs  Lab 04/09/22 1100 04/09/22 1158 04/09/22 1450 04/10/22 0642  WBC 21.3*  --   --  18.0*  NEUTROABS 17.9*  --   --   --   HGB 12.1 13.9 14.6 13.4  HCT 44.5 41.0 43.0 42.6  MCV 86.7   --   --  74.5*  PLT 232  --   --  962    Basic Metabolic Panel: Recent Labs  Lab 04/09/22 1330 04/09/22 1450 04/09/22 1621 04/09/22 1833 04/10/22 0244 04/10/22 0642  NA 142 142 141 147* 148* 150*  K 3.3* 2.5* 2.7* 2.0* 3.5 2.5*  CL 102  --  99 106 110 111  CO2 15*  --  17* '22 27 27  '$ GLUCOSE 1,194*  --  1,157* 821* 466* 414*  BUN 22  --  '23 21 20 22  '$ CREATININE 2.06*  --  2.15* 1.99* 1.79* 1.85*  CALCIUM 9.1  --  9.6 9.6 9.9 9.8  MG 2.5*  --   --   --   --  2.2  PHOS  --   --   --   --   --  <1.0*   GFR: Estimated Creatinine Clearance: 27.2 mL/min (A) (by C-G formula based on SCr of 1.85 mg/dL (H)). Recent Labs  Lab 04/09/22 1100 04/09/22 1833 04/10/22 0642  WBC 21.3*  --  18.0*  LATICACIDVEN >9.0* 7.4*  --     Liver Function Tests: Recent Labs  Lab 04/09/22 1330  AST 51*  ALT 39  ALKPHOS 136*  BILITOT 0.8  PROT 6.1*  ALBUMIN 3.3*   No results for input(s): "LIPASE", "AMYLASE" in the last 168 hours. No results for input(s): "AMMONIA" in the last 168 hours.  ABG    Component Value Date/Time   PHART 7.455 (H) 04/09/2022 1450   PCO2ART 26.0 (L)  04/09/2022 1450   PO2ART 50 (L) 04/09/2022 1450   HCO3 18.3 (L) 04/09/2022 1450   TCO2 19 (L) 04/09/2022 1450   ACIDBASEDEF 4.0 (H) 04/09/2022 1450   O2SAT 88 04/09/2022 1450     Coagulation Profile: Recent Labs  Lab 04/09/22 1100  INR 1.2    Cardiac Enzymes: Recent Labs  Lab 04/09/22 1330  CKTOTAL 190    HbA1C: Hgb A1c MFr Bld  Date/Time Value Ref Range Status  07/22/2021 04:05 PM 13.5 (H) 4.6 - 6.5 % Final    Comment:    Glycemic Control Guidelines for People with Diabetes:Non Diabetic:  <6%Goal of Therapy: <7%Additional Action Suggested:  >8%   03/10/2021 12:17 PM 10.9 Repeated and verified X2. (H) 4.6 - 6.5 % Final    Comment:    Glycemic Control Guidelines for People with Diabetes:Non Diabetic:  <6%Goal of Therapy: <7%Additional Action Suggested:  >8%     CBG: Recent Labs  Lab  04/10/22 0326 04/10/22 0432 04/10/22 0544 04/10/22 0648 04/10/22 0850  GLUCAP 255* 258* 363* 266* 312*    This patient is critically ill with multiple organ system failure which requires frequent high complexity decision making, assessment, support, evaluation, and titration of therapies. This was completed through the application of advanced monitoring technologies and extensive interpretation of multiple databases.  During this encounter critical care time was devoted to patient care services described in this note for 46 minutes.    Jacky Kindle, MD [facility] See Amion for pager If no response to pager, please call [phone] until 7pm After 7pm, Please call E-link [phone]

## 2022-04-10 NOTE — Progress Notes (Signed)
Inpatient Diabetes Program Recommendations  AACE/ADA: New Consensus Statement on Inpatient Glycemic Control (2015)  Target Ranges:  Prepandial:   less than 140 mg/dL      Peak postprandial:   less than 180 mg/dL (1-2 hours)      Critically ill patients:  140 - 180 mg/dL   Lab Results  Component Value Date   GLUCAP 240 (H) 04/10/2022   HGBA1C 13.5 (H) 07/22/2021    Review of Glycemic Control  Latest Reference Range & Units 04/10/22 08:50 04/10/22 10:09 04/10/22 11:03 04/10/22 11:56 04/10/22 13:16 04/10/22 13:59  Glucose-Capillary 70 - 99 mg/dL 312 (H) 326 (H) 294 (H) 243 (H) 297 (H) 240 (H)   Diabetes history: DM Outpatient Diabetes medications:  Jardiance 25 mg daily Toujeo 100 units daily U500 60 units tid with meals Current orders for Inpatient glycemic control:  IV insulin-DKA order set  IV insulin gtt rates between 6.5-11.5 units hour  Inpatient Diabetes Program Recommendations:    Remain on IV insulin for now and watch glucose trends.  Thanks,  Tama Headings RN, MSN, BC-ADM Inpatient Diabetes Coordinator Team Pager [phone] (8a-5p)

## 2022-04-11 ENCOUNTER — Inpatient Hospital Stay (HOSPITAL_COMMUNITY): Payer: Medicare PPO

## 2022-04-11 DIAGNOSIS — J9600 Acute respiratory failure, unspecified whether with hypoxia or hypercapnia: Secondary | ICD-10-CM | POA: Diagnosis not present

## 2022-04-11 DIAGNOSIS — I469 Cardiac arrest, cause unspecified: Secondary | ICD-10-CM | POA: Diagnosis not present

## 2022-04-11 DIAGNOSIS — R4182 Altered mental status, unspecified: Secondary | ICD-10-CM | POA: Diagnosis not present

## 2022-04-11 DIAGNOSIS — R569 Unspecified convulsions: Secondary | ICD-10-CM | POA: Diagnosis not present

## 2022-04-11 DIAGNOSIS — R579 Shock, unspecified: Secondary | ICD-10-CM | POA: Diagnosis not present

## 2022-04-11 LAB — GLUCOSE, CAPILLARY
Glucose-Capillary: 104 mg/dL — ABNORMAL HIGH (ref 70–99)
Glucose-Capillary: 135 mg/dL — ABNORMAL HIGH (ref 70–99)
Glucose-Capillary: 141 mg/dL — ABNORMAL HIGH (ref 70–99)
Glucose-Capillary: 149 mg/dL — ABNORMAL HIGH (ref 70–99)
Glucose-Capillary: 149 mg/dL — ABNORMAL HIGH (ref 70–99)
Glucose-Capillary: 157 mg/dL — ABNORMAL HIGH (ref 70–99)
Glucose-Capillary: 157 mg/dL — ABNORMAL HIGH (ref 70–99)
Glucose-Capillary: 160 mg/dL — ABNORMAL HIGH (ref 70–99)
Glucose-Capillary: 208 mg/dL — ABNORMAL HIGH (ref 70–99)
Glucose-Capillary: 228 mg/dL — ABNORMAL HIGH (ref 70–99)
Glucose-Capillary: 264 mg/dL — ABNORMAL HIGH (ref 70–99)
Glucose-Capillary: 292 mg/dL — ABNORMAL HIGH (ref 70–99)
Glucose-Capillary: 294 mg/dL — ABNORMAL HIGH (ref 70–99)
Glucose-Capillary: 92 mg/dL (ref 70–99)

## 2022-04-11 LAB — HEPARIN LEVEL (UNFRACTIONATED)
Heparin Unfractionated: 0.41 IU/mL (ref 0.30–0.70)
Heparin Unfractionated: 0.37 IU/mL (ref 0.30–0.70)

## 2022-04-11 LAB — CBC
HCT: 35.9 % — ABNORMAL LOW (ref 36.0–46.0)
Hemoglobin: 11.6 g/dL — ABNORMAL LOW (ref 12.0–15.0)
MCH: 23.8 pg — ABNORMAL LOW (ref 26.0–34.0)
MCHC: 32.3 g/dL (ref 30.0–36.0)
MCV: 73.6 fL — ABNORMAL LOW (ref 80.0–100.0)
Platelets: 153 10*3/uL (ref 150–400)
RBC: 4.88 MIL/uL (ref 3.87–5.11)
RDW: 15.8 % — ABNORMAL HIGH (ref 11.5–15.5)
WBC: 19.3 10*3/uL — ABNORMAL HIGH (ref 4.0–10.5)
nRBC: 0 % (ref 0.0–0.2)

## 2022-04-11 LAB — BASIC METABOLIC PANEL
Anion gap: 12 (ref 5–15)
BUN: 24 mg/dL — ABNORMAL HIGH (ref 8–23)
CO2: 26 mmol/L (ref 22–32)
Calcium: 8.6 mg/dL — ABNORMAL LOW (ref 8.9–10.3)
Chloride: 110 mmol/L (ref 98–111)
Creatinine, Ser: 2.9 mg/dL — ABNORMAL HIGH (ref 0.44–1.00)
GFR, Estimated: 17 mL/min — ABNORMAL LOW (ref >60)
Glucose, Bld: 165 mg/dL — ABNORMAL HIGH (ref 70–99)
Potassium: 3.3 mmol/L — ABNORMAL LOW (ref 3.5–5.1)
Sodium: 148 mmol/L — ABNORMAL HIGH (ref 135–145)

## 2022-04-11 LAB — PHOSPHORUS: Phosphorus: 3.5 mg/dL (ref 2.5–4.6)

## 2022-04-11 LAB — MAGNESIUM: Magnesium: 1.8 mg/dL (ref 1.7–2.4)

## 2022-04-11 LAB — LACTIC ACID, PLASMA: Lactic Acid, Venous: 2.8 mmol/L (ref 0.5–1.9)

## 2022-04-11 MED ORDER — INSULIN DETEMIR 100 UNIT/ML ~~LOC~~ SOLN
15.0000 [IU] | Freq: Two times a day (BID) | SUBCUTANEOUS | Status: DC
  Administered 2022-04-11 (×2): 15 [IU] via SUBCUTANEOUS
  Filled 2022-04-11 (×3): qty 0.15

## 2022-04-11 MED ORDER — MAGNESIUM SULFATE IN D5W 1-5 GM/100ML-% IV SOLN
1.0000 g | Freq: Once | INTRAVENOUS | Status: AC
  Administered 2022-04-11: 1 g via INTRAVENOUS
  Filled 2022-04-11: qty 100

## 2022-04-11 MED ORDER — POTASSIUM CHLORIDE 10 MEQ/100ML IV SOLN
10.0000 meq | INTRAVENOUS | Status: AC
  Administered 2022-04-11 (×2): 10 meq via INTRAVENOUS
  Filled 2022-04-11 (×2): qty 100

## 2022-04-11 MED ORDER — FUROSEMIDE 10 MG/ML IJ SOLN
120.0000 mg | Freq: Once | INTRAVENOUS | Status: AC
  Administered 2022-04-11: 120 mg via INTRAVENOUS
  Filled 2022-04-11: qty 10

## 2022-04-11 MED ORDER — DEXMEDETOMIDINE HCL IN NACL 400 MCG/100ML IV SOLN
0.0000 ug/kg/h | INTRAVENOUS | Status: DC
  Administered 2022-04-11: 0.4 ug/kg/h via INTRAVENOUS
  Administered 2022-04-12: 0.3 ug/kg/h via INTRAVENOUS
  Administered 2022-04-13: 0.6 ug/kg/h via INTRAVENOUS
  Administered 2022-04-13: 0.7 ug/kg/h via INTRAVENOUS
  Administered 2022-04-13: 0.8 ug/kg/h via INTRAVENOUS
  Administered 2022-04-14: 0.9 ug/kg/h via INTRAVENOUS
  Administered 2022-04-14 (×3): 1 ug/kg/h via INTRAVENOUS
  Administered 2022-04-15 (×3): 1.2 ug/kg/h via INTRAVENOUS
  Administered 2022-04-15: 0.1 ug/kg/h via INTRAVENOUS
  Administered 2022-04-16 (×3): 1.2 ug/kg/h via INTRAVENOUS
  Administered 2022-04-16 – 2022-04-17 (×4): 1 ug/kg/h via INTRAVENOUS
  Administered 2022-04-18: 0.4 ug/kg/h via INTRAVENOUS
  Administered 2022-04-19: 0.3 ug/kg/h via INTRAVENOUS
  Filled 2022-04-11 (×16): qty 100
  Filled 2022-04-11: qty 200
  Filled 2022-04-11 (×4): qty 100
  Filled 2022-04-11: qty 200

## 2022-04-11 MED ORDER — INSULIN ASPART 100 UNIT/ML IJ SOLN
1.0000 [IU] | INTRAMUSCULAR | Status: DC
  Administered 2022-04-11 (×2): 3 [IU] via SUBCUTANEOUS

## 2022-04-11 NOTE — Progress Notes (Signed)
Verbal order from Dr Tacy Learn to day shift RN to d/c [employer]

## 2022-04-11 NOTE — Progress Notes (Signed)
LTM EEG discontinued - no skin breakdown at unhook.   

## 2022-04-11 NOTE — Progress Notes (Signed)
LTM maint complete - no skin breakdown [facility] monitored, Event button test confirmed by [facility]. ? ?

## 2022-04-11 NOTE — Progress Notes (Signed)
ANTICOAGULATION CONSULT NOTE  Pharmacy Consult for heparin  Indication: pulmonary embolus and clot right ventricle  Allergies  Allergen Reactions   Shingrix [Zoster Vac Recomb Adjuvanted]     Allergy to original live zoster - rash    Patient Measurements: Height: '5\' 3"'$  (160 cm) Weight: 76.1 kg (167 lb 12.3 oz) IBW/kg (Calculated) : 52.4 Heparin Dosing Weight: 68kg  Vital Signs: Temp: 97.9 F (36.6 C) (12/17 1005) Temp Source: Bladder (12/17 1000) BP: 126/63 (12/17 1005) Pulse Rate: 130 (12/17 1005)  Labs: Recent Labs    04/09/22 1100 04/09/22 1158 04/09/22 1330 04/09/22 1450 04/09/22 1932 04/09/22 2126 04/10/22 0244 04/10/22 0642 04/10/22 1000 04/10/22 1035 04/10/22 1200 04/10/22 2014 04/10/22 2158 04/10/22 2359 04/11/22 0331 04/11/22 0632  HGB 12.1   < >  --    < >  --   --   --  13.4  --  13.9  --   --   --  11.6*  --   --   HCT 44.5   < >  --    < >  --   --   --  42.6  --  41.0  --   --   --  35.9*  --   --   PLT 232  --   --   --   --   --   --  204  --   --   --   --   --  153  --   --   APTT  --   --   --   --  24 42*  --   --   --   --   --   --   --   --   --   --   LABPROT 15.0  --   --   --   --   --   --   --   --   --   --   --   --   --   --   --   INR 1.2  --   --   --   --   --   --   --   --   --   --   --   --   --   --   --   HEPARINUNFRC  --   --   --   --   --   --   --  1.06*  --   --   --    < > 0.97*  --  0.41 0.37  CREATININE  --   --  2.06*   < >  --   --  1.79* 1.85*  --   --   --   --   --  2.90*  --   --   CKTOTAL  --   --  190  --   --   --   --   --   --   --   --   --   --   --   --   --   TROPONINIHS  --   --  97*  --   --   --   --   --  1,355*  --  1,133*  --   --   --   --   --    < > = values in this interval not displayed.     Estimated Creatinine Clearance: 17.6 mL/min (A) (  by C-G formula based on SCr of 2.9 mg/dL (H)).  Assessment: Pt was admitted for being unresponsive with CPR administered. Pt went into cardiac  arrest tonight. Found to have thrombus in right ventricle on ECHO. TNK was administered. Heparin gtt ordered for anticoagulation post TNK. We will start low dose heparin when PTT<80  Hep lvl within goal this am    Goal of Therapy:  Heparin level 0.3-0.7 units/ml Monitor platelets by anticoagulation protocol: Yes   Plan:  Continue heparin 500 units/hr Daily hep lvl cbc  Barth Kirks, PharmD, BCCCP Clinical Pharmacist  Please check AMION for all [place] [facility] numbers  04/11/2022 10:57 AM

## 2022-04-11 NOTE — Progress Notes (Signed)
Pt is off for MRI, Per Dr Tacy Learn, pt. may NOT go back on, depends on the results of the MRI

## 2022-04-11 NOTE — Progress Notes (Signed)
Patient transported to MRI & back to room 3M06 on the ventilator with no problems.

## 2022-04-11 NOTE — Progress Notes (Signed)
NAME:  Deborah Neal, MRN:  [id number], DOB:  [birth year], LOS: 2 ADMISSION DATE:  04/09/2022, CONSULTATION DATE: 12/15 REFERRING MD: Dr. Vanita Panda EDP, CHIEF COMPLAINT: Cardiac arrest  History of Present Illness:  71 year old female with past medical history as below, which is significant for hypertension, hyperlipidemia, type 2 diabetes mellitus, and asthma.  She was in her usual state of health until approximately 5:30 AM on 12/15 when she was seen by granddaughter.  Family attempted to contact her via phone around 7 AM and she did not answer.  When they later went to check on her around 10 AM she was unresponsive in her bed with red emesis seen around her.  Upon arrival to the fire department the patient was unresponsive but did have pulse and respirations. Then suffered seizure.  Upon EMS arrival patient lost pulses and CPR was initiated.  Refer to CPR less than 5 minutes.  Patient demonstrated seizure-like activity and route to [facility] emergency department.  Improved with benzodiazepine administration.  Upon arrival to emergency department the patient was intubated with normal RSI medications including rocuronium.  She remained unresponsive at the time PCCM was asked to admit.  Pertinent  Medical History   has a past medical history of ALLERGIC RHINITIS (01/29/2007), Arthritis, ASTHMA (01/29/2007), ASTHMA, WITH ACUTE EXACERBATION (06/24/2008), BACK PAIN (01/29/2009), CHEST PAIN (06/24/2008), Family history of adverse reaction to anesthesia, GERD (gastroesophageal reflux disease), History of shingles, HYPERLIPIDEMIA (01/29/2007), HYPERTENSION (01/29/2007), OSTEOPENIA (10/16/2007), and Type II or unspecified type diabetes mellitus without mention of complication, uncontrolled (11/29/2013).   Significant Hospital Events: Including procedures, antibiotic start and stop dates in addition to other pertinent events   12/15 admit to [facility] s/p cardiac arrest  Interim History / Subjective:  Patient became  agitated overnight, requiring reinitiation of propofol infusion She remains anuric, serum creatinine continue to trend up She started with slight bleeding through mouth and via ET tube  Objective   Blood pressure 126/63, pulse (!) 130, temperature 97.9 F (36.6 C), resp. rate (!) 35, height '5\' 3"'$  (1.6 m), weight 76.1 kg, SpO2 94 %. CVP:  [9 mmHg] 9 mmHg  Vent Mode: PRVC FiO2 (%):  [50 %] 50 % Set Rate:  [24 bmp] 24 bmp Vt Set:  [420 mL] 420 mL PEEP:  [8 cmH20] 8 cmH20 Plateau Pressure:  [21 cmH20-30 cmH20] 30 cmH20   Intake/Output Summary (Last 24 hours) at 04/11/2022 1059 Last data filed at 04/11/2022 1007 Gross per 24 hour  Intake 6548.12 ml  Output 15 ml  Net 6533.12 ml   Filed Weights   04/09/22 1113 04/10/22 0500 04/11/22 0500  Weight: 75 kg 73.7 kg 76.1 kg    Examination:    Physical exam: General: Crtitically ill-appearing female, orally intubated HEENT: [place]/AT, eyes anicteric.  ETT and OGT in place Neuro: Eyes closed, does not open, not following commands, no response to painful stimuli, positive cough and gag Chest: Tachypneic, bilateral basal crackles, no wheezes or rhonchi Heart: Tachycardic, regular rhythm, no murmurs or gallops Abdomen: Soft, nondistended, bowel sounds present Skin: No rash  Resolved Hospital Problem list     Assessment & Plan:  S/p out of hospital PEA cardiac arrest S/p in-hospital V-fib/V. tach cardiac arrest in the setting of severe hypokalemia Possible massive PE Patient completed normothermia protocol Continue telemetry monitoring, remained in sinus tachycardia Continue aggressive electrolyte replacement, last night she received more electrolyte replacement  Echocardiogram showed normal EF with no wall motion abnormalities but diastolic dysfunction and slightly reduced RV function  There was question of clot in transit, on echocardiogram now s/p TNK Continue heparin infusion Initial head CT was negative for acute findings EEG is  ongoing which showed complete flattening We will switch propofol to Precedex Will obtain MRI brain to see if she has anoxic brain injury  Demand cardiac ischemia Due to CPR, acute illness and V-fib arrest Patient troponin elevated with reaching maximum to 1355, now trended down to 1133 Echocardiogram showed no wall motion abnormalities Continue telemetry monitoring  Seizure-like activity At home EMS noted seizure-like activity, she probably bit her tongue Continue Keppra EEG negative for seizures  Acute respiratory failure with hypoxia Aspiration pneumonia Patient became afebrile Cultures have been negative VAP prevention bundle in place PAD protocol with Precedex with RASS goal -1 Continue IV Unasyn  GIB versus patient bit her tongue Patient is having coffee-ground emesis and bleeding through the mouth Continue PPI daily Monitor H&H  Hypokalemia/hypophosphatemia hypomagnesemia/hypernatremia Continue aggressive electrolyte replacement and closely monitor  Diabetic ketoacidosis Patient presented with blood sugar over 1000 Anion gap closed Insulin infusion was transitioned to long-acting and sliding scale  Lactic acidosis Upon presentation lactate was >9 Lactate has trended down, last 1 is 2.8  Acute metabolic/anoxic encephalopathy And was restarted back on sedation due to tachypnea Initial head CT is negative Will get MRI brain today  Acute kidney injury due to ischemic ATN Patient remained anuric, serum creatinine continue to trend up Will get MRI brain to see if she has anoxic brain injury before calling nephrology Avoid nephrotoxic agents  Best Practice (right click and "Reselect all SmartList Selections" daily)   Diet/type: NPO DVT prophylaxis: Systemic heparin GI prophylaxis: Protonix Lines: N/A Foley:  Yes, and it is still needed Code Status:  full code Last date of multidisciplinary goals of care discussion [10/17: Patient daughter was updated at  bedside, would like to continue full scope of care  Labs   CBC: Recent Labs  Lab 04/09/22 1100 04/09/22 1158 04/09/22 1450 04/10/22 0642 04/10/22 1035 04/10/22 2359  WBC 21.3*  --   --  18.0*  --  19.3*  NEUTROABS 17.9*  --   --   --   --   --   HGB 12.1 13.9 14.6 13.4 13.9 11.6*  HCT 44.5 41.0 43.0 42.6 41.0 35.9*  MCV 86.7  --   --  74.5*  --  73.6*  PLT 232  --   --  204  --  833    Basic Metabolic Panel: Recent Labs  Lab 04/09/22 1330 04/09/22 1450 04/09/22 1621 04/09/22 1833 04/10/22 0244 04/10/22 0642 04/10/22 1035 04/10/22 2359  NA 142   < > 141 147* 148* 150* 151* 148*  K 3.3*   < > 2.7* 2.0* 3.5 2.5* 2.8* 3.3*  CL 102  --  99 106 110 111  --  110  CO2 15*  --  17* '22 27 27  '$ --  26  GLUCOSE 1,194*  --  1,157* 821* 466* 414*  --  165*  BUN 22  --  '23 21 20 22  '$ --  24*  CREATININE 2.06*  --  2.15* 1.99* 1.79* 1.85*  --  2.90*  CALCIUM 9.1  --  9.6 9.6 9.9 9.8  --  8.6*  MG 2.5*  --   --   --   --  2.2  --  1.8  PHOS  --   --   --   --   --  <1.0*  --  3.5   < > =  values in this interval not displayed.   GFR: Estimated Creatinine Clearance: 17.6 mL/min (A) (by C-G formula based on SCr of 2.9 mg/dL (H)). Recent Labs  Lab 04/09/22 1100 04/09/22 1833 04/10/22 0642 04/10/22 1246 04/10/22 2359  WBC 21.3*  --  18.0*  --  19.3*  LATICACIDVEN >9.0* 7.4*  --  4.9* 2.8*    Liver Function Tests: Recent Labs  Lab 04/09/22 1330  AST 51*  ALT 39  ALKPHOS 136*  BILITOT 0.8  PROT 6.1*  ALBUMIN 3.3*   No results for input(s): "LIPASE", "AMYLASE" in the last 168 hours. No results for input(s): "AMMONIA" in the last 168 hours.  ABG    Component Value Date/Time   PHART 7.459 (H) 04/10/2022 1035   PCO2ART 35.1 04/10/2022 1035   PO2ART 68 (L) 04/10/2022 1035   HCO3 24.9 04/10/2022 1035   TCO2 26 04/10/2022 1035   ACIDBASEDEF 4.0 (H) 04/09/2022 1450   O2SAT 94 04/10/2022 1035     Coagulation Profile: Recent Labs  Lab 04/09/22 1100  INR 1.2     Cardiac Enzymes: Recent Labs  Lab 04/09/22 1330  CKTOTAL 190    HbA1C: Hgb A1c MFr Bld  Date/Time Value Ref Range Status  07/22/2021 04:05 PM 13.5 (H) 4.6 - 6.5 % Final    Comment:    Glycemic Control Guidelines for People with Diabetes:Non Diabetic:  <6%Goal of Therapy: <7%Additional Action Suggested:  >8%   03/10/2021 12:17 PM 10.9 Repeated and verified X2. (H) 4.6 - 6.5 % Final    Comment:    Glycemic Control Guidelines for People with Diabetes:Non Diabetic:  <6%Goal of Therapy: <7%Additional Action Suggested:  >8%     CBG: Recent Labs  Lab 04/11/22 0602 04/11/22 0725 04/11/22 0806 04/11/22 0901 04/11/22 1006  GLUCAP 141* 149* 294* 292* 135*    This patient is critically ill with multiple organ system failure which requires frequent high complexity decision making, assessment, support, evaluation, and titration of therapies. This was completed through the application of advanced monitoring technologies and extensive interpretation of multiple databases.  During this encounter critical care time was devoted to patient care services described in this note for 39 minutes.    Jacky Kindle, MD [facility] See Amion for pager If no response to pager, please call [phone] until 7pm After 7pm, Please call E-link [phone]

## 2022-04-11 NOTE — Procedures (Signed)
Patient Name: Deborah Neal  MRN: [id number]  Epilepsy Attending: Lora Havens  Referring Physician/Provider: Derek [name], MD  Duration: 04/11/2022 0100 to 04/11/2022 1525   Patient history: 71 y.o. female with a history of seizures had a GTC seizure followed by cardiac arrest.  EEG to evaluate for seizures.    Level of alertness: comatose   AEDs during EEG study: Propofol, LEV   Technical aspects: This EEG study was done with scalp electrodes positioned according to the 10-20 International system of electrode placement. Electrical activity was reviewed with band pass filter of 1-'70Hz'$ , sensitivity of 7 uV/mm, display speed of 23m/sec with a '60Hz'$  notched filter applied as appropriate. EEG data were recorded continuously and digitally stored.  Video monitoring was available and reviewed as appropriate.   Description:  EEG initially showed 2-3 seconds of generalized 12-'15Hz'$  beta activity admixed with 2-'3hz'$  delta slowing alternating with 8-10 seconds of  low amplitude 12-'14hz'$  beta activity. Gradually EEG showed continuous generalized background attenuation. Hyperventilation and photic stimulation were not performed.     Of note, parts of study were difficult to interpret due to significant electrode artifact.   ABNORMALITY - Continuous slow, generalized   IMPRESSION: This study is suggestive of severe to profound diffuse encephalopathy, nonspecific etiology. No seizures or epileptiform discharges were seen throughout the recording.   Kiandria Clum OBarbra Sarks

## 2022-04-11 NOTE — Progress Notes (Signed)
eLink Physician-Brief Progress Note Patient Name: SHELITA STEPTOE DOB: 01/08/1951 MRN: [id number]   Date of Service  04/11/2022  HPI/Events of Note  Received request to transition off insulin drip Not on tube feeding Insulin drip at 3 EGFR 17  eICU Interventions  Transition off insulin drip protocol ordered     Intervention Category Intermediate Interventions: Hyperglycemia - evaluation and treatment  Shona Needles Kateline Kinkade 04/11/2022, 6:55 AM

## 2022-04-11 NOTE — Progress Notes (Signed)
[facility] Progress Note Patient Name: Deborah Neal DOB: 02/18/1951 MRN: [id number]   Date of Service  04/11/2022  HPI/Events of Note  Notified of K 3.3, creatinine 2.9 Mg 1.8 H/H 11.6/35.9  eICU Interventions  Ordered potassium total 20 meqs only given worsening renal function Magnesium 1 g IVPB     Intervention Category Intermediate Interventions: Electrolyte abnormality - evaluation and management  [name] 04/11/2022, 5:08 AM

## 2022-04-12 DIAGNOSIS — R739 Hyperglycemia, unspecified: Secondary | ICD-10-CM | POA: Diagnosis not present

## 2022-04-12 DIAGNOSIS — I469 Cardiac arrest, cause unspecified: Secondary | ICD-10-CM | POA: Diagnosis not present

## 2022-04-12 DIAGNOSIS — J9600 Acute respiratory failure, unspecified whether with hypoxia or hypercapnia: Secondary | ICD-10-CM | POA: Diagnosis not present

## 2022-04-12 DIAGNOSIS — R569 Unspecified convulsions: Secondary | ICD-10-CM | POA: Diagnosis not present

## 2022-04-12 LAB — GLUCOSE, CAPILLARY
Glucose-Capillary: 261 mg/dL — ABNORMAL HIGH (ref 70–99)
Glucose-Capillary: 319 mg/dL — ABNORMAL HIGH (ref 70–99)
Glucose-Capillary: 209 mg/dL — ABNORMAL HIGH (ref 70–99)
Glucose-Capillary: 281 mg/dL — ABNORMAL HIGH (ref 70–99)
Glucose-Capillary: 173 mg/dL — ABNORMAL HIGH (ref 70–99)
Glucose-Capillary: 155 mg/dL — ABNORMAL HIGH (ref 70–99)
Glucose-Capillary: 165 mg/dL — ABNORMAL HIGH (ref 70–99)
Glucose-Capillary: 204 mg/dL — ABNORMAL HIGH (ref 70–99)
Glucose-Capillary: 203 mg/dL — ABNORMAL HIGH (ref 70–99)
Glucose-Capillary: 167 mg/dL — ABNORMAL HIGH (ref 70–99)
Glucose-Capillary: 182 mg/dL — ABNORMAL HIGH (ref 70–99)
Glucose-Capillary: 127 mg/dL — ABNORMAL HIGH (ref 70–99)
Glucose-Capillary: 179 mg/dL — ABNORMAL HIGH (ref 70–99)
Glucose-Capillary: 200 mg/dL — ABNORMAL HIGH (ref 70–99)
Glucose-Capillary: 186 mg/dL — ABNORMAL HIGH (ref 70–99)

## 2022-04-12 LAB — LEGIONELLA PNEUMOPHILA SEROGP 1 UR AG: L. pneumophila Serogp 1 Ur Ag: NEGATIVE

## 2022-04-12 LAB — CBC
HCT: 28.8 % — ABNORMAL LOW (ref 36.0–46.0)
Hemoglobin: 9.3 g/dL — ABNORMAL LOW (ref 12.0–15.0)
MCH: 23.3 pg — ABNORMAL LOW (ref 26.0–34.0)
MCHC: 32.3 g/dL (ref 30.0–36.0)
MCV: 72.2 fL — ABNORMAL LOW (ref 80.0–100.0)
Platelets: 115 10*3/uL — ABNORMAL LOW (ref 150–400)
RBC: 3.99 MIL/uL (ref 3.87–5.11)
RDW: 15.8 % — ABNORMAL HIGH (ref 11.5–15.5)
WBC: 14.9 10*3/uL — ABNORMAL HIGH (ref 4.0–10.5)
nRBC: 0.2 % (ref 0.0–0.2)

## 2022-04-12 LAB — BASIC METABOLIC PANEL
Anion gap: 11 (ref 5–15)
BUN: 37 mg/dL — ABNORMAL HIGH (ref 8–23)
CO2: 24 mmol/L (ref 22–32)
Calcium: 7.7 mg/dL — ABNORMAL LOW (ref 8.9–10.3)
Chloride: 110 mmol/L (ref 98–111)
Creatinine, Ser: 4.41 mg/dL — ABNORMAL HIGH (ref 0.44–1.00)
GFR, Estimated: 10 mL/min — ABNORMAL LOW (ref >60)
Glucose, Bld: 322 mg/dL — ABNORMAL HIGH (ref 70–99)
Potassium: 4.1 mmol/L (ref 3.5–5.1)
Sodium: 145 mmol/L (ref 135–145)

## 2022-04-12 LAB — PHOSPHORUS: Phosphorus: 6.4 mg/dL — ABNORMAL HIGH (ref 2.5–4.6)

## 2022-04-12 LAB — HEPARIN LEVEL (UNFRACTIONATED)
Heparin Unfractionated: 0.15 IU/mL — ABNORMAL LOW (ref 0.30–0.70)
Heparin Unfractionated: 0.19 IU/mL — ABNORMAL LOW (ref 0.30–0.70)
Heparin Unfractionated: 0.37 IU/mL (ref 0.30–0.70)

## 2022-04-12 LAB — MAGNESIUM: Magnesium: 1.9 mg/dL (ref 1.7–2.4)

## 2022-04-12 MED ORDER — VITAL 1.5 CAL PO LIQD
1000.0000 mL | ORAL | Status: DC
  Administered 2022-04-12: 1000 mL

## 2022-04-12 MED ORDER — INSULIN ASPART 100 UNIT/ML IJ SOLN
8.0000 [IU] | Freq: Once | INTRAMUSCULAR | Status: AC
  Administered 2022-04-12: 8 [IU] via SUBCUTANEOUS

## 2022-04-12 MED ORDER — DEXTROSE IN LACTATED RINGERS 5 % IV SOLN: INTRAVENOUS | Status: DC

## 2022-04-12 MED ORDER — VITAL HIGH PROTEIN PO LIQD: 1000.0000 mL | ORAL | Status: DC

## 2022-04-12 MED ORDER — INSULIN ASPART 100 UNIT/ML IJ SOLN
3.0000 [IU] | Freq: Once | INTRAMUSCULAR | Status: AC
  Administered 2022-04-12: 3 [IU] via SUBCUTANEOUS

## 2022-04-12 MED ORDER — HEPARIN BOLUS VIA INFUSION
2000.0000 [IU] | Freq: Once | INTRAVENOUS | Status: AC
  Administered 2022-04-12: 2000 [IU] via INTRAVENOUS
  Filled 2022-04-12: qty 2000

## 2022-04-12 MED ORDER — INSULIN DETEMIR 100 UNIT/ML ~~LOC~~ SOLN
5.0000 [IU] | Freq: Once | SUBCUTANEOUS | Status: AC
  Administered 2022-04-12: 5 [IU] via SUBCUTANEOUS
  Filled 2022-04-12: qty 0.05

## 2022-04-12 MED ORDER — DEXTROSE 50 % IV SOLN: 0.0000 mL | INTRAVENOUS | Status: DC | PRN

## 2022-04-12 MED ORDER — LEVETIRACETAM IN NACL 500 MG/100ML IV SOLN
500.0000 mg | INTRAVENOUS | Status: DC
  Administered 2022-04-12 – 2022-04-17 (×6): 500 mg via INTRAVENOUS
  Filled 2022-04-12 (×6): qty 100

## 2022-04-12 MED ORDER — INSULIN REGULAR(HUMAN) IN NACL 100-0.9 UT/100ML-% IV SOLN
INTRAVENOUS | Status: AC
  Administered 2022-04-12: 8.5 [IU]/h via INTRAVENOUS
  Administered 2022-04-13: 7 [IU]/h via INTRAVENOUS
  Administered 2022-04-14: 3 [IU]/h via INTRAVENOUS
  Administered 2022-04-14: 4.6 [IU]/h via INTRAVENOUS
  Administered 2022-04-15: 6.5 [IU]/h via INTRAVENOUS
  Administered 2022-04-16: 5 [IU]/h via INTRAVENOUS
  Filled 2022-04-12 (×8): qty 100

## 2022-04-12 MED ORDER — INSULIN DETEMIR 100 UNIT/ML ~~LOC~~ SOLN
20.0000 [IU] | Freq: Two times a day (BID) | SUBCUTANEOUS | Status: DC
  Filled 2022-04-12 (×2): qty 0.2

## 2022-04-12 NOTE — Progress Notes (Signed)
[facility] Progress Note Patient Name: Deborah Neal DOB: [birth year] MRN: [id number]   Date of Service  04/12/2022  HPI/Events of Note  Extra dose of Levemir 5 to be given now and hopefully patient will not go back on th insulin drip  eICU Interventions  Ordered an extra dose of insulin aspart 3 units for additional coverage     Intervention Category Intermediate Interventions: Hyperglycemia - evaluation and treatment  Shona Needles Cayde Held 04/12/2022, 1:14 AM

## 2022-04-12 NOTE — Progress Notes (Signed)
Initial Nutrition Assessment  DOCUMENTATION CODES:   Not applicable  INTERVENTION:   Initiate trickle tube feeds via OG tube: - Vital 1.5 @ 20 ml/hr (480 ml/day)   RD will monitor for ability to advance tube feeding regimen to goal: - Vital 1.5 @ 50 ml/hr (1200 ml/day) - PROSource TF20 60 ml daily  Tube feeding regimen at goal rate would provide 1880 kcal, 101 grams of protein, and 917 ml of H2O.   NUTRITION DIAGNOSIS:   Inadequate oral intake related to inability to eat as evidenced by NPO status.  GOAL:   Patient will meet greater than or equal to 90% of their needs  MONITOR:   Vent status, Labs, Weight trends, TF tolerance, Skin, I & O's  REASON FOR ASSESSMENT:   Ventilator, Consult Enteral/tube feeding initiation and management (trickle TF)  ASSESSMENT:   71 year old female who presented to the ED on 12/15 after being found unresponsive by family. Pt seizing upon arrival and experienced out-of-hospital PEA cardiac arrest. Pt intubated in the ED. PMH of asthma, HTN, HLD, T2DM. Pt admitted with DKA, AKI, question upper GI bleed.  12/15 - in-hospital cardiac arrest  RD working remotely. Consult received for trickle tube feeds only. Per PCCM NP, pt with volume overload and may require CRRT. UOP improved. Nephrology has been consulted. Per notes, mental status improved and hematemesis thought to be from tongue bite during seizure rather than GI bleed. Pt still with hyperglycemia; insulin drip has been restarted.  Pt with OG tube in stomach, currently to low intermittent suction with bloody output per nursing notes. Discussed plan for trickle feeds with RN.  Pt with generalized edema per nursing note.  Unable to obtain diet and weight history at this time. Reviewed weight history in chart. Pt with a 6.9 kg weight loss since 04/23/21. This is a 8.5% weight loss in 1 year which is not clinically significant for timeframe. However, weight loss puts pt at risk for  malnutrition.  Admit weight: 75 kg Current weight: 74.7 kg  Patient is currently intubated on ventilator support MV: 10.7 L/min Temp (24hrs), Avg:97.8 F (36.6 C), Min:97.3 F (36.3 C), Max:98.6 F (37 C) BP (cuff): 97/52 MAP (cuff): 66  Drips: Precedex Heparin Insulin Levophed: 1.5 mcg/min  Medications reviewed and include: colace, IV protonix, miralax, IV abx  Labs reviewed: BUN 37, creatinine 4.41, phosphorus 6.4, WBC 14.9, hemoglobin 9.3, platelets 115 CBG's: 104-319 x 24 hours  UOP: 175 ml x 24 hours I/O's: +11.1 L since admit  NUTRITION - FOCUSED PHYSICAL EXAM:  Unable to complete at this time. RD working remotely.  Diet Order:   Diet Order     None       EDUCATION NEEDS:   Not appropriate for education at this time  Skin:  Skin Assessment: Skin Integrity Issues: Stage I: sacrum  Last BM:  04/12/22  Height:   Ht Readings from Last 1 Encounters:  04/10/22 '5\' 3"'$  (1.6 m)    Weight:   Wt Readings from Last 1 Encounters:  04/12/22 74.7 kg    Ideal Body Weight:  52.3 kg  BMI:  Body mass index is 29.17 kg/m.  Estimated Nutritional Needs:   Kcal:  1800-2000  Protein:  100-115 grams  Fluid:  >1.8 L    Gustavus Bryant, MS, RD, LDN Inpatient Clinical Dietitian Please see AMiON for contact information.

## 2022-04-12 NOTE — Progress Notes (Signed)
NAME:  Deborah Neal, MRN:  [id number], DOB:  13-Oct-1950, LOS: 3 ADMISSION DATE:  04/09/2022, CONSULTATION DATE: 12/15 REFERRING MD: Dr. Vanita Panda EDP, CHIEF COMPLAINT: Cardiac arrest  History of Present Illness:  71 year old female with past medical history as below, which is significant for hypertension, hyperlipidemia, type 2 diabetes mellitus, and asthma.  She was in her usual state of health until approximately 5:30 AM on 12/15 when she was seen by granddaughter.  Family attempted to contact her via phone around 7 AM and she did not answer.  When they later went to check on her around 10 AM she was unresponsive in her bed with red emesis seen around her.  Upon arrival to the fire department the patient was unresponsive but did have pulse and respirations. Then suffered seizure.  Upon EMS arrival patient lost pulses and CPR was initiated.   CPR less than 5 minutes.  Patient demonstrated seizure-like activity en route to [facility] emergency department.  Improved with benzodiazepine administration.  Upon arrival to emergency department the patient was intubated.  She remained unresponsive at the time PCCM was asked to admit.  Pertinent  Medical History   has a past medical history of ALLERGIC RHINITIS (01/29/2007), Arthritis, ASTHMA (01/29/2007), ASTHMA, WITH ACUTE EXACERBATION (06/24/2008), BACK PAIN (01/29/2009), CHEST PAIN (06/24/2008), Family history of adverse reaction to anesthesia, GERD (gastroesophageal reflux disease), History of shingles, HYPERLIPIDEMIA (01/29/2007), HYPERTENSION (01/29/2007), OSTEOPENIA (10/16/2007), and Type II or unspecified type diabetes mellitus without mention of complication, uncontrolled (11/29/2013).   Significant Hospital Events: Including procedures, antibiotic start and stop dates in addition to other pertinent events   12/15 admit to [place] s/p cardiac arrest 12/17 MRI - neg acute, stable moderate atrophy and white matter disease. This likely reflects the sequela of  chronic microvascular ischemia.  Interim History / Subjective:  Off propofol, on low dose precedex Mental status improved, following some commands  Making some urine  Scr continues to trend up  On low dose norepi   Objective   Blood pressure (!) 106/55, pulse (!) 103, temperature 97.6 F (36.4 C), temperature source Axillary, resp. rate (!) 0, height '5\' 3"'$  (1.6 m), weight 74.7 kg, SpO2 97 %.    Vent Mode: PRVC FiO2 (%):  [40 %-50 %] 40 % Set Rate:  [24 bmp] 24 bmp Vt Set:  [420 mL] 420 mL PEEP:  [8 cmH20] 8 cmH20 Plateau Pressure:  [22 cmH20-23 cmH20] 23 cmH20   Intake/Output Summary (Last 24 hours) at 04/12/2022 0927 Last data filed at 04/12/2022 0800 Gross per 24 hour  Intake 1924.97 ml  Output 196 ml  Net 1728.97 ml   Filed Weights   04/10/22 0500 04/11/22 0500 04/12/22 0304  Weight: 73.7 kg 76.1 kg 74.7 kg    Examination:    Physical exam: General: ill appearing female NAD on vent  HEENT: [place]/AT, eyes anicteric.  ETT and OGT in place Neuro: RASS -1 but agitated with stimulation. Does not follow commands, coughing  Chest:  resps even non labored on vent.  Heart: Tachycardic, regular rhythm, no murmurs or gallops Abdomen: Soft, nondistended, bowel sounds present Skin: No rash  Resolved Hospital Problem list     Assessment & Plan:  S/p out of hospital PEA cardiac arrest S/p in-hospital V-fib/V. tach cardiac arrest in the setting of severe hypokalemia Possible massive PE -- Echocardiogram showed normal EF with no wall motion abnormalities but diastolic dysfunction and slightly reduced RV function ?RV clot - s/p TNK Patient completed normothermia protocol Continue telemetry  monitoring, remained in sinus tachycardia Continue aggressive electrolyte replacement Continue heparin infusion Initial head CT was negative for acute findings  Acute respiratory failure with hypoxia Aspiration pneumonia Cultures have been negative VAP prevention bundle in place PAD  protocol with Precedex with RASS goal -1 Continue IV Unasyn D3/7  Demand cardiac ischemia - Due to CPR, acute illness and V-fib arrest Trend troponin - trending down slowly  Echocardiogram showed no wall motion abnormalities Continue telemetry monitoring  Seizure-like activity Continue Keppra EEG negative for seizures   Acute kidney injury due to ischemic ATN Scr trending up but UOP improved  Will ask neurology to see  Avoid nephrotoxic agents  ?GIB v tongue bite during seizure  Continue PPI daily Monitor H&H Consider GI input if ongoing   Hypokalemia/hypophosphatemia/hypomagnesemia/hypernatremia Continue aggressive electrolyte replacement and closely monitor  Diabetic ketoacidosis Patient presented with blood sugar over 1000 Gap closed but ongoing hyperglycemia with sugars >300 - significant insulin needs at baseline  Resume insulin gtt   Lactic acidosis - improving   Acute metabolic/anoxic encephalopathy ?anoxic injury  PAD protocol - precedex  MRI neg  Daily WUA  Wean sedation as able   Best Practice (right click and "Reselect all SmartList Selections" daily)   Diet/type: NPO DVT prophylaxis: Systemic heparin GI prophylaxis: Protonix Lines: N/A Foley:  Yes, and it is still needed Code Status:  full code Last date of multidisciplinary goals of care discussion [10/18: Patient's daughter was updated at bedside, would like to continue full scope of care  Labs   CBC: Recent Labs  Lab 04/09/22 1100 04/09/22 1158 04/09/22 1450 04/10/22 0642 04/10/22 1035 04/10/22 2359 04/12/22 0503  WBC 21.3*  --   --  18.0*  --  19.3* 14.9*  NEUTROABS 17.9*  --   --   --   --   --   --   HGB 12.1   < > 14.6 13.4 13.9 11.6* 9.3*  HCT 44.5   < > 43.0 42.6 41.0 35.9* 28.8*  MCV 86.7  --   --  74.5*  --  73.6* 72.2*  PLT 232  --   --  204  --  153 115*   < > = values in this interval not displayed.    Basic Metabolic Panel: Recent Labs  Lab 04/09/22 1330  04/09/22 1450 04/09/22 1833 04/10/22 0244 04/10/22 0642 04/10/22 1035 04/10/22 2359 04/12/22 0503  NA 142   < > 147* 148* 150* 151* 148* 145  K 3.3*   < > 2.0* 3.5 2.5* 2.8* 3.3* 4.1  CL 102   < > 106 110 111  --  110 110  CO2 15*   < > '22 27 27  '$ --  26 24  GLUCOSE 1,194*   < > 821* 466* 414*  --  165* 322*  BUN 22   < > '21 20 22  '$ --  24* 37*  CREATININE 2.06*   < > 1.99* 1.79* 1.85*  --  2.90* 4.41*  CALCIUM 9.1   < > 9.6 9.9 9.8  --  8.6* 7.7*  MG 2.5*  --   --   --  2.2  --  1.8 1.9  PHOS  --   --   --   --  <1.0*  --  3.5 6.4*   < > = values in this interval not displayed.   GFR: Estimated Creatinine Clearance: 11.5 mL/min (A) (by C-G formula based on SCr of 4.41 mg/dL (H)). Recent Labs  Lab 04/09/22 1100  04/09/22 1833 04/10/22 0642 04/10/22 1246 04/10/22 2359 04/12/22 0503  WBC 21.3*  --  18.0*  --  19.3* 14.9*  LATICACIDVEN >9.0* 7.4*  --  4.9* 2.8*  --     Liver Function Tests: Recent Labs  Lab 04/09/22 1330  AST 51*  ALT 39  ALKPHOS 136*  BILITOT 0.8  PROT 6.1*  ALBUMIN 3.3*   No results for input(s): "LIPASE", "AMYLASE" in the last 168 hours. No results for input(s): "AMMONIA" in the last 168 hours.  ABG    Component Value Date/Time   PHART 7.459 (H) 04/10/2022 1035   PCO2ART 35.1 04/10/2022 1035   PO2ART 68 (L) 04/10/2022 1035   HCO3 24.9 04/10/2022 1035   TCO2 26 04/10/2022 1035   ACIDBASEDEF 4.0 (H) 04/09/2022 1450   O2SAT 94 04/10/2022 1035     Coagulation Profile: Recent Labs  Lab 04/09/22 1100  INR 1.2    Cardiac Enzymes: Recent Labs  Lab 04/09/22 1330  CKTOTAL 190    HbA1C: Hgb A1c MFr Bld  Date/Time Value Ref Range Status  07/22/2021 04:05 PM 13.5 (H) 4.6 - 6.5 % Final    Comment:    Glycemic Control Guidelines for People with Diabetes:Non Diabetic:  <6%Goal of Therapy: <7%Additional Action Suggested:  >8%   03/10/2021 12:17 PM 10.9 Repeated and verified X2. (H) 4.6 - 6.5 % Final    Comment:    Glycemic Control  Guidelines for People with Diabetes:Non Diabetic:  <6%Goal of Therapy: <7%Additional Action Suggested:  >8%     CBG: Recent Labs  Lab 04/11/22 1224 04/11/22 1607 04/11/22 1939 04/11/22 2349 04/11/22 2354  GLUCAP 104* 208* 228* 264* 261*    This patient is critically ill with multiple organ system failure which requires frequent high complexity decision making, assessment, support, evaluation, and titration of therapies. This was completed through the application of advanced monitoring technologies and extensive interpretation of multiple databases.  During this encounter critical care time was devoted to patient care services described in this note for 32 minutes.  Nickolas Madrid, NP Pulmonary/Critical Care Medicine  04/12/2022  9:27 AM

## 2022-04-12 NOTE — Progress Notes (Signed)
eLink Physician-Brief Progress Note Patient Name: Deborah Neal DOB: Jul 09, 1950 MRN: [id number]   Date of Service  04/12/2022  HPI/Events of Note  CBG 319  On vent with no TF  eICU Interventions  Will give additional 8 units insulin aspart She will be receiving Levemir at 20 in the next few hours Discussed with bedside RN     Intervention Category Intermediate Interventions: Hyperglycemia - evaluation and treatment  [name] 04/12/2022, 5:18 AM

## 2022-04-12 NOTE — Progress Notes (Signed)
Inpatient Diabetes Program Recommendations  AACE/ADA: New Consensus Statement on Inpatient Glycemic Control (2015)  Target Ranges:  Prepandial:   less than 140 mg/dL      Peak postprandial:   less than 180 mg/dL (1-2 hours)      Critically ill patients:  140 - 180 mg/dL   Lab Results  Component Value Date   GLUCAP 319 (H) 04/12/2022   HGBA1C 13.5 (H) 07/22/2021    Review of Glycemic Control  Latest Reference Range & Units 04/11/22 12:24 04/11/22 16:07 04/11/22 19:39 04/11/22 23:49 04/11/22 23:54 04/12/22 04:59  Glucose-Capillary 70 - 99 mg/dL 104 (H) 208 (H) 228 (H) 264 (H) 261 (H) 319 (H)   Diabetes history: DM 2 Outpatient Diabetes medications:  Jardiance 25 mg daily Toujeo 100 units daily U500 60 units tid with meals Current orders for Inpatient glycemic control:  IV insulin- restarted this AM  Inpatient Diabetes Program Recommendations:    Agree with restart of IV insulin.  Patient was on high dose insulin at home prior to admit.  Will likely need significant insulin for transition, however agree that IV insulin is likely safest at this time.    Thanks,  Adah Perl, RN, BC-ADM Inpatient Diabetes Coordinator Pager [phone]  (8a-5p)

## 2022-04-12 NOTE — Progress Notes (Signed)
eLink Physician-Brief Progress Note Patient Name: Deborah Neal DOB: 02/03/1951 MRN: [id number]   Date of Service  04/12/2022  HPI/Events of Note  Current CBG 264 with prior CBG 228,  Just came off [facility] This AM, Asking to see if scale can be adjusted, On Levamir 15u twice a day, No TF,   On vent , Asking for Hgb A1C as well  eICU Interventions  Increase Levemir to 20 BID. Give additional 5 units now A1C ordered     Intervention Category Intermediate Interventions: Hyperglycemia - evaluation and treatment  Shona Needles Gabryela Kimbrell 04/12/2022, 12:11 AM

## 2022-04-12 NOTE — Progress Notes (Signed)
ANTICOAGULATION CONSULT NOTE  Pharmacy Consult for heparin  Indication: pulmonary embolus and clot right ventricle  Allergies  Allergen Reactions   Shingrix [Zoster Vac Recomb Adjuvanted]     Allergy to original live zoster - rash    Patient Measurements: Height: '5\' 3"'$  (160 cm) Weight: 74.7 kg (164 lb 10.9 oz) IBW/kg (Calculated) : 52.4 Heparin Dosing Weight: 68kg  Vital Signs: Temp: 100.6 F (38.1 C) (12/18 1930) Temp Source: Bladder (12/18 1930) BP: 104/60 (12/18 1930) Pulse Rate: 99 (12/18 1930)  Labs: Recent Labs    04/10/22 0642 04/10/22 1000 04/10/22 1035 04/10/22 1200 04/10/22 2014 04/10/22 2359 04/11/22 0331 04/12/22 0503 04/12/22 1344 04/12/22 2133  HGB 13.4  --  13.9  --   --  11.6*  --  9.3*  --   --   HCT 42.6  --  41.0  --   --  35.9*  --  28.8*  --   --   PLT 204  --   --   --   --  153  --  115*  --   --   HEPARINUNFRC 1.06*  --   --   --    < >  --    < > 0.15* 0.19* 0.37  CREATININE 1.85*  --   --   --   --  2.90*  --  4.41*  --   --   TROPONINIHS  --  1,355*  --  1,133*  --   --   --   --   --   --    < > = values in this interval not displayed.     Estimated Creatinine Clearance: 11.5 mL/min (A) (by C-G formula based on SCr of 4.41 mg/dL (H)).  Assessment: Pt was admitted for being unresponsive with CPR administered. Pt went into cardiac arrest and found to have thrombus in right ventricle on ECHO. TNK was administered. Heparin gtt ordered for anticoagulation post TNK.   Heparin level therapeutic (0.37) on infusion at 750 units/hr. No bleeding noted.  Goal of Therapy:  Heparin level 0.3-0.7 units/ml Monitor platelets by anticoagulation protocol: Yes   Plan:  Continue heparin at 750 units/hr Will f/u a.m. heparin level to confirm therapeutic.  Sherlon Handing, PharmD, BCPS Please see amion for complete clinical pharmacist phone list. 04/12/2022 10:08 PM

## 2022-04-12 NOTE — Progress Notes (Signed)
ANTICOAGULATION CONSULT NOTE  Pharmacy Consult for heparin  Indication: pulmonary embolus and clot right ventricle  Allergies  Allergen Reactions   Shingrix [Zoster Vac Recomb Adjuvanted]     Allergy to original live zoster - rash    Patient Measurements: Height: '5\' 3"'$  (160 cm) Weight: 74.7 kg (164 lb 10.9 oz) IBW/kg (Calculated) : 52.4 Heparin Dosing Weight: 68kg  Vital Signs: BP: 98/52 (12/18 1351) Pulse Rate: 102 (12/18 1351)  Labs: Recent Labs    04/09/22 1932 04/09/22 2126 04/10/22 0244 04/10/22 0642 04/10/22 1000 04/10/22 1035 04/10/22 1200 04/10/22 2014 04/10/22 2359 04/11/22 0331 04/11/22 0632 04/12/22 0503 04/12/22 1344  HGB  --   --    < > 13.4  --  13.9  --   --  11.6*  --   --  9.3*  --   HCT  --   --    < > 42.6  --  41.0  --   --  35.9*  --   --  28.8*  --   PLT  --   --   --  204  --   --   --   --  153  --   --  115*  --   APTT 24 42*  --   --   --   --   --   --   --   --   --   --   --   HEPARINUNFRC  --   --   --  1.06*  --   --   --    < >  --    < > 0.37 0.15* 0.19*  CREATININE  --   --    < > 1.85*  --   --   --   --  2.90*  --   --  4.41*  --   TROPONINIHS  --   --   --   --  1,355*  --  1,133*  --   --   --   --   --   --    < > = values in this interval not displayed.     Estimated Creatinine Clearance: 11.5 mL/min (A) (by C-G formula based on SCr of 4.41 mg/dL (H)).  Assessment: Pt was admitted for being unresponsive with CPR administered. Pt went into cardiac arrest and found to have thrombus in right ventricle on ECHO. TNK was administered. Heparin gtt ordered for anticoagulation post TNK.   Goal of Therapy:  Heparin level 0.3-0.7 units/ml Monitor platelets by anticoagulation protocol: Yes   Plan:  Heparin 2000 units IV once Increase heparin to 750 units/hr Check 8hr heparin level at [place], PharmD Clinical Pharmacist

## 2022-04-12 NOTE — Progress Notes (Signed)
ANTICOAGULATION CONSULT NOTE  Pharmacy Consult for heparin  Indication: pulmonary embolus and clot right ventricle  Allergies  Allergen Reactions   Shingrix [Zoster Vac Recomb Adjuvanted]     Allergy to original live zoster - rash    Patient Measurements: Height: '5\' 3"'$  (160 cm) Weight: 74.7 kg (164 lb 10.9 oz) IBW/kg (Calculated) : 52.4 Heparin Dosing Weight: 68kg  Vital Signs: Temp: 97.6 F (36.4 C) (12/18 0000) Temp Source: Axillary (12/18 0000) BP: 135/77 (12/18 0600) Pulse Rate: 99 (12/18 0600)  Labs: Recent Labs    04/09/22 1100 04/09/22 1158 04/09/22 1330 04/09/22 1450 04/09/22 1932 04/09/22 2126 04/10/22 0244 04/10/22 0642 04/10/22 1000 04/10/22 1035 04/10/22 1200 04/10/22 2014 04/10/22 2359 04/11/22 0331 04/11/22 0632 04/12/22 0503  HGB 12.1   < >  --    < >  --   --   --  13.4  --  13.9  --   --  11.6*  --   --  9.3*  HCT 44.5   < >  --    < >  --   --   --  42.6  --  41.0  --   --  35.9*  --   --  28.8*  PLT 232  --   --   --   --   --   --  204  --   --   --   --  153  --   --  115*  APTT  --   --   --   --  24 42*  --   --   --   --   --   --   --   --   --   --   LABPROT 15.0  --   --   --   --   --   --   --   --   --   --   --   --   --   --   --   INR 1.2  --   --   --   --   --   --   --   --   --   --   --   --   --   --   --   HEPARINUNFRC  --   --   --   --   --   --   --  1.06*  --   --   --    < >  --  0.41 0.37 0.15*  CREATININE  --   --  2.06*   < >  --   --    < > 1.85*  --   --   --   --  2.90*  --   --  4.41*  CKTOTAL  --   --  190  --   --   --   --   --   --   --   --   --   --   --   --   --   TROPONINIHS  --   --  97*  --   --   --   --   --  1,355*  --  1,133*  --   --   --   --   --    < > = values in this interval not displayed.     Estimated Creatinine Clearance: 11.5 mL/min (A) (by C-G formula based on  SCr of 4.41 mg/dL (H)).  Assessment: Pt was admitted for being unresponsive with CPR administered. Pt went into cardiac  arrest tonight. Found to have thrombus in right ventricle on ECHO. TNK was administered. Heparin gtt ordered for anticoagulation post TNK. We will start low dose heparin when PTT<80  12/18 AM update:  Heparin level low    Goal of Therapy:  Heparin level 0.3-0.7 units/ml Monitor platelets by anticoagulation protocol: Yes   Plan:  Inc heparin to 650 units/hr 1400 heparin level  Narda Bonds, PharmD, BCPS Clinical Pharmacist Phone: [phone]

## 2022-04-13 ENCOUNTER — Inpatient Hospital Stay (HOSPITAL_COMMUNITY): Payer: Medicare PPO

## 2022-04-13 DIAGNOSIS — G9340 Encephalopathy, unspecified: Secondary | ICD-10-CM

## 2022-04-13 DIAGNOSIS — I469 Cardiac arrest, cause unspecified: Secondary | ICD-10-CM | POA: Diagnosis not present

## 2022-04-13 LAB — GLUCOSE, CAPILLARY
Glucose-Capillary: 132 mg/dL — ABNORMAL HIGH (ref 70–99)
Glucose-Capillary: 148 mg/dL — ABNORMAL HIGH (ref 70–99)
Glucose-Capillary: 154 mg/dL — ABNORMAL HIGH (ref 70–99)
Glucose-Capillary: 155 mg/dL — ABNORMAL HIGH (ref 70–99)
Glucose-Capillary: 162 mg/dL — ABNORMAL HIGH (ref 70–99)
Glucose-Capillary: 162 mg/dL — ABNORMAL HIGH (ref 70–99)
Glucose-Capillary: 166 mg/dL — ABNORMAL HIGH (ref 70–99)
Glucose-Capillary: 166 mg/dL — ABNORMAL HIGH (ref 70–99)
Glucose-Capillary: 167 mg/dL — ABNORMAL HIGH (ref 70–99)
Glucose-Capillary: 172 mg/dL — ABNORMAL HIGH (ref 70–99)
Glucose-Capillary: 180 mg/dL — ABNORMAL HIGH (ref 70–99)
Glucose-Capillary: 184 mg/dL — ABNORMAL HIGH (ref 70–99)
Glucose-Capillary: 190 mg/dL — ABNORMAL HIGH (ref 70–99)
Glucose-Capillary: 190 mg/dL — ABNORMAL HIGH (ref 70–99)
Glucose-Capillary: 192 mg/dL — ABNORMAL HIGH (ref 70–99)
Glucose-Capillary: 203 mg/dL — ABNORMAL HIGH (ref 70–99)
Glucose-Capillary: 207 mg/dL — ABNORMAL HIGH (ref 70–99)
Glucose-Capillary: 210 mg/dL — ABNORMAL HIGH (ref 70–99)
Glucose-Capillary: 211 mg/dL — ABNORMAL HIGH (ref 70–99)
Glucose-Capillary: 222 mg/dL — ABNORMAL HIGH (ref 70–99)
Glucose-Capillary: 230 mg/dL — ABNORMAL HIGH (ref 70–99)

## 2022-04-13 LAB — PHOSPHORUS: Phosphorus: 4.3 mg/dL (ref 2.5–4.6)

## 2022-04-13 LAB — BASIC METABOLIC PANEL
Anion gap: 12 (ref 5–15)
BUN: 47 mg/dL — ABNORMAL HIGH (ref 8–23)
CO2: 23 mmol/L (ref 22–32)
Calcium: 7.6 mg/dL — ABNORMAL LOW (ref 8.9–10.3)
Chloride: 110 mmol/L (ref 98–111)
Creatinine, Ser: 5.57 mg/dL — ABNORMAL HIGH (ref 0.44–1.00)
GFR, Estimated: 8 mL/min — ABNORMAL LOW (ref >60)
Glucose, Bld: 197 mg/dL — ABNORMAL HIGH (ref 70–99)
Potassium: 3.1 mmol/L — ABNORMAL LOW (ref 3.5–5.1)
Sodium: 145 mmol/L (ref 135–145)

## 2022-04-13 LAB — CBC
HCT: 26.8 % — ABNORMAL LOW (ref 36.0–46.0)
Hemoglobin: 8.4 g/dL — ABNORMAL LOW (ref 12.0–15.0)
MCH: 23.3 pg — ABNORMAL LOW (ref 26.0–34.0)
MCHC: 31.3 g/dL (ref 30.0–36.0)
MCV: 74.4 fL — ABNORMAL LOW (ref 80.0–100.0)
Platelets: 107 10*3/uL — ABNORMAL LOW (ref 150–400)
RBC: 3.6 MIL/uL — ABNORMAL LOW (ref 3.87–5.11)
RDW: 15.3 % (ref 11.5–15.5)
WBC: 12.7 10*3/uL — ABNORMAL HIGH (ref 4.0–10.5)
nRBC: 0.2 % (ref 0.0–0.2)

## 2022-04-13 LAB — URINALYSIS, ROUTINE W REFLEX MICROSCOPIC
Bilirubin Urine: NEGATIVE
Glucose, UA: 50 mg/dL — AB
Ketones, ur: NEGATIVE mg/dL
Nitrite: NEGATIVE
Protein, ur: 30 mg/dL — AB
RBC / HPF: >50 RBC/hpf — ABNORMAL HIGH (ref 0–5)
Specific Gravity, Urine: 1.008 (ref 1.005–1.030)
pH: 5 (ref 5.0–8.0)

## 2022-04-13 LAB — HEMOGLOBIN AND HEMATOCRIT, BLOOD
HCT: 25.5 % — ABNORMAL LOW (ref 36.0–46.0)
Hemoglobin: 8 g/dL — ABNORMAL LOW (ref 12.0–15.0)

## 2022-04-13 LAB — HEMOGLOBIN A1C
Hgb A1c MFr Bld: >15.5 % — ABNORMAL HIGH (ref 4.8–5.6)
Mean Plasma Glucose: >398 mg/dL

## 2022-04-13 LAB — MAGNESIUM: Magnesium: 2 mg/dL (ref 1.7–2.4)

## 2022-04-13 LAB — HEPARIN LEVEL (UNFRACTIONATED): Heparin Unfractionated: 0.29 IU/mL — ABNORMAL LOW (ref 0.30–0.70)

## 2022-04-13 LAB — PROTEIN / CREATININE RATIO, URINE
Creatinine, Urine: 34 mg/dL
Protein Creatinine Ratio: 1.56 mg/mg{Cre} — ABNORMAL HIGH (ref 0.00–0.15)
Total Protein, Urine: 53 mg/dL

## 2022-04-13 LAB — TRIGLYCERIDES: Triglycerides: 79 mg/dL (ref <150)

## 2022-04-13 MED ORDER — POTASSIUM CHLORIDE 20 MEQ PO PACK
40.0000 meq | PACK | Freq: Once | ORAL | Status: AC
  Administered 2022-04-13: 40 meq
  Filled 2022-04-13: qty 2

## 2022-04-13 MED ORDER — CLOTRIMAZOLE 1 % VA CREA
1.0000 | TOPICAL_CREAM | Freq: Every day | VAGINAL | Status: AC
  Administered 2022-04-13 – 2022-04-18 (×7): 1 via VAGINAL
  Filled 2022-04-13 (×2): qty 45

## 2022-04-13 MED ORDER — PROSOURCE TF20 ENFIT COMPATIBL EN LIQD
60.0000 mL | Freq: Every day | ENTERAL | Status: DC
  Administered 2022-04-13 – 2022-04-17 (×5): 60 mL
  Filled 2022-04-13 (×5): qty 60

## 2022-04-13 MED ORDER — PANTOPRAZOLE SODIUM 40 MG IV SOLR
40.0000 mg | Freq: Two times a day (BID) | INTRAVENOUS | Status: DC
  Administered 2022-04-13 – 2022-04-15 (×6): 40 mg via INTRAVENOUS
  Filled 2022-04-13 (×6): qty 10

## 2022-04-13 MED ORDER — VITAL 1.5 CAL PO LIQD
1000.0000 mL | ORAL | Status: DC
  Administered 2022-04-14 – 2022-04-17 (×4): 1000 mL

## 2022-04-13 MED ORDER — FUROSEMIDE 10 MG/ML IJ SOLN
80.0000 mg | Freq: Once | INTRAMUSCULAR | Status: AC
  Administered 2022-04-13: 80 mg via INTRAVENOUS
  Filled 2022-04-13: qty 8

## 2022-04-13 NOTE — TOC Initial Note (Signed)
Transition of Care [facility]) - Initial/Assessment Note    Patient Details  Name: Deborah Neal MRN: [id number] Date of Birth: [birth year]  Transition of Care [facility]) CM/SW Contact:    Tom-Johnson, Renea Ee, RN Phone Number: 04/13/2022, 4:17 PM  Clinical Narrative:                  CM spoke with patient's daughter, Caren Griffins at bedside. Patient is admitted for Cardiac Arrest after being found unresponsive. Currently intubated and on drips. Creatinine elevated from 2.06- 5.57, Nephrology consulted.  Caren Griffins states patient has four children and lives with her grand daughter. Patient does not drive, family transports. Does not have DME's at home.  PCP is Biagio Borg, MD and uses [facility] on [facility].  No TOC needs or recommendations noted at this time. TOC will continue to follow as patient progresses with care towards discharge.       Barriers to Discharge: Continued Medical Work up   Patient Goals and CMS Choice Patient states their goals for this hospitalization and ongoing recovery are:: To return home      Expected Discharge Plan and Services     Discharge Planning Services: CM Consult   Living arrangements for the past 2 months: Single Family Home                                      Prior Living Arrangements/Services Living arrangements for the past 2 months: Single Family Home Lives with:: Adult Children ([place] daughter) Patient language and need for interpreter reviewed:: Yes Do you feel safe going back to the place where you live?: Yes      Need for Family Participation in Patient Care: Yes (Comment) Care giver support system in place?: Yes (comment)   Criminal Activity/Legal Involvement Pertinent to Current Situation/Hospitalization: No - Comment as needed  Activities of Daily Living      Permission Sought/Granted Permission sought to share information with : Case Manager, Family Supports Permission granted to share information with  : Yes, Verbal Permission Granted              Emotional Assessment Appearance:: Appears stated age Attitude/Demeanor/Rapport: Unable to Assess Affect (typically observed): Unable to Assess Orientation: : Oriented to Self Alcohol / Substance Use: Not Applicable Psych Involvement: No (comment)  Admission diagnosis:  Cardiac arrest ([place]) [I46.9] Seizure ([place]) [R56.9] Hyperglycemia [R73.9] Acute respiratory failure, unspecified whether with hypoxia or hypercapnia ([place]) [J96.00] Patient Active Problem List   Diagnosis Date Noted   Encephalopathy acute 04/13/2022   Cardiac arrest ([place]) 04/09/2022   Acute respiratory failure with hypoxia ([facility]) 04/09/2022   Lactic acidosis 04/09/2022   Seizure ([place]) 04/09/2022   Type 2 diabetes mellitus without complication, with long-term current use of insulin ([place]) 04/09/2022   Shock ([place]) 04/09/2022   Encounter for well adult exam with abnormal findings 01/22/2022   Vitamin D deficiency 01/22/2022   HLD (hyperlipidemia) 07/08/2020   COVID-19 virus infection 08/31/2019   Renal insufficiency 08/30/2019   Low back pain 08/30/2019   Upper back pain on left side 08/30/2019   Upper respiratory infection 08/30/2019   Vaginitis 10/27/2018   Cough 06/29/2018   Asthma exacerbation 06/29/2018   Right leg weakness 06/15/2017   Toe pain, right 02/25/2017   Hyperglycemia 78/46/9629   Acute metabolic encephalopathy 52/84/1324   Thrombocytopenia ([place]) 02/21/2017   Pancytopenia ([employer]) 02/21/2017   Preventative health care 09/28/2016  Dehydration    Hyperglycemic crisis in diabetes mellitus ([place]) 07/06/2016   AKI (acute kidney injury) ([place]) 07/06/2016   Hyperkalemia 07/06/2016   S/P laparoscopic cholecystectomy 09/19/2015   Abnormal chest CT 06/21/2014   Diabetes mellitus type II, non insulin dependent ([place]) 11/29/2013   Obesity, unspecified 09/19/2012   Plantar fasciitis, bilateral 06/08/2011   Backache 01/29/2009   Osteopenia 10/16/2007    Essential hypertension 01/29/2007   Asthma 01/29/2007   PCP:  Biagio Borg, MD Pharmacy:   [facility] [place], [place] - [address] [place] [address] Phone: [phone] Fax: [phone]  [facility] ([place], [place] [facility] [address] [place] [address] [place] [address] Phone: [phone] Fax: [phone]     Social Determinants of Health (SDOH) Interventions    Readmission Risk Interventions     No data to display

## 2022-04-13 NOTE — Inpatient Diabetes Management (Signed)
Inpatient Diabetes Program Recommendations  AACE/ADA: New Consensus Statement on Inpatient Glycemic Control (2015)  Target Ranges:  Prepandial:   less than 140 mg/dL      Peak postprandial:   less than 180 mg/dL (1-2 hours)      Critically ill patients:  140 - 180 mg/dL    Latest Reference Range & Units 04/13/22 01:28 04/13/22 02:26 04/13/22 03:35 04/13/22 04:22 04/13/22 05:19 04/13/22 06:29 04/13/22 07:35 04/13/22 08:41  Glucose-Capillary 70 - 99 mg/dL 132 (H) 180 (H) 192 (H) 166 (H) 162 (H) 190 (H) 203 (H) 207 (H)   Review of Glycemic Control  Diabetes history: DM2 Outpatient Diabetes medications: Jardiance 25 mg daily, Toujeo 100 units daily, Humulin R U500 60 units TID with meals Current orders for Inpatient glycemic control: IV insulin per Phase 2 of ICU Glycemic control order set  Inpatient Diabetes Program Recommendations:    Insulin: Once parameters are met, transition from Phase 2 to Phase 3 of ICU Glycemic control order set.   Initiate Phase 3 Adult ICU Glycemic Control Protocol -Transition when all of the following are met:  the insulin infusion rate is < 8 units / hour  tube feeds are at goal rate and stable   there are four consecutive CBG readings < 180 mg/dL  Thanks,  Barnie Alderman, RN, MSN, [place] Diabetes Coordinator Inpatient Diabetes Program [phone] (Team Pager from 8am to [place])

## 2022-04-13 NOTE — Progress Notes (Addendum)
NAME:  Deborah Neal, MRN:  [id number], DOB:  01-Apr-1951, LOS: 4 ADMISSION DATE:  04/09/2022, CONSULTATION DATE: 12/15 REFERRING MD: Dr. Vanita Panda EDP, CHIEF COMPLAINT: Cardiac arrest  History of Present Illness:  71 year old female with past medical history as below, which is significant for hypertension, hyperlipidemia, type 2 diabetes mellitus, and asthma.  She was in her usual state of health until approximately 5:30 AM on 12/15 when she was seen by granddaughter.  Family attempted to contact her via phone around 7 AM and she did not answer.  When they later went to check on her around 10 AM she was unresponsive in her bed with red emesis seen around her.  Upon arrival to the fire department the patient was unresponsive but did have pulse and respirations. Then suffered seizure.  Upon EMS arrival patient lost pulses and CPR was initiated.   CPR less than 5 minutes.  Patient demonstrated seizure-like activity en route to [facility] emergency department.  Improved with benzodiazepine administration.  Upon arrival to emergency department the patient was intubated.  She remained unresponsive at the time PCCM was asked to admit.  Pertinent  Medical History   has a past medical history of ALLERGIC RHINITIS (01/29/2007), Arthritis, ASTHMA (01/29/2007), ASTHMA, WITH ACUTE EXACERBATION (06/24/2008), BACK PAIN (01/29/2009), CHEST PAIN (06/24/2008), Family history of adverse reaction to anesthesia, GERD (gastroesophageal reflux disease), History of shingles, HYPERLIPIDEMIA (01/29/2007), HYPERTENSION (01/29/2007), OSTEOPENIA (10/16/2007), and Type II or unspecified type diabetes mellitus without mention of complication, uncontrolled (11/29/2013).   Significant Hospital Events: Including procedures, antibiotic start and stop dates in addition to other pertinent events   12/15 admit to [facility] s/p cardiac arrest 12/17 MRI - neg acute, stable moderate atrophy and white matter disease. This likely reflects the sequela of  chronic microvascular ischemia.  Interim History / Subjective:  Sedated on precedex Mental status improving.  Scr continues to trend up  UOP poor On low dose norepi   Objective   Blood pressure 99/67, pulse 95, temperature (!) 97.5 F (36.4 C), temperature source Bladder, resp. rate (!) 29, height '5\' 3"'$  (1.6 m), weight 78.5 kg, SpO2 96 %.    Vent Mode: PSV;CPAP FiO2 (%):  [40 %] 40 % Set Rate:  [24 bmp] 24 bmp Vt Set:  [420 mL] 420 mL PEEP:  [8 cmH20] 8 cmH20 Pressure Support:  [10 cmH20] 10 cmH20 Plateau Pressure:  [21 cmH20-28 cmH20] 24 cmH20   Intake/Output Summary (Last 24 hours) at 04/13/2022 0830 Last data filed at 04/13/2022 0600 Gross per 24 hour  Intake 2128.46 ml  Output 450 ml  Net 1678.46 ml    Filed Weights   04/11/22 0500 04/12/22 0304 04/13/22 0251  Weight: 76.1 kg 74.7 kg 78.5 kg    Examination: General:elderly female on vent HEENT: [place]/AT, PERRL Neuro: RASS -1 following simple commands.  Chest:  Clear bilateral breath sounds. PSV 10/5 with increased RR.  Heart: RRR, no MRG Abdomen: Soft, NT, ND Skin: Grossly intact  Resolved Hospital Problem list     Assessment & Plan:  S/p out of hospital PEA cardiac arrest S/p in-hospital V-fib/V. tach cardiac arrest in the setting of severe hypokalemia Possible massive PE -- Echocardiogram showed normal EF with no wall motion abnormalities but diastolic dysfunction and slightly reduced RV function ?RV clot - s/p TNK Patient completed normothermia protocol Continue aggressive electrolyte replacement Holding heparin due to hematuria, coffee ground enteric suctioning, and hemoglobin drop.   Acute respiratory failure with hypoxia Aspiration pneumonia Full vent support VAP  prevention bundle in place PAD protocol with Precedex with RASS goal -1 Continue IV Unasyn D4/7 SBT as tolerated  Demand cardiac ischemia - Due to CPR, acute illness and V-fib arrest Echocardiogram showed no wall motion abnormalities -  Continue telemetry monitoring  Seizure-like activity: EEG negative for seizures - Continue Keppra for now  Acute kidney injury due to ischemic ATN:  - Looks like she failed a lasix challenge 12/17 - Will ask nephrology to see - Avoid nephrotoxic agents  Anemia: hemoglobin from 14 down to 8 in a 4 day period.  ?GIB v tongue bite during seizure  - Increase PPI to BID - Monitor H&H - Hemoccult  - hold heparin - Consider GI input if ongoing   Hypokalemia/hypophosphatemia/hypomagnesemia/hypernatremia Continue aggressive electrolyte replacement and closely monitor  Diabetic ketoacidosis Patient presented with blood sugar over 1000 Gap closed but ongoing hyperglycemia with sugars >300 - significant insulin needs at baseline  Resume insulin gtt  DM coordinator following, await recs.   Lactic acidosis - improving   Acute metabolic/anoxic encephalopathy > improving ?anoxic injury MRI neg  PAD protocol - precedex  Daily WUA  Wean sedation as able   Best Practice (right click and "Reselect all SmartList Selections" daily)   Diet/type: NPO DVT prophylaxis: Systemic heparin GI prophylaxis: Protonix Lines: N/A Foley:  Yes, and it is still needed Code Status:  full code Last date of multidisciplinary goals of care discussion [10/19: Patient's daughter was updated at bedside, would like to continue full scope of care  Labs   CBC: Recent Labs  Lab 04/09/22 1100 04/09/22 1158 04/10/22 0642 04/10/22 1035 04/10/22 2359 04/12/22 0503 04/13/22 0419  WBC 21.3*  --  18.0*  --  19.3* 14.9* 12.7*  NEUTROABS 17.9*  --   --   --   --   --   --   HGB 12.1   < > 13.4 13.9 11.6* 9.3* 8.4*  HCT 44.5   < > 42.6 41.0 35.9* 28.8* 26.8*  MCV 86.7  --  74.5*  --  73.6* 72.2* 74.4*  PLT 232  --  204  --  153 115* 107*   < > = values in this interval not displayed.     Basic Metabolic Panel: Recent Labs  Lab 04/09/22 1330 04/09/22 1450 04/10/22 0244 04/10/22 0642 04/10/22 1035  04/10/22 2359 04/12/22 0503 04/13/22 0419  NA 142   < > 148* 150* 151* 148* 145 145  K 3.3*   < > 3.5 2.5* 2.8* 3.3* 4.1 3.1*  CL 102   < > 110 111  --  110 110 110  CO2 15*   < > 27 27  --  '26 24 23  '$ GLUCOSE 1,194*   < > 466* 414*  --  165* 322* 197*  BUN 22   < > 20 22  --  24* 37* 47*  CREATININE 2.06*   < > 1.79* 1.85*  --  2.90* 4.41* 5.57*  CALCIUM 9.1   < > 9.9 9.8  --  8.6* 7.7* 7.6*  MG 2.5*  --   --  2.2  --  1.8 1.9 2.0  PHOS  --   --   --  <1.0*  --  3.5 6.4* 4.3   < > = values in this interval not displayed.    GFR: Estimated Creatinine Clearance: 9.3 mL/min (A) (by C-G formula based on SCr of 5.57 mg/dL (H)). Recent Labs  Lab 04/09/22 1100 04/09/22 1833 04/10/22 5681 04/10/22 1246 04/10/22 2359  04/12/22 0503 04/13/22 0419  WBC 21.3*  --  18.0*  --  19.3* 14.9* 12.7*  LATICACIDVEN >9.0* 7.4*  --  4.9* 2.8*  --   --      Liver Function Tests: Recent Labs  Lab 04/09/22 1330  AST 51*  ALT 39  ALKPHOS 136*  BILITOT 0.8  PROT 6.1*  ALBUMIN 3.3*    No results for input(s): "LIPASE", "AMYLASE" in the last 168 hours. No results for input(s): "AMMONIA" in the last 168 hours.  ABG    Component Value Date/Time   PHART 7.459 (H) 04/10/2022 1035   PCO2ART 35.1 04/10/2022 1035   PO2ART 68 (L) 04/10/2022 1035   HCO3 24.9 04/10/2022 1035   TCO2 26 04/10/2022 1035   ACIDBASEDEF 4.0 (H) 04/09/2022 1450   O2SAT 94 04/10/2022 1035     Coagulation Profile: Recent Labs  Lab 04/09/22 1100  INR 1.2     Cardiac Enzymes: Recent Labs  Lab 04/09/22 1330  CKTOTAL 190     HbA1C: Hgb A1c MFr Bld  Date/Time Value Ref Range Status  07/22/2021 04:05 PM 13.5 (H) 4.6 - 6.5 % Final    Comment:    Glycemic Control Guidelines for People with Diabetes:Non Diabetic:  <6%Goal of Therapy: <7%Additional Action Suggested:  >8%   03/10/2021 12:17 PM 10.9 Repeated and verified X2. (H) 4.6 - 6.5 % Final    Comment:    Glycemic Control Guidelines for People with  Diabetes:Non Diabetic:  <6%Goal of Therapy: <7%Additional Action Suggested:  >8%     CBG: Recent Labs  Lab 04/13/22 0335 04/13/22 0422 04/13/22 0519 04/13/22 0629 04/13/22 0735  GLUCAP 192* 166* 162* 190* 203*    Critical care time 49 minutes  Georgann Housekeeper, AGACNP-BC [facility]  See Amion for personal pager PCCM on call pager [phone] until 7pm. Please call Elink 7p-7a. [phone]  04/13/2022 9:02 AM

## 2022-04-13 NOTE — Progress Notes (Addendum)
ANTICOAGULATION CONSULT NOTE  Pharmacy Consult for heparin  Indication: pulmonary embolus and clot right ventricle  Allergies  Allergen Reactions   Shingrix [Zoster Vac Recomb Adjuvanted]     Allergy to original live zoster - rash    Patient Measurements: Height: '5\' 3"'$  (160 cm) Weight: 78.5 kg (173 lb 1 oz) IBW/kg (Calculated) : 52.4 Heparin Dosing Weight: 68kg  Vital Signs: Temp: 97.5 F (36.4 C) (12/19 0737) Temp Source: Bladder (12/19 0737) BP: 126/69 (12/19 0645) Pulse Rate: 91 (12/19 0645)  Labs: Recent Labs    04/10/22 1000 04/10/22 1035 04/10/22 1200 04/10/22 2014 04/10/22 2359 04/11/22 0331 04/12/22 0503 04/12/22 1344 04/12/22 2133 04/13/22 0419  HGB  --    < >  --   --  11.6*  --  9.3*  --   --  8.4*  HCT  --    < >  --   --  35.9*  --  28.8*  --   --  26.8*  PLT  --   --   --   --  153  --  115*  --   --  107*  HEPARINUNFRC  --   --   --    < >  --    < > 0.15* 0.19* 0.37 0.29*  CREATININE  --   --   --   --  2.90*  --  4.41*  --   --  5.57*  TROPONINIHS 1,355*  --  1,133*  --   --   --   --   --   --   --    < > = values in this interval not displayed.     Estimated Creatinine Clearance: 9.3 mL/min (A) (by C-G formula based on SCr of 5.57 mg/dL (H)).  Assessment: Pt was admitted for being unresponsive with CPR administered. Pt went into cardiac arrest and found to have thrombus in right ventricle on ECHO. TNK was administered. Heparin gtt ordered for anticoagulation post TNK.   Heparin level slightly subtherapeutic (0.29 units/mL) on infusion at 750 units/hr. Previous heparin level on same rate 0.37 units/mL. No bleeding noted; however, Hgb continues to downtrend.  Goal of Therapy:  Heparin level 0.3-0.7 units/ml Monitor platelets by anticoagulation protocol: Yes   Plan:  Slightly increase heparin to 800 units/hr Daily heparin level and CBC Watch platelets - have trended down from 232 > 107  Addendum: Heparin held starting around 1100 due to  concern for blood in urine. Will monitor and restart heparin as indicated  Erskine Speed, PharmD Clinical Pharmacist 04/13/2022 7:38 AM

## 2022-04-13 NOTE — Consult Note (Signed)
[facility]  HISTORY AND PHYSICAL  Deborah Neal is an 71 y.o. female.    Chief Complaint: cardiac arrest  HPI: Pt is a 68F with a PMH sig for DM II, HTN, HLD, obesity, asthma who is now seen in consultation at the request of Dr. Verlee Monte for eval and recs re: AKI.    Pt was admitted to [facility] after family found her unresponsive on 12/15.  She had red emesis around her and en route to hospital suffered seizure in EMS.  Noted to be in DKA on arrival as well.  TTE showed clot in RV.  Had cardiac arrest x 2 at least.  Got TNK.  Is vented, on and off levophed.  K was very low and had to be aggressively repleted.  She is waking up, trying to speak and move.  Cr has gone from 2.06 12/15-->5.57 today with some oliguria, 150 mL UOP day before yesterday and 470 yesterday.  Last known baseline was 0.99 06/2021.  In this setting we are asked to see.    Daughter is at bedside.  Her other daughter is on dialysis here in [place].  BP is reasonable now, 112/ 83.  She already has urinated about 200 mL this AM since shift change.    PMH: Past Medical History:  Diagnosis Date   ALLERGIC RHINITIS 01/29/2007   Arthritis    ASTHMA 01/29/2007   ASTHMA, WITH ACUTE EXACERBATION 06/24/2008   BACK PAIN 01/29/2009   CHEST PAIN 06/24/2008   Family history of adverse reaction to anesthesia    Patients daughter has N/V after anesthesia   GERD (gastroesophageal reflux disease)    History of shingles    HYPERLIPIDEMIA 01/29/2007   HYPERTENSION 01/29/2007   OSTEOPENIA 10/16/2007   Type II or unspecified type diabetes mellitus without mention of complication, uncontrolled 11/29/2013   PSH: Past Surgical History:  Procedure Laterality Date   CHOLECYSTECTOMY N/A 09/19/2015   Procedure: LAPAROSCOPIC CHOLECYSTECTOMY WITH INTRAOPERATIVE CHOLANGIOGRAM;  Surgeon: Jackolyn Confer, MD;  Location: [facility];  Service: General;  Laterality: N/A;   COLONOSCOPY     TONSILLECTOMY  1956   VIDEO BRONCHOSCOPY Bilateral 03/28/2014    Procedure: VIDEO BRONCHOSCOPY WITHOUT FLUORO;  Surgeon: Tanda Rockers, MD;  Location: [facility];  Service: Cardiopulmonary;  Laterality: Bilateral;    Past Medical History:  Diagnosis Date   ALLERGIC RHINITIS 01/29/2007   Arthritis    ASTHMA 01/29/2007   ASTHMA, WITH ACUTE EXACERBATION 06/24/2008   BACK PAIN 01/29/2009   CHEST PAIN 06/24/2008   Family history of adverse reaction to anesthesia    Patients daughter has N/V after anesthesia   GERD (gastroesophageal reflux disease)    History of shingles    HYPERLIPIDEMIA 01/29/2007   HYPERTENSION 01/29/2007   OSTEOPENIA 10/16/2007   Type II or unspecified type diabetes mellitus without mention of complication, uncontrolled 11/29/2013    Medications:  Scheduled:  Chlorhexidine Gluconate Cloth  6 each Topical Daily   clotrimazole  1 Applicatorful Vaginal QHS   docusate  100 mg Per Tube BID   feeding supplement (PROSource TF20)  60 mL Per Tube Daily   furosemide  80 mg Intravenous Once   ipratropium-albuterol  3 mL Nebulization Q6H   mouth rinse  15 mL Mouth Rinse Q2H   pantoprazole (PROTONIX) IV  40 mg Intravenous Q12H   polyethylene glycol  17 g Per Tube Daily   potassium chloride  40 mEq Per Tube Once    Medications Prior to Admission  Medication Sig  Dispense Refill   albuterol (PROAIR HFA) 108 (90 Base) MCG/ACT inhaler INHALE 2 PUFFS BY MOUTH EVERY 6 HOURS AS NEEDED FOR WHEEZING OR SHORTNESS OF BREATH (Patient taking differently: Inhale 2 puffs into the lungs every 6 (six) hours as needed for wheezing or shortness of breath.) 8.5 g 5   aspirin 81 MG tablet Take 81 mg by mouth every morning.     empagliflozin (JARDIANCE) 25 MG TABS tablet Take 1 tablet (25 mg total) by mouth daily. 90 tablet 3   insulin glargine, 2 Unit Dial, (TOUJEO MAX SOLOSTAR) 300 UNIT/ML Solostar Pen ADMINISTER 100 UNITS UNDER THE SKIN DAILY BEFORE AND SUPPER 6 mL 5   insulin regular human CONCENTRATED (HUMULIN R U-500 KWIKPEN) 500 UNIT/ML KwikPen ADMINISTER 60  UNITS UNDER THE SKIN 30 MINUTES BEFORE MEALS (Patient taking differently: Inject 60 Units into the skin 3 (three) times daily with meals.) 6 mL 3   losartan (COZAAR) 100 MG tablet Take 1 tablet (100 mg total) by mouth daily. 90 tablet 3   Multiple Vitamin (MULTIVITAMIN WITH MINERALS) TABS tablet Take 1 tablet by mouth daily.     potassium chloride (KLOR-CON) 10 MEQ tablet 3 tab by mouth once daily 270 tablet 3   rosuvastatin (CRESTOR) 20 MG tablet 1 tab by mouth once daily 90 tablet 3   Blood Glucose Monitoring Suppl (ONE TOUCH ULTRA 2) w/Device KIT Use as directed 1 each 0   Lancets MISC Use as directed four times daily  E11.9 400 each 11   ONETOUCH VERIO test strip USE AS DIRECTED THREE TIMES DAILY 100 strip 4   ONETOUCH VERIO test strip USE TO CHECK BLOOD SUGARS THREE TIMES DAILY 300 strip 3   tiZANidine (ZANAFLEX) 2 MG tablet TAKE 1 TABLET(2 MG) BY MOUTH EVERY 6 HOURS AS NEEDED FOR MUSCLE SPASMS (Patient taking differently: Take 2 mg by mouth every 6 (six) hours as needed for muscle spasms.) 30 tablet 1    ALLERGIES:   Allergies  Allergen Reactions   Shingrix [Zoster Vac Recomb Adjuvanted]     Allergy to original live zoster - rash    FAM HX: Family History  Problem Relation Age of Onset   Cancer Mother        ? type    Diabetes Father    Hypertension Father    Heart disease Father    Asthma Father    Asthma Daughter    Colon cancer Neg Hx    Esophageal cancer Neg Hx    Rectal cancer Neg Hx    Stomach cancer Neg Hx    Breast cancer Neg Hx     Social History:   reports that she quit smoking about 18 years ago. Her smoking use included cigarettes. She has never used smokeless tobacco. She reports that she does not drink alcohol and does not use drugs.  ROS: ROS: not able to be obtained d/t intubation  Blood pressure 99/67, pulse 95, temperature (!) 97.5 F (36.4 C), temperature source Bladder, resp. rate (!) 29, height _0  (1.6 m), weight 78.5 kg, SpO2 96 %. PHYSICAL  EXAM: Physical Exam GEN ill-appearing, intubated and sedated HEENT eyes closed, maybe a little puffy NECK no over JVD PULM coarse mechanical bilaterally CV RRR ABD soft EXT 1+ LE edema NEURO intubated, sedated, + mitts   Results for orders placed or performed during the hospital encounter of 04/09/22 (from the past 48 hour(s))  Glucose, capillary     Status: Abnormal   Collection Time: 04/11/22 12:24 PM  Result Value Ref Range   Glucose-Capillary 104 (H) 70 - 99 mg/dL    Comment: Glucose reference range applies only to samples taken after fasting for at least 8 hours.  Glucose, capillary     Status: Abnormal   Collection Time: 04/11/22  4:07 PM  Result Value Ref Range   Glucose-Capillary 208 (H) 70 - 99 mg/dL    Comment: Glucose reference range applies only to samples taken after fasting for at least 8 hours.  Glucose, capillary     Status: Abnormal   Collection Time: 04/11/22  7:39 PM  Result Value Ref Range   Glucose-Capillary 228 (H) 70 - 99 mg/dL    Comment: Glucose reference range applies only to samples taken after fasting for at least 8 hours.  Glucose, capillary     Status: Abnormal   Collection Time: 04/11/22 11:49 PM  Result Value Ref Range   Glucose-Capillary 264 (H) 70 - 99 mg/dL    Comment: Glucose reference range applies only to samples taken after fasting for at least 8 hours.  Glucose, capillary     Status: Abnormal   Collection Time: 04/11/22 11:54 PM  Result Value Ref Range   Glucose-Capillary 261 (H) 70 - 99 mg/dL    Comment: Glucose reference range applies only to samples taken after fasting for at least 8 hours.  Glucose, capillary     Status: Abnormal   Collection Time: 04/12/22  4:59 AM  Result Value Ref Range   Glucose-Capillary 319 (H) 70 - 99 mg/dL    Comment: Glucose reference range applies only to samples taken after fasting for at least 8 hours.  CBC     Status: Abnormal   Collection Time: 04/12/22  5:03 AM  Result Value Ref Range   WBC 14.9  (H) 4.0 - 10.5 K/uL   RBC 3.99 3.87 - 5.11 MIL/uL   Hemoglobin 9.3 (L) 12.0 - 15.0 g/dL   HCT 28.8 (L) 36.0 - 46.0 %   MCV 72.2 (L) 80.0 - 100.0 fL   MCH 23.3 (L) 26.0 - 34.0 pg   MCHC 32.3 30.0 - 36.0 g/dL   RDW 15.8 (H) 11.5 - 15.5 %   Platelets 115 (L) 150 - 400 K/uL   nRBC 0.2 0.0 - 0.2 %    Comment: Performed at [facility] [address], [place], [place] [address]  Basic metabolic panel     Status: Abnormal   Collection Time: 04/12/22  5:03 AM  Result Value Ref Range   Sodium 145 135 - 145 mmol/L   Potassium 4.1 3.5 - 5.1 mmol/L    Comment: DELTA CHECK NOTED   Chloride 110 98 - 111 mmol/L   CO2 24 22 - 32 mmol/L   Glucose, Bld 322 (H) 70 - 99 mg/dL    Comment: Glucose reference range applies only to samples taken after fasting for at least 8 hours.   BUN 37 (H) 8 - 23 mg/dL   Creatinine, Ser 4.41 (H) 0.44 - 1.00 mg/dL    Comment: DELTA CHECK NOTED   Calcium 7.7 (L) 8.9 - 10.3 mg/dL   GFR, Estimated 10 (L) >60 mL/min    Comment: (NOTE) Calculated using the CKD-EPI Creatinine Equation (2021)    Anion gap 11 5 - 15    Comment: Performed at [facility] [address] [place], [place] [address]  Magnesium     Status: None   Collection Time: 04/12/22  5:03 AM  Result Value Ref Range  Magnesium 1.9 1.7 - 2.4 mg/dL    Comment: Performed at [facility], [place] [address], [place], [place] [address]  Phosphorus     Status: Abnormal   Collection Time: 04/12/22  5:03 AM  Result Value Ref Range   Phosphorus 6.4 (H) 2.5 - 4.6 mg/dL    Comment: Performed at [facility] [address] [place], [place] [address]  Heparin level (unfractionated)     Status: Abnormal   Collection Time: 04/12/22  5:03 AM  Result Value Ref Range   Heparin Unfractionated 0.15 (L) 0.30 - 0.70 IU/mL    Comment: (NOTE) The clinical reportable range upper limit is being lowered to >1.10 to align with the FDA approved guidance for the current laboratory assay.  If heparin  results are below expected values, and patient dosage has  been confirmed, suggest follow up testing of antithrombin III levels. Performed at [facility], [place] [address] [place], [place] [address]   Glucose, capillary     Status: Abnormal   Collection Time: 04/12/22 10:20 AM  Result Value Ref Range   Glucose-Capillary 281 (H) 70 - 99 mg/dL    Comment: Glucose reference range applies only to samples taken after fasting for at least 8 hours.  Glucose, capillary     Status: Abnormal   Collection Time: 04/12/22 11:33 AM  Result Value Ref Range   Glucose-Capillary 209 (H) 70 - 99 mg/dL    Comment: Glucose reference range applies only to samples taken after fasting for at least 8 hours.  Glucose, capillary     Status: Abnormal   Collection Time: 04/12/22 12:26 PM  Result Value Ref Range   Glucose-Capillary 173 (H) 70 - 99 mg/dL    Comment: Glucose reference range applies only to samples taken after fasting for at least 8 hours.  Glucose, capillary     Status: Abnormal   Collection Time: 04/12/22  1:38 PM  Result Value Ref Range   Glucose-Capillary 155 (H) 70 - 99 mg/dL    Comment: Glucose reference range applies only to samples taken after fasting for at least 8 hours.  Heparin level (unfractionated)     Status: Abnormal   Collection Time: 04/12/22  1:44 PM  Result Value Ref Range   Heparin Unfractionated 0.19 (L) 0.30 - 0.70 IU/mL    Comment: (NOTE) The clinical reportable range upper limit is being lowered to >1.10 to align with the FDA approved guidance for the current laboratory assay.  If heparin results are below expected values, and patient dosage has  been confirmed, suggest follow up testing of antithrombin III levels. Performed at [facility], [place] [address], [place], [place] [address]   Glucose, capillary     Status: Abnormal   Collection Time: 04/12/22  2:25 PM  Result Value Ref Range   Glucose-Capillary 165 (H) 70 - 99 mg/dL    Comment: Glucose  reference range applies only to samples taken after fasting for at least 8 hours.  Glucose, capillary     Status: Abnormal   Collection Time: 04/12/22  3:32 PM  Result Value Ref Range   Glucose-Capillary 204 (H) 70 - 99 mg/dL    Comment: Glucose reference range applies only to samples taken after fasting for at least 8 hours.  Glucose, capillary     Status: Abnormal   Collection Time: 04/12/22  4:25 PM  Result Value Ref Range   Glucose-Capillary 203 (H) 70 - 99 mg/dL    Comment:  Glucose reference range applies only to samples taken after fasting for at least 8 hours.  Glucose, capillary     Status: Abnormal   Collection Time: 04/12/22  5:32 PM  Result Value Ref Range   Glucose-Capillary 167 (H) 70 - 99 mg/dL    Comment: Glucose reference range applies only to samples taken after fasting for at least 8 hours.  Glucose, capillary     Status: Abnormal   Collection Time: 04/12/22  6:39 PM  Result Value Ref Range   Glucose-Capillary 182 (H) 70 - 99 mg/dL    Comment: Glucose reference range applies only to samples taken after fasting for at least 8 hours.  Glucose, capillary     Status: Abnormal   Collection Time: 04/12/22  7:54 PM  Result Value Ref Range   Glucose-Capillary 127 (H) 70 - 99 mg/dL    Comment: Glucose reference range applies only to samples taken after fasting for at least 8 hours.  Glucose, capillary     Status: Abnormal   Collection Time: 04/12/22  8:53 PM  Result Value Ref Range   Glucose-Capillary 179 (H) 70 - 99 mg/dL    Comment: Glucose reference range applies only to samples taken after fasting for at least 8 hours.  Heparin level (unfractionated)     Status: None   Collection Time: 04/12/22  9:33 PM  Result Value Ref Range   Heparin Unfractionated 0.37 0.30 - 0.70 IU/mL    Comment: (NOTE) The clinical reportable range upper limit is being lowered to >1.10 to align with the FDA approved guidance for the current laboratory assay.  If heparin results are below  expected values, and patient dosage has  been confirmed, suggest follow up testing of antithrombin III levels. Performed at [facility], [place] [address] [place], [place] [address]   Glucose, capillary     Status: Abnormal   Collection Time: 04/12/22 10:14 PM  Result Value Ref Range   Glucose-Capillary 200 (H) 70 - 99 mg/dL    Comment: Glucose reference range applies only to samples taken after fasting for at least 8 hours.  Glucose, capillary     Status: Abnormal   Collection Time: 04/12/22 11:10 PM  Result Value Ref Range   Glucose-Capillary 186 (H) 70 - 99 mg/dL    Comment: Glucose reference range applies only to samples taken after fasting for at least 8 hours.  Glucose, capillary     Status: Abnormal   Collection Time: 04/13/22 12:21 AM  Result Value Ref Range   Glucose-Capillary 162 (H) 70 - 99 mg/dL    Comment: Glucose reference range applies only to samples taken after fasting for at least 8 hours.  Glucose, capillary     Status: Abnormal   Collection Time: 04/13/22  1:28 AM  Result Value Ref Range   Glucose-Capillary 132 (H) 70 - 99 mg/dL    Comment: Glucose reference range applies only to samples taken after fasting for at least 8 hours.  Glucose, capillary     Status: Abnormal   Collection Time: 04/13/22  2:26 AM  Result Value Ref Range   Glucose-Capillary 180 (H) 70 - 99 mg/dL    Comment: Glucose reference range applies only to samples taken after fasting for at least 8 hours.  Glucose, capillary     Status: Abnormal   Collection Time: 04/13/22  3:35 AM  Result Value Ref Range   Glucose-Capillary 192 (H) 70 - 99 mg/dL    Comment: Glucose reference range applies only  to samples taken after fasting for at least 8 hours.  Triglycerides     Status: None   Collection Time: 04/13/22  4:19 AM  Result Value Ref Range   Triglycerides 79 <150 mg/dL    Comment: Performed at [facility] [address], [place], [place] [address]  CBC     Status: Abnormal    Collection Time: 04/13/22  4:19 AM  Result Value Ref Range   WBC 12.7 (H) 4.0 - 10.5 K/uL   RBC 3.60 (L) 3.87 - 5.11 MIL/uL   Hemoglobin 8.4 (L) 12.0 - 15.0 g/dL    Comment: Reticulocyte Hemoglobin testing may be clinically indicated, consider ordering this additional test [id number]    HCT 26.8 (L) 36.0 - 46.0 %   MCV 74.4 (L) 80.0 - 100.0 fL   MCH 23.3 (L) 26.0 - 34.0 pg   MCHC 31.3 30.0 - 36.0 g/dL   RDW 15.3 11.5 - 15.5 %   Platelets 107 (L) 150 - 400 K/uL    Comment: REPEATED TO VERIFY   nRBC 0.2 0.0 - 0.2 %    Comment: Performed at [facility], [place] [address] [place], [place] [address]  Basic metabolic panel     Status: Abnormal   Collection Time: 04/13/22  4:19 AM  Result Value Ref Range   Sodium 145 135 - 145 mmol/L   Potassium 3.1 (L) 3.5 - 5.1 mmol/L   Chloride 110 98 - 111 mmol/L   CO2 23 22 - 32 mmol/L   Glucose, Bld 197 (H) 70 - 99 mg/dL    Comment: Glucose reference range applies only to samples taken after fasting for at least 8 hours.   BUN 47 (H) 8 - 23 mg/dL   Creatinine, Ser 5.57 (H) 0.44 - 1.00 mg/dL   Calcium 7.6 (L) 8.9 - 10.3 mg/dL   GFR, Estimated 8 (L) >60 mL/min    Comment: (NOTE) Calculated using the CKD-EPI Creatinine Equation (2021)    Anion gap 12 5 - 15    Comment: Performed at [facility] [address], [place], [place] [address]  Magnesium     Status: None   Collection Time: 04/13/22  4:19 AM  Result Value Ref Range   Magnesium 2.0 1.7 - 2.4 mg/dL    Comment: Performed at [facility] [address], [place], [place] [address]  Phosphorus     Status: None   Collection Time: 04/13/22  4:19 AM  Result Value Ref Range   Phosphorus 4.3 2.5 - 4.6 mg/dL    Comment: Performed at [facility] [address] [place], [place] [address]  Heparin level (unfractionated)     Status: Abnormal   Collection Time: 04/13/22  4:19 AM  Result Value Ref Range   Heparin Unfractionated 0.29 (L) 0.30 - 0.70 IU/mL    Comment:  (NOTE) The clinical reportable range upper limit is being lowered to >1.10 to align with the FDA approved guidance for the current laboratory assay.  If heparin results are below expected values, and patient dosage has  been confirmed, suggest follow up testing of antithrombin III levels. Performed at [facility], [place] [address], [place], [place] [address]   Glucose, capillary     Status: Abnormal   Collection Time: 04/13/22  4:22 AM  Result Value Ref Range   Glucose-Capillary 166 (H) 70 - 99 mg/dL    Comment: Glucose reference range applies only to samples taken after fasting  for at least 8 hours.  Glucose, capillary     Status: Abnormal   Collection Time: 04/13/22  5:19 AM  Result Value Ref Range   Glucose-Capillary 162 (H) 70 - 99 mg/dL    Comment: Glucose reference range applies only to samples taken after fasting for at least 8 hours.  Glucose, capillary     Status: Abnormal   Collection Time: 04/13/22  6:29 AM  Result Value Ref Range   Glucose-Capillary 190 (H) 70 - 99 mg/dL    Comment: Glucose reference range applies only to samples taken after fasting for at least 8 hours.  Glucose, capillary     Status: Abnormal   Collection Time: 04/13/22  7:35 AM  Result Value Ref Range   Glucose-Capillary 203 (H) 70 - 99 mg/dL    Comment: Glucose reference range applies only to samples taken after fasting for at least 8 hours.  Glucose, capillary     Status: Abnormal   Collection Time: 04/13/22  8:41 AM  Result Value Ref Range   Glucose-Capillary 207 (H) 70 - 99 mg/dL    Comment: Glucose reference range applies only to samples taken after fasting for at least 8 hours.  Glucose, capillary     Status: Abnormal   Collection Time: 04/13/22  9:54 AM  Result Value Ref Range   Glucose-Capillary 230 (H) 70 - 99 mg/dL    Comment: Glucose reference range applies only to samples taken after fasting for at least 8 hours.  Glucose, capillary     Status: Abnormal   Collection Time:  04/13/22 10:59 AM  Result Value Ref Range   Glucose-Capillary 210 (H) 70 - 99 mg/dL    Comment: Glucose reference range applies only to samples taken after fasting for at least 8 hours.    MR BRAIN WO CONTRAST  Result Date: 04/11/2022 CLINICAL DATA:  Neuro deficit, acute, stroke suspected. Mental status change of unknown cause. EXAM: MRI HEAD WITHOUT CONTRAST TECHNIQUE: Multiplanar, multiecho pulse sequences of the brain and surrounding structures were obtained without intravenous contrast. COMPARISON:  CT head without contrast 04/09/2022 FINDINGS: Brain: No acute infarct, hemorrhage, or mass lesion is present. Moderate atrophy and white matter changes are stable. Deep brain nuclei are within normal limits. The ventricles are proportionate to the degree of atrophy. No significant extraaxial fluid collection is present. The internal auditory canals are within normal limits. The brainstem and cerebellum are within normal limits. Vascular: Flow is present in the major intracranial arteries. Skull and upper cervical spine: The craniocervical junction is normal. Upper cervical spine is within normal limits. Marrow signal is unremarkable. Sinuses/Orbits: Bilateral exophthalmos is stable. Globes and orbits are otherwise within normal limits. Left greater than right mastoid effusions are present. Fluid is present the nasopharynx secondary to intubation. Mild mucosal thickening is present in ethmoid air cells and sphenoid sinuses. IMPRESSION: 1. No acute intracranial abnormality or significant interval change. 2. Stable moderate atrophy and white matter disease. This likely reflects the sequela of chronic microvascular ischemia. Electronically Signed   By: San Morelle M.D.   On: 04/11/2022 18:26    Assessment/Plan  AKI on presumed CKD- has uncontrolled DM and HTN at baseline so think that probably has some baseline CKD.  Has some oliguria but appears to be picking up from the past few days so hopefully  we are seeing some beginning signs of recovery?  - will replete K today  - IV Lasix 80 x 1  - discussed with dtr at bedside- hoping to  get through this without dialysis but if rate of rise of Cr doesn't plateau or we see low UOP, will have to do so- in agreement  - will recheck UA and UP/C along with renal US  2.  Massive PE  - RV clot, got TNK and now on hep gtt  3.  Cardiac arrest: secondary to #2, MRI 12/17 didn't look too bad, now waking up  4.  DM II: on insulin gtt, per primary  5.  Hypokalemia: repleting  6.  Dispo: ICU  Madelon Lips 04/13/2022, 11:16 AM

## 2022-04-13 NOTE — Progress Notes (Signed)
eLink Physician-Brief Progress Note Patient Name: Deborah Neal DOB: 1950-08-19 MRN: [id number]   Date of Service  04/13/2022  HPI/Events of Note  Nursing reporting vaginal yeast infection.   eICU Interventions  Plan: Clotrimazole vaginal cream 1 applicatorful vaginal now and Q HS X 7 doses.      Intervention Category Major Interventions: Other:  Lysle Dingwall 04/13/2022, 2:37 AM

## 2022-04-13 NOTE — Progress Notes (Signed)
Brief Nutrition Support Note  Consult received to advance tube feeding rate to goal. Discussed with PCCM NP. Trickle tube feeds of Vital 1.5 @ 20 ml/hr were started yesterday via OG tube. Discussed plan with RN.  RD to adjust tube feeding regimen: - Increase rate of Vital 1.5 to 30 ml/hr and continue to advance by 10 ml q 8 hours to goal rate of 50 ml/hr (1200 ml/day) - Add PROSource TF20 60 ml daily  Tube feeding regimen at goal rate provides 1880 kcal, 101 grams of protein, and 917 ml of H2O.   RD will continue to follow pt during admission.   Gustavus Bryant, MS, RD, LDN Inpatient Clinical Dietitian Please see AMiON for contact information.

## 2022-04-13 NOTE — Progress Notes (Signed)
Pt transported to CT and back to 3M06 without any complications.

## 2022-04-14 ENCOUNTER — Inpatient Hospital Stay (HOSPITAL_COMMUNITY): Payer: Medicare PPO

## 2022-04-14 DIAGNOSIS — I469 Cardiac arrest, cause unspecified: Secondary | ICD-10-CM | POA: Diagnosis not present

## 2022-04-14 LAB — CBC
HCT: 26.1 % — ABNORMAL LOW (ref 36.0–46.0)
Hemoglobin: 8 g/dL — ABNORMAL LOW (ref 12.0–15.0)
MCH: 23.1 pg — ABNORMAL LOW (ref 26.0–34.0)
MCHC: 30.7 g/dL (ref 30.0–36.0)
MCV: 75.4 fL — ABNORMAL LOW (ref 80.0–100.0)
Platelets: 104 10*3/uL — ABNORMAL LOW (ref 150–400)
RBC: 3.46 MIL/uL — ABNORMAL LOW (ref 3.87–5.11)
RDW: 15.7 % — ABNORMAL HIGH (ref 11.5–15.5)
WBC: 7 10*3/uL (ref 4.0–10.5)
nRBC: 0 % (ref 0.0–0.2)

## 2022-04-14 LAB — CULTURE, BLOOD (ROUTINE X 2)
Culture: NO GROWTH
Culture: NO GROWTH
Special Requests: ADEQUATE

## 2022-04-14 LAB — GLUCOSE, CAPILLARY
Glucose-Capillary: 156 mg/dL — ABNORMAL HIGH (ref 70–99)
Glucose-Capillary: 159 mg/dL — ABNORMAL HIGH (ref 70–99)
Glucose-Capillary: 161 mg/dL — ABNORMAL HIGH (ref 70–99)
Glucose-Capillary: 169 mg/dL — ABNORMAL HIGH (ref 70–99)
Glucose-Capillary: 170 mg/dL — ABNORMAL HIGH (ref 70–99)
Glucose-Capillary: 178 mg/dL — ABNORMAL HIGH (ref 70–99)
Glucose-Capillary: 180 mg/dL — ABNORMAL HIGH (ref 70–99)
Glucose-Capillary: 180 mg/dL — ABNORMAL HIGH (ref 70–99)
Glucose-Capillary: 185 mg/dL — ABNORMAL HIGH (ref 70–99)
Glucose-Capillary: 186 mg/dL — ABNORMAL HIGH (ref 70–99)
Glucose-Capillary: 186 mg/dL — ABNORMAL HIGH (ref 70–99)
Glucose-Capillary: 188 mg/dL — ABNORMAL HIGH (ref 70–99)
Glucose-Capillary: 198 mg/dL — ABNORMAL HIGH (ref 70–99)
Glucose-Capillary: 199 mg/dL — ABNORMAL HIGH (ref 70–99)
Glucose-Capillary: 206 mg/dL — ABNORMAL HIGH (ref 70–99)
Glucose-Capillary: 210 mg/dL — ABNORMAL HIGH (ref 70–99)

## 2022-04-14 LAB — BASIC METABOLIC PANEL
Anion gap: 10 (ref 5–15)
BUN: 54 mg/dL — ABNORMAL HIGH (ref 8–23)
CO2: 25 mmol/L (ref 22–32)
Calcium: 8 mg/dL — ABNORMAL LOW (ref 8.9–10.3)
Chloride: 114 mmol/L — ABNORMAL HIGH (ref 98–111)
Creatinine, Ser: 5.64 mg/dL — ABNORMAL HIGH (ref 0.44–1.00)
GFR, Estimated: 8 mL/min — ABNORMAL LOW (ref >60)
Glucose, Bld: 200 mg/dL — ABNORMAL HIGH (ref 70–99)
Potassium: 3.9 mmol/L (ref 3.5–5.1)
Sodium: 149 mmol/L — ABNORMAL HIGH (ref 135–145)

## 2022-04-14 LAB — MAGNESIUM: Magnesium: 1.9 mg/dL (ref 1.7–2.4)

## 2022-04-14 LAB — OCCULT BLOOD X 1 CARD TO LAB, STOOL: Fecal Occult Bld: NEGATIVE

## 2022-04-14 LAB — HEPARIN LEVEL (UNFRACTIONATED)
Heparin Unfractionated: <0.1 IU/mL — ABNORMAL LOW (ref 0.30–0.70)
Heparin Unfractionated: 0.14 IU/mL — ABNORMAL LOW (ref 0.30–0.70)

## 2022-04-14 LAB — PHOSPHORUS: Phosphorus: 4.8 mg/dL — ABNORMAL HIGH (ref 2.5–4.6)

## 2022-04-14 MED ORDER — INSULIN ASPART 100 UNIT/ML IJ SOLN: 0.0000 [IU] | INTRAMUSCULAR | Status: DC

## 2022-04-14 MED ORDER — METHYLPREDNISOLONE SODIUM SUCC 40 MG IJ SOLR
40.0000 mg | Freq: Three times a day (TID) | INTRAMUSCULAR | Status: DC
  Administered 2022-04-14 – 2022-04-16 (×6): 40 mg via INTRAVENOUS
  Filled 2022-04-14 (×6): qty 1

## 2022-04-14 MED ORDER — INSULIN DETEMIR 100 UNIT/ML ~~LOC~~ SOLN
0.3000 [IU]/kg | SUBCUTANEOUS | Status: DC
  Filled 2022-04-14: qty 0.24

## 2022-04-14 MED ORDER — INSULIN DETEMIR 100 UNIT/ML ~~LOC~~ SOLN
30.0000 [IU] | Freq: Two times a day (BID) | SUBCUTANEOUS | Status: DC
  Filled 2022-04-14 (×2): qty 0.3

## 2022-04-14 NOTE — Progress Notes (Signed)
No Cuff leak during assessment  Patient also gradually becoming increasingly tachycardic tachypneic.  Will continue insulin drip   Solu-Medrol 40 every 8 to help with possible airway edema  As needed bronchodilators  Will reevaluate in 24 hours for weaning/extubation  CC time 30 min

## 2022-04-14 NOTE — Progress Notes (Signed)
ANTICOAGULATION CONSULT NOTE  Pharmacy Consult for heparin  Indication: pulmonary embolus and clot right ventricle  Allergies  Allergen Reactions   Shingrix [Zoster Vac Recomb Adjuvanted]     Allergy to original live zoster - rash    Patient Measurements: Height: '5\' 3"'$  (160 cm) Weight: 78.5 kg (173 lb 1 oz) IBW/kg (Calculated) : 52.4 Heparin Dosing Weight: 68kg  Vital Signs: Temp: 99.1 F (37.3 C) (12/20 2000) Temp Source: Bladder (12/20 2000) BP: 138/74 (12/20 2000) Pulse Rate: 79 (12/20 2000)  Labs: Recent Labs    04/12/22 0503 04/12/22 1344 04/13/22 0419 04/13/22 1425 04/14/22 0421 04/14/22 1938  HGB 9.3*  --  8.4* 8.0* 8.0*  --   HCT 28.8*  --  26.8* 25.5* 26.1*  --   PLT 115*  --  107*  --  104*  --   HEPARINUNFRC 0.15*   < > 0.29*  --  <0.10* 0.14*  CREATININE 4.41*  --  5.57*  --  5.64*  --    < > = values in this interval not displayed.     Estimated Creatinine Clearance: 9.2 mL/min (A) (by C-G formula based on SCr of 5.64 mg/dL (H)).  Assessment: Pt was admitted for being unresponsive with CPR administered. Pt went into cardiac arrest and found to have thrombus in right ventricle on ECHO. TNK was administered. Heparin gtt ordered for anticoagulation post TNK.   Heparin was held 12/19 as patient was experiencing blood in her urine. CT negative for retroperitoneal bleed. No other signs of bleeding. Discussed with CCM - okay to restart heparin today.  Heparin level 0.14 (subtherapeutic).  Goal of Therapy:  Heparin level 0.3-0.7 units/ml Monitor platelets by anticoagulation protocol: Yes   Plan:  Increase heparin to 950 units/hr Check heparin level 0530 Daily heparin level and CBC Watch platelets - have trended down from 232 > Eldred, PharmD, [facility] Clinical Pharmacist Please see AMION for all Pharmacists' Contact Phone Numbers 04/14/2022, 9:02 PM

## 2022-04-14 NOTE — Progress Notes (Signed)
Attempted to Extubated pt but pt did not have a cuff leak. MD made aware. RN at bedside. Will continue to monitor.

## 2022-04-14 NOTE — Progress Notes (Signed)
NAME:  Deborah Neal, MRN:  [id number], DOB:  1950/12/30, LOS: 5 ADMISSION DATE:  04/09/2022, CONSULTATION DATE: 12/15 REFERRING MD: Dr. Vanita Panda EDP, CHIEF COMPLAINT: Cardiac arrest  History of Present Illness:  71 year old female with past medical history as below, which is significant for hypertension, hyperlipidemia, type 2 diabetes mellitus, and asthma.  She was in her usual state of health until approximately 5:30 AM on 12/15 when she was seen by granddaughter.  Family attempted to contact her via phone around 7 AM and she did not answer.  When they later went to check on her around 10 AM she was unresponsive in her bed with red emesis seen around her.  Upon arrival to the fire department the patient was unresponsive but did have pulse and respirations. Then suffered seizure.  Upon EMS arrival patient lost pulses and CPR was initiated.   CPR less than 5 minutes.  Patient demonstrated seizure-like activity en route to [facility] emergency department.  Improved with benzodiazepine administration.  Upon arrival to emergency department the patient was intubated.  She remained unresponsive at the time PCCM was asked to admit.  Pertinent  Medical History   has a past medical history of ALLERGIC RHINITIS (01/29/2007), Arthritis, ASTHMA (01/29/2007), ASTHMA, WITH ACUTE EXACERBATION (06/24/2008), BACK PAIN (01/29/2009), CHEST PAIN (06/24/2008), Family history of adverse reaction to anesthesia, GERD (gastroesophageal reflux disease), History of shingles, HYPERLIPIDEMIA (01/29/2007), HYPERTENSION (01/29/2007), OSTEOPENIA (10/16/2007), and Type II or unspecified type diabetes mellitus without mention of complication, uncontrolled (11/29/2013).   Significant Hospital Events: Including procedures, antibiotic start and stop dates in addition to other pertinent events   12/15 admit to [facility] s/p cardiac arrest 12/17 MRI - neg acute, stable moderate atrophy and white matter disease. This likely reflects the sequela of  chronic microvascular ischemia. 12/20-weaning well  Interim History / Subjective:  Weaning well Good urine output Creatinine still bumped some   Objective   Blood pressure 110/64, pulse 85, temperature (!) 97.5 F (36.4 C), temperature source Bladder, resp. rate (!) 24, height '5\' 3"'$  (1.6 m), weight 78.5 kg, SpO2 98 %.    Vent Mode: CPAP;PSV FiO2 (%):  [40 %] 40 % Set Rate:  [24 bmp] 24 bmp Vt Set:  [420 mL] 420 mL PEEP:  [5 cmH20] 5 cmH20 Pressure Support:  [10 cmH20] 10 cmH20 Plateau Pressure:  [22 cmH20] 22 cmH20   Intake/Output Summary (Last 24 hours) at 04/14/2022 0940 Last data filed at 04/14/2022 0900 Gross per 24 hour  Intake 3683.29 ml  Output 3835 ml  Net -151.71 ml   Filed Weights   04/11/22 0500 04/12/22 0304 04/13/22 0251  Weight: 76.1 kg 74.7 kg 78.5 kg    Examination: General: Elderly, awake alert interactive HEENT: [place]/AT, PERRL Neuro: RASS -1 following simple commands.  Chest:  Clear bilateral breath sounds. PSV 10/5 with increased RR.  Heart: RRR, no MRG Abdomen: Soft, NT, ND Skin: Grossly intact  Resolved Hospital Problem list     Assessment & Plan:   Status post out-of-hospital PEA cardiac arrest In hospital for V-fib/V. tach cardiac arrest in the setting of severe hypokalemia Possible massive PE -Did receive TNKase -Completed normothermia protocol -Continue electrolyte repletion  Acute respiratory failure with hypoxia Aspiration pneumonia -Continue full vent support -Appears to be weaning well at present -Completed Unasyn -Tolerating breathing trials -Will attempt extubation today  Demand ischemia -Post CPR -No wall motion abnormalities on echo  Seizure-like activity EEG negative for seizures -On Keppra at present  Acute kidney injury due  to ischemic ATN -Appreciate renal follow-up -Not oliguric -BUN/creatinine slightly higher  Anemia -Concern for GI bleed/retroperitoneal bleed -CT scan negative for retroperitoneal  bleed -continue to monitor  Diabetic ketoacidosis -Was on insulin gtt. -Attempting to transition off insulin gtt. -Start insulin 30 units twice daily-Levemir  Acute metabolic encephalopathy Concern for anoxic injury -She is appropriate in interaction -Wake up assessment seems to be going well -Will attempt to wean and extubate   Biggest risk remains kidney functions and stabilization of kidney functions Continue to aggressively control sugars Completed antibiotic therapy for aspiration pneumonia  Best Practice (right click and "Reselect all SmartList Selections" daily)   Diet/type: NPO DVT prophylaxis: Systemic heparin GI prophylaxis: Protonix Lines: N/A Foley:  Yes, and it is still needed Code Status:  full code Last date of multidisciplinary goals of care discussion [10/19: Patient's daughter was updated at bedside, would like to continue full scope of care  Labs   CBC: Recent Labs  Lab 04/09/22 1100 04/09/22 1158 04/10/22 0642 04/10/22 1035 04/10/22 2359 04/12/22 0503 04/13/22 0419 04/13/22 1425 04/14/22 0421  WBC 21.3*  --  18.0*  --  19.3* 14.9* 12.7*  --  7.0  NEUTROABS 17.9*  --   --   --   --   --   --   --   --   HGB 12.1   < > 13.4   < > 11.6* 9.3* 8.4* 8.0* 8.0*  HCT 44.5   < > 42.6   < > 35.9* 28.8* 26.8* 25.5* 26.1*  MCV 86.7  --  74.5*  --  73.6* 72.2* 74.4*  --  75.4*  PLT 232  --  204  --  153 115* 107*  --  104*   < > = values in this interval not displayed.    Basic Metabolic Panel: Recent Labs  Lab 04/10/22 0642 04/10/22 1035 04/10/22 2359 04/12/22 0503 04/13/22 0419 04/14/22 0421  NA 150* 151* 148* 145 145 149*  K 2.5* 2.8* 3.3* 4.1 3.1* 3.9  CL 111  --  110 110 110 114*  CO2 27  --  '26 24 23 25  '$ GLUCOSE 414*  --  165* 322* 197* 200*  BUN 22  --  24* 37* 47* 54*  CREATININE 1.85*  --  2.90* 4.41* 5.57* 5.64*  CALCIUM 9.8  --  8.6* 7.7* 7.6* 8.0*  MG 2.2  --  1.8 1.9 2.0 1.9  PHOS <1.0*  --  3.5 6.4* 4.3 4.8*   GFR: Estimated  Creatinine Clearance: 9.2 mL/min (A) (by C-G formula based on SCr of 5.64 mg/dL (H)). Recent Labs  Lab 04/09/22 1100 04/09/22 1833 04/10/22 0642 04/10/22 1246 04/10/22 2359 04/12/22 0503 04/13/22 0419 04/14/22 0421  WBC 21.3*  --    < >  --  19.3* 14.9* 12.7* 7.0  LATICACIDVEN >9.0* 7.4*  --  4.9* 2.8*  --   --   --    < > = values in this interval not displayed.    Liver Function Tests: Recent Labs  Lab 04/09/22 1330  AST 51*  ALT 39  ALKPHOS 136*  BILITOT 0.8  PROT 6.1*  ALBUMIN 3.3*   No results for input(s): "LIPASE", "AMYLASE" in the last 168 hours. No results for input(s): "AMMONIA" in the last 168 hours.  ABG    Component Value Date/Time   PHART 7.459 (H) 04/10/2022 1035   PCO2ART 35.1 04/10/2022 1035   PO2ART 68 (L) 04/10/2022 1035   HCO3 24.9 04/10/2022 1035  TCO2 26 04/10/2022 1035   ACIDBASEDEF 4.0 (H) 04/09/2022 1450   O2SAT 94 04/10/2022 1035     Coagulation Profile: Recent Labs  Lab 04/09/22 1100  INR 1.2    Cardiac Enzymes: Recent Labs  Lab 04/09/22 1330  CKTOTAL 190    HbA1C: Hgb A1c MFr Bld  Date/Time Value Ref Range Status  04/12/2022 05:03 AM >15.5 (H) 4.8 - 5.6 % Final    Comment:    (NOTE)         Prediabetes: 5.7 - 6.4         Diabetes: >6.4         Glycemic control for adults with diabetes: <7.0   07/22/2021 04:05 PM 13.5 (H) 4.6 - 6.5 % Final    Comment:    Glycemic Control Guidelines for People with Diabetes:Non Diabetic:  <6%Goal of Therapy: <7%Additional Action Suggested:  >8%     CBG: Recent Labs  Lab 04/14/22 0154 04/14/22 0415 04/14/22 0417 04/14/22 0630 04/14/22 0910  GLUCAP 169* 185* 180* 161* 156*   The patient is critically ill with multiple organ systems failure and requires high complexity decision making for assessment and support, frequent evaluation and titration of therapies, application of advanced monitoring technologies and extensive interpretation of multiple databases. Critical Care Time  devoted to patient care services described in this note independent of APP/resident time (if applicable)  is 32 minutes.   Sherrilyn Rist MD [facility] Personal pager: See Amion If unanswered, please page CCM On-call: [phone]

## 2022-04-14 NOTE — Progress Notes (Signed)
ANTICOAGULATION CONSULT NOTE  Pharmacy Consult for heparin  Indication: pulmonary embolus and clot right ventricle  Allergies  Allergen Reactions   Shingrix [Zoster Vac Recomb Adjuvanted]     Allergy to original live zoster - rash    Patient Measurements: Height: '5\' 3"'$  (160 cm) Weight: 78.5 kg (173 lb 1 oz) IBW/kg (Calculated) : 52.4 Heparin Dosing Weight: 68kg  Vital Signs: Temp: 97.5 F (36.4 C) (12/20 0746) Temp Source: Bladder (12/20 0746) BP: 110/64 (12/20 0400) Pulse Rate: 85 (12/20 0400)  Labs: Recent Labs    04/12/22 0503 04/12/22 1344 04/12/22 2133 04/13/22 0419 04/13/22 1425 04/14/22 0421  HGB 9.3*  --   --  8.4* 8.0* 8.0*  HCT 28.8*  --   --  26.8* 25.5* 26.1*  PLT 115*  --   --  107*  --  104*  HEPARINUNFRC 0.15*   < > 0.37 0.29*  --  <0.10*  CREATININE 4.41*  --   --  5.57*  --  5.64*   < > = values in this interval not displayed.     Estimated Creatinine Clearance: 9.2 mL/min (A) (by C-G formula based on SCr of 5.64 mg/dL (H)).  Assessment: Pt was admitted for being unresponsive with CPR administered. Pt went into cardiac arrest and found to have thrombus in right ventricle on ECHO. TNK was administered. Heparin gtt ordered for anticoagulation post TNK.   Heparin was held 12/19 as patient was experiencing blood in her urine. CT negative for retroperitoneal bleed. No other signs of bleeding. Discussed with CCM - okay to restart heparin today.  Goal of Therapy:  Heparin level 0.3-0.7 units/ml Monitor platelets by anticoagulation protocol: Yes   Plan:  Restart heparin at 800 units/hr Check heparin level at 2000 Daily heparin level and CBC Watch platelets - have trended down from 232 > 104  Erskine Speed, PharmD Clinical Pharmacist 04/14/2022 10:07 AM

## 2022-04-14 NOTE — Progress Notes (Signed)
[facility] Progress Note   Assessment/ Plan:   AKI on presumed CKD- has uncontrolled DM and HTN at baseline so think that probably has some baseline CKD.  Has some oliguria but appears to be picking up from the past few days so hopefully we are seeing some beginning signs of recovery?             - K repleted             - IV Lasix 80 x 1 yesterday, robust response, will hold off today             - renal US with small kidneys, no hydro  - hopefully we are seeing a plateau- no indication for HD today, hoping we can get by without it   2.  Massive PE             - RV clot, got TNK and now on hep gtt   3.  Cardiac arrest: secondary to #2, MRI 12/17 didn't look too bad, now waking up   4.  DM II: on insulin gtt, per primary   5.  Hypokalemia: repleting  6.  Hypernatremia: mild, CTM   7.  Dispo: ICU  Subjective:    Robust response to IV Lasix yesterday.  Cr plateaued.  Na up a little.  Dtr Gilmore Laroche (my HD pt) at her bedside.     Objective:   BP (!) 141/80   Pulse 100   Temp 98.1 F (36.7 C) (Bladder)   Resp (!) 27   Ht '5\' 3"'$  (1.6 m)   Wt 78.5 kg   SpO2 98%   BMI 30.66 kg/m   Intake/Output Summary (Last 24 hours) at 04/14/2022 1109 Last data filed at 04/14/2022 0900 Gross per 24 hour  Intake 3391.82 ml  Output 3680 ml  Net -288.18 ml   Weight change:   Physical Exam: GEN ill-appearing, intubated and sedated but able to respond to questions HEENT eyes closed NECK no overt JVD PULM coarse mechanical bilaterally CV RRR ABD soft EXT 1+ LE edema NEURO intubated, sedated, + mitts  Imaging: DG CHEST PORT 1 VIEW  Result Date: 04/14/2022 CLINICAL DATA:  Acute respiratory failure; history diabetes mellitus, hypertension, asthma EXAM: PORTABLE CHEST 1 VIEW COMPARISON:  Portable exam 0535 hours compared to 04/09/2022 FINDINGS: Tip of endotracheal tube projects 2.8 cm above carina. Orogastric tube extends into stomach. RIGHT jugular central venous catheter  tip projects over SVC. Upper normal heart size with prominent hila again identified. Atherosclerotic calcification aorta. Extensive RIGHT lung infiltrates and to a lesser degree LEFT lower lobe infiltrate, increased since previous exam. Persistent LEFT suprahilar opacity likely representing atelectasis. Small bibasilar pleural effusions. No pneumothorax or acute osseous findings. IMPRESSION: Significantly increased BILATERAL pulmonary infiltrates asymmetrically greater on RIGHT, favor multifocal pneumonia over edema. Bibasilar pleural effusions. LEFT upper lobe opacity favoring atelectasis though both hila remain enlarged, mass and adenopathy not excluded; assessment consider CT assessment. Electronically Signed   By: Lavonia Dana M.D.   On: 04/14/2022 08:48   CT ABDOMEN PELVIS WO CONTRAST  Result Date: 04/13/2022 CLINICAL DATA:  Retroperitoneal bleed suspected. EXAM: CT ABDOMEN AND PELVIS WITHOUT CONTRAST TECHNIQUE: Multidetector CT imaging of the abdomen and pelvis was performed following the standard protocol without IV contrast. RADIATION DOSE REDUCTION: This exam was performed according to the departmental dose-optimization program which includes automated exposure control, adjustment of the mA and/or kV according to patient size and/or use of iterative reconstruction technique. COMPARISON:  11/09/2020 FINDINGS:  Lower chest: Bilateral lower lobe collapse/consolidation with small right and small to moderate left pleural effusions. High density material is seen in the parahilar right lung but only on the first 2 images, indeterminate. Hepatobiliary: No suspicious focal abnormality in the liver on this study without intravenous contrast. Gallbladder is surgically absent. No intrahepatic or extrahepatic biliary dilation. Pancreas: No focal mass lesion. No dilatation of the main duct. No intraparenchymal cyst. No peripancreatic edema. Spleen: No splenomegaly. No focal mass lesion. Adrenals/Urinary Tract: No  adrenal nodule or mass. Scarring noted upper pole right kidney. Left kidney unremarkable. No evidence for hydroureter. Bladder is decompressed by Foley catheter. Gas in the bladder lumen is compatible with the instrumentation. Stomach/Bowel: NG tube tip is in the mid stomach. Stomach is moderately distended with fluid and gas. Duodenum is normally positioned as is the ligament of Treitz. No small bowel wall thickening. No small bowel dilatation. The terminal ileum is normal. The appendix is normal. No gross colonic mass. No colonic wall thickening. Vascular/Lymphatic: There is mild atherosclerotic calcification of the abdominal aorta without aneurysm. There is no gastrohepatic or hepatoduodenal ligament lymphadenopathy. No retroperitoneal or mesenteric lymphadenopathy. No pelvic sidewall lymphadenopathy. Reproductive: The uterus is unremarkable.  There is no adnexal mass. Other: No intraperitoneal free fluid. No retroperitoneal hematoma. 16 mm nodular soft tissue lesion subcutaneous fat anterior right abdominal wall likely an injection site. There is diffuse body wall edema in the subcutaneous fat. Musculoskeletal: No worrisome lytic or sclerotic osseous abnormality. IMPRESSION: 1. No retroperitoneal hematoma. No intraperitoneal free fluid. 2. Bilateral lower lobe collapse/consolidation with small right and small to moderate left pleural effusions. 3. High density material is seen in the parahilar right lung but only on the first 2 images, incompletely visualized and of indeterminate significance. The right chest tube is visible on the scout image. 4. Diffuse body wall edema in the subcutaneous fat. 5.  Aortic Atherosclerosis (ICD10-I70.0). Electronically Signed   By: Misty Stanley M.D.   On: 04/13/2022 16:49   US RENAL  Result Date: 04/13/2022 CLINICAL DATA:  Acute kidney injury, history type II diabetes mellitus, hypertension EXAM: RENAL / URINARY TRACT ULTRASOUND COMPLETE COMPARISON:  None Available.  FINDINGS: Right Kidney: Renal measurements: 12.7 x 5.5 x 7.0 cm = volume: 245 mL. Normal cortical thickness and echogenicity. No mass, hydronephrosis or shadowing calcification. 11.2 x 6.1 x 7.7 cm Left Kidney: Renal measurements: 275 = volume: Normal cortical thickness and echogenicity. No mass, hydronephrosis or shadowing calcification. mL. Echogenicity within normal limits. No mass or hydronephrosis visualized. Bladder: Appears normal for degree of bladder distention. Foley catheter within urinary bladder. Other: Small BILATERAL pleural effusions. IMPRESSION: No renal sonographic abnormalities. Small BILATERAL pleural effusions. Electronically Signed   By: Lavonia Dana M.D.   On: 04/13/2022 12:53    Labs: BMET Recent Labs  Lab 04/09/22 1833 04/10/22 0244 04/10/22 0642 04/10/22 1035 04/10/22 2359 04/12/22 0503 04/13/22 0419 04/14/22 0421  NA 147* 148* 150* 151* 148* 145 145 149*  K 2.0* 3.5 2.5* 2.8* 3.3* 4.1 3.1* 3.9  CL 106 110 111  --  110 110 110 114*  CO2 '22 27 27  '$ --  '26 24 23 25  '$ GLUCOSE 821* 466* 414*  --  165* 322* 197* 200*  BUN '21 20 22  '$ --  24* 37* 47* 54*  CREATININE 1.99* 1.79* 1.85*  --  2.90* 4.41* 5.57* 5.64*  CALCIUM 9.6 9.9 9.8  --  8.6* 7.7* 7.6* 8.0*  PHOS  --   --  <1.0*  --  3.5 6.4* 4.3 4.8*   CBC Recent Labs  Lab 04/09/22 1100 04/09/22 1158 04/10/22 2359 04/12/22 0503 04/13/22 0419 04/13/22 1425 04/14/22 0421  WBC 21.3*   < > 19.3* 14.9* 12.7*  --  7.0  NEUTROABS 17.9*  --   --   --   --   --   --   HGB 12.1   < > 11.6* 9.3* 8.4* 8.0* 8.0*  HCT 44.5   < > 35.9* 28.8* 26.8* 25.5* 26.1*  MCV 86.7   < > 73.6* 72.2* 74.4*  --  75.4*  PLT 232   < > 153 115* 107*  --  104*   < > = values in this interval not displayed.    Medications:     Chlorhexidine Gluconate Cloth  6 each Topical Daily   clotrimazole  1 Applicatorful Vaginal QHS   docusate  100 mg Per Tube BID   feeding supplement (PROSource TF20)  60 mL Per Tube Daily   ipratropium-albuterol   3 mL Nebulization Q6H   methylPREDNISolone (SOLU-MEDROL) injection  40 mg Intravenous Q8H   mouth rinse  15 mL Mouth Rinse Q2H   pantoprazole (PROTONIX) IV  40 mg Intravenous Q12H   polyethylene glycol  17 g Per Tube Daily    Madelon Lips MD 04/14/2022, 11:09 AM

## 2022-04-15 ENCOUNTER — Inpatient Hospital Stay (HOSPITAL_COMMUNITY): Payer: Medicare PPO

## 2022-04-15 ENCOUNTER — Encounter (HOSPITAL_COMMUNITY): Payer: Medicare PPO

## 2022-04-15 DIAGNOSIS — R569 Unspecified convulsions: Secondary | ICD-10-CM | POA: Diagnosis not present

## 2022-04-15 DIAGNOSIS — J9601 Acute respiratory failure with hypoxia: Secondary | ICD-10-CM | POA: Diagnosis not present

## 2022-04-15 DIAGNOSIS — Z794 Long term (current) use of insulin: Secondary | ICD-10-CM

## 2022-04-15 DIAGNOSIS — G9340 Encephalopathy, unspecified: Secondary | ICD-10-CM | POA: Diagnosis not present

## 2022-04-15 DIAGNOSIS — I2699 Other pulmonary embolism without acute cor pulmonale: Secondary | ICD-10-CM

## 2022-04-15 DIAGNOSIS — E119 Type 2 diabetes mellitus without complications: Secondary | ICD-10-CM

## 2022-04-15 DIAGNOSIS — I469 Cardiac arrest, cause unspecified: Secondary | ICD-10-CM | POA: Diagnosis not present

## 2022-04-15 LAB — CBC
HCT: 26.2 % — ABNORMAL LOW (ref 36.0–46.0)
Hemoglobin: 8.4 g/dL — ABNORMAL LOW (ref 12.0–15.0)
MCH: 23.4 pg — ABNORMAL LOW (ref 26.0–34.0)
MCHC: 32.1 g/dL (ref 30.0–36.0)
MCV: 73 fL — ABNORMAL LOW (ref 80.0–100.0)
Platelets: 105 10*3/uL — ABNORMAL LOW (ref 150–400)
RBC: 3.59 MIL/uL — ABNORMAL LOW (ref 3.87–5.11)
RDW: 15.7 % — ABNORMAL HIGH (ref 11.5–15.5)
WBC: 5.1 10*3/uL (ref 4.0–10.5)
nRBC: 0.4 % — ABNORMAL HIGH (ref 0.0–0.2)

## 2022-04-15 LAB — BASIC METABOLIC PANEL
Anion gap: 10 (ref 5–15)
BUN: 66 mg/dL — ABNORMAL HIGH (ref 8–23)
CO2: 24 mmol/L (ref 22–32)
Calcium: 8.4 mg/dL — ABNORMAL LOW (ref 8.9–10.3)
Chloride: 115 mmol/L — ABNORMAL HIGH (ref 98–111)
Creatinine, Ser: 5.48 mg/dL — ABNORMAL HIGH (ref 0.44–1.00)
GFR, Estimated: 8 mL/min — ABNORMAL LOW (ref >60)
Glucose, Bld: 233 mg/dL — ABNORMAL HIGH (ref 70–99)
Potassium: 4.4 mmol/L (ref 3.5–5.1)
Sodium: 149 mmol/L — ABNORMAL HIGH (ref 135–145)

## 2022-04-15 LAB — HEPARIN LEVEL (UNFRACTIONATED)
Heparin Unfractionated: 0.33 IU/mL (ref 0.30–0.70)
Heparin Unfractionated: 0.36 IU/mL (ref 0.30–0.70)

## 2022-04-15 LAB — GLUCOSE, CAPILLARY
Glucose-Capillary: 139 mg/dL — ABNORMAL HIGH (ref 70–99)
Glucose-Capillary: 149 mg/dL — ABNORMAL HIGH (ref 70–99)
Glucose-Capillary: 152 mg/dL — ABNORMAL HIGH (ref 70–99)
Glucose-Capillary: 159 mg/dL — ABNORMAL HIGH (ref 70–99)
Glucose-Capillary: 164 mg/dL — ABNORMAL HIGH (ref 70–99)
Glucose-Capillary: 164 mg/dL — ABNORMAL HIGH (ref 70–99)
Glucose-Capillary: 166 mg/dL — ABNORMAL HIGH (ref 70–99)
Glucose-Capillary: 167 mg/dL — ABNORMAL HIGH (ref 70–99)
Glucose-Capillary: 168 mg/dL — ABNORMAL HIGH (ref 70–99)
Glucose-Capillary: 170 mg/dL — ABNORMAL HIGH (ref 70–99)
Glucose-Capillary: 172 mg/dL — ABNORMAL HIGH (ref 70–99)
Glucose-Capillary: 178 mg/dL — ABNORMAL HIGH (ref 70–99)
Glucose-Capillary: 180 mg/dL — ABNORMAL HIGH (ref 70–99)
Glucose-Capillary: 181 mg/dL — ABNORMAL HIGH (ref 70–99)
Glucose-Capillary: 182 mg/dL — ABNORMAL HIGH (ref 70–99)
Glucose-Capillary: 188 mg/dL — ABNORMAL HIGH (ref 70–99)
Glucose-Capillary: 189 mg/dL — ABNORMAL HIGH (ref 70–99)
Glucose-Capillary: 193 mg/dL — ABNORMAL HIGH (ref 70–99)
Glucose-Capillary: 202 mg/dL — ABNORMAL HIGH (ref 70–99)
Glucose-Capillary: 205 mg/dL — ABNORMAL HIGH (ref 70–99)
Glucose-Capillary: 206 mg/dL — ABNORMAL HIGH (ref 70–99)

## 2022-04-15 MED ORDER — ALBUMIN HUMAN 25 % IV SOLN
25.0000 g | Freq: Once | INTRAVENOUS | Status: AC
  Administered 2022-04-15: 25 g via INTRAVENOUS
  Filled 2022-04-15: qty 100

## 2022-04-15 MED ORDER — HYDRALAZINE HCL 20 MG/ML IJ SOLN
10.0000 mg | INTRAMUSCULAR | Status: DC | PRN
  Administered 2022-04-16: 20 mg via INTRAVENOUS
  Administered 2022-04-16: 10 mg via INTRAVENOUS
  Filled 2022-04-15 (×2): qty 1

## 2022-04-15 NOTE — Progress Notes (Signed)
ANTICOAGULATION CONSULT NOTE  Pharmacy Consult for heparin  Indication: pulmonary embolus and clot right ventricle  Allergies  Allergen Reactions   Shingrix [Zoster Vac Recomb Adjuvanted]     Allergy to original live zoster - rash    Patient Measurements: Height: '5\' 3"'$  (160 cm) Weight: 78.5 kg (173 lb 1 oz) IBW/kg (Calculated) : 52.4 Heparin Dosing Weight: 68kg  Vital Signs: Temp: 99.3 F (37.4 C) (12/21 1500) Temp Source: Bladder (12/21 1125) BP: 155/73 (12/21 1500) Pulse Rate: 86 (12/21 1500)  Labs: Recent Labs    04/13/22 0419 04/13/22 1425 04/14/22 0421 04/14/22 1938 04/15/22 0513 04/15/22 1407  HGB 8.4* 8.0* 8.0*  --  8.4*  --   HCT 26.8* 25.5* 26.1*  --  26.2*  --   PLT 107*  --  104*  --  105*  --   HEPARINUNFRC 0.29*  --  <0.10* 0.14* 0.33 0.36  CREATININE 5.57*  --  5.64*  --  5.48*  --     Estimated Creatinine Clearance: 9.5 mL/min (A) (by C-G formula based on SCr of 5.48 mg/dL (H)).  Assessment: Pt was admitted for being unresponsive with CPR administered. Pt went into cardiac arrest and found to have thrombus in right ventricle on ECHO. TNK was administered. Heparin gtt ordered for anticoagulation post TNK.   Heparin was held 12/19 as patient was experiencing blood in her urine. CT negative for retroperitoneal bleed. No other signs of bleeding. All resolved.   Heparin levels are 0.33 and 0.36 today - stable and therapeutic on current rate. Hgb low-stable and Platelets low-stable at 105 for past 4 checks. Will continue to monitor.   Goal of Therapy:  Heparin level 0.3-0.7 units/ml Monitor platelets by anticoagulation protocol: Yes   Plan:  Continue heparin to 950 units/hr Daily heparin level and CBC Continue to monitor platelets closely.   Sloan Leiter, PharmD, BCPS, BCCCP Please refer to [facility] for [place] numbers 04/15/2022, 3:57 PM

## 2022-04-15 NOTE — Progress Notes (Signed)
Lower extremity venous bilateral study completed.   Please see CV Proc for preliminary results.   Deborah Neal, RDMS, RVT  

## 2022-04-15 NOTE — Progress Notes (Signed)
NAME:  Deborah Neal, MRN:  [id number], DOB:  1950/11/05, LOS: 6 ADMISSION DATE:  04/09/2022, CONSULTATION DATE: 12/15 REFERRING MD: Dr. Vanita Panda EDP, CHIEF COMPLAINT: Cardiac arrest  History of Present Illness:  71 year old female with past medical history as below, which is significant for hypertension, hyperlipidemia, type 2 diabetes mellitus, and asthma.  She was in her usual state of health until approximately 5:30 AM on 12/15 when she was seen by granddaughter.  Family attempted to contact her via phone around 7 AM and she did not answer.  When they later went to check on her around 10 AM she was unresponsive in her bed with red emesis seen around her.  Upon arrival to the fire department the patient was unresponsive but did have pulse and respirations. Then suffered seizure.  Upon EMS arrival patient lost pulses and CPR was initiated.   CPR less than 5 minutes.  Patient demonstrated seizure-like activity en route to [facility] emergency department.  Improved with benzodiazepine administration.  Upon arrival to emergency department the patient was intubated.  She remained unresponsive at the time PCCM was asked to admit.  Pertinent  Medical History   has a past medical history of ALLERGIC RHINITIS (01/29/2007), Arthritis, ASTHMA (01/29/2007), ASTHMA, WITH ACUTE EXACERBATION (06/24/2008), BACK PAIN (01/29/2009), CHEST PAIN (06/24/2008), Family history of adverse reaction to anesthesia, GERD (gastroesophageal reflux disease), History of shingles, HYPERLIPIDEMIA (01/29/2007), HYPERTENSION (01/29/2007), OSTEOPENIA (10/16/2007), and Type II or unspecified type diabetes mellitus without mention of complication, uncontrolled (11/29/2013).   Significant Hospital Events: Including procedures, antibiotic start and stop dates in addition to other pertinent events   12/15 admit to [place] s/p cardiac arrest 12/17 MRI - neg acute, stable moderate atrophy and white matter disease. This likely reflects the sequela of  chronic microvascular ischemia. 12/20-weaning well, no cuff leak. Steroids started.   Interim History / Subjective:  Hungry, no other complaints.  No acute events overnight.  S/p 24 hours steroids for no cuff leak yesterday   Objective   Blood pressure (!) 150/71, pulse 73, temperature 98.6 F (37 C), resp. rate 16, height '5\' 3"'$  (1.6 m), weight 78.5 kg, SpO2 98 %.    Vent Mode: PRVC FiO2 (%):  [40 %] 40 % Set Rate:  [24 bmp] 24 bmp Vt Set:  [420 mL] 420 mL PEEP:  [5 cmH20] 5 cmH20 Pressure Support:  [10 cmH20] 10 cmH20 Plateau Pressure:  [23 cmH20-27 cmH20] 23 cmH20   Intake/Output Summary (Last 24 hours) at 04/15/2022 0842 Last data filed at 04/15/2022 0800 Gross per 24 hour  Intake 2483.93 ml  Output 1945 ml  Net 538.93 ml    Filed Weights   04/12/22 0304 04/13/22 0251 04/15/22 0500  Weight: 74.7 kg 78.5 kg 78.5 kg    Examination:  General: elderly female in NAD HEENT: [place]/AT, PERRL, no JVD Neuro: Alert, oriented, non-focal Chest:  Clear bilateral breath sounds. 12/5. Tolerating well.  Heart: RRR, no MRG Abdomen: Soft, NT, ND Skin: Grossly intact  Resolved Hospital Problem list     Assessment & Plan:   Status post out-of-hospital PEA cardiac arrest In hospital for V-fib/V. tach cardiac arrest in the setting of severe hypokalemia Possible massive PE -Did receive TNKase, doppler legs -Completed normothermia protocol -Continue electrolyte repletion  Acute respiratory failure with hypoxia Aspiration pneumonia -Weaning vent, hopeful for extubation - ABX completed  Seizure-like activity EEG negative for seizures -On Keppra at present -? When we can discontinue. Not a home med. If she did have seizure  it was likely a result of profound metabolic disarray.  Acute kidney injury due to ischemic ATN -Appreciate nephrology -UOP has picked up a lot, but renal indices still worsening.   Demand ischemia -Post CPR -No wall motion abnormalities on echo  Anemia:  hgb stabilized - Concern for GI bleed/retroperitoneal bleed - CT scan negative for retroperitoneal bleed  Diabetic ketoacidosis -If she extubates will attempt to transition to basal + SSI -Attempting to transition off insulin gtt.  Acute metabolic encephalopathy Concern for anoxic injury -She is appropriate in interaction -Wake up assessment seems to be going well -Will attempt to wean and extubate   Best Practice (right click and "Reselect all SmartList Selections" daily)   Diet/type: NPO TF DVT prophylaxis: Systemic heparin GI prophylaxis: Protonix Lines: N/A Foley:  Yes, and it is still needed Code Status:  full code Last date of multidisciplinary goals of care discussion [10/21: Patient's daughter was updated at bedside  Labs   CBC: Recent Labs  Lab 04/09/22 1100 04/09/22 1158 04/10/22 2359 04/12/22 0503 04/13/22 0419 04/13/22 1425 04/14/22 0421 04/15/22 0513  WBC 21.3*   < > 19.3* 14.9* 12.7*  --  7.0 5.1  NEUTROABS 17.9*  --   --   --   --   --   --   --   HGB 12.1   < > 11.6* 9.3* 8.4* 8.0* 8.0* 8.4*  HCT 44.5   < > 35.9* 28.8* 26.8* 25.5* 26.1* 26.2*  MCV 86.7   < > 73.6* 72.2* 74.4*  --  75.4* 73.0*  PLT 232   < > 153 115* 107*  --  104* 105*   < > = values in this interval not displayed.     Basic Metabolic Panel: Recent Labs  Lab 04/10/22 0642 04/10/22 1035 04/10/22 2359 04/12/22 0503 04/13/22 0419 04/14/22 0421 04/15/22 0513  NA 150*   < > 148* 145 145 149* 149*  K 2.5*   < > 3.3* 4.1 3.1* 3.9 4.4  CL 111  --  110 110 110 114* 115*  CO2 27  --  '26 24 23 25 24  '$ GLUCOSE 414*  --  165* 322* 197* 200* 233*  BUN 22  --  24* 37* 47* 54* 66*  CREATININE 1.85*  --  2.90* 4.41* 5.57* 5.64* 5.48*  CALCIUM 9.8  --  8.6* 7.7* 7.6* 8.0* 8.4*  MG 2.2  --  1.8 1.9 2.0 1.9  --   PHOS <1.0*  --  3.5 6.4* 4.3 4.8*  --    < > = values in this interval not displayed.    GFR: Estimated Creatinine Clearance: 9.5 mL/min (A) (by C-G formula based on SCr of  5.48 mg/dL (H)). Recent Labs  Lab 04/09/22 1100 04/09/22 1833 04/10/22 0642 04/10/22 1246 04/10/22 2359 04/12/22 0503 04/13/22 0419 04/14/22 0421 04/15/22 0513  WBC 21.3*  --    < >  --  19.3* 14.9* 12.7* 7.0 5.1  LATICACIDVEN >9.0* 7.4*  --  4.9* 2.8*  --   --   --   --    < > = values in this interval not displayed.     Liver Function Tests: Recent Labs  Lab 04/09/22 1330  AST 51*  ALT 39  ALKPHOS 136*  BILITOT 0.8  PROT 6.1*  ALBUMIN 3.3*    No results for input(s): "LIPASE", "AMYLASE" in the last 168 hours. No results for input(s): "AMMONIA" in the last 168 hours.  ABG    Component Value Date/Time  PHART 7.459 (H) 04/10/2022 1035   PCO2ART 35.1 04/10/2022 1035   PO2ART 68 (L) 04/10/2022 1035   HCO3 24.9 04/10/2022 1035   TCO2 26 04/10/2022 1035   ACIDBASEDEF 4.0 (H) 04/09/2022 1450   O2SAT 94 04/10/2022 1035     Coagulation Profile: Recent Labs  Lab 04/09/22 1100  INR 1.2     Cardiac Enzymes: Recent Labs  Lab 04/09/22 1330  CKTOTAL 190     HbA1C: Hgb A1c MFr Bld  Date/Time Value Ref Range Status  04/12/2022 05:03 AM >15.5 (H) 4.8 - 5.6 % Final    Comment:    (NOTE)         Prediabetes: 5.7 - 6.4         Diabetes: >6.4         Glycemic control for adults with diabetes: <7.0   07/22/2021 04:05 PM 13.5 (H) 4.6 - 6.5 % Final    Comment:    Glycemic Control Guidelines for People with Diabetes:Non Diabetic:  <6%Goal of Therapy: <7%Additional Action Suggested:  >8%     CBG: Recent Labs  Lab 04/15/22 0355 04/15/22 0500 04/15/22 0549 04/15/22 0656 04/15/22 0745  GLUCAP 202* 205* 206* 164* 164*   Critical care time: 48 minutes  Georgann Housekeeper, AGACNP-BC [facility]  See Amion for personal pager PCCM on call pager [phone] until 7pm. Please call Elink 7p-7a. [phone]  04/15/2022 9:25 AM

## 2022-04-15 NOTE — Progress Notes (Signed)
[facility] Progress Note   Assessment/ Plan:   AKI on presumed CKD- has uncontrolled DM and HTN at baseline so think that probably has some baseline CKD.  Has some oliguria but appears to be picking up from the past few days so hopefully we are seeing some beginning signs of recovery?             - K repleted             - IV Lasix 80 x 1 12/19 robust response, will hold off             - renal US with small kidneys, no hydro  - allow to autodiurese, albumin x 1 today d/t mild hypernatremia   2.  Massive PE             - RV clot, got TNK and now on hep gtt   3.  Cardiac arrest: secondary to #2, MRI 12/17 didn't look too bad, now waking up   4.  DM II: on insulin gtt, per primary   5.  Hypokalemia: repleting  6.  Hypernatremia: mild, CTM   7.  Dispo: ICU  Subjective:    Cr coming down, appears to be autodiuresing a little.     Objective:   BP (!) 150/71   Pulse 73   Temp 98.6 F (37 C)   Resp 16   Ht '5\' 3"'$  (1.6 m)   Wt 78.5 kg   SpO2 99%   BMI 30.66 kg/m   Intake/Output Summary (Last 24 hours) at 04/15/2022 1024 Last data filed at 04/15/2022 0900 Gross per 24 hour  Intake 2260.92 ml  Output 1695 ml  Net 565.92 ml   Weight change:   Physical Exam: GEN ill-appearing, intubated and sedated but able to respond to questions HEENT eyes closed NECK no overt JVD PULM coarse mechanical bilaterally CV RRR ABD soft EXT 1+ LE edema NEURO intubated, sedated, + mitts  Imaging: DG CHEST PORT 1 VIEW  Result Date: 04/14/2022 CLINICAL DATA:  Acute respiratory failure; history diabetes mellitus, hypertension, asthma EXAM: PORTABLE CHEST 1 VIEW COMPARISON:  Portable exam 0535 hours compared to 04/09/2022 FINDINGS: Tip of endotracheal tube projects 2.8 cm above carina. Orogastric tube extends into stomach. RIGHT jugular central venous catheter tip projects over SVC. Upper normal heart size with prominent hila again identified. Atherosclerotic calcification  aorta. Extensive RIGHT lung infiltrates and to a lesser degree LEFT lower lobe infiltrate, increased since previous exam. Persistent LEFT suprahilar opacity likely representing atelectasis. Small bibasilar pleural effusions. No pneumothorax or acute osseous findings. IMPRESSION: Significantly increased BILATERAL pulmonary infiltrates asymmetrically greater on RIGHT, favor multifocal pneumonia over edema. Bibasilar pleural effusions. LEFT upper lobe opacity favoring atelectasis though both hila remain enlarged, mass and adenopathy not excluded; assessment consider CT assessment. Electronically Signed   By: Lavonia Dana M.D.   On: 04/14/2022 08:48   CT ABDOMEN PELVIS WO CONTRAST  Result Date: 04/13/2022 CLINICAL DATA:  Retroperitoneal bleed suspected. EXAM: CT ABDOMEN AND PELVIS WITHOUT CONTRAST TECHNIQUE: Multidetector CT imaging of the abdomen and pelvis was performed following the standard protocol without IV contrast. RADIATION DOSE REDUCTION: This exam was performed according to the departmental dose-optimization program which includes automated exposure control, adjustment of the mA and/or kV according to patient size and/or use of iterative reconstruction technique. COMPARISON:  11/09/2020 FINDINGS: Lower chest: Bilateral lower lobe collapse/consolidation with small right and small to moderate left pleural effusions. High density material is seen in the parahilar right  lung but only on the first 2 images, indeterminate. Hepatobiliary: No suspicious focal abnormality in the liver on this study without intravenous contrast. Gallbladder is surgically absent. No intrahepatic or extrahepatic biliary dilation. Pancreas: No focal mass lesion. No dilatation of the main duct. No intraparenchymal cyst. No peripancreatic edema. Spleen: No splenomegaly. No focal mass lesion. Adrenals/Urinary Tract: No adrenal nodule or mass. Scarring noted upper pole right kidney. Left kidney unremarkable. No evidence for hydroureter.  Bladder is decompressed by Foley catheter. Gas in the bladder lumen is compatible with the instrumentation. Stomach/Bowel: NG tube tip is in the mid stomach. Stomach is moderately distended with fluid and gas. Duodenum is normally positioned as is the ligament of Treitz. No small bowel wall thickening. No small bowel dilatation. The terminal ileum is normal. The appendix is normal. No gross colonic mass. No colonic wall thickening. Vascular/Lymphatic: There is mild atherosclerotic calcification of the abdominal aorta without aneurysm. There is no gastrohepatic or hepatoduodenal ligament lymphadenopathy. No retroperitoneal or mesenteric lymphadenopathy. No pelvic sidewall lymphadenopathy. Reproductive: The uterus is unremarkable.  There is no adnexal mass. Other: No intraperitoneal free fluid. No retroperitoneal hematoma. 16 mm nodular soft tissue lesion subcutaneous fat anterior right abdominal wall likely an injection site. There is diffuse body wall edema in the subcutaneous fat. Musculoskeletal: No worrisome lytic or sclerotic osseous abnormality. IMPRESSION: 1. No retroperitoneal hematoma. No intraperitoneal free fluid. 2. Bilateral lower lobe collapse/consolidation with small right and small to moderate left pleural effusions. 3. High density material is seen in the parahilar right lung but only on the first 2 images, incompletely visualized and of indeterminate significance. The right chest tube is visible on the scout image. 4. Diffuse body wall edema in the subcutaneous fat. 5.  Aortic Atherosclerosis (ICD10-I70.0). Electronically Signed   By: Misty Stanley M.D.   On: 04/13/2022 16:49   US RENAL  Result Date: 04/13/2022 CLINICAL DATA:  Acute kidney injury, history type II diabetes mellitus, hypertension EXAM: RENAL / URINARY TRACT ULTRASOUND COMPLETE COMPARISON:  None Available. FINDINGS: Right Kidney: Renal measurements: 12.7 x 5.5 x 7.0 cm = volume: 245 mL. Normal cortical thickness and echogenicity.  No mass, hydronephrosis or shadowing calcification. 11.2 x 6.1 x 7.7 cm Left Kidney: Renal measurements: 275 = volume: Normal cortical thickness and echogenicity. No mass, hydronephrosis or shadowing calcification. mL. Echogenicity within normal limits. No mass or hydronephrosis visualized. Bladder: Appears normal for degree of bladder distention. Foley catheter within urinary bladder. Other: Small BILATERAL pleural effusions. IMPRESSION: No renal sonographic abnormalities. Small BILATERAL pleural effusions. Electronically Signed   By: Lavonia Dana M.D.   On: 04/13/2022 12:53    Labs: BMET Recent Labs  Lab 04/10/22 0244 04/10/22 7619 04/10/22 1035 04/10/22 2359 04/12/22 0503 04/13/22 0419 04/14/22 0421 04/15/22 0513  NA 148* 150* 151* 148* 145 145 149* 149*  K 3.5 2.5* 2.8* 3.3* 4.1 3.1* 3.9 4.4  CL 110 111  --  110 110 110 114* 115*  CO2 27 27  --  '26 24 23 25 24  '$ GLUCOSE 466* 414*  --  165* 322* 197* 200* 233*  BUN 20 22  --  24* 37* 47* 54* 66*  CREATININE 1.79* 1.85*  --  2.90* 4.41* 5.57* 5.64* 5.48*  CALCIUM 9.9 9.8  --  8.6* 7.7* 7.6* 8.0* 8.4*  PHOS  --  <1.0*  --  3.5 6.4* 4.3 4.8*  --    CBC Recent Labs  Lab 04/09/22 1100 04/09/22 1158 04/12/22 0503 04/13/22 0419 04/13/22 1425 04/14/22 0421  04/15/22 0513  WBC 21.3*   < > 14.9* 12.7*  --  7.0 5.1  NEUTROABS 17.9*  --   --   --   --   --   --   HGB 12.1   < > 9.3* 8.4* 8.0* 8.0* 8.4*  HCT 44.5   < > 28.8* 26.8* 25.5* 26.1* 26.2*  MCV 86.7   < > 72.2* 74.4*  --  75.4* 73.0*  PLT 232   < > 115* 107*  --  104* 105*   < > = values in this interval not displayed.    Medications:     Chlorhexidine Gluconate Cloth  6 each Topical Daily   clotrimazole  1 Applicatorful Vaginal QHS   docusate  100 mg Per Tube BID   feeding supplement (PROSource TF20)  60 mL Per Tube Daily   ipratropium-albuterol  3 mL Nebulization Q6H   methylPREDNISolone (SOLU-MEDROL) injection  40 mg Intravenous Q8H   mouth rinse  15 mL Mouth Rinse  Q2H   pantoprazole (PROTONIX) IV  40 mg Intravenous Q12H   polyethylene glycol  17 g Per Tube Daily    Madelon Lips MD 04/15/2022, 10:24 AM

## 2022-04-16 ENCOUNTER — Inpatient Hospital Stay (HOSPITAL_COMMUNITY): Payer: Medicare PPO

## 2022-04-16 DIAGNOSIS — N179 Acute kidney failure, unspecified: Secondary | ICD-10-CM | POA: Diagnosis not present

## 2022-04-16 DIAGNOSIS — R569 Unspecified convulsions: Secondary | ICD-10-CM | POA: Diagnosis not present

## 2022-04-16 DIAGNOSIS — J9601 Acute respiratory failure with hypoxia: Secondary | ICD-10-CM | POA: Diagnosis not present

## 2022-04-16 DIAGNOSIS — I469 Cardiac arrest, cause unspecified: Secondary | ICD-10-CM | POA: Diagnosis not present

## 2022-04-16 LAB — MAGNESIUM: Magnesium: 2.1 mg/dL (ref 1.7–2.4)

## 2022-04-16 LAB — GLUCOSE, CAPILLARY
Glucose-Capillary: 131 mg/dL — ABNORMAL HIGH (ref 70–99)
Glucose-Capillary: 147 mg/dL — ABNORMAL HIGH (ref 70–99)
Glucose-Capillary: 150 mg/dL — ABNORMAL HIGH (ref 70–99)
Glucose-Capillary: 154 mg/dL — ABNORMAL HIGH (ref 70–99)
Glucose-Capillary: 173 mg/dL — ABNORMAL HIGH (ref 70–99)
Glucose-Capillary: 174 mg/dL — ABNORMAL HIGH (ref 70–99)
Glucose-Capillary: 176 mg/dL — ABNORMAL HIGH (ref 70–99)
Glucose-Capillary: 197 mg/dL — ABNORMAL HIGH (ref 70–99)
Glucose-Capillary: 302 mg/dL — ABNORMAL HIGH (ref 70–99)
Glucose-Capillary: 307 mg/dL — ABNORMAL HIGH (ref 70–99)

## 2022-04-16 LAB — BASIC METABOLIC PANEL
Anion gap: 16 — ABNORMAL HIGH (ref 5–15)
BUN: 75 mg/dL — ABNORMAL HIGH (ref 8–23)
CO2: 24 mmol/L (ref 22–32)
Calcium: 9.1 mg/dL (ref 8.9–10.3)
Chloride: 115 mmol/L — ABNORMAL HIGH (ref 98–111)
Creatinine, Ser: 4.7 mg/dL — ABNORMAL HIGH (ref 0.44–1.00)
GFR, Estimated: 9 mL/min — ABNORMAL LOW (ref >60)
Glucose, Bld: 173 mg/dL — ABNORMAL HIGH (ref 70–99)
Potassium: 3.8 mmol/L (ref 3.5–5.1)
Sodium: 155 mmol/L — ABNORMAL HIGH (ref 135–145)

## 2022-04-16 LAB — HEMOGLOBIN A1C
Hgb A1c MFr Bld: >15.5 % — ABNORMAL HIGH (ref 4.8–5.6)
Mean Plasma Glucose: >398 mg/dL

## 2022-04-16 LAB — CBC
HCT: 26 % — ABNORMAL LOW (ref 36.0–46.0)
Hemoglobin: 8.3 g/dL — ABNORMAL LOW (ref 12.0–15.0)
MCH: 23.6 pg — ABNORMAL LOW (ref 26.0–34.0)
MCHC: 31.9 g/dL (ref 30.0–36.0)
MCV: 74.1 fL — ABNORMAL LOW (ref 80.0–100.0)
Platelets: 144 10*3/uL — ABNORMAL LOW (ref 150–400)
RBC: 3.51 MIL/uL — ABNORMAL LOW (ref 3.87–5.11)
RDW: 15.4 % (ref 11.5–15.5)
WBC: 6.7 10*3/uL (ref 4.0–10.5)
nRBC: 1.5 % — ABNORMAL HIGH (ref 0.0–0.2)

## 2022-04-16 LAB — PHOSPHORUS: Phosphorus: 5 mg/dL — ABNORMAL HIGH (ref 2.5–4.6)

## 2022-04-16 LAB — HEPARIN LEVEL (UNFRACTIONATED): Heparin Unfractionated: 0.4 IU/mL (ref 0.30–0.70)

## 2022-04-16 LAB — HEMOGLOBIN AND HEMATOCRIT, BLOOD
HCT: 25.1 % — ABNORMAL LOW (ref 36.0–46.0)
Hemoglobin: 8.2 g/dL — ABNORMAL LOW (ref 12.0–15.0)

## 2022-04-16 MED ORDER — INSULIN GLARGINE-YFGN 100 UNIT/ML ~~LOC~~ SOLN
5.0000 [IU] | Freq: Two times a day (BID) | SUBCUTANEOUS | Status: DC
  Administered 2022-04-16 – 2022-04-17 (×2): 5 [IU] via SUBCUTANEOUS
  Filled 2022-04-16 (×3): qty 0.05

## 2022-04-16 MED ORDER — AMLODIPINE BESYLATE 5 MG PO TABS
5.0000 mg | ORAL_TABLET | Freq: Every day | ORAL | Status: DC
  Administered 2022-04-16 – 2022-04-17 (×2): 5 mg
  Filled 2022-04-16 (×2): qty 1

## 2022-04-16 MED ORDER — DEXTROSE 5 % IV SOLN: INTRAVENOUS | Status: DC

## 2022-04-16 MED ORDER — INSULIN DETEMIR 100 UNIT/ML ~~LOC~~ SOLN
24.0000 [IU] | Freq: Two times a day (BID) | SUBCUTANEOUS | Status: DC
  Administered 2022-04-16: 24 [IU] via SUBCUTANEOUS
  Filled 2022-04-16 (×2): qty 0.24

## 2022-04-16 MED ORDER — INSULIN ASPART 100 UNIT/ML IJ SOLN
2.0000 [IU] | INTRAMUSCULAR | Status: DC
  Administered 2022-04-16: 4 [IU] via SUBCUTANEOUS

## 2022-04-16 MED ORDER — POLYETHYLENE GLYCOL 3350 17 G PO PACK: 17.0000 g | PACK | Freq: Every day | ORAL | Status: DC | PRN

## 2022-04-16 MED ORDER — INSULIN ASPART 100 UNIT/ML IJ SOLN
6.0000 [IU] | INTRAMUSCULAR | Status: DC
  Administered 2022-04-16 – 2022-04-17 (×5): 6 [IU] via SUBCUTANEOUS

## 2022-04-16 MED ORDER — INSULIN ASPART 100 UNIT/ML IJ SOLN
4.0000 [IU] | INTRAMUSCULAR | Status: DC
  Administered 2022-04-16: 4 [IU] via SUBCUTANEOUS

## 2022-04-16 MED ORDER — FREE WATER
200.0000 mL | Status: DC
  Administered 2022-04-16 – 2022-04-17 (×7): 200 mL

## 2022-04-16 MED ORDER — INSULIN ASPART 100 UNIT/ML IJ SOLN
0.0000 [IU] | INTRAMUSCULAR | Status: DC
  Administered 2022-04-16: 3 [IU] via SUBCUTANEOUS
  Administered 2022-04-16 (×2): 11 [IU] via SUBCUTANEOUS
  Administered 2022-04-17: 3 [IU] via SUBCUTANEOUS
  Administered 2022-04-17 (×2): 5 [IU] via SUBCUTANEOUS
  Administered 2022-04-17: 3 [IU] via SUBCUTANEOUS
  Administered 2022-04-18: 2 [IU] via SUBCUTANEOUS
  Administered 2022-04-18: 5 [IU] via SUBCUTANEOUS
  Administered 2022-04-18 (×2): 3 [IU] via SUBCUTANEOUS
  Administered 2022-04-18 – 2022-04-19 (×2): 5 [IU] via SUBCUTANEOUS
  Administered 2022-04-19: 8 [IU] via SUBCUTANEOUS
  Administered 2022-04-19: 3 [IU] via SUBCUTANEOUS
  Administered 2022-04-19: 5 [IU] via SUBCUTANEOUS
  Administered 2022-04-20: 2 [IU] via SUBCUTANEOUS
  Administered 2022-04-20: 5 [IU] via SUBCUTANEOUS

## 2022-04-16 MED ORDER — DOCUSATE SODIUM 50 MG/5ML PO LIQD: 100.0000 mg | Freq: Two times a day (BID) | ORAL | Status: DC | PRN

## 2022-04-16 MED ORDER — PANTOPRAZOLE SODIUM 40 MG IV SOLR
40.0000 mg | Freq: Every day | INTRAVENOUS | Status: DC
  Administered 2022-04-16 – 2022-04-25 (×9): 40 mg via INTRAVENOUS
  Filled 2022-04-16 (×9): qty 10

## 2022-04-16 MED ORDER — DEXTROSE 10 % IV SOLN: INTRAVENOUS | Status: DC | PRN

## 2022-04-16 MED ORDER — INSULIN ASPART 100 UNIT/ML IJ SOLN: 8.0000 [IU] | INTRAMUSCULAR | Status: DC

## 2022-04-16 MED ORDER — INSULIN ASPART 100 UNIT/ML IJ SOLN
10.0000 [IU] | Freq: Once | INTRAMUSCULAR | Status: AC
  Administered 2022-04-16: 10 [IU] via SUBCUTANEOUS

## 2022-04-16 NOTE — Progress Notes (Signed)
PCCM INTERVAL PROGRESS NOTE  RN notes hematuria. On heparin infusion for ? PE/RV clot noted on echo. This has happened before and we needed to hold heparin due to symptomatic anemia.   Hold heparin Check H&H this evening.  ? If [facility] is truly needed If/when we restart will aim for low therapeutic range.    Georgann Housekeeper, AGACNP-BC [place] [facility]  See Amion for personal pager PCCM on call pager [phone] until 7pm. Please call Elink 7p-7a. [phone]  04/16/2022 12:06 PM

## 2022-04-16 NOTE — Progress Notes (Signed)
Inpatient Diabetes Program Recommendations  AACE/ADA: New Consensus Statement on Inpatient Glycemic Control (2015)  Target Ranges:  Prepandial:   less than 140 mg/dL      Peak postprandial:   less than 180 mg/dL (1-2 hours)      Critically ill patients:  140 - 180 mg/dL   Lab Results  Component Value Date   GLUCAP 131 (H) 04/16/2022   HGBA1C >15.5 (H) 04/12/2022    Review of Glycemic Control  Latest Reference Range & Units 04/15/22 23:57 04/16/22 01:24 04/16/22 03:08 04/16/22 05:04 04/16/22 06:35 04/16/22 08:42  Glucose-Capillary 70 - 99 mg/dL 178 (H) 154 (H) 173 (H) 150 (H) 174 (H) 131 (H)   Diabetes history: DM 2 Outpatient Diabetes medications: Jardiance 25 mg daily, Toujeo 100 units daily, Humulin R U500 60 units TID with meals Current orders for Inpatient glycemic control: Note patient transitioning off insulin drip to Levemir 24 units bid, Novolog 4 units q 4 hours, and Novolog 2-6 units q 4 hours.  Inpatient Diabetes Program Recommendations:    Agree with transition.  Patient no longer on steroids.  Will follow.  Thanks,  Adah Perl, RN, BC-ADM Inpatient Diabetes Coordinator Pager [phone]  (8a-5p)

## 2022-04-16 NOTE — Progress Notes (Addendum)
NAME:  Deborah Neal, MRN:  [id number], DOB:  Nov 23, 1950, LOS: 7 ADMISSION DATE:  04/09/2022, CONSULTATION DATE: 12/15 REFERRING MD: Dr. Vanita Panda EDP, CHIEF COMPLAINT: Cardiac arrest  History of Present Illness:  71 year old female with past medical history as below, which is significant for hypertension, hyperlipidemia, type 2 diabetes mellitus, and asthma.  She was in her usual state of health until approximately 5:30 AM on 12/15 when she was seen by granddaughter.  Family attempted to contact her via phone around 7 AM and she did not answer.  When they later went to check on her around 10 AM she was unresponsive in her bed with red emesis seen around her.  Upon arrival to the fire department the patient was unresponsive but did have pulse and respirations. Then suffered seizure.  Upon EMS arrival patient lost pulses and CPR was initiated.   CPR less than 5 minutes.  Patient demonstrated seizure-like activity en route to [facility] emergency department.  Improved with benzodiazepine administration.  Upon arrival to emergency department the patient was intubated.  She remained unresponsive at the time PCCM was asked to admit.  Pertinent  Medical History   has a past medical history of ALLERGIC RHINITIS (01/29/2007), Arthritis, ASTHMA (01/29/2007), ASTHMA, WITH ACUTE EXACERBATION (06/24/2008), BACK PAIN (01/29/2009), CHEST PAIN (06/24/2008), Family history of adverse reaction to anesthesia, GERD (gastroesophageal reflux disease), History of shingles, HYPERLIPIDEMIA (01/29/2007), HYPERTENSION (01/29/2007), OSTEOPENIA (10/16/2007), and Type II or unspecified type diabetes mellitus without mention of complication, uncontrolled (11/29/2013).   Significant Hospital Events: Including procedures, antibiotic start and stop dates in addition to other pertinent events   12/15 admit to  s/p cardiac arrest 12/17 MRI - neg acute, stable moderate atrophy and white matter disease. This likely reflects the sequela of  chronic microvascular ischemia. 12/20-weaning well, no cuff leak. Steroids started.  12/20 not tolerating SBT, rapid rate.   Interim History / Subjective:   Hungry, no other complaints.  No acute events overnight.  S/p 24 hours steroids for no cuff leak yesterday   Objective   Blood pressure (!) 166/84, pulse 79, temperature 99 F (37.2 C), temperature source Bladder, resp. rate (!) 25, height '5\' 3"'$  (1.6 m), weight 78.1 kg, SpO2 98 %.    Vent Mode: PRVC FiO2 (%):  [40 %] 40 % Set Rate:  [24 bmp] 24 bmp Vt Set:  [420 mL] 420 mL PEEP:  [5 cmH20] 5 cmH20 Pressure Support:  [12 cmH20] 12 cmH20 Plateau Pressure:  [23 cmH20-24 cmH20] 23 cmH20   Intake/Output Summary (Last 24 hours) at 04/16/2022 0747 Last data filed at 04/16/2022 0600 Gross per 24 hour  Intake 2461.85 ml  Output 1145 ml  Net 1316.85 ml    Filed Weights   04/13/22 0251 04/15/22 0500 04/16/22 0405  Weight: 78.5 kg 78.5 kg 78.1 kg    Examination:  General: elderly appearing female in NAD HEENT: [place]/AT, PERRL, no JVD Neuro: Alert, oriented, non-focal Chest:  Clear bilateral breath sounds Heart: Tachy, regular, no MRG Abdomen: Soft, NT, ND Skin: Grossly intact   Resolved Hospital Problem list     Assessment & Plan:   Status post out-of-hospital PEA cardiac arrest In hospital for V-fib/V. tach cardiac arrest in the setting of severe hypokalemia Possible massive PE - Did receive TNKase, doppler legs - Completed normothermia protocol - Continue electrolyte repletion  Acute respiratory failure with hypoxia Aspiration pneumonia - 12/20 was ready for extubation, but no cuff leak. Got 24 hours of steroids, but now not weaning  well x 2 days. CXR unchanged. She is autodiuresing. Unclear what has lead to this decline in SBT tolerance.  - CT chest - ABX completed  Seizure-like activity EEG negative for seizures -On Keppra at present -? When we can discontinue. Not a home med. If she did have seizure it was  likely a result of profound metabolic disarray.  Acute kidney injury due to ischemic ATN -Appreciate nephrology -Creatinine finally trending down today. BUN still rising.   Demand ischemia -Post CPR -No wall motion abnormalities on echo  Anemia: hgb stabilized - Concern for GI bleed/retroperitoneal bleed - CT scan negative for retroperitoneal bleed  HTN - hold home losartan d/t kidney failure - Start enteral amlodipine  Hypernatremia - Start free water flushes  Diabetic ketoacidosis -Transition to basal + SSI  Acute metabolic encephalopathy Concern for anoxic injury -She is appropriate in interaction -Wake up assessment seems to be going well -Will attempt to wean and extubate   Best Practice (right click and "Reselect all SmartList Selections" daily)   Diet/type: NPO TF DVT prophylaxis: Systemic heparin GI prophylaxis: Protonix Lines: N/A Foley:  Yes, and it is still needed Code Status:  full code Last date of multidisciplinary goals of care discussion [10/21: Patient's daughter was updated at bedside  Labs   CBC: Recent Labs  Lab 04/09/22 1100 04/09/22 1158 04/12/22 0503 04/13/22 0419 04/13/22 1425 04/14/22 0421 04/15/22 0513 04/16/22 0332  WBC 21.3*   < > 14.9* 12.7*  --  7.0 5.1 6.7  NEUTROABS 17.9*  --   --   --   --   --   --   --   HGB 12.1   < > 9.3* 8.4* 8.0* 8.0* 8.4* 8.3*  HCT 44.5   < > 28.8* 26.8* 25.5* 26.1* 26.2* 26.0*  MCV 86.7   < > 72.2* 74.4*  --  75.4* 73.0* 74.1*  PLT 232   < > 115* 107*  --  104* 105* 144*   < > = values in this interval not displayed.     Basic Metabolic Panel: Recent Labs  Lab 04/10/22 2359 04/12/22 0503 04/13/22 0419 04/14/22 0421 04/15/22 0513 04/16/22 0332  NA 148* 145 145 149* 149* 155*  K 3.3* 4.1 3.1* 3.9 4.4 3.8  CL 110 110 110 114* 115* 115*  CO2 '26 24 23 25 24 24  '$ GLUCOSE 165* 322* 197* 200* 233* 173*  BUN 24* 37* 47* 54* 66* 75*  CREATININE 2.90* 4.41* 5.57* 5.64* 5.48* 4.70*  CALCIUM  8.6* 7.7* 7.6* 8.0* 8.4* 9.1  MG 1.8 1.9 2.0 1.9  --  2.1  PHOS 3.5 6.4* 4.3 4.8*  --  5.0*    GFR: Estimated Creatinine Clearance: 11 mL/min (A) (by C-G formula based on SCr of 4.7 mg/dL (H)). Recent Labs  Lab 04/09/22 1100 04/09/22 1833 04/10/22 0174 04/10/22 1246 04/10/22 2359 04/12/22 0503 04/13/22 0419 04/14/22 0421 04/15/22 0513 04/16/22 0332  WBC 21.3*  --    < >  --  19.3*   < > 12.7* 7.0 5.1 6.7  LATICACIDVEN >9.0* 7.4*  --  4.9* 2.8*  --   --   --   --   --    < > = values in this interval not displayed.     Liver Function Tests: Recent Labs  Lab 04/09/22 1330  AST 51*  ALT 39  ALKPHOS 136*  BILITOT 0.8  PROT 6.1*  ALBUMIN 3.3*    No results for input(s): "LIPASE", "AMYLASE" in the last  168 hours. No results for input(s): "AMMONIA" in the last 168 hours.  ABG    Component Value Date/Time   PHART 7.459 (H) 04/10/2022 1035   PCO2ART 35.1 04/10/2022 1035   PO2ART 68 (L) 04/10/2022 1035   HCO3 24.9 04/10/2022 1035   TCO2 26 04/10/2022 1035   ACIDBASEDEF 4.0 (H) 04/09/2022 1450   O2SAT 94 04/10/2022 1035     Coagulation Profile: Recent Labs  Lab 04/09/22 1100  INR 1.2     Cardiac Enzymes: Recent Labs  Lab 04/09/22 1330  CKTOTAL 190     HbA1C: Hgb A1c MFr Bld  Date/Time Value Ref Range Status  04/12/2022 05:03 AM >15.5 (H) 4.8 - 5.6 % Final    Comment:    (NOTE)         Prediabetes: 5.7 - 6.4         Diabetes: >6.4         Glycemic control for adults with diabetes: <7.0   04/09/2022 06:33 PM >15.5 (H) 4.8 - 5.6 % Final    Comment:    (NOTE)         Prediabetes: 5.7 - 6.4         Diabetes: >6.4         Glycemic control for adults with diabetes: <7.0     CBG: Recent Labs  Lab 04/15/22 2357 04/16/22 0124 04/16/22 0308 04/16/22 0504 04/16/22 0635  GLUCAP 178* 154* 173* 150* 174*   Critical care time: 41 minutes  Georgann Housekeeper, AGACNP-BC [facility]  See Amion for personal pager PCCM on call  pager [phone] until 7pm. Please call Elink 7p-7a. [phone]  04/16/2022 7:47 AM

## 2022-04-16 NOTE — Progress Notes (Signed)
Pt was transported to CT via ventilator with no apparent complications. RT will continue to monitor as needed.

## 2022-04-16 NOTE — Progress Notes (Signed)
[facility] Progress Note Patient Name: Deborah Neal DOB: 1950/05/30 MRN: [id number]   Date of Service  04/16/2022  HPI/Events of Note  Blood sugar 307 mg / dl on current short acting insulin regimen.  eICU Interventions  Lantus Insulin 5 units sq BID,  one time order for Novolog 10 units SQ  x 1 ordered.                                                                                                                            Frederik Pear 04/16/2022, 9:40 PM

## 2022-04-16 NOTE — Progress Notes (Signed)
[facility] Progress Note   Assessment/ Plan:   AKI on presumed CKD- has uncontrolled DM and HTN at baseline so think that probably has some baseline CKD.  Has some oliguria but appears to be picking up from the past few days so hopefully we are seeing some beginning signs of recovery?             - K repleted             - IV Lasix 80 x 1 12/19 robust response, will hold off             - renal US with small kidneys, no hydro  - allow to autodiurese, correct hypernatremia as below  - no need for RRT   2.  Massive PE             - RV clot, got TNK and now on hep gtt   3.  Cardiac arrest: secondary to #2, MRI 12/17 didn't look too bad, now able to respond to questions   4.  DM II: on insulin gtt, per primary   5.  Hypokalemia: repleting  6.  Hypernatremia:   - FWF  - D5 @ 75 x 24 hrs   7.  Dispo: ICU  Subjective:    UOP good, Cr coming down.  + hematuria, hep gtt held per PCCM.      Objective:   BP 130/68   Pulse 84   Temp 99.3 F (37.4 C)   Resp (!) 25   Ht '5\' 3"'$  (1.6 m)   Wt 78.1 kg   SpO2 99%   BMI 30.50 kg/m   Intake/Output Summary (Last 24 hours) at 04/16/2022 1439 Last data filed at 04/16/2022 1400 Gross per 24 hour  Intake 3010.06 ml  Output 2445 ml  Net 565.06 ml   Weight change: -0.4 kg  Physical Exam: GEN ill-appearing, intubated and sedated but able to respond to questions HEENT eyes closed NECK no overt JVD PULM coarse mechanical bilaterally CV RRR ABD soft EXT 1+ LE edema NEURO intubated, sedated, + mitts  Imaging: VAS [place] LOWER EXTREMITY VENOUS (DVT)  Result Date: 04/16/2022  Lower Venous DVT Study Patient Name:  Deborah Neal  Date of Exam:   04/15/2022 Medical Rec #: [id number]     Accession #:    [id number] Date of Birth: 1950-08-23     Patient Gender: F Patient Age:   71 years Exam Location:  [facility] Procedure:      VAS [place] LOWER EXTREMITY VENOUS (DVT) Referring Phys: Georgann Housekeeper  --------------------------------------------------------------------------------  Indications: Pulmonary embolism.  Anticoagulation: Heparin. Comparison Study: No prior studies. Performing Technologist: Darlin Coco RDMS, RVT  Examination Guidelines: A complete evaluation includes B-mode imaging, spectral Doppler, color Doppler, and power Doppler as needed of all accessible portions of each vessel. Bilateral testing is considered an integral part of a complete examination. Limited examinations for reoccurring indications may be performed as noted. The reflux portion of the exam is performed with the patient in reverse Trendelenburg.  +---------+---------------+---------+-----------+----------+--------------+ RIGHT    CompressibilityPhasicitySpontaneityPropertiesThrombus Aging +---------+---------------+---------+-----------+----------+--------------+ CFV      Full           Yes      Yes                                 +---------+---------------+---------+-----------+----------+--------------+ SFJ      Full                                                        +---------+---------------+---------+-----------+----------+--------------+  FV Prox  Full                                                        +---------+---------------+---------+-----------+----------+--------------+ FV Mid   Full                                                        +---------+---------------+---------+-----------+----------+--------------+ FV DistalFull                                                        +---------+---------------+---------+-----------+----------+--------------+ PFV      Full                                                        +---------+---------------+---------+-----------+----------+--------------+ POP      Full           Yes      Yes                                 +---------+---------------+---------+-----------+----------+--------------+ PTV      Full                                                         +---------+---------------+---------+-----------+----------+--------------+ PERO     Full                                                        +---------+---------------+---------+-----------+----------+--------------+   +---------+---------------+---------+-----------+----------+--------------+ LEFT     CompressibilityPhasicitySpontaneityPropertiesThrombus Aging +---------+---------------+---------+-----------+----------+--------------+ CFV      Full           Yes      Yes                                 +---------+---------------+---------+-----------+----------+--------------+ SFJ      Full                                                        +---------+---------------+---------+-----------+----------+--------------+ FV Prox  Full                                                        +---------+---------------+---------+-----------+----------+--------------+  FV Mid   Full                                                        +---------+---------------+---------+-----------+----------+--------------+ FV DistalFull                                                        +---------+---------------+---------+-----------+----------+--------------+ PFV      Full                                                        +---------+---------------+---------+-----------+----------+--------------+ POP      Full           Yes      Yes                                 +---------+---------------+---------+-----------+----------+--------------+ PTV      Full                                                        +---------+---------------+---------+-----------+----------+--------------+ PERO     Full                                                        +---------+---------------+---------+-----------+----------+--------------+     Summary: RIGHT: - There is no evidence of deep vein  thrombosis in the lower extremity.  - A cystic structure is found in the popliteal fossa measuring 5.0 x 1.1 x 2.9 cm.  LEFT: - There is no evidence of deep vein thrombosis in the lower extremity.  - No cystic structure found in the popliteal fossa.  *See table(s) above for measurements and observations. Electronically signed by Deitra Mayo MD on 04/16/2022 at 8:12:49 AM.    Final    DG CHEST PORT 1 VIEW  Result Date: 04/15/2022 CLINICAL DATA:  Hypoxia, respiratory failure EXAM: PORTABLE CHEST 1 VIEW COMPARISON:  04/14/2022 FINDINGS: Endotracheal tube, esophagogastric tube, and right neck vascular catheter remain in unchanged position. Heart size is normal. Bilateral heterogeneous and interstitial airspace opacity, again more conspicuous in the right lung although somewhat improved compared to prior examination. Osseous structures unremarkable. IMPRESSION: 1. Bilateral heterogeneous and interstitial airspace opacity, again more conspicuous in the right lung although somewhat improved compared to prior examination. 2. Support apparatus in unchanged position. Electronically Signed   By: Delanna Ahmadi M.D.   On: 04/15/2022 11:55    Labs: BMET Recent Labs  Lab 04/10/22 4268 04/10/22 1035 04/10/22 2359 04/12/22 0503 04/13/22 0419 04/14/22 0421 04/15/22 0513 04/16/22 0332  NA 150* 151* 148* 145 145 149* 149* 155*  K 2.5* 2.8* 3.3* 4.1  3.1* 3.9 4.4 3.8  CL 111  --  110 110 110 114* 115* 115*  CO2 27  --  '26 24 23 25 24 24  '$ GLUCOSE 414*  --  165* 322* 197* 200* 233* 173*  BUN 22  --  24* 37* 47* 54* 66* 75*  CREATININE 1.85*  --  2.90* 4.41* 5.57* 5.64* 5.48* 4.70*  CALCIUM 9.8  --  8.6* 7.7* 7.6* 8.0* 8.4* 9.1  PHOS <1.0*  --  3.5 6.4* 4.3 4.8*  --  5.0*   CBC Recent Labs  Lab 04/13/22 0419 04/13/22 1425 04/14/22 0421 04/15/22 0513 04/16/22 0332  WBC 12.7*  --  7.0 5.1 6.7  HGB 8.4* 8.0* 8.0* 8.4* 8.3*  HCT 26.8* 25.5* 26.1* 26.2* 26.0*  MCV 74.4*  --  75.4* 73.0* 74.1*  PLT  107*  --  104* 105* 144*    Medications:     amLODipine  5 mg Per Tube Daily   Chlorhexidine Gluconate Cloth  6 each Topical Daily   clotrimazole  1 Applicatorful Vaginal QHS   feeding supplement (PROSource TF20)  60 mL Per Tube Daily   free water  200 mL Per Tube Q4H   insulin aspart  2-6 Units Subcutaneous Q4H   insulin aspart  4 Units Subcutaneous Q4H   insulin detemir  24 Units Subcutaneous Q12H   ipratropium-albuterol  3 mL Nebulization Q6H   mouth rinse  15 mL Mouth Rinse Q2H   pantoprazole (PROTONIX) IV  40 mg Intravenous QHS    Madelon Lips MD 04/16/2022, 2:39 PM

## 2022-04-17 DIAGNOSIS — I469 Cardiac arrest, cause unspecified: Secondary | ICD-10-CM | POA: Diagnosis not present

## 2022-04-17 LAB — BASIC METABOLIC PANEL
Anion gap: 9 (ref 5–15)
BUN: 68 mg/dL — ABNORMAL HIGH (ref 8–23)
CO2: 26 mmol/L (ref 22–32)
Calcium: 8.6 mg/dL — ABNORMAL LOW (ref 8.9–10.3)
Chloride: 119 mmol/L — ABNORMAL HIGH (ref 98–111)
Creatinine, Ser: 3.43 mg/dL — ABNORMAL HIGH (ref 0.44–1.00)
GFR, Estimated: 14 mL/min — ABNORMAL LOW (ref >60)
Glucose, Bld: 169 mg/dL — ABNORMAL HIGH (ref 70–99)
Potassium: 3 mmol/L — ABNORMAL LOW (ref 3.5–5.1)
Sodium: 154 mmol/L — ABNORMAL HIGH (ref 135–145)

## 2022-04-17 LAB — GLUCOSE, CAPILLARY
Glucose-Capillary: 264 mg/dL — ABNORMAL HIGH (ref 70–99)
Glucose-Capillary: 155 mg/dL — ABNORMAL HIGH (ref 70–99)
Glucose-Capillary: 158 mg/dL — ABNORMAL HIGH (ref 70–99)
Glucose-Capillary: 229 mg/dL — ABNORMAL HIGH (ref 70–99)
Glucose-Capillary: 79 mg/dL (ref 70–99)
Glucose-Capillary: 212 mg/dL — ABNORMAL HIGH (ref 70–99)
Glucose-Capillary: 108 mg/dL — ABNORMAL HIGH (ref 70–99)

## 2022-04-17 LAB — CBC
HCT: 25.2 % — ABNORMAL LOW (ref 36.0–46.0)
Hemoglobin: 7.8 g/dL — ABNORMAL LOW (ref 12.0–15.0)
MCH: 23.4 pg — ABNORMAL LOW (ref 26.0–34.0)
MCHC: 31 g/dL (ref 30.0–36.0)
MCV: 75.4 fL — ABNORMAL LOW (ref 80.0–100.0)
Platelets: 190 10*3/uL (ref 150–400)
RBC: 3.34 MIL/uL — ABNORMAL LOW (ref 3.87–5.11)
RDW: 15 % (ref 11.5–15.5)
WBC: 8.8 10*3/uL (ref 4.0–10.5)
nRBC: 1 % — ABNORMAL HIGH (ref 0.0–0.2)

## 2022-04-17 LAB — PHOSPHORUS: Phosphorus: 4.5 mg/dL (ref 2.5–4.6)

## 2022-04-17 LAB — MAGNESIUM: Magnesium: 1.9 mg/dL (ref 1.7–2.4)

## 2022-04-17 LAB — HEPARIN LEVEL (UNFRACTIONATED): Heparin Unfractionated: <0.1 IU/mL — ABNORMAL LOW (ref 0.30–0.70)

## 2022-04-17 MED ORDER — ALBUTEROL SULFATE (2.5 MG/3ML) 0.083% IN NEBU
2.5000 mg | INHALATION_SOLUTION | RESPIRATORY_TRACT | Status: DC | PRN
  Administered 2022-04-17 – 2022-04-20 (×3): 2.5 mg via RESPIRATORY_TRACT
  Filled 2022-04-17: qty 3

## 2022-04-17 MED ORDER — FREE WATER: 300.0000 mL | Status: DC

## 2022-04-17 MED ORDER — ALBUTEROL SULFATE (2.5 MG/3ML) 0.083% IN NEBU
INHALATION_SOLUTION | RESPIRATORY_TRACT | Status: AC
  Filled 2022-04-17: qty 3

## 2022-04-17 MED ORDER — INSULIN GLARGINE-YFGN 100 UNIT/ML ~~LOC~~ SOLN
10.0000 [IU] | Freq: Two times a day (BID) | SUBCUTANEOUS | Status: DC
  Filled 2022-04-17 (×2): qty 0.1

## 2022-04-17 MED ORDER — DEXTROSE 5 % IV SOLN: INTRAVENOUS | Status: DC

## 2022-04-17 MED ORDER — ORAL CARE MOUTH RINSE: 15.0000 mL | OROMUCOSAL | Status: DC | PRN

## 2022-04-17 MED ORDER — POTASSIUM CHLORIDE 20 MEQ PO PACK
40.0000 meq | PACK | Freq: Once | ORAL | Status: AC
  Administered 2022-04-17: 40 meq
  Filled 2022-04-17: qty 2

## 2022-04-17 MED ORDER — SODIUM CHLORIDE 0.9 % IV SOLN
2.0000 g | INTRAVENOUS | Status: DC
  Administered 2022-04-17 – 2022-04-19 (×3): 2 g via INTRAVENOUS
  Filled 2022-04-17 (×3): qty 12.5

## 2022-04-17 NOTE — Progress Notes (Signed)
NAME:  Deborah Neal, MRN:  [id number], DOB:  10-08-1950, LOS: 33 ADMISSION DATE:  04/09/2022, CONSULTATION DATE: 12/15 REFERRING MD: Dr. Vanita Panda EDP, CHIEF COMPLAINT: Cardiac arrest  History of Present Illness:  71 year old female with past medical history as below, which is significant for hypertension, hyperlipidemia, type 2 diabetes mellitus, and asthma.  She was in her usual state of health until approximately 5:30 AM on 12/15 when she was seen by granddaughter.  Family attempted to contact her via phone around 7 AM and she did not answer.  When they later went to check on her around 10 AM she was unresponsive in her bed with red emesis seen around her.  Upon arrival to the fire department the patient was unresponsive but did have pulse and respirations. Then suffered seizure.  Upon EMS arrival patient lost pulses and CPR was initiated.   CPR less than 5 minutes.  Patient demonstrated seizure-like activity en route to [facility] emergency department.  Improved with benzodiazepine administration.  Upon arrival to emergency department the patient was intubated.  She remained unresponsive at the time PCCM was asked to admit.  Pertinent  Medical History   has a past medical history of ALLERGIC RHINITIS (01/29/2007), Arthritis, ASTHMA (01/29/2007), ASTHMA, WITH ACUTE EXACERBATION (06/24/2008), BACK PAIN (01/29/2009), CHEST PAIN (06/24/2008), Family history of adverse reaction to anesthesia, GERD (gastroesophageal reflux disease), History of shingles, HYPERLIPIDEMIA (01/29/2007), HYPERTENSION (01/29/2007), OSTEOPENIA (10/16/2007), and Type II or unspecified type diabetes mellitus without mention of complication, uncontrolled (11/29/2013).   Significant Hospital Events: Including procedures, antibiotic start and stop dates in addition to other pertinent events   12/15 admit to  s/p cardiac arrest 12/17 MRI - neg acute, stable moderate atrophy and white matter disease. This likely reflects the sequela of  chronic microvascular ischemia. 12/20-weaning well, no cuff leak. Steroids started.  12/22 - marginal on SBT. CT chest with LUL atx, patchy multifocal infiltrates, and small bilateral effusions  Interim History / Subjective:   No complaints. Tolerating SBT.   Objective   Blood pressure 120/67, pulse 68, temperature 98.2 F (36.8 C), temperature source Bladder, resp. rate (!) 24, height '5\' 3"'$  (1.6 m), weight 76.2 kg, SpO2 99 %.    Vent Mode: PRVC FiO2 (%):  [40 %] 40 % Set Rate:  [24 bmp] 24 bmp Vt Set:  [420 mL] 420 mL PEEP:  [5 cmH20] 5 cmH20 Pressure Support:  [5 cmH20] 5 cmH20 Plateau Pressure:  [20 cmH20] 20 cmH20   Intake/Output Summary (Last 24 hours) at 04/17/2022 0918 Last data filed at 04/17/2022 0814 Gross per 24 hour  Intake 3285.85 ml  Output 2535 ml  Net 750.85 ml    Filed Weights   04/15/22 0500 04/16/22 0405 04/17/22 0339  Weight: 78.5 kg 78.1 kg 76.2 kg    Examination:  General: Elderly female resting comfortably in bed HEENT: [place]/AT, PERRL , no JVD. ETT Neuro: Alert, oriented, non-focal Chest:  Clear bilateral breath sounds.  Heart: RRR, no MRG Abdomen: Soft, NT, ND Skin: Grossly intact.   Resolved Hospital Problem list     Assessment & Plan:   Status post out-of-hospital PEA cardiac arrest In hospital for V-fib/V. tach cardiac arrest in the setting of severe hypokalemia Possible massive PE: Mobile RV clot noted on echo, but not well visualized. TNK given during code just after echo was read. Has had ongoing bleeding issues (coffee ground NGT output, hematuria, HGB drops) requiring heparin to be held. Dopplers negative. Can't CT due to renal function. V/Q  low yield in the setting of multifocal infiltrates. - Did receive TNKase, doppler legs - Completed normothermia protocol - Continue electrolyte repletion  Acute respiratory failure with hypoxia Aspiration pneumonia: antibiotic course completed. - Tolerating SBT, Will plan to extubate. Can  support with PRN BiPAP if necessary, but I don't think it will be.   Seizure-like activity EEG negative for seizures -On Keppra at present - Will plan to discontinue once she is more stable. Not a home med. If she did have seizure it was likely a result of profound metabolic disarray. Don't think she needs this long term.   Acute kidney injury due to ischemic ATN - Appreciate nephrology - 3.5L UOPx 24 hours. BUN and Creatinine trending down. Seems like she will avoid RRT.  - Allowing to autodiurese  Anemia: hgb stabilized - Concern for GI bleed/retroperitoneal bleed - CT scan negative for retroperitoneal bleed - Holding heparin due to ongoing bleeding issues  HTN - hold home losartan d/t kidney failure - Start enteral amlodipine  Hypernatremia - Start free water flushes  Diabetic ketoacidosis -Drip transitioned on 12/22 - Basal + SSI + TF coverage  Acute metabolic encephalopathy: markedly improved. -Supportive care   Best Practice (right click and "Reselect all SmartList Selections" daily)   Diet/type: NPO TF DVT prophylaxis: Systemic heparin GI prophylaxis: Protonix Lines: N/A Foley:  Yes, and it is still needed Code Status:  full code Last date of multidisciplinary goals of care discussion [10/21: Patient's daughter was updated at bedside  Labs   CBC: Recent Labs  Lab 04/13/22 0419 04/13/22 1425 04/14/22 0421 04/15/22 0513 04/16/22 0332 04/16/22 1433 04/17/22 0339  WBC 12.7*  --  7.0 5.1 6.7  --  8.8  HGB 8.4*   < > 8.0* 8.4* 8.3* 8.2* 7.8*  HCT 26.8*   < > 26.1* 26.2* 26.0* 25.1* 25.2*  MCV 74.4*  --  75.4* 73.0* 74.1*  --  75.4*  PLT 107*  --  104* 105* 144*  --  190   < > = values in this interval not displayed.     Basic Metabolic Panel: Recent Labs  Lab 04/12/22 0503 04/13/22 0419 04/14/22 0421 04/15/22 0513 04/16/22 0332 04/17/22 0339  NA 145 145 149* 149* 155* 154*  K 4.1 3.1* 3.9 4.4 3.8 3.0*  CL 110 110 114* 115* 115* 119*  CO2 '24  23 25 24 24 26  '$ GLUCOSE 322* 197* 200* 233* 173* 169*  BUN 37* 47* 54* 66* 75* 68*  CREATININE 4.41* 5.57* 5.64* 5.48* 4.70* 3.43*  CALCIUM 7.7* 7.6* 8.0* 8.4* 9.1 8.6*  MG 1.9 2.0 1.9  --  2.1 1.9  PHOS 6.4* 4.3 4.8*  --  5.0* 4.5    GFR: Estimated Creatinine Clearance: 14.9 mL/min (A) (by C-G formula based on SCr of 3.43 mg/dL (H)). Recent Labs  Lab 04/10/22 1246 04/10/22 2359 04/12/22 0503 04/14/22 0421 04/15/22 0513 04/16/22 0332 04/17/22 0339  WBC  --  19.3*   < > 7.0 5.1 6.7 8.8  LATICACIDVEN 4.9* 2.8*  --   --   --   --   --    < > = values in this interval not displayed.     Liver Function Tests: No results for input(s): "AST", "ALT", "ALKPHOS", "BILITOT", "PROT", "ALBUMIN" in the last 168 hours.  No results for input(s): "LIPASE", "AMYLASE" in the last 168 hours. No results for input(s): "AMMONIA" in the last 168 hours.  ABG    Component Value Date/Time   PHART 7.459 (H)  04/10/2022 1035   PCO2ART 35.1 04/10/2022 1035   PO2ART 68 (L) 04/10/2022 1035   HCO3 24.9 04/10/2022 1035   TCO2 26 04/10/2022 1035   ACIDBASEDEF 4.0 (H) 04/09/2022 1450   O2SAT 94 04/10/2022 1035     Coagulation Profile: No results for input(s): "INR", "PROTIME" in the last 168 hours.   Cardiac Enzymes: No results for input(s): "CKTOTAL", "CKMB", "CKMBINDEX", "TROPONINI" in the last 168 hours.   HbA1C: Hgb A1c MFr Bld  Date/Time Value Ref Range Status  04/12/2022 05:03 AM >15.5 (H) 4.8 - 5.6 % Final    Comment:    (NOTE)         Prediabetes: 5.7 - 6.4         Diabetes: >6.4         Glycemic control for adults with diabetes: <7.0   04/09/2022 06:33 PM >15.5 (H) 4.8 - 5.6 % Final    Comment:    (NOTE)         Prediabetes: 5.7 - 6.4         Diabetes: >6.4         Glycemic control for adults with diabetes: <7.0     CBG: Recent Labs  Lab 04/16/22 1558 04/16/22 1934 04/16/22 2330 04/17/22 0336 04/17/22 0732  GLUCAP 264* 307* 302* 155* 158*   Critical care time: 46  minutes  Georgann Housekeeper, AGACNP-BC [facility]  See Amion for personal pager PCCM on call pager [phone] until 7pm. Please call Elink 7p-7a. [phone]  04/17/2022 9:18 AM

## 2022-04-17 NOTE — Progress Notes (Signed)
[facility] Progress Note   Assessment/ Plan:   AKI on presumed CKD- has uncontrolled DM and HTN at baseline so think that probably has some baseline CKD.  Has some oliguria but appears to be picking up from the past few days so hopefully we are seeing some beginning signs of recovery?             - K repleted             - IV Lasix 80 x 1 12/19 robust response, will hold off             - renal US with small kidneys, no hydro  - allow to autodiurese, correct hypernatremia as below  - no need for RRT  - will sign off for now- please call with questions   2.  Massive PE             - RV clot, got TNK and now on hep gtt  - CT without contrast 12/22 showing possible bilateral PNA, effusions, dense consolidation/ atalectasis LUL   3.  Cardiac arrest: secondary to #2, MRI 12/17 didn't look too bad, AAO x 3 now   4.  DM II: on insulin gtt, per primary   5.  Hypokalemia: repleted  6.  Hypernatremia:   - FWF  - D5 @ 75 x 24 hrs   7.  Dispo: ICU  Subjective:    Extubated this AM.  Cr coming down.  D5 held d/t sugars last night, restarted now.     Objective:   BP 124/75 (BP Location: Left Arm)   Pulse 89   Temp 98.2 F (36.8 C) (Bladder)   Resp (!) 23   Ht '5\' 3"'$  (1.6 m)   Wt 76.2 kg   SpO2 96%   BMI 29.76 kg/m   Intake/Output Summary (Last 24 hours) at 04/17/2022 1052 Last data filed at 04/17/2022 1023 Gross per 24 hour  Intake 3571.57 ml  Output 2535 ml  Net 1036.57 ml   Weight change: -1.9 kg  Physical Exam: GEN extubated, strong cough HEENT eyes closed NECK no overt JVD PULM coarse mechanical bilaterally CV RRR ABD soft EXT no LE edema.   NEURO intubated, sedated, + mitts  Imaging: CT CHEST WO CONTRAST  Result Date: 04/16/2022 CLINICAL DATA:  Chronic dyspnea EXAM: CT CHEST WITHOUT CONTRAST TECHNIQUE: Multidetector CT imaging of the chest was performed following the standard protocol without IV contrast. RADIATION DOSE REDUCTION: This exam was  performed according to the departmental dose-optimization program which includes automated exposure control, adjustment of the mA and/or kV according to patient size and/or use of iterative reconstruction technique. COMPARISON:  Previous studies including CT done on 07/10/2018 and chest radiograph done on 04/15/2022 FINDINGS: Cardiovascular: Coronary artery calcifications are seen. Heart is enlarged in size. There are coarse calcifications in thoracic aorta. Small pericardial effusion is seen. There is ectasia of main pulmonary artery measuring 3.6 cm suggesting possible pulmonary arterial hypertension. Mediastinum/Nodes: Tip of endotracheal tube is 2.6 cm above the carina. Enteric tube is noted with its tip in the stomach. Lungs/Pleura: There is 8.5 x 2.9 cm soft tissue density in the medial left upper lung field. There is occlusion of left upper lobe bronchus in the left hilar region. Similar findings were seen in previous CT done on 07/10/2018. However, atelectasis near the left hilum appears slightly more prominent in size in comparison with the previous examination. There are patchy infiltrates in both lungs, more so in right upper  lobe. There other smaller patchy infiltrates in right middle lobe, both lower lobes and left upper lobe. Left lung is smaller than right. Small to moderate bilateral pleural effusions are seen, larger on the left side. There is some loculation of effusion in the left apex. There is no pneumothorax. Upper Abdomen: Surgical clips are seen in gallbladder fossa. Musculoskeletal: No acute findings are seen. IMPRESSION: Patchy alveolar infiltrates are seen in both lungs, more so in right upper lobe suggesting multifocal pneumonia. Small to moderate bilateral pleural effusions, more so on the left side which may suggest parapneumonic effusion or empyema. There is dense atelectasis in left upper lobe with decreased volume in the left lung. This may suggest chronic obstruction of left upper  lobe bronchus. Similar atelectatic changes were noted in left upper lobe in the previous examination done on 07/10/2018. Short-term follow-up CT in 3 months may be considered to assess stability. Other options would include endoscopy or PET-CT to rule out any obstructive slow-growing neoplastic process in or around the left upper lobe bronchus. There is ectasia of main pulmonary artery suggesting possible pulmonary arterial hypertension. Extensive coronary artery calcifications are seen. Small pericardial effusion is seen. Electronically Signed   By: Elmer Picker M.D.   On: 04/16/2022 19:47   VAS [place] LOWER EXTREMITY VENOUS (DVT)  Result Date: 04/16/2022  Lower Venous DVT Study Patient Name:  Deborah Neal  Date of Exam:   04/15/2022 Medical Rec #: [id number]     Accession #:    [id number] Date of Birth: 12-Mar-1951     Patient Gender: F Patient Age:   71 years Exam [facility]:  [facility] Procedure:      VAS [place] LOWER EXTREMITY VENOUS (DVT) Referring Phys: Georgann Housekeeper --------------------------------------------------------------------------------  Indications: Pulmonary embolism.  Anticoagulation: Heparin. Comparison Study: No prior studies. Performing Technologist: Darlin Coco RDMS, RVT  Examination Guidelines: A complete evaluation includes B-mode imaging, spectral Doppler, color Doppler, and power Doppler as needed of all accessible portions of each vessel. Bilateral testing is considered an integral part of a complete examination. Limited examinations for reoccurring indications may be performed as noted. The reflux portion of the exam is performed with the patient in reverse Trendelenburg.  +---------+---------------+---------+-----------+----------+--------------+ RIGHT    CompressibilityPhasicitySpontaneityPropertiesThrombus Aging +---------+---------------+---------+-----------+----------+--------------+ CFV      Full           Yes      Yes                                  +---------+---------------+---------+-----------+----------+--------------+ SFJ      Full                                                        +---------+---------------+---------+-----------+----------+--------------+ FV Prox  Full                                                        +---------+---------------+---------+-----------+----------+--------------+ FV Mid   Full                                                        +---------+---------------+---------+-----------+----------+--------------+  FV DistalFull                                                        +---------+---------------+---------+-----------+----------+--------------+ PFV      Full                                                        +---------+---------------+---------+-----------+----------+--------------+ POP      Full           Yes      Yes                                 +---------+---------------+---------+-----------+----------+--------------+ PTV      Full                                                        +---------+---------------+---------+-----------+----------+--------------+ PERO     Full                                                        +---------+---------------+---------+-----------+----------+--------------+   +---------+---------------+---------+-----------+----------+--------------+ LEFT     CompressibilityPhasicitySpontaneityPropertiesThrombus Aging +---------+---------------+---------+-----------+----------+--------------+ CFV      Full           Yes      Yes                                 +---------+---------------+---------+-----------+----------+--------------+ SFJ      Full                                                        +---------+---------------+---------+-----------+----------+--------------+ FV Prox  Full                                                         +---------+---------------+---------+-----------+----------+--------------+ FV Mid   Full                                                        +---------+---------------+---------+-----------+----------+--------------+ FV DistalFull                                                        +---------+---------------+---------+-----------+----------+--------------+   PFV      Full                                                        +---------+---------------+---------+-----------+----------+--------------+ POP      Full           Yes      Yes                                 +---------+---------------+---------+-----------+----------+--------------+ PTV      Full                                                        +---------+---------------+---------+-----------+----------+--------------+ PERO     Full                                                        +---------+---------------+---------+-----------+----------+--------------+     Summary: RIGHT: - There is no evidence of deep vein thrombosis in the lower extremity.  - A cystic structure is found in the popliteal fossa measuring 5.0 x 1.1 x 2.9 cm.  LEFT: - There is no evidence of deep vein thrombosis in the lower extremity.  - No cystic structure found in the popliteal fossa.  *See table(s) above for measurements and observations. Electronically signed by Deitra Mayo MD on 04/16/2022 at 8:12:49 AM.    Final    DG CHEST PORT 1 VIEW  Result Date: 04/15/2022 CLINICAL DATA:  Hypoxia, respiratory failure EXAM: PORTABLE CHEST 1 VIEW COMPARISON:  04/14/2022 FINDINGS: Endotracheal tube, esophagogastric tube, and right neck vascular catheter remain in unchanged position. Heart size is normal. Bilateral heterogeneous and interstitial airspace opacity, again more conspicuous in the right lung although somewhat improved compared to prior examination. Osseous structures unremarkable. IMPRESSION: 1. Bilateral  heterogeneous and interstitial airspace opacity, again more conspicuous in the right lung although somewhat improved compared to prior examination. 2. Support apparatus in unchanged position. Electronically Signed   By: Delanna Ahmadi M.D.   On: 04/15/2022 11:55    Labs: BMET Recent Labs  Lab 04/10/22 2359 04/12/22 0503 04/13/22 0419 04/14/22 0421 04/15/22 0513 04/16/22 0332 04/17/22 0339  NA 148* 145 145 149* 149* 155* 154*  K 3.3* 4.1 3.1* 3.9 4.4 3.8 3.0*  CL 110 110 110 114* 115* 115* 119*  CO2 '26 24 23 25 24 24 26  '$ GLUCOSE 165* 322* 197* 200* 233* 173* 169*  BUN 24* 37* 47* 54* 66* 75* 68*  CREATININE 2.90* 4.41* 5.57* 5.64* 5.48* 4.70* 3.43*  CALCIUM 8.6* 7.7* 7.6* 8.0* 8.4* 9.1 8.6*  PHOS 3.5 6.4* 4.3 4.8*  --  5.0* 4.5   CBC Recent Labs  Lab 04/14/22 0421 04/15/22 0513 04/16/22 0332 04/16/22 1433 04/17/22 0339  WBC 7.0 5.1 6.7  --  8.8  HGB 8.0* 8.4* 8.3* 8.2* 7.8*  HCT 26.1* 26.2* 26.0* 25.1* 25.2*  MCV 75.4* 73.0* 74.1*  --  75.4*  PLT 104* 105* 144*  --  190    Medications:     amLODipine  5 mg Per Tube Daily   Chlorhexidine Gluconate Cloth  6 each Topical Daily   clotrimazole  1 Applicatorful Vaginal QHS   feeding supplement (PROSource TF20)  60 mL Per Tube Daily   free water  300 mL Per Tube Q4H   insulin aspart  0-15 Units Subcutaneous Q4H   insulin glargine-yfgn  5 Units Subcutaneous BID   ipratropium-albuterol  3 mL Nebulization Q6H   mouth rinse  15 mL Mouth Rinse Q2H   pantoprazole (PROTONIX) IV  40 mg Intravenous QHS    Madelon Lips MD 04/17/2022, 10:52 AM

## 2022-04-17 NOTE — Progress Notes (Signed)
[facility] Progress Note Patient Name: NILE DORNING DOB: July 04, 1950 MRN: [id number]   Date of Service  04/17/2022  HPI/Events of Note  K+ 3.0, Creatinine 3.43.  eICU Interventions  KCL 40 meq via OG tube x 1 dose.        Kerry Kass Ellieana Dolecki 04/17/2022, 4:54 AM

## 2022-04-17 NOTE — Procedures (Signed)
Extubation Procedure Note  Patient Details:   Name: Deborah Neal DOB: 04/19/1951 MRN: [id number]   Airway Documentation:    Vent end date: 04/17/22 Vent end time: 0940   Evaluation  O2 sats: stable throughout Complications: No apparent complications Patient did tolerate procedure well. Bilateral Breath Sounds: Diminished, Clear   Yes Pt was successfully extubated to [place] with no apparent complications. No audible cuffleak was heard but loss of Vte was noted. No signs of stridor at this time. RT will monitor as needed. Felecia Jan 04/17/2022, 9:48 AM

## 2022-04-18 DIAGNOSIS — I469 Cardiac arrest, cause unspecified: Secondary | ICD-10-CM | POA: Diagnosis not present

## 2022-04-18 LAB — BASIC METABOLIC PANEL
Anion gap: 12 (ref 5–15)
Anion gap: 11 (ref 5–15)
BUN: 56 mg/dL — ABNORMAL HIGH (ref 8–23)
BUN: 52 mg/dL — ABNORMAL HIGH (ref 8–23)
CO2: 23 mmol/L (ref 22–32)
CO2: 25 mmol/L (ref 22–32)
Calcium: 8.9 mg/dL (ref 8.9–10.3)
Calcium: 8.5 mg/dL — ABNORMAL LOW (ref 8.9–10.3)
Chloride: 113 mmol/L — ABNORMAL HIGH (ref 98–111)
Chloride: 111 mmol/L (ref 98–111)
Creatinine, Ser: 2.47 mg/dL — ABNORMAL HIGH (ref 0.44–1.00)
Creatinine, Ser: 2.27 mg/dL — ABNORMAL HIGH (ref 0.44–1.00)
GFR, Estimated: 20 mL/min — ABNORMAL LOW (ref >60)
GFR, Estimated: 23 mL/min — ABNORMAL LOW (ref >60)
Glucose, Bld: 129 mg/dL — ABNORMAL HIGH (ref 70–99)
Glucose, Bld: 183 mg/dL — ABNORMAL HIGH (ref 70–99)
Potassium: 3.1 mmol/L — ABNORMAL LOW (ref 3.5–5.1)
Potassium: 3.7 mmol/L (ref 3.5–5.1)
Sodium: 148 mmol/L — ABNORMAL HIGH (ref 135–145)
Sodium: 147 mmol/L — ABNORMAL HIGH (ref 135–145)

## 2022-04-18 LAB — CBC
HCT: 26.8 % — ABNORMAL LOW (ref 36.0–46.0)
Hemoglobin: 8.5 g/dL — ABNORMAL LOW (ref 12.0–15.0)
MCH: 23.6 pg — ABNORMAL LOW (ref 26.0–34.0)
MCHC: 31.7 g/dL (ref 30.0–36.0)
MCV: 74.4 fL — ABNORMAL LOW (ref 80.0–100.0)
Platelets: 235 10*3/uL (ref 150–400)
RBC: 3.6 MIL/uL — ABNORMAL LOW (ref 3.87–5.11)
RDW: 15.1 % (ref 11.5–15.5)
WBC: 14.1 10*3/uL — ABNORMAL HIGH (ref 4.0–10.5)
nRBC: 0.8 % — ABNORMAL HIGH (ref 0.0–0.2)

## 2022-04-18 LAB — GLUCOSE, CAPILLARY
Glucose-Capillary: 127 mg/dL — ABNORMAL HIGH (ref 70–99)
Glucose-Capillary: 109 mg/dL — ABNORMAL HIGH (ref 70–99)
Glucose-Capillary: 210 mg/dL — ABNORMAL HIGH (ref 70–99)
Glucose-Capillary: 151 mg/dL — ABNORMAL HIGH (ref 70–99)
Glucose-Capillary: 190 mg/dL — ABNORMAL HIGH (ref 70–99)
Glucose-Capillary: 248 mg/dL — ABNORMAL HIGH (ref 70–99)

## 2022-04-18 LAB — POCT I-STAT 7, (LYTES, BLD GAS, ICA,H+H)
Acid-Base Excess: 1 mmol/L (ref 0.0–2.0)
Bicarbonate: 25.2 mmol/L (ref 20.0–28.0)
Calcium, Ion: 1.22 mmol/L (ref 1.15–1.40)
HCT: 26 % — ABNORMAL LOW (ref 36.0–46.0)
Hemoglobin: 8.8 g/dL — ABNORMAL LOW (ref 12.0–15.0)
O2 Saturation: 93 %
Potassium: 3.8 mmol/L (ref 3.5–5.1)
Sodium: 147 mmol/L — ABNORMAL HIGH (ref 135–145)
TCO2: 26 mmol/L (ref 22–32)
pCO2 arterial: 38.8 mmHg (ref 32–48)
pH, Arterial: 7.42 (ref 7.35–7.45)
pO2, Arterial: 66 mmHg — ABNORMAL LOW (ref 83–108)

## 2022-04-18 LAB — PHOSPHORUS: Phosphorus: 4.7 mg/dL — ABNORMAL HIGH (ref 2.5–4.6)

## 2022-04-18 LAB — CULTURE, RESPIRATORY W GRAM STAIN

## 2022-04-18 LAB — MAGNESIUM: Magnesium: 1.5 mg/dL — ABNORMAL LOW (ref 1.7–2.4)

## 2022-04-18 LAB — HEPARIN LEVEL (UNFRACTIONATED): Heparin Unfractionated: 0.2 IU/mL — ABNORMAL LOW (ref 0.30–0.70)

## 2022-04-18 MED ORDER — HEPARIN (PORCINE) 25000 UT/250ML-% IV SOLN
1100.0000 [IU]/h | INTRAVENOUS | Status: DC
  Administered 2022-04-18: 950 [IU]/h via INTRAVENOUS
  Administered 2022-04-19: 1100 [IU]/h via INTRAVENOUS
  Filled 2022-04-18 (×2): qty 250

## 2022-04-18 MED ORDER — MAGNESIUM SULFATE 2 GM/50ML IV SOLN
2.0000 g | Freq: Once | INTRAVENOUS | Status: AC
  Administered 2022-04-18: 2 g via INTRAVENOUS
  Filled 2022-04-18: qty 50

## 2022-04-18 MED ORDER — POTASSIUM CHLORIDE CRYS ER 20 MEQ PO TBCR: 40.0000 meq | EXTENDED_RELEASE_TABLET | Freq: Two times a day (BID) | ORAL | Status: DC

## 2022-04-18 MED ORDER — INSULIN GLARGINE-YFGN 100 UNIT/ML ~~LOC~~ SOLN
5.0000 [IU] | Freq: Two times a day (BID) | SUBCUTANEOUS | Status: AC
  Administered 2022-04-18 – 2022-04-21 (×7): 5 [IU] via SUBCUTANEOUS
  Filled 2022-04-18 (×7): qty 0.05

## 2022-04-18 MED ORDER — LEVETIRACETAM 500 MG PO TABS: 500.0000 mg | ORAL_TABLET | Freq: Two times a day (BID) | ORAL | Status: DC

## 2022-04-18 MED ORDER — AMLODIPINE BESYLATE 5 MG PO TABS
5.0000 mg | ORAL_TABLET | Freq: Every day | ORAL | Status: DC
  Administered 2022-04-18 – 2022-04-19 (×2): 5 mg via ORAL
  Filled 2022-04-18 (×2): qty 1

## 2022-04-18 MED ORDER — POTASSIUM CHLORIDE CRYS ER 20 MEQ PO TBCR
40.0000 meq | EXTENDED_RELEASE_TABLET | Freq: Two times a day (BID) | ORAL | Status: AC
  Administered 2022-04-18 (×2): 40 meq via ORAL
  Filled 2022-04-18 (×2): qty 2

## 2022-04-18 MED ORDER — POTASSIUM CHLORIDE CRYS ER 20 MEQ PO TBCR
30.0000 meq | EXTENDED_RELEASE_TABLET | ORAL | Status: DC
  Filled 2022-04-18: qty 1

## 2022-04-18 MED ORDER — LEVETIRACETAM IN NACL 500 MG/100ML IV SOLN
500.0000 mg | Freq: Two times a day (BID) | INTRAVENOUS | Status: DC
  Administered 2022-04-18: 500 mg via INTRAVENOUS
  Filled 2022-04-18: qty 100

## 2022-04-18 MED ORDER — FUROSEMIDE 10 MG/ML IJ SOLN
40.0000 mg | Freq: Once | INTRAMUSCULAR | Status: AC
  Administered 2022-04-18: 40 mg via INTRAVENOUS
  Filled 2022-04-18: qty 4

## 2022-04-18 MED ORDER — POTASSIUM CHLORIDE 10 MEQ/100ML IV SOLN
10.0000 meq | INTRAVENOUS | Status: AC
  Administered 2022-04-18 (×4): 10 meq via INTRAVENOUS
  Filled 2022-04-18 (×3): qty 100

## 2022-04-18 NOTE — Progress Notes (Signed)
[facility] Progress Note Patient Name: MARCHETA HORSEY DOB: 1950-11-23 MRN: [id number]   Date of Service  04/18/2022  HPI/Events of Note  Hypokalemia  Hypomagnesemia - K+ = 3.1, Mg++ = 1.5 and Creatinine = 2.47.  eICU Interventions  Will replace K+ and Mg++.     Intervention Category Major Interventions: Electrolyte abnormality - evaluation and management  Garv Kuechle Eugene 04/18/2022, 4:12 AM

## 2022-04-18 NOTE — Progress Notes (Addendum)
NAME:  Deborah Neal, MRN:  [id number], DOB:  17-Sep-1950, LOS: 9 ADMISSION DATE:  04/09/2022, CONSULTATION DATE: 12/15 REFERRING MD: Dr. Vanita Panda EDP, CHIEF COMPLAINT: Cardiac arrest  History of Present Illness:  71 year old female with past medical history as below, which is significant for hypertension, hyperlipidemia, type 2 diabetes mellitus, and asthma.  She was in her usual state of health until approximately 5:30 AM on 12/15 when she was seen by granddaughter.  Family attempted to contact her via phone around 7 AM and she did not answer.  When they later went to check on her around 10 AM she was unresponsive in her bed with red emesis seen around her.  Upon arrival to the fire department the patient was unresponsive but did have pulse and respirations. Then suffered seizure.  Upon EMS arrival patient lost pulses and CPR was initiated.   CPR less than 5 minutes.  Patient demonstrated seizure-like activity en route to [facility] emergency department.  Improved with benzodiazepine administration.  Upon arrival to emergency department the patient was intubated.  She remained unresponsive at the time PCCM was asked to admit.  Pertinent  Medical History   has a past medical history of ALLERGIC RHINITIS (01/29/2007), Arthritis, ASTHMA (01/29/2007), ASTHMA, WITH ACUTE EXACERBATION (06/24/2008), BACK PAIN (01/29/2009), CHEST PAIN (06/24/2008), Family history of adverse reaction to anesthesia, GERD (gastroesophageal reflux disease), History of shingles, HYPERLIPIDEMIA (01/29/2007), HYPERTENSION (01/29/2007), OSTEOPENIA (10/16/2007), and Type II or unspecified type diabetes mellitus without mention of complication, uncontrolled (11/29/2013).   Significant Hospital Events: Including procedures, antibiotic start and stop dates in addition to other pertinent events   12/15 admit to [place] s/p cardiac arrest 12/17 MRI - neg acute, stable moderate atrophy and white matter disease. This likely reflects the sequela of  chronic microvascular ischemia. 12/20-weaning well, no cuff leak. Steroids started.  12/20 not tolerating SBT, rapid rate.  12/22 Heparin stopped 2/2 hematuria 12/23>> Extubated 12/24 >> Heparin restarted , at a lower therapeutic range Interim History / Subjective:   Hungry, no other complaints.  Passed swallow No acute events overnight.  Hypokalemia>> repleted Net + 14 L  CT Chest with MF pneumonia and moderate bilateral effusions   Objective   Blood pressure (!) 130/119, pulse 92, temperature 98.3 F (36.8 C), temperature source Oral, resp. rate 16, height '5\' 3"'$  (1.6 m), weight 77 kg, SpO2 99 %.        Intake/Output Summary (Last 24 hours) at 04/18/2022 0906 Last data filed at 04/18/2022 0109 Gross per 24 hour  Intake 1831.31 ml  Output 1925 ml  Net -93.69 ml   Filed Weights   04/16/22 0405 04/17/22 0339 04/18/22 0500  Weight: 78.1 kg 76.2 kg 77 kg    Examination:  General: elderly appearing female in NAD, sitting up in chair HEENT: [place]/AT, PERRL, no JVD Neuro: Alert, oriented, non-focal, MAE x 4, Appropriate Chest:  Clear bilateral breath sounds, diminished per bases Heart: Tachy, regular, no MRG, ST per tele Abdomen: Soft, NT, ND, BS + Skin: Grossly intact , warm and dry  Resolved Hospital Problem list     Assessment & Plan:   Status post out-of-hospital PEA cardiac arrest In hospital for V-fib/V. tach cardiac arrest in the setting of severe hypokalemia Possible massive PE - Did receive TNKase, doppler legs - Completed normothermia protocol - Continue electrolyte repletion prn - Restart heparin low therapeutic dose ramge per pharmacy  Acute respiratory failure with hypoxia Aspiration pneumonia>> sputum Cx Few Serratia Marcesens>> re cultured Net + 14  L - 12/23 Extubated to 4 L [place] - CT chest>> Multifocal pneumonia, patchy infiltrates both lungs R>L, small bilateral moderate pleural effusions  - Aggressive Pulmonary Toilet, IS, Flutter valve - OOB to chair   - Cefepime started 12/23 - Lasix 40 mg IV x 1  Seizure-like activity EEG negative for seizures -On Keppra at present, transition to po dosing -? When we can discontinue. Not a home med. If she did have seizure it was likely a result of profound metabolic disarray.  Acute kidney injury due to ischemic ATN -Appreciate nephrology -Creatinine finally trending down today. BUN slight down trend  Demand ischemia -Post CPR -No wall motion abnormalities on echo - OP follow up cards  Anemia: hgb stabilized - Concern for GI bleed/retroperitoneal bleed - CT scan negative for retroperitoneal bleed - Trend CBC  HTN - hold home losartan d/t kidney failure - Start  amlodipine po  Hypernatremia - Free water stopped, taking po's well - Continue to trend BMET  Diabetic ketoacidosis -Transition to basal + SSI - CBG's   Acute metabolic encephalopathy Resolved  Appropriate in affect and manner     Best Practice (right click and "Reselect all SmartList Selections" daily)   Diet/type: Regular diet DVT prophylaxis: Systemic heparin>> lower therapeutic range GI prophylaxis: Protonix Lines: N/A Foley:  Yes, and it is still needed Code Status:  full code Last date of multidisciplinary goals of care discussion [12/24: Patient's daughter was updated at bedside  Labs   CBC: Recent Labs  Lab 04/14/22 0421 04/15/22 0513 04/16/22 0332 04/16/22 1433 04/17/22 0339 04/18/22 0233  WBC 7.0 5.1 6.7  --  8.8 14.1*  HGB 8.0* 8.4* 8.3* 8.2* 7.8* 8.5*  HCT 26.1* 26.2* 26.0* 25.1* 25.2* 26.8*  MCV 75.4* 73.0* 74.1*  --  75.4* 74.4*  PLT 104* 105* 144*  --  190 542    Basic Metabolic Panel: Recent Labs  Lab 04/13/22 0419 04/14/22 0421 04/15/22 0513 04/16/22 0332 04/17/22 0339 04/18/22 0233  NA 145 149* 149* 155* 154* 148*  K 3.1* 3.9 4.4 3.8 3.0* 3.1*  CL 110 114* 115* 115* 119* 113*  CO2 '23 25 24 24 26 23  '$ GLUCOSE 197* 200* 233* 173* 169* 129*  BUN 47* 54* 66* 75* 68* 56*   CREATININE 5.57* 5.64* 5.48* 4.70* 3.43* 2.47*  CALCIUM 7.6* 8.0* 8.4* 9.1 8.6* 8.9  MG 2.0 1.9  --  2.1 1.9 1.5*  PHOS 4.3 4.8*  --  5.0* 4.5 4.7*   GFR: Estimated Creatinine Clearance: 20.8 mL/min (A) (by C-G formula based on SCr of 2.47 mg/dL (H)). Recent Labs  Lab 04/15/22 0513 04/16/22 0332 04/17/22 0339 04/18/22 0233  WBC 5.1 6.7 8.8 14.1*    Liver Function Tests: No results for input(s): "AST", "ALT", "ALKPHOS", "BILITOT", "PROT", "ALBUMIN" in the last 168 hours. No results for input(s): "LIPASE", "AMYLASE" in the last 168 hours. No results for input(s): "AMMONIA" in the last 168 hours.  ABG    Component Value Date/Time   PHART 7.459 (H) 04/10/2022 1035   PCO2ART 35.1 04/10/2022 1035   PO2ART 68 (L) 04/10/2022 1035   HCO3 24.9 04/10/2022 1035   TCO2 26 04/10/2022 1035   ACIDBASEDEF 4.0 (H) 04/09/2022 1450   O2SAT 94 04/10/2022 1035     Coagulation Profile: No results for input(s): "INR", "PROTIME" in the last 168 hours.  Cardiac Enzymes: No results for input(s): "CKTOTAL", "CKMB", "CKMBINDEX", "TROPONINI" in the last 168 hours.  HbA1C: Hgb A1c MFr Bld  Date/Time Value Ref Range  Status  04/12/2022 05:03 AM >15.5 (H) 4.8 - 5.6 % Final    Comment:    (NOTE)         Prediabetes: 5.7 - 6.4         Diabetes: >6.4         Glycemic control for adults with diabetes: <7.0   04/09/2022 06:33 PM >15.5 (H) 4.8 - 5.6 % Final    Comment:    (NOTE)         Prediabetes: 5.7 - 6.4         Diabetes: >6.4         Glycemic control for adults with diabetes: <7.0     CBG: Recent Labs  Lab 04/17/22 1530 04/17/22 1950 04/17/22 2323 04/18/22 0351 04/18/22 0723  GLUCAP 79 212* 108* 127* 109*  Critical care time: 45 minutes  Magdalen Spatz, MSN, AGACNP-BC [place] for personal pager PCCM on call pager [phone]  See Amion for personal pager PCCM on call pager [phone] until 7pm. Please call Elink 7p-7a.  [phone]  04/18/2022 9:06 AM

## 2022-04-18 NOTE — Progress Notes (Signed)
Brief Progress Note CT Chest done 04/17/2022 shows  8.5 x 2.9 cm soft tissue density in the medial left upper lung field. There is occlusion of left upper lobe bronchus in the left hilar region. Similar findings were seen in previous CT done on 07/10/2018.  However, atelectasis near the left hilum appears slightly more prominent in size in comparison with the previous examination. Recommendation is for 3 month follow up CT Chest , if this still appears to be there at follow up she will need a PET scan as she does have a remote history of smoking , quit 2005.  Deborah Spatz, MSN, AGACNP-BC [place] for personal pager PCCM on call pager [phone]  04/18/2022 3:33 PM

## 2022-04-18 NOTE — Progress Notes (Signed)
ANTICOAGULATION CONSULT NOTE  Pharmacy Consult for heparin  Indication: pulmonary embolus and clot right ventricle  Allergies  Allergen Reactions   Shingrix [Zoster Vac Recomb Adjuvanted]     Allergy to original live zoster - rash    Patient Measurements: Height: '5\' 3"'$  (160 cm) Weight: 77 kg (169 lb 12.1 oz) IBW/kg (Calculated) : 52.4 Heparin Dosing Weight: 68kg  Vital Signs: Temp: 98.3 F (36.8 C) (12/24 2008) Temp Source: Oral (12/24 2008) BP: 124/67 (12/24 2000) Pulse Rate: 97 (12/24 2000)  Labs: Recent Labs    04/16/22 0332 04/16/22 0333 04/16/22 1433 04/17/22 0339 04/18/22 0233 04/18/22 1257 04/18/22 1453 04/18/22 1858  HGB 8.3*  --    < > 7.8* 8.5* 8.8*  --   --   HCT 26.0*  --    < > 25.2* 26.8* 26.0*  --   --   PLT 144*  --   --  190 235  --   --   --   HEPARINUNFRC  --  0.40  --  <0.10*  --   --   --  0.20*  CREATININE 4.70*  --   --  3.43* 2.47*  --  2.27*  --    < > = values in this interval not displayed.     Estimated Creatinine Clearance: 22.6 mL/min (A) (by C-G formula based on SCr of 2.27 mg/dL (H)).  Assessment: Pt was admitted for being unresponsive with CPR administered. Pt went into cardiac arrest and found to have thrombus in right ventricle on ECHO. TNK was administered. Heparin gtt ordered for anticoagulation post TNK.   Heparin was held for the past 2 days for hematuria.  Restarted with no bolus and lower goal.  Heparin level 0.2 trending up but subtherapeutic on 950 units/hr. No s/sx bleeding or issues with transfusion per RN.    Goal of Therapy:  Heparin level 0.3-0.5 units/ml Monitor platelets by anticoagulation protocol: Yes   Plan:  Increase heparin to 1,000 units/hr Check 8 hour heparin level Daily heparin level and CBC Monitor s/sx bleeding   Eliseo Gum, PharmD PGY1 Pharmacy Resident   04/18/2022  8:48 PM

## 2022-04-18 NOTE — Evaluation (Signed)
Physical Therapy Evaluation Patient Details Name: Deborah Neal MRN: [id number] DOB: Sep 12, 1950 Today's Date: 04/18/2022  History of Present Illness  71 year old female with past medical history significant for hypertension, hyperlipidemia, type 2 diabetes mellitus, and asthma.  Admitted after found at home unresponsive then with seizure and PEA arrest.  Intubated 12/15-12/23/23.  Found to have severe hypokalemia and massive PE received TNKase and heparin with some issues with hematuria and hematemesis, with CT chest positive for multifocal pneumonia.  Clinical Impression  Patient presents with decreased mobility due to generalized weakness, decreased activity tolerance, decreased balance, decreased cardiopulmonary endurance.  She previously lived with grandaughter but was independent.  Currently mod A overall to get up to recliner.  Feel she will need follow up acute inpatient rehab prior to d/c home.   PT will continue to follow.      Recommendations for follow up therapy are one component of a multi-disciplinary discharge planning process, led by the attending physician.  Recommendations may be updated based on patient status, additional functional criteria and insurance authorization.  Follow Up Recommendations Acute inpatient rehab (3hours/day)      Assistance Recommended at Discharge Intermittent Supervision/Assistance  Patient can return home with the following  A lot of help with walking and/or transfers;A lot of help with bathing/dressing/bathroom;Assistance with cooking/housework;Assist for transportation;Help with stairs or ramp for entrance    Equipment Recommendations Other (comment) (TBA)  Recommendations for Other Services  Rehab consult    Functional Status Assessment Patient has had a recent decline in their functional status and demonstrates the ability to make significant improvements in function in a reasonable and predictable amount of time.     Precautions /  Restrictions Precautions Precautions: Fall Precaution Comments: on HFNC      Mobility  Bed Mobility Overal bed mobility: Needs Assistance Bed Mobility: Supine to Sit, Sit to Supine     Supine to sit: Mod assist, HOB elevated Sit to supine: Mod assist   General bed mobility comments: lifting assist to sit upright and increased time to scoot to EOB then assist for legs to supine    Transfers Overall transfer level: Needs assistance   Transfers: Bed to chair/wheelchair/BSC   Stand pivot transfers: Mod assist         General transfer comment: up to stand HHA to get to recliner about a step away and reaching for opposite armrest; back to bed with chair closer and still stepping with HHA    Ambulation/Gait                  Stairs            Wheelchair Mobility    Modified Rankin (Stroke Patients Only)       Balance Overall balance assessment: Needs assistance Sitting-balance support: Feet supported Sitting balance-Leahy Scale: Fair Sitting balance - Comments: when feet not supported needing UE support   Standing balance support: Single extremity supported, During functional activity Standing balance-Leahy Scale: Poor                               Pertinent Vitals/Pain Pain Assessment Pain Assessment: Faces Faces Pain Scale: Hurts little more Pain Location: chest with mobility Pain Descriptors / Indicators: Discomfort, Grimacing, Guarding Pain Intervention(s): Monitored during session, Repositioned    Home Living Family/patient expects to be discharged to:: Private residence Living Arrangements: Other relatives Advertising account executive) Available Help at Discharge: Family;Available PRN/intermittently Type of Home: House Home Access:  Stairs to enter Entrance Stairs-Rails: Right Entrance Stairs-Number of Steps: 4   Home Layout: One level Home Equipment: None      Prior Function Prior Level of Function : Independent/Modified Independent                ADLs Comments: cooks and cleans, does not drive     Hand Dominance   Dominant Hand: Right    Extremity/Trunk Assessment   Upper Extremity Assessment Upper Extremity Assessment: Generalized weakness    Lower Extremity Assessment Lower Extremity Assessment: Generalized weakness       Communication   Communication: No difficulties  Cognition Arousal/Alertness: Awake/alert Behavior During Therapy: WFL for tasks assessed/performed Overall Cognitive Status: Within Functional Limits for tasks assessed                                 General Comments: not formally tested, very agreeable to mobilize despite RN just getting her to bed a little earlier after up in chair 8-12        General Comments General comments (skin integrity, edema, etc.): RR in 30's on 7L HFNC, HR 117, SpO2 98%    Exercises     Assessment/Plan    PT Assessment Patient needs continued PT services  PT Problem List Decreased strength;Decreased balance;Cardiopulmonary status limiting activity;Decreased mobility;Decreased activity tolerance;Decreased safety awareness;Decreased knowledge of precautions       PT Treatment Interventions DME instruction;Functional mobility training;Balance training;Patient/family education;Therapeutic activities;Gait training;Therapeutic exercise;Stair training    PT Goals (Current goals can be found in the Care Plan section)  Acute Rehab PT Goals Patient Stated Goal: to get stronger PT Goal Formulation: With patient Time For Goal Achievement: 05/02/22 Potential to Achieve Goals: Good    Frequency Min 3X/week     Co-evaluation               AM-PAC PT "6 Clicks" Mobility  Outcome Measure Help needed turning from your back to your side while in a flat bed without using bedrails?: A Lot Help needed moving from lying on your back to sitting on the side of a flat bed without using bedrails?: A Lot Help needed moving to and from a bed  to a chair (including a wheelchair)?: A Lot Help needed standing up from a chair using your arms (e.g., wheelchair or bedside chair)?: A Lot Help needed to walk in hospital room?: Total Help needed climbing 3-5 steps with a railing? : Total 6 Click Score: 10    End of Session Equipment Utilized During Treatment: Gait belt;Oxygen Activity Tolerance: Patient limited by fatigue Patient left: in bed;with call bell/phone within reach   PT Visit Diagnosis: Muscle weakness (generalized) (M62.81);Other abnormalities of gait and mobility (R26.89)    Time: 1312-1340 PT Time Calculation (min) (ACUTE ONLY): 28 min   Charges:   PT Evaluation $PT Eval Moderate Complexity: 1 Mod PT Treatments $Therapeutic Activity: 8-22 mins        Magda Kiel, PT Acute Rehabilitation Services Office:[phone] 04/18/2022   Reginia Naas 04/18/2022, 3:15 PM

## 2022-04-18 NOTE — Progress Notes (Signed)
ANTICOAGULATION CONSULT NOTE  Pharmacy Consult for heparin  Indication: pulmonary embolus and clot right ventricle  Allergies  Allergen Reactions   Shingrix [Zoster Vac Recomb Adjuvanted]     Allergy to original live zoster - rash    Patient Measurements: Height: '5\' 3"'$  (160 cm) Weight: 77 kg (169 lb 12.1 oz) IBW/kg (Calculated) : 52.4 Heparin Dosing Weight: 68kg  Vital Signs: Temp: 98.3 F (36.8 C) (12/24 0352) Temp Source: Oral (12/24 0352) BP: 153/78 (12/24 0900) Pulse Rate: 103 (12/24 0900)  Labs: Recent Labs    04/15/22 1407 04/16/22 0332 04/16/22 0332 04/16/22 0333 04/16/22 1433 04/17/22 0339 04/18/22 0233  HGB  --  8.3*   < >  --  8.2* 7.8* 8.5*  HCT  --  26.0*   < >  --  25.1* 25.2* 26.8*  PLT  --  144*  --   --   --  190 235  HEPARINUNFRC 0.36  --   --  0.40  --  <0.10*  --   CREATININE  --  4.70*  --   --   --  3.43* 2.47*   < > = values in this interval not displayed.     Estimated Creatinine Clearance: 20.8 mL/min (A) (by C-G formula based on SCr of 2.47 mg/dL (H)).  Assessment: Pt was admitted for being unresponsive with CPR administered. Pt went into cardiac arrest and found to have thrombus in right ventricle on ECHO. TNK was administered. Heparin gtt ordered for anticoagulation post TNK.   Heparin was held for the past 2 days for hematuria.  Will restart with no bolus and lower goal.  Goal of Therapy:  Heparin level 0.3-0.5 units/ml Monitor platelets by anticoagulation protocol: Yes   Plan:  Restart heparin at 950 units/hr Heparin levels 8 hours Daily heparin level and CBC Continue to monitor platelets closely.   Alanda Slim, PharmD, [facility] Clinical Pharmacist Please see AMION for all Pharmacists' Contact Phone Numbers 04/18/2022, 9:55 AM

## 2022-04-18 NOTE — Progress Notes (Signed)
[facility] Progress Note Patient Name: Deborah Neal DOB: 1950-12-08 MRN: [id number]   Date of Service  04/18/2022  HPI/Events of Note  Patient no long on tube feeding. 8 PM blood glucose = 212 and is now 108. Patient is scheduled to get SemGlee insulin 10 units [place] now.   eICU Interventions  Will decrease SemGlee dose to 5 units [place] Q 12 hours.      Intervention Category Major Interventions: Other:  Lysle Dingwall 04/18/2022, 12:33 AM

## 2022-04-19 ENCOUNTER — Inpatient Hospital Stay (HOSPITAL_COMMUNITY): Payer: Medicare PPO

## 2022-04-19 DIAGNOSIS — I469 Cardiac arrest, cause unspecified: Secondary | ICD-10-CM | POA: Diagnosis not present

## 2022-04-19 LAB — CBC WITH DIFFERENTIAL/PLATELET
Abs Immature Granulocytes: 0.14 10*3/uL — ABNORMAL HIGH (ref 0.00–0.07)
Abs Immature Granulocytes: 0.2 10*3/uL — ABNORMAL HIGH (ref 0.00–0.07)
Basophils Absolute: 0 10*3/uL (ref 0.0–0.1)
Basophils Absolute: 0 10*3/uL (ref 0.0–0.1)
Basophils Relative: 0 %
Basophils Relative: 0 %
Eosinophils Absolute: 0.2 10*3/uL (ref 0.0–0.5)
Eosinophils Absolute: 0.2 10*3/uL (ref 0.0–0.5)
Eosinophils Relative: 2 %
Eosinophils Relative: 1 %
HCT: 23.3 % — ABNORMAL LOW (ref 36.0–46.0)
HCT: 29 % — ABNORMAL LOW (ref 36.0–46.0)
Hemoglobin: 7.3 g/dL — ABNORMAL LOW (ref 12.0–15.0)
Hemoglobin: 9.3 g/dL — ABNORMAL LOW (ref 12.0–15.0)
Immature Granulocytes: 2 %
Immature Granulocytes: 1 %
Lymphocytes Relative: 14 %
Lymphocytes Relative: 11 %
Lymphs Abs: 1.3 10*3/uL (ref 0.7–4.0)
Lymphs Abs: 1.6 10*3/uL (ref 0.7–4.0)
MCH: 23.4 pg — ABNORMAL LOW (ref 26.0–34.0)
MCH: 23.7 pg — ABNORMAL LOW (ref 26.0–34.0)
MCHC: 31.3 g/dL (ref 30.0–36.0)
MCHC: 32.1 g/dL (ref 30.0–36.0)
MCV: 74.7 fL — ABNORMAL LOW (ref 80.0–100.0)
MCV: 74 fL — ABNORMAL LOW (ref 80.0–100.0)
Monocytes Absolute: 0.4 10*3/uL (ref 0.1–1.0)
Monocytes Absolute: 0.4 10*3/uL (ref 0.1–1.0)
Monocytes Relative: 4 %
Monocytes Relative: 3 %
Neutro Abs: 7.4 10*3/uL (ref 1.7–7.7)
Neutro Abs: 11.6 10*3/uL — ABNORMAL HIGH (ref 1.7–7.7)
Neutrophils Relative %: 78 %
Neutrophils Relative %: 84 %
Platelets: 220 10*3/uL (ref 150–400)
Platelets: 286 10*3/uL (ref 150–400)
RBC: 3.12 MIL/uL — ABNORMAL LOW (ref 3.87–5.11)
RBC: 3.92 MIL/uL (ref 3.87–5.11)
RDW: 14.7 % (ref 11.5–15.5)
RDW: 14.8 % (ref 11.5–15.5)
WBC: 9.4 10*3/uL (ref 4.0–10.5)
WBC: 14.1 10*3/uL — ABNORMAL HIGH (ref 4.0–10.5)
nRBC: 0.4 % — ABNORMAL HIGH (ref 0.0–0.2)
nRBC: 0.3 % — ABNORMAL HIGH (ref 0.0–0.2)

## 2022-04-19 LAB — MAGNESIUM: Magnesium: 1.8 mg/dL (ref 1.7–2.4)

## 2022-04-19 LAB — BASIC METABOLIC PANEL
Anion gap: 12 (ref 5–15)
BUN: 43 mg/dL — ABNORMAL HIGH (ref 8–23)
CO2: 22 mmol/L (ref 22–32)
Calcium: 8.2 mg/dL — ABNORMAL LOW (ref 8.9–10.3)
Chloride: 112 mmol/L — ABNORMAL HIGH (ref 98–111)
Creatinine, Ser: 2.06 mg/dL — ABNORMAL HIGH (ref 0.44–1.00)
GFR, Estimated: 25 mL/min — ABNORMAL LOW (ref >60)
Glucose, Bld: 129 mg/dL — ABNORMAL HIGH (ref 70–99)
Potassium: 3.4 mmol/L — ABNORMAL LOW (ref 3.5–5.1)
Sodium: 146 mmol/L — ABNORMAL HIGH (ref 135–145)

## 2022-04-19 LAB — HEPARIN LEVEL (UNFRACTIONATED)
Heparin Unfractionated: 0.24 IU/mL — ABNORMAL LOW (ref 0.30–0.70)
Heparin Unfractionated: 0.14 IU/mL — ABNORMAL LOW (ref 0.30–0.70)

## 2022-04-19 LAB — GLUCOSE, CAPILLARY
Glucose-Capillary: 158 mg/dL — ABNORMAL HIGH (ref 70–99)
Glucose-Capillary: 75 mg/dL (ref 70–99)
Glucose-Capillary: 247 mg/dL — ABNORMAL HIGH (ref 70–99)
Glucose-Capillary: 242 mg/dL — ABNORMAL HIGH (ref 70–99)
Glucose-Capillary: 276 mg/dL — ABNORMAL HIGH (ref 70–99)

## 2022-04-19 MED ORDER — MAGNESIUM SULFATE 2 GM/50ML IV SOLN
2.0000 g | Freq: Once | INTRAVENOUS | Status: AC
  Administered 2022-04-19: 2 g via INTRAVENOUS
  Filled 2022-04-19: qty 50

## 2022-04-19 MED ORDER — POTASSIUM CHLORIDE CRYS ER 20 MEQ PO TBCR
40.0000 meq | EXTENDED_RELEASE_TABLET | Freq: Two times a day (BID) | ORAL | Status: DC
  Administered 2022-04-19: 40 meq via ORAL
  Filled 2022-04-19 (×2): qty 2

## 2022-04-19 MED ORDER — HEPARIN (PORCINE) 25000 UT/250ML-% IV SOLN
1150.0000 [IU]/h | INTRAVENOUS | Status: DC
  Administered 2022-04-19: 1100 [IU]/h via INTRAVENOUS
  Administered 2022-04-20: 1250 [IU]/h via INTRAVENOUS
  Administered 2022-04-21: 1650 [IU]/h via INTRAVENOUS
  Administered 2022-04-21: 1400 [IU]/h via INTRAVENOUS
  Administered 2022-04-23: 1150 [IU]/h via INTRAVENOUS
  Filled 2022-04-19 (×7): qty 250

## 2022-04-19 MED ORDER — MAGNESIUM SULFATE 2 GM/50ML IV SOLN: 2.0000 g | Freq: Once | INTRAVENOUS | Status: DC

## 2022-04-19 MED ORDER — IPRATROPIUM-ALBUTEROL 0.5-2.5 (3) MG/3ML IN SOLN
3.0000 mL | Freq: Two times a day (BID) | RESPIRATORY_TRACT | Status: DC
  Administered 2022-04-19 – 2022-04-23 (×8): 3 mL via RESPIRATORY_TRACT
  Filled 2022-04-19 (×8): qty 3

## 2022-04-19 MED ORDER — FUROSEMIDE 10 MG/ML IJ SOLN
40.0000 mg | Freq: Once | INTRAMUSCULAR | Status: AC
  Administered 2022-04-19: 40 mg via INTRAVENOUS
  Filled 2022-04-19: qty 4

## 2022-04-19 MED ORDER — AMLODIPINE BESYLATE 5 MG PO TABS
7.5000 mg | ORAL_TABLET | Freq: Every day | ORAL | Status: DC
  Administered 2022-04-20 – 2022-04-25 (×6): 7.5 mg via ORAL
  Filled 2022-04-19 (×6): qty 1

## 2022-04-19 NOTE — Progress Notes (Addendum)
NAME:  Deborah Neal, MRN:  [id number], DOB:  Apr 17, 1951, LOS: 42 ADMISSION DATE:  04/09/2022, CONSULTATION DATE: 12/15 REFERRING MD: Dr. Vanita Panda EDP, CHIEF COMPLAINT: Cardiac arrest  History of Present Illness:  71 year old female with past medical history as below, which is significant for hypertension, hyperlipidemia, type 2 diabetes mellitus, and asthma.  She was in her usual state of health until approximately 5:30 AM on 12/15 when she was seen by granddaughter.  Family attempted to contact her via phone around 7 AM and she did not answer.  When they later went to check on her around 10 AM she was unresponsive in her bed with red emesis seen around her.  Upon arrival to the fire department the patient was unresponsive but did have pulse and respirations. Then suffered seizure.  Upon EMS arrival patient lost pulses and CPR was initiated.   CPR less than 5 minutes.  Patient demonstrated seizure-like activity en route to [facility] emergency department.  Improved with benzodiazepine administration.  Upon arrival to emergency department the patient was intubated.  She remained unresponsive at the time PCCM was asked to admit.  Pertinent  Medical History   has a past medical history of ALLERGIC RHINITIS (01/29/2007), Arthritis, ASTHMA (01/29/2007), ASTHMA, WITH ACUTE EXACERBATION (06/24/2008), BACK PAIN (01/29/2009), CHEST PAIN (06/24/2008), Family history of adverse reaction to anesthesia, GERD (gastroesophageal reflux disease), History of shingles, HYPERLIPIDEMIA (01/29/2007), HYPERTENSION (01/29/2007), OSTEOPENIA (10/16/2007), and Type II or unspecified type diabetes mellitus without mention of complication, uncontrolled (11/29/2013).   Significant Hospital Events: Including procedures, antibiotic start and stop dates in addition to other pertinent events   12/15 admit to [place] s/p cardiac arrest 12/17 MRI - neg acute, stable moderate atrophy and white matter disease. This likely reflects the sequela of  chronic microvascular ischemia. 12/20-weaning well, no cuff leak. Steroids started.  12/20 not tolerating SBT, rapid rate.  12/22 Heparin stopped 2/2 hematuria 12/23>> Extubated 12/24 >> Heparin restarted , at a lower therapeutic range        12/25 HGB from from 8.8 to 7.3 after heparin resumed 12/25 Interim History / Subjective:   Hungry, no other complaints.  States her breathing is better. On 2 L [place] HGB drop from 8.8 to 7.3 overnight  Good diuresis overnight 3700 cc urine output last 24 hours Hypokalemia>> Will replete Hypo Mag>> Will replete Net + 12.5 L  CT Chest with MF pneumonia and moderate bilateral effusions    Objective   Blood pressure (!) 150/75, pulse 95, temperature 98.9 F (37.2 C), temperature source Oral, resp. rate 20, height '5\' 3"'$  (1.6 m), weight 78.3 kg, SpO2 92 %.        Intake/Output Summary (Last 24 hours) at 04/19/2022 1159 Last data filed at 04/19/2022 1100 Gross per 24 hour  Intake 1214.16 ml  Output 3400 ml  Net -2185.84 ml   Filed Weights   04/17/22 0339 04/18/22 0500 04/19/22 0500  Weight: 76.2 kg 77 kg 78.3 kg    Examination:  General: elderly appearing female in NAD, sitting up in chair, wearing 2 L [place] HEENT: [place]/AT, PERRL, no JVD Neuro: Alert, oriented x 3, non-focal, MAE x 4, Appropriate Chest:  Clear bilateral breath sounds, diminished per bases Heart: Tachy, regular, no MRG, ST per tele Abdomen: Soft, NT, ND, BS + Skin: Grossly intact , warm and dry  Creatinine continues to down trend now 2.06>> was 2.27 Na 146 K 3.4>> repleted Cl 112 BUN 43 from 52 Calcium 8.2 , last albumin of 3.3  corrects to 8.8 WBC 9.4, HGB 7.3( from 8.8) >> Heparin resumed low dose  T Max 99.7  CXR 04/19/2022 No significant change in diffuse bilateral interstitial and airspace opacities and left lower lobe consolidation or collapse.   Masslike opacity in left hilar region, suspicious for central left lung mass or lymphadenopathy.  Chest CT with IV  contrast is recommended for further evaluation.     Resolved Hospital Problem list     Assessment & Plan:   Status post out-of-hospital PEA cardiac arrest In hospital for V-fib/V. tach cardiac arrest in the setting of severe hypokalemia Possible massive PE - Did receive TNKase, doppler legs - Completed normothermia protocol - Continue electrolyte repletion prn - Restart heparin low therapeutic dose range per pharmacy  Acute respiratory failure with hypoxia Aspiration pneumonia>> sputum Cx Few Serratia Marcesens>> re cultured Net + 14 L - 12/23 Extubated to 4 L [place], now weaned to 2 L [place] - CT chest>> Multifocal pneumonia, patchy infiltrates both lungs R>L, small bilateral moderate pleural effusions  - Aggressive Pulmonary Toilet, IS, Flutter valve - OOB to chair  - Cefepime started 12/23   - Lasix 40 mg IV x 1 12/25>> pleural effusion   Seizure-like activity EEG negative for seizures -On Keppra at present, transition to po dosing -? When we can discontinue. Not a home med. If she did have seizure it was likely a result of profound metabolic disarray.  Acute kidney injury due to ischemic ATN -Appreciate nephrology -Creatinine finally trending down . BUN slight down trend - Trend BMET  Demand ischemia -Post CPR -No wall motion abnormalities on echo - OP follow up cards  Anemia: hgb continues to slowly down trend>> 8.8 on 12/24 , now 7.3 on 12/25>> heparin low dose resumed 12/24 - Concern for GI bleed/retroperitoneal bleed - CT scan negative for retroperitoneal bleed - Trend CBC - Stop heparin for drop in HGB - Will repeat CBC to ensure reading of 7.3 is correct 12/25 - Will refer to IR for IVC filter placement 04/20/2022 if repeat HGB is confirms drop  HTN Slightly elevated  - hold home losartan d/t kidney failure - Will increase  amlodipine po to 7.5 mg daily  Hypernatremia - Free water stopped, taking po's well - Continue to trend BMET\- May beed to add free  water back if continues to up trend   Diabetic ketoacidosis -Transition to basal + SSI - CBG's   Acute metabolic encephalopathy Resolved  Appropriate in affect and manner  Soft Tissue Density in medial L lung More prominent in size since last CT chest 07/10/2018 Remote smoking history Plan Will need follow up CT Chest as OP in 3 months, or sooner as indicated  If still appears to be there at follow up , consider follow up with PET scan on OP basis   Will recheck CBC and see if the HGB drop is repeatable. Will consider transfer to Progressive bed if HGB stable  Best Practice (right click and "Reselect all SmartList Selections" daily)   Diet/type: Regular diet DVT prophylaxis: Systemic heparin>> lower therapeutic range GI prophylaxis: Protonix Lines: N/A Foley:  Yes, and it is still needed Code Status:  full code Last date of multidisciplinary goals of care discussion [12/24: Patient's daughter was updated at bedside  Labs   CBC: Recent Labs  Lab 04/15/22 0513 04/16/22 0332 04/16/22 1433 04/17/22 0339 04/18/22 0233 04/18/22 1257 04/19/22 0510  WBC 5.1 6.7  --  8.8 14.1*  --  9.4  NEUTROABS  --   --   --   --   --   --  7.4  HGB 8.4* 8.3* 8.2* 7.8* 8.5* 8.8* 7.3*  HCT 26.2* 26.0* 25.1* 25.2* 26.8* 26.0* 23.3*  MCV 73.0* 74.1*  --  75.4* 74.4*  --  74.7*  PLT 105* 144*  --  190 235  --  161    Basic Metabolic Panel: Recent Labs  Lab 04/13/22 0419 04/14/22 0421 04/15/22 0513 04/16/22 0332 04/17/22 0339 04/18/22 0233 04/18/22 1257 04/18/22 1453 04/19/22 0510  NA 145 149*   < > 155* 154* 148* 147* 147* 146*  K 3.1* 3.9   < > 3.8 3.0* 3.1* 3.8 3.7 3.4*  CL 110 114*   < > 115* 119* 113*  --  111 112*  CO2 23 25   < > '24 26 23  '$ --  25 22  GLUCOSE 197* 200*   < > 173* 169* 129*  --  183* 129*  BUN 47* 54*   < > 75* 68* 56*  --  52* 43*  CREATININE 5.57* 5.64*   < > 4.70* 3.43* 2.47*  --  2.27* 2.06*  CALCIUM 7.6* 8.0*   < > 9.1 8.6* 8.9  --  8.5* 8.2*  MG 2.0  1.9  --  2.1 1.9 1.5*  --   --  1.8  PHOS 4.3 4.8*  --  5.0* 4.5 4.7*  --   --   --    < > = values in this interval not displayed.   GFR: Estimated Creatinine Clearance: 25.2 mL/min (A) (by C-G formula based on SCr of 2.06 mg/dL (H)). Recent Labs  Lab 04/16/22 0332 04/17/22 0339 04/18/22 0233 04/19/22 0510  WBC 6.7 8.8 14.1* 9.4    Liver Function Tests: No results for input(s): "AST", "ALT", "ALKPHOS", "BILITOT", "PROT", "ALBUMIN" in the last 168 hours. No results for input(s): "LIPASE", "AMYLASE" in the last 168 hours. No results for input(s): "AMMONIA" in the last 168 hours.  ABG    Component Value Date/Time   PHART 7.420 04/18/2022 1257   PCO2ART 38.8 04/18/2022 1257   PO2ART 66 (L) 04/18/2022 1257   HCO3 25.2 04/18/2022 1257   TCO2 26 04/18/2022 1257   ACIDBASEDEF 4.0 (H) 04/09/2022 1450   O2SAT 93 04/18/2022 1257     Coagulation Profile: No results for input(s): "INR", "PROTIME" in the last 168 hours.  Cardiac Enzymes: No results for input(s): "CKTOTAL", "CKMB", "CKMBINDEX", "TROPONINI" in the last 168 hours.  HbA1C: Hgb A1c MFr Bld  Date/Time Value Ref Range Status  04/12/2022 05:03 AM >15.5 (H) 4.8 - 5.6 % Final    Comment:    (NOTE)         Prediabetes: 5.7 - 6.4         Diabetes: >6.4         Glycemic control for adults with diabetes: <7.0   04/09/2022 06:33 PM >15.5 (H) 4.8 - 5.6 % Final    Comment:    (NOTE)         Prediabetes: 5.7 - 6.4         Diabetes: >6.4         Glycemic control for adults with diabetes: <7.0     CBG: Recent Labs  Lab 04/18/22 1935 04/18/22 2323 04/19/22 0317 04/19/22 0738 04/19/22 1126  GLUCAP 190* 248* 158* 75 247*  Critical care time: 40 minutes  Magdalen Spatz, MSN, AGACNP-BC [place] for personal pager PCCM on call pager [phone]  See Amion for personal pager PCCM on call pager [phone] until  7pm. Please call Elink 7p-7a. [phone]  04/19/2022  11:59 AM

## 2022-04-19 NOTE — Progress Notes (Signed)
ANTICOAGULATION CONSULT NOTE - Follow Up Consult  Pharmacy Consult for heparin Indication:  PE and RV thrombus  Labs: Recent Labs    04/17/22 0339 04/18/22 0233 04/18/22 1257 04/18/22 1453 04/18/22 1858 04/19/22 0510  HGB 7.8* 8.5* 8.8*  --   --  7.3*  HCT 25.2* 26.8* 26.0*  --   --  23.3*  PLT 190 235  --   --   --  220  HEPARINUNFRC <0.10*  --   --   --  0.20* 0.24*  CREATININE 3.43* 2.47*  --  2.27*  --  2.06*    Assessment: 71yo female subtherapeutic on heparin after rate change; no infusion issues or signs of bleeding per RN.  Goal of Therapy:  Heparin level 0.3-0.5 units/ml   Plan:  Will increase heparin infusion cautiously by 10% to 1100 units/hr and check level in 8 hours.    Wynona Neat, PharmD, BCPS  04/19/2022,6:24 AM

## 2022-04-19 NOTE — Progress Notes (Signed)
ANTICOAGULATION CONSULT NOTE  Pharmacy Consult for heparin  Indication: pulmonary embolus and clot right ventricle  Allergies  Allergen Reactions   Shingrix [Zoster Vac Recomb Adjuvanted]     Allergy to original live zoster - rash    Patient Measurements: Height: '5\' 3"'$  (160 cm) Weight: 78.3 kg (172 lb 9.9 oz) IBW/kg (Calculated) : 52.4 Heparin Dosing Weight: 68kg  Vital Signs: Temp: 98.5 F (36.9 C) (12/25 1515) Temp Source: Oral (12/25 1515) BP: 148/69 (12/25 1200) Pulse Rate: 96 (12/25 1200)  Labs: Recent Labs    04/18/22 0233 04/18/22 1257 04/18/22 1453 04/18/22 1858 04/19/22 0510 04/19/22 1424  HGB 8.5* 8.8*  --   --  7.3* 9.3*  HCT 26.8* 26.0*  --   --  23.3* 29.0*  PLT 235  --   --   --  220 286  HEPARINUNFRC  --   --   --  0.20* 0.24* 0.14*  CREATININE 2.47*  --  2.27*  --  2.06*  --      Estimated Creatinine Clearance: 25.2 mL/min (A) (by C-G formula based on SCr of 2.06 mg/dL (H)).  Assessment: Pt was admitted for being unresponsive with CPR administered. Pt went into cardiac arrest and found to have thrombus in right ventricle on ECHO. TNK was administered. Heparin gtt ordered for anticoagulation post TNK.   Heparin was paused briefly with low Hgb, now to resume with improvement in Hgb. Heparin level at 1400 subtherapeutic but was drawn with heparin off.   Goal of Therapy:  Heparin level 0.3-0.5 units/ml Monitor platelets by anticoagulation protocol: Yes   Plan:  Resume heparin 1100 units/h Recheck heparin level in 8h  Arrie Senate, PharmD, [place], [facility] [place] Clinical Pharmacist [phone] Please check AMION for all [place] numbers 04/19/2022

## 2022-04-19 NOTE — Progress Notes (Signed)
Brief Progress Note  HGB repeat was 9.3.  AM HGB was most likely diluted due to peripheral edema and hard stick. Will resume heparin per pharmacy with low therapeutic goal of 0.3 to 0.5. Order placed, Dr. Ander Slade aware

## 2022-04-20 ENCOUNTER — Telehealth: Payer: Self-pay | Admitting: Internal Medicine

## 2022-04-20 DIAGNOSIS — I469 Cardiac arrest, cause unspecified: Secondary | ICD-10-CM | POA: Diagnosis not present

## 2022-04-20 LAB — CBC WITH DIFFERENTIAL/PLATELET
Abs Immature Granulocytes: 0.17 10*3/uL — ABNORMAL HIGH (ref 0.00–0.07)
Basophils Absolute: 0 10*3/uL (ref 0.0–0.1)
Basophils Relative: 0 %
Eosinophils Absolute: 0.1 10*3/uL (ref 0.0–0.5)
Eosinophils Relative: 1 %
HCT: 24.1 % — ABNORMAL LOW (ref 36.0–46.0)
Hemoglobin: 7.6 g/dL — ABNORMAL LOW (ref 12.0–15.0)
Immature Granulocytes: 2 %
Lymphocytes Relative: 14 %
Lymphs Abs: 1.5 10*3/uL (ref 0.7–4.0)
MCH: 23.5 pg — ABNORMAL LOW (ref 26.0–34.0)
MCHC: 31.5 g/dL (ref 30.0–36.0)
MCV: 74.4 fL — ABNORMAL LOW (ref 80.0–100.0)
Monocytes Absolute: 0.4 10*3/uL (ref 0.1–1.0)
Monocytes Relative: 4 %
Neutro Abs: 8.4 10*3/uL — ABNORMAL HIGH (ref 1.7–7.7)
Neutrophils Relative %: 79 %
Platelets: 264 10*3/uL (ref 150–400)
RBC: 3.24 MIL/uL — ABNORMAL LOW (ref 3.87–5.11)
RDW: 14.6 % (ref 11.5–15.5)
WBC: 10.7 10*3/uL — ABNORMAL HIGH (ref 4.0–10.5)
nRBC: 0.2 % (ref 0.0–0.2)

## 2022-04-20 LAB — CBC
HCT: 25.7 % — ABNORMAL LOW (ref 36.0–46.0)
Hemoglobin: 8 g/dL — ABNORMAL LOW (ref 12.0–15.0)
MCH: 23 pg — ABNORMAL LOW (ref 26.0–34.0)
MCHC: 31.1 g/dL (ref 30.0–36.0)
MCV: 73.9 fL — ABNORMAL LOW (ref 80.0–100.0)
Platelets: 279 10*3/uL (ref 150–400)
RBC: 3.48 MIL/uL — ABNORMAL LOW (ref 3.87–5.11)
RDW: 14.8 % (ref 11.5–15.5)
WBC: 10.8 10*3/uL — ABNORMAL HIGH (ref 4.0–10.5)
nRBC: 0 % (ref 0.0–0.2)

## 2022-04-20 LAB — BASIC METABOLIC PANEL
Anion gap: 8 (ref 5–15)
BUN: 32 mg/dL — ABNORMAL HIGH (ref 8–23)
CO2: 25 mmol/L (ref 22–32)
Calcium: 8.5 mg/dL — ABNORMAL LOW (ref 8.9–10.3)
Chloride: 111 mmol/L (ref 98–111)
Creatinine, Ser: 1.68 mg/dL — ABNORMAL HIGH (ref 0.44–1.00)
GFR, Estimated: 33 mL/min — ABNORMAL LOW (ref >60)
Glucose, Bld: 148 mg/dL — ABNORMAL HIGH (ref 70–99)
Potassium: 3.3 mmol/L — ABNORMAL LOW (ref 3.5–5.1)
Sodium: 144 mmol/L (ref 135–145)

## 2022-04-20 LAB — GLUCOSE, CAPILLARY
Glucose-Capillary: 110 mg/dL — ABNORMAL HIGH (ref 70–99)
Glucose-Capillary: 126 mg/dL — ABNORMAL HIGH (ref 70–99)
Glucose-Capillary: 213 mg/dL — ABNORMAL HIGH (ref 70–99)
Glucose-Capillary: 218 mg/dL — ABNORMAL HIGH (ref 70–99)
Glucose-Capillary: 248 mg/dL — ABNORMAL HIGH (ref 70–99)
Glucose-Capillary: 353 mg/dL — ABNORMAL HIGH (ref 70–99)

## 2022-04-20 LAB — HEPARIN LEVEL (UNFRACTIONATED)
Heparin Unfractionated: 0.26 IU/mL — ABNORMAL LOW (ref 0.30–0.70)
Heparin Unfractionated: 0.13 IU/mL — ABNORMAL LOW (ref 0.30–0.70)

## 2022-04-20 LAB — MAGNESIUM: Magnesium: 1.9 mg/dL (ref 1.7–2.4)

## 2022-04-20 MED ORDER — SODIUM CHLORIDE 0.9 % IV SOLN
2.0000 g | Freq: Two times a day (BID) | INTRAVENOUS | Status: AC
  Administered 2022-04-20 – 2022-04-24 (×7): 2 g via INTRAVENOUS
  Filled 2022-04-20 (×7): qty 12.5

## 2022-04-20 MED ORDER — CHLORHEXIDINE GLUCONATE CLOTH 2 % EX PADS
6.0000 | MEDICATED_PAD | Freq: Every day | CUTANEOUS | Status: DC
  Administered 2022-04-20 – 2022-04-23 (×4): 6 via TOPICAL

## 2022-04-20 MED ORDER — ADULT MULTIVITAMIN W/MINERALS CH
1.0000 | ORAL_TABLET | Freq: Every day | ORAL | Status: DC
  Administered 2022-04-21 – 2022-04-25 (×5): 1 via ORAL
  Filled 2022-04-20 (×5): qty 1

## 2022-04-20 MED ORDER — ENSURE ENLIVE PO LIQD
237.0000 mL | Freq: Three times a day (TID) | ORAL | Status: DC
  Administered 2022-04-21 – 2022-04-25 (×6): 237 mL via ORAL

## 2022-04-20 MED ORDER — MAGNESIUM SULFATE 2 GM/50ML IV SOLN
2.0000 g | Freq: Once | INTRAVENOUS | Status: AC
  Administered 2022-04-20: 2 g via INTRAVENOUS
  Filled 2022-04-20: qty 50

## 2022-04-20 MED ORDER — INSULIN ASPART 100 UNIT/ML IJ SOLN
0.0000 [IU] | Freq: Three times a day (TID) | INTRAMUSCULAR | Status: DC
  Administered 2022-04-20 (×2): 5 [IU] via SUBCUTANEOUS
  Administered 2022-04-21 (×2): 8 [IU] via SUBCUTANEOUS
  Administered 2022-04-21 – 2022-04-22 (×2): 11 [IU] via SUBCUTANEOUS
  Administered 2022-04-22: 3 [IU] via SUBCUTANEOUS
  Administered 2022-04-22: 11 [IU] via SUBCUTANEOUS
  Administered 2022-04-23: 5 [IU] via SUBCUTANEOUS
  Administered 2022-04-23: 11 [IU] via SUBCUTANEOUS
  Administered 2022-04-23: 3 [IU] via SUBCUTANEOUS
  Administered 2022-04-24: 2 [IU] via SUBCUTANEOUS
  Administered 2022-04-24: 5 [IU] via SUBCUTANEOUS
  Administered 2022-04-24: 2 [IU] via SUBCUTANEOUS
  Administered 2022-04-25: 5 [IU] via SUBCUTANEOUS
  Administered 2022-04-25: 8 [IU] via SUBCUTANEOUS

## 2022-04-20 MED ORDER — POTASSIUM CHLORIDE CRYS ER 20 MEQ PO TBCR
40.0000 meq | EXTENDED_RELEASE_TABLET | Freq: Once | ORAL | Status: AC
  Administered 2022-04-20: 40 meq via ORAL
  Filled 2022-04-20: qty 2

## 2022-04-20 MED ORDER — FUROSEMIDE 10 MG/ML IJ SOLN
40.0000 mg | Freq: Every day | INTRAMUSCULAR | Status: DC
  Administered 2022-04-20 – 2022-04-25 (×6): 40 mg via INTRAVENOUS
  Filled 2022-04-20 (×6): qty 4

## 2022-04-20 NOTE — Telephone Encounter (Signed)
HFU scheduled w WO for 05/24/2022 '@330pm'$ 

## 2022-04-20 NOTE — Care Management Important Message (Signed)
Important Message  Patient Details  Name: Deborah Neal MRN: [id number] Date of Birth: 07/27/1950   Medicare Important Message Given:  Yes     [name] Montine Circle 04/20/2022, 3:50 PM

## 2022-04-20 NOTE — Inpatient Diabetes Management (Signed)
Inpatient Diabetes Program Recommendations  AACE/ADA: New Consensus Statement on Inpatient Glycemic Control (2015)  Target Ranges:  Prepandial:   less than 140 mg/dL      Peak postprandial:   less than 180 mg/dL (1-2 hours)      Critically ill patients:  140 - 180 mg/dL   Lab Results  Component Value Date   GLUCAP 110 (H) 04/20/2022   HGBA1C >15.5 (H) 04/12/2022    Review of Glycemic Control  Latest Reference Range & Units 04/19/22 07:38 04/19/22 11:26 04/19/22 15:13 04/19/22 19:15 04/19/22 23:59 04/20/22 04:05 04/20/22 08:54  Glucose-Capillary 70 - 99 mg/dL 75 247 (H) 242 (H) 276 (H) 248 (H) 126 (H) 110 (H)  (H): Data is abnormally high  Diabetes history: DM 2 Outpatient Diabetes medications: Jardiance 25 mg daily, Toujeo 100 units daily, Humulin R U500 60 units TID with meals Current orders for Inpatient glycemic control: Semglee 5 units BID, Novolog 0-15 units Q4H  Inpatient Diabetes Program Recommendations:    If patient is eating well, please consider:  Novolog 0-15 units TID and 0-5 units QHS Novolog 2-3 units TID with meals if she consumes at least 50%  Will continue to follow while inpatient.  Thank you, Reche Dixon, MSN, [place] Diabetes Coordinator Inpatient Diabetes Program [phone] (team pager from 8a-5p)

## 2022-04-20 NOTE — Progress Notes (Signed)
NAME:  Deborah Neal, MRN:  [id number], DOB:  1951-03-04, LOS: 73 ADMISSION DATE:  04/09/2022, CONSULTATION DATE: 12/15 REFERRING MD: Dr. Vanita Panda EDP, CHIEF COMPLAINT: Cardiac arrest  History of Present Illness:  71 year old female with past medical history as below, which is significant for hypertension, hyperlipidemia, type 2 diabetes mellitus, and asthma.  She was in her usual state of health until approximately 5:30 AM on 12/15 when she was seen by granddaughter.  Family attempted to contact her via phone around 7 AM and she did not answer.  When they later went to check on her around 10 AM she was unresponsive in her bed with red emesis seen around her.  Upon arrival to the fire department the patient was unresponsive but did have pulse and respirations. Then suffered seizure.  Upon EMS arrival patient lost pulses and CPR was initiated.   CPR less than 5 minutes.  Patient demonstrated seizure-like activity en route to [facility] emergency department.  Improved with benzodiazepine administration.  Upon arrival to emergency department the patient was intubated.  She remained unresponsive at the time PCCM was asked to admit.  Pertinent  Medical History   has a past medical history of ALLERGIC RHINITIS (01/29/2007), Arthritis, ASTHMA (01/29/2007), ASTHMA, WITH ACUTE EXACERBATION (06/24/2008), BACK PAIN (01/29/2009), CHEST PAIN (06/24/2008), Family history of adverse reaction to anesthesia, GERD (gastroesophageal reflux disease), History of shingles, HYPERLIPIDEMIA (01/29/2007), HYPERTENSION (01/29/2007), OSTEOPENIA (10/16/2007), and Type II or unspecified type diabetes mellitus without mention of complication, uncontrolled (11/29/2013).   Significant Hospital Events: Including procedures, antibiotic start and stop dates in addition to other pertinent events   12/15 admit to [place] s/p cardiac arrest 12/17 MRI - neg acute, stable moderate atrophy and white matter disease. This likely reflects the sequela of  chronic microvascular ischemia. 12/20-weaning well, no cuff leak. Steroids started.  12/20 not tolerating SBT, rapid rate.  12/22 Heparin stopped 2/2 hematuria 12/23>> Extubated 12/24 >> Heparin restarted , at a lower therapeutic range        12/25 HGB from from 8.8 to 7.3 after heparin resumed 12/25 Interim History / Subjective:   Hungry, no other complaints.  States her breathing is better. On 2 L [place] HGB drop from 8.8 to 7.3 overnight  Good diuresis overnight 3700 cc urine output last 24 hours Hypokalemia>> Will replete Hypo Mag>> Will replete Net + 12.5 L  CT Chest with MF pneumonia and moderate bilateral effusions    Objective   Blood pressure (!) 167/65, pulse 87, temperature 98.2 F (36.8 C), temperature source Oral, resp. rate 14, height '5\' 3"'$  (1.6 m), weight 80.2 kg, SpO2 100 %.    FiO2 (%):  [40 %] 40 %   Intake/Output Summary (Last 24 hours) at 04/20/2022 1041 Last data filed at 04/20/2022 9357 Gross per 24 hour  Intake 730.01 ml  Output 3030 ml  Net -2299.99 ml   Filed Weights   04/18/22 0500 04/19/22 0500 04/20/22 0356  Weight: 77 kg 78.3 kg 80.2 kg    Examination:  General: elderly appearing female in NAD, sitting up in chair, wearing 2 L [place] HEENT: /AT, PERRL, no JVD Neuro: Alert, oriented x 3, non-focal, MAE x 4, Appropriate Chest:  Clear bilateral breath sounds, diminished per bases Heart: Tachy, regular, no MRG, ST per tele Abdomen: Soft, NT, ND, BS + Skin: Grossly intact , warm and dry  Creatinine continues to down trend now 2.06>> was 2.27 Na 146 K 3.4>> repleted Cl 112 BUN 43 from 52 Calcium 8.2 ,  last albumin of 3.3 corrects to 8.8 WBC 9.4, HGB 7.3( from 8.8) >> Heparin resumed low dose  T Max 99.7  CXR 04/19/2022 No significant change in diffuse bilateral interstitial and airspace opacities and left lower lobe consolidation or collapse.   Masslike opacity in left hilar region, suspicious for central left lung mass or lymphadenopathy.   Chest CT with IV contrast is recommended for further evaluation.     Resolved Hospital Problem list     Assessment & Plan:   Status post out-of-hospital PEA cardiac arrest In hospital for V-fib/V. tach cardiac arrest in the setting of severe hypokalemia Possible massive PE Did receive TNKase, doppler legs Completed normothermia protocol Continue heparin drip  Acute respiratory failure with hypoxia Aspiration pneumonia Multifocal pneumonia by CT scan Continue antimicrobial therapy That she was activated 1223 She is given Lasix 1225 Out of bed as tolerated   Seizure-like activity EEG negative for seizures  Transition to p.o. Keppra ?  Which can be discontinued in the future most likely will need 6 months    Acute kidney injury due to ischemic ATN Lab Results  Component Value Date   CREATININE 1.68 (H) 04/20/2022   CREATININE 2.06 (H) 04/19/2022   CREATININE 2.27 (H) 04/18/2022   CREATININE 0.96 01/09/2020    Nephrology is following Continue to monitor creatinine is improving   Demand ischemia Post CPR for PEA arrest Follow-up with cardiology as an outpatient  Anemia:  Recent Labs    04/19/22 1424 04/20/22 0245  HGB 9.3* 7.6*    CT scan negative for retroperitoneal bleed Trend CBC Transfuse per protocol Currently on heparin drip some concerns she may need inferior vena cava filter Recheck hemoglobin today  HTN Holding home medications right now  Hypernatremia Recent Labs  Lab 04/18/22 1453 04/19/22 0510 04/20/22 0245  NA 147* 146* 144    Resolved   Diabetic ketoacidosis CBG (last 3)  Recent Labs    04/19/22 2359 04/20/22 0405 04/20/22 0854  GLUCAP 248* 126* 110*    Control with sliding scale insulin    Soft Tissue Density in medial L lung More prominent in size since last CT chest 07/10/2018 Remote smoking history Plan She will need follow-up in 3 months with CT of chest      Best Practice (right click and "Reselect  all SmartList Selections" daily)   Diet/type: Regular diet DVT prophylaxis: Systemic heparin>> lower therapeutic range GI prophylaxis: Protonix Lines: N/A Foley:  Yes, and it is still needed Code Status:  full code Last date of multidisciplinary goals of care discussion [12/24: Patient's daughter was updated at bedside  Labs   CBC: Recent Labs  Lab 04/17/22 0339 04/18/22 0233 04/18/22 1257 04/19/22 0510 04/19/22 1424 04/20/22 0245  WBC 8.8 14.1*  --  9.4 14.1* 10.7*  NEUTROABS  --   --   --  7.4 11.6* 8.4*  HGB 7.8* 8.5* 8.8* 7.3* 9.3* 7.6*  HCT 25.2* 26.8* 26.0* 23.3* 29.0* 24.1*  MCV 75.4* 74.4*  --  74.7* 74.0* 74.4*  PLT 190 235  --  220 286 330    Basic Metabolic Panel: Recent Labs  Lab 04/14/22 0421 04/15/22 0513 04/16/22 0332 04/17/22 0339 04/18/22 0233 04/18/22 1257 04/18/22 1453 04/19/22 0510 04/20/22 0245  NA 149*   < > 155* 154* 148* 147* 147* 146* 144  K 3.9   < > 3.8 3.0* 3.1* 3.8 3.7 3.4* 3.3*  CL 114*   < > 115* 119* 113*  --  111 112* 111  CO2 25   < > '24 26 23  '$ --  '25 22 25  '$ GLUCOSE 200*   < > 173* 169* 129*  --  183* 129* 148*  BUN 54*   < > 75* 68* 56*  --  52* 43* 32*  CREATININE 5.64*   < > 4.70* 3.43* 2.47*  --  2.27* 2.06* 1.68*  CALCIUM 8.0*   < > 9.1 8.6* 8.9  --  8.5* 8.2* 8.5*  MG 1.9  --  2.1 1.9 1.5*  --   --  1.8 1.9  PHOS 4.8*  --  5.0* 4.5 4.7*  --   --   --   --    < > = values in this interval not displayed.   GFR: Estimated Creatinine Clearance: 31.2 mL/min (A) (by C-G formula based on SCr of 1.68 mg/dL (H)). Recent Labs  Lab 04/18/22 0233 04/19/22 0510 04/19/22 1424 04/20/22 0245  WBC 14.1* 9.4 14.1* 10.7*    Liver Function Tests: No results for input(s): "AST", "ALT", "ALKPHOS", "BILITOT", "PROT", "ALBUMIN" in the last 168 hours. No results for input(s): "LIPASE", "AMYLASE" in the last 168 hours. No results for input(s): "AMMONIA" in the last 168 hours.  ABG    Component Value Date/Time   PHART 7.420  04/18/2022 1257   PCO2ART 38.8 04/18/2022 1257   PO2ART 66 (L) 04/18/2022 1257   HCO3 25.2 04/18/2022 1257   TCO2 26 04/18/2022 1257   ACIDBASEDEF 4.0 (H) 04/09/2022 1450   O2SAT 93 04/18/2022 1257     Coagulation Profile: No results for input(s): "INR", "PROTIME" in the last 168 hours.  Cardiac Enzymes: No results for input(s): "CKTOTAL", "CKMB", "CKMBINDEX", "TROPONINI" in the last 168 hours.  HbA1C: Hgb A1c MFr Bld  Date/Time Value Ref Range Status  04/12/2022 05:03 AM >15.5 (H) 4.8 - 5.6 % Final    Comment:    (NOTE)         Prediabetes: 5.7 - 6.4         Diabetes: >6.4         Glycemic control for adults with diabetes: <7.0   04/09/2022 06:33 PM >15.5 (H) 4.8 - 5.6 % Final    Comment:    (NOTE)         Prediabetes: 5.7 - 6.4         Diabetes: >6.4         Glycemic control for adults with diabetes: <7.0     CBG: Recent Labs  Lab 04/19/22 1513 04/19/22 1915 04/19/22 2359 04/20/22 0405 04/20/22 0854  GLUCAP 242* 276* 248* 126* 110*    Steve Keyry [place] ACNP Acute Care Nurse Practitioner [place] Please consult Amion 04/20/2022, 10:41 AM

## 2022-04-20 NOTE — Progress Notes (Signed)
PHARMACY NOTE:  ANTIMICROBIAL RENAL DOSAGE ADJUSTMENT  Current antimicrobial regimen includes a mismatch between antimicrobial dosage and estimated renal function.  As per policy approved by the Pharmacy & Therapeutics and Medical Executive Committees, the antimicrobial dosage will be adjusted accordingly.  Current antimicrobial dosage:  cefepime 2g IV q24h  Indication: Serratia PNA  Renal Function:  Estimated Creatinine Clearance: 31.2 mL/min (A) (by C-G formula based on SCr of 1.68 mg/dL (H)). '[]'$      On intermittent HD, scheduled: '[]'$      On CRRT    Antimicrobial dosage has been changed to:  cefepime 2g q12h  Additional comments:   Thank you for allowing pharmacy to be a part of this patient's care.  Einar Grad, [facility] 04/20/2022 11:03 AM

## 2022-04-20 NOTE — Plan of Care (Signed)
  Problem: Education: Goal: Ability to manage disease process will improve Outcome: Progressing   Problem: Cardiac: Goal: Ability to achieve and maintain adequate cardiopulmonary perfusion will improve Outcome: Progressing   Problem: Neurologic: Goal: Promote progressive neurologic recovery Outcome: Progressing   Problem: Skin Integrity: Goal: Risk for impaired skin integrity will be minimized. Outcome: Progressing   Problem: Education: Goal: Ability to describe self-care measures that may prevent or decrease complications (Diabetes Survival Skills Education) will improve Outcome: Progressing Goal: Individualized Educational Video(s) Outcome: Progressing   Problem: Coping: Goal: Ability to adjust to condition or change in health will improve Outcome: Progressing   Problem: Fluid Volume: Goal: Ability to maintain a balanced intake and output will improve Outcome: Progressing   Problem: Health Behavior/Discharge Planning: Goal: Ability to identify and utilize available resources and services will improve Outcome: Progressing Goal: Ability to manage health-related needs will improve Outcome: Progressing   Problem: Metabolic: Goal: Ability to maintain appropriate glucose levels will improve Outcome: Progressing   Problem: Nutritional: Goal: Maintenance of adequate nutrition will improve Outcome: Progressing Goal: Progress toward achieving an optimal weight will improve Outcome: Progressing   Problem: Skin Integrity: Goal: Risk for impaired skin integrity will decrease Outcome: Progressing   Problem: Tissue Perfusion: Goal: Adequacy of tissue perfusion will improve Outcome: Progressing   Problem: Education: Goal: Ability to describe self-care measures that may prevent or decrease complications (Diabetes Survival Skills Education) will improve Outcome: Progressing Goal: Individualized Educational Video(s) Outcome: Progressing   Problem: Cardiac: Goal: Ability to  maintain an adequate cardiac output will improve Outcome: Progressing   Problem: Health Behavior/Discharge Planning: Goal: Ability to identify and utilize available resources and services will improve Outcome: Progressing Goal: Ability to manage health-related needs will improve Outcome: Progressing   Problem: Fluid Volume: Goal: Ability to achieve a balanced intake and output will improve Outcome: Progressing   Problem: Metabolic: Goal: Ability to maintain appropriate glucose levels will improve Outcome: Progressing   Problem: Nutritional: Goal: Maintenance of adequate nutrition will improve Outcome: Progressing Goal: Maintenance of adequate weight for body size and type will improve Outcome: Progressing   Problem: Respiratory: Goal: Will regain and/or maintain adequate ventilation Outcome: Progressing   Problem: Urinary Elimination: Goal: Ability to achieve and maintain adequate renal perfusion and functioning will improve Outcome: Progressing   Problem: Education: Goal: Knowledge of General Education information will improve Description: Including pain rating scale, medication(s)/side effects and non-pharmacologic comfort measures Outcome: Progressing   Problem: Health Behavior/Discharge Planning: Goal: Ability to manage health-related needs will improve Outcome: Progressing   Problem: Clinical Measurements: Goal: Ability to maintain clinical measurements within normal limits will improve Outcome: Progressing Goal: Will remain free from infection Outcome: Progressing Goal: Diagnostic test results will improve Outcome: Progressing Goal: Respiratory complications will improve Outcome: Progressing Goal: Cardiovascular complication will be avoided Outcome: Progressing   Problem: Activity: Goal: Risk for activity intolerance will decrease Outcome: Progressing   Problem: Nutrition: Goal: Adequate nutrition will be maintained Outcome: Progressing   Problem:  Coping: Goal: Level of anxiety will decrease Outcome: Progressing   Problem: Elimination: Goal: Will not experience complications related to bowel motility Outcome: Progressing Goal: Will not experience complications related to urinary retention Outcome: Progressing   Problem: Pain Managment: Goal: General experience of comfort will improve Outcome: Progressing   Problem: Safety: Goal: Ability to remain free from injury will improve Outcome: Progressing   Problem: Skin Integrity: Goal: Risk for impaired skin integrity will decrease Outcome: Progressing

## 2022-04-20 NOTE — Progress Notes (Signed)
Physical Therapy Treatment Patient Details Name: Deborah Neal MRN: [id number] DOB: February 14, 1951 Today's Date: 04/20/2022   History of Present Illness 71 year old female with past medical history significant for hypertension, hyperlipidemia, type 2 diabetes mellitus, and asthma.  Admitted after found at home unresponsive then with seizure and PEA arrest.  Intubated 12/15-12/23/23.  Found to have severe hypokalemia and massive PE received TNKase and heparin with some issues with hematuria and hematemesis, with CT chest positive for multifocal pneumonia.    PT Comments    Pt daughter in room answers majority of questions, which daughter says is not her normal. Pt limited in safe mobility by need for supplemental O2, in presence of decreased strength, balance and endurance. Pt requires min A for bed mobility and transfers and min-modA for ambulation, experiencing posterior LoB while standing EoB which causes her to sit unexpectedly. D/c plans remain appropriate as pt and family eager for her to return to her prior independence.   Recommendations for follow up therapy are one component of a multi-disciplinary discharge planning process, led by the attending physician.  Recommendations may be updated based on patient status, additional functional criteria and insurance authorization.  Follow Up Recommendations  Acute inpatient rehab (3hours/day)     Assistance Recommended at Discharge Intermittent Supervision/Assistance  Patient can return home with the following A lot of help with walking and/or transfers;A lot of help with bathing/dressing/bathroom;Assistance with cooking/housework;Assist for transportation;Help with stairs or ramp for entrance   Equipment Recommendations  Other (comment) (TBA)    Recommendations for Other Services Rehab consult     Precautions / Restrictions Precautions Precautions: Fall Restrictions Weight Bearing Restrictions: No     Mobility  Bed Mobility Overal  bed mobility: Needs Assistance Bed Mobility: Supine to Sit, Sit to Supine     Supine to sit: HOB elevated, Min assist Sit to supine: Min guard   General bed mobility comments: min A for pad scoot to EoB, able to return to bed with min guard for safety    Transfers Overall transfer level: Needs assistance   Transfers: Sit to/from Stand Sit to Stand: Min assist           General transfer comment: increased multimodal cues for hand placement in coming to standing in RW, pt able to come to standing but needs min A for bringing CoG over BoS. pt has posterior LoB causing her to sit back down, on second attempt pt needs to be reminded of hand placement and again needs assist to achieve balance    Ambulation/Gait Ambulation/Gait assistance: Min assist, Mod assist, +2 safety/equipment Gait Distance (Feet): 18 Feet Assistive device: Rolling walker (2 wheels) Gait Pattern/deviations: Step-through pattern, Decreased step length - right, Decreased step length - left, Shuffle, Trunk flexed Gait velocity: slowed Gait velocity interpretation: <1.31 ft/sec, indicative of household ambulator Pre-gait activities: marching in place General Gait Details: min-modA for steadying and RW management. Constant cuing for upright posture, looking up and out and proximity to RW, no carryover,         Balance Overall balance assessment: Needs assistance Sitting-balance support: Feet supported Sitting balance-Leahy Scale: Fair     Standing balance support: Single extremity supported, During functional activity, Bilateral upper extremity supported, Reliant on assistive device for balance Standing balance-Leahy Scale: Poor Standing balance comment: increased posterior lean                            Cognition Arousal/Alertness: Awake/alert Behavior During Therapy:  WFL for tasks assessed/performed Overall Cognitive Status: Within Functional Limits for tasks assessed                                  General Comments: pt's daughter reports she seems back to normal but is not talking alot        Exercises General Exercises - Lower Extremity Hip Flexion/Marching: AROM, 10 reps, Standing    General Comments General comments (skin integrity, edema, etc.): BP slightly elevated after standing 1st time after posterior LoB, pt reports slight dizziness BP 134/92, pt on 4L O2 via [place], decreased to 2L O2 and pt able to maintain at rest and with ambulation      Pertinent Vitals/Pain Pain Assessment Pain Assessment: Faces Faces Pain Scale: Hurts a little bit Breathing: normal Negative Vocalization: none Facial Expression: smiling or inexpressive Body Language: relaxed Consolability: no need to console PAINAD Score: 0 Pain Location: chest with mobility Pain Descriptors / Indicators: Discomfort, Grimacing, Guarding Pain Intervention(s): Limited activity within patient's tolerance, Monitored during session     PT Goals (current goals can now be found in the care plan section) Acute Rehab PT Goals Patient Stated Goal: to get stronger PT Goal Formulation: With patient Time For Goal Achievement: 05/02/22 Potential to Achieve Goals: Good Progress towards PT goals: Progressing toward goals    Frequency    Min 3X/week      PT Plan Current plan remains appropriate       AM-PAC PT "6 Clicks" Mobility   Outcome Measure  Help needed turning from your back to your side while in a flat bed without using bedrails?: A Lot Help needed moving from lying on your back to sitting on the side of a flat bed without using bedrails?: A Lot Help needed moving to and from a bed to a chair (including a wheelchair)?: A Lot Help needed standing up from a chair using your arms (e.g., wheelchair or bedside chair)?: A Lot Help needed to walk in hospital room?: Total Help needed climbing 3-5 steps with a railing? : Total 6 Click Score: 10    End of Session Equipment Utilized  During Treatment: Gait belt;Oxygen Activity Tolerance: Patient limited by fatigue Patient left: in bed;with call bell/phone within reach Nurse Communication: Mobility status PT Visit Diagnosis: Muscle weakness (generalized) (M62.81);Other abnormalities of gait and mobility (R26.89)     Time: 3790-2409 PT Time Calculation (min) (ACUTE ONLY): 38 min  Charges:  $Gait Training: 8-22 mins $Therapeutic Exercise: 8-22 mins $Therapeutic Activity: 8-22 mins                     Deborah Ronda B. Migdalia Dk PT, DPT Acute Rehabilitation Services Please use secure chat or  Call Office [phone]    [place] 04/20/2022, 4:58 PM

## 2022-04-20 NOTE — Progress Notes (Signed)
ANTICOAGULATION CONSULT NOTE  Pharmacy Consult for heparin  Indication: pulmonary embolus and clot right ventricle  Allergies  Allergen Reactions   Shingrix [Zoster Vac Recomb Adjuvanted]     Allergy to original live zoster - rash    Patient Measurements: Height: '5\' 3"'$  (160 cm) Weight: 80.2 kg (176 lb 12.9 oz) IBW/kg (Calculated) : 52.4 Heparin Dosing Weight: 68kg  Vital Signs: Temp: 98.6 F (37 C) (12/26 2015) Temp Source: Oral (12/26 2015) BP: 131/70 (12/26 2015) Pulse Rate: 91 (12/26 2015)  Labs: Recent Labs    04/18/22 1453 04/18/22 1858 04/19/22 0510 04/19/22 1424 04/20/22 0245 04/20/22 1222 04/20/22 1946  HGB  --   --  7.3* 9.3* 7.6* 8.0*  --   HCT  --   --  23.3* 29.0* 24.1* 25.7*  --   PLT  --   --  220 286 264 279  --   HEPARINUNFRC  --    < > 0.24* 0.14* 0.26*  --  0.13*  CREATININE 2.27*  --  2.06*  --  1.68*  --   --    < > = values in this interval not displayed.     Estimated Creatinine Clearance: 31.2 mL/min (A) (by C-G formula based on SCr of 1.68 mg/dL (H)).  Assessment: Pt was admitted for being unresponsive with CPR administered. Pt went into cardiac arrest and found to have thrombus in right ventricle on ECHO. TNK was administered. Heparin gtt ordered for anticoagulation post TNK.   Heparin held briefly yesterday for low Hgb, now resumed. Heparin level is subtherapeutic at 0.26, Hgb low but stable.  Heparin came back slightly lower this PM. We will increase rate and check AM level Goal of Therapy:  Heparin level 0.3-0.5 units/ml Monitor platelets by anticoagulation protocol: Yes   Plan:  Increase heparin to 1400 units/h Recheck heparin level in AM  Onnie Boer, PharmD, [place], AAHIVP, CPP Infectious Disease Pharmacist 04/20/2022 8:34 PM

## 2022-04-20 NOTE — Progress Notes (Signed)
Nutrition Follow-up  DOCUMENTATION CODES:   Not applicable  INTERVENTION:  Provide Ensure Enlive po TID, each supplement provides 350 kcal and 20 grams of protein.  Provide multivitamin with minerals daily by mouth.  NUTRITION DIAGNOSIS:   Inadequate oral intake related to decreased appetite as evidenced by per patient/family report.  Updated nutrition diagnosis.  GOAL:   Patient will meet greater than or equal to 90% of their needs  Progressing with nutrition interventions.  MONITOR:   PO intake, Supplement acceptance, Labs, Weight trends, Skin, I & O's  REASON FOR ASSESSMENT:   Ventilator, Consult Enteral/tube feeding initiation and management (trickle TF)  ASSESSMENT:   71 year old female who presented to the ED on 12/15 after being found unresponsive by family. Pt seizing upon arrival and experienced out-of-hospital PEA cardiac arrest. Pt intubated in the ED. PMH of asthma, HTN, HLD, T2DM. Pt admitted with DKA, AKI, question upper GI bleed.  12/15: admitted s/p cardiac arrest; intubated 12/18: trickle tube feeds initiated 12/19: tube feeds advanced towards goal 12/23: extubated; diet advanced to clear liquids and then carbohydrate modified  Pt has been followed by Nephrology this admission. There was concern for possible need for RRT, but per review of chart was not initiated and Nephrology signed off 12/23.  Met with pt and her daughter at bedside. She is currently on [place] 2 L/min. Pt reports her appetite and intake were good at baseline. She typically ate 3 meals per day. For breakfast she would have eggs or sandwich. For lunch she would have soup or sandwich. For dinner she would have chicken or ham or pork chops with sides (rice, green beans, broccoli, corn). She currently has decreased appetite and intake. She reports she is eating about 50% of her meal trays. Limited documentation available in chart (100% of lunch and 50% of dinner on 12/23). Pt denies food  allergies or intolerances, nausea, emesis, abdominal pain, or difficulty chewing or swallowing. Noted pt edentulous and she reports dentures are at home, but she reports she is able to eat a regular texture diet without dentures and tolerates well. She is amenable to drinking Ensure to help meet calorie/protein needs while appetite returns. Encouraged adequate intake of protein.  Pt reports her UBW was about 168 lbs (76.4 kg) and she denies any unintentional weight loss PTA. However, per review of chart pt lost 9 kg or 11% weight from 04/13/21 to 01/22/22, which is not significant for time frame but is still concerning. Wt was 73.7 kg on 04/10/22. Current wt is 80.2 kg, suspect falsely elevated from fluid so will continue to monitor trend.  UOP: 3280 ml (1.7 mL/kg/hr)  I/O: +9,998.9 mL since admission  Medications reviewed and include: Lasix 40 mg daily, Novolog 0-15 units TID, Semglee 5 units BID, pantoprazole, cefepime, heparin  Labs reviewed: CBG 1110-248, Potassium 3.3, BUN 32, Creatinine 1.68  NUTRITION - FOCUSED PHYSICAL EXAM:  Flowsheet Row Most Recent Value  Orbital Region No depletion  Upper Arm Region No depletion  Thoracic and Lumbar Region No depletion  Buccal Region No depletion  Temple Region No depletion  Clavicle Bone Region No depletion  Clavicle and Acromion Bone Region No depletion  Scapular Bone Region No depletion  Dorsal Hand Unable to assess  [due to edema]  Patellar Region Mild depletion  Anterior Thigh Region Mild depletion  Posterior Calf Region Moderate depletion  Edema (RD Assessment) Moderate  Hair Reviewed  Eyes Reviewed  Mouth Reviewed  [edentulous]  Skin Reviewed  Nails Reviewed  Diet Order:   Diet Order             Diet Carb Modified Fluid consistency: Thin; Room service appropriate? Yes  Diet effective now                  EDUCATION NEEDS:   No education needs have been identified at this time  Skin:  Skin Assessment: Skin  Integrity Issues: Skin Integrity Issues:: Stage II Stage I: N/A Stage II: sacrum  Last BM:  12/25 - small type 5  Height:   Ht Readings from Last 1 Encounters:  04/10/22 _0  (1.6 m)   Weight:   Wt Readings from Last 1 Encounters:  04/20/22 80.2 kg   Ideal Body Weight:  52.3 kg  BMI:  Body mass index is 31.32 kg/m.  Estimated Nutritional Needs:   Kcal:  2000-2200  Protein:  100-115 grams  Fluid:  2-2.2 L/day  Loanne Drilling, MS, RD, LDN, CNSC Pager number available on Amion

## 2022-04-20 NOTE — Progress Notes (Signed)
RT called to bedside per RN Micheal. RN concerned of pt increased WOB and intercostal muscle usage. Pt on HFNC 5L with O2 stat of 89%. RT placed pt on Bipap 12/5,12, 40%. No ABG obtained, NO CXR obtained per DR. Due to pt current admission hx. Pt stable and tolerating with no distress noted at this time. Will continue to monitor for any additional clinical changes.

## 2022-04-20 NOTE — Progress Notes (Signed)
ANTICOAGULATION CONSULT NOTE - Follow Up Consult  Pharmacy Consult for heparin Indication:  PE and RV thrombus  Labs: Recent Labs    04/18/22 1453 04/18/22 1858 04/19/22 0510 04/19/22 1424 04/20/22 0245  HGB  --   --  7.3* 9.3* 7.6*  HCT  --   --  23.3* 29.0* 24.1*  PLT  --   --  220 286 264  HEPARINUNFRC  --    < > 0.24* 0.14* 0.26*  CREATININE 2.27*  --  2.06*  --  1.68*   < > = values in this interval not displayed.     Assessment/Plan: 71yo female subtherapeutic on heparin after resumed but Hgb trending back down (7.3>9.3>7.6); will hold off on making rate adjustment and recheck level.  Wynona Neat, PharmD, BCPS  04/20/2022,3:54 AM

## 2022-04-20 NOTE — Progress Notes (Signed)
Inpatient Rehab Admissions Coordinator:  ? ?Per therapy recommendations,  patient was screened for CIR candidacy by Ines Warf, MS, CCC-SLP. At this time, Pt. Appears to be a a potential candidate for CIR. I will place   order for rehab consult per protocol for full assessment. Please contact me any with questions. ? ?Birdia Jaycox, MS, CCC-SLP ?Rehab Admissions Coordinator  ?[phone] (celll) ?[phone] (office) ? ?

## 2022-04-20 NOTE — Progress Notes (Signed)
ANTICOAGULATION CONSULT NOTE  Pharmacy Consult for heparin  Indication: pulmonary embolus and clot right ventricle  Allergies  Allergen Reactions   Shingrix [Zoster Vac Recomb Adjuvanted]     Allergy to original live zoster - rash    Patient Measurements: Height: '5\' 3"'$  (160 cm) Weight: 80.2 kg (176 lb 12.9 oz) IBW/kg (Calculated) : 52.4 Heparin Dosing Weight: 68kg  Vital Signs: Temp: 98.2 F (36.8 C) (12/26 0856) Temp Source: Oral (12/26 0856) BP: 167/65 (12/26 0856) Pulse Rate: 87 (12/26 0856)  Labs: Recent Labs    04/18/22 1453 04/18/22 1858 04/19/22 0510 04/19/22 1424 04/20/22 0245  HGB  --   --  7.3* 9.3* 7.6*  HCT  --   --  23.3* 29.0* 24.1*  PLT  --   --  220 286 264  HEPARINUNFRC  --    < > 0.24* 0.14* 0.26*  CREATININE 2.27*  --  2.06*  --  1.68*   < > = values in this interval not displayed.     Estimated Creatinine Clearance: 31.2 mL/min (A) (by C-G formula based on SCr of 1.68 mg/dL (H)).  Assessment: Pt was admitted for being unresponsive with CPR administered. Pt went into cardiac arrest and found to have thrombus in right ventricle on ECHO. TNK was administered. Heparin gtt ordered for anticoagulation post TNK.   Heparin held briefly yesterday for low Hgb, now resumed. Heparin level is subtherapeutic at 0.26, Hgb low but stable.   Goal of Therapy:  Heparin level 0.3-0.5 units/ml Monitor platelets by anticoagulation protocol: Yes   Plan:  Increase heparin to 1250 units/h Recheck heparin level in 8h  Arrie Senate, PharmD, [place], [facility] Clinical Pharmacist [phone] Please check AMION for all [place] numbers 04/20/2022

## 2022-04-21 DIAGNOSIS — I469 Cardiac arrest, cause unspecified: Secondary | ICD-10-CM | POA: Diagnosis not present

## 2022-04-21 LAB — CBC
HCT: 24.5 % — ABNORMAL LOW (ref 36.0–46.0)
Hemoglobin: 7.3 g/dL — ABNORMAL LOW (ref 12.0–15.0)
MCH: 22.6 pg — ABNORMAL LOW (ref 26.0–34.0)
MCHC: 29.8 g/dL — ABNORMAL LOW (ref 30.0–36.0)
MCV: 75.9 fL — ABNORMAL LOW (ref 80.0–100.0)
Platelets: 278 10*3/uL (ref 150–400)
RBC: 3.23 MIL/uL — ABNORMAL LOW (ref 3.87–5.11)
RDW: 14.6 % (ref 11.5–15.5)
WBC: 8.5 10*3/uL (ref 4.0–10.5)
nRBC: 0.2 % (ref 0.0–0.2)

## 2022-04-21 LAB — URINALYSIS, ROUTINE W REFLEX MICROSCOPIC
Bilirubin Urine: NEGATIVE
Glucose, UA: >=500 mg/dL — AB
Ketones, ur: NEGATIVE mg/dL
Nitrite: NEGATIVE
Protein, ur: NEGATIVE mg/dL
RBC / HPF: >50 RBC/hpf — ABNORMAL HIGH (ref 0–5)
Specific Gravity, Urine: 1.009 (ref 1.005–1.030)
pH: 5 (ref 5.0–8.0)

## 2022-04-21 LAB — GLUCOSE, CAPILLARY
Glucose-Capillary: 275 mg/dL — ABNORMAL HIGH (ref 70–99)
Glucose-Capillary: 328 mg/dL — ABNORMAL HIGH (ref 70–99)
Glucose-Capillary: 286 mg/dL — ABNORMAL HIGH (ref 70–99)
Glucose-Capillary: 384 mg/dL — ABNORMAL HIGH (ref 70–99)

## 2022-04-21 LAB — BASIC METABOLIC PANEL
Anion gap: 9 (ref 5–15)
BUN: 23 mg/dL (ref 8–23)
CO2: 29 mmol/L (ref 22–32)
Calcium: 8.5 mg/dL — ABNORMAL LOW (ref 8.9–10.3)
Chloride: 103 mmol/L (ref 98–111)
Creatinine, Ser: 1.43 mg/dL — ABNORMAL HIGH (ref 0.44–1.00)
GFR, Estimated: 39 mL/min — ABNORMAL LOW (ref >60)
Glucose, Bld: 297 mg/dL — ABNORMAL HIGH (ref 70–99)
Potassium: 3.1 mmol/L — ABNORMAL LOW (ref 3.5–5.1)
Sodium: 141 mmol/L (ref 135–145)

## 2022-04-21 LAB — HEMOGLOBIN AND HEMATOCRIT, BLOOD
HCT: 28 % — ABNORMAL LOW (ref 36.0–46.0)
Hemoglobin: 8.5 g/dL — ABNORMAL LOW (ref 12.0–15.0)

## 2022-04-21 LAB — HEPARIN LEVEL (UNFRACTIONATED)
Heparin Unfractionated: 0.22 IU/mL — ABNORMAL LOW (ref 0.30–0.70)
Heparin Unfractionated: 0.13 IU/mL — ABNORMAL LOW (ref 0.30–0.70)
Heparin Unfractionated: 0.83 IU/mL — ABNORMAL HIGH (ref 0.30–0.70)

## 2022-04-21 LAB — ALBUMIN: Albumin: 2.6 g/dL — ABNORMAL LOW (ref 3.5–5.0)

## 2022-04-21 LAB — MAGNESIUM: Magnesium: 1.8 mg/dL (ref 1.7–2.4)

## 2022-04-21 MED ORDER — POTASSIUM CHLORIDE CRYS ER 20 MEQ PO TBCR
40.0000 meq | EXTENDED_RELEASE_TABLET | Freq: Once | ORAL | Status: AC
  Administered 2022-04-21: 40 meq via ORAL
  Filled 2022-04-21: qty 2

## 2022-04-21 MED ORDER — INSULIN GLARGINE-YFGN 100 UNIT/ML ~~LOC~~ SOLN
5.0000 [IU] | Freq: Two times a day (BID) | SUBCUTANEOUS | Status: DC
  Administered 2022-04-21 – 2022-04-22 (×2): 5 [IU] via SUBCUTANEOUS
  Filled 2022-04-21 (×3): qty 0.05

## 2022-04-21 MED ORDER — MAGNESIUM SULFATE 2 GM/50ML IV SOLN
2.0000 g | Freq: Once | INTRAVENOUS | Status: AC
  Administered 2022-04-21: 2 g via INTRAVENOUS
  Filled 2022-04-21: qty 50

## 2022-04-21 MED ORDER — INSULIN ASPART 100 UNIT/ML IJ SOLN
3.0000 [IU] | Freq: Three times a day (TID) | INTRAMUSCULAR | Status: DC
  Administered 2022-04-21 – 2022-04-22 (×4): 3 [IU] via SUBCUTANEOUS

## 2022-04-21 NOTE — Plan of Care (Signed)
  Problem: Education: Goal: Ability to manage disease process will improve Outcome: Progressing   Problem: Cardiac: Goal: Ability to achieve and maintain adequate cardiopulmonary perfusion will improve Outcome: Progressing   Problem: Neurologic: Goal: Promote progressive neurologic recovery Outcome: Progressing   Problem: Skin Integrity: Goal: Risk for impaired skin integrity will be minimized. Outcome: Progressing   Problem: Education: Goal: Ability to describe self-care measures that may prevent or decrease complications (Diabetes Survival Skills Education) will improve Outcome: Progressing Goal: Individualized Educational Video(s) Outcome: Progressing   Problem: Coping: Goal: Ability to adjust to condition or change in health will improve Outcome: Progressing   Problem: Fluid Volume: Goal: Ability to maintain a balanced intake and output will improve Outcome: Progressing   Problem: Health Behavior/Discharge Planning: Goal: Ability to identify and utilize available resources and services will improve Outcome: Progressing Goal: Ability to manage health-related needs will improve Outcome: Progressing   Problem: Metabolic: Goal: Ability to maintain appropriate glucose levels will improve Outcome: Progressing   Problem: Nutritional: Goal: Maintenance of adequate nutrition will improve Outcome: Progressing Goal: Progress toward achieving an optimal weight will improve Outcome: Progressing   Problem: Skin Integrity: Goal: Risk for impaired skin integrity will decrease Outcome: Progressing   Problem: Tissue Perfusion: Goal: Adequacy of tissue perfusion will improve Outcome: Progressing   Problem: Education: Goal: Ability to describe self-care measures that may prevent or decrease complications (Diabetes Survival Skills Education) will improve Outcome: Progressing Goal: Individualized Educational Video(s) Outcome: Progressing   Problem: Cardiac: Goal: Ability to  maintain an adequate cardiac output will improve Outcome: Progressing   Problem: Health Behavior/Discharge Planning: Goal: Ability to identify and utilize available resources and services will improve Outcome: Progressing Goal: Ability to manage health-related needs will improve Outcome: Progressing   Problem: Fluid Volume: Goal: Ability to achieve a balanced intake and output will improve Outcome: Progressing   Problem: Metabolic: Goal: Ability to maintain appropriate glucose levels will improve Outcome: Progressing   Problem: Nutritional: Goal: Maintenance of adequate nutrition will improve Outcome: Progressing Goal: Maintenance of adequate weight for body size and type will improve Outcome: Progressing   Problem: Respiratory: Goal: Will regain and/or maintain adequate ventilation Outcome: Progressing   Problem: Urinary Elimination: Goal: Ability to achieve and maintain adequate renal perfusion and functioning will improve Outcome: Progressing   Problem: Education: Goal: Knowledge of General Education information will improve Description: Including pain rating scale, medication(s)/side effects and non-pharmacologic comfort measures Outcome: Progressing   Problem: Health Behavior/Discharge Planning: Goal: Ability to manage health-related needs will improve Outcome: Progressing   Problem: Clinical Measurements: Goal: Ability to maintain clinical measurements within normal limits will improve Outcome: Progressing Goal: Will remain free from infection Outcome: Progressing Goal: Diagnostic test results will improve Outcome: Progressing Goal: Respiratory complications will improve Outcome: Progressing Goal: Cardiovascular complication will be avoided Outcome: Progressing   Problem: Activity: Goal: Risk for activity intolerance will decrease Outcome: Progressing   Problem: Nutrition: Goal: Adequate nutrition will be maintained Outcome: Progressing   Problem:  Coping: Goal: Level of anxiety will decrease Outcome: Progressing   Problem: Elimination: Goal: Will not experience complications related to bowel motility Outcome: Progressing Goal: Will not experience complications related to urinary retention Outcome: Progressing   Problem: Pain Managment: Goal: General experience of comfort will improve Outcome: Progressing   Problem: Safety: Goal: Ability to remain free from injury will improve Outcome: Progressing   Problem: Skin Integrity: Goal: Risk for impaired skin integrity will decrease Outcome: Progressing

## 2022-04-21 NOTE — Progress Notes (Signed)
Mobility Specialist Progress Note    04/21/22 1536  Mobility  Activity Ambulated with assistance in hallway  Level of Assistance +2 (takes two people) (chair follow)  Assistive Device Four wheel walker  Distance Ambulated (ft) 150 ft (40+60+50)  Activity Response Tolerated well  Mobility Referral Yes  $Mobility charge 1 Mobility   Pre-Mobility: 93 HR, 99% SpO2 During Mobility: 94 HR, >/=88% SpO2 Post-Mobility: 92 HR, 94% SpO2  Pt received in chair and agreeable. Pt minA to stand. Took x3 extended seated rest breaks. On RA SpO2 as low as 88% and recovered to low 90s with pursed lip breathing. Rolled back to room and transferred to chair. Left with call bell in reach and daughter present. RN advised to leave on RA.   Hildred Alamin Mobility Specialist  Please Psychologist, sport and exercise or Rehab Office at [phone]

## 2022-04-21 NOTE — Progress Notes (Addendum)
ANTICOAGULATION CONSULT NOTE  Pharmacy Consult for heparin  Indication: pulmonary embolus and clot right ventricle  Allergies  Allergen Reactions   Shingrix [Zoster Vac Recomb Adjuvanted]     Allergy to original live zoster - rash    Patient Measurements: Height: '5\' 3"'$  (160 cm) Weight: 80.9 kg (178 lb 5.6 oz) IBW/kg (Calculated) : 52.4 Heparin Dosing Weight: 68kg  Vital Signs: Temp: 97.7 F (36.5 C) (12/27 0422) Temp Source: Oral (12/27 0422) BP: 122/63 (12/27 0422) Pulse Rate: 72 (12/27 0422)  Labs: Recent Labs    04/19/22 0510 04/19/22 1424 04/20/22 0245 04/20/22 1222 04/20/22 1946 04/21/22 0529  HGB 7.3*   < > 7.6* 8.0*  --  7.3*  HCT 23.3*   < > 24.1* 25.7*  --  24.5*  PLT 220   < > 264 279  --  278  HEPARINUNFRC 0.24*   < > 0.26*  --  0.13* 0.22*  CREATININE 2.06*  --  1.68*  --   --  1.43*   < > = values in this interval not displayed.     Estimated Creatinine Clearance: 36.9 mL/min (A) (by C-G formula based on SCr of 1.43 mg/dL (H)).  Assessment: Pt was admitted for being unresponsive with CPR administered. Pt went into cardiac arrest and found to have thrombus in right ventricle on ECHO. TNK was administered. Heparin gtt ordered for anticoagulation post TNK.   Heparin level remains subtherapeutic this morning at 0.22, H/H low but stable, pltc stable. Per RN, IV has been leaking, now replaced. Will continue current rate and recheck a level in a few hours.  Goal of Therapy:  Heparin level 0.3-0.5 units/ml Monitor platelets by anticoagulation protocol: Yes   Plan:  Continue heparin 1400 units/h for now Recheck heparin level in 6h  ADDENDUM: repeat heparin level is subtherapeutic at 0.13, H/H stable on recheck. New IV not leaking but occasionally occluding with arm movement. Will increase heparin to 1650 units/h and recheck in 8h.   Arrie Senate, PharmD, BCPS, [facility] Clinical Pharmacist [phone] Please check AMION for all [place]  numbers 04/21/2022

## 2022-04-21 NOTE — Progress Notes (Signed)
PROGRESS NOTE    Deborah Neal  GBT:[id number] DOB: 05-03-1950 DOA: 04/09/2022 PCP: Biagio Borg, MD  Chief Complaint  Patient presents with   CPR    Brief Narrative:  71 year old female with past medical history as below, which is significant for hypertension, hyperlipidemia, type 2 diabetes mellitus, and asthma. She was in her usual state of health until approximately 5:30 AM on 12/15 when she was seen by granddaughter. Family attempted to contact her via phone around 7 AM and she did not answer. When they later went to check on her around 10 AM she was unresponsive in her bed with red emesis seen around her. Upon arrival to the fire department the patient was unresponsive but did have pulse and respirations. Then suffered seizure. Upon EMS arrival patient lost pulses and CPR was initiated. CPR less than 5 minutes. Patient demonstrated seizure-like activity en route to [facility] emergency department. Improved with benzodiazepine administration. Upon arrival to emergency department the patient was intubated. She remained unresponsive at the time PCCM was asked to admit.   Significant Events 12/15 admit to [place] s/p cardiac arrest 12/17 MRI - neg acute, stable moderate atrophy and white matter disease. This likely reflects the sequela of chronic microvascular ischemia. 12/20-weaning well, no cuff leak. Steroids started.  12/20 not tolerating SBT, rapid rate.  12/22 Heparin stopped 2/2 hematuria 12/23>> Extubated 12/24 >> Heparin restarted , at Dhiren Azimi lower therapeutic range        12/25 HGB from from 8.8 to 7.3 after heparin resumed 12/25   Assessment & Plan:   Principal Problem:   Cardiac arrest [facility]) Active Problems:   Acute respiratory failure with hypoxia (HCC)   Lactic acidosis   Seizure ([place])   Type 2 diabetes mellitus without complication, with long-term current use of insulin (HCC)   Shock ([place])   Encephalopathy acute  Out of Hospital PEA Cardiac Arrest In hospital  Vfib/vtach arrest in setting of severe hypokalemia Presumed Massive Pulmonary Embolism s/p TNKase Completed normothermia protocol Continue heparin gtt (concerns given anemia she may need IVC filter) (s/p TNKase) LE [place] negative for DVT Echo with findings concerning for clot in transit, RV function mildly reduced with free wall hypokinesis, EF 65-70% no RWMA, grade 1 diastolic dysfunction Consider CT PE protocol (none done yet with AKI, if renal function stable tomorrow, will pursue this)  Acute Hypoxic Respiratory Failure  Multifocal Pneumonia  Aspiration Pneumonia  Bilateral Pleural Effusions Serratia Marcescens Pneumonia Continue cefepime Has lasix ordered, will continue as tolerated Planning for repeat CT scan, depending on response to lasix/may need thoracentesis for effusions   Acute Kidney Injury Creatinine peaked at 5.64 Baseline appears to be <1 (06/2021) Improving, follow with lasix   Diffuse Body Wall Edema Noted on CT 12/19  Continue lasix  Follow albumin, UA  Seizure Like Activity EEG with findings suggestive of severe to profound diffuse encephalopathy, no seizures or epileptiform discharges Currently on keppra MRI brain without acute intracranial abnormality or significant interval change Duration? Per PCCM plan to discontinue in future May need to discuss with neurology  Demand Ischemia  In the setting of presumed massive PE, s/p CPR after PEA arrest  Will need cardiology follow up outpatient   Anemia Fluctuating, relatively stable Follow iron, b12, folate, ferritin CT scan 12/19 negative for retroperitoneal hematoma  Dense Atelectasis in LUL with decreased volume in L Lung Concern for chronic obstruction of LUL bronchus - needs short term follow up CT in 3 months vs endoscopy or  PET CT  Hypertension Lasix Home meds on hold (losartan) Amlodipine was started 12/26  Hypernatremia Improved  Hypokalemia Replace and follow  DKA Continue basal bolus  regimen (on Eurika Sandy very high dose regimen at home, she's receiving Maylyn Narvaiz fraction of this currently) Adjust as needed Holding jardiance for now with recent AKI  HLD Holding crestor for now    DVT prophylaxis: heparin gtt Code Status: full Family Communication: daughter at bedside Disposition:   Status is: Inpatient Remains inpatient appropriate because: ongoing workup, need for abx, anticoagulation   Consultants:  PCCM renal  Procedures:  LE [place] Summary:  RIGHT:  - There is no evidence of deep vein thrombosis in the lower extremity.    - Macarena Langseth cystic structure is found in the popliteal fossa measuring 5.0 x 1.1 x  2.9 cm.    LEFT:  - There is no evidence of deep vein thrombosis in the lower extremity.    - No cystic structure found in the popliteal fossa.     *See table(s) above for measurements and observations.   Echo IMPRESSIONS     1. There appears to be Efrat Zuidema mobile echodensity in the RV outflow tract  (images 45-51), not well visualized but given presentation with cardiac  arrest, concerning for thrombus and recommend PE evaluation. RV function  appears mildly reduced with free wall  hypokinesis   2. Left ventricular ejection fraction, by estimation, is 65 to 70%. The  left ventricle has normal function. The left ventricle has no regional  wall motion abnormalities. There is mild left ventricular hypertrophy.  Left ventricular diastolic parameters  are consistent with Grade I diastolic dysfunction (impaired relaxation).   3. Right ventricular systolic function is mildly reduced. The right  ventricular size is normal. There is mildly elevated pulmonary artery  systolic pressure.   4. The mitral valve is normal in structure. Trivial mitral valve  regurgitation.   5. The aortic valve is tricuspid. Aortic valve regurgitation is not  visualized. No aortic stenosis is present.   6. The inferior vena cava is normal in size with greater than 50%  respiratory variability,  suggesting right atrial pressure of 3 mmHg.   Antimicrobials:  Anti-infectives (From admission, onward)    Start     Dose/Rate Route Frequency Ordered Stop   04/20/22 2200  ceFEPIme (MAXIPIME) 2 g in sodium chloride 0.9 % 100 mL IVPB        2 g 200 mL/hr over 30 Minutes Intravenous Every 12 hours 04/20/22 1107 04/23/22 2359   04/17/22 1400  ceFEPIme (MAXIPIME) 2 g in sodium chloride 0.9 % 100 mL IVPB  Status:  Discontinued        2 g 200 mL/hr over 30 Minutes Intravenous Every 24 hours 04/17/22 1311 04/20/22 1107   04/10/22 1100  Ampicillin-Sulbactam (UNASYN) 3 g in sodium chloride 0.9 % 100 mL IVPB        3 g 200 mL/hr over 30 Minutes Intravenous Every 12 hours 04/10/22 0953 04/16/22 2137   04/09/22 1430  cefTRIAXone (ROCEPHIN) 2 g in sodium chloride 0.9 % 100 mL IVPB  Status:  Discontinued        2 g 200 mL/hr over 30 Minutes Intravenous Every 24 hours 04/09/22 1333 04/10/22 0945       Subjective: No new complaints CP from CPR Daughter at bedside  Objective: Vitals:   04/21/22 0416 04/21/22 0422 04/21/22 0745 04/21/22 1030  BP:  122/63 (!) 156/81 126/64  Pulse:  72 82 91  Resp:  20   Temp:  97.7 F (36.5 C) 98.4 F (36.9 C)   TempSrc:  Oral Oral   SpO2: 100% 98% 100% 96%  Weight:  80.9 kg    Height:        Intake/Output Summary (Last 24 hours) at 04/21/2022 1110 Last data filed at 04/20/2022 1600 Gross per 24 hour  Intake --  Output 2650 ml  Net -2650 ml   Filed Weights   04/19/22 0500 04/20/22 0356 04/21/22 0422  Weight: 78.3 kg 80.2 kg 80.9 kg    Examination:  General exam: Appears calm and comfortable  Respiratory system: Clear to auscultation.  Cardiovascular system: S1 & S2 heard, RRR.  Reproducible CP to palpation. Gastrointestinal system: Abdomen is nondistended, soft and nontender. Central nervous system: Alert and oriented. No focal neurological deficits. Extremities: no LEE    Data Reviewed: I have personally reviewed following labs and  imaging studies  CBC: Recent Labs  Lab 04/19/22 0510 04/19/22 1424 04/20/22 0245 04/20/22 1222 04/21/22 0529  WBC 9.4 14.1* 10.7* 10.8* 8.5  NEUTROABS 7.4 11.6* 8.4*  --   --   HGB 7.3* 9.3* 7.6* 8.0* 7.3*  HCT 23.3* 29.0* 24.1* 25.7* 24.5*  MCV 74.7* 74.0* 74.4* 73.9* 75.9*  PLT 220 286 264 279 478    Basic Metabolic Panel: Recent Labs  Lab 04/16/22 0332 04/17/22 0339 04/18/22 0233 04/18/22 1257 04/18/22 1453 04/19/22 0510 04/20/22 0245 04/21/22 0529  NA 155* 154* 148* 147* 147* 146* 144 141  K 3.8 3.0* 3.1* 3.8 3.7 3.4* 3.3* 3.1*  CL 115* 119* 113*  --  111 112* 111 103  CO2 '24 26 23  '$ --  '25 22 25 29  '$ GLUCOSE 173* 169* 129*  --  183* 129* 148* 297*  BUN 75* 68* 56*  --  52* 43* 32* 23  CREATININE 4.70* 3.43* 2.47*  --  2.27* 2.06* 1.68* 1.43*  CALCIUM 9.1 8.6* 8.9  --  8.5* 8.2* 8.5* 8.5*  MG 2.1 1.9 1.5*  --   --  1.8 1.9 1.8  PHOS 5.0* 4.5 4.7*  --   --   --   --   --     GFR: Estimated Creatinine Clearance: 36.9 mL/min (Giancarlo Askren) (by C-G formula based on SCr of 1.43 mg/dL (H)).  Liver Function Tests: No results for input(s): "AST", "ALT", "ALKPHOS", "BILITOT", "PROT", "ALBUMIN" in the last 168 hours.  CBG: Recent Labs  Lab 04/20/22 0854 04/20/22 1223 04/20/22 1623 04/20/22 2127 04/21/22 0749  GLUCAP 110* 213* 218* 353* 275*     Recent Results (from the past 240 hour(s))  Culture, Respiratory w Gram Stain     Status: None   Collection Time: 04/16/22  9:10 AM   Specimen: Tracheal Aspirate; Respiratory  Result Value Ref Range Status   Specimen Description TRACHEAL ASPIRATE  Final   Special Requests NONE  Final   Gram Stain FEW WBC PRESENT, PREDOMINANTLY PMN RARE YEAST   Final   Culture   Final    FEW SERRATIA MARCESCENS FEW CANDIDA ALBICANS No Pseudomonas species isolated NO STAPHYLOCOCCUS AUREUS ISOLATED Performed at [facility], [address] [place], [place] [address]    Report Status 04/18/2022 FINAL  Final   Organism ID, Bacteria  SERRATIA MARCESCENS  Final      Susceptibility   Serratia marcescens - MIC*    CEFAZOLIN >=64 RESISTANT Resistant     CEFEPIME <=0.12 SENSITIVE Sensitive     CEFTAZIDIME <=1 SENSITIVE Sensitive     CEFTRIAXONE <=  0.25 SENSITIVE Sensitive     CIPROFLOXACIN <=0.25 SENSITIVE Sensitive     GENTAMICIN <=1 SENSITIVE Sensitive     TRIMETH/SULFA <=20 SENSITIVE Sensitive     * FEW SERRATIA MARCESCENS         Radiology Studies: No results found.      Scheduled Meds:  amLODipine  7.5 mg Oral Daily   Chlorhexidine Gluconate Cloth  6 each Topical Daily   feeding supplement  237 mL Oral TID WC   furosemide  40 mg Intravenous Daily   insulin aspart  0-15 Units Subcutaneous TID WC   ipratropium-albuterol  3 mL Nebulization BID   multivitamin with minerals  1 tablet Oral Daily   pantoprazole (PROTONIX) IV  40 mg Intravenous QHS   Continuous Infusions:  sodium chloride Stopped (04/17/22 1020)   ceFEPime (MAXIPIME) IV 2 g (04/21/22 1000)   heparin 1,400 Units/hr (04/21/22 0714)     LOS: 12 days    Time spent: over 30 min    Fayrene Helper, MD [facility]   To contact the attending provider between 7A-7P or the covering provider during after hours 7P-7A, please log into the web site [email or web address] and access using universal [place] password for that web site. If you do not have the password, please call the hospital operator.  04/21/2022, 11:10 AM

## 2022-04-21 NOTE — Progress Notes (Signed)
  Inpatient Rehabilitation Admissions Coordinator   Met with patient and daughter, Mardene Celeste, at bedside for rehab assessment. We discussed goals and expectations of a possible CIR admit. Patient's niece, [place], lives with patient, but works. I have asked daughter to clarify with family the 24/7 caregiver support she would likely need after a Cir admit. I have placed order for OT eval also. I will follow up tomorrow to assess if family can arrange the 24/7 caregiver supports before proceeding with Auth with [place] for a possible CIR admit. Please call me with any questions.   Danne Baxter, RN, MSN Rehab Admissions Coordinator [phone]

## 2022-04-21 NOTE — Inpatient Diabetes Management (Signed)
Inpatient Diabetes Program Recommendations  AACE/ADA: New Consensus Statement on Inpatient Glycemic Control (2015)  Target Ranges:  Prepandial:   less than 140 mg/dL      Peak postprandial:   less than 180 mg/dL (1-2 hours)      Critically ill patients:  140 - 180 mg/dL   Lab Results  Component Value Date   GLUCAP 275 (H) 04/21/2022   HGBA1C >15.5 (H) 04/12/2022    Diabetes history: DM 2 Outpatient Diabetes medications: Jardiance 25 mg daily, Toujeo 100 units daily, Humulin R U500 60 units TID with meals Current orders for Inpatient glycemic control: Novolog 0-15 units tid   Inpatient Diabetes Program Recommendations:     If patient is eating well, please consider:  Add Novolog 0-5 units QHS Novolog 2-3 units TID with meals if she consumes at least 50%  Will continue to follow while inpatient.  Thank you, Nani Gasser. Samella Lucchetti, RN, MSN, CDE  Diabetes Coordinator Inpatient Glycemic Control Team Team Pager [phone] (8am-5pm) 04/21/2022 9:11 AM

## 2022-04-22 ENCOUNTER — Inpatient Hospital Stay (HOSPITAL_COMMUNITY): Payer: Medicare PPO

## 2022-04-22 ENCOUNTER — Other Ambulatory Visit (HOSPITAL_COMMUNITY): Payer: Self-pay

## 2022-04-22 DIAGNOSIS — I469 Cardiac arrest, cause unspecified: Secondary | ICD-10-CM | POA: Diagnosis not present

## 2022-04-22 LAB — CBC
HCT: 24.9 % — ABNORMAL LOW (ref 36.0–46.0)
Hemoglobin: 7.9 g/dL — ABNORMAL LOW (ref 12.0–15.0)
MCH: 23.7 pg — ABNORMAL LOW (ref 26.0–34.0)
MCHC: 31.7 g/dL (ref 30.0–36.0)
MCV: 74.8 fL — ABNORMAL LOW (ref 80.0–100.0)
Platelets: 297 10*3/uL (ref 150–400)
RBC: 3.33 MIL/uL — ABNORMAL LOW (ref 3.87–5.11)
RDW: 14.9 % (ref 11.5–15.5)
WBC: 9.3 10*3/uL (ref 4.0–10.5)
nRBC: 0 % (ref 0.0–0.2)

## 2022-04-22 LAB — BASIC METABOLIC PANEL
Anion gap: 9 (ref 5–15)
BUN: 23 mg/dL (ref 8–23)
CO2: 29 mmol/L (ref 22–32)
Calcium: 8.5 mg/dL — ABNORMAL LOW (ref 8.9–10.3)
Chloride: 104 mmol/L (ref 98–111)
Creatinine, Ser: 1.2 mg/dL — ABNORMAL HIGH (ref 0.44–1.00)
GFR, Estimated: 49 mL/min — ABNORMAL LOW (ref >60)
Glucose, Bld: 352 mg/dL — ABNORMAL HIGH (ref 70–99)
Potassium: 3.5 mmol/L (ref 3.5–5.1)
Sodium: 142 mmol/L (ref 135–145)

## 2022-04-22 LAB — GLUCOSE, CAPILLARY
Glucose-Capillary: 312 mg/dL — ABNORMAL HIGH (ref 70–99)
Glucose-Capillary: 311 mg/dL — ABNORMAL HIGH (ref 70–99)
Glucose-Capillary: 185 mg/dL — ABNORMAL HIGH (ref 70–99)
Glucose-Capillary: 141 mg/dL — ABNORMAL HIGH (ref 70–99)

## 2022-04-22 LAB — HEPARIN LEVEL (UNFRACTIONATED)
Heparin Unfractionated: 0.83 IU/mL — ABNORMAL HIGH (ref 0.30–0.70)
Heparin Unfractionated: 0.65 IU/mL (ref 0.30–0.70)

## 2022-04-22 LAB — MAGNESIUM: Magnesium: 1.8 mg/dL (ref 1.7–2.4)

## 2022-04-22 MED ORDER — POTASSIUM CHLORIDE CRYS ER 20 MEQ PO TBCR
40.0000 meq | EXTENDED_RELEASE_TABLET | ORAL | Status: AC
  Administered 2022-04-22 (×2): 40 meq via ORAL
  Filled 2022-04-22 (×2): qty 2

## 2022-04-22 MED ORDER — MAGNESIUM SULFATE IN D5W 1-5 GM/100ML-% IV SOLN
1.0000 g | Freq: Once | INTRAVENOUS | Status: AC
  Administered 2022-04-22: 1 g via INTRAVENOUS
  Filled 2022-04-22: qty 100

## 2022-04-22 MED ORDER — INSULIN GLARGINE-YFGN 100 UNIT/ML ~~LOC~~ SOLN
10.0000 [IU] | Freq: Two times a day (BID) | SUBCUTANEOUS | Status: DC
  Administered 2022-04-22 – 2022-04-23 (×2): 10 [IU] via SUBCUTANEOUS
  Filled 2022-04-22 (×3): qty 0.1

## 2022-04-22 MED ORDER — INSULIN ASPART 100 UNIT/ML IJ SOLN
5.0000 [IU] | Freq: Three times a day (TID) | INTRAMUSCULAR | Status: DC
  Administered 2022-04-22 – 2022-04-23 (×3): 5 [IU] via SUBCUTANEOUS

## 2022-04-22 MED ORDER — IOHEXOL 350 MG/ML SOLN
75.0000 mL | Freq: Once | INTRAVENOUS | Status: AC | PRN
  Administered 2022-04-22: 75 mL via INTRAVENOUS

## 2022-04-22 NOTE — Progress Notes (Addendum)
ANTICOAGULATION CONSULT NOTE  Pharmacy Consult for heparin  Indication: pulmonary embolus and clot right ventricle  Allergies  Allergen Reactions   Shingrix [Zoster Vac Recomb Adjuvanted]     Allergy to original live zoster - rash    Patient Measurements: Height: '5\' 3"'$  (160 cm) Weight: 77.6 kg (171 lb 1.2 oz) IBW/kg (Calculated) : 52.4 Heparin Dosing Weight: 68kg  Vital Signs: Temp: 97.8 F (36.6 C) (12/28 1100) Temp Source: Oral (12/28 1100) BP: 143/68 (12/28 1100) Pulse Rate: 94 (12/28 1100)  Labs: Recent Labs    04/20/22 0245 04/20/22 1222 04/20/22 1946 04/21/22 0529 04/21/22 1221 04/21/22 1222 04/21/22 2238 04/22/22 0805 04/22/22 1922  HGB 7.6* 8.0*  --  7.3* 8.5*  --   --  7.9*  --   HCT 24.1* 25.7*  --  24.5* 28.0*  --   --  24.9*  --   PLT 264 279  --  278  --   --   --  297  --   HEPARINUNFRC 0.26*  --    < > 0.22*  --    < > 0.83* 0.83* 0.65  CREATININE 1.68*  --   --  1.43*  --   --   --  1.20*  --    < > = values in this interval not displayed.     Estimated Creatinine Clearance: 43 mL/min (A) (by C-G formula based on SCr of 1.2 mg/dL (H)).  Assessment: Pt was admitted for being unresponsive with CPR administered. Pt went into cardiac arrest and found to have thrombus in right ventricle on ECHO. TNK was administered. Heparin gtt ordered for anticoagulation post TNK.   Heparin level remains slightly supratherapeutic at 0.65. No issues with heparin infusing or bleeding noted.   Goal of Therapy:  Heparin level 0.3-0.5 units/ml Monitor platelets by anticoagulation protocol: Yes   Plan:  Reduce heparin to 1150 units/h Recheck heparin level in 8h  Dimple Nanas, PharmD, BCPS 04/22/2022 8:19 PM

## 2022-04-22 NOTE — TOC Benefit Eligibility Note (Signed)
Patient Teacher, English as a foreign language completed.    The patient is currently admitted and upon discharge could be taking Eliquis 5 mg.  The current 30 day co-pay is $10.35.   The patient is insured through [place], [place] Patient Advocate Specialist [place] Patient Advocate Team Direct Number: [phone]  Fax: [phone]

## 2022-04-22 NOTE — Progress Notes (Signed)
04/22/22 1100  Clinical Encounter Type  Visited With Patient and family together;Health care provider (NURSE: Colletta Maryland B. Carlynn Herald, RN; DAUGHTER:  Deborah Neal)  Visit Type Initial  Referral From Nurse ([place]. Carlynn Herald, RN)  Consult/Referral To Chaplain Melvenia Beam)  Recommendations Advance Directive Education  Advance Directives (For Healthcare)  Does Patient Have a Medical Advance Directive? No  Would patient like information on creating a medical advance directive? Yes (Inpatient - patient defers creating a medical advance directive at this time - Information given)  [place] Directives  Does Patient Have a Mental Health Advance Directive? No   Chaplain responded to Spiritual Care Consultation requesting assistance for Advance Directive preparation with [name]. Chaplain met with [name] and her Daughter: Deborah Neal, and Nurse Colletta Maryland B. Barnhill, RN at patient's bedside on 04/22/2022 at 1100.   [name] stated that she does NOT have a court appointed [place].  [name] stated that she is NOT Married.   [name] indicated that she intends to list her DAUGHTER: Deborah Neal,  [phone] as her [place].   Chaplain provided the Advance Directive packet as well as education on Advance Directives-documents an individual completes to communicate their health care directions in advance of a time when they may need them. Chaplain informed [name] the documents which may be completed here in the hospital are the Living Will and [place].  Chaplain informed [name] that the [place] is a legal document in which an individual names another person, as their [place], to communicate her health care decisions, when she is not able to communicate them for herself. The Health Care Agent's function can be temporary or  permanent depending on her ability to make and communicate those decisions independently.   Chaplain informed [name] in the absence of a [place], the state of [place] directs health care providers to look to the following individuals in the order listed: legal guardian; an attorney-in-fact under a general power of attorney (POA) if that POA includes the right to make health care decisions; person's spouse; a 37 of her adult children; a 39 of adult brothers and sisters; or an individual who has an established relationship with you, who is acting in good faith and who can convey your wishes.  If none of these persons are available or willing to make medical decisions on a patient's behalf, the law allows the patient's doctor to make decisions for them as long as another doctor agrees with those decisions.  Chaplain also informed the patient that the Health Care agent has no decision-making authority over any affairs other than those related to her medical care.  The chaplain further educated [name] that a Living Will is a legal document that allows her desires not to receive life-prolonging measures in the event that she has a condition that is incurable and will result in her death in a short period of time; they are unconscious, and doctors are confident that she will not regain consciousness; and/or she has advanced dementia or other substantial and irreversible loss of mental function.   The chaplain informed [name] that life-prolonging measures are medical treatments that would only serve to postpone death, including breathing machines, kidney dialysis, antibiotics, artificial nutrition and hydration (tube feeding), and similar forms of treatment and that if an individual  is able to express their wishes, they may also make them known without the use of a Living Will, but in the event that she is not able to express her wishes, a Living Will allows medical  providers and the her family and friends to ensure that they are not making decisions on her behalf, but rather serving as her voice to convey decisions that she has already made.  [name] is aware that the decision to create an advance directive is hers alone and she may choose not to complete the documents or may choose to complete one portion or both.  [name] was informed that she can revoke the documents at any time by striking through them and writing void or by completing new documents, but that it is also advisable that the individual verbally notify interested parties that her wishes have changed.  [name] is also aware that the document must be signed in the presence of a notary public and two witnesses and that this may be done while the patient is still admitted to the hospital or after discharge in the community. If they decide to complete Advance Directives after being discharged from the hospital, they have been advised to notify all interested parties and to provide those documents to their physicians and loved ones in addition to bringing them to the hospital in the event of another hospitalization.  The chaplain informed the [name] that if she desires to proceed with completing the Advance Directive Documentation while she is still admitted, notary services are typically available at [facility] between the hours of 1:00 and 3:30 Monday-Thursday.  When the patient is ready to have these documents completed, the patient should request that their nurse place a spiritual care consult and indicate that the patient is ready to have their advance directives notarized so that arrangements for witnesses and notary public can be made.  If there is an immediate need to have notarized please page the Chaplain.   Please page spiritual care if the patient desires further education or has questions.   [address] [place], M. Min., [phone].

## 2022-04-22 NOTE — Progress Notes (Signed)
Inpatient Rehabilitation Admissions Coordinator   I met at bedside with patient and her daughter, Deborah Neal. We reviewed goals and expectations of a possible Cir admit. Deborah Neal and her daughter, Jordan Hawks, can provide 24/7 caregiver supports after a CIR Admit. They prefer CIR admit.  I will begin insurance Auth with [place] for a possible admit.  Danne Baxter, RN, MSN Rehab Admissions Coordinator [phone] 04/22/2022 12:05 PM

## 2022-04-22 NOTE — Progress Notes (Signed)
Physical Therapy Treatment Patient Details Name: Deborah Neal MRN: [id number] DOB: 06/26/1950 Today's Date: 04/22/2022   History of Present Illness 71 year old female with past medical history significant for hypertension, hyperlipidemia, type 2 diabetes mellitus, and asthma.  Admitted after found at home unresponsive then with seizure and PEA arrest.  Intubated 12/15-12/23/23.  Found to have severe hypokalemia and massive PE received TNKase and heparin with some issues with hematuria and hematemesis, with CT chest positive for multifocal pneumonia.    PT Comments    Patient is cooperative and agreeable to PT. Supportive daughter at the bedside as well as MD for part of session. The patient continues to require physical assistance with mobility. She ambulated in hallway with steadying assistance and cues for improved gait kinematics and body mechanics. Activity tolerance limited by fatigue with standing rest break required. Recommend PT continue to follow to maximize independence and facilitate return to prior level of function. Acute inpatient rehab is still recommended at this time.    Recommendations for follow up therapy are one component of a multi-disciplinary discharge planning process, led by the attending physician.  Recommendations may be updated based on patient status, additional functional criteria and insurance authorization.  Follow Up Recommendations  Acute inpatient rehab (3hours/day)     Assistance Recommended at Discharge Intermittent Supervision/Assistance  Patient can return home with the following A lot of help with walking and/or transfers;A lot of help with bathing/dressing/bathroom;Assistance with cooking/housework;Assist for transportation;Help with stairs or ramp for entrance   Equipment Recommendations  Other (comment) (to be determined at next level of care)    Recommendations for Other Services       Precautions / Restrictions Precautions Precautions:  Fall Precaution Comments: HFNC Restrictions Weight Bearing Restrictions: No     Mobility  Bed Mobility Overal bed mobility: Needs Assistance Bed Mobility: Supine to Sit       Sit to supine: Min assist   General bed mobility comments: increased time and effort required    Transfers Overall transfer level: Needs assistance Equipment used: Rolling walker (2 wheels) Transfers: Sit to/from Stand Sit to Stand: Min assist           General transfer comment: lifting assistance required to stand    Ambulation/Gait Ambulation/Gait assistance: Min assist Gait Distance (Feet): 75 Feet Assistive device: Rolling walker (2 wheels) Gait Pattern/deviations: Trunk flexed, Decreased stride length Gait velocity: decreased     General Gait Details: decreased foot clearance bilaterally. cues to increase step length/height and to stand closer to rolling walker for support. steadying assistance required with ambulation as well as occasional assistance for rolling walker negotiation, expecially around obstacles and with turns. Sp02 remained in the upper 90's with ambulation on 3 L02. activity tolerance limited by fatigue and one standing rest break required   Stairs             Wheelchair Mobility    Modified Rankin (Stroke Patients Only)       Balance Overall balance assessment: Needs assistance Sitting-balance support: Feet supported Sitting balance-Leahy Scale: Fair     Standing balance support: Single extremity supported, During functional activity, Bilateral upper extremity supported, Reliant on assistive device for balance Standing balance-Leahy Scale: Poor Standing balance comment: external support required, rolling walker used                            Cognition Arousal/Alertness: Awake/alert Behavior During Therapy: Flat affect Overall Cognitive Status: Within Functional Limits for  tasks assessed                                  General Comments: slow to respond at times and needs cues for safety with mobility tasks. slow initiation as well        Exercises      General Comments General comments (skin integrity, edema, etc.): VSS on 3L O2      Pertinent Vitals/Pain Pain Assessment Pain Assessment: No/denies pain    Home Living Family/patient expects to be discharged to:: Private residence Living Arrangements: Other relatives (granddaughter) Available Help at Discharge: Family;Available PRN/intermittently Type of Home: House Home Access: Stairs to enter Entrance Stairs-Rails: Right Entrance Stairs-Number of Steps: 4   Home Layout: One level Home Equipment: None      Prior Function            PT Goals (current goals can now be found in the care plan section) Acute Rehab PT Goals Patient Stated Goal: to go home PT Goal Formulation: With patient Time For Goal Achievement: 05/02/22 Potential to Achieve Goals: Good Progress towards PT goals: Progressing toward goals    Frequency    Min 3X/week      PT Plan Current plan remains appropriate    Co-evaluation              AM-PAC PT "6 Clicks" Mobility   Outcome Measure  Help needed turning from your back to your side while in a flat bed without using bedrails?: A Lot Help needed moving from lying on your back to sitting on the side of a flat bed without using bedrails?: A Lot Help needed moving to and from a bed to a chair (including a wheelchair)?: A Lot Help needed standing up from a chair using your arms (e.g., wheelchair or bedside chair)?: A Lot Help needed to walk in hospital room?: Total Help needed climbing 3-5 steps with a railing? : Total 6 Click Score: 10    End of Session         PT Visit Diagnosis: Muscle weakness (generalized) (M62.81);Other abnormalities of gait and mobility (R26.89)     Time: 5643-3295 PT Time Calculation (min) (ACUTE ONLY): 28 min  Charges:  $Gait Training: 8-22 mins $Therapeutic  Activity: 8-22 mins                    Minna Merritts, PT, MPT    Percell Locus 04/22/2022, 1:11 PM

## 2022-04-22 NOTE — Evaluation (Signed)
Occupational Therapy Evaluation Patient Details Name: Deborah Neal MRN: [id number] DOB: Sep 02, 1950 Today's Date: 04/22/2022   History of Present Illness 71 year old female with past medical history significant for hypertension, hyperlipidemia, type 2 diabetes mellitus, and asthma.  Admitted after found at home unresponsive then with seizure and PEA arrest.  Intubated 12/15-12/23/23.  Found to have severe hypokalemia and massive PE received TNKase and heparin with some issues with hematuria and hematemesis, with CT chest positive for multifocal pneumonia.   Clinical Impression   Pt reports ind at baseline with ADLs and functional mobility, lives with  granddaughter who can assist at d/c. Pt needing min guard-mod A for ADLs, min guard for bed mobility, and min A for transfers with RW. Pt with 2/4 DOE after short distance walk to sink for ADLs, VSS on 3L O2. Pt presenting with impairments listed below, will follow acutely. Recommend AIR at d/c.     Recommendations for follow up therapy are one component of a multi-disciplinary discharge planning process, led by the attending physician.  Recommendations may be updated based on patient status, additional functional criteria and insurance authorization.   Follow Up Recommendations  Acute inpatient rehab (3hours/day)     Assistance Recommended at Discharge Intermittent Supervision/Assistance  Patient can return home with the following A little help with walking and/or transfers;A lot of help with bathing/dressing/bathroom;Assistance with cooking/housework;Direct supervision/assist for medications management;Direct supervision/assist for financial management;Assist for transportation;Help with stairs or ramp for entrance    Functional Status Assessment  Patient has had a recent decline in their functional status and demonstrates the ability to make significant improvements in function in a reasonable and predictable amount of time.  Equipment  Recommendations  Other (comment) (defer)    Recommendations for Other Services PT consult;Rehab consult     Precautions / Restrictions Precautions Precautions: Fall Precaution Comments: on HFNC Restrictions Weight Bearing Restrictions: No      Mobility Bed Mobility Overal bed mobility: Needs Assistance Bed Mobility: Sit to Supine       Sit to supine: Min guard        Transfers Overall transfer level: Needs assistance   Transfers: Sit to/from Stand Sit to Stand: Min assist                  Balance Overall balance assessment: Needs assistance Sitting-balance support: Feet supported Sitting balance-Leahy Scale: Fair     Standing balance support: Single extremity supported, During functional activity, Bilateral upper extremity supported, Reliant on assistive device for balance Standing balance-Leahy Scale: Poor                             ADL either performed or assessed with clinical judgement   ADL Overall ADL's : Needs assistance/impaired Eating/Feeding: Set up;Sitting   Grooming: Min guard;Standing   Upper Body Bathing: Moderate assistance   Lower Body Bathing: Moderate assistance   Upper Body Dressing : Minimal assistance   Lower Body Dressing: Moderate assistance   Toilet Transfer: Min guard;Rolling walker (2 wheels);Ambulation;Regular Toilet   Toileting- Clothing Manipulation and Hygiene: Maximal assistance Toileting - Clothing Manipulation Details (indicate cue type and reason): pericare     Functional mobility during ADLs: Min guard;Rolling walker (2 wheels)       Vision   Vision Assessment?: No apparent visual deficits     Perception Perception Perception Tested?: No   Praxis Praxis Praxis tested?: Not tested    Pertinent Vitals/Pain Pain Assessment Pain Assessment: No/denies pain  Hand Dominance Right   Extremity/Trunk Assessment Upper Extremity Assessment Upper Extremity Assessment: Generalized  weakness   Lower Extremity Assessment Lower Extremity Assessment: Defer to PT evaluation   Cervical / Trunk Assessment Cervical / Trunk Assessment: Normal   Communication Communication Communication: No difficulties   Cognition Arousal/Alertness: Awake/alert Behavior During Therapy: WFL for tasks assessed/performed Overall Cognitive Status: Within Functional Limits for tasks assessed                                 General Comments: pt's daughter reports she seems back to normal but is not talking alot     General Comments  VSS on 3L O2    Exercises     Shoulder Instructions      Home Living Family/patient expects to be discharged to:: Private residence Living Arrangements: Other relatives (granddaughter) Available Help at Discharge: Family;Available PRN/intermittently Type of Home: House Home Access: Stairs to enter [employer] of Steps: 4 Entrance Stairs-Rails: Right Home Layout: One level         Bathroom Toilet: Standard     Home Equipment: None          Prior Functioning/Environment Prior Level of Function : Independent/Modified Independent             Mobility Comments: reports no AD use ADLs Comments: cooks and cleans, does not drive        OT Problem List: Decreased strength;Decreased range of motion;Decreased activity tolerance;Decreased safety awareness      OT Treatment/Interventions: Self-care/ADL training;Therapeutic exercise;Energy conservation;DME and/or AE instruction;Therapeutic activities;Patient/family education;Balance training;Cognitive remediation/compensation    OT Goals(Current goals can be found in the care plan section) Acute Rehab OT Goals Patient Stated Goal: none stated OT Goal Formulation: With patient Time For Goal Achievement: 05/06/22 Potential to Achieve Goals: Good ADL Goals Pt Will Perform Upper Body Dressing: with min guard assist;sitting;standing Pt Will Perform Lower Body Dressing:  with min guard assist;sitting/lateral leans;sit to/from stand Pt Will Transfer to Toilet: with supervision;ambulating;regular height toilet  OT Frequency: Min 2X/week    Co-evaluation              AM-PAC OT "6 Clicks" Daily Activity     Outcome Measure Help from another person eating meals?: None Help from another person taking care of personal grooming?: A Little Help from another person toileting, which includes using toliet, bedpan, or urinal?: A Lot Help from another person bathing (including washing, rinsing, drying)?: A Lot Help from another person to put on and taking off regular upper body clothing?: A Little Help from another person to put on and taking off regular lower body clothing?: A Lot 6 Click Score: 16   End of Session Equipment Utilized During Treatment: Gait belt;Rolling walker (2 wheels);Oxygen Nurse Communication: Mobility status  Activity Tolerance: Patient tolerated treatment well Patient left: in bed;with call bell/phone within reach;with bed alarm set;with family/visitor present  OT Visit Diagnosis: Unsteadiness on feet (R26.81);Other abnormalities of gait and mobility (R26.89);Muscle weakness (generalized) (M62.81)                Time: 4235-3614 OT Time Calculation (min): 31 min Charges:  OT General Charges $OT Visit: 1 Visit OT Evaluation $OT Eval Moderate Complexity: 1 Mod OT Treatments $Self Care/Home Management : 8-22 mins  Renaye Rakers, OTD, OTR/L [employer] Preferred Acute Rehab [phone]  Renaye Rakers Koonce 04/22/2022, 11:16 AM

## 2022-04-22 NOTE — Progress Notes (Addendum)
ANTICOAGULATION CONSULT NOTE Pharmacy Consult for heparin  Indication: pulmonary embolus and RV thrombus Brief A/P: Heparin level supratherapeutic Decrease Heparin rate  Allergies  Allergen Reactions   Shingrix [Zoster Vac Recomb Adjuvanted]     Allergy to original live zoster - rash    Patient Measurements: Height: '5\' 3"'$  (160 cm) Weight: 80.9 kg (178 lb 5.6 oz) IBW/kg (Calculated) : 52.4 Heparin Dosing Weight: 68kg  Vital Signs: Temp: 98.3 F (36.8 C) (12/27 2122) Temp Source: Oral (12/27 2122) BP: 142/72 (12/27 2122) Pulse Rate: 94 (12/27 2122)  Labs: Recent Labs    04/19/22 0510 04/19/22 1424 04/20/22 0245 04/20/22 1222 04/20/22 1946 04/21/22 0529 04/21/22 1221 04/21/22 1222 04/21/22 2238  HGB 7.3*   < > 7.6* 8.0*  --  7.3* 8.5*  --   --   HCT 23.3*   < > 24.1* 25.7*  --  24.5* 28.0*  --   --   PLT 220   < > 264 279  --  278  --   --   --   HEPARINUNFRC 0.24*   < > 0.26*  --    < > 0.22*  --  0.13* 0.83*  CREATININE 2.06*  --  1.68*  --   --  1.43*  --   --   --    < > = values in this interval not displayed.     Estimated Creatinine Clearance: 36.9 mL/min (A) (by C-G formula based on SCr of 1.43 mg/dL (H)).  Assessment: 71 y.o. female with PE and RV thrombus for heparin  Goal of Therapy:  Heparin level 0.3-0.5 units/ml Monitor platelets by anticoagulation protocol: Yes   Plan:  Decrease Heparin 1450 units/hr Check heparin level in 8 hours.  Phillis Knack, PharmD, BCPS  04/22/2022

## 2022-04-22 NOTE — Progress Notes (Signed)
PROGRESS NOTE    Deborah Neal  ZOX:[id number] DOB: 08/13/1950 DOA: 04/09/2022 PCP: Biagio Borg, MD  Chief Complaint  Patient presents with   CPR    Brief Narrative:  71 year old female with past medical history as below, which is significant for hypertension, hyperlipidemia, type 2 diabetes mellitus, and asthma. She was in her usual state of health until approximately 5:30 AM on 12/15 when she was seen by granddaughter. Family attempted to contact her via phone around 7 AM and she did not answer. When they later went to check on her around 10 AM she was unresponsive in her bed with red emesis seen around her. Upon arrival to the fire department the patient was unresponsive but did have pulse and respirations. Then suffered seizure. Upon EMS arrival patient lost pulses and CPR was initiated. CPR less than 5 minutes. Patient demonstrated seizure-like activity en route to [facility] emergency department. Improved with benzodiazepine administration. Upon arrival to emergency department the patient was intubated. She remained unresponsive at the time PCCM was asked to admit.   Significant Events 12/15 admit to [place] s/p cardiac arrest 12/17 MRI - neg acute, stable moderate atrophy and white matter disease. This likely reflects the sequela of chronic microvascular ischemia. 12/20-weaning well, no cuff leak. Steroids started.  12/20 not tolerating SBT, rapid rate.  12/22 Heparin stopped 2/2 hematuria 12/23>> Extubated 12/24 >> Heparin restarted , at Deborah Neal lower therapeutic range        12/25 HGB from from 8.8 to 7.3 after heparin resumed 12/25   Assessment & Plan:   Principal Problem:   Cardiac arrest [facility]) Active Problems:   Acute respiratory failure with hypoxia (HCC)   Lactic acidosis   Seizure ([place])   Type 2 diabetes mellitus without complication, with long-term current use of insulin (HCC)   Shock ([place])   Encephalopathy acute  Out of Hospital PEA Cardiac Arrest In hospital  Vfib/vtach arrest in setting of severe hypokalemia Presumed Massive Pulmonary Embolism s/p TNKase Completed normothermia protocol Continue heparin gtt (concerns given anemia she may need IVC filter) (s/p TNKase) LE [place] negative for DVT Echo with findings concerning for clot in transit, RV function mildly reduced with free wall hypokinesis, EF 65-70% no RWMA, grade 1 diastolic dysfunction CT PE protocol with PE with very small thrombus burden (this was on 12/28)  Acute Hypoxic Respiratory Failure  Multifocal Pneumonia  Aspiration Pneumonia  Bilateral Pleural Effusions Serratia Marcescens Pneumonia Continue cefepime Has lasix ordered, will continue as tolerated Planning for repeat CT scan (with patchy infiltrates in both lungs, decrease in amount of bilateral effusions), depending on response to lasix/may need thoracentesis for effusions   Acute Kidney Injury Creatinine peaked at 5.64 Baseline appears to be <1 (06/2021) Improving, follow with lasix   Diffuse Body Wall Edema Noted on CT 12/19  Continue lasix  Follow albumin 2.6, UA without protein  Seizure Like Activity EEG with findings suggestive of severe to profound diffuse encephalopathy, no seizures or epileptiform discharges Currently on keppra MRI brain without acute intracranial abnormality or significant interval change Duration? Per PCCM plan to discontinue in future May need to discuss with neurology  [place] Ischemia  In the setting of presumed massive PE, s/p CPR after PEA arrest  Will need cardiology follow up   Anemia Fluctuating, relatively stable Follow iron, b12, folate, ferritin CT scan 12/19 negative for retroperitoneal hematoma  Dense Atelectasis in LUL with decreased volume in L Lung Concern for chronic obstruction of LUL bronchus - needs  short term follow up CT in 3 months vs endoscopy or PET CT  Hypertension Lasix Home meds on hold (losartan) Amlodipine was started  12/26  Hypernatremia Improved  Hypokalemia Replace and follow  DKA Continue basal bolus regimen (on Deborah Neal very high dose regimen at home, she's receiving Deborah Neal fraction of this currently) Adjust as needed Holding jardiance for now with recent AKI  HLD Holding crestor for now    DVT prophylaxis: heparin gtt Code Status: full Family Communication: daughter at bedside Disposition:   Status is: Inpatient Remains inpatient appropriate because: ongoing workup, need for abx, anticoagulation   Consultants:  PCCM renal  Procedures:  LE [place] Summary:  RIGHT:  - There is no evidence of deep vein thrombosis in the lower extremity.    - Deborah Neal cystic structure is found in the popliteal fossa measuring 5.0 x 1.1 x  2.9 cm.    LEFT:  - There is no evidence of deep vein thrombosis in the lower extremity.    - No cystic structure found in the popliteal fossa.     *See table(s) above for measurements and observations.   Echo IMPRESSIONS     1. There appears to be Deborah Neal mobile echodensity in the RV outflow tract  (images 45-51), not well visualized but given presentation with cardiac  arrest, concerning for thrombus and recommend PE evaluation. RV function  appears mildly reduced with free wall  hypokinesis   2. Left ventricular ejection fraction, by estimation, is 65 to 70%. The  left ventricle has normal function. The left ventricle has no regional  wall motion abnormalities. There is mild left ventricular hypertrophy.  Left ventricular diastolic parameters  are consistent with Grade I diastolic dysfunction (impaired relaxation).   3. Right ventricular systolic function is mildly reduced. The right  ventricular size is normal. There is mildly elevated pulmonary artery  systolic pressure.   4. The mitral valve is normal in structure. Trivial mitral valve  regurgitation.   5. The aortic valve is tricuspid. Aortic valve regurgitation is not  visualized. No aortic stenosis is present.    6. The inferior vena cava is normal in size with greater than 50%  respiratory variability, suggesting right atrial pressure of 3 mmHg.   Antimicrobials:  Anti-infectives (From admission, onward)    Start     Dose/Rate Route Frequency Ordered Stop   04/20/22 2200  ceFEPIme (MAXIPIME) 2 g in sodium chloride 0.9 % 100 mL IVPB        2 g 200 mL/hr over 30 Minutes Intravenous Every 12 hours 04/20/22 1107 04/23/22 2359   04/17/22 1400  ceFEPIme (MAXIPIME) 2 g in sodium chloride 0.9 % 100 mL IVPB  Status:  Discontinued        2 g 200 mL/hr over 30 Minutes Intravenous Every 24 hours 04/17/22 1311 04/20/22 1107   04/10/22 1100  Ampicillin-Sulbactam (UNASYN) 3 g in sodium chloride 0.9 % 100 mL IVPB        3 g 200 mL/hr over 30 Minutes Intravenous Every 12 hours 04/10/22 0953 04/16/22 2137   04/09/22 1430  cefTRIAXone (ROCEPHIN) 2 g in sodium chloride 0.9 % 100 mL IVPB  Status:  Discontinued        2 g 200 mL/hr over 30 Minutes Intravenous Every 24 hours 04/09/22 1333 04/10/22 0945       Subjective: No new complaints today  Objective: Vitals:   04/22/22 0426 04/22/22 0729 04/22/22 0800 04/22/22 1100  BP: 126/71 (!) 157/75  (!) 143/68  Pulse: 79 88  94  Resp: 18 15    Temp: 98.1 F (36.7 C) 98.2 F (36.8 C)  97.8 F (36.6 C)  TempSrc: Oral Oral  Oral  SpO2: 98% 98% 98% 94%  Weight: 77.6 kg     Height:        Intake/Output Summary (Last 24 hours) at 04/22/2022 1655 Last data filed at 04/22/2022 1200 Gross per 24 hour  Intake 897.29 ml  Output 925 ml  Net -27.71 ml   Filed Weights   04/20/22 0356 04/21/22 0422 04/22/22 0426  Weight: 80.2 kg 80.9 kg 77.6 kg    Examination:  General: No acute distress.  Sitting up on edge of bed, getting ready to work with therapy. Cardiovascular: RRR Lungs: diminished Abdomen: Soft, nontender, nondistended Neurological: Alert and oriented 3. Moves all extremities 4 with equal strength. Cranial nerves II through XII grossly  intact. Extremities: No clubbing or cyanosis. No edema.   Data Reviewed: I have personally reviewed following labs and imaging studies  CBC: Recent Labs  Lab 04/19/22 0510 04/19/22 1424 04/20/22 0245 04/20/22 1222 04/21/22 0529 04/21/22 1221 04/22/22 0805  WBC 9.4 14.1* 10.7* 10.8* 8.5  --  9.3  NEUTROABS 7.4 11.6* 8.4*  --   --   --   --   HGB 7.3* 9.3* 7.6* 8.0* 7.3* 8.5* 7.9*  HCT 23.3* 29.0* 24.1* 25.7* 24.5* 28.0* 24.9*  MCV 74.7* 74.0* 74.4* 73.9* 75.9*  --  74.8*  PLT 220 286 264 279 278  --  361    Basic Metabolic Panel: Recent Labs  Lab 04/16/22 0332 04/17/22 0339 04/18/22 0233 04/18/22 1257 04/18/22 1453 04/19/22 0510 04/20/22 0245 04/21/22 0529 04/22/22 0805  NA 155* 154* 148*   < > 147* 146* 144 141 142  K 3.8 3.0* 3.1*   < > 3.7 3.4* 3.3* 3.1* 3.5  CL 115* 119* 113*  --  111 112* 111 103 104  CO2 '24 26 23  '$ --  '25 22 25 29 29  '$ GLUCOSE 173* 169* 129*  --  183* 129* 148* 297* 352*  BUN 75* 68* 56*  --  52* 43* 32* 23 23  CREATININE 4.70* 3.43* 2.47*  --  2.27* 2.06* 1.68* 1.43* 1.20*  CALCIUM 9.1 8.6* 8.9  --  8.5* 8.2* 8.5* 8.5* 8.5*  MG 2.1 1.9 1.5*  --   --  1.8 1.9 1.8 1.8  PHOS 5.0* 4.5 4.7*  --   --   --   --   --   --    < > = values in this interval not displayed.    GFR: Estimated Creatinine Clearance: 43 mL/min (Deborah Neal) (by C-G formula based on SCr of 1.2 mg/dL (H)).  Liver Function Tests: Recent Labs  Lab 04/21/22 1222  ALBUMIN 2.6*    CBG: Recent Labs  Lab 04/21/22 1128 04/21/22 1603 04/21/22 2139 04/22/22 0730 04/22/22 1224  GLUCAP 328* 286* 384* 312* 311*     Recent Results (from the past 240 hour(s))  Culture, Respiratory w Gram Stain     Status: None   Collection Time: 04/16/22  9:10 AM   Specimen: Tracheal Aspirate; Respiratory  Result Value Ref Range Status   Specimen Description TRACHEAL ASPIRATE  Final   Special Requests NONE  Final   Gram Stain FEW WBC PRESENT, PREDOMINANTLY PMN RARE YEAST   Final   Culture    Final    FEW SERRATIA MARCESCENS FEW CANDIDA ALBICANS No Pseudomonas species isolated NO STAPHYLOCOCCUS AUREUS ISOLATED Performed  at [facility], [place] [address], [place], [place] [address]    Report Status 04/18/2022 FINAL  Final   Organism ID, Bacteria SERRATIA MARCESCENS  Final      Susceptibility   Serratia marcescens - MIC*    CEFAZOLIN >=64 RESISTANT Resistant     CEFEPIME <=0.12 SENSITIVE Sensitive     CEFTAZIDIME <=1 SENSITIVE Sensitive     CEFTRIAXONE <=0.25 SENSITIVE Sensitive     CIPROFLOXACIN <=0.25 SENSITIVE Sensitive     GENTAMICIN <=1 SENSITIVE Sensitive     TRIMETH/SULFA <=20 SENSITIVE Sensitive     * FEW SERRATIA MARCESCENS         Radiology Studies: CT Angio Chest Pulmonary Embolism (PE) W or WO Contrast  Addendum Date: 04/22/2022   ADDENDUM REPORT: 04/22/2022 15:42 ADDENDUM: Imaging finding of small thrombus burden PE in right lower lobe and right heart dysfunction were relayed to Dr. Florene Glen by telephone call. Electronically Signed   By: Elmer Picker M.D.   On: 04/22/2022 15:42   Result Date: 04/22/2022 CLINICAL DATA:  Cardiac arrest, abnormal echocardiogram EXAM: CT ANGIOGRAPHY CHEST WITH CONTRAST TECHNIQUE: Multidetector CT imaging of the chest was performed using the standard protocol during bolus administration of intravenous contrast. Multiplanar CT image reconstructions and MIPs were obtained to evaluate the vascular anatomy. RADIATION DOSE REDUCTION: This exam was performed according to the departmental dose-optimization program which includes automated exposure control, adjustment of the mA and/or kV according to patient size and/or use of iterative reconstruction technique. CONTRAST:  19m OMNIPAQUE IOHEXOL 350 MG/ML SOLN COMPARISON:  Noncontrast CT chest done on 04/16/2022 and chest radiographs done on 04/19/2022 FINDINGS: Cardiovascular: There are no intraluminal filling defects in central pulmonary artery branches. In image 571of series 5,  filling defect is seen in is subsegmental pulmonary artery branch in the posterior right lower lung field. Motion artifacts limit evaluation of small peripheral pulmonary artery branches. Coronary artery calcifications are seen. There is homogeneous enhancement in thoracic aorta. There are scattered calcifications in thoracic aorta and its major branches. Small pericardial effusion is present. RV LV ratio is 1.3. Mediastinum/Nodes: There are enlarged lymph nodes in both hilar regions and mediastinum with no interval change. Lungs/Pleura: There is interval removal of endotracheal tube and enteric tube. There is interval improvement in aeration in both mid and lower lung fields. There is interval worsening of infiltrates in right upper lobe. There is linear focus of dense infiltrate in the medial left upper lobe, possibly chronic atelectasis. Residual patchy infiltrates are noted in right lower lobe. Small to moderate bilateral pleural effusions are seen, more so on the left side. There is interval decrease in amount of bilateral pleural effusions. There is no pneumothorax. Upper Abdomen: Surgical clips are seen in gallbladder fossa. There is 5 mm calcific density in the upper pole of right kidney. Musculoskeletal: No acute findings are seen. Review of the MIP images confirms the above findings. IMPRESSION: There is intraluminal filling defect in small subsegmental branch in right lower lobe suggesting pulmonary embolism with very small thrombus burden. No other definite intraluminal filling defects are seen. RV LV ratio is 1.3 which may suggest chronic right heart dysfunction. Small pericardial effusion is present. Patchy infiltrates are seen in both lungs with areas of improvement and areas of worsening since 04/16/2022. Findings suggest multifocal pneumonia. There is dense atelectasis in the medial left upper lobe which has not changed significantly. There is interval decrease in amount of bilateral pleural  effusions. Coronary artery disease.  Right renal calculus.  Other findings as described in the body of the report. Electronically Signed: By: Elmer Picker M.D. On: 04/22/2022 15:32        Scheduled Meds:  amLODipine  7.5 mg Oral Daily   Chlorhexidine Gluconate Cloth  6 each Topical Daily   feeding supplement  237 mL Oral TID WC   furosemide  40 mg Intravenous Daily   insulin aspart  0-15 Units Subcutaneous TID WC   insulin aspart  3 Units Subcutaneous TID WC   insulin glargine-yfgn  5 Units Subcutaneous BID   ipratropium-albuterol  3 mL Nebulization BID   multivitamin with minerals  1 tablet Oral Daily   pantoprazole (PROTONIX) IV  40 mg Intravenous QHS   Continuous Infusions:  sodium chloride Stopped (04/17/22 1020)   ceFEPime (MAXIPIME) IV Stopped (04/22/22 0906)   heparin 1,300 Units/hr (04/22/22 1128)     LOS: 13 days    Time spent: over 30 min    Fayrene Helper, MD [facility]   To contact the attending provider between 7A-7P or the covering provider during after hours 7P-7A, please log into the web site [email or web address] and access using universal [place] password for that web site. If you do not have the password, please call the hospital operator.  04/22/2022, 4:55 PM

## 2022-04-22 NOTE — PMR Pre-admission (Shared)
PMR Admission Coordinator Pre-Admission Assessment  Patient: Deborah Neal is an 71 y.o., female MRN: [id number] DOB: [birth year] Height: _0  (160 cm) Weight: 77.7 kg  Insurance Information HMO:     PPO:      PCP:      IPA:      80/20:      OTHER:  PRIMARY: [employer]       Policy#: [id number]      Subscriber: pt CM Name: Armando Gang      Phone#: [phone] ext [phone]     Fax#: [phone] Pre-Cert#: [id number] approved for 7 days f/u with Luster Landsberg phone [phone] ext [phone] fax [phone]   Employer:  Benefits:  Phone #: [phone]     Name: 12/28 Eff. Date: 04/26/21 Deduct: none      Out of Pocket Max: $4000 CIR: $160 co pay per day days 1 until 10      SNF: no co pay per day days 21 until 100 Outpatient: $20 per visit     Co-Pay: visits per medical neccesity Home Health: 100%      Co-Pay: visits per medical neccesity DME: 80%     Co-Pay: 20% Providers: in network  SECONDARY: none  Financial Counselor:       Phone#:   The Engineer, petroleum" for patients in Inpatient Rehabilitation Facilities with attached "Privacy Act [place] Records" was provided and verbally reviewed with: Patient and Family  Emergency Contact Information Contact Information     Name Relation Home Work Mobile   Griffin,Margaret Daughter [phone]  [phone]   Rankin,Patrica Daughter [phone]     Huff,Sandy Daughter [phone]        Current Medical History  Patient Admitting Diagnosis: cardiac arrest  History of Present Illness: 71 year old female with history of asthma, HTN, HLD and type 2 DM presented on 04/09/22 after she was found unresponsive at home. Witnessed tonic-clonic seizure followed by lost pulse and intubated.  VFib/Vtach arrest felt secondary of severe hypokalemia. Presumed massive PE s/p TNKase. Placed on Heparin gtt. LE [place] negative for DVT. Echo with findings concerning for clot in transit, RV function mildly reduced with free  wall hypokinesis, EF 65 to 70% no RWMA, grade 1 diastolic dysfunction. Eventually extubated 12/23.Placed on Cefepime for aspiration PNA with bilateral pleural effusions and serratia marcescens PNA. On lasix for diureses . AKI with creatinine peaked at 5.64. Improving and to monitor on lasix. Diffuse body wall edema noted on CT 12/19. Continue lasix and follow albumin. EEG findings suggestive of severe to profound diffuse encephalopathy, no seizures or epileptiform discharged. Currently on Keppra. MRI brain negative.  Dense atelectasis in LUL with decreased volume in Left lung. Concern for chronic obstruction of LUL bronchus and needs short term follow up CT in 3 months vs endoscopy or PET CT. Amlodipine started on 12/26.DKA to continue basal bolus regimen. Holding Jardiance for AKI.   Cardiology consulted on 12/29 and felt no workup needed for coronary ischemia. Felt presentation related to the PE. Will repeat limited echo 12/29  results pending to follow up on small pericardial effusion done 14 days prior. Heparin gtt discontinued 12/29 and transitioned to Eliquis.  Patient's medical record from [facility] has been reviewed by the rehabilitation admission coordinator and physician.  Past Medical History  Past Medical History:  Diagnosis Date   ALLERGIC RHINITIS 01/29/2007   Arthritis    ASTHMA 01/29/2007   ASTHMA, WITH ACUTE EXACERBATION 06/24/2008   BACK PAIN 01/29/2009   CHEST  PAIN 06/24/2008   Family history of adverse reaction to anesthesia    Patients daughter has N/V after anesthesia   GERD (gastroesophageal reflux disease)    History of shingles    HYPERLIPIDEMIA 01/29/2007   HYPERTENSION 01/29/2007   OSTEOPENIA 10/16/2007   Type II or unspecified type diabetes mellitus without mention of complication, uncontrolled 11/29/2013   Has the patient had major surgery during 100 days prior to admission? No  Family History   family history includes Asthma in her daughter and father; Cancer in  her mother; Diabetes in her father; Heart disease in her father; Hypertension in her father.  Current Medications  Current Facility-Administered Medications:    0.9 %  sodium chloride infusion, 250 mL, Intravenous, Continuous, Jacky Kindle, MD, Stopped at 04/17/22 1020   acetaminophen (TYLENOL) tablet 650 mg, 650 mg, Oral, Q4H PRN, 650 mg at 04/23/22 0248 **OR** acetaminophen (TYLENOL) 160 MG/5ML solution 650 mg, 650 mg, Per Tube, Q4H PRN, 650 mg at 04/19/22 1655 **OR** acetaminophen (TYLENOL) suppository 650 mg, 650 mg, Rectal, Q4H PRN, Corey Harold, NP   albuterol (PROVENTIL) (2.5 MG/3ML) 0.083% nebulizer solution 2.5 mg, 2.5 mg, Nebulization, Q2H PRN, Corey Harold, NP, 2.5 mg at 04/20/22 0202   amLODipine (NORVASC) tablet 7.5 mg, 7.5 mg, Oral, Daily, Elie Confer, Sarah F, NP, 7.5 mg at 04/23/22 0845   apixaban (ELIQUIS) tablet 5 mg, 5 mg, Oral, BID, Einar Grad, RPH, 5 mg at 04/23/22 1354   artificial tears (LACRILUBE) ophthalmic ointment, , Both Eyes, Q4H PRN, Jacky Kindle, MD, Given at 04/11/22 0811   ceFEPIme (MAXIPIME) 2 g in sodium chloride 0.9 % 100 mL IVPB, 2 g, Intravenous, Q12H, Candee Furbish, MD, Last Rate: 200 mL/hr at 04/23/22 0855, 2 g at 04/23/22 0855   Chlorhexidine Gluconate Cloth 2 % PADS 6 each, 6 each, Topical, Daily, Olalere, Adewale A, MD, 6 each at 04/22/22 2146   docusate (COLACE) 50 MG/5ML liquid 100 mg, 100 mg, Per Tube, BID PRN, Corey Harold, NP   feeding supplement (ENSURE ENLIVE / ENSURE PLUS) liquid 237 mL, 237 mL, Oral, TID WC, Candee Furbish, MD, 237 mL at 04/22/22 1246   furosemide (LASIX) injection 40 mg, 40 mg, Intravenous, Daily, Candee Furbish, MD, 40 mg at 04/23/22 0846   insulin aspart (novoLOG) injection 0-15 Units, 0-15 Units, Subcutaneous, TID WC, Candee Furbish, MD, 5 Units at 04/23/22 1215   insulin aspart (novoLOG) injection 8 Units, 8 Units, Subcutaneous, TID WC, Florene Glen, A Clint Lipps., MD   insulin glargine-yfgn [facility]) injection  18 Units, 18 Units, Subcutaneous, BID, Florene Glen, A Clint Lipps., MD   lip balm (BLISTEX) ointment, , Topical, PRN, Jacky Kindle, MD, Given at 04/11/22 1233   magnesium oxide (MAG-OX) tablet 400 mg, 400 mg, Oral, Daily, Elodia Florence., MD, 400 mg at 04/23/22 0846   multivitamin with minerals tablet 1 tablet, 1 tablet, Oral, Daily, Candee Furbish, MD, 1 tablet at 04/23/22 0845   ondansetron (ZOFRAN) injection 4 mg, 4 mg, Intravenous, Q6H PRN, Corey Harold, NP   Oral care mouth rinse, 15 mL, Mouth Rinse, PRN, Corey Harold, NP   pantoprazole (PROTONIX) injection 40 mg, 40 mg, Intravenous, QHS, Corey Harold, NP, 40 mg at 04/22/22 2145   phosphorus (K PHOS NEUTRAL) tablet 500 mg, 500 mg, Oral, TID AC & HS, Elodia Florence., MD, 500 mg at 04/23/22 1214   polyethylene glycol (MIRALAX / GLYCOLAX) packet 17 g, 17 g, Per Tube, Daily  PRN, Corey Harold, NP  Patients Current Diet:  Diet Order             Diet Carb Modified Fluid consistency: Thin; Room service appropriate? Yes  Diet effective now                  Precautions / Restrictions Precautions Precautions: Fall Precaution Comments: HFNC Restrictions Weight Bearing Restrictions: No   Has the patient had 2 or more falls or a fall with injury in the past year? No  Prior Activity Level Community (5-7x/wk): independent, did not drive  Prior Functional Level Self Care: Did the patient need help bathing, dressing, using the toilet or eating? Independent  Indoor Mobility: Did the patient need assistance with walking from room to room (with or without device)? Independent  Stairs: Did the patient need assistance with internal or external stairs (with or without device)? Independent  Functional Cognition: Did the patient need help planning regular tasks such as shopping or remembering to take medications? Independent  Patient Information Are you of Hispanic, Latino/a,or Spanish origin?: A. No, not of Hispanic,  Latino/a, or Spanish origin What is your race?: B. Black or African American Do you need or want an interpreter to communicate with a doctor or health care staff?: 0. No  Patient's Response To:  Health Literacy and Transportation Is the patient able to respond to health literacy and transportation needs?: Yes Health Literacy - How often do you need to have someone help you when you read instructions, pamphlets, or other written material from your doctor or pharmacy?: Never In the past 12 months, has lack of transportation kept you from medical appointments or from getting medications?: No In the past 12 months, has lack of transportation kept you from meetings, work, or from getting things needed for daily living?: No  Home Assistive Devices / Equipment Home Equipment: None  Prior Device Use: Indicate devices/aids used by the patient prior to current illness, exacerbation or injury? None of the above  Current Functional Level Cognition  Overall Cognitive Status: Within Functional Limits for tasks assessed Orientation Level: Oriented X4 General Comments: slow to respond at times and needs cues for safety with mobility tasks. slow initiation as well    Extremity Assessment (includes Sensation/Coordination)  Upper Extremity Assessment: Generalized weakness  Lower Extremity Assessment: Defer to PT evaluation    ADLs  Overall ADL's : Needs assistance/impaired Eating/Feeding: Set up, Sitting Grooming: Min guard, Standing Upper Body Bathing: Moderate assistance Lower Body Bathing: Moderate assistance Upper Body Dressing : Minimal assistance Lower Body Dressing: Moderate assistance Toilet Transfer: Min guard, Rolling walker (2 wheels), Ambulation, Regular Toilet Toileting- Clothing Manipulation and Hygiene: Maximal assistance Toileting - Clothing Manipulation Details (indicate cue type and reason): pericare Functional mobility during ADLs: Min guard, Rolling walker (2 wheels)     Mobility  Overal bed mobility: Needs Assistance Bed Mobility: Supine to Sit Supine to sit: HOB elevated, Min assist Sit to supine: Min assist General bed mobility comments: increased time and effort required    Transfers  Overall transfer level: Needs assistance Equipment used: Rolling walker (2 wheels) Transfers: Sit to/from Stand Sit to Stand: Min assist Bed to/from chair/wheelchair/BSC transfer type:: Stand pivot Stand pivot transfers: Mod assist General transfer comment: lifting assistance required to stand    Ambulation / Gait / Stairs / Wheelchair Mobility  Ambulation/Gait Ambulation/Gait assistance: Herbalist (Feet): 75 Feet Assistive device: Rolling walker (2 wheels) Gait Pattern/deviations: Trunk flexed, Decreased stride length General Gait Details:  decreased foot clearance bilaterally. cues to increase step length/height and to stand closer to rolling walker for support. steadying assistance required with ambulation as well as occasional assistance for rolling walker negotiation, expecially around obstacles and with turns. Sp02 remained in the upper 90's with ambulation on 3 L02. activity tolerance limited by fatigue and one standing rest break required Gait velocity: decreased Gait velocity interpretation: <1.31 ft/sec, indicative of household ambulator Pre-gait activities: marching in place    Posture / Balance Dynamic Sitting Balance Sitting balance - Comments: when feet not supported needing UE support Balance Overall balance assessment: Needs assistance Sitting-balance support: Feet supported Sitting balance-Leahy Scale: Fair Sitting balance - Comments: when feet not supported needing UE support Standing balance support: Single extremity supported, During functional activity, Bilateral upper extremity supported, Reliant on assistive device for balance Standing balance-Leahy Scale: Poor Standing balance comment: external support required, rolling  walker used    Special needs/care consideration    Previous Home Environment  Living Arrangements:  (grand daughter, [place], lives with patient. Margaret's daughter is [place])  Lives With: Other (Comment) (granddaughter, [place], lives with patient) Available Help at Discharge: Family, Available 24 hours/day Joycelyn Schmid and [place] to provide 24/7 care) Type of Home: House Home Layout: One level Home Access: Stairs to enter Entrance Stairs-Rails: Right Entrance Stairs-Number of Steps: 4 Bathroom Shower/Tub: Chiropodist: Standard Bathroom Accessibility: Yes How Accessible: Accessible via walker [place]: No Additional Comments: Ive explained  to family that patient does not have medicaid  to provide PCS services; Joycelyn Schmid to reapply  Discharge Living Setting Plans for Discharge Living Setting: Patient's home, Other (Comment) ([place] lives with patient, granddaughter) Type of Home at Discharge: House Discharge Home Layout: One level Discharge Home Access: Stairs to enter Entrance Stairs-Rails: Right Entrance Stairs-Number of Steps: 4 Discharge Bathroom Shower/Tub: [place] unit Discharge Bathroom Toilet: Standard Discharge [place] Accessibility: Yes How Accessible: Accessible via walker Does the patient have any problems obtaining your medications?: No  Social/Family/Support Systems Patient Roles: Parent Contact Information: Joycelyn Schmid is primary contact Anticipated Caregiver: Therapist, art and [place] Anticipated Ambulance person Information: see contacts Caregiver Availability: 24/7 Discharge Plan Discussed with Primary Caregiver: Yes Is Caregiver In Agreement with Plan?: Yes Does Caregiver/Family have Issues with Lodging/Transportation while Pt is in Rehab?: Yes  Goals Patient/Family Goal for Rehab: Mod I to supervision with PT and OT Expected length of stay: ELOS 10 to 14 days Pt/Family Agrees to Admission and willing to  participate: Yes Program Orientation Provided & Reviewed with Pt/Caregiver Including Roles  & Responsibilities: Yes  Decrease burden of Care through IP rehab admission: n/a  Possible need for SNF placement upon discharge: not anticipated  Patient Condition: I have reviewed medical records from [facility], spoken with CM, and patient and daughter. I met with patient at the bedside for inpatient rehabilitation assessment.  Patient will benefit from ongoing PT and OT, can actively participate in 3 hours of therapy a day 5 days of the week, and can make measurable gains during the admission.  Patient will also benefit from the coordinated team approach during an Inpatient Acute Rehabilitation admission.  The patient will receive intensive therapy as well as Rehabilitation physician, nursing, social worker, and care management interventions.  Due to bladder management, bowel management, safety, skin/wound care, disease management, medication administration, pain management, and patient education the patient requires 24 hour a day rehabilitation nursing.  The patient is currently min to mod assist overall with mobility and basic ADLs.  Discharge setting and  therapy post discharge at home with home health is anticipated.  Patient has agreed to participate in the Acute Inpatient Rehabilitation Program and will admit {Time; today/tomorrow:10263}.  Preadmission Screen Completed PC:[id number] Ophelia Sipe RN MSN with updates by Julious Payer, Audelia Acton, 04/23/2022 4:58 PM ______________________________________________________________________   Discussed status with Dr. Marland Kitchen on *** at *** and received approval for admission today.  Admission Coordinator: Danne Baxter RN MSN with updates by Julious Payer, Audelia Acton, RN, time Marland KitchenSudie Grumbling ***   Assessment/Plan: Diagnosis: Does the need for close, 24 hr/day Medical supervision in concert with the patient's rehab needs make it unreasonable for this patient  to be served in a less intensive setting? {yes_no_potentially:3041433} Co-Morbidities requiring supervision/potential complications: *** Due to {due EL:8590931}, does the patient require 24 hr/day rehab nursing? {yes_no_potentially:3041433} Does the patient require coordinated care of a physician, rehab nurse, PT, OT, and SLP to address physical and functional deficits in the context of the above medical diagnosis(es)? {yes_no_potentially:3041433} Addressing deficits in the following areas: {deficits:3041436} Can the patient actively participate in an intensive therapy program of at least 3 hrs of therapy 5 days a week? {yes_no_potentially:3041433} The potential for patient to make measurable gains while on inpatient rehab is {potential:3041437} Anticipated functional outcomes upon discharge from inpatient rehab: {functional outcomes:304600100} PT, {functional outcomes:304600100} OT, {functional outcomes:304600100} SLP Estimated rehab length of stay to reach the above functional goals is: *** Anticipated discharge destination: {anticipated dc setting:21604} 10. Overall Rehab/Functional Prognosis: {potential:3041437}   MD Signature: ***

## 2022-04-22 NOTE — TOC Initial Note (Signed)
Transition of Care [facility]) - Initial/Assessment Note    Patient Details  Name: Deborah Neal MRN: [id number] Date of Birth: 1950-06-23  [facility] of [facility]) CM/SW Contact:    Bethena Roys, RN Phone Number: 04/22/2022, 12:59 PM  Clinical Narrative: Patient post cardiac arrest-acute hypoxic respiratory failure. Inpatient Rehab Admissions Coordinator is following the patient for Inpatient Rehab admission. Case Manager will continue to follow for additional transition of care needs.               Expected Discharge Plan: IP Rehab Facility Barriers to Discharge: No Barriers Identified   Patient Goals and CMS Choice Patient states their goals for this hospitalization and ongoing recovery are:: to go to inpatient rehab facility.   Expected Discharge Plan and Services In-house Referral: NA Discharge Planning Services: CM Consult Post Acute Care Choice: IP Rehab Living arrangements for the past 2 months: Single Family Home                   DME Agency: NA   Prior Living Arrangements/Services Living arrangements for the past 2 months: [place] Lives with:: Adult Children ([place] daughter) Patient language and need for interpreter reviewed:: Yes Do you feel safe going back to the place where you live?: Yes      Need for Family Participation in Patient Care: Yes (Comment) Care giver support system in place?: Yes (comment)   Criminal Activity/Legal Involvement Pertinent to Current Situation/Hospitalization: No - Comment as needed  Permission Sought/Granted Permission sought to share information with : Case Manager, Customer service manager, Family Supports Permission granted to share information with : Yes, Verbal Permission Granted     Permission granted to share info w AGENCY: Inpatient Rehab   Emotional Assessment Appearance:: Appears stated age Attitude/Demeanor/Rapport: Unable to Assess Affect (typically observed): Unable to Assess Orientation:  : Oriented to Self Alcohol / Substance Use: Not Applicable Psych Involvement: No (comment)  Admission diagnosis:  Cardiac arrest ([place]) [I46.9] Seizure ([place]) [R56.9] Hyperglycemia [R73.9] Acute respiratory failure, unspecified whether with hypoxia or hypercapnia ([place]) [J96.00] Patient Active Problem List   Diagnosis Date Noted   Encephalopathy acute 04/13/2022   Cardiac arrest ([place]) 04/09/2022   Acute respiratory failure with hypoxia (HCC) 04/09/2022   Lactic acidosis 04/09/2022   Seizure ([place]) 04/09/2022   Type 2 diabetes mellitus without complication, with long-term current use of insulin ([place]) 04/09/2022   Shock ([place]) 04/09/2022   Encounter for well adult exam with abnormal findings 01/22/2022   Vitamin D deficiency 01/22/2022   HLD (hyperlipidemia) 07/08/2020   COVID-19 virus infection 08/31/2019   Renal insufficiency 08/30/2019   Low back pain 08/30/2019   Upper back pain on left side 08/30/2019   Upper respiratory infection 08/30/2019   Vaginitis 10/27/2018   Cough 06/29/2018   Asthma exacerbation 06/29/2018   Right leg weakness 06/15/2017   Toe pain, right 02/25/2017   Hyperglycemia 90/30/0923   Acute metabolic encephalopathy 30/10/6224   Thrombocytopenia ([place]) 02/21/2017   Pancytopenia ([place]) 02/21/2017   Preventative health care 09/28/2016   Dehydration    Hyperglycemic crisis in diabetes mellitus ([place]) 07/06/2016   AKI (acute kidney injury) ([place]) 07/06/2016   Hyperkalemia 07/06/2016   S/P laparoscopic cholecystectomy 09/19/2015   Abnormal chest CT 06/21/2014   Diabetes mellitus type II, non insulin dependent ([place]) 11/29/2013   Obesity, unspecified 09/19/2012   Plantar fasciitis, bilateral 06/08/2011   Backache 01/29/2009   Osteopenia 10/16/2007   Essential hypertension 01/29/2007   Asthma 01/29/2007   PCP:  Biagio Borg, MD Pharmacy:   [facility] [place], [place] [address] Phone: [phone] Fax: [phone]  [facility] ([place], [facility] [address] [place] [address] [place] [address] Phone: [phone] Fax: [phone]     Social Determinants of Health (SDOH) Social History: SDOH Screenings   Depression [phone]): Low Risk  (01/22/2022)  Tobacco Use: Medium Risk (01/22/2022)   Readmission Risk Interventions     No data to display

## 2022-04-22 NOTE — Inpatient Diabetes Management (Signed)
Inpatient Diabetes Program Recommendations  AACE/ADA: New Consensus Statement on Inpatient Glycemic Control (2015)  Target Ranges:  Prepandial:   less than 140 mg/dL      Peak postprandial:   less than 180 mg/dL (1-2 hours)      Critically ill patients:  140 - 180 mg/dL   Lab Results  Component Value Date   GLUCAP 312 (H) 04/22/2022   HGBA1C >15.5 (H) 04/12/2022    Latest Reference Range & Units 04/21/22 07:49 04/21/22 11:28 04/21/22 16:03 04/21/22 21:39 04/22/22 07:30  Glucose-Capillary 70 - 99 mg/dL 275 (H) 328 (H) 286 (H) 384 (H) 312 (H)   Diabetes history: DM 2 Outpatient Diabetes medications: Jardiance 25 mg daily, Toujeo 100 units daily, Humulin R U500 60 units TID with meals Current orders for Inpatient glycemic control:  Semglee 5 units bid Novolog 0-15 units tid Novolog 3 units tid meal coverage  Ensure Enlive tid between meals   Inpatient Diabetes Program Recommendations:     Pt received a total of 47 units of Novolog yesterday.  -  Increase Semglee to 10 units bid -  Increase Novolog meal coverage top 5 units tid   Thanks,  Tama Headings RN, MSN, BC-ADM Inpatient Diabetes Coordinator Team Pager [phone] (8a-5p)

## 2022-04-22 NOTE — Plan of Care (Signed)
  Problem: Education: Goal: Ability to manage disease process will improve Outcome: Progressing   Problem: Cardiac: Goal: Ability to achieve and maintain adequate cardiopulmonary perfusion will improve Outcome: Progressing   Problem: Neurologic: Goal: Promote progressive neurologic recovery Outcome: Progressing   Problem: Skin Integrity: Goal: Risk for impaired skin integrity will be minimized. Outcome: Progressing   Problem: Education: Goal: Ability to describe self-care measures that may prevent or decrease complications (Diabetes Survival Skills Education) will improve Outcome: Progressing Goal: Individualized Educational Video(s) Outcome: Progressing   Problem: Coping: Goal: Ability to adjust to condition or change in health will improve Outcome: Progressing   Problem: Fluid Volume: Goal: Ability to maintain a balanced intake and output will improve Outcome: Progressing   Problem: Health Behavior/Discharge Planning: Goal: Ability to identify and utilize available resources and services will improve Outcome: Progressing Goal: Ability to manage health-related needs will improve Outcome: Progressing   Problem: Metabolic: Goal: Ability to maintain appropriate glucose levels will improve Outcome: Progressing   Problem: Nutritional: Goal: Maintenance of adequate nutrition will improve Outcome: Progressing Goal: Progress toward achieving an optimal weight will improve Outcome: Progressing   Problem: Skin Integrity: Goal: Risk for impaired skin integrity will decrease Outcome: Progressing   Problem: Tissue Perfusion: Goal: Adequacy of tissue perfusion will improve Outcome: Progressing   Problem: Education: Goal: Ability to describe self-care measures that may prevent or decrease complications (Diabetes Survival Skills Education) will improve Outcome: Progressing Goal: Individualized Educational Video(s) Outcome: Progressing   Problem: Cardiac: Goal: Ability to  maintain an adequate cardiac output will improve Outcome: Progressing   Problem: Health Behavior/Discharge Planning: Goal: Ability to identify and utilize available resources and services will improve Outcome: Progressing Goal: Ability to manage health-related needs will improve Outcome: Progressing   Problem: Fluid Volume: Goal: Ability to achieve a balanced intake and output will improve Outcome: Progressing   Problem: Metabolic: Goal: Ability to maintain appropriate glucose levels will improve Outcome: Progressing   Problem: Nutritional: Goal: Maintenance of adequate nutrition will improve Outcome: Progressing Goal: Maintenance of adequate weight for body size and type will improve Outcome: Progressing   Problem: Respiratory: Goal: Will regain and/or maintain adequate ventilation Outcome: Progressing   Problem: Urinary Elimination: Goal: Ability to achieve and maintain adequate renal perfusion and functioning will improve Outcome: Progressing   Problem: Education: Goal: Knowledge of General Education information will improve Description: Including pain rating scale, medication(s)/side effects and non-pharmacologic comfort measures Outcome: Progressing   Problem: Health Behavior/Discharge Planning: Goal: Ability to manage health-related needs will improve Outcome: Progressing   Problem: Clinical Measurements: Goal: Ability to maintain clinical measurements within normal limits will improve Outcome: Progressing Goal: Will remain free from infection Outcome: Progressing Goal: Diagnostic test results will improve Outcome: Progressing Goal: Respiratory complications will improve Outcome: Progressing Goal: Cardiovascular complication will be avoided Outcome: Progressing   Problem: Activity: Goal: Risk for activity intolerance will decrease Outcome: Progressing   Problem: Nutrition: Goal: Adequate nutrition will be maintained Outcome: Progressing   Problem:  Coping: Goal: Level of anxiety will decrease Outcome: Progressing   Problem: Elimination: Goal: Will not experience complications related to bowel motility Outcome: Progressing Goal: Will not experience complications related to urinary retention Outcome: Progressing   Problem: Pain Managment: Goal: General experience of comfort will improve Outcome: Progressing   Problem: Safety: Goal: Ability to remain free from injury will improve Outcome: Progressing   Problem: Skin Integrity: Goal: Risk for impaired skin integrity will decrease Outcome: Progressing

## 2022-04-22 NOTE — Progress Notes (Signed)
ANTICOAGULATION CONSULT NOTE  Pharmacy Consult for heparin  Indication: pulmonary embolus and clot right ventricle  Allergies  Allergen Reactions   Shingrix [Zoster Vac Recomb Adjuvanted]     Allergy to original live zoster - rash    Patient Measurements: Height: '5\' 3"'$  (160 cm) Weight: 77.6 kg (171 lb 1.2 oz) IBW/kg (Calculated) : 52.4 Heparin Dosing Weight: 68kg  Vital Signs: Temp: 98.2 F (36.8 C) (12/28 0729) Temp Source: Oral (12/28 0729) BP: 157/75 (12/28 0729) Pulse Rate: 88 (12/28 0729)  Labs: Recent Labs    04/20/22 0245 04/20/22 1222 04/20/22 1946 04/21/22 0529 04/21/22 1221 04/21/22 1222 04/21/22 2238 04/22/22 0805  HGB 7.6* 8.0*  --  7.3* 8.5*  --   --  7.9*  HCT 24.1* 25.7*  --  24.5* 28.0*  --   --  24.9*  PLT 264 279  --  278  --   --   --  297  HEPARINUNFRC 0.26*  --    < > 0.22*  --  0.13* 0.83* 0.83*  CREATININE 1.68*  --   --  1.43*  --   --   --  1.20*   < > = values in this interval not displayed.     Estimated Creatinine Clearance: 43 mL/min (A) (by C-G formula based on SCr of 1.2 mg/dL (H)).  Assessment: Pt was admitted for being unresponsive with CPR administered. Pt went into cardiac arrest and found to have thrombus in right ventricle on ECHO. TNK was administered. Heparin gtt ordered for anticoagulation post TNK.   Heparin level now remains elevated, suspect low levels yesterday were due to infusion issues (line replace).  Goal of Therapy:  Heparin level 0.3-0.5 units/ml Monitor platelets by anticoagulation protocol: Yes   Plan:  Reduce heparin to 1300 units/h Recheck heparin level in 8h    Arrie Senate, PharmD, [place], [facility] Clinical Pharmacist [phone] Please check AMION for all [place] numbers 04/22/2022

## 2022-04-23 ENCOUNTER — Inpatient Hospital Stay (HOSPITAL_COMMUNITY): Payer: Medicare PPO

## 2022-04-23 DIAGNOSIS — I3139 Other pericardial effusion (noninflammatory): Secondary | ICD-10-CM

## 2022-04-23 DIAGNOSIS — R7989 Other specified abnormal findings of blood chemistry: Secondary | ICD-10-CM

## 2022-04-23 DIAGNOSIS — I469 Cardiac arrest, cause unspecified: Secondary | ICD-10-CM | POA: Diagnosis not present

## 2022-04-23 LAB — IRON AND TIBC
Iron: 46 ug/dL (ref 28–170)
Saturation Ratios: 19 % (ref 10.4–31.8)
TIBC: 249 ug/dL — ABNORMAL LOW (ref 250–450)
UIBC: 203 ug/dL

## 2022-04-23 LAB — ECHOCARDIOGRAM LIMITED
Area-P 1/2: 4.8 cm2
Height: 63 in
Weight: 2740.76 oz

## 2022-04-23 LAB — GLUCOSE, CAPILLARY
Glucose-Capillary: 329 mg/dL — ABNORMAL HIGH (ref 70–99)
Glucose-Capillary: 239 mg/dL — ABNORMAL HIGH (ref 70–99)
Glucose-Capillary: 158 mg/dL — ABNORMAL HIGH (ref 70–99)
Glucose-Capillary: 168 mg/dL — ABNORMAL HIGH (ref 70–99)

## 2022-04-23 LAB — COMPREHENSIVE METABOLIC PANEL
ALT: 50 U/L — ABNORMAL HIGH (ref 0–44)
AST: 29 U/L (ref 15–41)
Albumin: 2.3 g/dL — ABNORMAL LOW (ref 3.5–5.0)
Alkaline Phosphatase: 119 U/L (ref 38–126)
Anion gap: 10 (ref 5–15)
BUN: 19 mg/dL (ref 8–23)
CO2: 28 mmol/L (ref 22–32)
Calcium: 8.6 mg/dL — ABNORMAL LOW (ref 8.9–10.3)
Chloride: 102 mmol/L (ref 98–111)
Creatinine, Ser: 1.08 mg/dL — ABNORMAL HIGH (ref 0.44–1.00)
GFR, Estimated: 55 mL/min — ABNORMAL LOW (ref >60)
Glucose, Bld: 333 mg/dL — ABNORMAL HIGH (ref 70–99)
Potassium: 3.8 mmol/L (ref 3.5–5.1)
Sodium: 140 mmol/L (ref 135–145)
Total Bilirubin: 0.4 mg/dL (ref 0.3–1.2)
Total Protein: 5.9 g/dL — ABNORMAL LOW (ref 6.5–8.1)

## 2022-04-23 LAB — CBC WITH DIFFERENTIAL/PLATELET
Abs Immature Granulocytes: 0.16 10*3/uL — ABNORMAL HIGH (ref 0.00–0.07)
Basophils Absolute: 0 10*3/uL (ref 0.0–0.1)
Basophils Relative: 1 %
Eosinophils Absolute: 0.1 10*3/uL (ref 0.0–0.5)
Eosinophils Relative: 2 %
HCT: 24.6 % — ABNORMAL LOW (ref 36.0–46.0)
Hemoglobin: 7.6 g/dL — ABNORMAL LOW (ref 12.0–15.0)
Immature Granulocytes: 2 %
Lymphocytes Relative: 18 %
Lymphs Abs: 1.3 10*3/uL (ref 0.7–4.0)
MCH: 23.5 pg — ABNORMAL LOW (ref 26.0–34.0)
MCHC: 30.9 g/dL (ref 30.0–36.0)
MCV: 76.2 fL — ABNORMAL LOW (ref 80.0–100.0)
Monocytes Absolute: 0.5 10*3/uL (ref 0.1–1.0)
Monocytes Relative: 6 %
Neutro Abs: 5.3 10*3/uL (ref 1.7–7.7)
Neutrophils Relative %: 71 %
Platelets: 217 10*3/uL (ref 150–400)
RBC: 3.23 MIL/uL — ABNORMAL LOW (ref 3.87–5.11)
RDW: 15.4 % (ref 11.5–15.5)
WBC: 7.4 10*3/uL (ref 4.0–10.5)
nRBC: 0.3 % — ABNORMAL HIGH (ref 0.0–0.2)

## 2022-04-23 LAB — FERRITIN: Ferritin: 453 ng/mL — ABNORMAL HIGH (ref 11–307)

## 2022-04-23 LAB — PHOSPHORUS: Phosphorus: 2.2 mg/dL — ABNORMAL LOW (ref 2.5–4.6)

## 2022-04-23 LAB — MAGNESIUM: Magnesium: 1.7 mg/dL (ref 1.7–2.4)

## 2022-04-23 LAB — HEPARIN LEVEL (UNFRACTIONATED): Heparin Unfractionated: 0.38 IU/mL (ref 0.30–0.70)

## 2022-04-23 LAB — FOLATE: Folate: 16.3 ng/mL (ref >5.9)

## 2022-04-23 MED ORDER — POTASSIUM CHLORIDE CRYS ER 20 MEQ PO TBCR
40.0000 meq | EXTENDED_RELEASE_TABLET | Freq: Once | ORAL | Status: AC
  Administered 2022-04-23: 40 meq via ORAL
  Filled 2022-04-23: qty 2

## 2022-04-23 MED ORDER — INSULIN ASPART 100 UNIT/ML IJ SOLN
8.0000 [IU] | Freq: Three times a day (TID) | INTRAMUSCULAR | Status: DC
  Administered 2022-04-23 – 2022-04-25 (×5): 8 [IU] via SUBCUTANEOUS

## 2022-04-23 MED ORDER — INSULIN GLARGINE-YFGN 100 UNIT/ML ~~LOC~~ SOLN
18.0000 [IU] | Freq: Two times a day (BID) | SUBCUTANEOUS | Status: DC
  Administered 2022-04-23 – 2022-04-25 (×4): 18 [IU] via SUBCUTANEOUS
  Filled 2022-04-23 (×5): qty 0.18

## 2022-04-23 MED ORDER — MAGNESIUM OXIDE -MG SUPPLEMENT 400 (240 MG) MG PO TABS
400.0000 mg | ORAL_TABLET | Freq: Every day | ORAL | Status: DC
  Administered 2022-04-23 – 2022-04-25 (×3): 400 mg via ORAL
  Filled 2022-04-23 (×3): qty 1

## 2022-04-23 MED ORDER — APIXABAN 5 MG PO TABS
5.0000 mg | ORAL_TABLET | Freq: Two times a day (BID) | ORAL | Status: DC
  Administered 2022-04-23 – 2022-04-25 (×5): 5 mg via ORAL
  Filled 2022-04-23 (×5): qty 1

## 2022-04-23 MED ORDER — K PHOS MONO-SOD PHOS DI & MONO 155-852-130 MG PO TABS
500.0000 mg | ORAL_TABLET | Freq: Three times a day (TID) | ORAL | Status: AC
  Administered 2022-04-23 (×3): 500 mg via ORAL
  Filled 2022-04-23 (×4): qty 2

## 2022-04-23 NOTE — Inpatient Diabetes Management (Addendum)
Inpatient Diabetes Program Recommendations  AACE/ADA: New Consensus Statement on Inpatient Glycemic Control (2015)  Target Ranges:  Prepandial:   less than 140 mg/dL      Peak postprandial:   less than 180 mg/dL (1-2 hours)      Critically ill patients:  140 - 180 mg/dL   Lab Results  Component Value Date   GLUCAP 329 (H) 04/23/2022   HGBA1C >15.5 (H) 04/12/2022    Review of Glycemic Control  Latest Reference Range & Units 04/22/22 16:59 04/22/22 21:41 04/23/22 07:59  Glucose-Capillary 70 - 99 mg/dL 185 (H) 141 (H) 329 (H)  (H): Data is abnormally high Diabetes history: DM 2 Outpatient Diabetes medications: Jardiance 25 mg daily, Toujeo 100 units daily, Humulin R U500 60 units TID with meals Current orders for Inpatient glycemic control:  Semglee 10 units bid Novolog 0-15 units tid Novolog 5 units tid meal coverage   Ensure Enlive tid between meals   Inpatient Diabetes Program Recommendations:    Consider: -Increasing Semglee 18 units BID.  - Increasing Novolog 8 units TID (assuming patient to consume >50% of meals).    Addendum: Spoke with patient and patient's daughter, Joycelyn Schmid regarding outpatient diabetes management. Patient denies missed doses and gave permission to speak with daughter. Patient sees outpatient endo however, cannot remember name of provider. Reviewed patient's current A1c of >15%. Explained what a A1c is and what it measures. Also reviewed goal A1c with patient, importance of good glucose control @ home, and blood sugar goals. Reviewed patho of DM, need for improved control, impact of diabetes from cardiac perspective, signs and symptoms of hyper vs hypo glycemia, interventions, vascular changes and commorbidities.  Patient need a new meter and strips at discharge. Meter kit [id number]. Reviewed frequency and when to call MD. Patient and daughter encouraged to see outpatient endocrinology. Daughter intends to make appointment.  Patient's daughter reports that  mother takes her medication, however, went into home and found hidden chocolate, snacks, sodas etc in her room. They have since been removed and daughter plans to help assist with meals. Dietitian consult placed. Encouraged alternatives beverages, plate method and importance of CHO mindfulness.  No further questions at this time.    Thanks, Bronson Curb, MSN, RNC-OB Diabetes Coordinator [phone] (8a-5p)

## 2022-04-23 NOTE — Progress Notes (Signed)
Inpatient Rehabilitation Admissions Coordinator   I have insurance approval for [facility] admit. Echo results pending with possible admit Saturday or Sunday. Discussed with Dr Florene Glen, patient and daughter, Joycelyn Schmid. We will follow up this weekend with possible admit to [facility] pending echo results.   Danne Baxter, RN, MSN Rehab Admissions Coordinator [phone] 04/23/2022 3:22 PM

## 2022-04-23 NOTE — Progress Notes (Addendum)
ANTICOAGULATION CONSULT NOTE  Pharmacy Consult for heparin  Indication: pulmonary embolus and clot right ventricle  Allergies  Allergen Reactions   Shingrix [Zoster Vac Recomb Adjuvanted]     Allergy to original live zoster - rash    Patient Measurements: Height: '5\' 3"'$  (160 cm) Weight: 77.7 kg (171 lb 4.8 oz) IBW/kg (Calculated) : 52.4 Heparin Dosing Weight: 68kg  Vital Signs: Temp: 98 F (36.7 C) (12/29 0400) Temp Source: Oral (12/29 0400) BP: 142/68 (12/29 0400) Pulse Rate: 80 (12/29 0400)  Labs: Recent Labs    04/21/22 0529 04/21/22 1221 04/21/22 1222 04/22/22 0805 04/22/22 1922 04/23/22 0436  HGB 7.3* 8.5*  --  7.9*  --  7.6*  HCT 24.5* 28.0*  --  24.9*  --  24.6*  PLT 278  --   --  297  --  217  HEPARINUNFRC 0.22*  --    < > 0.83* 0.65 0.38  CREATININE 1.43*  --   --  1.20*  --  1.08*   < > = values in this interval not displayed.     Estimated Creatinine Clearance: 47.8 mL/min (A) (by C-G formula based on SCr of 1.08 mg/dL (H)).  Assessment: Pt was admitted for being unresponsive with CPR administered. Pt went into cardiac arrest and found to have thrombus in right ventricle on ECHO. TNK was administered. Heparin gtt ordered for anticoagulation post TNK. CT chest 12/28 with small acute PE. Heparin level goal was lowered to 0.3-0.5 with ongoing low Hgb.  Heparin level therapeutic at 0.38, CBC stable.  Goal of Therapy:  Heparin level 0.3-0.5 units/ml Monitor platelets by anticoagulation protocol: Yes   Plan:  Continue heparin 1150 units/h Daily heparin level and CBC   ADDENDUM: Pt to transition to apixaban. Given long duration of parenteral anticoagulation,TNKase being administered, concerns for low Hgb - will defer loading dose and give '5mg'$  BID alone.   Arrie Senate, PharmD, BCPS, [facility] Clinical Pharmacist [phone] Please check AMION for all [place] numbers 04/23/2022

## 2022-04-23 NOTE — Progress Notes (Addendum)
PROGRESS NOTE    Deborah [facility]  EHU:[id number] DOB: 06/19/1950 DOA: 04/09/2022 PCP: Deborah Borg, MD  Chief Complaint  Patient presents with   CPR    Brief Narrative:  71 year old female with past medical history as below, which is significant for hypertension, hyperlipidemia, type 2 diabetes mellitus, and asthma. She was in her usual state of health until approximately 5:30 AM on 12/15 when she was seen by granddaughter. Family attempted to contact her via phone around 7 AM and she did not answer. When they later went to check on her around 10 AM she was unresponsive in her bed with red emesis seen around her. Upon arrival to the fire department the patient was unresponsive but did have pulse and respirations. Then suffered seizure. Upon EMS arrival patient lost pulses and CPR was initiated. CPR less than 5 minutes. Patient demonstrated seizure-like activity en route to [facility] emergency department. Improved with benzodiazepine administration. Upon arrival to emergency department the patient was intubated. She remained unresponsive at the time [facility] was asked to admit.   Significant Events 12/15 admit to [facility] s/p cardiac arrest 12/17 MRI - neg acute, stable moderate atrophy and white matter disease. This likely reflects the sequela of chronic microvascular ischemia. 12/20-weaning well, no cuff leak. Steroids started.  12/20 not tolerating SBT, rapid rate.  12/22 Heparin stopped 2/2 hematuria 12/23>> Extubated 12/24 >> Heparin restarted , at [facility] lower therapeutic range        12/25 HGB from from 8.8 to 7.3 after heparin resumed 12/25   Assessment & Plan:   Principal Problem:   Cardiac arrest [facility]) Active Problems:   Acute respiratory failure with hypoxia (HCC)   Lactic acidosis   Seizure ([facility])   Type 2 diabetes mellitus without complication, with long-term current use of insulin ([facility])   Shock ([facility])   Encephalopathy acute  Out of Neal PEA Cardiac Arrest In [facility]  Vfib/vtach arrest in setting of severe hypokalemia Presumed Massive Pulmonary Embolism s/p TNKase Completed normothermia protocol s/p TNKase Transition to eliquis today LE [place] negative for DVT Echo with findings concerning for clot in transit, RV function mildly reduced with free wall hypokinesis, EF 65-70% no RWMA, grade 1 diastolic dysfunction CT PE protocol with PE with very small thrombus burden (this was on 12/28)  Acute Hypoxic Respiratory Failure  Multifocal Pneumonia  Aspiration Pneumonia  Bilateral Pleural Effusions Serratia Marcescens Pneumonia Continue cefepime 12/23 - 29. Has lasix ordered, will continue as tolerated Planning for repeat CT scan (with patchy infiltrates in both lungs, decrease in amount of bilateral effusions), depending on response to lasix/may need thoracentesis for effusions will continue diuresis for now - will repeat CXR 12/30.  Pericardial Effusion Follow limited echo  Acute Kidney Injury Creatinine peaked at 5.64 Baseline appears to be <1 (06/2021) Improving, follow with lasix   Diffuse Body Wall Edema Noted on CT 12/19  Continue lasix as tolerated Follow albumin 2.6, UA without protein  Seizure Like Activity EEG with findings suggestive of severe to profound diffuse encephalopathy, no seizures or epileptiform discharges Keppra has been d/c'd MRI brain without acute intracranial abnormality or significant interval change  Demand Ischemia  In the setting of presumed massive PE, s/p CPR after PEA arrest  Will need cardiology follow up as an outpatient   Anemia Fluctuating, relatively stable Follow iron, b12, folate, ferritin (suggestive of chronic dz - b12 pending) CT scan 12/19 negative for retroperitoneal hematoma  Dense Atelectasis in LUL with decreased volume in L Lung  Concern for chronic obstruction of LUL bronchus - needs short term follow up CT in 3 months vs endoscopy or PET CT  Hypertension Lasix Home meds on hold  (losartan) Amlodipine was started 12/26  Hypernatremia Improved  Hypokalemia Replace and follow  DKA Continue basal bolus regimen (on [facility] very high dose regimen at home, she's receiving [facility] Neal fraction of this currently) Adjust as needed Holding jardiance for now with recent AKI  HLD Holding crestor for now    DVT prophylaxis: heparin gtt -> eliquis Code Status: full Family Communication: daughter at bedside Disposition:   Status is: Inpatient Remains inpatient appropriate because: ongoing workup, need for abx, anticoagulation   Consultants:  [facility] renal  Procedures:  LE [place] Summary:  Deborah Neal:  - There is no evidence of deep vein thrombosis in the lower extremity.    - [facility] Neal cystic structure is found in the popliteal fossa measuring 5.0 x 1.1 x  2.9 cm.    Deb[facility] Neal:  - There is no evidence of deep vein thrombosis in the lower extremity.    - No cystic structure found in the popliteal fossa.     *See table(s) above for measurements and observations.   Echo IMPRESSIONS     1. There appears to be [facility] Neal mobile echodensity in the RV outflow tract  (images 45-51), not well visualized but given presentation with cardiac  arrest, concerning for thrombus and recommend PE evaluation. RV function  appears mildly reduced with free wall  hypokinesis   2. [facility] ventricular ejection fraction, by estimation, is 65 to 70%. The  Deborah Neal ventricle has normal function. The Deborah Neal ventricle has no regional  wall motion abnormalities. There is mild [facility] Neal ventricular hypertrophy.  Deborah Neal ventricular diastolic parameters  are consistent with Grade I diastolic dysfunction (impaired relaxation).   3. Deborah Neal ventricular systolic function is mildly reduced. The Deborah Neal  ventricular size is normal. There is mildly elevated pulmonary artery  systolic pressure.   4. The mitral valve is normal in structure. Trivial mitral valve  regurgitation.   5. The aortic valve is tricuspid. Aortic valve regurgitation is not   visualized. No aortic stenosis is present.   6. The inferior vena cava is normal in size with greater than 50%  respiratory variability, suggesting [facility] atrial pressure of 3 mmHg.   Antimicrobials:  Anti-infectives (From admission, onward)    Start     Dose/Rate Route Frequency Ordered Stop   04/20/22 2200  ceFEPIme (MAXIPIME) 2 g in sodium chloride 0.9 % 100 mL IVPB        2 g 200 mL/hr over 30 Minutes Intravenous Every 12 hours 04/20/22 1107 04/23/22 2359   04/17/22 1400  ceFEPIme (MAXIPIME) 2 g in sodium chloride 0.9 % 100 mL IVPB  Status:  Discontinued        2 g 200 mL/hr over 30 Minutes Intravenous Every 24 hours 04/17/22 1311 04/20/22 1107   04/10/22 1100  Ampicillin-Sulbactam (UNASYN) 3 g in sodium chloride 0.9 % 100 mL IVPB        3 g 200 mL/hr over 30 Minutes Intravenous Every 12 hours 04/10/22 0953 04/16/22 2137   04/09/22 1430  cefTRIAXone (ROCEPHIN) 2 g in sodium chloride 0.9 % 100 mL IVPB  Status:  Discontinued        2 g 200 mL/hr over 30 Minutes Intravenous Every 24 hours 04/09/22 1333 04/10/22 0945       Subjective: No new complaints  Objective: Vitals:   04/23/22 0630 04/23/22 0901 04/23/22 0933 04/23/22  1300  BP:  120/85  133/69  Pulse:  74  93  Resp:  18  18  Temp:  97.9 F (36.6 C)  98.3 F (36.8 C)  TempSrc:  Oral  Oral  SpO2:  100% 100% 100%  Weight: 77.7 kg     Height:        Intake/Output Summary (Last 24 hours) at 04/23/2022 1700 Last data filed at 04/23/2022 1100 Gross per 24 hour  Intake 472 ml  Output 2750 ml  Net -2278 ml   Filed Weights   04/21/22 0422 04/22/22 0426 04/23/22 0630  Weight: 80.9 kg 77.6 kg 77.7 kg    Examination:  General: No acute distress. Cardiovascular: RRR Lungs: diminished Abdomen: Soft, nontender, nondistended Neurological: Alert and oriented 3. Moves all extremities 4 with equal strength. Cranial nerves II through XII grossly intact. Extremities: No clubbing or cyanosis. No edema.  Data Reviewed:  I have personally reviewed following labs and imaging studies  CBC: Recent Labs  Lab 04/19/22 0510 04/19/22 1424 04/20/22 0245 04/20/22 1222 04/21/22 0529 04/21/22 1221 04/22/22 0805 04/23/22 0436  WBC 9.4 14.1* 10.7* 10.8* 8.5  --  9.3 7.4  NEUTROABS 7.4 11.6* 8.4*  --   --   --   --  5.3  HGB 7.3* 9.3* 7.6* 8.0* 7.3* 8.5* 7.9* 7.6*  HCT 23.3* 29.0* 24.1* 25.7* 24.5* 28.0* 24.9* 24.6*  MCV 74.7* 74.0* 74.4* 73.9* 75.9*  --  74.8* 76.2*  PLT 220 286 264 279 278  --  297 161    Basic Metabolic Panel: Recent Labs  Lab 04/17/22 0339 04/18/22 0233 04/18/22 1257 04/19/22 0510 04/20/22 0245 04/21/22 0529 04/22/22 0805 04/23/22 0436  NA 154* 148*   < > 146* 144 141 142 140  K 3.0* 3.1*   < > 3.4* 3.3* 3.1* 3.5 3.8  CL 119* 113*   < > 112* 111 103 104 102  CO2 26 23   < > '22 25 29 29 28  '$ GLUCOSE 169* 129*   < > 129* 148* 297* 352* 333*  BUN 68* 56*   < > 43* 32* '23 23 19  '$ CREATININE 3.43* 2.47*   < > 2.06* 1.68* 1.43* 1.20* 1.08*  CALCIUM 8.6* 8.9   < > 8.2* 8.5* 8.5* 8.5* 8.6*  MG 1.9 1.5*  --  1.8 1.9 1.8 1.8 1.7  PHOS 4.5 4.7*  --   --   --   --   --  2.2*   < > = values in this interval not displayed.    GFR: Estimated Creatinine Clearance: 47.8 mL/min (Jairy Angulo) (by C-G formula based on SCr of 1.08 mg/dL (H)).  Liver Function Tests: Recent Labs  Lab 04/21/22 1222 04/23/22 0436  AST  --  29  ALT  --  50*  ALKPHOS  --  119  BILITOT  --  0.4  PROT  --  5.9*  ALBUMIN 2.6* 2.3*    CBG: Recent Labs  Lab 04/22/22 1659 04/22/22 2141 04/23/22 0759 04/23/22 1159 04/23/22 1626  GLUCAP 185* 141* 329* 239* 158*     Recent Results (from the past 240 hour(s))  Culture, Respiratory w Gram Stain     Status: None   Collection Time: 04/16/22  9:10 AM   Specimen: Tracheal Aspirate; Respiratory  Result Value Ref Range Status   Specimen Description TRACHEAL ASPIRATE  Final   Special Requests NONE  Final   Gram Stain FEW WBC PRESENT, PREDOMINANTLY PMN RARE YEAST    Final  Culture   Final    FEW SERRATIA MARCESCENS FEW CANDIDA ALBICANS No Pseudomonas species isolated NO STAPHYLOCOCCUS AUREUS ISOLATED Performed at [facility], [place] [address] [place], [place] [address]    Report Status 04/18/2022 FINAL  Final   Organism ID, Bacteria SERRATIA MARCESCENS  Final      Susceptibility   Serratia marcescens - MIC*    CEFAZOLIN >=64 RESISTANT Resistant     CEFEPIME <=0.12 SENSITIVE Sensitive     CEFTAZIDIME <=1 SENSITIVE Sensitive     CEFTRIAXONE <=0.25 SENSITIVE Sensitive     CIPROFLOXACIN <=0.25 SENSITIVE Sensitive     GENTAMICIN <=1 SENSITIVE Sensitive     TRIMETH/SULFA <=20 SENSITIVE Sensitive     * FEW SERRATIA MARCESCENS         Radiology Studies: CT Angio Chest Pulmonary Embolism (PE) W or WO Contrast  Addendum Date: 04/22/2022   ADDENDUM REPORT: 04/22/2022 15:42 ADDENDUM: Imaging finding of small thrombus burden PE in [facility] lower lobe and [facility] heart dysfunction were relayed to Dr. Florene Glen by telephone call. Electronically Signed   By: Elmer Picker M.D.   On: 04/22/2022 15:42   Result Date: 04/22/2022 CLINICAL DATA:  Cardiac arrest, abnormal echocardiogram EXAM: CT ANGIOGRAPHY CHEST WITH CONTRAST TECHNIQUE: Multidetector CT imaging of the chest was performed using the standard protocol during bolus administration of intravenous contrast. Multiplanar CT image reconstructions and MIPs were obtained to evaluate the vascular anatomy. RADIATION DOSE REDUCTION: This exam was performed according to the departmental dose-optimization program which includes automated exposure control, adjustment of the mA and/or kV according to patient size and/or use of iterative reconstruction technique. CONTRAST:  76m OMNIPAQUE IOHEXOL 350 MG/ML SOLN COMPARISON:  Noncontrast CT chest done on 04/16/2022 and chest radiographs done on 04/19/2022 FINDINGS: Cardiovascular: There are no intraluminal filling defects in central pulmonary artery branches. In  image 535of series 5, filling defect is seen in is subsegmental pulmonary artery branch in the posterior [facility] lower lung field. Motion artifacts limit evaluation of small peripheral pulmonary artery branches. Coronary artery calcifications are seen. There is homogeneous enhancement in thoracic aorta. There are scattered calcifications in thoracic aorta and its major branches. Small pericardial effusion is present. RV LV ratio is 1.3. Mediastinum/Nodes: There are enlarged lymph nodes in both hilar regions and mediastinum with no interval change. Lungs/Pleura: There is interval removal of endotracheal tube and enteric tube. There is interval improvement in aeration in both mid and lower lung fields. There is interval worsening of infiltrates in [facility] upper lobe. There is linear focus of dense infiltrate in the medial Deborah [facility] upper lobe, possibly chronic atelectasis. Residual patchy infiltrates are noted in Deborah [facility] lower lobe. Small to moderate bilateral pleural effusions are seen, more so on the Deborah Neal side. There is interval decrease in amount of bilateral pleural effusions. There is no pneumothorax. Upper Abdomen: Surgical clips are seen in gallbladder fossa. There is 5 mm calcific density in the upper pole of Deborah Neal kidney. Musculoskeletal: No acute findings are seen. Review of the MIP images confirms the above findings. IMPRESSION: There is intraluminal filling defect in small subsegmental branch in Deborah Neal lower lobe suggesting pulmonary embolism with very small thrombus burden. No other definite intraluminal filling defects are seen. RV LV ratio is 1.3 which may suggest chronic [facility] Neal heart dysfunction. Small pericardial effusion is present. Patchy infiltrates are seen in both lungs with areas of improvement and areas of worsening since 04/16/2022. Findings suggest multifocal pneumonia. There is dense atelectasis in the medial [facility] Neal upper lobe which  has not changed significantly. There is interval decrease in amount of  bilateral pleural effusions. Coronary artery disease.  [facility] Neal renal calculus. Other findings as described in the body of the report. Electronically Signed: By: Elmer Picker M.D. On: 04/22/2022 15:32        Scheduled Meds:  amLODipine  7.5 mg Oral Daily   apixaban  5 mg Oral BID   Chlorhexidine Gluconate Cloth  6 each Topical Daily   feeding supplement  237 mL Oral TID WC   furosemide  40 mg Intravenous Daily   insulin aspart  0-15 Units Subcutaneous TID WC   insulin aspart  8 Units Subcutaneous TID WC   insulin glargine-yfgn  18 Units Subcutaneous BID   magnesium oxide  400 mg Oral Daily   multivitamin with minerals  1 tablet Oral Daily   pantoprazole (PROTONIX) IV  40 mg Intravenous QHS   phosphorus  500 mg Oral TID AC & HS   Continuous Infusions:  sodium chloride Stopped (04/17/22 1020)   ceFEPime (MAXIPIME) IV 2 g (04/23/22 0855)     LOS: 14 days    Time spent: over 106 min    Fayrene Helper, MD [facility]   To contact the attending provider between 7A-7P or the covering provider during after hours 7P-7A, please log into the web site [email or web address] and access using universal [place] password for that web site. If you do not have the password, please call the [facility] operator.  04/23/2022, 5:00 PM

## 2022-04-23 NOTE — H&P (Signed)
Physical Medicine and Rehabilitation Admission H&P    CC: Debility secondary to cardiac arrest and pneumonia  HPI: Deborah Neal is a 71 year old female whose family attempted to contact her on the phone morning of 04/09/2022.  When she did not answer they found her at home unresponsive.  This was called and upon arrival she lost her pulse and CPR was initiated.  Demonstrated seizure-like activity on route to emergency department which improved with benzodiazepine administration.  She was intubated and admitted to pulmonary critical care medicine.  Laboratory workup significant for lactic acidosis, acute kidney injury, elevated troponins and a hemoglobin A1c of greater than 15.5%.  Unable to do CTA of the chest due to kidney function.  She was initially started on heparin infusion due to concern for pulmonary embolism.  She subsequently became hypotensive and required Levophed then went into cardiac arrest with ventricular tachycardia/ventricular fibrillation.  ROSC was achieved after 2 minutes.  She was treated with tenecteplase.  Extremity ultrasound negative for DVT.  She was treated for acute hypoxic respiratory failure with multifocal pneumonia secondary to Serratia marcescens.  After renal recovery, she underwent CT angiogram which showed an intraluminal filling defect and a small subsegmental branch in the right lower lobe suggesting pulmonary embolism.  She was extubated on 12/23.  She is currently receiving cefepime IV which is to be completed 12/29.  Lasix continues for diffuse body wall edema.  Her EEG was suggestive of severe to profound diffuse encephalopathy and MRI of the brain was without acute intracranial abnormality.  On Keppra, discontinued on 12/29. Diabetic coordinator consulted for insulin management.  Per medication administration record, patient started on Eliquis and heparin infusion discontinued 12/29. Cardiology consulted on 12/29 and echocardiogram performed. EF estimated at 60-65%.   The patient requires inpatient physical medicine and rehabilitation evaluations and treatment secondary to dysfunction due to ability secondary to cardiac arrest, pneumonia.  Pt reports some sore hands from BG checks; hate getting BG checked A little dizzy/wobbly when stands up LBM this AM Peeing a lot Feels like cognition is working well/brain "working well"- daughter Andrey Campanile agrees.   Peeing a lot, so using purewick No N/V/abd pain.    Review of Systems  Constitutional:  Negative for chills, fever, malaise/fatigue and weight loss.  HENT: Negative.    Eyes: Negative.   Respiratory:  Negative for sputum production, shortness of breath and wheezing.   Cardiovascular:  Negative for chest pain, palpitations, orthopnea and claudication.  Gastrointestinal:  Negative for abdominal pain, constipation, heartburn, nausea and vomiting.  Genitourinary:  Negative for dysuria, hematuria and urgency.  Musculoskeletal:  Negative for joint pain and myalgias.       Rib/chest wall pain  Skin:  Positive for rash. Negative for itching.  Neurological:  Positive for dizziness and weakness. Negative for tremors, sensory change and headaches.  Endo/Heme/Allergies: Negative.   Psychiatric/Behavioral:  Negative for depression, memory loss and suicidal ideas. The patient is not nervous/anxious and does not have insomnia.    Past Medical History:  Diagnosis Date   ALLERGIC RHINITIS 01/29/2007   Arthritis    ASTHMA 01/29/2007   ASTHMA, WITH ACUTE EXACERBATION 06/24/2008   BACK PAIN 01/29/2009   CHEST PAIN 06/24/2008   Family history of adverse reaction to anesthesia    Patients daughter has N/V after anesthesia   GERD (gastroesophageal reflux disease)    History of shingles    HYPERLIPIDEMIA 01/29/2007   HYPERTENSION 01/29/2007   OSTEOPENIA 10/16/2007   Type II or unspecified type diabetes mellitus  without mention of complication, uncontrolled 11/29/2013   Past Surgical History:  Procedure Laterality Date    CHOLECYSTECTOMY N/A 09/19/2015   Procedure: LAPAROSCOPIC CHOLECYSTECTOMY WITH INTRAOPERATIVE CHOLANGIOGRAM;  Surgeon: Avel Peace, MD;  Location: [facility] OR;  Service: General;  Laterality: N/A;   COLONOSCOPY     TONSILLECTOMY  1956   VIDEO BRONCHOSCOPY Bilateral 03/28/2014   Procedure: VIDEO BRONCHOSCOPY WITHOUT FLUORO;  Surgeon: Nyoka Cowden, MD;  Location: [facility] ENDOSCOPY;  Service: Cardiopulmonary;  Laterality: Bilateral;   Family History  Problem Relation Age of Onset   Cancer Mother        ? type    Diabetes Father    Hypertension Father    Heart disease Father    Asthma Father    Asthma Daughter    Colon cancer Neg Hx    Esophageal cancer Neg Hx    Rectal cancer Neg Hx    Stomach cancer Neg Hx    Breast cancer Neg Hx    Social History:  reports that she quit smoking about 18 years ago. Her smoking use included cigarettes. She has never used smokeless tobacco. She reports that she does not drink alcohol and does not use drugs. Allergies:  Allergies  Allergen Reactions   Shingrix [Zoster Vac Recomb Adjuvanted]     Allergy to original live zoster - rash   Medications Prior to Admission  Medication Sig Dispense Refill   albuterol (PROAIR HFA) 108 (90 Base) MCG/ACT inhaler INHALE 2 PUFFS BY MOUTH EVERY 6 HOURS AS NEEDED FOR WHEEZING OR SHORTNESS OF BREATH (Patient taking differently: Inhale 2 puffs into the lungs every 6 (six) hours as needed for wheezing or shortness of breath.) 8.5 g 5   aspirin 81 MG tablet Take 81 mg by mouth every morning.     empagliflozin (JARDIANCE) 25 MG TABS tablet Take 1 tablet (25 mg total) by mouth daily. 90 tablet 3   insulin glargine, 2 Unit Dial, (TOUJEO MAX SOLOSTAR) 300 UNIT/ML Solostar Pen ADMINISTER 100 UNITS UNDER THE SKIN DAILY BEFORE AND SUPPER 6 mL 5   insulin regular human CONCENTRATED (HUMULIN R U-500 KWIKPEN) 500 UNIT/ML KwikPen ADMINISTER 60 UNITS UNDER THE SKIN 30 MINUTES BEFORE MEALS (Patient taking differently: Inject 60 Units into the  skin 3 (three) times daily with meals.) 6 mL 3   losartan (COZAAR) 100 MG tablet Take 1 tablet (100 mg total) by mouth daily. 90 tablet 3   Multiple Vitamin (MULTIVITAMIN WITH MINERALS) TABS tablet Take 1 tablet by mouth daily.     potassium chloride (KLOR-CON) 10 MEQ tablet 3 tab by mouth once daily 270 tablet 3   rosuvastatin (CRESTOR) 20 MG tablet 1 tab by mouth once daily 90 tablet 3   Blood Glucose Monitoring Suppl (ONE TOUCH ULTRA 2) w/Device KIT Use as directed 1 each 0   Lancets MISC Use as directed four times daily  E11.9 400 each 11   ONETOUCH VERIO test strip USE AS DIRECTED THREE TIMES DAILY 100 strip 4   ONETOUCH VERIO test strip USE TO CHECK BLOOD SUGARS THREE TIMES DAILY 300 strip 3   tiZANidine (ZANAFLEX) 2 MG tablet TAKE 1 TABLET(2 MG) BY MOUTH EVERY 6 HOURS AS NEEDED FOR MUSCLE SPASMS (Patient taking differently: Take 2 mg by mouth every 6 (six) hours as needed for muscle spasms.) 30 tablet 1      Home: Home Living Family/patient expects to be discharged to:: Private residence Living Arrangements:  (grand daughter, Benetta Spar, lives with patient. Margaret's daughter is [place]) Available  Help at Discharge: Family, Available 24 hours/day Claris Che and [place] to provide 24/7 care) Type of Home: House Home Access: Stairs to enter [employer] of Steps: 4 Entrance Stairs-Rails: Right Home Layout: One level Bathroom Shower/Tub: Engineer, manufacturing systems: Standard Bathroom Accessibility: Yes Home Equipment: None Additional Comments: Ive explained  to family that patient does not have medicaid  to provide PCS services; Claris Che to reapply  Lives With: Other (Comment) (granddaughter, [place], lives with patient)   Functional History: Prior Function Prior Level of Function : Independent/Modified Independent Mobility Comments: reports no AD use ADLs Comments: cooks and cleans, does not drive  Functional Status:  Mobility: Bed Mobility Overal bed  mobility: Needs Assistance Bed Mobility: Supine to Sit Supine to sit: HOB elevated, Min assist Sit to supine: Min assist General bed mobility comments: increased time and effort required Transfers Overall transfer level: Needs assistance Equipment used: Rolling walker (2 wheels) Transfers: Sit to/from Stand Sit to Stand: Min assist Bed to/from chair/wheelchair/BSC transfer type:: Stand pivot Stand pivot transfers: Mod assist General transfer comment: lifting assistance required to stand Ambulation/Gait Ambulation/Gait assistance: Min assist Gait Distance (Feet): 75 Feet Assistive device: Rolling walker (2 wheels) Gait Pattern/deviations: Trunk flexed, Decreased stride length General Gait Details: decreased foot clearance bilaterally. cues to increase step length/height and to stand closer to rolling walker for support. steadying assistance required with ambulation as well as occasional assistance for rolling walker negotiation, expecially around obstacles and with turns. Sp02 remained in the upper 90's with ambulation on 3 L02. activity tolerance limited by fatigue and one standing rest break required Gait velocity: decreased Gait velocity interpretation: <1.31 ft/sec, indicative of household ambulator Pre-gait activities: marching in place    ADL: ADL Overall ADL's : Needs assistance/impaired Eating/Feeding: Set up, Sitting Grooming: Min guard, Standing Upper Body Bathing: Moderate assistance Lower Body Bathing: Moderate assistance Upper Body Dressing : Minimal assistance Lower Body Dressing: Moderate assistance Toilet Transfer: Min guard, Rolling walker (2 wheels), Ambulation, Regular Toilet Toileting- Clothing Manipulation and Hygiene: Maximal assistance Toileting - Clothing Manipulation Details (indicate cue type and reason): pericare Functional mobility during ADLs: Min guard, Rolling walker (2 wheels)  Cognition: Cognition Overall Cognitive Status: Within Functional  Limits for tasks assessed Orientation Level: Oriented X4 Cognition Arousal/Alertness: Awake/alert Behavior During Therapy: Flat affect Overall Cognitive Status: Within Functional Limits for tasks assessed General Comments: slow to respond at times and needs cues for safety with mobility tasks. slow initiation as well  Physical Exam: Blood pressure 133/69, pulse 93, temperature 98.3 F (36.8 C), temperature source Oral, resp. rate 18, height 5\' 3"  (1.6 m), weight 77.7 kg, SpO2 100 %. Physical Exam Vitals and nursing note reviewed. Exam conducted with a chaperone present.  Constitutional:      General: She is not in acute distress.    Appearance: Normal appearance. She is obese. She is not ill-appearing.     Comments: Awake, alert, appropriate, sitting up in bed watching TV, daughter Andrey Campanile at bedside, NAD  HENT:     Head: Normocephalic and atraumatic.     Comments: No facial droop; tongue midline- not coated Facial sensation intact ot light touch B/L    Right Ear: External ear normal.     Left Ear: External ear normal.     Nose: Nose normal. No congestion.     Mouth/Throat:     Mouth: Mucous membranes are moist.     Pharynx: Oropharynx is clear. No oropharyngeal exudate.  Eyes:     General:  Right eye: No discharge.        Left eye: No discharge.     Extraocular Movements: Extraocular movements intact.  Neck:     Comments: Some irritation/rash of skin around anterior neck- not open Cardiovascular:     Rate and Rhythm: Normal rate and regular rhythm.     Heart sounds: Normal heart sounds. No murmur heard.    No gallop.  Pulmonary:     Effort: Pulmonary effort is normal. No respiratory distress.     Breath sounds: Normal breath sounds. No wheezing, rhonchi or rales.  Abdominal:     General: Bowel sounds are normal. There is no distension.     Palpations: Abdomen is soft.     Tenderness: There is no abdominal tenderness.  Musculoskeletal:     Cervical back: Neck supple.  No tenderness.     Comments: 5/5 in B/L UE/arms- biceps, triceps, WE, grip and FA 5-/5 in B/L LE's- HF, KE, KF, DF and PF 5/5 B/L  Skin:    General: Skin is warm and dry.     Comments: R forearm IV- looks OK Neck irritation as above No significant LE edema- maybe trace in feet/ankles B/L  Neurological:     Mental Status: She is alert and oriented to person, place, and time.     Comments: Intact to light touch in all 4 extremities Ox3- able to answer 2-3 steps questions and follow 2 step commands   Psychiatric:        Mood and Affect: Mood normal.        Behavior: Behavior normal.     Results for orders placed or performed during the hospital encounter of 04/09/22 (from the past 48 hour(s))  Glucose, capillary     Status: Abnormal   Collection Time: 04/21/22  4:03 PM  Result Value Ref Range   Glucose-Capillary 286 (H) 70 - 99 mg/dL    Comment: Glucose reference range applies only to samples taken after fasting for at least 8 hours.  Glucose, capillary     Status: Abnormal   Collection Time: 04/21/22  9:39 PM  Result Value Ref Range   Glucose-Capillary 384 (H) 70 - 99 mg/dL    Comment: Glucose reference range applies only to samples taken after fasting for at least 8 hours.  Heparin level (unfractionated)     Status: Abnormal   Collection Time: 04/21/22 10:38 PM  Result Value Ref Range   Heparin Unfractionated 0.83 (H) 0.30 - 0.70 IU/mL    Comment: (NOTE) The clinical reportable range upper limit is being lowered to >1.10 to align with the FDA approved guidance for the current laboratory assay.  If heparin results are below expected values, and patient dosage has  been confirmed, suggest follow up testing of antithrombin III levels. Performed at [facility], [address], [place], [place] [address]   Glucose, capillary     Status: Abnormal   Collection Time: 04/22/22  7:30 AM  Result Value Ref Range   Glucose-Capillary 312 (H) 70 - 99 mg/dL    Comment: Glucose  reference range applies only to samples taken after fasting for at least 8 hours.  Basic metabolic panel     Status: Abnormal   Collection Time: 04/22/22  8:05 AM  Result Value Ref Range   Sodium 142 135 - 145 mmol/L   Potassium 3.5 3.5 - 5.1 mmol/L   Chloride 104 98 - 111 mmol/L   CO2 29 22 - 32 mmol/L   Glucose,  Bld 352 (H) 70 - 99 mg/dL    Comment: Glucose reference range applies only to samples taken after fasting for at least 8 hours.   BUN 23 8 - 23 mg/dL   Creatinine, Ser 1.61 (H) 0.44 - 1.00 mg/dL   Calcium 8.5 (L) 8.9 - 10.3 mg/dL   GFR, Estimated 49 (L) >60 mL/min    Comment: (NOTE) Calculated using the CKD-EPI Creatinine Equation (2021)    Anion gap 9 5 - 15    Comment: Performed at [facility], [address], [place], [place] [address]  CBC     Status: Abnormal   Collection Time: 04/22/22  8:05 AM  Result Value Ref Range   WBC 9.3 4.0 - 10.5 K/uL   RBC 3.33 (L) 3.87 - 5.11 MIL/uL   Hemoglobin 7.9 (L) 12.0 - 15.0 g/dL    Comment: Reticulocyte Hemoglobin testing may be clinically indicated, consider ordering this additional test [id number]    HCT 24.9 (L) 36.0 - 46.0 %   MCV 74.8 (L) 80.0 - 100.0 fL   MCH 23.7 (L) 26.0 - 34.0 pg   MCHC 31.7 30.0 - 36.0 g/dL   RDW 14.7 82.9 - 56.2 %   Platelets 297 150 - 400 K/uL   nRBC 0.0 0.0 - 0.2 %    Comment: Performed at [facility], [address] [place], [place] [address]  Heparin level (unfractionated)     Status: Abnormal   Collection Time: 04/22/22  8:05 AM  Result Value Ref Range   Heparin Unfractionated 0.83 (H) 0.30 - 0.70 IU/mL    Comment: (NOTE) The clinical reportable range upper limit is being lowered to >1.10 to align with the FDA approved guidance for the current laboratory assay.  If heparin results are below expected values, and patient dosage has  been confirmed, suggest follow up testing of antithrombin III levels. Performed at [facility], [address] [place],  [place] [address]   Magnesium     Status: None   Collection Time: 04/22/22  8:05 AM  Result Value Ref Range   Magnesium 1.8 1.7 - 2.4 mg/dL    Comment: Performed at [facility], [address], [place], [place] [address]  Glucose, capillary     Status: Abnormal   Collection Time: 04/22/22 12:24 PM  Result Value Ref Range   Glucose-Capillary 311 (H) 70 - 99 mg/dL    Comment: Glucose reference range applies only to samples taken after fasting for at least 8 hours.  Glucose, capillary     Status: Abnormal   Collection Time: 04/22/22  4:59 PM  Result Value Ref Range   Glucose-Capillary 185 (H) 70 - 99 mg/dL    Comment: Glucose reference range applies only to samples taken after fasting for at least 8 hours.  Heparin level (unfractionated)     Status: None   Collection Time: 04/22/22  7:22 PM  Result Value Ref Range   Heparin Unfractionated 0.65 0.30 - 0.70 IU/mL    Comment: (NOTE) The clinical reportable range upper limit is being lowered to >1.10 to align with the FDA approved guidance for the current laboratory assay.  If heparin results are below expected values, and patient dosage has  been confirmed, suggest follow up testing of antithrombin III levels. Performed at [facility], [address], [place], [place] [address]   Glucose, capillary     Status: Abnormal   Collection Time: 04/22/22  9:41 PM  Result  Value Ref Range   Glucose-Capillary 141 (H) 70 - 99 mg/dL    Comment: Glucose reference range applies only to samples taken after fasting for at least 8 hours.  Magnesium     Status: None   Collection Time: 04/23/22  4:36 AM  Result Value Ref Range   Magnesium 1.7 1.7 - 2.4 mg/dL    Comment: Performed at [facility], [address], [place], [place] [address]  Folate     Status: None   Collection Time: 04/23/22  4:36 AM  Result Value Ref Range   Folate 16.3 >5.9 ng/mL    Comment: Performed at [facility], [address] [place], [place] [address]   Ferritin     Status: Abnormal   Collection Time: 04/23/22  4:36 AM  Result Value Ref Range   Ferritin 453 (H) 11 - 307 ng/mL    Comment: Performed at [facility], [address] [place], [place] [address]  Iron and TIBC     Status: Abnormal   Collection Time: 04/23/22  4:36 AM  Result Value Ref Range   Iron 46 28 - 170 ug/dL   TIBC 696 (L) 295 - 284 ug/dL   Saturation Ratios 19 10.4 - 31.8 %   UIBC 203 ug/dL    Comment: Performed at [facility], [address], [place], [place] [address]  Phosphorus     Status: Abnormal   Collection Time: 04/23/22  4:36 AM  Result Value Ref Range   Phosphorus 2.2 (L) 2.5 - 4.6 mg/dL    Comment: Performed at [facility], [address] [place], [place] [address]  CBC with Differential/Platelet     Status: Abnormal   Collection Time: 04/23/22  4:36 AM  Result Value Ref Range   WBC 7.4 4.0 - 10.5 K/uL   RBC 3.23 (L) 3.87 - 5.11 MIL/uL   Hemoglobin 7.6 (L) 12.0 - 15.0 g/dL    Comment: Reticulocyte Hemoglobin testing may be clinically indicated, consider ordering this additional test [id number]    HCT 24.6 (L) 36.0 - 46.0 %   MCV 76.2 (L) 80.0 - 100.0 fL   MCH 23.5 (L) 26.0 - 34.0 pg   MCHC 30.9 30.0 - 36.0 g/dL   RDW 34.7 42.5 - 95.6 %   Platelets 217 150 - 400 K/uL   nRBC 0.3 (H) 0.0 - 0.2 %   Neutrophils Relative % 71 %   Neutro Abs 5.3 1.7 - 7.7 K/uL   Lymphocytes Relative 18 %   Lymphs Abs 1.3 0.7 - 4.0 K/uL   Monocytes Relative 6 %   Monocytes Absolute 0.5 0.1 - 1.0 K/uL   Eosinophils Relative 2 %   Eosinophils Absolute 0.1 0.0 - 0.5 K/uL   Basophils Relative 1 %   Basophils Absolute 0.0 0.0 - 0.1 K/uL   Immature Granulocytes 2 %   Abs Immature Granulocytes 0.16 (H) 0.00 - 0.07 K/uL    Comment: Performed at [facility], [address] [place], [place] [address]  Comprehensive metabolic panel     Status: Abnormal   Collection Time: 04/23/22  4:36 AM  Result Value Ref Range   Sodium 140 135 - 145  mmol/L   Potassium 3.8 3.5 - 5.1 mmol/L   Chloride 102 98 - 111 mmol/L   CO2 28 22 - 32 mmol/L   Glucose, Bld 333 (H) 70 - 99 mg/dL    Comment: Glucose reference range applies only to  samples taken after fasting for at least 8 hours.   BUN 19 8 - 23 mg/dL   Creatinine, Ser 1.47 (H) 0.44 - 1.00 mg/dL   Calcium 8.6 (L) 8.9 - 10.3 mg/dL   Total Protein 5.9 (L) 6.5 - 8.1 g/dL   Albumin 2.3 (L) 3.5 - 5.0 g/dL   AST 29 15 - 41 U/L   ALT 50 (H) 0 - 44 U/L   Alkaline Phosphatase 119 38 - 126 U/L   Total Bilirubin 0.4 0.3 - 1.2 mg/dL   GFR, Estimated 55 (L) >60 mL/min    Comment: (NOTE) Calculated using the CKD-EPI Creatinine Equation (2021)    Anion gap 10 5 - 15    Comment: Performed at [facility], [address] [place], [place] [address]  Heparin level (unfractionated)     Status: None   Collection Time: 04/23/22  4:36 AM  Result Value Ref Range   Heparin Unfractionated 0.38 0.30 - 0.70 IU/mL    Comment: (NOTE) The clinical reportable range upper limit is being lowered to >1.10 to align with the FDA approved guidance for the current laboratory assay.  If heparin results are below expected values, and patient dosage has  been confirmed, suggest follow up testing of antithrombin III levels. Performed at [facility], [address] [place], [place] [address]   Glucose, capillary     Status: Abnormal   Collection Time: 04/23/22  7:59 AM  Result Value Ref Range   Glucose-Capillary 329 (H) 70 - 99 mg/dL    Comment: Glucose reference range applies only to samples taken after fasting for at least 8 hours.  Glucose, capillary     Status: Abnormal   Collection Time: 04/23/22 11:59 AM  Result Value Ref Range   Glucose-Capillary 239 (H) 70 - 99 mg/dL    Comment: Glucose reference range applies only to samples taken after fasting for at least 8 hours.   CT Angio Chest Pulmonary Embolism (PE) W or WO Contrast  Addendum Date: 04/22/2022   ADDENDUM REPORT: 04/22/2022  15:42 ADDENDUM: Imaging finding of small thrombus burden PE in right lower lobe and right heart dysfunction were relayed to Dr. Lowell Guitar by telephone call. Electronically Signed   By: Ernie Avena M.D.   On: 04/22/2022 15:42   Result Date: 04/22/2022 CLINICAL DATA:  Cardiac arrest, abnormal echocardiogram EXAM: CT ANGIOGRAPHY CHEST WITH CONTRAST TECHNIQUE: Multidetector CT imaging of the chest was performed using the standard protocol during bolus administration of intravenous contrast. Multiplanar CT image reconstructions and MIPs were obtained to evaluate the vascular anatomy. RADIATION DOSE REDUCTION: This exam was performed according to the departmental dose-optimization program which includes automated exposure control, adjustment of the mA and/or kV according to patient size and/or use of iterative reconstruction technique. CONTRAST:  75mL OMNIPAQUE IOHEXOL 350 MG/ML SOLN COMPARISON:  Noncontrast CT chest done on 04/16/2022 and chest radiographs done on 04/19/2022 FINDINGS: Cardiovascular: There are no intraluminal filling defects in central pulmonary artery branches. In image 54 of series 5, filling defect is seen in is subsegmental pulmonary artery branch in the posterior right lower lung field. Motion artifacts limit evaluation of small peripheral pulmonary artery branches. Coronary artery calcifications are seen. There is homogeneous enhancement in thoracic aorta. There are scattered calcifications in thoracic aorta and its major branches. Small pericardial effusion is present. RV LV ratio is 1.3. Mediastinum/Nodes: There are enlarged lymph nodes in both hilar regions and mediastinum with no interval change. Lungs/Pleura: There is interval removal of  endotracheal tube and enteric tube. There is interval improvement in aeration in both mid and lower lung fields. There is interval worsening of infiltrates in right upper lobe. There is linear focus of dense infiltrate in the medial left upper lobe,  possibly chronic atelectasis. Residual patchy infiltrates are noted in right lower lobe. Small to moderate bilateral pleural effusions are seen, more so on the left side. There is interval decrease in amount of bilateral pleural effusions. There is no pneumothorax. Upper Abdomen: Surgical clips are seen in gallbladder fossa. There is 5 mm calcific density in the upper pole of right kidney. Musculoskeletal: No acute findings are seen. Review of the MIP images confirms the above findings. IMPRESSION: There is intraluminal filling defect in small subsegmental branch in right lower lobe suggesting pulmonary embolism with very small thrombus burden. No other definite intraluminal filling defects are seen. RV LV ratio is 1.3 which may suggest chronic right heart dysfunction. Small pericardial effusion is present. Patchy infiltrates are seen in both lungs with areas of improvement and areas of worsening since 04/16/2022. Findings suggest multifocal pneumonia. There is dense atelectasis in the medial left upper lobe which has not changed significantly. There is interval decrease in amount of bilateral pleural effusions. Coronary artery disease.  Right renal calculus. Other findings as described in the body of the report. Electronically Signed: By: Ernie Avena M.D. On: 04/22/2022 15:32    Narrative & Impression  CLINICAL DATA:  Shortness of breath.  Cardiac arrest.   EXAM: PORTABLE CHEST 1 VIEW   COMPARISON:  One-view chest x-ray 04/24/2022. CT angio chest 04/22/2022   FINDINGS: Heart is enlarged. Overall lung volumes have improved. Interstitial and airspace opacities are improving on the right. Areas of atelectasis are present on the left. Moderate pulmonary vascular congestion remains.   IMPRESSION: 1. Improving interstitial and airspace opacities consistent with improving infection and edema. 2. Cardiomegaly and moderate pulmonary vascular congestion.     Electronically Signed   By:  Marin Roberts M.D.   On: 04/25/2022 10:23      Blood pressure 133/69, pulse 93, temperature 98.3 F (36.8 C), temperature source Oral, resp. rate 18, height 5\' 3"  (1.6 m), weight 77.7 kg, SpO2 100 %.  Medical Problem List and Plan: 1. Functional deficits secondary to debility from cardiac arrest with CPR >2 minutes-   -patient may  shower  -ELOS/Goals: 12-16 days- mod I to supervision 2.  Antithrombotics: -DVT/anticoagulation:  Pharmaceutical: Eliquis   -antiplatelet therapy: none  3. Pain Management: Tylenol as needed  Sore ribs from CPR- might benefit from Lidoderm patches? 4. Mood/Behavior/Sleep: LCSW to evaluate and provide emotional support  -antipsychotic agents: n/a  5. Neuropsych/cognition: This patient is capable of making decisions on her own behalf. Doesn't have signs of anoxia- but will order SLP for cognition just to make sure don't miss some mild issues  6. Skin/Wound Care: Routine skin care checks  7. Fluids/Electrolytes/Nutrition: routine Is and Os -carb modified diet -K phos 500 mg TID -Lasix continues for diffuse edema - edema almost resolved in legs- will follow IM recs on when to stop Lasix- maybe earlier if improves. 8: Cardiac arrest, suspected PE s/p tenecteplase  -follow-up with Dr. Lowell Guitar  9: Seizure-like activity: Keppra discontinued  10: AKI: creatinine at baseline; follow-up BMP  11: Respiratory failure/pneumonia: completed antibiotics  -pulmonary toilet; albuterol nebs as needed  -Duoneb BID  12: Bilateral pleural effusions: repeat chest x-ray on 12/30>>improvement (see report above)  -diuresis continues with Lasix 40 mg daily  13: Anemia: Hemoglobin with downward trend: 7.6; recheck CBC   14: Rib fractures: pain control  15: Hypertension: monitor TID and prn  continue  Lasix 40 mg daily  -continue Norvasc 7.5 mg daily  16: DM/DKA: Ca1c >15.5 BGs q AC and q HS; (home meds: Jardiance 25 mg daily, Toujeo 100 units daily, Humulin R  U500 60 units TID with meals)   -SSI  -Novolog 8 units with meals  -Semglee 18 units BID  17: Left upper lobe atelectasis: Concern for chronic obstruction of left upper lobe bronchus  -Recommend follow-up CT scan in 3 months versus other imaging  18: Hypokalemia: replete; follow-up BMP  19: Hyperlipidemia: Crestor  20: Hypomagnesemia: continue Mag-ox 400 mg daily; recheck mag 1/1  21: Elevated TSH: T3, T4 pending   I have personally performed a face to face diagnostic evaluation of this patient and formulated the key components of the plan.  Additionally, I have personally reviewed laboratory data, imaging studies, as well as relevant notes and concur with the physician assistant's documentation above.   The patient's status has not changed from the original H&P.  Any changes in documentation from the acute care chart have been noted above.     Milinda Antis, PA-C 04/23/2022

## 2022-04-23 NOTE — Progress Notes (Signed)
  Echocardiogram 2D Echocardiogram has been performed.  Johny Chess 04/23/2022, 3:26 PM

## 2022-04-23 NOTE — Consult Note (Signed)
Cardiology Consultation   Patient ID: MACKENZEE BECVAR MRN: [id number]; DOB: 1951-01-06  Admit date: 04/09/2022 Date of Consult: 04/23/2022  PCP:  Biagio Borg, MD   [facility] Providers Cardiologist:  None     Patient Profile:   Deborah Neal is a 71 y.o. female with a hx of HTN, HLD, DM2, asthma who is being seen 04/23/2022 for the evaluation of cardiac arrest on 04/09/22 at the request of Dr. Florene Glen. She was found to have concern for clot in transit on intake echo, presumed to have massive PE s/p TNKase.  History of Present Illness:   Ms. Herbig has no prior cardiac history. Events of presenting day reviewed. She was in her USOH until approximately 5:30am when last seen by granddaguther. Family attempted to call her around 7am and she did not answer. When they went to check on her around 10am, she was unresponsive in bed with red emesis around her. Upon fire arrival, she was unresponsive but did have pulse/respirations though began having seizure activity. Upon EMS arrival she lost pulses and CPR was initiated, <5 minutes. She had seizure like activity en route to [facility] requiring benzodiazepine administration. Upon arrival to ED she was intubated. She remained unresponsive and PCCM was asked to admit. Presenting EKG showed sinus tach with prolonged QT interval, nonspecific STTW changes, no STEMI. 2D echo showed mobile echodensity in the RV outflow tract concerning for clot in transit/PE, otherwise with normal EF 65-70%, G1DD, mild LVH, mildly reduced RVSF, mildly elevated PASP. Initial Cr 2.06, glucose 1194, K 3.3, lactate >9, WBC 21k, troponins 97->1355->1133 (A1c >15.5). CTA chest not able to be pursued due to renal dysfunction. She was initially started on heparin due to concern for massive PE with low threshold for lytics for decompensation. That evening she dropped her BP to the 40s requiring Levophed then went into cardiac arrest with VT/VF per notes, ROSC achieved after 2  minutes. She was subsequently treated with TNKase. LE [place] 03/2020 was negative for DVT. Other hospital issues this admission include acute hypoxic respiratory failure/multifocal PNA with serratia marcescens, aspiration PNA, bilateral pleural effusions, AKI with creatinine peak of 5.64 requiring nephrology involvement for diuretics with diffuse body wall edema, hypoalbuminemia, anemia, hematuria, concern for obstruction of LUL bronchus (to be followed in future with CT versus PET), hypernatremia, hypokalemia, and DKA. She remains on heparin drip at this time and [place] O2 at this time, hemodynamically stable otherwise. Cr now down to 1.08, Hgb remains low at 7.6. CT angio was able to be completed yesterday showing intraluminal filling defect in small subsegmental branch in right lower lobe suggesting pulmonary embolism with very small thrombus burden, RV/LV ratio 1.3 suggesting right heart dysfunction, + small pericardial effusion, patchy infiltrates suggesting multifocal PNA and dense atx in LUL, interval decrease in pleural effusions, + coronary/aortic atherosclerosis seen ("homogeneous enhancement in thoracic aorta"?). FOBT neg, CT 12/19 neg for [facility].   She tells me today that she has no pain except chest wall pain from rib fractures. No dyspnea.    Past Medical History:  Diagnosis Date   ALLERGIC RHINITIS 01/29/2007   Arthritis    ASTHMA 01/29/2007   ASTHMA, WITH ACUTE EXACERBATION 06/24/2008   BACK PAIN 01/29/2009   CHEST PAIN 06/24/2008   Family history of adverse reaction to anesthesia    Patients daughter has N/V after anesthesia   GERD (gastroesophageal reflux disease)    History of shingles    HYPERLIPIDEMIA 01/29/2007   HYPERTENSION 01/29/2007  OSTEOPENIA 10/16/2007   Type II or unspecified type diabetes mellitus without mention of complication, uncontrolled 11/29/2013    Past Surgical History:  Procedure Laterality Date   CHOLECYSTECTOMY N/A 09/19/2015   Procedure: LAPAROSCOPIC CHOLECYSTECTOMY  WITH INTRAOPERATIVE CHOLANGIOGRAM;  Surgeon: Jackolyn Confer, MD;  Location: [facility];  Service: General;  Laterality: N/A;   COLONOSCOPY     TONSILLECTOMY  1956   VIDEO BRONCHOSCOPY Bilateral 03/28/2014   Procedure: VIDEO BRONCHOSCOPY WITHOUT FLUORO;  Surgeon: Tanda Rockers, MD;  Location: [facility];  Service: Cardiopulmonary;  Laterality: Bilateral;     Home Medications:  Prior to Admission medications   Medication Sig Start Date End Date Taking? Authorizing Provider  albuterol (PROAIR HFA) 108 (90 Base) MCG/ACT inhaler INHALE 2 PUFFS BY MOUTH EVERY 6 HOURS AS NEEDED FOR WHEEZING OR SHORTNESS OF BREATH Patient taking differently: Inhale 2 puffs into the lungs every 6 (six) hours as needed for wheezing or shortness of breath. 01/22/22  Yes Biagio Borg, MD  aspirin 81 MG tablet Take 81 mg by mouth every morning.   Yes [provider]  empagliflozin (JARDIANCE) 25 MG TABS tablet Take 1 tablet (25 mg total) by mouth daily. 01/22/22  Yes Biagio Borg, MD  insulin glargine, 2 Unit Dial, (TOUJEO MAX SOLOSTAR) 300 UNIT/ML Solostar Pen ADMINISTER 100 UNITS UNDER THE SKIN DAILY BEFORE AND SUPPER 01/22/22  Yes Biagio Borg, MD  insulin regular human CONCENTRATED (HUMULIN R U-500 KWIKPEN) 500 UNIT/ML KwikPen ADMINISTER 60 UNITS UNDER THE SKIN 30 MINUTES BEFORE MEALS Patient taking differently: Inject 60 Units into the skin 3 (three) times daily with meals. 01/22/22  Yes Biagio Borg, MD  losartan (COZAAR) 100 MG tablet Take 1 tablet (100 mg total) by mouth daily. 01/22/22  Yes Biagio Borg, MD  Multiple Vitamin (MULTIVITAMIN WITH MINERALS) TABS tablet Take 1 tablet by mouth daily.   Yes [provider]  potassium chloride (KLOR-CON) 10 MEQ tablet 3 tab by mouth once daily 01/22/22  Yes Biagio Borg, MD  rosuvastatin (CRESTOR) 20 MG tablet 1 tab by mouth once daily 01/22/22  Yes Biagio Borg, MD  Blood Glucose Monitoring Suppl (ONE TOUCH ULTRA 2) w/Device KIT Use as directed 07/27/16    Biagio Borg, MD  Lancets MISC Use as directed four times daily  E11.9 03/29/17   Biagio Borg, MD  [facility] VERIO test strip USE AS DIRECTED THREE TIMES DAILY 09/17/19   Elayne Snare, MD  [facility] VERIO test strip USE TO CHECK BLOOD SUGARS THREE TIMES DAILY 04/28/21   Biagio Borg, MD  tiZANidine (ZANAFLEX) 2 MG tablet TAKE 1 TABLET(2 MG) BY MOUTH EVERY 6 HOURS AS NEEDED FOR MUSCLE SPASMS Patient taking differently: Take 2 mg by mouth every 6 (six) hours as needed for muscle spasms. 01/30/20   Biagio Borg, MD    Inpatient Medications: Scheduled Meds:  amLODipine  7.5 mg Oral Daily   Chlorhexidine Gluconate Cloth  6 each Topical Daily   feeding supplement  237 mL Oral TID WC   furosemide  40 mg Intravenous Daily   insulin aspart  0-15 Units Subcutaneous TID WC   insulin aspart  5 Units Subcutaneous TID WC   insulin glargine-yfgn  10 Units Subcutaneous BID   magnesium oxide  400 mg Oral Daily   multivitamin with minerals  1 tablet Oral Daily   pantoprazole (PROTONIX) IV  40 mg Intravenous QHS   phosphorus  500 mg Oral TID AC & HS  Continuous Infusions:  sodium chloride Stopped (04/17/22 1020)   ceFEPime (MAXIPIME) IV 2 g (04/23/22 0855)   heparin 1,150 Units/hr (04/22/22 2026)   PRN Meds: acetaminophen **OR** acetaminophen (TYLENOL) oral liquid 160 mg/5 mL **OR** acetaminophen, albuterol, artificial tears, docusate, lip balm, ondansetron (ZOFRAN) IV, mouth rinse, polyethylene glycol  Allergies:    Allergies  Allergen Reactions   Shingrix [Zoster Vac Recomb Adjuvanted]     Allergy to original live zoster - rash    Social History:   Social History   Socioeconomic History   Marital status: Single    Spouse name: Not on file   Number of children: Not on file   Years of education: Not on file   Highest education level: Not on file  Occupational History   Not on file  Tobacco Use   Smoking status: Former    Types: Cigarettes    Quit date: 09/07/2003    Years since  quitting: 18.6   Smokeless tobacco: Never  Vaping Use   Vaping Use: Never used  Substance and Sexual Activity   Alcohol use: No   Drug use: No   Sexual activity: Never  Other Topics Concern   Not on file  Social History Narrative   Not on file   Social Determinants of Health   Financial Resource Strain: Not on file  Food Insecurity: Not on file  Transportation Needs: Not on file  Physical Activity: Not on file  Stress: Not on file  Social Connections: Not on file  Intimate Partner Violence: Not on file    Family History:   Family History  Problem Relation Age of Onset   Cancer Mother        ? type    Diabetes Father    Hypertension Father    Heart disease Father    Asthma Father    Asthma Daughter    Colon cancer Neg Hx    Esophageal cancer Neg Hx    Rectal cancer Neg Hx    Stomach cancer Neg Hx    Breast cancer Neg Hx      ROS:  Please see the history of present illness.  All other ROS reviewed and negative.     Physical Exam/Data:   Vitals:   04/23/22 0400 04/23/22 0630 04/23/22 0901 04/23/22 0933  BP: (!) 142/68  120/85   Pulse: 80  74   Resp: 16  18   Temp: 98 F (36.7 C)  97.9 F (36.6 C)   TempSrc: Oral  Oral   SpO2: 98%  100% 100%  Weight:  77.7 kg    Height:        Intake/Output Summary (Last 24 hours) at 04/23/2022 1120 Last data filed at 04/23/2022 0900 Gross per 24 hour  Intake 1252.29 ml  Output 3475 ml  Net -2222.71 ml      04/23/2022    6:30 AM 04/22/2022    4:26 AM 04/21/2022    4:22 AM  Last 3 Weights  Weight (lbs) 171 lb 4.8 oz 171 lb 1.2 oz 178 lb 5.6 oz  Weight (kg) 77.7 kg 77.6 kg 80.9 kg     Body mass index is 30.34 kg/m.  General: Well developed, well nourished, in no acute distress. Head: Normocephalic, atraumatic Neck: JVP not elevated. Lungs: Clear bilaterally to auscultation without wheezes, rales, or rhonchi. Breathing is unlabored. Heart: RRR S1 S2 without murmurs, rubs, or gallops.  Abdomen:  Soft Extremities: No clubbing or cyanosis. No LE edema. Distal pedal pulses are  2+ and equal bilaterally. Neuro: Alert and oriented X 3. Moves all extremities spontaneously. Psych:  Responds to questions appropriately with a normal affect.  EKG:  The EKG was personally reviewed and demonstrates:   04/09/22: sinus tachycardia 115bpm, LAFB, prolonged QTC 523m, nonspecific STTW changes, left axis deviation 04/21/22: NSR 90bpm, nonspecific TW changes, QTC 4186m Telemetry:  Telemetry was personally reviewed and demonstrates:  Sinus  Relevant CV Studies: 2d Echo 04/09/22  1. There appears to be a mobile echodensity in the RV outflow tract  (images 45-51), not well visualized but given presentation with cardiac  arrest, concerning for thrombus and recommend PE evaluation. RV function  appears mildly reduced with free wall  hypokinesis   2. Left ventricular ejection fraction, by estimation, is 65 to 70%. The  left ventricle has normal function. The left ventricle has no regional  wall motion abnormalities. There is mild left ventricular hypertrophy.  Left ventricular diastolic parameters  are consistent with Grade I diastolic dysfunction (impaired relaxation).   3. Right ventricular systolic function is mildly reduced. The right  ventricular size is normal. There is mildly elevated pulmonary artery  systolic pressure.   4. The mitral valve is normal in structure. Trivial mitral valve  regurgitation.   5. The aortic valve is tricuspid. Aortic valve regurgitation is not  visualized. No aortic stenosis is present.   6. The inferior vena cava is normal in size with greater than 50%  respiratory variability, suggesting right atrial pressure of 3 mmHg.   LE venous duplex 04/15/22   Laboratory Data:  High Sensitivity Troponin:   Recent Labs  Lab 04/09/22 1330 04/10/22 1000 04/10/22 1200  TROPONINIHS 97* 1,355* 1,133*     Chemistry Recent Labs  Lab 04/21/22 0529 04/22/22 0805  04/23/22 0436  NA 141 142 140  K 3.1* 3.5 3.8  CL 103 104 102  CO2 _0 GLUCOSE 297* 352* 333*  BUN _1 CREATININE 1.43* 1.20* 1.08*  CALCIUM 8.5* 8.5* 8.6*  MG 1.8 1.8 1.7  GFRNONAA 39* 49* 55*  ANIONGAP _2 Recent Labs  Lab 04/21/22 1222 04/23/22 0436  PROT  --  5.9*  ALBUMIN 2.6* 2.3*  AST  --  29  ALT  --  50*  ALKPHOS  --  119  BILITOT  --  0.4   Lipids No results for input(s): "CHOL", "TRIG", "HDL", "LABVLDL", "LDLCALC", "CHOLHDL" in the last 168 hours.  Hematology Recent Labs  Lab 04/21/22 0529 04/21/22 1221 04/22/22 0805 04/23/22 0436  WBC 8.5  --  9.3 7.4  RBC 3.23*  --  3.33* 3.23*  HGB 7.3* 8.5* 7.9* 7.6*  HCT 24.5* 28.0* 24.9* 24.6*  MCV 75.9*  --  74.8* 76.2*  MCH 22.6*  --  23.7* 23.5*  MCHC 29.8*  --  31.7 30.9  RDW 14.6  --  14.9 15.4  PLT 278  --  297 217   Thyroid No results for input(s): "TSH", "FREET4" in the last 168 hours.  BNPNo results for input(s): "BNP", "PROBNP" in the last 168 hours.  DDimer No results for input(s): "DDIMER" in the last 168 hours.   Radiology/Studies:  CT Angio Chest Pulmonary Embolism (PE) W or WO Contrast  Addendum Date: 04/22/2022   ADDENDUM REPORT: 04/22/2022 15:42 ADDENDUM: Imaging finding of small thrombus burden PE in right lower lobe and right heart dysfunction were relayed to Dr. PoFlorene Gleny telephone call. Electronically Signed   By: PaPrudy Feeler.  On: 04/22/2022 15:42   Result Date: 04/22/2022 CLINICAL DATA:  Cardiac arrest, abnormal echocardiogram EXAM: CT ANGIOGRAPHY CHEST WITH CONTRAST TECHNIQUE: Multidetector CT imaging of the chest was performed using the standard protocol during bolus administration of intravenous contrast. Multiplanar CT image reconstructions and MIPs were obtained to evaluate the vascular anatomy. RADIATION DOSE REDUCTION: This exam was performed according to the departmental dose-optimization program which includes automated exposure control, adjustment  of the mA and/or kV according to patient size and/or use of iterative reconstruction technique. CONTRAST:  21m OMNIPAQUE IOHEXOL 350 MG/ML SOLN COMPARISON:  Noncontrast CT chest done on 04/16/2022 and chest radiographs done on 04/19/2022 FINDINGS: Cardiovascular: There are no intraluminal filling defects in central pulmonary artery branches. In image 565of series 5, filling defect is seen in is subsegmental pulmonary artery branch in the posterior right lower lung field. Motion artifacts limit evaluation of small peripheral pulmonary artery branches. Coronary artery calcifications are seen. There is homogeneous enhancement in thoracic aorta. There are scattered calcifications in thoracic aorta and its major branches. Small pericardial effusion is present. RV LV ratio is 1.3. Mediastinum/Nodes: There are enlarged lymph nodes in both hilar regions and mediastinum with no interval change. Lungs/Pleura: There is interval removal of endotracheal tube and enteric tube. There is interval improvement in aeration in both mid and lower lung fields. There is interval worsening of infiltrates in right upper lobe. There is linear focus of dense infiltrate in the medial left upper lobe, possibly chronic atelectasis. Residual patchy infiltrates are noted in right lower lobe. Small to moderate bilateral pleural effusions are seen, more so on the left side. There is interval decrease in amount of bilateral pleural effusions. There is no pneumothorax. Upper Abdomen: Surgical clips are seen in gallbladder fossa. There is 5 mm calcific density in the upper pole of right kidney. Musculoskeletal: No acute findings are seen. Review of the MIP images confirms the above findings. IMPRESSION: There is intraluminal filling defect in small subsegmental branch in right lower lobe suggesting pulmonary embolism with very small thrombus burden. No other definite intraluminal filling defects are seen. RV LV ratio is 1.3 which may suggest chronic  right heart dysfunction. Small pericardial effusion is present. Patchy infiltrates are seen in both lungs with areas of improvement and areas of worsening since 04/16/2022. Findings suggest multifocal pneumonia. There is dense atelectasis in the medial left upper lobe which has not changed significantly. There is interval decrease in amount of bilateral pleural effusions. Coronary artery disease.  Right renal calculus. Other findings as described in the body of the report. Electronically Signed: By: PElmer PickerM.D. On: 04/22/2022 15:32     Assessment and Plan:   1. Cardiac arrest: Out of hospital PEA arrest, then in-hospital VT/VF arrest in setting of suspected massive PE with right heart strain. Her LV function is normal. Her presentation is likely related to the PE. I do not think a workup for coronary ischemia is indicated currently.   2. Elevated troponin, type 2 MI: Suspect due to demand ischemia due to PE and with hypotension during cardiac arrest. No ischemic evaluation is planned at this time.   3. Small pericardial effusion: repeat limited echo now. Last echo 14 days ago.   4. Prolonged QT interval on admission: improved by f/u EKG  5. Essential HTN: BP is controlled.   Plan reviewed with Dr. PFlorene Glen the patient and her daughter.   We will arrange follow up in our office in several weeks.   For questions or  updates, please contact [place] Please consult [email or web address] for contact info under   Lauree Chandler, MD, [facility] 04/23/2022 12:16 PM

## 2022-04-23 NOTE — Discharge Instructions (Signed)
Information on my medicine - ELIQUIS (apixaban)  This medication education was reviewed with me or my healthcare representative as part of my discharge preparation.  The pharmacist that spoke with me during my hospital stay was:  Einar Grad, [facility]  Why was Eliquis prescribed for you? Eliquis was prescribed to treat blood clots that may have been found in the veins of your legs (deep vein thrombosis) or in your lungs (pulmonary embolism) and to reduce the risk of them occurring again.  What do You need to know about Eliquis ? The dose is ONE 5 mg tablet taken TWICE daily.  Eliquis may be taken with or without food.   Try to take the dose about the same time in the morning and in the evening. If you have difficulty swallowing the tablet whole please discuss with your pharmacist how to take the medication safely.  Take Eliquis exactly as prescribed and DO NOT stop taking Eliquis without talking to the doctor who prescribed the medication.  Stopping may increase your risk of developing a new blood clot.  Refill your prescription before you run out.  After discharge, you should have regular check-up appointments with your healthcare provider that is prescribing your Eliquis.    What do you do if you miss a dose? If a dose of ELIQUIS is not taken at the scheduled time, take it as soon as possible on the same day and twice-daily administration should be resumed. The dose should not be doubled to make up for a missed dose.  Important Safety Information A possible side effect of Eliquis is bleeding. You should call your healthcare provider right away if you experience any of the following: Bleeding from an injury or your nose that does not stop. Unusual colored urine (red or dark brown) or unusual colored stools (red or black). Unusual bruising for unknown reasons. A serious fall or if you hit your head (even if there is no bleeding).  Some medicines may interact with Eliquis and  might increase your risk of bleeding or clotting while on Eliquis. To help avoid this, consult your healthcare provider or pharmacist prior to using any new prescription or non-prescription medications, including herbals, vitamins, non-steroidal anti-inflammatory drugs (NSAIDs) and supplements.  This website has more information on Eliquis (apixaban): http://www.eliquis.com/eliquis/home

## 2022-04-24 ENCOUNTER — Inpatient Hospital Stay (HOSPITAL_COMMUNITY): Payer: Medicare PPO

## 2022-04-24 DIAGNOSIS — I469 Cardiac arrest, cause unspecified: Secondary | ICD-10-CM | POA: Diagnosis not present

## 2022-04-24 LAB — GLUCOSE, CAPILLARY
Glucose-Capillary: 211 mg/dL — ABNORMAL HIGH (ref 70–99)
Glucose-Capillary: 150 mg/dL — ABNORMAL HIGH (ref 70–99)
Glucose-Capillary: 147 mg/dL — ABNORMAL HIGH (ref 70–99)

## 2022-04-24 LAB — LIPID PANEL
Cholesterol: 212 mg/dL — ABNORMAL HIGH (ref 0–200)
HDL: 54 mg/dL (ref >40)
LDL Cholesterol: 129 mg/dL — ABNORMAL HIGH (ref 0–99)
Total CHOL/HDL Ratio: 3.9 RATIO
Triglycerides: 143 mg/dL (ref <150)
VLDL: 29 mg/dL (ref 0–40)

## 2022-04-24 LAB — BASIC METABOLIC PANEL
Anion gap: 13 (ref 5–15)
BUN: 12 mg/dL (ref 8–23)
CO2: 30 mmol/L (ref 22–32)
Calcium: 8.9 mg/dL (ref 8.9–10.3)
Chloride: 98 mmol/L (ref 98–111)
Creatinine, Ser: 1.06 mg/dL — ABNORMAL HIGH (ref 0.44–1.00)
GFR, Estimated: 57 mL/min — ABNORMAL LOW (ref >60)
Glucose, Bld: 233 mg/dL — ABNORMAL HIGH (ref 70–99)
Potassium: 3.1 mmol/L — ABNORMAL LOW (ref 3.5–5.1)
Sodium: 141 mmol/L (ref 135–145)

## 2022-04-24 LAB — CBC
HCT: 28.2 % — ABNORMAL LOW (ref 36.0–46.0)
Hemoglobin: 8.5 g/dL — ABNORMAL LOW (ref 12.0–15.0)
MCH: 22.9 pg — ABNORMAL LOW (ref 26.0–34.0)
MCHC: 30.1 g/dL (ref 30.0–36.0)
MCV: 76 fL — ABNORMAL LOW (ref 80.0–100.0)
Platelets: 310 10*3/uL (ref 150–400)
RBC: 3.71 MIL/uL — ABNORMAL LOW (ref 3.87–5.11)
RDW: 15.9 % — ABNORMAL HIGH (ref 11.5–15.5)
WBC: 7.6 10*3/uL (ref 4.0–10.5)
nRBC: 0.3 % — ABNORMAL HIGH (ref 0.0–0.2)

## 2022-04-24 LAB — VITAMIN B12: Vitamin B-12: 1585 pg/mL — ABNORMAL HIGH (ref 180–914)

## 2022-04-24 LAB — RESP PANEL BY RT-PCR (RSV, FLU A&B, COVID)  RVPGX2
Influenza A by PCR: NEGATIVE
Influenza B by PCR: NEGATIVE
Resp Syncytial Virus by PCR: NEGATIVE
SARS Coronavirus 2 by RT PCR: NEGATIVE

## 2022-04-24 LAB — T4, FREE: Free T4: 0.81 ng/dL (ref 0.61–1.12)

## 2022-04-24 LAB — MAGNESIUM: Magnesium: 1.4 mg/dL — ABNORMAL LOW (ref 1.7–2.4)

## 2022-04-24 LAB — TSH: TSH: 9.861 u[IU]/mL — ABNORMAL HIGH (ref 0.350–4.500)

## 2022-04-24 MED ORDER — MAGNESIUM SULFATE 4 GM/100ML IV SOLN
4.0000 g | Freq: Once | INTRAVENOUS | Status: AC
  Administered 2022-04-24: 4 g via INTRAVENOUS
  Filled 2022-04-24: qty 100

## 2022-04-24 MED ORDER — LOSARTAN POTASSIUM 25 MG PO TABS
25.0000 mg | ORAL_TABLET | Freq: Every day | ORAL | Status: DC
  Administered 2022-04-24 – 2022-04-25 (×2): 25 mg via ORAL
  Filled 2022-04-24 (×2): qty 1

## 2022-04-24 MED ORDER — POTASSIUM CHLORIDE CRYS ER 20 MEQ PO TBCR
40.0000 meq | EXTENDED_RELEASE_TABLET | ORAL | Status: AC
  Administered 2022-04-24 – 2022-04-25 (×2): 40 meq via ORAL
  Filled 2022-04-24 (×2): qty 2

## 2022-04-24 MED ORDER — ROSUVASTATIN CALCIUM 20 MG PO TABS
20.0000 mg | ORAL_TABLET | Freq: Every day | ORAL | Status: DC
  Administered 2022-04-24 – 2022-04-25 (×2): 20 mg via ORAL
  Filled 2022-04-24 (×2): qty 1

## 2022-04-24 NOTE — Progress Notes (Signed)
Mobility Specialist Progress Note:   04/24/22 1000  Mobility  Activity Ambulated with assistance in hallway  Level of Assistance Contact guard assist, steadying assist  Assistive Device Front wheel walker  Distance Ambulated (ft) 100 ft  Activity Response Tolerated well  $Mobility charge 1 Mobility   Pt in bed willing to participate in mobility. No complaints of pain. Left in bed with call bell in reach and all needs met.   [place] [facility] Mobility Specialist Please contact via Secure Chat or  Rehab Office at [phone]  

## 2022-04-24 NOTE — Progress Notes (Signed)
PROGRESS NOTE    Deborah Neal  WPY:[id number] DOB: 03-27-1951 DOA: 04/09/2022 PCP: Biagio Borg, MD  Chief Complaint  Patient presents with   CPR    Brief Narrative:  71 year old female with past medical history as below, which is significant for hypertension, hyperlipidemia, type 2 diabetes mellitus, and asthma. She was in her usual state of health until approximately 5:30 AM on 12/15 when she was seen by granddaughter. Family attempted to contact her via phone around 7 AM and she did not answer. When they later went to check on her around 10 AM she was unresponsive in her bed with red emesis seen around her. Upon arrival to the fire department the patient was unresponsive but did have pulse and respirations. Then suffered seizure. Upon EMS arrival patient lost pulses and CPR was initiated. CPR less than 5 minutes. Patient demonstrated seizure-like activity en route to [facility] emergency department. Improved with benzodiazepine administration. Upon arrival to emergency department the patient was intubated. She remained unresponsive at the time PCCM was asked to admit.   Significant Events 12/15 admit to [place] s/p cardiac arrest 12/17 MRI - neg acute, stable moderate atrophy and white matter disease. This likely reflects the sequela of chronic microvascular ischemia. 12/20-weaning well, no cuff leak. Steroids started.  12/20 not tolerating SBT, rapid rate.  12/22 Heparin stopped 2/2 hematuria 12/23>> Extubated 12/24 >> Heparin restarted , at Shirelle Tootle lower therapeutic range        12/25 HGB from from 8.8 to 7.3 after heparin resumed 12/25   Assessment & Plan:   Principal Problem:   Cardiac arrest [facility]) Active Problems:   Acute respiratory failure with hypoxia (HCC)   Lactic acidosis   Seizure ([place])   Type 2 diabetes mellitus without complication, with long-term current use of insulin ([place])   Shock ([place])   Encephalopathy acute  Out of Hospital PEA Cardiac Arrest In hospital  Vfib/vtach arrest in setting of severe hypokalemia Presumed Massive Pulmonary Embolism s/p TNKase Completed normothermia protocol s/p TNKase Transitioned to eliquis  LE [place] negative for DVT Echo with findings concerning for clot in transit, RV function mildly reduced with free wall hypokinesis, EF 65-70% no RWMA, grade 1 diastolic dysfunction CT PE protocol with PE with very small thrombus burden (this was on 12/28)  Acute Hypoxic Respiratory Failure  Multifocal Pneumonia  Aspiration Pneumonia  Bilateral Pleural Effusions Serratia Marcescens Pneumonia Continue cefepime 12/23 - 29. Has lasix ordered, will continue as tolerated Planning for repeat CT scan (with patchy infiltrates in both lungs, decrease in amount of bilateral effusions), depending on response to lasix/may need thoracentesis for effusions CXR 12/30 with worsening diffuse R lung airspace opacity - no leukocytosis, no fevers Completed abx, will continue to monitor off abx Follow   Pericardial Effusion Follow limited echo (12/29 without effusion)  Acute Kidney Injury Creatinine peaked at 5.64 Baseline appears to be <1 (06/2021) Improving, follow with lasix   Diffuse Body Wall Edema Noted on CT 12/19  Continue lasix as tolerated Follow albumin 2.6, UA without protein  Seizure Like Activity EEG with findings suggestive of severe to profound diffuse encephalopathy, no seizures or epileptiform discharges Keppra has been d/c'd MRI brain without acute intracranial abnormality or significant interval change  Demand Ischemia  In the setting of presumed massive PE, s/p CPR after PEA arrest  Will need cardiology follow up as an outpatient  LDL 129  Anemia Fluctuating, relatively stable Follow iron, b12, folate, ferritin (suggestive of chronic dz) CT scan  12/19 negative for retroperitoneal hematoma  Dense Atelectasis in LUL with decreased volume in L Lung Concern for chronic obstruction of LUL bronchus - needs short  term follow up CT in 3 months vs endoscopy or PET CT  Hypertension Lasix Resume losartan, lower dose, uptitrate to home as tolerated Amlodipine was started 12/26  Elevated TSH Follow free T3, T4  Hypernatremia Improved  Hypokalemia  Hypomagnesemia Replace and follow  DKA Continue basal bolus regimen (on Ona Rathert very high dose regimen at home, she's receiving Kutler Vanvranken fraction of this currently) Adjust as needed Holding jardiance for now with recent AKI  HLD Resume crestor LDL 129    DVT prophylaxis: heparin gtt -> eliquis Code Status: full Family Communication: daughter at bedside Disposition:   Status is: Inpatient Remains inpatient appropriate because: ongoing workup, need for abx, anticoagulation   Consultants:  PCCM renal  Procedures:  LE [place] Summary:  RIGHT:  - There is no evidence of deep vein thrombosis in the lower extremity.    - Daina Cara cystic structure is found in the popliteal fossa measuring 5.0 x 1.1 x  2.9 cm.    LEFT:  - There is no evidence of deep vein thrombosis in the lower extremity.    - No cystic structure found in the popliteal fossa.     *See table(s) above for measurements and observations.   Echo IMPRESSIONS     1. There appears to be Chidiebere Wynn mobile echodensity in the RV outflow tract  (images 45-51), not well visualized but given presentation with cardiac  arrest, concerning for thrombus and recommend PE evaluation. RV function  appears mildly reduced with free wall  hypokinesis   2. Left ventricular ejection fraction, by estimation, is 65 to 70%. The  left ventricle has normal function. The left ventricle has no regional  wall motion abnormalities. There is mild left ventricular hypertrophy.  Left ventricular diastolic parameters  are consistent with Grade I diastolic dysfunction (impaired relaxation).   3. Right ventricular systolic function is mildly reduced. The right  ventricular size is normal. There is mildly elevated pulmonary artery   systolic pressure.   4. The mitral valve is normal in structure. Trivial mitral valve  regurgitation.   5. The aortic valve is tricuspid. Aortic valve regurgitation is not  visualized. No aortic stenosis is present.   6. The inferior vena cava is normal in size with greater than 50%  respiratory variability, suggesting right atrial pressure of 3 mmHg.   Antimicrobials:  Anti-infectives (From admission, onward)    Start     Dose/Rate Route Frequency Ordered Stop   04/20/22 2200  ceFEPIme (MAXIPIME) 2 g in sodium chloride 0.9 % 100 mL IVPB        2 g 200 mL/hr over 30 Minutes Intravenous Every 12 hours 04/20/22 1107 04/24/22 0042   04/17/22 1400  ceFEPIme (MAXIPIME) 2 g in sodium chloride 0.9 % 100 mL IVPB  Status:  Discontinued        2 g 200 mL/hr over 30 Minutes Intravenous Every 24 hours 04/17/22 1311 04/20/22 1107   04/10/22 1100  Ampicillin-Sulbactam (UNASYN) 3 g in sodium chloride 0.9 % 100 mL IVPB        3 g 200 mL/hr over 30 Minutes Intravenous Every 12 hours 04/10/22 0953 04/16/22 2137   04/09/22 1430  cefTRIAXone (ROCEPHIN) 2 g in sodium chloride 0.9 % 100 mL IVPB  Status:  Discontinued        2 g 200 mL/hr over 30 Minutes Intravenous  Every 24 hours 04/09/22 1333 04/10/22 0945       Subjective: C/o chills, nasal congestion   Objective: Vitals:   04/24/22 0500 04/24/22 0757 04/24/22 0819 04/24/22 1535  BP:  (!) 159/64 124/66 (!) 140/66  Pulse:  75 86 (!) 102  Resp:  '18 16 20  '$ Temp:  99.1 F (37.3 C) 98.5 F (36.9 C) 98 F (36.7 C)  TempSrc:  Oral Oral Axillary  SpO2:  97% 90% 100%  Weight: 74.3 kg     Height:        Intake/Output Summary (Last 24 hours) at 04/24/2022 1804 Last data filed at 04/24/2022 0414 Gross per 24 hour  Intake 400 ml  Output 500 ml  Net -100 ml   Filed Weights   04/22/22 0426 04/23/22 0630 04/24/22 0500  Weight: 77.6 kg 77.7 kg 74.3 kg    Examination:  General: No acute distress. Cardiovascular: RRR Lungs: unlabored,  diminished - sniffling freq during my eval Abdomen: Soft, nontender, nondistended  Neurological: Alert and oriented 3. Moves all extremities 4 with equal strength. Cranial nerves II through XII grossly intact. Extremities: No clubbing or cyanosis. No edema.  Data Reviewed: I have personally reviewed following labs and imaging studies  CBC: Recent Labs  Lab 04/19/22 0510 04/19/22 1424 04/20/22 0245 04/20/22 1222 04/21/22 0529 04/21/22 1221 04/22/22 0805 04/23/22 0436 04/24/22 0937  WBC 9.4 14.1* 10.7* 10.8* 8.5  --  9.3 7.4 7.6  NEUTROABS 7.4 11.6* 8.4*  --   --   --   --  5.3  --   HGB 7.3* 9.3* 7.6* 8.0* 7.3* 8.5* 7.9* 7.6* 8.5*  HCT 23.3* 29.0* 24.1* 25.7* 24.5* 28.0* 24.9* 24.6* 28.2*  MCV 74.7* 74.0* 74.4* 73.9* 75.9*  --  74.8* 76.2* 76.0*  PLT 220 286 264 279 278  --  297 217 024    Basic Metabolic Panel: Recent Labs  Lab 04/18/22 0233 04/18/22 1257 04/20/22 0245 04/21/22 0529 04/22/22 0805 04/23/22 0436 04/24/22 0937  NA 148*   < > 144 141 142 140 141  K 3.1*   < > 3.3* 3.1* 3.5 3.8 3.1*  CL 113*   < > 111 103 104 102 98  CO2 23   < > '25 29 29 28 30  '$ GLUCOSE 129*   < > 148* 297* 352* 333* 233*  BUN 56*   < > 32* '23 23 19 12  '$ CREATININE 2.47*   < > 1.68* 1.43* 1.20* 1.08* 1.06*  CALCIUM 8.9   < > 8.5* 8.5* 8.5* 8.6* 8.9  MG 1.5*   < > 1.9 1.8 1.8 1.7 1.4*  PHOS 4.7*  --   --   --   --  2.2*  --    < > = values in this interval not displayed.    GFR: Estimated Creatinine Clearance: 47.7 mL/min ([place]) (by C-G formula based on SCr of 1.06 mg/dL (H)).  Liver Function Tests: Recent Labs  Lab 04/21/22 1222 04/23/22 0436  AST  --  29  ALT  --  50*  ALKPHOS  --  119  BILITOT  --  0.4  PROT  --  5.9*  ALBUMIN 2.6* 2.3*    CBG: Recent Labs  Lab 04/23/22 1626 04/23/22 2154 04/24/22 0752 04/24/22 1146 04/24/22 1632  GLUCAP 158* 168* 211* 150* 147*     Recent Results (from the past 240 hour(s))  Culture, Respiratory w Gram Stain     Status: None    Collection Time: 04/16/22  9:10  AM   Specimen: Tracheal Aspirate; Respiratory  Result Value Ref Range Status   Specimen Description TRACHEAL ASPIRATE  Final   Special Requests NONE  Final   Gram Stain FEW WBC PRESENT, PREDOMINANTLY PMN RARE YEAST   Final   Culture   Final    FEW SERRATIA MARCESCENS FEW CANDIDA ALBICANS No Pseudomonas species isolated NO STAPHYLOCOCCUS AUREUS ISOLATED Performed at [facility], [address], [place],  [address]    Report Status 04/18/2022 FINAL  Final   Organism ID, Bacteria SERRATIA MARCESCENS  Final      Susceptibility   Serratia marcescens - MIC*    CEFAZOLIN >=64 RESISTANT Resistant     CEFEPIME <=0.12 SENSITIVE Sensitive     CEFTAZIDIME <=1 SENSITIVE Sensitive     CEFTRIAXONE <=0.25 SENSITIVE Sensitive     CIPROFLOXACIN <=0.25 SENSITIVE Sensitive     GENTAMICIN <=1 SENSITIVE Sensitive     TRIMETH/SULFA <=20 SENSITIVE Sensitive     * FEW SERRATIA MARCESCENS  Resp panel by RT-PCR (RSV, Flu Carys Malina&B, Covid) Anterior Nasal Swab     Status: None   Collection Time: 04/24/22 12:40 PM   Specimen: Anterior Nasal Swab  Result Value Ref Range Status   SARS Coronavirus 2 by RT PCR NEGATIVE NEGATIVE Final    Comment: (NOTE) SARS-CoV-2 target nucleic acids are NOT DETECTED.  The SARS-CoV-2 RNA is generally detectable in upper respiratory specimens during the acute phase of infection. The lowest concentration of SARS-CoV-2 viral copies this assay can detect is 138 copies/mL. Johna Kearl negative result does not preclude SARS-Cov-2 infection and should not be used as the sole basis for treatment or other patient management decisions. Halle Davlin negative result may occur with  improper specimen collection/handling, submission of specimen other than nasopharyngeal swab, presence of viral mutation(s) within the areas targeted by this assay, and inadequate number of viral copies(<138 copies/mL). Konstantina Nachreiner negative result must be combined with clinical observations,  patient history, and epidemiological information. The expected result is Negative.  Fact Sheet for Patients:  EntrepreneurPulse.com.au  Fact Sheet for Healthcare Providers:  IncredibleEmployment.be  This test is no t yet approved or cleared by the [place] FDA and  has been authorized for detection and/or diagnosis of SARS-CoV-2 by FDA under an Emergency Use Authorization (EUA). This EUA will remain  in effect (meaning this test can be used) for the duration of the COVID-19 declaration under Section 564(b)(1) of the Act, 21 U.S.C.section 360bbb-3(b)(1), unless the authorization is terminated  or revoked sooner.       Influenza Jaidin Ugarte by PCR NEGATIVE NEGATIVE Final   Influenza B by PCR NEGATIVE NEGATIVE Final    Comment: (NOTE) The Xpert Xpress SARS-CoV-2/FLU/RSV plus assay is intended as an aid in the diagnosis of influenza from Nasopharyngeal swab specimens and should not be used as Shailyn Weyandt sole basis for treatment. Nasal washings and aspirates are unacceptable for Xpert Xpress SARS-CoV-2/FLU/RSV testing.  Fact Sheet for Patients: EntrepreneurPulse.com.au  Fact Sheet for Healthcare Providers: IncredibleEmployment.be  This test is not yet approved or cleared by the [place] FDA and has been authorized for detection and/or diagnosis of SARS-CoV-2 by FDA under an Emergency Use Authorization (EUA). This EUA will remain in effect (meaning this test can be used) for the duration of the COVID-19 declaration under Section 564(b)(1) of the Act, 21 U.S.C. section 360bbb-3(b)(1), unless the authorization is terminated or revoked.     Resp Syncytial Virus by PCR NEGATIVE NEGATIVE Final    Comment: (NOTE) Fact Sheet for Patients: EntrepreneurPulse.com.au  Fact Sheet for Healthcare Providers: IncredibleEmployment.be  This test is not yet approved or cleared by the [place] FDA and has been authorized for detection and/or diagnosis of SARS-CoV-2 by FDA under an Emergency Use Authorization (EUA). This EUA will remain in effect (meaning this test can be used) for the duration of the COVID-19 declaration under Section 564(b)(1) of the Act, 21 U.S.C. section 360bbb-3(b)(1), unless the authorization is terminated or revoked.  Performed at [facility], [place] [address] [place], [place] [address]          Radiology Studies: DG CHEST PORT 1 VIEW  Result Date: 04/24/2022 CLINICAL DATA:  Pneumonia.  Pleural effusion. EXAM: PORTABLE CHEST 1 VIEW COMPARISON:  04/19/2022 FINDINGS: Stable mild cardiomegaly. Decreased opacity is seen in the central left upper andaa lower lobes. Worsening diffuse airspace opacity is seen throughout the right lung. Evidence of pneumothorax or pleural effusion. IMPRESSION: Worsening diffuse right lung airspace opacity. Decreased opacity in central left upper and lower lobes. Electronically Signed   By: Marlaine Hind M.D.   On: 04/24/2022 09:10   ECHOCARDIOGRAM LIMITED  Result Date: 04/23/2022    ECHOCARDIOGRAM LIMITED REPORT   Patient Name:   Deborah Neal Date of Exam: 04/23/2022 Medical Rec #:  [id number]    Height:       63.0 in Accession #:    [id number]   Weight:       171.3 lb Date of Birth:  1950/09/17    BSA:          1.810 m Patient Age:    45 years     BP:           133/69 mmHg Patient Gender: F            HR:           83 bpm. Exam Location:  Inpatient Procedure: Limited Echo, Cardiac Doppler and Color Doppler Indications:    pericardial effusion  History:        Patient has prior history of Echocardiogram examinations, most                 recent 04/09/2022. Arrythmias:Cardiac Arrest; Risk                 Factors:Diabetes and Hypertension.  Sonographer:    Johny Chess RDCS Referring Phys: [place]  1. Left ventricular ejection fraction, by estimation, is 60 to 65%. The left ventricle  has normal function. The left ventricle has no regional wall motion abnormalities. Left ventricular diastolic parameters were normal.  2. Right ventricular systolic function is normal. The right ventricular size is normal. There is moderately elevated pulmonary artery systolic pressure.  3. The mitral valve is normal in structure. No evidence of mitral valve regurgitation. No evidence of mitral stenosis.  4. The aortic valve is normal in structure. Aortic valve regurgitation is not visualized. No aortic stenosis is present.  5. The inferior vena cava is normal in size with greater than 50% respiratory variability, suggesting right atrial pressure of 3 mmHg. FINDINGS  Left Ventricle: Left ventricular ejection fraction, by estimation, is 60 to 65%. The left ventricle has normal function. The left ventricle has no regional wall motion abnormalities. The left ventricular internal cavity size was normal in size. There is  no left ventricular hypertrophy. Left ventricular diastolic parameters were normal. Indeterminate filling pressures. Right Ventricle: The right ventricular size is normal. No increase in right ventricular wall thickness. Right ventricular  systolic function is normal. There is moderately elevated pulmonary artery systolic pressure. The tricuspid regurgitant velocity is 3.34 m/s, and with an assumed right atrial pressure of 3 mmHg, the estimated right ventricular systolic pressure is 78.6 mmHg. Left Atrium: Left atrial size was normal in size. Right Atrium: Right atrial size was normal in size. Pericardium: There is no evidence of pericardial effusion. Mitral Valve: The mitral valve is normal in structure. No evidence of mitral valve stenosis. Tricuspid Valve: The tricuspid valve is normal in structure. Tricuspid valve regurgitation is trivial. No evidence of tricuspid stenosis. Aortic Valve: The aortic valve is normal in structure. Aortic valve regurgitation is not visualized. No aortic stenosis is  present. Pulmonic Valve: The pulmonic valve was normal in structure. Pulmonic valve regurgitation is not visualized. No evidence of pulmonic stenosis. Aorta: The aortic root is normal in size and structure. Venous: The inferior vena cava is normal in size with greater than 50% respiratory variability, suggesting right atrial pressure of 3 mmHg. IAS/Shunts: No atrial level shunt detected by color flow Doppler. Additional Comments: Spectral Doppler performed. Color Doppler performed.   Diastology LV e' medial:    9.32 cm/s LV E/e' medial:  11.7 LV e' lateral:   12.90 cm/s LV E/e' lateral: 8.4  IVC IVC diam: 1.50 cm AORTIC VALVE LVOT Vmax:   138.00 cm/s LVOT Vmean:  90.700 cm/s LVOT VTI:    0.244 m MITRAL VALVE                TRICUSPID VALVE MV Area (PHT): 4.80 cm     TR Peak grad:   44.6 mmHg MV Decel Time: 158 msec     TR Vmax:        334.00 cm/s MV E velocity: 109.00 cm/s MV Arrin Ishler velocity: 93.80 cm/s   SHUNTS MV E/Kem Hensen ratio:  1.16         Systemic VTI: 0.24 m Skeet Latch MD Electronically signed by Skeet Latch MD Signature Date/Time: 04/23/2022/7:18:12 PM    Final         Scheduled Meds:  amLODipine  7.5 mg Oral Daily   apixaban  5 mg Oral BID   Chlorhexidine Gluconate Cloth  6 each Topical Daily   feeding supplement  237 mL Oral TID WC   furosemide  40 mg Intravenous Daily   insulin aspart  0-15 Units Subcutaneous TID WC   insulin aspart  8 Units Subcutaneous TID WC   insulin glargine-yfgn  18 Units Subcutaneous BID   magnesium oxide  400 mg Oral Daily   multivitamin with minerals  1 tablet Oral Daily   pantoprazole (PROTONIX) IV  40 mg Intravenous QHS   Continuous Infusions:  sodium chloride Stopped (04/17/22 1020)     LOS: 15 days    Time spent: over 30 min    Fayrene Helper, MD [facility]   To contact the attending provider between 7A-7P or the covering provider during after hours 7P-7A, please log into the web site [email or web address] and access using universal [facility] password for that web site. If you do not have the password, please call the hospital operator.  04/24/2022, 6:04 PM

## 2022-04-24 NOTE — Evaluation (Signed)
Clinical/Bedside Swallow Evaluation Patient Details  Name: Deborah Neal MRN: [id number] Date of Birth: 10-30-1950  Today's Date: 04/24/2022 Time: SLP Start Time (ACUTE ONLY): 9476 SLP Stop Time (ACUTE ONLY): 1503 SLP Time Calculation (min) (ACUTE ONLY): 8 min  Past Medical History:  Past Medical History:  Diagnosis Date   ALLERGIC RHINITIS 01/29/2007   Arthritis    ASTHMA 01/29/2007   ASTHMA, WITH ACUTE EXACERBATION 06/24/2008   BACK PAIN 01/29/2009   CHEST PAIN 06/24/2008   Family history of adverse reaction to anesthesia    Patients daughter has N/V after anesthesia   GERD (gastroesophageal reflux disease)    History of shingles    HYPERLIPIDEMIA 01/29/2007   HYPERTENSION 01/29/2007   OSTEOPENIA 10/16/2007   Type II or unspecified type diabetes mellitus without mention of complication, uncontrolled 11/29/2013   Past Surgical History:  Past Surgical History:  Procedure Laterality Date   CHOLECYSTECTOMY N/A 09/19/2015   Procedure: LAPAROSCOPIC CHOLECYSTECTOMY WITH INTRAOPERATIVE CHOLANGIOGRAM;  Surgeon: Jackolyn Confer, MD;  Location: [facility];  Service: General;  Laterality: N/A;   COLONOSCOPY     TONSILLECTOMY  1956   VIDEO BRONCHOSCOPY Bilateral 03/28/2014   Procedure: VIDEO BRONCHOSCOPY WITHOUT FLUORO;  Surgeon: Tanda Rockers, MD;  Location: [facility];  Service: Cardiopulmonary;  Laterality: Bilateral;   HPI:  [name] is a 71 year old female who was admitted 12/15 s/p cardiac arrest CPR <5 minutes in field.  MRI 12/17 with no acute findings. Pt with ETT 12/15-23. Passed [facility] swallow screen post extubation. CXR 12/30: "Worsening diffuse right lung airspace opacity.  Decreased opacity in central left upper and lower lobes." Pt with with past medical history significant for hypertension, hyperlipidemia, type 2 diabetes mellitus, and asthma    Assessment / Plan / Recommendation  Clinical Impression  Pt presents with functional swallowing as assessed clinically.  Pt tolerated all  consistencies trialed with no clinical s/s of aspiration, including large, serial straw sips of thin liquid.  Pt is edentulous and typically wears dentures but they are not present in facility.  She and daughter deny difficulty with POs without dentures in place.  Pt with coughing incident with outside food.  Pt denies that this occurs with any frequency.  She denies food/drink going the wrong way, but endorses feeling of stasis after swallow at times.  Pt appears safe to continue regular texture diet.  Spoke with MD via secure chat.  No specific concern for silent aspiration.  Pt has no further ST needs; SLP will sign off.   Recommend continuing regular texture diet with thin liquids.   SLP Visit Diagnosis: Dysphagia, unspecified (R13.10)    Aspiration Risk  No limitations    Diet Recommendation Regular;Thin liquid   Liquid Administration via: Cup;Straw Medication Administration: Whole meds with liquid (as tolerated, no specific precautions) Supervision: Patient able to self feed Compensations: Slow rate;Small sips/bites Postural Changes: Seated upright at 90 degrees    Other  Recommendations Oral Care Recommendations: Oral care BID    Recommendations for follow up therapy are one component of a multi-disciplinary discharge planning process, led by the attending physician.  Recommendations may be updated based on patient status, additional functional criteria and insurance authorization.  Follow up Recommendations No SLP follow up      Assistance Recommended at Discharge    Functional Status Assessment Patient has not had a recent decline in their functional status  Frequency and Duration  (N/A)          Prognosis Prognosis for Safe Diet Advancement:  (  N/A)      Swallow Study   General HPI: [name] is a 71 year old female who was admitted 12/15 s/p cardiac arrest CPR <5 minutes in field.  MRI 12/17 with no acute findings. Pt with ETT 12/15-23. Passed [facility] swallow screen post  extubation. CXR 12/30: "Worsening diffuse right lung airspace opacity.  Decreased opacity in central left upper and lower lobes." Pt with with past medical history significant for hypertension, hyperlipidemia, type 2 diabetes mellitus, and asthma Type of Study: Bedside Swallow Evaluation Previous Swallow Assessment: none Diet Prior to this Study: Regular Temperature Spikes Noted: No History of Recent Intubation: Yes Length of Intubations (days): 8 days Date extubated: 04/17/22 Behavior/Cognition: Alert;Cooperative;Pleasant mood Oral Cavity Assessment: Within Functional Limits Oral Care Completed by SLP: No Oral Cavity - Dentition: Edentulous;Dentures, not available Self-Feeding Abilities: Able to feed self Patient Positioning: Upright in bed Baseline Vocal Quality: Normal Volitional Cough:  (Fair) Volitional Swallow: Able to elicit    Oral/Motor/Sensory Function Overall Oral Motor/Sensory Function: Within functional limits Facial ROM: Within Functional Limits Facial Symmetry: Within Functional Limits Lingual ROM: Within Functional Limits Lingual Symmetry: Within Functional Limits Lingual Strength: Within Functional Limits Velum: Within Functional Limits Mandible: Within Functional Limits   Ice Chips Ice chips: Not tested   Thin Liquid Thin Liquid: Within functional limits Presentation: Straw    Nectar Thick Nectar Thick Liquid: Not tested   Honey Thick Honey Thick Liquid: Not tested   Puree Puree: Within functional limits Presentation: Spoon   Solid     Solid: Within functional limits Presentation: [place], [place], [place] Office: [phone] 04/24/2022,3:17 PM

## 2022-04-25 ENCOUNTER — Encounter (HOSPITAL_COMMUNITY): Payer: Self-pay | Admitting: Physical Medicine and Rehabilitation

## 2022-04-25 ENCOUNTER — Inpatient Hospital Stay (HOSPITAL_COMMUNITY): Payer: Medicare PPO

## 2022-04-25 ENCOUNTER — Inpatient Hospital Stay (HOSPITAL_COMMUNITY)
Admission: RE | Admit: 2022-04-25 | Discharge: 2022-05-04 | DRG: 945 | Disposition: A | Discharge status: Home Health | Payer: Medicare PPO | Source: Intra-hospital | Attending: Physical Medicine and Rehabilitation | Admitting: Physical Medicine and Rehabilitation

## 2022-04-25 ENCOUNTER — Other Ambulatory Visit: Payer: Self-pay

## 2022-04-25 DIAGNOSIS — I469 Cardiac arrest, cause unspecified: Secondary | ICD-10-CM | POA: Diagnosis not present

## 2022-04-25 DIAGNOSIS — Z825 Family history of asthma and other chronic lower respiratory diseases: Secondary | ICD-10-CM | POA: Diagnosis not present

## 2022-04-25 DIAGNOSIS — J90 Pleural effusion, not elsewhere classified: Secondary | ICD-10-CM | POA: Diagnosis not present

## 2022-04-25 DIAGNOSIS — G40909 Epilepsy, unspecified, not intractable, without status epilepticus: Secondary | ICD-10-CM | POA: Diagnosis present

## 2022-04-25 DIAGNOSIS — Z833 Family history of diabetes mellitus: Secondary | ICD-10-CM

## 2022-04-25 DIAGNOSIS — E876 Hypokalemia: Secondary | ICD-10-CM | POA: Diagnosis present

## 2022-04-25 DIAGNOSIS — J45909 Unspecified asthma, uncomplicated: Secondary | ICD-10-CM | POA: Diagnosis present

## 2022-04-25 DIAGNOSIS — Z8249 Family history of ischemic heart disease and other diseases of the circulatory system: Secondary | ICD-10-CM | POA: Diagnosis not present

## 2022-04-25 DIAGNOSIS — E785 Hyperlipidemia, unspecified: Secondary | ICD-10-CM | POA: Diagnosis present

## 2022-04-25 DIAGNOSIS — Z887 Allergy status to serum and vaccine status: Secondary | ICD-10-CM

## 2022-04-25 DIAGNOSIS — Z809 Family history of malignant neoplasm, unspecified: Secondary | ICD-10-CM

## 2022-04-25 DIAGNOSIS — G9340 Encephalopathy, unspecified: Secondary | ICD-10-CM | POA: Diagnosis not present

## 2022-04-25 DIAGNOSIS — I119 Hypertensive heart disease without heart failure: Secondary | ICD-10-CM | POA: Diagnosis present

## 2022-04-25 DIAGNOSIS — J189 Pneumonia, unspecified organism: Secondary | ICD-10-CM | POA: Diagnosis not present

## 2022-04-25 DIAGNOSIS — Z87891 Personal history of nicotine dependence: Secondary | ICD-10-CM

## 2022-04-25 DIAGNOSIS — Z79899 Other long term (current) drug therapy: Secondary | ICD-10-CM

## 2022-04-25 DIAGNOSIS — K219 Gastro-esophageal reflux disease without esophagitis: Secondary | ICD-10-CM | POA: Diagnosis present

## 2022-04-25 DIAGNOSIS — Z7984 Long term (current) use of oral hypoglycemic drugs: Secondary | ICD-10-CM

## 2022-04-25 DIAGNOSIS — D649 Anemia, unspecified: Secondary | ICD-10-CM | POA: Diagnosis not present

## 2022-04-25 DIAGNOSIS — Z794 Long term (current) use of insulin: Secondary | ICD-10-CM | POA: Diagnosis not present

## 2022-04-25 DIAGNOSIS — R32 Unspecified urinary incontinence: Secondary | ICD-10-CM | POA: Diagnosis present

## 2022-04-25 DIAGNOSIS — J9811 Atelectasis: Secondary | ICD-10-CM | POA: Diagnosis present

## 2022-04-25 DIAGNOSIS — M96A3 Multiple fractures of ribs associated with chest compression and cardiopulmonary resuscitation: Secondary | ICD-10-CM | POA: Diagnosis present

## 2022-04-25 DIAGNOSIS — Z7982 Long term (current) use of aspirin: Secondary | ICD-10-CM

## 2022-04-25 DIAGNOSIS — G4700 Insomnia, unspecified: Secondary | ICD-10-CM | POA: Diagnosis not present

## 2022-04-25 DIAGNOSIS — J811 Chronic pulmonary edema: Secondary | ICD-10-CM | POA: Diagnosis not present

## 2022-04-25 DIAGNOSIS — R5381 Other malaise: Principal | ICD-10-CM | POA: Diagnosis present

## 2022-04-25 LAB — GLUCOSE, CAPILLARY
Glucose-Capillary: 146 mg/dL — ABNORMAL HIGH (ref 70–99)
Glucose-Capillary: 181 mg/dL — ABNORMAL HIGH (ref 70–99)
Glucose-Capillary: 184 mg/dL — ABNORMAL HIGH (ref 70–99)
Glucose-Capillary: 230 mg/dL — ABNORMAL HIGH (ref 70–99)
Glucose-Capillary: 260 mg/dL — ABNORMAL HIGH (ref 70–99)
Glucose-Capillary: 52 mg/dL — ABNORMAL LOW (ref 70–99)

## 2022-04-25 LAB — RESPIRATORY PANEL BY PCR

## 2022-04-25 LAB — CBC
HCT: 24.9 % — ABNORMAL LOW (ref 36.0–46.0)
Hemoglobin: 7.6 g/dL — ABNORMAL LOW (ref 12.0–15.0)
MCH: 23.2 pg — ABNORMAL LOW (ref 26.0–34.0)
MCHC: 30.5 g/dL (ref 30.0–36.0)
MCV: 75.9 fL — ABNORMAL LOW (ref 80.0–100.0)
Platelets: 313 10*3/uL (ref 150–400)
RBC: 3.28 MIL/uL — ABNORMAL LOW (ref 3.87–5.11)
RDW: 15.7 % — ABNORMAL HIGH (ref 11.5–15.5)
WBC: 7.5 10*3/uL (ref 4.0–10.5)
nRBC: 0.3 % — ABNORMAL HIGH (ref 0.0–0.2)

## 2022-04-25 LAB — BASIC METABOLIC PANEL
Anion gap: 10 (ref 5–15)
BUN: 16 mg/dL (ref 8–23)
CO2: 31 mmol/L (ref 22–32)
Calcium: 8.4 mg/dL — ABNORMAL LOW (ref 8.9–10.3)
Chloride: 98 mmol/L (ref 98–111)
Creatinine, Ser: 1.01 mg/dL — ABNORMAL HIGH (ref 0.44–1.00)
GFR, Estimated: 60 mL/min — ABNORMAL LOW (ref >60)
Glucose, Bld: 207 mg/dL — ABNORMAL HIGH (ref 70–99)
Potassium: 3.4 mmol/L — ABNORMAL LOW (ref 3.5–5.1)
Sodium: 139 mmol/L (ref 135–145)

## 2022-04-25 LAB — MAGNESIUM: Magnesium: 2.2 mg/dL (ref 1.7–2.4)

## 2022-04-25 MED ORDER — POTASSIUM CHLORIDE CRYS ER 20 MEQ PO TBCR
40.0000 meq | EXTENDED_RELEASE_TABLET | ORAL | Status: AC
  Administered 2022-04-25 (×2): 40 meq via ORAL
  Filled 2022-04-25 (×2): qty 2

## 2022-04-25 MED ORDER — METHOCARBAMOL 500 MG PO TABS
500.0000 mg | ORAL_TABLET | Freq: Four times a day (QID) | ORAL | Status: DC | PRN
  Administered 2022-05-01 – 2022-05-03 (×3): 500 mg via ORAL
  Filled 2022-04-25 (×3): qty 1

## 2022-04-25 MED ORDER — ADULT MULTIVITAMIN W/MINERALS CH
1.0000 | ORAL_TABLET | Freq: Every day | ORAL | Status: DC
  Administered 2022-04-26 – 2022-05-04 (×9): 1 via ORAL
  Filled 2022-04-25 (×9): qty 1

## 2022-04-25 MED ORDER — ROSUVASTATIN CALCIUM 20 MG PO TABS
20.0000 mg | ORAL_TABLET | Freq: Every day | ORAL | Status: DC
  Administered 2022-04-26 – 2022-05-04 (×9): 20 mg via ORAL
  Filled 2022-04-25 (×9): qty 1

## 2022-04-25 MED ORDER — AMLODIPINE BESYLATE 5 MG PO TABS
7.5000 mg | ORAL_TABLET | Freq: Every day | ORAL | Status: DC
  Administered 2022-04-26 – 2022-05-04 (×9): 7.5 mg via ORAL
  Filled 2022-04-25 (×9): qty 1

## 2022-04-25 MED ORDER — DIPHENHYDRAMINE HCL 12.5 MG/5ML PO ELIX: 12.5000 mg | ORAL_SOLUTION | Freq: Four times a day (QID) | ORAL | Status: DC | PRN

## 2022-04-25 MED ORDER — INSULIN GLARGINE-YFGN 100 UNIT/ML ~~LOC~~ SOLN: 18.0000 [IU] | Freq: Two times a day (BID) | SUBCUTANEOUS | 11 refills | Status: DC

## 2022-04-25 MED ORDER — LOSARTAN POTASSIUM 50 MG PO TABS
25.0000 mg | ORAL_TABLET | Freq: Every day | ORAL | Status: DC
  Administered 2022-04-26 – 2022-05-04 (×9): 25 mg via ORAL
  Filled 2022-04-25 (×9): qty 1

## 2022-04-25 MED ORDER — MAGNESIUM OXIDE -MG SUPPLEMENT 400 (240 MG) MG PO TABS
400.0000 mg | ORAL_TABLET | Freq: Every day | ORAL | Status: DC
  Administered 2022-04-26 – 2022-04-27 (×2): 400 mg via ORAL
  Filled 2022-04-25 (×2): qty 1

## 2022-04-25 MED ORDER — PROCHLORPERAZINE MALEATE 5 MG PO TABS: 5.0000 mg | ORAL_TABLET | Freq: Four times a day (QID) | ORAL | Status: DC | PRN

## 2022-04-25 MED ORDER — AMLODIPINE BESYLATE 2.5 MG PO TABS: 7.5000 mg | ORAL_TABLET | Freq: Every day | ORAL | 1 refills | Status: DC

## 2022-04-25 MED ORDER — APIXABAN 5 MG PO TABS
5.0000 mg | ORAL_TABLET | Freq: Two times a day (BID) | ORAL | Status: DC
  Administered 2022-04-25 – 2022-05-04 (×18): 5 mg via ORAL
  Filled 2022-04-25 (×18): qty 1

## 2022-04-25 MED ORDER — ENSURE ENLIVE PO LIQD
237.0000 mL | Freq: Three times a day (TID) | ORAL | Status: DC
  Administered 2022-04-26 – 2022-05-02 (×20): 237 mL via ORAL

## 2022-04-25 MED ORDER — POLYETHYLENE GLYCOL 3350 17 G PO PACK: 17.0000 g | PACK | Freq: Every day | ORAL | Status: DC | PRN

## 2022-04-25 MED ORDER — APIXABAN 5 MG PO TABS: 5.0000 mg | ORAL_TABLET | Freq: Two times a day (BID) | ORAL | Status: DC

## 2022-04-25 MED ORDER — FLEET ENEMA 7-19 GM/118ML RE ENEM: 1.0000 | ENEMA | Freq: Once | RECTAL | Status: DC | PRN

## 2022-04-25 MED ORDER — ALUM & MAG HYDROXIDE-SIMETH 200-200-20 MG/5ML PO SUSP: 30.0000 mL | ORAL | Status: DC | PRN

## 2022-04-25 MED ORDER — ACETAMINOPHEN 325 MG PO TABS
325.0000 mg | ORAL_TABLET | ORAL | Status: DC | PRN
  Administered 2022-04-26 – 2022-05-01 (×3): 650 mg via ORAL
  Filled 2022-04-25 (×3): qty 2

## 2022-04-25 MED ORDER — FUROSEMIDE 40 MG PO TABS
40.0000 mg | ORAL_TABLET | Freq: Every day | ORAL | Status: DC
  Administered 2022-04-26 – 2022-04-29 (×4): 40 mg via ORAL
  Filled 2022-04-25 (×4): qty 1

## 2022-04-25 MED ORDER — LOSARTAN POTASSIUM 25 MG PO TABS: 25.0000 mg | ORAL_TABLET | Freq: Every day | ORAL | 1 refills | Status: DC

## 2022-04-25 MED ORDER — INSULIN ASPART 100 UNIT/ML IJ SOLN: 8.0000 [IU] | Freq: Three times a day (TID) | INTRAMUSCULAR | 11 refills | Status: DC

## 2022-04-25 MED ORDER — FUROSEMIDE 40 MG PO TABS: 40.0000 mg | ORAL_TABLET | Freq: Every day | ORAL | 1 refills | Status: DC

## 2022-04-25 MED ORDER — MAGNESIUM OXIDE -MG SUPPLEMENT 400 (240 MG) MG PO TABS: 400.0000 mg | ORAL_TABLET | Freq: Every day | ORAL | Status: DC

## 2022-04-25 MED ORDER — PROCHLORPERAZINE EDISYLATE 10 MG/2ML IJ SOLN: 5.0000 mg | Freq: Four times a day (QID) | INTRAMUSCULAR | Status: DC | PRN

## 2022-04-25 MED ORDER — INSULIN GLARGINE-YFGN 100 UNIT/ML ~~LOC~~ SOLN
18.0000 [IU] | Freq: Two times a day (BID) | SUBCUTANEOUS | Status: DC
  Administered 2022-04-25 – 2022-04-27 (×5): 18 [IU] via SUBCUTANEOUS
  Filled 2022-04-25 (×7): qty 0.18

## 2022-04-25 MED ORDER — PANTOPRAZOLE SODIUM 40 MG PO TBEC: 40.0000 mg | DELAYED_RELEASE_TABLET | Freq: Every day | ORAL | 1 refills | Status: DC

## 2022-04-25 MED ORDER — GUAIFENESIN-DM 100-10 MG/5ML PO SYRP: 5.0000 mL | ORAL_SOLUTION | Freq: Four times a day (QID) | ORAL | Status: DC | PRN

## 2022-04-25 MED ORDER — INSULIN ASPART 100 UNIT/ML IJ SOLN
8.0000 [IU] | Freq: Three times a day (TID) | INTRAMUSCULAR | Status: DC
  Administered 2022-04-26 – 2022-05-03 (×16): 8 [IU] via SUBCUTANEOUS

## 2022-04-25 MED ORDER — ALBUTEROL SULFATE (2.5 MG/3ML) 0.083% IN NEBU: 2.5000 mg | INHALATION_SOLUTION | RESPIRATORY_TRACT | Status: DC | PRN

## 2022-04-25 MED ORDER — POTASSIUM CHLORIDE CRYS ER 20 MEQ PO TBCR: 20.0000 meq | EXTENDED_RELEASE_TABLET | Freq: Every day | ORAL | Status: DC

## 2022-04-25 MED ORDER — SORBITOL 70 % SOLN: 30.0000 mL | Freq: Every day | Status: DC | PRN

## 2022-04-25 MED ORDER — ARTIFICIAL TEARS OPHTHALMIC OINT: TOPICAL_OINTMENT | OPHTHALMIC | Status: DC | PRN

## 2022-04-25 MED ORDER — FUROSEMIDE 40 MG PO TABS: 40.0000 mg | ORAL_TABLET | Freq: Every day | ORAL | Status: DC

## 2022-04-25 MED ORDER — INSULIN ASPART 100 UNIT/ML IJ SOLN
0.0000 [IU] | Freq: Three times a day (TID) | INTRAMUSCULAR | Status: DC
  Administered 2022-04-26 – 2022-04-27 (×2): 3 [IU] via SUBCUTANEOUS
  Administered 2022-04-27: 2 [IU] via SUBCUTANEOUS
  Administered 2022-04-28: 3 [IU] via SUBCUTANEOUS
  Administered 2022-04-29: 5 [IU] via SUBCUTANEOUS
  Administered 2022-04-29: 3 [IU] via SUBCUTANEOUS
  Administered 2022-04-30: 2 [IU] via SUBCUTANEOUS
  Administered 2022-04-30: 5 [IU] via SUBCUTANEOUS
  Administered 2022-05-01 – 2022-05-02 (×3): 3 [IU] via SUBCUTANEOUS

## 2022-04-25 MED ORDER — PROCHLORPERAZINE 25 MG RE SUPP: 12.5000 mg | Freq: Four times a day (QID) | RECTAL | Status: DC | PRN

## 2022-04-25 MED ORDER — TRAZODONE HCL 50 MG PO TABS: 25.0000 mg | ORAL_TABLET | Freq: Every evening | ORAL | Status: DC | PRN

## 2022-04-25 NOTE — Progress Notes (Signed)
Hypoglycemic Event  CBG: 52   Treatment: 8 oz juice/soda  Symptoms: Shaky  Follow-up CBG: [id number] CBG Result:146   Possible Reasons for Event: Inadequate meal intake  Comments/MD notified:N/A    Deborah Neal

## 2022-04-25 NOTE — Discharge Summary (Signed)
Physician Discharge Summary  Deborah Neal WUJ:[id number] DOB: 05-Aug-1950 DOA: 04/09/2022  PCP: Biagio Borg, MD  Admit date: 04/09/2022 Discharge date: 04/25/2022  Time spent: 40 minutes  Recommendations for Outpatient Follow-up:  Follow outpatient CBC/CMP  Follow outpatient discussion regarding duration of anticoagulation - lifelong/indefinite (until risk outweigh benefits) would seem appropriate based on her presentation, no provoking cause identified - follow with PCP, could consider heme follow up Needs routine cancer screening Repeat CXR in [place] week or so.  Needs repeat CT scan, plan is with pulmonology outpatient. Pulm recommended bipap qhs and prn, she's been doing fine without this - can follow in CIR - outpatient sleep study recommended Follow pending free T3 - repeat thyroid function tests in [place] few weeks Follow elevated pulm artery systolic pressure outpatient - needs repeat echo in 3 months, follow with cards vs pulm Follow with cards outpatient for elevated troponin here Hematuria here, likely related to anticoagulation - follow outpatient, consider urology follow up at some point Follow anemia outpatient  Follow blood pressure, uptitrate home meds as tolerated Follow blood sugars, significantly reduced insulin regimen compared to home - adjust in CIR as needed  Discharge Diagnoses:  Principal Problem:   Cardiac arrest [facility]) Active Problems:   Acute respiratory failure with hypoxia (HCC)   Lactic acidosis   Seizure ([place])   Type 2 diabetes mellitus without complication, with long-term current use of insulin ([place])   Shock ([place])   Encephalopathy acute   Discharge Condition: stable  Diet recommendation: heart healthy, diabetic  Filed Weights   04/22/22 0426 04/23/22 0630 04/24/22 0500  Weight: 77.6 kg 77.7 kg 74.3 kg    History of present illness:  71 year old female with past medical history as below, which is significant for hypertension, hyperlipidemia, type 2  diabetes mellitus, and asthma. She was in her usual state of health until approximately 5:30 AM on 12/15 when she was seen by granddaughter. Family attempted to contact her via phone around 7 AM and she did not answer. When they later went to check on her around 10 AM she was unresponsive in her bed with red emesis seen around her. Upon arrival to the fire department the patient was unresponsive but did have pulse and respirations. Then suffered seizure. Upon EMS arrival patient lost pulses and CPR was initiated. CPR less than 5 minutes. Patient demonstrated seizure-like activity en route to [facility] emergency department. Improved with benzodiazepine administration. Upon arrival to emergency department the patient was intubated. She remained unresponsive at the time PCCM was asked to admit.    Significant Events 12/15 admit to [place] s/p cardiac arrest 12/17 MRI - neg acute, stable moderate atrophy and white matter disease. This likely reflects the sequela of chronic microvascular ischemia. 12/20-weaning well, no cuff leak. Steroids started.  12/20 not tolerating SBT, rapid rate.  12/22 Heparin stopped 2/2 hematuria 12/23>> Extubated 12/24 >> Heparin restarted , at Shalondra Wunschel lower therapeutic range 12/25 HGB from from 8.8 to 7.3 after heparin resumed 12/25 TRH assumed care 12/27  Transitioned to eliquis for presumed massive PE.  She's gradually improved with continued diuresis here.  I think ok for inpatient rehab today.  See below for additional details.  Hospital Course:  Assessment and Plan: Out of Hospital PEA Cardiac Arrest In hospital Vfib/vtach arrest in setting of severe hypokalemia Presumed Massive Pulmonary Embolism s/p TNKase Completed normothermia protocol s/p TNKase Transitioned to eliquis  LE [place] negative for DVT Echo with findings concerning for clot in transit, RV  function mildly reduced with free wall hypokinesis, EF 65-70% no RWMA, grade 1 diastolic dysfunction CT PE protocol  with PE with very small thrombus burden (this was on 12/28)   Acute Hypoxic Respiratory Failure  Multifocal Pneumonia  Aspiration Pneumonia  Bilateral Pleural Effusions Serratia Marcescens Pneumonia Continue cefepime 12/23 - 29. Has lasix ordered, will continue as tolerated repeat CT scan (with patchy infiltrates in both lungs, decrease in amount of bilateral effusions)  CXR 12/31 CXR with improving interstitial and airspace opacities c/w improving infection and edema, moderate pulm vascular congestion Completed abx, will continue to monitor off abx Follow    Moderately Elevated PASP Needs outpatient f/u with cards vs pulm  Pericardial Effusion Follow limited echo (12/29 without effusion)   Acute Kidney Injury Creatinine peaked at 5.64 Baseline appears to be <1 (06/2021) Improving, follow with lasix    Diffuse Body Wall Edema Noted on CT 12/19  Continue lasix as tolerated, ok to switch to PO lasix I think now - would repeat CXR in [place] week or so to ensure continued improvement Follow albumin 2.6, UA without protein   Seizure Like Activity EEG with findings suggestive of severe to profound diffuse encephalopathy, no seizures or epileptiform discharges Keppra has been d/c'd MRI brain without acute intracranial abnormality or significant interval change   Demand Ischemia  In the setting of presumed massive PE, s/p CPR after PEA arrest  Will need cardiology follow up as an outpatient  LDL 129   Anemia Fluctuating, relatively stable Follow iron, b12, folate, ferritin (suggestive of chronic dz) CT scan 12/19 negative for retroperitoneal hematoma   Hematuria Noted in ICU resolved Needs urology follow up   Dense Atelectasis in LUL with decreased volume in L Lung Concern for chronic obstruction of LUL bronchus - needs short term follow up CT in 3 months vs endoscopy or PET CT   Hypertension Lasix Resume losartan, lower dose, uptitrate to home as tolerated Amlodipine was  started 12/26   Subclinical Hypothyroidism Elevated TSH, free T4 wnl - pending T3 Repeat in [place] few weeks   Hypernatremia Improved  Hypokalemia  Hypomagnesemia Replace and follow  DKA Continue basal bolus regimen (on Chino Sardo very high dose regimen at home, she's receiving Carolynn Tuley fraction of this currently) Adjust as needed in CIR Holding jardiance for now with recent AKI   HLD Resume crestor LDL 129  Needs outpatient sleep study      Procedures: Echo IMPRESSIONS     1. Left ventricular ejection fraction, by estimation, is 60 to 65%. The  left ventricle has normal function. The left ventricle has no regional  wall motion abnormalities. Left ventricular diastolic parameters were  normal.   2. Right ventricular systolic function is normal. The right ventricular  size is normal. There is moderately elevated pulmonary artery systolic  pressure.   3. The mitral valve is normal in structure. No evidence of mitral valve  regurgitation. No evidence of mitral stenosis.   4. The aortic valve is normal in structure. Aortic valve regurgitation is  not visualized. No aortic stenosis is present.   5. The inferior vena cava is normal in size with greater than 50%  respiratory variability, suggesting right atrial pressure of 3 mmHg.   LE [place] Summary:  RIGHT:  - There is no evidence of deep vein thrombosis in the lower extremity.    - Terryl Niziolek cystic structure is found in the popliteal fossa measuring 5.0 x 1.1 x  2.9 cm.    LEFT:  - There  is no evidence of deep vein thrombosis in the lower extremity.    - No cystic structure found in the popliteal fossa.     *See table(s) above for measurements and observations.    Echo IMPRESSIONS     1. There appears to be Shajuana Mclucas mobile echodensity in the RV outflow tract  (images 45-51), not well visualized but given presentation with cardiac  arrest, concerning for thrombus and recommend PE evaluation. RV function  appears mildly reduced with free wall   hypokinesis   2. Left ventricular ejection fraction, by estimation, is 65 to 70%. The  left ventricle has normal function. The left ventricle has no regional  wall motion abnormalities. There is mild left ventricular hypertrophy.  Left ventricular diastolic parameters  are consistent with Grade I diastolic dysfunction (impaired relaxation).   3. Right ventricular systolic function is mildly reduced. The right  ventricular size is normal. There is mildly elevated pulmonary artery  systolic pressure.   4. The mitral valve is normal in structure. Trivial mitral valve  regurgitation.   5. The aortic valve is tricuspid. Aortic valve regurgitation is not  visualized. No aortic stenosis is present.   6. The inferior vena cava is normal in size with greater than 50%  respiratory variability, suggesting right atrial pressure of 3 mmHg.    Consultations: PCCM Cardiology renal  Discharge Exam: Vitals:   04/25/22 0744 04/25/22 1045  BP: (!) 122/59 127/64  Pulse: 79 78  Resp:    Temp: 98 F (36.7 C)   SpO2: 96% 94%   Feels well Daughter at bedside  General: No acute distress.  Looks best that I've seen her in several days. Cardiovascular: RRR Lungs: unlabored Abdomen: Soft, nontender, nondistended Neurological: Alert and oriented 3. Moves all extremities 4 with equal strength. Cranial nerves II through XII grossly intact. Extremities: No clubbing or cyanosis. No edema.   Discharge Instructions   Discharge Instructions     Call MD for:  difficulty breathing, headache or visual disturbances   Complete by: As directed    Call MD for:  extreme fatigue   Complete by: As directed    Call MD for:  hives   Complete by: As directed    Call MD for:  persistant dizziness or light-headedness   Complete by: As directed    Call MD for:  persistant nausea and vomiting   Complete by: As directed    Call MD for:  redness, tenderness, or signs of infection (pain, swelling, redness, odor  or green/yellow discharge around incision site)   Complete by: As directed    Call MD for:  severe uncontrolled pain   Complete by: As directed    Call MD for:  temperature >100.4   Complete by: As directed    Diet - low sodium heart healthy   Complete by: As directed    Discharge instructions   Complete by: As directed    You were seen after Bonniejean Piano cardiac arrest.  You had several events.  We think this was related to Keyaan Lederman massive pulmonary embolism (large blood clot to the lungs), electrolyte abnormalities, etc.    You were treated with clot busters.  You've now significantly improved.  We have you on eliquis which is Juliany Daughety blood thinner.  This needs to be continued indefinitely.  You should discuss with your PCP the plan for the duration for your therapy.  They may want you to follow up with hematology to help determine this.  Your cancer screening  needs to be up to date.  Please review this with your PCP.  Your echo shows moderately elevated pulmonary pressures.  You should follow this with your PCP, cardiologist, or pulmonologist outpatient.  You'll need Missey Hasley repeat echo at some time.  You had pneumonia.  You've completed Keyly Baldonado course of antibiotics for this.  You should have repeat chest x ray in 3-4 weeks (or sooner if you have new or worsened symptoms).  You had an abnormal CT scan which was concerning for obstruction of the left upper lob bronchus.  This should be followed outpatient with Gazella Anglin CT scan within 3 months (vs pet scan or endoscopy).  Please discuss this and your imaging with your PCP.  We've been giving you lasix, Jamielyn Petrucci diuretic (or fluid pill).  Continue this as tolerated in rehab.  You should take this with potassium.  They'll adjust this as needed while you're in inpatient rehab.  You had seizure like activities and were briefly on an antiseizure medicine.  This has been stopped at this time.  You'll need to follow up with cardiology outpatient for an ischemic evaluation at some time.  You have  anemia (low blood counts).  This is at least partially related to your acute illness.  As you're on blood thinners, your blood count should be checked intermittently.  If you have lightheadedness/dizziness and/or shortness of breath with exertion, these are signs that your blood count may need to be checked.  We've significantly reduced your insulin dose here and are gradually adjusting it as needed.  You'll need adjustment in rehab and at discharge, will need to check what your new dose of insulin and diabetes regimen is.  Your thyroid function is abnormal.  You have Shilynn Hoch pending lab.  You may need repeat labs outpatient.  Follow this with your PCP.  You have Jereme Loren cystic structure behind your right knee that can be followed outpatient with your PCP or orthopedics.  Return for new, recurrent, or worsening symptoms.  Please ask your PCP to request records from this hospitalization so they know what was done and what the next steps will be.   Discharge wound care:   Complete by: As directed    Frequent turns, foam dressing Continue evaluation in rehab   Increase activity slowly   Complete by: As directed       Allergies as of 04/25/2022       Reactions   Shingrix [zoster Vac Recomb Adjuvanted]    Allergy to original live zoster - rash        Medication List     STOP taking these medications    aspirin 81 MG tablet   empagliflozin 25 MG Tabs tablet Commonly known as: Jardiance   HumuLIN R U-500 KwikPen 500 UNIT/ML KwikPen Generic drug: insulin regular human CONCENTRATED   multivitamin with minerals Tabs tablet   potassium chloride 10 MEQ tablet Commonly known as: KLOR-CON   tiZANidine 2 MG tablet Commonly known as: ZANAFLEX   Toujeo Max SoloStar 300 UNIT/ML Solostar Pen Generic drug: insulin glargine (2 Unit Dial)       TAKE these medications    albuterol 108 (90 Base) MCG/ACT inhaler Commonly known as: ProAir HFA INHALE 2 PUFFS BY MOUTH EVERY 6 HOURS AS NEEDED FOR  WHEEZING OR SHORTNESS OF BREATH What changed:  how much to take how to take this when to take this reasons to take this additional instructions   amLODipine 2.5 MG tablet Commonly known as: NORVASC Take 3 tablets (7.5  mg total) by mouth daily. Start taking on: April 26, 2022   apixaban 5 MG Tabs tablet Commonly known as: ELIQUIS Take 1 tablet (5 mg total) by mouth 2 (two) times daily.   furosemide 40 MG tablet Commonly known as: LASIX Take 1 tablet (40 mg total) by mouth daily. Start taking on: April 26, 2022   insulin aspart 100 UNIT/ML injection Commonly known as: novoLOG Inject 8 Units into the skin 3 (three) times daily with meals.   insulin glargine-yfgn 100 UNIT/ML injection Commonly known as: SEMGLEE Inject 0.18 mLs (18 Units total) into the skin 2 (two) times daily.   Lancets Misc Use as directed four times daily  E11.9   losartan 25 MG tablet Commonly known as: COZAAR Take 1 tablet (25 mg total) by mouth daily. Start taking on: April 26, 2022 What changed:  medication strength how much to take   magnesium oxide 400 (240 Mg) MG tablet Commonly known as: MAG-OX Take 1 tablet (400 mg total) by mouth daily. Start taking on: April 26, 2022   ONE TOUCH ULTRA 2 w/Device Kit Use as directed   [employer] test strip Generic drug: glucose blood USE AS DIRECTED THREE TIMES DAILY   OneTouch Verio test strip Generic drug: glucose blood USE TO CHECK BLOOD SUGARS THREE TIMES DAILY   pantoprazole 40 MG tablet Commonly known as: Protonix Take 1 tablet (40 mg total) by mouth daily.   potassium chloride SA 20 MEQ tablet Commonly known as: KLOR-CON M Take 1 tablet (20 mEq total) by mouth daily.   rosuvastatin 20 MG tablet Commonly known as: CRESTOR 1 tab by mouth once daily               Discharge Care Instructions  (From admission, onward)           Start     Ordered   04/25/22 0000  Discharge wound care:       Comments: Frequent  turns, foam dressing Continue evaluation in rehab   04/25/22 1206           Allergies  Allergen Reactions   Shingrix [Zoster Vac Recomb Adjuvanted]     Allergy to original live zoster - rash    Follow-up Information     Dunn, Dayna N, PA-C Follow up.   Specialties: Cardiology, Radiology Why: [place] location - cardiology follow-up arranged on Friday May 21, 2022 at 10:55 AM (Arrive by 10:40 AM). Lisbeth Renshaw is one of the PAs that works with Dr. Angelena Form and our heart team. Contact information: [address] [place] [address] [phone]         Biagio Borg, MD Follow up.   Specialties: Internal Medicine, Radiology Contact information: [place] [address] [phone]         [facility] Follow up.   Specialty: Pulmonology Why: follow abnormal imaging with pulmonology Contact information: [place] [id number] [phone]                 The results of significant diagnostics from this hospitalization (including imaging, microbiology, ancillary and laboratory) are listed below for reference.    Significant Diagnostic Studies: DG CHEST PORT 1 VIEW  Result Date: 04/25/2022 CLINICAL DATA:  Shortness of breath.  Cardiac arrest. EXAM: PORTABLE CHEST 1 VIEW COMPARISON:  One-view chest x-ray 04/24/2022. CT angio chest 04/22/2022 FINDINGS: Heart is enlarged. Overall lung volumes have improved. Interstitial and  airspace opacities are improving on the right. Areas of atelectasis are present on the left. Moderate pulmonary vascular congestion remains. IMPRESSION: 1. Improving interstitial and airspace opacities consistent with improving infection and edema. 2. Cardiomegaly and moderate pulmonary vascular congestion. Electronically Signed   By: San Morelle M.D.   On: 04/25/2022 10:23   DG CHEST PORT 1 VIEW  Result Date: 04/24/2022 CLINICAL  DATA:  Pneumonia.  Pleural effusion. EXAM: PORTABLE CHEST 1 VIEW COMPARISON:  04/19/2022 FINDINGS: Stable mild cardiomegaly. Decreased opacity is seen in the central left upper andaa lower lobes. Worsening diffuse airspace opacity is seen throughout the right lung. Evidence of pneumothorax or pleural effusion. IMPRESSION: Worsening diffuse right lung airspace opacity. Decreased opacity in central left upper and lower lobes. Electronically Signed   By: Marlaine Hind M.D.   On: 04/24/2022 09:10   ECHOCARDIOGRAM LIMITED  Result Date: 04/23/2022    ECHOCARDIOGRAM LIMITED REPORT   Patient Name:   MERYLE PUGMIRE Date of Exam: 04/23/2022 Medical Rec #:  [id number]    Height:       63.0 in Accession #:    [id number]   Weight:       171.3 lb Date of Birth:  January 14, 1951    BSA:          1.810 m Patient Age:    70 years     BP:           133/69 mmHg Patient Gender: F            HR:           83 bpm. Exam Location:  Inpatient Procedure: Limited Echo, Cardiac Doppler and Color Doppler Indications:    pericardial effusion  History:        Patient has prior history of Echocardiogram examinations, most                 recent 04/09/2022. Arrythmias:Cardiac Arrest; Risk                 Factors:Diabetes and Hypertension.  Sonographer:    Johny Chess RDCS Referring Phys: [place]  1. Left ventricular ejection fraction, by estimation, is 60 to 65%. The left ventricle has normal function. The left ventricle has no regional wall motion abnormalities. Left ventricular diastolic parameters were normal.  2. Right ventricular systolic function is normal. The right ventricular size is normal. There is moderately elevated pulmonary artery systolic pressure.  3. The mitral valve is normal in structure. No evidence of mitral valve regurgitation. No evidence of mitral stenosis.  4. The aortic valve is normal in structure. Aortic valve regurgitation is not visualized. No aortic stenosis is present.  5. The  inferior vena cava is normal in size with greater than 50% respiratory variability, suggesting right atrial pressure of 3 mmHg. FINDINGS  Left Ventricle: Left ventricular ejection fraction, by estimation, is 60 to 65%. The left ventricle has normal function. The left ventricle has no regional wall motion abnormalities. The left ventricular internal cavity size was normal in size. There is  no left ventricular hypertrophy. Left ventricular diastolic parameters were normal. Indeterminate filling pressures. Right Ventricle: The right ventricular size is normal. No increase in right ventricular wall thickness. Right ventricular systolic function is normal. There is moderately elevated pulmonary artery systolic pressure. The tricuspid regurgitant velocity is 3.34 m/s, and with an assumed right atrial pressure of 3 mmHg, the estimated right ventricular systolic pressure is 46.8 mmHg. Left Atrium: Left atrial size  was normal in size. Right Atrium: Right atrial size was normal in size. Pericardium: There is no evidence of pericardial effusion. Mitral Valve: The mitral valve is normal in structure. No evidence of mitral valve stenosis. Tricuspid Valve: The tricuspid valve is normal in structure. Tricuspid valve regurgitation is trivial. No evidence of tricuspid stenosis. Aortic Valve: The aortic valve is normal in structure. Aortic valve regurgitation is not visualized. No aortic stenosis is present. Pulmonic Valve: The pulmonic valve was normal in structure. Pulmonic valve regurgitation is not visualized. No evidence of pulmonic stenosis. Aorta: The aortic root is normal in size and structure. Venous: The inferior vena cava is normal in size with greater than 50% respiratory variability, suggesting right atrial pressure of 3 mmHg. IAS/Shunts: No atrial level shunt detected by color flow Doppler. Additional Comments: Spectral Doppler performed. Color Doppler performed.   Diastology LV e' medial:    9.32 cm/s LV E/e' medial:   11.7 LV e' lateral:   12.90 cm/s LV E/e' lateral: 8.4  IVC IVC diam: 1.50 cm AORTIC VALVE LVOT Vmax:   138.00 cm/s LVOT Vmean:  90.700 cm/s LVOT VTI:    0.244 m MITRAL VALVE                TRICUSPID VALVE MV Area (PHT): 4.80 cm     TR Peak grad:   44.6 mmHg MV Decel Time: 158 msec     TR Vmax:        334.00 cm/s MV E velocity: 109.00 cm/s MV Tatia Petrucci velocity: 93.80 cm/s   SHUNTS MV E/Idriss Quackenbush ratio:  1.16         Systemic VTI: 0.24 m Skeet Latch MD Electronically signed by Skeet Latch MD Signature Date/Time: 04/23/2022/7:18:12 PM    Final    CT Angio Chest Pulmonary Embolism (PE) W or WO Contrast  Addendum Date: 04/22/2022   ADDENDUM REPORT: 04/22/2022 15:42 ADDENDUM: Imaging finding of small thrombus burden PE in right lower lobe and right heart dysfunction were relayed to Dr. Florene Glen by telephone call. Electronically Signed   By: Elmer Picker M.D.   On: 04/22/2022 15:42   Result Date: 04/22/2022 CLINICAL DATA:  Cardiac arrest, abnormal echocardiogram EXAM: CT ANGIOGRAPHY CHEST WITH CONTRAST TECHNIQUE: Multidetector CT imaging of the chest was performed using the standard protocol during bolus administration of intravenous contrast. Multiplanar CT image reconstructions and MIPs were obtained to evaluate the vascular anatomy. RADIATION DOSE REDUCTION: This exam was performed according to the departmental dose-optimization program which includes automated exposure control, adjustment of the mA and/or kV according to patient size and/or use of iterative reconstruction technique. CONTRAST:  28m OMNIPAQUE IOHEXOL 350 MG/ML SOLN COMPARISON:  Noncontrast CT chest done on 04/16/2022 and chest radiographs done on 04/19/2022 FINDINGS: Cardiovascular: There are no intraluminal filling defects in central pulmonary artery branches. In image 575of series 5, filling defect is seen in is subsegmental pulmonary artery branch in the posterior right lower lung field. Motion artifacts limit evaluation of small peripheral  pulmonary artery branches. Coronary artery calcifications are seen. There is homogeneous enhancement in thoracic aorta. There are scattered calcifications in thoracic aorta and its major branches. Small pericardial effusion is present. RV LV ratio is 1.3. Mediastinum/Nodes: There are enlarged lymph nodes in both hilar regions and mediastinum with no interval change. Lungs/Pleura: There is interval removal of endotracheal tube and enteric tube. There is interval improvement in aeration in both mid and lower lung fields. There is interval worsening of infiltrates in right upper lobe. There is  linear focus of dense infiltrate in the medial left upper lobe, possibly chronic atelectasis. Residual patchy infiltrates are noted in right lower lobe. Small to moderate bilateral pleural effusions are seen, more so on the left side. There is interval decrease in amount of bilateral pleural effusions. There is no pneumothorax. Upper Abdomen: Surgical clips are seen in gallbladder fossa. There is 5 mm calcific density in the upper pole of right kidney. Musculoskeletal: No acute findings are seen. Review of the MIP images confirms the above findings. IMPRESSION: There is intraluminal filling defect in small subsegmental branch in right lower lobe suggesting pulmonary embolism with very small thrombus burden. No other definite intraluminal filling defects are seen. RV LV ratio is 1.3 which may suggest chronic right heart dysfunction. Small pericardial effusion is present. Patchy infiltrates are seen in both lungs with areas of improvement and areas of worsening since 04/16/2022. Findings suggest multifocal pneumonia. There is dense atelectasis in the medial left upper lobe which has not changed significantly. There is interval decrease in amount of bilateral pleural effusions. Coronary artery disease.  Right renal calculus. Other findings as described in the body of the report. Electronically Signed: By: Elmer Picker M.D.  On: 04/22/2022 15:32   DG CHEST PORT 1 VIEW  Result Date: 04/19/2022 CLINICAL DATA:  Pneumonia.  Dyspnea. EXAM: PORTABLE CHEST 1 VIEW COMPARISON:  04/15/2022 FINDINGS: Endotracheal tube is been removed since previous study. Right jugular central venous catheter is no longer visualized. No pneumothorax seen. Mixed interstitial and airspace opacities are seen bilaterally, which appear unchanged. Left lower lobe consolidation or collapse is also stable. Masslike opacity is seen in the left hilar region which may be due to Karl Knarr central left lung mass or lymphadenopathy. IMPRESSION: No significant change in diffuse bilateral interstitial and airspace opacities and left lower lobe consolidation or collapse. Masslike opacity in left hilar region, suspicious for central left lung mass or lymphadenopathy. Chest CT with IV contrast is recommended for further evaluation. Electronically Signed   By: Marlaine Hind M.D.   On: 04/19/2022 09:10   CT CHEST WO CONTRAST  Result Date: 04/16/2022 CLINICAL DATA:  Chronic dyspnea EXAM: CT CHEST WITHOUT CONTRAST TECHNIQUE: Multidetector CT imaging of the chest was performed following the standard protocol without IV contrast. RADIATION DOSE REDUCTION: This exam was performed according to the departmental dose-optimization program which includes automated exposure control, adjustment of the mA and/or kV according to patient size and/or use of iterative reconstruction technique. COMPARISON:  Previous studies including CT done on 07/10/2018 and chest radiograph done on 04/15/2022 FINDINGS: Cardiovascular: Coronary artery calcifications are seen. Heart is enlarged in size. There are coarse calcifications in thoracic aorta. Small pericardial effusion is seen. There is ectasia of main pulmonary artery measuring 3.6 cm suggesting possible pulmonary arterial hypertension. Mediastinum/Nodes: Tip of endotracheal tube is 2.6 cm above the carina. Enteric tube is noted with its tip in the  stomach. Lungs/Pleura: There is 8.5 x 2.9 cm soft tissue density in the medial left upper lung field. There is occlusion of left upper lobe bronchus in the left hilar region. Similar findings were seen in previous CT done on 07/10/2018. However, atelectasis near the left hilum appears slightly more prominent in size in comparison with the previous examination. There are patchy infiltrates in both lungs, more so in right upper lobe. There other smaller patchy infiltrates in right middle lobe, both lower lobes and left upper lobe. Left lung is smaller than right. Small to moderate bilateral pleural effusions are seen, larger  on the left side. There is some loculation of effusion in the left apex. There is no pneumothorax. Upper Abdomen: Surgical clips are seen in gallbladder fossa. Musculoskeletal: No acute findings are seen. IMPRESSION: Patchy alveolar infiltrates are seen in both lungs, more so in right upper lobe suggesting multifocal pneumonia. Small to moderate bilateral pleural effusions, more so on the left side which may suggest parapneumonic effusion or empyema. There is dense atelectasis in left upper lobe with decreased volume in the left lung. This may suggest chronic obstruction of left upper lobe bronchus. Similar atelectatic changes were noted in left upper lobe in the previous examination done on 07/10/2018. Short-term follow-up CT in 3 months may be considered to assess stability. Other options would include endoscopy or PET-CT to rule out any obstructive slow-growing neoplastic process in or around the left upper lobe bronchus. There is ectasia of main pulmonary artery suggesting possible pulmonary arterial hypertension. Extensive coronary artery calcifications are seen. Small pericardial effusion is seen. Electronically Signed   By: Elmer Picker M.D.   On: 04/16/2022 19:47   VAS [place] LOWER EXTREMITY VENOUS (DVT)  Result Date: 04/16/2022  Lower Venous DVT Study Patient Name:  KARL KNARR   Date of Exam:   04/15/2022 Medical Rec #: [id number]     Accession #:    [id number] Date of Birth: [birth year]     Patient Gender: F Patient Age:   69 years Exam Location:  [facility] Procedure:      VAS [place] LOWER EXTREMITY VENOUS (DVT) Referring Phys: Georgann Housekeeper --------------------------------------------------------------------------------  Indications: Pulmonary embolism.  Anticoagulation: Heparin. Comparison Study: No prior studies. Performing Technologist: Darlin Coco RDMS, RVT  Examination Guidelines: Ronnae Kaser complete evaluation includes B-mode imaging, spectral Doppler, color Doppler, and power Doppler as needed of all accessible portions of each vessel. Bilateral testing is considered an integral part of Tayari Yankee complete examination. Limited examinations for reoccurring indications may be performed as noted. The reflux portion of the exam is performed with the patient in reverse Trendelenburg.  +---------+---------------+---------+-----------+----------+--------------+ RIGHT    CompressibilityPhasicitySpontaneityPropertiesThrombus Aging +---------+---------------+---------+-----------+----------+--------------+ CFV      Full           Yes      Yes                                 +---------+---------------+---------+-----------+----------+--------------+ SFJ      Full                                                        +---------+---------------+---------+-----------+----------+--------------+ FV Prox  Full                                                        +---------+---------------+---------+-----------+----------+--------------+ FV Mid   Full                                                        +---------+---------------+---------+-----------+----------+--------------+ FV DistalFull                                                        +---------+---------------+---------+-----------+----------+--------------+  PFV      Full                                                         +---------+---------------+---------+-----------+----------+--------------+ POP      Full           Yes      Yes                                 +---------+---------------+---------+-----------+----------+--------------+ PTV      Full                                                        +---------+---------------+---------+-----------+----------+--------------+ PERO     Full                                                        +---------+---------------+---------+-----------+----------+--------------+   +---------+---------------+---------+-----------+----------+--------------+ LEFT     CompressibilityPhasicitySpontaneityPropertiesThrombus Aging +---------+---------------+---------+-----------+----------+--------------+ CFV      Full           Yes      Yes                                 +---------+---------------+---------+-----------+----------+--------------+ SFJ      Full                                                        +---------+---------------+---------+-----------+----------+--------------+ FV Prox  Full                                                        +---------+---------------+---------+-----------+----------+--------------+ FV Mid   Full                                                        +---------+---------------+---------+-----------+----------+--------------+ FV DistalFull                                                        +---------+---------------+---------+-----------+----------+--------------+ PFV      Full                                                        +---------+---------------+---------+-----------+----------+--------------+  POP      Full           Yes      Yes                                 +---------+---------------+---------+-----------+----------+--------------+ PTV      Full                                                         +---------+---------------+---------+-----------+----------+--------------+ PERO     Full                                                        +---------+---------------+---------+-----------+----------+--------------+     Summary: RIGHT: - There is no evidence of deep vein thrombosis in the lower extremity.  - Kevionna Heffler cystic structure is found in the popliteal fossa measuring 5.0 x 1.1 x 2.9 cm.  LEFT: - There is no evidence of deep vein thrombosis in the lower extremity.  - No cystic structure found in the popliteal fossa.  *See table(s) above for measurements and observations. Electronically signed by Deitra Mayo MD on 04/16/2022 at 8:12:49 AM.    Final    DG CHEST PORT 1 VIEW  Result Date: 04/15/2022 CLINICAL DATA:  Hypoxia, respiratory failure EXAM: PORTABLE CHEST 1 VIEW COMPARISON:  04/14/2022 FINDINGS: Endotracheal tube, esophagogastric tube, and right neck vascular catheter remain in unchanged position. Heart size is normal. Bilateral heterogeneous and interstitial airspace opacity, again more conspicuous in the right lung although somewhat improved compared to prior examination. Osseous structures unremarkable. IMPRESSION: 1. Bilateral heterogeneous and interstitial airspace opacity, again more conspicuous in the right lung although somewhat improved compared to prior examination. 2. Support apparatus in unchanged position. Electronically Signed   By: Delanna Ahmadi M.D.   On: 04/15/2022 11:55   DG CHEST PORT 1 VIEW  Result Date: 04/14/2022 CLINICAL DATA:  Acute respiratory failure; history diabetes mellitus, hypertension, asthma EXAM: PORTABLE CHEST 1 VIEW COMPARISON:  Portable exam 0535 hours compared to 04/09/2022 FINDINGS: Tip of endotracheal tube projects 2.8 cm above carina. Orogastric tube extends into stomach. RIGHT jugular central venous catheter tip projects over SVC. Upper normal heart size with prominent hila again identified. Atherosclerotic calcification aorta. Extensive  RIGHT lung infiltrates and to Larena Ohnemus lesser degree LEFT lower lobe infiltrate, increased since previous exam. Persistent LEFT suprahilar opacity likely representing atelectasis. Small bibasilar pleural effusions. No pneumothorax or acute osseous findings. IMPRESSION: Significantly increased BILATERAL pulmonary infiltrates asymmetrically greater on RIGHT, favor multifocal pneumonia over edema. Bibasilar pleural effusions. LEFT upper lobe opacity favoring atelectasis though both hila remain enlarged, mass and adenopathy not excluded; assessment consider CT assessment. Electronically Signed   By: Lavonia Dana M.D.   On: 04/14/2022 08:48   CT ABDOMEN PELVIS WO CONTRAST  Result Date: 04/13/2022 CLINICAL DATA:  Retroperitoneal bleed suspected. EXAM: CT ABDOMEN AND PELVIS WITHOUT CONTRAST TECHNIQUE: Multidetector CT imaging of the abdomen and pelvis was performed following the standard protocol without IV contrast. RADIATION DOSE REDUCTION: This exam was performed according to the departmental dose-optimization program which includes automated exposure control, adjustment of the mA  and/or kV according to patient size and/or use of iterative reconstruction technique. COMPARISON:  11/09/2020 FINDINGS: Lower chest: Bilateral lower lobe collapse/consolidation with small right and small to moderate left pleural effusions. High density material is seen in the parahilar right lung but only on the first 2 images, indeterminate. Hepatobiliary: No suspicious focal abnormality in the liver on this study without intravenous contrast. Gallbladder is surgically absent. No intrahepatic or extrahepatic biliary dilation. Pancreas: No focal mass lesion. No dilatation of the main duct. No intraparenchymal cyst. No peripancreatic edema. Spleen: No splenomegaly. No focal mass lesion. Adrenals/Urinary Tract: No adrenal nodule or mass. Scarring noted upper pole right kidney. Left kidney unremarkable. No evidence for hydroureter. Bladder is  decompressed by Foley catheter. Gas in the bladder lumen is compatible with the instrumentation. Stomach/Bowel: NG tube tip is in the mid stomach. Stomach is moderately distended with fluid and gas. Duodenum is normally positioned as is the ligament of Treitz. No small bowel wall thickening. No small bowel dilatation. The terminal ileum is normal. The appendix is normal. No gross colonic mass. No colonic wall thickening. Vascular/Lymphatic: There is mild atherosclerotic calcification of the abdominal aorta without aneurysm. There is no gastrohepatic or hepatoduodenal ligament lymphadenopathy. No retroperitoneal or mesenteric lymphadenopathy. No pelvic sidewall lymphadenopathy. Reproductive: The uterus is unremarkable.  There is no adnexal mass. Other: No intraperitoneal free fluid. No retroperitoneal hematoma. 16 mm nodular soft tissue lesion subcutaneous fat anterior right abdominal wall likely an injection site. There is diffuse body wall edema in the subcutaneous fat. Musculoskeletal: No worrisome lytic or sclerotic osseous abnormality. IMPRESSION: 1. No retroperitoneal hematoma. No intraperitoneal free fluid. 2. Bilateral lower lobe collapse/consolidation with small right and small to moderate left pleural effusions. 3. High density material is seen in the parahilar right lung but only on the first 2 images, incompletely visualized and of indeterminate significance. The right chest tube is visible on the scout image. 4. Diffuse body wall edema in the subcutaneous fat. 5.  Aortic Atherosclerosis (ICD10-I70.0). Electronically Signed   By: Misty Stanley M.D.   On: 04/13/2022 16:49   US RENAL  Result Date: 04/13/2022 CLINICAL DATA:  Acute kidney injury, history type II diabetes mellitus, hypertension EXAM: RENAL / URINARY TRACT ULTRASOUND COMPLETE COMPARISON:  None Available. FINDINGS: Right Kidney: Renal measurements: 12.7 x 5.5 x 7.0 cm = volume: 245 mL. Normal cortical thickness and echogenicity. No mass,  hydronephrosis or shadowing calcification. 11.2 x 6.1 x 7.7 cm Left Kidney: Renal measurements: 275 = volume: Normal cortical thickness and echogenicity. No mass, hydronephrosis or shadowing calcification. mL. Echogenicity within normal limits. No mass or hydronephrosis visualized. Bladder: Appears normal for degree of bladder distention. Foley catheter within urinary bladder. Other: Small BILATERAL pleural effusions. IMPRESSION: No renal sonographic abnormalities. Small BILATERAL pleural effusions. Electronically Signed   By: Lavonia Dana M.D.   On: 04/13/2022 12:53   MR BRAIN WO CONTRAST  Result Date: 04/11/2022 CLINICAL DATA:  Neuro deficit, acute, stroke suspected. Mental status change of unknown cause. EXAM: MRI HEAD WITHOUT CONTRAST TECHNIQUE: Multiplanar, multiecho pulse sequences of the brain and surrounding structures were obtained without intravenous contrast. COMPARISON:  CT head without contrast 04/09/2022 FINDINGS: Brain: No acute infarct, hemorrhage, or mass lesion is present. Moderate atrophy and white matter changes are stable. Deep brain nuclei are within normal limits. The ventricles are proportionate to the degree of atrophy. No significant extraaxial fluid collection is present. The internal auditory canals are within normal limits. The brainstem and cerebellum are within normal limits. Vascular:  Flow is present in the major intracranial arteries. Skull and upper cervical spine: The craniocervical junction is normal. Upper cervical spine is within normal limits. Marrow signal is unremarkable. Sinuses/Orbits: Bilateral exophthalmos is stable. Globes and orbits are otherwise within normal limits. Left greater than right mastoid effusions are present. Fluid is present the nasopharynx secondary to intubation. Mild mucosal thickening is present in ethmoid air cells and sphenoid sinuses. IMPRESSION: 1. No acute intracranial abnormality or significant interval change. 2. Stable moderate atrophy and  white matter disease. This likely reflects the sequela of chronic microvascular ischemia. Electronically Signed   By: San Morelle M.D.   On: 04/11/2022 18:26   Overnight EEG with video  Result Date: 04/10/2022 Lora Havens, MD     04/11/2022  9:44 AM Patient Name: TKEYA STENCIL MRN: [id number] Epilepsy Attending: Lora Havens Referring Physician/Provider: Derek [name], MD Duration: 04/09/2022 1704 to 1747, 04/10/2022 0108 to 04/11/2022 0100 Patient history: 71 y.o. female with Carlyle Achenbach history of seizures had Abrea Henle GTC seizure followed by cardiac arrest.  EEG to evaluate for seizures. Level of alertness: comatose AEDs during EEG study: Propofol, LEV Technical aspects: This EEG study was done with scalp electrodes positioned according to the 10-20 International system of electrode placement. Electrical activity was reviewed with band pass filter of 1-_0 , sensitivity of 7 uV/mm, display speed of 26m/sec with Media Pizzini _1  notched filter applied as appropriate. EEG data were recorded continuously and digitally stored.  Video monitoring was available and reviewed as appropriate. Description: At the beginning of study, EEG showed predominantly low amplitude 2-_2  delta slowing admixed with 12-_3  beta activity. EEG was reactive to stimulation. Gradually EEG showed 2-3 seconds of generalized 12-_4  beta activity admixed with 2-_5  delta slowing alternating with 8-10 seconds of  low amplitude 12-_6  beta activity. Hyperventilation and photic stimulation were not performed.   Of note, parts of study were difficult to interpret due to significant electrode artifact. EEG was disconnected between 04/09/2022 1747 to 04/10/2022 0108 for unclear reason. ABNORMALITY - Continuous slow, generalized IMPRESSION: This study is suggestive of severe diffuse encephalopathy, nonspecific etiology. No seizures or epileptiform discharges were seen throughout the recording. PLora Havens  DG Chest Port 1 View  Result Date:  04/09/2022 CLINICAL DATA:  Central line placement EXAM: PORTABLE CHEST 1 VIEW COMPARISON:  04/12/2022 1140 hours FINDINGS: Endotracheal tube terminates 4.5 cm above the carina. Enteric tube courses into the stomach. Right IJ venous catheter terminates at the cavoatrial junction. Hazy opacities in the left upper lobe and bilateral lower lobes, new/progressive, suggesting mild aspiration. No pleural effusion or pneumothorax. Heart is normal in size. Defibrillator pads overlying the left hemithorax. IMPRESSION: Right IJ venous catheter terminates at the cavoatrial junction. No pneumothorax. New/progressive pulmonary opacities, possibly reflecting mild aspiration. Electronically Signed   By: SJulian HyM.D.   On: 04/09/2022 23:19   ECHOCARDIOGRAM COMPLETE  Result Date: 04/09/2022    ECHOCARDIOGRAM REPORT   Patient Name:   LAJAYA CRUTCHFIELDDate of Exam: 04/09/2022 Medical Rec #:  [id number]   Height:       63.0 in Accession #:    [id number]  Weight:       165.3 lb Date of Birth:  [birth year]   BSA:          1.783 m Patient Age:    89years     BP:           121/72 mmHg Patient Gender: F  HR:           114 bpm. Exam Location:  Inpatient Procedure: 2D Echo            REPORT CONTAINS CRITICAL RESULT  Results communicated to Dr Tacy Learn at 18:15 on 04/09/22. Indications:    cardiac arrest  History:        Patient has no prior history of Echocardiogram examinations.                 Risk Factors:Diabetes, Hypertension and Dyslipidemia.  Sonographer:    Johny Chess RDCS Referring Phys: [id number] Silvestre Moment [facility]  Sonographer Comments: Echo performed with patient supine and on artificial respirator. IMPRESSIONS  1. There appears to be Gaven Eugene mobile echodensity in the RV outflow tract (images 45-51), not well visualized but given presentation with cardiac arrest, concerning for thrombus and recommend PE evaluation. RV function appears mildly reduced with free wall hypokinesis  2. Left ventricular ejection fraction, by  estimation, is 65 to 70%. The left ventricle has normal function. The left ventricle has no regional wall motion abnormalities. There is mild left ventricular hypertrophy. Left ventricular diastolic parameters are consistent with Grade I diastolic dysfunction (impaired relaxation).  3. Right ventricular systolic function is mildly reduced. The right ventricular size is normal. There is mildly elevated pulmonary artery systolic pressure.  4. The mitral valve is normal in structure. Trivial mitral valve regurgitation.  5. The aortic valve is tricuspid. Aortic valve regurgitation is not visualized. No aortic stenosis is present.  6. The inferior vena cava is normal in size with greater than 50% respiratory variability, suggesting right atrial pressure of 3 mmHg. FINDINGS  Left Ventricle: Left ventricular ejection fraction, by estimation, is 65 to 70%. The left ventricle has normal function. The left ventricle has no regional wall motion abnormalities. The left ventricular internal cavity size was small. There is mild left ventricular hypertrophy. Left ventricular diastolic parameters are consistent with Grade I diastolic dysfunction (impaired relaxation). Right Ventricle: The right ventricular size is normal. No increase in right ventricular wall thickness. Right ventricular systolic function is mildly reduced. There is mildly elevated pulmonary artery systolic pressure. The tricuspid regurgitant velocity  is 2.85 m/s, and with an assumed right atrial pressure of 3 mmHg, the estimated right ventricular systolic pressure is 11.9 mmHg. Left Atrium: Left atrial size was not well visualized. Right Atrium: Right atrial size was normal in size. Pericardium: Trivial pericardial effusion is present. Mitral Valve: The mitral valve is normal in structure. Trivial mitral valve regurgitation. Tricuspid Valve: The tricuspid valve is normal in structure. Tricuspid valve regurgitation is trivial. Aortic Valve: The aortic valve is  tricuspid. Aortic valve regurgitation is not visualized. No aortic stenosis is present. Pulmonic Valve: The pulmonic valve was not well visualized. Pulmonic valve regurgitation is not visualized. Aorta: The aortic root and ascending aorta are structurally normal, with no evidence of dilitation. Venous: The inferior vena cava is normal in size with greater than 50% respiratory variability, suggesting right atrial pressure of 3 mmHg. IAS/Shunts: The interatrial septum was not well visualized.  LEFT VENTRICLE PLAX 2D LVIDd:         3.20 cm   Diastology LVIDs:         2.20 cm   LV e' medial:    6.31 cm/s LV PW:         0.90 cm   LV E/e' medial:  8.9 LV IVS:        1.00 cm   LV e' lateral:  7.07 cm/s LVOT diam:     1.90 cm   LV E/e' lateral: 7.9 LV SV:         34 LV SV Index:   19 LVOT Area:     2.84 cm  RIGHT VENTRICLE             IVC RV S prime:     15.70 cm/s  IVC diam: 1.10 cm TAPSE (M-mode): 1.0 cm LEFT ATRIUM         Index       RIGHT ATRIUM          Index LA diam:    1.80 cm 1.01 cm/m  RA Area:     8.39 cm                                 RA Volume:   15.60 ml 8.75 ml/m  AORTIC VALVE LVOT Vmax:   114.00 cm/s LVOT Vmean:  73.100 cm/s LVOT VTI:    0.121 m  AORTA Ao Root diam: 2.90 cm Ao Asc diam:  2.80 cm MV E velocity: 56.20 cm/s   TRICUSPID VALVE MV Carl Bleecker velocity: 105.00 cm/s  TR Peak grad:   32.5 mmHg MV E/Carma Dwiggins ratio:  0.54         TR Vmax:        285.00 cm/s                              SHUNTS                             Systemic VTI:  0.12 m                             Systemic Diam: 1.90 cm Oswaldo Milian MD Electronically signed by Oswaldo Milian MD Signature Date/Time: 04/09/2022/6:28:43 PM    Final    EEG adult  Result Date: 04/09/2022 Derek [name], MD     04/09/2022  6:01 PM Routine EEG Report TATANISHA CUTHBERT is Joniah Bednarski 71 y.o. female with Makinzee Durley history of seizures who is undergoing an EEG to evaluate for seizures. Report: This EEG was acquired with electrodes placed according to the International  10-20 electrode system (including Fp1, Fp2, F3, F4, C3, C4, P3, P4, O1, O2, T3, T4, T5, T6, A1, A2, Fz, Cz, Pz). The following electrodes were missing or displaced: none. The background initially consisted of diffuse suppression with intermittent brief runs of low amplitude beta frequencies. Approximately halfway through the recording this transitioned to Taleya Whitcher burst suppression pattern with bursts consisting of low amplitude semirhythmic theta with overriding beta frequencies lasting 4-5 seconds alternating with periods of diffuse suppression lasting 10-15 seconds. This activity is reactive to stimulation. No sleep architecture was identified. There was no focal slowing. There were no interictal epileptiform discharges. There were no electrographic seizures identified. Photic stimulation and hyperventilation were not performed. Impression and clinical correlation: This EEG was obtained while comatose and is abnormal due to: -  burst suppression pattern in Floyde Dingley patient not on sedation, indicative of severe global cerebral dysfunction. The bursts consist of low amplitude semirhythmic theta with overriding beta and do not appear epileptiform. There were no definitive epileptiform abnormalities in this recording. This study will be followed by LTM monitoring for further evaluation. [employer]  Quinn Axe, MD [facility] [phone] If 7pm- 7am, please page neurology on call as listed in [facility].   CT Head Wo Contrast  Result Date: 04/09/2022 CLINICAL DATA:  Mental status change, unknown cause EXAM: CT HEAD WITHOUT CONTRAST TECHNIQUE: Contiguous axial images were obtained from the base of the skull through the vertex without intravenous contrast. RADIATION DOSE REDUCTION: This exam was performed according to the departmental dose-optimization program which includes automated exposure control, adjustment of the mA and/or kV according to patient size and/or use of iterative reconstruction technique. COMPARISON:   04/23/2021 FINDINGS: Brain: No evidence of acute infarction, hemorrhage, hydrocephalus, extra-axial collection or mass lesion/mass effect. Scattered low-density changes within the periventricular and subcortical white matter compatible with chronic microvascular ischemic change. Mild diffuse cerebral volume loss. Vascular: Atherosclerotic calcifications involving the large vessels of the skull base. No unexpected hyperdense vessel. Skull: Normal. Negative for fracture or focal lesion. Sinuses/Orbits: No acute finding. Other: None. IMPRESSION: 1. No acute intracranial findings. 2. Chronic microvascular ischemic change and cerebral volume loss. Electronically Signed   By: Davina Poke D.O.   On: 04/09/2022 12:24   DG Chest Portable 1 View  Result Date: 04/09/2022 CLINICAL DATA:  Chest pain EXAM: PORTABLE CHEST 1 VIEW COMPARISON:  11/09/2020 FINDINGS: Endotracheal tube terminates 2.1 cm above the carina. Enteric tube extends into the stomach. Heart size is upper limits of normal. Pulmonary vascular congestion. Chronic scarring/atelectasis within the right middle lobe and medial left upper lobe. No new airspace consolidation. No pleural effusion or pneumothorax. IMPRESSION: 1. Appropriately positioned endotracheal and enteric tubes. 2. Pulmonary vascular congestion. 3. Chronic scarring/atelectasis within the right middle lobe and medial left upper lobe. Electronically Signed   By: Davina Poke D.O.   On: 04/09/2022 12:11    Microbiology: Recent Results (from the past 240 hour(s))  Culture, Respiratory w Gram Stain     Status: None   Collection Time: 04/16/22  9:10 AM   Specimen: Tracheal Aspirate; Respiratory  Result Value Ref Range Status   Specimen Description TRACHEAL ASPIRATE  Final   Special Requests NONE  Final   Gram Stain FEW WBC PRESENT, PREDOMINANTLY PMN RARE YEAST   Final   Culture   Final    FEW SERRATIA MARCESCENS FEW CANDIDA ALBICANS No Pseudomonas species isolated NO  STAPHYLOCOCCUS AUREUS ISOLATED Performed at [facility], [address] [place], [place] [address]    Report Status 04/18/2022 FINAL  Final   Organism ID, Bacteria SERRATIA MARCESCENS  Final      Susceptibility   Serratia marcescens - MIC*    CEFAZOLIN >=64 RESISTANT Resistant     CEFEPIME <=0.12 SENSITIVE Sensitive     CEFTAZIDIME <=1 SENSITIVE Sensitive     CEFTRIAXONE <=0.25 SENSITIVE Sensitive     CIPROFLOXACIN <=0.25 SENSITIVE Sensitive     GENTAMICIN <=1 SENSITIVE Sensitive     TRIMETH/SULFA <=20 SENSITIVE Sensitive     * FEW SERRATIA MARCESCENS  Resp panel by RT-PCR (RSV, Flu Louna Rothgeb&B, Covid) Anterior Nasal Swab     Status: None   Collection Time: 04/24/22 12:40 PM   Specimen: Anterior Nasal Swab  Result Value Ref Range Status   SARS Coronavirus 2 by RT PCR NEGATIVE NEGATIVE Final    Comment: (NOTE) SARS-CoV-2 target nucleic acids are NOT DETECTED.  The SARS-CoV-2 RNA is generally detectable in upper respiratory specimens during the acute phase of infection. The lowest concentration of SARS-CoV-2 viral copies this assay can detect is 138 copies/mL. Skylarr Liz negative result does not preclude SARS-Cov-2 infection  and should not be used as the sole basis for treatment or other patient management decisions. Dempsey Knotek negative result may occur with  improper specimen collection/handling, submission of specimen other than nasopharyngeal swab, presence of viral mutation(s) within the areas targeted by this assay, and inadequate number of viral copies(<138 copies/mL). Tiandre Teall negative result must be combined with clinical observations, patient history, and epidemiological information. The expected result is Negative.  Fact Sheet for Patients:  EntrepreneurPulse.com.au  Fact Sheet for Healthcare Providers:  IncredibleEmployment.be  This test is no t yet approved or cleared by the [place] FDA and  has been authorized for detection and/or diagnosis of  SARS-CoV-2 by FDA under an Emergency Use Authorization (EUA). This EUA will remain  in effect (meaning this test can be used) for the duration of the COVID-19 declaration under Section 564(b)(1) of the Act, 21 U.S.C.section 360bbb-3(b)(1), unless the authorization is terminated  or revoked sooner.       Influenza Shakima Nisley by PCR NEGATIVE NEGATIVE Final   Influenza B by PCR NEGATIVE NEGATIVE Final    Comment: (NOTE) The Xpert Xpress SARS-CoV-2/FLU/RSV plus assay is intended as an aid in the diagnosis of influenza from Nasopharyngeal swab specimens and should not be used as Adelyne Marchese sole basis for treatment. Nasal washings and aspirates are unacceptable for Xpert Xpress SARS-CoV-2/FLU/RSV testing.  Fact Sheet for Patients: EntrepreneurPulse.com.au  Fact Sheet for Healthcare Providers: IncredibleEmployment.be  This test is not yet approved or cleared by the [place] FDA and has been authorized for detection and/or diagnosis of SARS-CoV-2 by FDA under an Emergency Use Authorization (EUA). This EUA will remain in effect (meaning this test can be used) for the duration of the COVID-19 declaration under Section 564(b)(1) of the Act, 21 U.S.C. section 360bbb-3(b)(1), unless the authorization is terminated or revoked.     Resp Syncytial Virus by PCR NEGATIVE NEGATIVE Final    Comment: (NOTE) Fact Sheet for Patients: EntrepreneurPulse.com.au  Fact Sheet for Healthcare Providers: IncredibleEmployment.be  This test is not yet approved or cleared by the [place] FDA and has been authorized for detection and/or diagnosis of SARS-CoV-2 by FDA under an Emergency Use Authorization (EUA). This EUA will remain in effect (meaning this test can be used) for the duration of the COVID-19 declaration under Section 564(b)(1) of the Act, 21 U.S.C. section 360bbb-3(b)(1), unless the authorization is terminated  or revoked.  Performed at [facility], [place] [address], [place], [place] [address]   Respiratory (~20 pathogens) panel by PCR     Status: None   Collection Time: 04/24/22 12:40 PM   Specimen: Nasopharyngeal Swab; Respiratory  Result Value Ref Range Status   Adenovirus NOT DETECTED NOT DETECTED Final   Coronavirus 229E NOT DETECTED NOT DETECTED Final    Comment: (NOTE) The Coronavirus on the Respiratory Panel, DOES NOT test for the novel  Coronavirus (2019 nCoV)    Coronavirus HKU1 NOT DETECTED NOT DETECTED Final   Coronavirus NL63 NOT DETECTED NOT DETECTED Final   Coronavirus OC43 NOT DETECTED NOT DETECTED Final   Metapneumovirus NOT DETECTED NOT DETECTED Final   Rhinovirus / Enterovirus NOT DETECTED NOT DETECTED Final   Influenza Qiara Minetti NOT DETECTED NOT DETECTED Final   Influenza B NOT DETECTED NOT DETECTED Final   Parainfluenza Virus 1 NOT DETECTED NOT DETECTED Final   Parainfluenza Virus 2 NOT DETECTED NOT DETECTED Final   Parainfluenza Virus 3 NOT DETECTED NOT DETECTED Final   Parainfluenza Virus 4 NOT DETECTED NOT DETECTED Final   Respiratory Syncytial Virus  NOT DETECTED NOT DETECTED Final   Bordetella pertussis NOT DETECTED NOT DETECTED Final   Bordetella Parapertussis NOT DETECTED NOT DETECTED Final   Chlamydophila pneumoniae NOT DETECTED NOT DETECTED Final   Mycoplasma pneumoniae NOT DETECTED NOT DETECTED Final    Comment: Performed at [facility], [place] [address], [place], [place] [address]     Labs: Basic Metabolic Panel: Recent Labs  Lab 04/21/22 0529 04/22/22 0805 04/23/22 0436 04/24/22 0937 04/25/22 0123  NA 141 142 140 141 139  K 3.1* 3.5 3.8 3.1* 3.4*  CL 103 104 102 98 98  CO2 _0 GLUCOSE 297* 352* 333* 233* 207*  BUN _1 CREATININE 1.43* 1.20* 1.08* 1.06* 1.01*  CALCIUM 8.5* 8.5* 8.6* 8.9 8.4*  MG 1.8 1.8 1.7 1.4* 2.2  PHOS  --   --  2.2*  --   --    Liver Function Tests: Recent Labs  Lab 04/21/22 1222  04/23/22 0436  AST  --  29  ALT  --  50*  ALKPHOS  --  119  BILITOT  --  0.4  PROT  --  5.9*  ALBUMIN 2.6* 2.3*   No results for input(s): "LIPASE", "AMYLASE" in the last 168 hours. No results for input(s): "AMMONIA" in the last 168 hours. CBC: Recent Labs  Lab 04/19/22 0510 04/19/22 1424 04/20/22 0245 04/20/22 1222 04/21/22 0529 04/21/22 1221 04/22/22 0805 04/23/22 0436 04/24/22 0937 04/25/22 0123  WBC 9.4 14.1* 10.7*   < > 8.5  --  9.3 7.4 7.6 7.5  NEUTROABS 7.4 11.6* 8.4*  --   --   --   --  5.3  --   --   HGB 7.3* 9.3* 7.6*   < > 7.3* 8.5* 7.9* 7.6* 8.5* 7.6*  HCT 23.3* 29.0* 24.1*   < > 24.5* 28.0* 24.9* 24.6* 28.2* 24.9*  MCV 74.7* 74.0* 74.4*   < > 75.9*  --  74.8* 76.2* 76.0* 75.9*  PLT 220 286 264   < > 278  --  297 217 310 313   < > = values in this interval not displayed.   Cardiac Enzymes: No results for input(s): "CKTOTAL", "CKMB", "CKMBINDEX", "TROPONINI" in the last 168 hours. BNP: BNP (last 3 results) Recent Labs    04/09/22 1100  BNP 153.2*    ProBNP (last 3 results) No results for input(s): "PROBNP" in the last 8760 hours.  CBG: Recent Labs  Lab 04/24/22 1146 04/24/22 1632 04/25/22 0024 04/25/22 0736 04/25/22 1138  GLUCAP 150* 147* 184* 230* 260*       Signed:  Fayrene Helper MD.  [facility] 04/25/2022, 12:11 PM

## 2022-04-25 NOTE — Plan of Care (Signed)
  Problem: Education: Goal: Ability to manage disease process will improve Outcome: Progressing   Problem: Cardiac: Goal: Ability to achieve and maintain adequate cardiopulmonary perfusion will improve Outcome: Progressing   Problem: Neurologic: Goal: Promote progressive neurologic recovery Outcome: Progressing   Problem: Skin Integrity: Goal: Risk for impaired skin integrity will be minimized. Outcome: Progressing   Problem: Education: Goal: Ability to describe self-care measures that may prevent or decrease complications (Diabetes Survival Skills Education) will improve Outcome: Progressing Goal: Individualized Educational Video(s) Outcome: Progressing   Problem: Coping: Goal: Ability to adjust to condition or change in health will improve Outcome: Progressing   Problem: Fluid Volume: Goal: Ability to maintain a balanced intake and output will improve Outcome: Progressing   Problem: Health Behavior/Discharge Planning: Goal: Ability to identify and utilize available resources and services will improve Outcome: Progressing Goal: Ability to manage health-related needs will improve Outcome: Progressing   Problem: Metabolic: Goal: Ability to maintain appropriate glucose levels will improve Outcome: Progressing   Problem: Nutritional: Goal: Maintenance of adequate nutrition will improve Outcome: Progressing Goal: Progress toward achieving an optimal weight will improve Outcome: Progressing   Problem: Skin Integrity: Goal: Risk for impaired skin integrity will decrease Outcome: Progressing   Problem: Tissue Perfusion: Goal: Adequacy of tissue perfusion will improve Outcome: Progressing   Problem: Education: Goal: Ability to describe self-care measures that may prevent or decrease complications (Diabetes Survival Skills Education) will improve Outcome: Progressing Goal: Individualized Educational Video(s) Outcome: Progressing   Problem: Cardiac: Goal: Ability to  maintain an adequate cardiac output will improve Outcome: Progressing   Problem: Health Behavior/Discharge Planning: Goal: Ability to identify and utilize available resources and services will improve Outcome: Progressing Goal: Ability to manage health-related needs will improve Outcome: Progressing   Problem: Fluid Volume: Goal: Ability to achieve a balanced intake and output will improve Outcome: Progressing   Problem: Metabolic: Goal: Ability to maintain appropriate glucose levels will improve Outcome: Progressing   Problem: Nutritional: Goal: Maintenance of adequate nutrition will improve Outcome: Progressing Goal: Maintenance of adequate weight for body size and type will improve Outcome: Progressing   Problem: Respiratory: Goal: Will regain and/or maintain adequate ventilation Outcome: Progressing   Problem: Urinary Elimination: Goal: Ability to achieve and maintain adequate renal perfusion and functioning will improve Outcome: Progressing   Problem: Education: Goal: Knowledge of General Education information will improve Description: Including pain rating scale, medication(s)/side effects and non-pharmacologic comfort measures Outcome: Progressing   Problem: Health Behavior/Discharge Planning: Goal: Ability to manage health-related needs will improve Outcome: Progressing   Problem: Clinical Measurements: Goal: Ability to maintain clinical measurements within normal limits will improve Outcome: Progressing Goal: Will remain free from infection Outcome: Progressing Goal: Diagnostic test results will improve Outcome: Progressing Goal: Respiratory complications will improve Outcome: Progressing Goal: Cardiovascular complication will be avoided Outcome: Progressing   Problem: Activity: Goal: Risk for activity intolerance will decrease Outcome: Progressing   Problem: Nutrition: Goal: Adequate nutrition will be maintained Outcome: Progressing   Problem:  Coping: Goal: Level of anxiety will decrease Outcome: Progressing   Problem: Elimination: Goal: Will not experience complications related to bowel motility Outcome: Progressing Goal: Will not experience complications related to urinary retention Outcome: Progressing   Problem: Pain Managment: Goal: General experience of comfort will improve Outcome: Progressing   Problem: Safety: Goal: Ability to remain free from injury will improve Outcome: Progressing   Problem: Skin Integrity: Goal: Risk for impaired skin integrity will decrease Outcome: Progressing

## 2022-04-25 NOTE — TOC Transition Note (Signed)
Transition of Care [facility]) - CM/SW Discharge Note   Patient Details  Name: Deborah Neal MRN: [id number] Date of Birth: [birth year]  [facility] CM/SW Contact:  Bartholomew Crews, RN Phone Number: [phone] 04/25/2022, 1:44 PM   Clinical Narrative:     Notified by CIR liaison that patient to transition to CIR today. MD in agreement. No further TOC needs identified at this time.   Final next level of care: IP Rehab Facility Barriers to Discharge: No Barriers Identified   Patient Goals and CMS Choice      Discharge Placement                         Discharge Plan and Services Additional resources added to the After Visit Summary for   In-house Referral: NA Discharge Planning Services: CM Consult Post Acute Care Choice: IP Rehab            DME Agency: NA                  Social Determinants of Health (SDOH) Interventions SDOH Screenings   Depression (PHQ2-9): Low Risk  (01/22/2022)  Tobacco Use: Medium Risk (01/22/2022)     Readmission Risk Interventions     No data to display

## 2022-04-25 NOTE — H&P (Signed)
Physical Medicine and Rehabilitation Admission H&P     CC: Debility secondary to cardiac arrest and pneumonia   HPI: Deborah Neal is a 71 year old female whose family attempted to contact her on the phone morning of 04/09/2022.  When she did not answer they found her at home unresponsive.  This was called and upon arrival she lost her pulse and CPR was initiated.  Demonstrated seizure-like activity on route to emergency department which improved with benzodiazepine administration.  She was intubated and admitted to pulmonary critical care medicine.  Laboratory workup significant for lactic acidosis, acute kidney injury, elevated troponins and a hemoglobin A1c of greater than 15.5%.  Unable to do CTA of the chest due to kidney function.  She was initially started on heparin infusion due to concern for pulmonary embolism.  She subsequently became hypotensive and required Levophed then went into cardiac arrest with ventricular tachycardia/ventricular fibrillation.  ROSC was achieved after 2 minutes.  She was treated with tenecteplase.  Extremity ultrasound negative for DVT.  She was treated for acute hypoxic respiratory failure with multifocal pneumonia secondary to Serratia marcescens.  After renal recovery, she underwent CT angiogram which showed an intraluminal filling defect and a small subsegmental branch in the right lower lobe suggesting pulmonary embolism.  She was extubated on 12/23.  She is currently receiving cefepime IV which is to be completed 12/29.  Lasix continues for diffuse body wall edema.  Her EEG was suggestive of severe to profound diffuse encephalopathy and MRI of the brain was without acute intracranial abnormality.  On Keppra, discontinued on 12/29. Diabetic coordinator consulted for insulin management.  Per medication administration record, patient started on Eliquis and heparin infusion discontinued 12/29. Cardiology consulted on 12/29 and echocardiogram performed. EF estimated at  60-65%.  The patient requires inpatient physical medicine and rehabilitation evaluations and treatment secondary to dysfunction due to ability secondary to cardiac arrest, pneumonia.   Pt reports some sore hands from BG checks; hate getting BG checked A little dizzy/wobbly when stands up LBM this AM Peeing a lot Feels like cognition is working well/brain "working well"- daughter [name] agrees.    Peeing a lot, so using purewick No N/V/abd pain.      Review of Systems  Constitutional:  Negative for chills, fever, malaise/fatigue and weight loss.  HENT: Negative.    Eyes: Negative.   Respiratory:  Negative for sputum production, shortness of breath and wheezing.   Cardiovascular:  Negative for chest pain, palpitations, orthopnea and claudication.  Gastrointestinal:  Negative for abdominal pain, constipation, heartburn, nausea and vomiting.  Genitourinary:  Negative for dysuria, hematuria and urgency.  Musculoskeletal:  Negative for joint pain and myalgias.       Rib/chest wall pain  Skin:  Positive for rash. Negative for itching.  Neurological:  Positive for dizziness and weakness. Negative for tremors, sensory change and headaches.  Endo/Heme/Allergies: Negative.   Psychiatric/Behavioral:  Negative for depression, memory loss and suicidal ideas. The patient is not nervous/anxious and does not have insomnia.         Past Medical History:  Diagnosis Date   ALLERGIC RHINITIS 01/29/2007   Arthritis     ASTHMA 01/29/2007   ASTHMA, WITH ACUTE EXACERBATION 06/24/2008   BACK PAIN 01/29/2009   CHEST PAIN 06/24/2008   Family history of adverse reaction to anesthesia      Patients daughter has N/V after anesthesia   GERD (gastroesophageal reflux disease)     History of shingles     HYPERLIPIDEMIA  01/29/2007   HYPERTENSION 01/29/2007   OSTEOPENIA 10/16/2007   Type II or unspecified type diabetes mellitus without mention of complication, uncontrolled 11/29/2013         Past Surgical History:   Procedure Laterality Date   CHOLECYSTECTOMY N/A 09/19/2015    Procedure: LAPAROSCOPIC CHOLECYSTECTOMY WITH INTRAOPERATIVE CHOLANGIOGRAM;  Surgeon: Jackolyn Confer, MD;  Location: [facility];  Service: General;  Laterality: N/A;   COLONOSCOPY       TONSILLECTOMY   1956   VIDEO BRONCHOSCOPY Bilateral 03/28/2014    Procedure: VIDEO BRONCHOSCOPY WITHOUT FLUORO;  Surgeon: Tanda Rockers, MD;  Location: [facility];  Service: Cardiopulmonary;  Laterality: Bilateral;         Family History  Problem Relation Age of Onset   Cancer Mother          ? type    Diabetes Father     Hypertension Father     Heart disease Father     Asthma Father     Asthma Daughter     Colon cancer Neg Hx     Esophageal cancer Neg Hx     Rectal cancer Neg Hx     Stomach cancer Neg Hx     Breast cancer Neg Hx      Social History:  reports that she quit smoking about 18 years ago. Her smoking use included cigarettes. She has never used smokeless tobacco. She reports that she does not drink alcohol and does not use drugs. Allergies:       Allergies  Allergen Reactions   Shingrix [Zoster Vac Recomb Adjuvanted]        Allergy to original live zoster - rash          Medications Prior to Admission  Medication Sig Dispense Refill   albuterol (PROAIR HFA) 108 (90 Base) MCG/ACT inhaler INHALE 2 PUFFS BY MOUTH EVERY 6 HOURS AS NEEDED FOR WHEEZING OR SHORTNESS OF BREATH (Patient taking differently: Inhale 2 puffs into the lungs every 6 (six) hours as needed for wheezing or shortness of breath.) 8.5 g 5   aspirin 81 MG tablet Take 81 mg by mouth every morning.       empagliflozin (JARDIANCE) 25 MG TABS tablet Take 1 tablet (25 mg total) by mouth daily. 90 tablet 3   insulin glargine, 2 Unit Dial, (TOUJEO MAX SOLOSTAR) 300 UNIT/ML Solostar Pen ADMINISTER 100 UNITS UNDER THE SKIN DAILY BEFORE AND SUPPER 6 mL 5   insulin regular human CONCENTRATED (HUMULIN R U-500 KWIKPEN) 500 UNIT/ML KwikPen ADMINISTER 60 UNITS UNDER THE SKIN 30  MINUTES BEFORE MEALS (Patient taking differently: Inject 60 Units into the skin 3 (three) times daily with meals.) 6 mL 3   losartan (COZAAR) 100 MG tablet Take 1 tablet (100 mg total) by mouth daily. 90 tablet 3   Multiple Vitamin (MULTIVITAMIN WITH MINERALS) TABS tablet Take 1 tablet by mouth daily.       potassium chloride (KLOR-CON) 10 MEQ tablet 3 tab by mouth once daily 270 tablet 3   rosuvastatin (CRESTOR) 20 MG tablet 1 tab by mouth once daily 90 tablet 3   Blood Glucose Monitoring Suppl (ONE TOUCH ULTRA 2) w/Device KIT Use as directed 1 each 0   Lancets MISC Use as directed four times daily  E11.9 400 each 11   ONETOUCH VERIO test strip USE AS DIRECTED THREE TIMES DAILY 100 strip 4   ONETOUCH VERIO test strip USE TO CHECK BLOOD SUGARS THREE TIMES DAILY 300 strip 3   tiZANidine (ZANAFLEX)  2 MG tablet TAKE 1 TABLET(2 MG) BY MOUTH EVERY 6 HOURS AS NEEDED FOR MUSCLE SPASMS (Patient taking differently: Take 2 mg by mouth every 6 (six) hours as needed for muscle spasms.) 30 tablet 1          Home: Home Living Family/patient expects to be discharged to:: Private residence Living Arrangements:  (grand daughter, Jordan Hawks, lives with patient. Margaret's daughter is [place]) Available Help at Discharge: Family, Available 24 hours/day Joycelyn Schmid and [place] to provide 24/7 care) Type of Home: House Home Access: Stairs to enter [employer] of Steps: 4 Entrance Stairs-Rails: Right Home Layout: One level Bathroom Shower/Tub: Chiropodist: Standard Bathroom Accessibility: Yes Home Equipment: None Additional Comments: Ive explained  to family that patient does not have medicaid  to provide PCS services; Joycelyn Schmid to reapply  Lives With: Other (Comment) (granddaughter, [place], lives with patient)   Functional History: Prior Function Prior Level of Function : Independent/Modified Independent Mobility Comments: reports no AD use ADLs Comments: cooks and cleans,  does not drive   Functional Status:  Mobility: Bed Mobility Overal bed mobility: Needs Assistance Bed Mobility: Supine to Sit Supine to sit: HOB elevated, Min assist Sit to supine: Min assist General bed mobility comments: increased time and effort required Transfers Overall transfer level: Needs assistance Equipment used: Rolling walker (2 wheels) Transfers: Sit to/from Stand Sit to Stand: Min assist Bed to/from chair/wheelchair/BSC transfer type:: Stand pivot Stand pivot transfers: Mod assist General transfer comment: lifting assistance required to stand Ambulation/Gait Ambulation/Gait assistance: Min assist Gait Distance (Feet): 75 Feet Assistive device: Rolling walker (2 wheels) Gait Pattern/deviations: Trunk flexed, Decreased stride length General Gait Details: decreased foot clearance bilaterally. cues to increase step length/height and to stand closer to rolling walker for support. steadying assistance required with ambulation as well as occasional assistance for rolling walker negotiation, expecially around obstacles and with turns. Sp02 remained in the upper 90's with ambulation on 3 L02. activity tolerance limited by fatigue and one standing rest break required Gait velocity: decreased Gait velocity interpretation: <1.31 ft/sec, indicative of household ambulator Pre-gait activities: marching in place   ADL: ADL Overall ADL's : Needs assistance/impaired Eating/Feeding: Set up, Sitting Grooming: Min guard, Standing Upper Body Bathing: Moderate assistance Lower Body Bathing: Moderate assistance Upper Body Dressing : Minimal assistance Lower Body Dressing: Moderate assistance Toilet Transfer: Min guard, Rolling walker (2 wheels), Ambulation, Regular Toilet Toileting- Clothing Manipulation and Hygiene: Maximal assistance Toileting - Clothing Manipulation Details (indicate cue type and reason): pericare Functional mobility during ADLs: Min guard, Rolling walker (2  wheels)   Cognition: Cognition Overall Cognitive Status: Within Functional Limits for tasks assessed Orientation Level: Oriented X4 Cognition Arousal/Alertness: Awake/alert Behavior During Therapy: Flat affect Overall Cognitive Status: Within Functional Limits for tasks assessed General Comments: slow to respond at times and needs cues for safety with mobility tasks. slow initiation as well   Physical Exam: Blood pressure 133/69, pulse 93, temperature 98.3 F (36.8 C), temperature source Oral, resp. rate 18, height _0  (1.6 m), weight 77.7 kg, SpO2 100 %. Physical Exam Vitals and nursing note reviewed. Exam conducted with a chaperone present.  Constitutional:      General: She is not in acute distress.    Appearance: Normal appearance. She is obese. She is not ill-appearing.     Comments: Awake, alert, appropriate, sitting up in bed watching TV, daughter [name] at bedside, NAD  HENT:     Head: Normocephalic and atraumatic.     Comments: No facial droop;  tongue midline- not coated Facial sensation intact ot light touch B/L    Right Ear: External ear normal.     Left Ear: External ear normal.     Nose: Nose normal. No congestion.     Mouth/Throat:     Mouth: Mucous membranes are moist.     Pharynx: Oropharynx is clear. No oropharyngeal exudate.  Eyes:     General:        Right eye: No discharge.        Left eye: No discharge.     Extraocular Movements: Extraocular movements intact.  Neck:     Comments: Some irritation/rash of skin around anterior neck- not open Cardiovascular:     Rate and Rhythm: Normal rate and regular rhythm.     Heart sounds: Normal heart sounds. No murmur heard.    No gallop.  Pulmonary:     Effort: Pulmonary effort is normal. No respiratory distress.     Breath sounds: Normal breath sounds. No wheezing, rhonchi or rales.  Abdominal:     General: Bowel sounds are normal. There is no distension.     Palpations: Abdomen is soft.     Tenderness: There  is no abdominal tenderness.  Musculoskeletal:     Cervical back: Neck supple. No tenderness.     Comments: 5/5 in B/L UE/arms- biceps, triceps, WE, grip and FA 5-/5 in B/L LE's- HF, KE, KF, DF and PF 5/5 B/L  Skin:    General: Skin is warm and dry.     Comments: R forearm IV- looks OK Neck irritation as above No significant LE edema- maybe trace in feet/ankles B/L  Neurological:     Mental Status: She is alert and oriented to person, place, and time.     Comments: Intact to light touch in all 4 extremities Ox3- able to answer 2-3 steps questions and follow 2 step commands   Psychiatric:        Mood and Affect: Mood normal.        Behavior: Behavior normal.        Lab Results Last 48 Hours        Results for orders placed or performed during the hospital encounter of 04/09/22 (from the past 48 hour(s))  Glucose, capillary     Status: Abnormal    Collection Time: 04/21/22  4:03 PM  Result Value Ref Range    Glucose-Capillary 286 (H) 70 - 99 mg/dL      Comment: Glucose reference range applies only to samples taken after fasting for at least 8 hours.  Glucose, capillary     Status: Abnormal    Collection Time: 04/21/22  9:39 PM  Result Value Ref Range    Glucose-Capillary 384 (H) 70 - 99 mg/dL      Comment: Glucose reference range applies only to samples taken after fasting for at least 8 hours.  Heparin level (unfractionated)     Status: Abnormal    Collection Time: 04/21/22 10:38 PM  Result Value Ref Range    Heparin Unfractionated 0.83 (H) 0.30 - 0.70 IU/mL      Comment: (NOTE) The clinical reportable range upper limit is being lowered to >1.10 to align with the FDA approved guidance for the current laboratory assay.   If heparin results are below expected values, and patient dosage has  been confirmed, suggest follow up testing of antithrombin III levels. Performed at [facility], [place] [address] [place], [place] [address]    Glucose, capillary  Status:  Abnormal    Collection Time: 04/22/22  7:30 AM  Result Value Ref Range    Glucose-Capillary 312 (H) 70 - 99 mg/dL      Comment: Glucose reference range applies only to samples taken after fasting for at least 8 hours.  Basic metabolic panel     Status: Abnormal    Collection Time: 04/22/22  8:05 AM  Result Value Ref Range    Sodium 142 135 - 145 mmol/L    Potassium 3.5 3.5 - 5.1 mmol/L    Chloride 104 98 - 111 mmol/L    CO2 29 22 - 32 mmol/L    Glucose, Bld 352 (H) 70 - 99 mg/dL      Comment: Glucose reference range applies only to samples taken after fasting for at least 8 hours.    BUN 23 8 - 23 mg/dL    Creatinine, Ser 1.20 (H) 0.44 - 1.00 mg/dL    Calcium 8.5 (L) 8.9 - 10.3 mg/dL    GFR, Estimated 49 (L) >60 mL/min      Comment: (NOTE) Calculated using the CKD-EPI Creatinine Equation (2021)      Anion gap 9 5 - 15      Comment: Performed at [facility] [address] [place], [place] [address]  CBC     Status: Abnormal    Collection Time: 04/22/22  8:05 AM  Result Value Ref Range    WBC 9.3 4.0 - 10.5 K/uL    RBC 3.33 (L) 3.87 - 5.11 MIL/uL    Hemoglobin 7.9 (L) 12.0 - 15.0 g/dL      Comment: Reticulocyte Hemoglobin testing may be clinically indicated, consider ordering this additional test [id number]      HCT 24.9 (L) 36.0 - 46.0 %    MCV 74.8 (L) 80.0 - 100.0 fL    MCH 23.7 (L) 26.0 - 34.0 pg    MCHC 31.7 30.0 - 36.0 g/dL    RDW 14.9 11.5 - 15.5 %    Platelets 297 150 - 400 K/uL    nRBC 0.0 0.0 - 0.2 %      Comment: Performed at [facility], [place] [address] [place], [place] [address]  Heparin level (unfractionated)     Status: Abnormal    Collection Time: 04/22/22  8:05 AM  Result Value Ref Range    Heparin Unfractionated 0.83 (H) 0.30 - 0.70 IU/mL      Comment: (NOTE) The clinical reportable range upper limit is being lowered to >1.10 to align with the FDA approved guidance for the current laboratory assay.   If heparin results are below  expected values, and patient dosage has  been confirmed, suggest follow up testing of antithrombin III levels. Performed at [facility], [place] [address], [place], [place] [address]    Magnesium     Status: None    Collection Time: 04/22/22  8:05 AM  Result Value Ref Range    Magnesium 1.8 1.7 - 2.4 mg/dL      Comment: Performed at [facility] [address], [place], [place] [address]  Glucose, capillary     Status: Abnormal    Collection Time: 04/22/22 12:24 PM  Result Value Ref Range    Glucose-Capillary 311 (H) 70 - 99 mg/dL      Comment: Glucose reference range applies only to samples taken after fasting for at least 8 hours.  Glucose, capillary     Status: Abnormal  Collection Time: 04/22/22  4:59 PM  Result Value Ref Range    Glucose-Capillary 185 (H) 70 - 99 mg/dL      Comment: Glucose reference range applies only to samples taken after fasting for at least 8 hours.  Heparin level (unfractionated)     Status: None    Collection Time: 04/22/22  7:22 PM  Result Value Ref Range    Heparin Unfractionated 0.65 0.30 - 0.70 IU/mL      Comment: (NOTE) The clinical reportable range upper limit is being lowered to >1.10 to align with the FDA approved guidance for the current laboratory assay.   If heparin results are below expected values, and patient dosage has  been confirmed, suggest follow up testing of antithrombin III levels. Performed at [facility], [place] [address] [place], [place] [address]    Glucose, capillary     Status: Abnormal    Collection Time: 04/22/22  9:41 PM  Result Value Ref Range    Glucose-Capillary 141 (H) 70 - 99 mg/dL      Comment: Glucose reference range applies only to samples taken after fasting for at least 8 hours.  Magnesium     Status: None    Collection Time: 04/23/22  4:36 AM  Result Value Ref Range    Magnesium 1.7 1.7 - 2.4 mg/dL      Comment: Performed at [facility] [address], [place], [place]  [address]  Folate     Status: None    Collection Time: 04/23/22  4:36 AM  Result Value Ref Range    Folate 16.3 >5.9 ng/mL      Comment: Performed at [facility] [address], [place], [place] [address]  Ferritin     Status: Abnormal    Collection Time: 04/23/22  4:36 AM  Result Value Ref Range    Ferritin 453 (H) 11 - 307 ng/mL      Comment: Performed at [facility], [place] [address] [place], [place] [address]  Iron and TIBC     Status: Abnormal    Collection Time: 04/23/22  4:36 AM  Result Value Ref Range    Iron 46 28 - 170 ug/dL    TIBC 249 (L) 250 - 450 ug/dL    Saturation Ratios 19 10.4 - 31.8 %    UIBC 203 ug/dL      Comment: Performed at [facility], [place] [address], [place], [place] [address]  Phosphorus     Status: Abnormal    Collection Time: 04/23/22  4:36 AM  Result Value Ref Range    Phosphorus 2.2 (L) 2.5 - 4.6 mg/dL      Comment: Performed at [facility] [address] [place], [place] [address]  CBC with Differential/Platelet     Status: Abnormal    Collection Time: 04/23/22  4:36 AM  Result Value Ref Range    WBC 7.4 4.0 - 10.5 K/uL    RBC 3.23 (L) 3.87 - 5.11 MIL/uL    Hemoglobin 7.6 (L) 12.0 - 15.0 g/dL      Comment: Reticulocyte Hemoglobin testing may be clinically indicated, consider ordering this additional test [id number]      HCT 24.6 (L) 36.0 - 46.0 %    MCV 76.2 (L) 80.0 - 100.0 fL    MCH 23.5 (L) 26.0 - 34.0 pg    MCHC 30.9 30.0 - 36.0 g/dL    RDW 15.4 11.5 - 15.5 %  Platelets 217 150 - 400 K/uL    nRBC 0.3 (H) 0.0 - 0.2 %    Neutrophils Relative % 71 %    Neutro Abs 5.3 1.7 - 7.7 K/uL    Lymphocytes Relative 18 %    Lymphs Abs 1.3 0.7 - 4.0 K/uL    Monocytes Relative 6 %    Monocytes Absolute 0.5 0.1 - 1.0 K/uL    Eosinophils Relative 2 %    Eosinophils Absolute 0.1 0.0 - 0.5 K/uL    Basophils Relative 1 %    Basophils Absolute 0.0 0.0 - 0.1 K/uL    Immature Granulocytes 2 %    Abs Immature Granulocytes 0.16  (H) 0.00 - 0.07 K/uL      Comment: Performed at [facility] [address] [place],  [address]  Comprehensive metabolic panel     Status: Abnormal    Collection Time: 04/23/22  4:36 AM  Result Value Ref Range    Sodium 140 135 - 145 mmol/L    Potassium 3.8 3.5 - 5.1 mmol/L    Chloride 102 98 - 111 mmol/L    CO2 28 22 - 32 mmol/L    Glucose, Bld 333 (H) 70 - 99 mg/dL      Comment: Glucose reference range applies only to samples taken after fasting for at least 8 hours.    BUN 19 8 - 23 mg/dL    Creatinine, Ser 1.08 (H) 0.44 - 1.00 mg/dL    Calcium 8.6 (L) 8.9 - 10.3 mg/dL    Total Protein 5.9 (L) 6.5 - 8.1 g/dL    Albumin 2.3 (L) 3.5 - 5.0 g/dL    AST 29 15 - 41 U/L    ALT 50 (H) 0 - 44 U/L    Alkaline Phosphatase 119 38 - 126 U/L    Total Bilirubin 0.4 0.3 - 1.2 mg/dL    GFR, Estimated 55 (L) >60 mL/min      Comment: (NOTE) Calculated using the CKD-EPI Creatinine Equation (2021)      Anion gap 10 5 - 15      Comment: Performed at [facility], [place] [address], [place], [place] [address]  Heparin level (unfractionated)     Status: None    Collection Time: 04/23/22  4:36 AM  Result Value Ref Range    Heparin Unfractionated 0.38 0.30 - 0.70 IU/mL      Comment: (NOTE) The clinical reportable range upper limit is being lowered to >1.10 to align with the FDA approved guidance for the current laboratory assay.   If heparin results are below expected values, and patient dosage has  been confirmed, suggest follow up testing of antithrombin III levels. Performed at [facility], [place] [address], [place], [place] [address]    Glucose, capillary     Status: Abnormal    Collection Time: 04/23/22  7:59 AM  Result Value Ref Range    Glucose-Capillary 329 (H) 70 - 99 mg/dL      Comment: Glucose reference range applies only to samples taken after fasting for at least 8 hours.  Glucose, capillary     Status: Abnormal    Collection Time: 04/23/22 11:59 AM  Result  Value Ref Range    Glucose-Capillary 239 (H) 70 - 99 mg/dL      Comment: Glucose reference range applies only to samples taken after fasting for at least 8 hours.       Imaging Results (Last 48 hours)  CT  Angio Chest Pulmonary Embolism (PE) W or WO Contrast   Addendum Date: 04/22/2022   ADDENDUM REPORT: 04/22/2022 15:42 ADDENDUM: Imaging finding of small thrombus burden PE in right lower lobe and right heart dysfunction were relayed to Dr. Florene Glen by telephone call. Electronically Signed   By: Elmer Picker M.D.   On: 04/22/2022 15:42    Result Date: 04/22/2022 CLINICAL DATA:  Cardiac arrest, abnormal echocardiogram EXAM: CT ANGIOGRAPHY CHEST WITH CONTRAST TECHNIQUE: Multidetector CT imaging of the chest was performed using the standard protocol during bolus administration of intravenous contrast. Multiplanar CT image reconstructions and MIPs were obtained to evaluate the vascular anatomy. RADIATION DOSE REDUCTION: This exam was performed according to the departmental dose-optimization program which includes automated exposure control, adjustment of the mA and/or kV according to patient size and/or use of iterative reconstruction technique. CONTRAST:  73m OMNIPAQUE IOHEXOL 350 MG/ML SOLN COMPARISON:  Noncontrast CT chest done on 04/16/2022 and chest radiographs done on 04/19/2022 FINDINGS: Cardiovascular: There are no intraluminal filling defects in central pulmonary artery branches. In image 540of series 5, filling defect is seen in is subsegmental pulmonary artery branch in the posterior right lower lung field. Motion artifacts limit evaluation of small peripheral pulmonary artery branches. Coronary artery calcifications are seen. There is homogeneous enhancement in thoracic aorta. There are scattered calcifications in thoracic aorta and its major branches. Small pericardial effusion is present. RV LV ratio is 1.3. Mediastinum/Nodes: There are enlarged lymph nodes in both hilar regions and  mediastinum with no interval change. Lungs/Pleura: There is interval removal of endotracheal tube and enteric tube. There is interval improvement in aeration in both mid and lower lung fields. There is interval worsening of infiltrates in right upper lobe. There is linear focus of dense infiltrate in the medial left upper lobe, possibly chronic atelectasis. Residual patchy infiltrates are noted in right lower lobe. Small to moderate bilateral pleural effusions are seen, more so on the left side. There is interval decrease in amount of bilateral pleural effusions. There is no pneumothorax. Upper Abdomen: Surgical clips are seen in gallbladder fossa. There is 5 mm calcific density in the upper pole of right kidney. Musculoskeletal: No acute findings are seen. Review of the MIP images confirms the above findings. IMPRESSION: There is intraluminal filling defect in small subsegmental branch in right lower lobe suggesting pulmonary embolism with very small thrombus burden. No other definite intraluminal filling defects are seen. RV LV ratio is 1.3 which may suggest chronic right heart dysfunction. Small pericardial effusion is present. Patchy infiltrates are seen in both lungs with areas of improvement and areas of worsening since 04/16/2022. Findings suggest multifocal pneumonia. There is dense atelectasis in the medial left upper lobe which has not changed significantly. There is interval decrease in amount of bilateral pleural effusions. Coronary artery disease.  Right renal calculus. Other findings as described in the body of the report. Electronically Signed: By: PElmer PickerM.D. On: 04/22/2022 15:32       Narrative & Impression  CLINICAL DATA:  Shortness of breath.  Cardiac arrest.   EXAM: PORTABLE CHEST 1 VIEW   COMPARISON:  One-view chest x-ray 04/24/2022. CT angio chest 04/22/2022   FINDINGS: Heart is enlarged. Overall lung volumes have improved. Interstitial and airspace opacities are  improving on the right. Areas of atelectasis are present on the left. Moderate pulmonary vascular congestion remains.   IMPRESSION: 1. Improving interstitial and airspace opacities consistent with improving infection and edema. 2. Cardiomegaly and moderate pulmonary vascular congestion.  Electronically Signed   By: San Morelle M.D.   On: 04/25/2022 10:23        Blood pressure 133/69, pulse 93, temperature 98.3 F (36.8 C), temperature source Oral, resp. rate 18, height _0  (1.6 m), weight 77.7 kg, SpO2 100 %.   Medical Problem List and Plan: 1. Functional deficits secondary to debility from cardiac arrest with CPR >2 minutes-              -patient may  shower             -ELOS/Goals: 12-16 days- mod I to supervision 2.  Antithrombotics: -DVT/anticoagulation:  Pharmaceutical: Eliquis              -antiplatelet therapy: none   3. Pain Management: Tylenol as needed             Sore ribs from CPR- might benefit from Lidoderm patches? 4. Mood/Behavior/Sleep: LCSW to evaluate and provide emotional support             -antipsychotic agents: n/a   5. Neuropsych/cognition: This patient is capable of making decisions on her own behalf. Doesn't have signs of anoxia- but will order SLP for cognition just to make sure don't miss some mild issues   6. Skin/Wound Care: Routine skin care checks   7. Fluids/Electrolytes/Nutrition: routine Is and Os -carb modified diet -K phos 500 mg TID -Lasix continues for diffuse edema - edema almost resolved in legs- will follow IM recs on when to stop Lasix- maybe earlier if improves. 8: Cardiac arrest, suspected PE s/p tenecteplase             -follow-up with Dr. Florene Glen   9: Seizure-like activity: Keppra discontinued   10: AKI: creatinine at baseline; follow-up BMP   11: Respiratory failure/pneumonia: completed antibiotics             -pulmonary toilet; albuterol nebs as needed             -Duoneb BID   12: Bilateral pleural  effusions: repeat chest x-ray on 12/30>>improvement (see report above)             -diuresis continues with Lasix 40 mg daily   13: Anemia: Hemoglobin with downward trend: 7.6; recheck CBC    14: Rib fractures: pain control   15: Hypertension: monitor TID and prn             continue  Lasix 40 mg daily             -continue Norvasc 7.5 mg daily   16: DM/DKA: Ca1c >15.5 BGs q AC and q HS; (home meds: Jardiance 25 mg daily, Toujeo 100 units daily, Humulin R U500 60 units TID with meals)              -SSI             -Novolog 8 units with meals             -Semglee 18 units BID   17: Left upper lobe atelectasis: Concern for chronic obstruction of left upper lobe bronchus             -Recommend follow-up CT scan in 3 months versus other imaging   18: Hypokalemia: replete; follow-up BMP   19: Hyperlipidemia: Crestor   20: Hypomagnesemia: continue Mag-ox 400 mg daily; recheck mag 1/1   21: Elevated TSH: T3, T4 pending     I have personally performed a face to face diagnostic evaluation of  this patient and formulated the key components of the plan.  Additionally, I have personally reviewed laboratory data, imaging studies, as well as relevant notes and concur with the physician assistant's documentation above.   The patient's status has not changed from the original H&P.  Any changes in documentation from the acute care chart have been noted above.       Barbie Banner, PA-C 04/23/2022

## 2022-04-25 NOTE — Progress Notes (Signed)
Pt refused cpap

## 2022-04-25 NOTE — Progress Notes (Signed)
Signed     PMR Admission Coordinator Pre-Admission Assessment   Patient: Deborah Neal is an 71 y.o., female MRN: [id number] DOB: 11-Dec-1950 Height: _0  (160 cm) Weight: 74.3 kg   Insurance Information HMO:     PPO:    yes  PCP:      IPA:      80/20:      OTHER:  PRIMARY: [employer]       Policy#: [id number]      Subscriber: pt CM Name: Armando Gang      Phone#: [phone] ext [phone]     Fax#: [phone] Pre-Cert#: [id number] approved for 7 days f/u with Luster Landsberg phone [phone] ext [phone] fax [phone]   Employer:  Benefits:  Phone #: [phone]     Name:  Eff. Date: 04/26/21 Deduct: none      Out of Pocket Max: $4000 CIR: $160 co pay per day days 1 until 10      SNF: no co pay per day days 21 until 100 Outpatient: $20 per visit     Co-Pay: visits per medical neccesity Home Health: 100%      Co-Pay: visits per medical neccesity DME: 80%     Co-Pay: 20% Providers: in network  SECONDARY: none   Financial Counselor:       Phone#:    [employer]" for patients in Inpatient Rehabilitation Facilities with attached "Privacy Act [place] Records" was provided and verbally reviewed with: Patient and Family   Emergency Contact Information Contact Information       Name Relation Home Work Mobile    Griffin,Margaret Daughter [phone]   [phone]    Rankin,Patrica Daughter [phone]        Huff,Sandy Daughter [phone]             Current Medical History  Patient Admitting Diagnosis: cardiac arrest   History of Present Illness: 71 year old female with history of asthma, HTN, HLD and type 2 DM presented on 04/09/22 after she was found unresponsive at home. Witnessed tonic-clonic seizure followed by lost pulse and intubated.   VFib/Vtach arrest felt secondary of severe hypokalemia. Presumed massive PE s/p TNKase. Placed on Heparin gtt. LE [place] negative for DVT. Echo with findings concerning for clot in transit, RV  function mildly reduced with free wall hypokinesis, EF 65 to 70% no RWMA, grade 1 diastolic dysfunction. Eventually extubated 12/23.Placed on Cefepime for aspiration PNA with bilateral pleural effusions and serratia marcescens PNA. On lasix for diureses . AKI with creatinine peaked at 5.64. Improving and to monitor on lasix. Diffuse body wall edema noted on CT 12/19. Continue lasix and follow albumin. EEG findings suggestive of severe to profound diffuse encephalopathy, no seizures or epileptiform discharged. Currently on Keppra. MRI brain negative.  Dense atelectasis in LUL with decreased volume in Left lung. Concern for chronic obstruction of LUL bronchus and needs short term follow up CT in 3 months vs endoscopy or PET CT. Amlodipine started on 12/26.DKA to continue basal bolus regimen. Holding Jardiance for AKI.    Cardiology consulted on 12/29 and felt no workup needed for coronary ischemia. Felt presentation related to the PE. Will repeat limited echo 12/29  results pending to follow up on small pericardial effusion done 14 days prior. Heparin gtt discontinued 12/29 and transitioned to Eliquis.   Patient's medical record from [facility] has been reviewed by the rehabilitation admission coordinator and physician.   Past Medical History      Past Medical  History:  Diagnosis Date   ALLERGIC RHINITIS 01/29/2007   Arthritis     ASTHMA 01/29/2007   ASTHMA, WITH ACUTE EXACERBATION 06/24/2008   BACK PAIN 01/29/2009   CHEST PAIN 06/24/2008   Family history of adverse reaction to anesthesia      Patients daughter has N/V after anesthesia   GERD (gastroesophageal reflux disease)     History of shingles     HYPERLIPIDEMIA 01/29/2007   HYPERTENSION 01/29/2007   OSTEOPENIA 10/16/2007   Type II or unspecified type diabetes mellitus without mention of complication, uncontrolled 11/29/2013    Has the patient had major surgery during 100 days prior to admission? No   Family History   family history  includes Asthma in her daughter and father; Cancer in her mother; Diabetes in her father; Heart disease in her father; Hypertension in her father.   Current Medications   Current Facility-Administered Medications:    0.9 %  sodium chloride infusion, 250 mL, Intravenous, Continuous, Jacky Kindle, MD, Stopped at 04/17/22 1020   acetaminophen (TYLENOL) tablet 650 mg, 650 mg, Oral, Q4H PRN, 650 mg at 04/25/22 0734 **OR** acetaminophen (TYLENOL) 160 MG/5ML solution 650 mg, 650 mg, Per Tube, Q4H PRN, 650 mg at 04/19/22 1655 **OR** acetaminophen (TYLENOL) suppository 650 mg, 650 mg, Rectal, Q4H PRN, Corey Harold, NP   albuterol (PROVENTIL) (2.5 MG/3ML) 0.083% nebulizer solution 2.5 mg, 2.5 mg, Nebulization, Q2H PRN, Corey Harold, NP, 2.5 mg at 04/20/22 0202   amLODipine (NORVASC) tablet 7.5 mg, 7.5 mg, Oral, Daily, Elie Confer, Sarah F, NP, 7.5 mg at 04/24/22 0813   apixaban (ELIQUIS) tablet 5 mg, 5 mg, Oral, BID, Einar Grad, RPH, 5 mg at 04/25/22 0040   artificial tears (LACRILUBE) ophthalmic ointment, , Both Eyes, Q4H PRN, Jacky Kindle, MD, Given at 04/11/22 0811   docusate (COLACE) 50 MG/5ML liquid 100 mg, 100 mg, Per Tube, BID PRN, Corey Harold, NP   feeding supplement (ENSURE ENLIVE / ENSURE PLUS) liquid 237 mL, 237 mL, Oral, TID WC, Candee Furbish, MD, 237 mL at 04/25/22 0735   furosemide (LASIX) injection 40 mg, 40 mg, Intravenous, Daily, Candee Furbish, MD, 40 mg at 04/24/22 0814   insulin aspart (novoLOG) injection 0-15 Units, 0-15 Units, Subcutaneous, TID WC, Candee Furbish, MD, 5 Units at 04/25/22 0741   insulin aspart (novoLOG) injection 8 Units, 8 Units, Subcutaneous, TID WC, Elodia Florence., MD, 8 Units at 04/25/22 0741   insulin glargine-yfgn (SEMGLEE) injection 18 Units, 18 Units, Subcutaneous, BID, Elodia Florence., MD, 18 Units at 04/25/22 0041   lip balm (BLISTEX) ointment, , Topical, PRN, Jacky Kindle, MD, Given at 04/11/22 1233   losartan (COZAAR) tablet  25 mg, 25 mg, Oral, Daily, Elodia Florence., MD, 25 mg at 04/24/22 1844   magnesium oxide (MAG-OX) tablet 400 mg, 400 mg, Oral, Daily, Elodia Florence., MD, 400 mg at 04/24/22 3893   multivitamin with minerals tablet 1 tablet, 1 tablet, Oral, Daily, Candee Furbish, MD, 1 tablet at 04/24/22 0814   ondansetron [facility]) injection 4 mg, 4 mg, Intravenous, Q6H PRN, Corey Harold, NP   Oral care mouth rinse, 15 mL, Mouth Rinse, PRN, Corey Harold, NP   pantoprazole (PROTONIX) injection 40 mg, 40 mg, Intravenous, QHS, Corey Harold, NP, 40 mg at 04/25/22 0040   polyethylene glycol (MIRALAX / GLYCOLAX) packet 17 g, 17 g, Per Tube, Daily PRN, Corey Harold, NP   potassium chloride SA (KLOR-CON  M) CR tablet 40 mEq, 40 mEq, Oral, Q4H, Florene Glen, A Clint Lipps., MD   rosuvastatin (CRESTOR) tablet 20 mg, 20 mg, Oral, Daily, Elodia Florence., MD, 20 mg at 04/24/22 1844   Patients Current Diet:  Diet Order                  Diet Carb Modified Fluid consistency: Thin; Room service appropriate? Yes  Diet effective now                       Precautions / Restrictions Precautions Precautions: Fall Precaution Comments: HFNC Restrictions Weight Bearing Restrictions: No    Has the patient had 2 or more falls or a fall with injury in the past year? No   Prior Activity Level Community (5-7x/wk): independent, did not drive   Prior Functional Level Self Care: Did the patient need help bathing, dressing, using the toilet or eating? Independent   Indoor Mobility: Did the patient need assistance with walking from room to room (with or without device)? Independent   Stairs: Did the patient need assistance with internal or external stairs (with or without device)? Independent   Functional Cognition: Did the patient need help planning regular tasks such as shopping or remembering to take medications? Independent   Patient Information Are you of Hispanic, Latino/a,or Spanish  origin?: A. No, not of Hispanic, Latino/a, or Spanish origin What is your race?: B. Black or African American Do you need or want an interpreter to communicate with a doctor or health care staff?: 0. No   Patient's Response To:  Health Literacy and Transportation Is the patient able to respond to health literacy and transportation needs?: Yes Health Literacy - How often do you need to have someone help you when you read instructions, pamphlets, or other written material from your doctor or pharmacy?: Never In the past 12 months, has lack of transportation kept you from medical appointments or from getting medications?: No In the past 12 months, has lack of transportation kept you from meetings, work, or from getting things needed for daily living?: No   Home Assistive Devices / Equipment Home Equipment: None   Prior Device Use: Indicate devices/aids used by the patient prior to current illness, exacerbation or injury? None of the above   Current Functional Level Cognition   Overall Cognitive Status: Within Functional Limits for tasks assessed Orientation Level: Oriented X4 General Comments: slow to respond at times and needs cues for safety with mobility tasks. slow initiation as well    Extremity Assessment (includes Sensation/Coordination)   Upper Extremity Assessment: Generalized weakness  Lower Extremity Assessment: Defer to PT evaluation     ADLs   Overall ADL's : Needs assistance/impaired Eating/Feeding: Set up, Sitting Grooming: Min guard, Standing Upper Body Bathing: Moderate assistance Lower Body Bathing: Moderate assistance Upper Body Dressing : Minimal assistance Lower Body Dressing: Moderate assistance Toilet Transfer: Min guard, Rolling walker (2 wheels), Ambulation, Regular Toilet Toileting- Clothing Manipulation and Hygiene: Maximal assistance Toileting - Clothing Manipulation Details (indicate cue type and reason): pericare Functional mobility during ADLs: Min  guard, Rolling walker (2 wheels)     Mobility   Overal bed mobility: Needs Assistance Bed Mobility: Supine to Sit Supine to sit: HOB elevated, Min assist Sit to supine: Min assist General bed mobility comments: increased time and effort required     Transfers   Overall transfer level: Needs assistance Equipment used: Rolling walker (2 wheels) Transfers: Sit to/from Stand Sit to Stand:  Min assist Bed to/from chair/wheelchair/BSC transfer type:: Stand pivot Stand pivot transfers: Mod assist General transfer comment: lifting assistance required to stand     Ambulation / Gait / Stairs / Wheelchair Mobility   Ambulation/Gait Ambulation/Gait assistance: Herbalist (Feet): 75 Feet Assistive device: Rolling walker (2 wheels) Gait Pattern/deviations: Trunk flexed, Decreased stride length General Gait Details: decreased foot clearance bilaterally. cues to increase step length/height and to stand closer to rolling walker for support. steadying assistance required with ambulation as well as occasional assistance for rolling walker negotiation, expecially around obstacles and with turns. Sp02 remained in the upper 90's with ambulation on 3 L02. activity tolerance limited by fatigue and one standing rest break required Gait velocity: decreased Gait velocity interpretation: <1.31 ft/sec, indicative of household ambulator Pre-gait activities: marching in place     Posture / Balance Dynamic Sitting Balance Sitting balance - Comments: when feet not supported needing UE support Balance Overall balance assessment: Needs assistance Sitting-balance support: Feet supported Sitting balance-Leahy Scale: Fair Sitting balance - Comments: when feet not supported needing UE support Standing balance support: Single extremity supported, During functional activity, Bilateral upper extremity supported, Reliant on assistive device for balance Standing balance-Leahy Scale: Poor Standing balance  comment: external support required, rolling walker used     Special needs/care consideration      Previous Home Environment  Living Arrangements:  (grand daughter, [place], lives with patient. Margaret's daughter is [place])  Lives With: Other (Comment) (granddaughter, [place], lives with patient) Available Help at Discharge: Family, Available 24 hours/day Joycelyn Schmid and [place] to provide 24/7 care) Type of Home: House Home Layout: One level Home Access: Stairs to enter Entrance Stairs-Rails: Right Entrance Stairs-Number of Steps: 4 Bathroom Shower/Tub: Chiropodist: Standard Bathroom Accessibility: Yes How Accessible: Accessible via walker [place]: No Additional Comments: Ive explained  to family that patient does not have medicaid  to provide PCS services; Joycelyn Schmid to reapply   Discharge Living Setting Plans for Discharge Living Setting: Patient's home, Other (Comment) ([place] lives with patient, granddaughter) Type of Home at Discharge: House Discharge Home Layout: One level Discharge Home Access: Stairs to enter Entrance Stairs-Rails: Right Entrance Stairs-Number of Steps: 4 Discharge Bathroom Shower/Tub: [place] unit Discharge Bathroom Toilet: Standard Discharge [place] Accessibility: Yes How Accessible: Accessible via walker Does the patient have any problems obtaining your medications?: No   Social/Family/Support Systems Patient Roles: Parent Contact Information: Joycelyn Schmid is primary contact Anticipated Caregiver: Therapist, art and [place] Anticipated Ambulance person Information: see contacts Caregiver Availability: 24/7 Discharge Plan Discussed with Primary Caregiver: Yes Is Caregiver In Agreement with Plan?: Yes Does Caregiver/Family have Issues with Lodging/Transportation while Pt is in Rehab?: Yes   Goals Patient/Family Goal for Rehab: Mod I to supervision with PT and OT Expected length of stay: ELOS 10 to 14  days Pt/Family Agrees to Admission and willing to participate: Yes Program Orientation Provided & Reviewed with Pt/Caregiver Including Roles  & Responsibilities: Yes   Decrease burden of Care through IP rehab admission: n/a   Possible need for SNF placement upon discharge: not anticipated   Patient Condition: I have reviewed medical records from [facility], spoken with CM, and patient and daughter. I met with patient at the bedside for inpatient rehabilitation assessment.  Patient will benefit from ongoing PT and OT, can actively participate in 3 hours of therapy a day 5 days of the week, and can make measurable gains during the admission.  Patient will also benefit from the coordinated team approach  during an Inpatient Acute Rehabilitation admission.  The patient will receive intensive therapy as well as Rehabilitation physician, nursing, social worker, and care management interventions.  Due to bladder management, bowel management, safety, skin/wound care, disease management, medication administration, pain management, and patient education the patient requires 24 hour a day rehabilitation nursing.  The patient is currently min to mod assist overall with mobility and basic ADLs.  Discharge setting and therapy post discharge at home with home health is anticipated.  Patient has agreed to participate in the Acute Inpatient Rehabilitation Program and will admit today.   Preadmission Screen Completed [id number] Boyette RN MSN, 04/25/2022 9:11 AM ______________________________________________________________________   Discussed status with Dr. Dagoberto Ligas on 04/25/22  at 9:11 AM and received approval for admission today.   Admission Coordinator: Danne Baxter RN MSN, time 9:11 AM/Date 04/25/22     Assessment/Plan: Diagnosis: Does the need for close, 24 hr/day Medical supervision in concert with the patient's rehab needs make it unreasonable for this patient to be served in a less intensive  setting? Yes Co-Morbidities requiring supervision/potential complications: diffuse encephalopathy, cardiac arrest, debility, pneumonia; AKI- improving; anasarca Due to bladder management, bowel management, safety, skin/wound care, disease management, medication administration, pain management, and patient education, does the patient require 24 hr/day rehab nursing? Yes Does the patient require coordinated care of a physician, rehab nurse, PT, OT, and SLP to address physical and functional deficits in the context of the above medical diagnosis(es)? Yes Addressing deficits in the following areas: balance, endurance, locomotion, strength, transferring, bowel/bladder control, bathing, dressing, feeding, grooming, toileting, and cognition Can the patient actively participate in an intensive therapy program of at least 3 hrs of therapy 5 days a week? Yes The potential for patient to make measurable gains while on inpatient rehab is good Anticipated functional outcomes upon discharge from inpatient rehab: modified independent and supervision PT, modified independent and supervision OT, modified independent and supervision SLP Estimated rehab length of stay to reach the above functional goals is: 12-16 days Anticipated discharge destination: Home 10. Overall Rehab/Functional Prognosis: good     MD Signature:

## 2022-04-25 NOTE — Progress Notes (Signed)
Patient ID: Deborah Neal, female   DOB: [birth year], 71 y.o.   MRN: [id number] PT arrived to 4w04 per wheelchair. Family at bedside. Pt/family oriented to rehab and policies reviewed. Pt/family in agreement. Call light in reach, bed alarm on, in low position. Pt instructed to call for assistance. Assessments and vitals obtained. ,Sheela Stack, LPN

## 2022-04-25 NOTE — Progress Notes (Signed)
Inpatient Rehabilitation Admission Medication Review by a Pharmacist  A complete drug regimen review was completed for this patient to identify any potential clinically significant medication issues.  High Risk Drug Classes Is patient taking? Indication by Medication  Antipsychotic Yes Prochlorperazine prn nausea  Anticoagulant Yes Apixaban - PE  Antibiotic No   Opioid No   Antiplatelet No   Hypoglycemics/insulin Yes Glargine, aspart - T2DM  Vasoactive Medication Yes Amlodipine, furosemide, losartan - HTN  Chemotherapy No   Other Yes Albuterol prn SOB/wheezing Mag-ox - supplement Rosuvastatin - HLD Methocarbamol prn Miralax, Fleet, sorbitol prn constipation Trazodone prn sleep Diphenhydramine prn itching     Type of Medication Issue Identified Description of Issue Recommendation(s)  Drug Interaction(s) (clinically significant)     Duplicate Therapy     Allergy     No Medication Administration End Date     Incorrect Dose     Additional Drug Therapy Needed     Significant med changes from prior encounter (inform family/care partners about these prior to discharge). PTA meds not resumed: Aspirin Empagliflozin U-500 insulin Potassium chloride Tizanidine prn Consider resuming PTA medications during CIR admit or upon discharge as appropriate    Other       Clinically significant medication issues were identified that warrant physician communication and completion of prescribed/recommended actions by midnight of the next day:  No  Name of provider notified for urgent issues identified:   Provider Method of Notification:   Pharmacist comments:   Time spent performing this drug regimen review (minutes):  [place], PharmD, BCPS Clinical Pharmacist Clinical phone for 04/25/2022 is [phone] 04/25/2022 4:33 PM

## 2022-04-25 NOTE — Progress Notes (Signed)
Inpatient Rehab Admissions Coordinator:  There is a bed available for pt in CIR today. Dr. Florene Glen is aware and in agreement. Pt, pt's daughters Embden and Joycelyn Schmid, NSG, and [facility] made aware.   Gayland Curry, [place], [place] Admissions Coordinator [phone]

## 2022-04-26 DIAGNOSIS — R5381 Other malaise: Secondary | ICD-10-CM | POA: Diagnosis not present

## 2022-04-26 LAB — CBC WITH DIFFERENTIAL/PLATELET
Abs Immature Granulocytes: 0.04 10*3/uL (ref 0.00–0.07)
Basophils Absolute: 0 10*3/uL (ref 0.0–0.1)
Basophils Relative: 1 %
Eosinophils Absolute: 0.1 10*3/uL (ref 0.0–0.5)
Eosinophils Relative: 1 %
HCT: 27.6 % — ABNORMAL LOW (ref 36.0–46.0)
Hemoglobin: 8.3 g/dL — ABNORMAL LOW (ref 12.0–15.0)
Immature Granulocytes: 1 %
Lymphocytes Relative: 18 %
Lymphs Abs: 1.3 10*3/uL (ref 0.7–4.0)
MCH: 23.1 pg — ABNORMAL LOW (ref 26.0–34.0)
MCHC: 30.1 g/dL (ref 30.0–36.0)
MCV: 76.7 fL — ABNORMAL LOW (ref 80.0–100.0)
Monocytes Absolute: 0.5 10*3/uL (ref 0.1–1.0)
Monocytes Relative: 7 %
Neutro Abs: 5.5 10*3/uL (ref 1.7–7.7)
Neutrophils Relative %: 72 %
Platelets: 360 10*3/uL (ref 150–400)
RBC: 3.6 MIL/uL — ABNORMAL LOW (ref 3.87–5.11)
RDW: 16.4 % — ABNORMAL HIGH (ref 11.5–15.5)
WBC: 7.5 10*3/uL (ref 4.0–10.5)
nRBC: 0.4 % — ABNORMAL HIGH (ref 0.0–0.2)

## 2022-04-26 LAB — COMPREHENSIVE METABOLIC PANEL
ALT: 43 U/L (ref 0–44)
AST: 28 U/L (ref 15–41)
Albumin: 2.5 g/dL — ABNORMAL LOW (ref 3.5–5.0)
Alkaline Phosphatase: 114 U/L (ref 38–126)
Anion gap: 11 (ref 5–15)
BUN: 14 mg/dL (ref 8–23)
CO2: 29 mmol/L (ref 22–32)
Calcium: 8.7 mg/dL — ABNORMAL LOW (ref 8.9–10.3)
Chloride: 100 mmol/L (ref 98–111)
Creatinine, Ser: 1.02 mg/dL — ABNORMAL HIGH (ref 0.44–1.00)
GFR, Estimated: 59 mL/min — ABNORMAL LOW (ref >60)
Glucose, Bld: 86 mg/dL (ref 70–99)
Potassium: 3.7 mmol/L (ref 3.5–5.1)
Sodium: 140 mmol/L (ref 135–145)
Total Bilirubin: 0.3 mg/dL (ref 0.3–1.2)
Total Protein: 6.2 g/dL — ABNORMAL LOW (ref 6.5–8.1)

## 2022-04-26 LAB — T3, FREE: T3, Free: 2.1 pg/mL (ref 2.0–4.4)

## 2022-04-26 LAB — MAGNESIUM: Magnesium: 1.8 mg/dL (ref 1.7–2.4)

## 2022-04-26 LAB — GLUCOSE, CAPILLARY
Glucose-Capillary: 91 mg/dL (ref 70–99)
Glucose-Capillary: 175 mg/dL — ABNORMAL HIGH (ref 70–99)
Glucose-Capillary: 90 mg/dL (ref 70–99)
Glucose-Capillary: 331 mg/dL — ABNORMAL HIGH (ref 70–99)

## 2022-04-26 MED ORDER — LEVOTHYROXINE SODIUM 75 MCG PO TABS
75.0000 ug | ORAL_TABLET | Freq: Every day | ORAL | Status: DC
  Administered 2022-04-27 – 2022-05-04 (×8): 75 ug via ORAL
  Filled 2022-04-26 (×8): qty 1

## 2022-04-26 NOTE — Evaluation (Signed)
Occupational Therapy Assessment and Plan  Patient Details  Name: CLEVIE PROUT MRN: [id number] Date of Birth: 1950/11/22  OT Diagnosis: cognitive deficits, muscle weakness (generalized), and decreased activity tolerance Rehab Potential: Rehab Potential (ACUTE ONLY): Good ELOS: 5-7 days   Today's Date: 04/26/2022 OT Individual Time: 8756-4332 OT Individual Time Calculation (min): 68 min     Hospital Problem: Principal Problem:   Debility Active Problems:   Cardiac arrest [facility])   Past Medical History:  Past Medical History:  Diagnosis Date   ALLERGIC RHINITIS 01/29/2007   Arthritis    ASTHMA 01/29/2007   ASTHMA, WITH ACUTE EXACERBATION 06/24/2008   BACK PAIN 01/29/2009   CHEST PAIN 06/24/2008   Family history of adverse reaction to anesthesia    Patients daughter has N/V after anesthesia   GERD (gastroesophageal reflux disease)    History of shingles    HYPERLIPIDEMIA 01/29/2007   HYPERTENSION 01/29/2007   OSTEOPENIA 10/16/2007   Type II or unspecified type diabetes mellitus without mention of complication, uncontrolled 11/29/2013   Past Surgical History:  Past Surgical History:  Procedure Laterality Date   CHOLECYSTECTOMY N/A 09/19/2015   Procedure: LAPAROSCOPIC CHOLECYSTECTOMY WITH INTRAOPERATIVE CHOLANGIOGRAM;  Surgeon: Jackolyn Confer, MD;  Location: [place];  Service: General;  Laterality: N/A;   COLONOSCOPY     TONSILLECTOMY  1956   VIDEO BRONCHOSCOPY Bilateral 03/28/2014   Procedure: VIDEO BRONCHOSCOPY WITHOUT FLUORO;  Surgeon: Tanda Rockers, MD;  Location: [facility];  Service: Cardiopulmonary;  Laterality: Bilateral;    Assessment & Plan Clinical Impression: Patient is a 72 y.o. year old female "whose family attempted to contact her on the phone morning of 04/09/2022.  When she did not answer they found her at home unresponsive.  This was called and upon arrival she lost her pulse and CPR was initiated.  Demonstrated seizure-like activity on route to emergency department which  improved with benzodiazepine administration.  She was intubated and admitted to pulmonary critical care medicine.  Laboratory workup significant for lactic acidosis, acute kidney injury, elevated troponins and a hemoglobin A1c of greater than 15.5%.  Unable to do CTA of the chest due to kidney function.  She was initially started on heparin infusion due to concern for pulmonary embolism.  She subsequently became hypotensive and required Levophed then went into cardiac arrest with ventricular tachycardia/ventricular fibrillation.  ROSC was achieved after 2 minutes.  She was treated with tenecteplase.  Extremity ultrasound negative for DVT.  She was treated for acute hypoxic respiratory failure with multifocal pneumonia secondary to Serratia marcescens.  After renal recovery, she underwent CT angiogram which showed an intraluminal filling defect and a small subsegmental branch in the right lower lobe suggesting pulmonary embolism.  She was extubated on 12/23.  She is currently receiving cefepime IV which is to be completed 12/29.  Lasix continues for diffuse body wall edema.  Her EEG was suggestive of severe to profound diffuse encephalopathy and MRI of the brain was without acute intracranial abnormality.  On Keppra, discontinued on 12/29. Diabetic coordinator consulted for insulin management.  Per medication administration record, patient started on Eliquis and heparin infusion discontinued 12/29. Cardiology consulted on 12/29 and echocardiogram performed. EF estimated at 60-65%.  The patient requires inpatient physical medicine and rehabilitation evaluations and treatment secondary to dysfunction due to ability secondary to cardiac arrest, pneumonia." Patient transferred to CIR on 04/25/2022 .    Patient currently requires  CGA-SUP  with basic self-care skills secondary to muscle weakness, decreased cardiorespiratoy endurance, decreased safety awareness and  decreased memory. Prior to hospitalization, patient  could complete ADLs/IADLs with modified independence.  Patient will benefit from skilled intervention to increase independence with basic self-care skills and increase level of independence with iADL prior to discharge home with care partner.  Anticipate patient will require intermittent supervision and follow up outpatient.  OT - End of Session Activity Tolerance: Tolerates 10 - 20 min activity with multiple rests Endurance Deficit: Yes Endurance Deficit Description: pt fatigues quickly with gait OT Assessment Rehab Potential (ACUTE ONLY): Good OT Barriers to Discharge: Incontinence OT Barriers to Discharge Comments: Pt with episodes of incontinence throughout hospital stay. OT Patient demonstrates impairments in the following area(s): Cognition;Endurance;Safety;Balance OT Basic ADL's Functional Problem(s): Bathing;Dressing;Toileting OT Advanced ADL's Functional Problem(s): Simple Meal Preparation;Light Housekeeping;Laundry OT Transfers Functional Problem(s): Toilet;Tub/Shower OT Plan OT Intensity: Minimum of 1-2 x/day, 45 to 90 minutes OT Frequency: 5 out of 7 days OT Duration/Estimated Length of Stay: 5-7 days OT Treatment/Interventions: Balance/vestibular training;Cognitive remediation/compensation;Community reintegration;Discharge planning;DME/adaptive equipment instruction;Functional mobility training;Patient/family education;Self Care/advanced ADL retraining;Therapeutic Activities;Therapeutic Exercise;UE/LE Strength taining/ROM;UE/LE Coordination activities OT Basic Self-Care Anticipated Outcome(s): Mod I OT Toileting Anticipated Outcome(s): Mod I OT Bathroom Transfers Anticipated Outcome(s): Mod I OT Recommendation Patient destination: Home Follow Up Recommendations: Outpatient OT Equipment Recommended: To be determined   OT Evaluation Precautions/Restrictions  Precautions Precautions: Fall Restrictions Weight Bearing Restrictions: No General Chart Reviewed:  Yes Family/Caregiver Present: Yes (Daughter Margaret) Vital Signs Therapy Vitals Pulse Rate: 89 BP: 119/61 Patient Position (if appropriate): Sitting Oxygen Therapy SpO2: 99 % O2 Device: Room Air Pain Pain Assessment Pain Scale: 0-10 Pain Score: 7  Pain Type: Acute pain Pain Location: Chest Pain Intervention(s): RN made aware;Rest;Shower Home Living/Prior Functioning Home Living Family/patient expects to be discharged to:: Private residence Living Arrangements: Other relatives Available Help at Discharge: Family, Available 24 hours/day Type of Home: House Home Access: Stairs to enter Technical brewer of Steps: 4 Entrance Stairs-Rails: Right Home Layout: One level Bathroom Shower/Tub: Chiropodist: Standard Bathroom Accessibility: Yes  Lives With: Other (Comment) (Lives with granddaughter, [place].) IADL History Homemaking Responsibilities: Yes Meal Prep Responsibility: Secondary Laundry Responsibility: Secondary Cleaning Responsibility: Secondary Current License: No Occupation: Retired Leisure and Hobbies: Shopping Prior Function Level of Independence: Independent with basic ADLs, Independent with homemaking with ambulation  Able to Take Stairs?: Yes Driving: No Vocation: Retired Leisure: Hobbies-yes (Comment) (shopping) Vision Baseline Vision/History: 1 Wears glasses Ability to See in Adequate Light: 0 Adequate Patient Visual Report: No change from baseline Vision Assessment?: No apparent visual deficits Perception  Perception: Within Functional Limits Praxis Praxis: Intact Cognition Cognition Overall Cognitive Status: Within Functional Limits for tasks assessed Arousal/Alertness: Awake/alert Orientation Level: Person;Place;Situation Memory: Impaired Memory Impairment: Decreased recall of new information Attention: Selective Awareness: Appears intact Problem Solving: Impaired Problem Solving Impairment: Functional  complex Safety/Judgment: Impaired Brief Interview for Mental Status (BIMS) Repetition of Three Words (First Attempt): 3 Temporal Orientation: Year: Correct Temporal Orientation: Month: Accurate within 5 days Temporal Orientation: Day: Correct Recall: "Sock": Yes, after cueing ("something to wear") Recall: "Blue": Yes, no cue required Recall: "Bed": Yes, after cueing ("a piece of furniture") BIMS Summary Score: 13 Sensation Sensation Light Touch: Appears Intact Coordination Gross Motor Movements are Fluid and Coordinated: Yes Fine Motor Movements are Fluid and Coordinated: Yes Motor  Motor Motor: Within Functional Limits Motor - Skilled Clinical Observations: pt with some generalized weakness but otherwise [facility]  Trunk/Postural Assessment  Cervical Assessment Cervical Assessment: Exceptions to [facility] (Forward head) Thoracic Assessment Thoracic Assessment: Exceptions to [facility] (Forward shoulders) Lumbar  Assessment Lumbar Assessment: Exceptions to [facility] (Posterior pelvic tilt) Postural Control Postural Control: Within Functional Limits  Balance Balance Balance Assessed: Yes Static Sitting Balance Static Sitting - Balance Support: Feet supported;Bilateral upper extremity supported Static Sitting - Level of Assistance: 6: Modified independent (Device/Increase time) Dynamic Sitting Balance Dynamic Sitting - Balance Support: During functional activity Dynamic Sitting - Level of Assistance: 5: Stand by assistance Dynamic Sitting - Balance Activities: Lateral lean/weight shifting;Forward lean/weight shifting Static Standing Balance Static Standing - Balance Support: During functional activity Static Standing - Level of Assistance: 5: Stand by assistance Dynamic Standing Balance Dynamic Standing - Balance Support: During functional activity Dynamic Standing - Level of Assistance:  (CGA) Dynamic Standing - Balance Activities: Lateral lean/weight shifting Extremity/Trunk Assessment RUE  Assessment RUE Assessment: Within Functional Limits LUE Assessment LUE Assessment: Within Functional Limits  Care Tool Care Tool Self Care Eating   Eating Assist Level: Independent    Oral Care    Oral Care Assist Level: Independent    Bathing   Body parts bathed by patient: Right arm;Left arm;Chest;Abdomen;Front perineal area;Buttocks;Right upper leg;Left upper leg;Right lower leg;Left lower leg;Face     Assist Level: Supervision/Verbal cueing    Upper Body Dressing(including orthotics)   What is the patient wearing?: Bra;Pull over shirt   Assist Level: Supervision/Verbal cueing    Lower Body Dressing (excluding footwear)   What is the patient wearing?: Underwear/pull up;Pants Assist for lower body dressing: Supervision/Verbal cueing    Putting on/Taking off footwear   What is the patient wearing?: [place] for footwear: Supervision/Verbal cueing       Care Tool Toileting Toileting activity   Assist for toileting: Contact Guard/Touching assist     Care Tool Bed Mobility Roll left and right activity   Roll left and right assist level: Supervision/Verbal cueing    Sit to lying activity   Sit to lying assist level: Supervision/Verbal cueing    Lying to sitting on side of bed activity   Lying to sitting on side of bed assist level: the ability to move from lying on the back to sitting on the side of the bed with no back support.: Supervision/Verbal cueing     Care Tool Transfers Sit to stand transfer   Sit to stand assist level: Contact Guard/Touching assist    Chair/bed transfer   Chair/bed transfer assist level: Minimal Assistance - Patient > 75%     Toilet transfer   Assist Level: Contact Guard/Touching assist     Care Tool Cognition  Expression of Ideas and Wants Expression of Ideas and Wants: 4. Without difficulty (complex and basic) - expresses complex messages without difficulty and with speech that is clear and easy to understand  Understanding  Verbal and Non-Verbal Content Understanding Verbal and Non-Verbal Content: 4. Understands (complex and basic) - clear comprehension without cues or repetitions   Memory/Recall Ability Memory/Recall Ability : Current season;That he or she is in a hospital/hospital unit   Refer to Care Plan for Long Term Goals  SHORT TERM GOAL WEEK 1 OT Short Term Goal 1 (Week 1): STGs=LTGs due to patient's length of stay.  Recommendations for other services: Therapeutic Recreation  Stress management   Skilled Therapeutic Intervention Pt received seated in [facility] for skilled OT session with focus on ADL retraining. Pt agreeable to interventions, despite demonstrating overall flat affect/mood. Pt reported 7/10 pain, declining to ask nursing for pain medication. OT offering intermediate rest breaks and positioning suggestions throughout session to address pain/fatigue and maximize participation/safety in session.  Session initiated with pt and daughter educated on the role of OT, OT POC, and rehab schedule. Pt performing STS, toilet, and shower transfers this session with overall SUP-CGA + RW. Pt completes full-body bathing/dressing with supervision + RW/grab bar. Pt requires cuing to wash soap off of chest, stating "it's fine", but compliant when prompted a second time. Pt using figure-4 technique to doff/don LB garments/socks seated on TTB/EOB with supervision for dynamic sitting balance.   Pt's daughter, Joycelyn Schmid, trained on A with toilet transfers, safety plan updated.   Pt remained seated in [facility] with all immediate needs met at end of session. Pt continues to be appropriate for skilled OT intervention to promote further functional independence.   ADL ADL Eating: Independent Where Assessed-Eating: Wheelchair Grooming: Supervision/safety Where Assessed-Grooming: Wheelchair Upper Body Bathing: Setup Where Assessed-Upper Body Bathing: Shower Lower Body Bathing: Setup Where Assessed-Lower Body Bathing:  Shower Upper Body Dressing: Supervision/safety Where Assessed-Upper Body Dressing: Edge of bed Lower Body Dressing: Supervision/safety Where Assessed-Lower Body Dressing: Edge of bed Toileting: Supervision/safety Where Assessed-Toileting: Glass blower/designer: Therapist, music Method: Counselling psychologist: Geophysical data processor: Curator Method: Heritage manager: Manufacturing systems engineer  Bed Mobility Bed Mobility: Supine to Sit;Sit to Supine Right Sidelying to Sit: Supervision/Verbal cueing Supine to Sit: Supervision/Verbal cueing Sit to Supine: Supervision/Verbal cueing Transfers Sit to Stand: Contact Guard/Touching assist Stand to Sit: Contact Guard/Touching assist   Discharge Criteria: Patient will be discharged from OT if patient refuses treatment 3 consecutive times without medical reason, if treatment goals not met, if there is a change in medical status, if patient makes no progress towards goals or if patient is discharged from hospital.  The above assessment, treatment plan, treatment alternatives and goals were discussed and mutually agreed upon: by patient  Alessandra Bevels 04/26/2022, 4:29 PM

## 2022-04-26 NOTE — Progress Notes (Signed)
+/-   sleep. Daughter stayed night. At [place], patient incontinent of large amount of urine. Patient unaware of being incontinent. Barrier cream applied to area on buttocks. Foam dressing applied after 2nd incontinent void. Continent x 1. Compliant with turning side to side. Several attempts OOB without assistance. Bed alarm in place to alert staff and remind patient to call for assistance. Reminded daughter to allow staff to assist patient to BR.Patrici Ranks A

## 2022-04-26 NOTE — Plan of Care (Signed)
  Problem: RH Bathing Goal: LTG Patient will bathe all body parts with assist levels (OT) Description: LTG: Patient will bathe all body parts with assist levels (OT) Flowsheets (Taken 04/26/2022 1626) LTG: Pt will perform bathing with assistance level/cueing: Independent with assistive device    Problem: RH Dressing Goal: LTG Patient will perform lower body dressing w/assist (OT) Description: LTG: Patient will perform lower body dressing with assist, with/without cues in positioning using equipment (OT) Flowsheets (Taken 04/26/2022 1626) LTG: Pt will perform lower body dressing with assistance level of: Independent with assistive device   Problem: RH Toileting Goal: LTG Patient will perform toileting task (3/3 steps) with assistance level (OT) Description: LTG: Patient will perform toileting task (3/3 steps) with assistance level (OT)  Flowsheets (Taken 04/26/2022 1626) LTG: Pt will perform toileting task (3/3 steps) with assistance level: Independent with assistive device   Problem: RH Simple Meal Prep Goal: LTG Patient will perform simple meal prep w/assist (OT) Description: LTG: Patient will perform simple meal prep with assistance, with/without cues (OT). Flowsheets (Taken 04/26/2022 1626) LTG: Pt will perform simple meal prep with assistance level of: Independent with assistive device   Problem: RH Laundry Goal: LTG Patient will perform laundry w/assist, cues (OT) Description: LTG: Patient will perform laundry with assistance, with/without cues (OT). Flowsheets (Taken 04/26/2022 1626) LTG: Pt will perform laundry with assistance level of: Independent with assistive device   Problem: RH Light Housekeeping Goal: LTG Patient will perform light housekeeping w/assist (OT) Description: LTG: Patient will perform light housekeeping with assistance, with/without cues (OT). Flowsheets (Taken 04/26/2022 1626) LTG: Pt will perform light housekeeping with assistance level of: Independent with assistive  device   Problem: RH Toilet Transfers Goal: LTG Patient will perform toilet transfers w/assist (OT) Description: LTG: Patient will perform toilet transfers with assist, with/without cues using equipment (OT) Flowsheets (Taken 04/26/2022 1626) LTG: Pt will perform toilet transfers with assistance level of: Independent with assistive device   Problem: RH Tub/Shower Transfers Goal: LTG Patient will perform tub/shower transfers w/assist (OT) Description: LTG: Patient will perform tub/shower transfers with assist, with/without cues using equipment (OT) Flowsheets (Taken 04/26/2022 1626) LTG: Pt will perform tub/shower stall transfers with assistance level of: Independent with assistive device

## 2022-04-26 NOTE — Progress Notes (Signed)
PROGRESS NOTE   Subjective/Complaints:  Pt reports no issues- slept well with blanket- room colder than she expected, but it's OK due to blanket.  Ate most of her breakfast.   Per nursing, incontinent of large amount of  urine- didn't realize it- had been using Purewick in acute, but not here.  Pt denies any pain this AM- including Chest wall/rib pain.  ROS:  Pt denies SOB, abd pain, CP, N/V/C/D, and vision changes  Objective:   DG CHEST PORT 1 VIEW  Result Date: 04/25/2022 CLINICAL DATA:  Shortness of breath.  Cardiac arrest. EXAM: PORTABLE CHEST 1 VIEW COMPARISON:  One-view chest x-ray 04/24/2022. CT angio chest 04/22/2022 FINDINGS: Heart is enlarged. Overall lung volumes have improved. Interstitial and airspace opacities are improving on the right. Areas of atelectasis are present on the left. Moderate pulmonary vascular congestion remains. IMPRESSION: 1. Improving interstitial and airspace opacities consistent with improving infection and edema. 2. Cardiomegaly and moderate pulmonary vascular congestion. Electronically Signed   By: San Morelle M.D.   On: 04/25/2022 10:23   Recent Labs    04/25/22 0123 04/26/22 0628  WBC 7.5 7.5  HGB 7.6* 8.3*  HCT 24.9* 27.6*  PLT 313 360   Recent Labs    04/25/22 0123 04/26/22 0628  NA 139 140  K 3.4* 3.7  CL 98 100  CO2 31 29  GLUCOSE 207* 86  BUN 16 14  CREATININE 1.01* 1.02*  CALCIUM 8.4* 8.7*    Intake/Output Summary (Last 24 hours) at 04/26/2022 0840 Last data filed at 04/26/2022 2355 Gross per 24 hour  Intake 880 ml  Output --  Net 880 ml     Pressure Injury 04/09/22 Sacrum Medial Stage 2 -  Partial thickness loss of dermis presenting as a shallow open injury with a red, pink wound bed without slough. open Blister to sacrum (Active)  04/09/22 1700  Location: Sacrum  Location Orientation: Medial  Staging: Stage 2 -  Partial thickness loss of dermis  presenting as a shallow open injury with a red, pink wound bed without slough.  Wound Description (Comments): open Blister to sacrum  Present on Admission: Yes    Physical Exam: Vital Signs Blood pressure 132/61, pulse 72, temperature 98.2 F (36.8 C), resp. rate 18, height '5\' 3"'$  (1.6 m), weight 71.2 kg, SpO2 100 %.    General: awake, alert, appropriate, laying supine in bed; curled up with her blanket from home; family at bedside asleep; NAD HENT: conjugate gaze; oropharynx a little dry CV: regular rate; no JVD Pulmonary: few rhonchi, but otherwise CTA B/L; good air movement GI: soft, NT, ND, (+)BS Psychiatric: appropriate- quiet, but is interactive Neurological: Ox3 Extremities;  No LE edema at all B/L Musculoskeletal:     Cervical back: Neck supple. No tenderness.     Comments: 5/5 in B/L UE/arms- biceps, triceps, WE, grip and FA 5-/5 in B/L LE's- HF, KE, KF, DF and PF 5/5 B/L  Skin:    General: Skin is warm and dry.     Comments: R forearm IV- looks OK Neck irritation as above No significant LE edema- maybe trace in feet/ankles B/L  Neurological:     Mental Status:  She is alert and oriented to person, place, and time.     Comments: Intact to light touch in all 4 extremities Ox3- able to answer 2-3 steps questions and follow 2 step commands  Assessment/Plan: 1. Functional deficits which require 3+ hours per day of interdisciplinary therapy in a comprehensive inpatient rehab setting. Physiatrist is providing close team supervision and 24 hour management of active medical problems listed below. Physiatrist and rehab team continue to assess barriers to discharge/monitor patient progress toward functional and medical goals  Care Tool:  Bathing              Bathing assist       Upper Body Dressing/Undressing Upper body dressing        Upper body assist      Lower Body Dressing/Undressing Lower body dressing            Lower body assist        Toileting Toileting    Toileting assist       Transfers Chair/bed transfer  Transfers assist           Locomotion Ambulation   Ambulation assist              Walk 10 feet activity   Assist           Walk 50 feet activity   Assist           Walk 150 feet activity   Assist           Walk 10 feet on uneven surface  activity   Assist           Wheelchair     Assist               Wheelchair 50 feet with 2 turns activity    Assist            Wheelchair 150 feet activity     Assist          Blood pressure 132/61, pulse 72, temperature 98.2 F (36.8 C), resp. rate 18, height '5\' 3"'$  (1.6 m), weight 71.2 kg, SpO2 100 %.  Medical Problem List and Plan: 1. Functional deficits secondary to debility from cardiac arrest with CPR >2 minutes-              -patient may  shower             -ELOS/Goals: 12-16 days- mod I to supervision  First day of evaluations- PT, OT and SLP just in case- con't CIR 2.  Antithrombotics: -DVT/anticoagulation:  Pharmaceutical: Eliquis              -antiplatelet therapy: none   3. Pain Management: Tylenol as needed             Sore ribs from CPR- might benefit from Lidoderm patches?  1/1- pt denies pain at all this AM-wait on changing regimen 4. Mood/Behavior/Sleep: LCSW to evaluate and provide emotional support             -antipsychotic agents: n/a   5. Neuropsych/cognition: This patient is capable of making decisions on her own behalf. Doesn't have signs of anoxia- but will order SLP for cognition just to make sure don't miss some mild issues   1/1- SLP eval 6. Skin/Wound Care: Routine skin care checks   7. Fluids/Electrolytes/Nutrition: routine Is and Os -carb modified diet -K phos 500 mg TID -Lasix continues for diffuse edema - edema almost resolved in legs- will follow IM  recs on when to stop Lasix- maybe earlier if improves. 1/1- No LE edema, however has moderate vascular  congestion on CXR- will wait to reduce Lasix.  8: Cardiac arrest, suspected PE s/p tenecteplase             -follow-up with Dr. Florene Glen   9: Seizure-like activity: Keppra discontinued   10: AKI: creatinine at baseline; follow-up BMP   1/1- Cr very slightly elevated at 1.02 and BUN 14- con't to monitor 11: Respiratory failure/pneumonia: completed antibiotics             -pulmonary toilet; albuterol nebs as needed             -Duoneb BID   12: Bilateral pleural effusions: repeat chest x-ray on 12/30>>improvement (see report above)             -diuresis continues with Lasix 40 mg daily   1/1- wait to reduce Lasix due to pulm vascular congestion 13: Anemia: Hemoglobin with downward trend: 7.6; recheck CBC    1/1- Hb up to 8.3-  14: Rib fractures: pain control   15: Hypertension: monitor TID and prn             continue  Lasix 40 mg daily             -continue Norvasc 7.5 mg daily   16: DM/DKA: Ca1c >15.5 BGs q AC and q HS; (home meds: Jardiance 25 mg daily, Toujeo 100 units daily, Humulin R U500 60 units TID with meals)              -SSI             -Novolog 8 units with meals             -Semglee 18 units BID   1/1- CBGs 91 to 181- MUCH better- con't regimen for now 17: Left upper lobe atelectasis: Concern for chronic obstruction of left upper lobe bronchus             -Recommend follow-up CT scan in 3 months versus other imaging   18: Hypokalemia: replete; follow-up BMP   1/1- K+ 3.7 19: Hyperlipidemia: Crestor   20: Hypomagnesemia: continue Mag-ox 400 mg daily; recheck mag 1/1   1/1- Mg 1.8- borderline low- will increase MgOx 21: Elevated TSH: T3, T4 pending   1/1- T4 is normal at 0.81- on lower side, but still in normal limits; however TSH is 9.8- will start 75 mcg Synthroid since has hx of TSH being elevated as well as borderline high in past as well- pt also c/o being cold all the time and dry skin/hair falling out.  22. Urine incontinence  1/1- will get timed voiding- due  to incontinence- likely due to using Purewick so long, but will monitor  I spent a total of  41  minutes on total care today- >50% coordination of care- due to d/w nursing about bladder, review of thyroid labs for 3 years; CXR and vascular congestion vs no LE edema.      LOS: 1 days A FACE TO FACE EVALUATION WAS PERFORMED  Fabion Gatson 04/26/2022, 8:40 AM

## 2022-04-26 NOTE — Progress Notes (Signed)
Physical Therapy Session Note  Patient Details  Name: OLIVIAGRACE CRISANTI MRN: [id number] Date of Birth: 1951-04-09  Today's Date: 04/26/2022 PT Individual Time: 1505-1530 PT Individual Time Calculation (min): 25 min   Short Term Goals: Week 1:  PT Short Term Goal 1 (Week 1): =LTGs d/t ELOS  Skilled Therapeutic Interventions/Progress Updates:      Therapy Documentation Precautions:  Restrictions Weight Bearing Restrictions: No  Pt agreeable to PT session with emphasis on gait. Pt without verbal complaints of pain in session. Pt participated in following outcome measures and following returned to room and left seated in w/c at bedside with daughter present. Pt daughter requested pt to not have chair alarm on.   6 Min Walk Test:  Instructed patient to ambulate as quickly and as safely as possible for 6 minutes using LRAD. Patient was allowed to take standing rest breaks without stopping the test, but if the patient required a sitting rest break the clock would be stopped and the test would be over.  Results: 155 feet (47.24 meters, Avg speed 0.44ms) using a RW with CGA. Results indicate that the patient has reduced endurance with ambulation compared to age matched norms.  Age Matched Norms: 633-71yo M: 547F: 522 765-79yo M: 545F: 471, 81-89yo M: 417 F: 392 MDC: 58.21 meters (190.98 feet) or 50 meters (ANPTA Core Set of Outcome Measures for Adults with Neurologic Conditions, 2018)    10 Meter Walk Test: Patient instructed to walk 10 meters (32.8 ft) as quickly and as safely as possible.   Average Fast speed: 0.50 m/s Cut off scores: <0.4 m/s = household Ambulator, 0.4-0.8 m/s = limited community Ambulator, >0.8 m/s = community Ambulator, >1.2 m/s = crossing a street, <1.0 = increased fall risk MCID 0.05 m/s (small), 0.13 m/s (moderate), 0.06 m/s (significant)  (ANPTA Core Set of Outcome Measures for Adults with Neurologic Conditions, 2018)  Five times Sit to Stand Test  (FTSS) Method: Use a straight back chair with a solid seat that is 16-18" high. Ask participant to sit on the chair with arms folded across their chest.   Instructions: "Stand up and sit down as quickly as possible 5 times, keeping your arms folded across your chest."   Measurement: Stop timing when the participant stands the 5th time.  TIME: 16. 6 (in seconds)  Times > 13.6 seconds is associated with increased disability and morbidity (Guralnik, 2000) Times > 15 seconds is predictive of recurrent falls in healthy individuals aged 664and older (Buatois, et al., 2008) Normal performance values in community dwelling individuals aged 6106and older (Bohannon, 2006): 60-69 years: 11.4 seconds 70-79 years: 12.6 seconds 80-89 years: 14.8 seconds  MCID: ? 2.3 seconds for Vestibular Disorders (Meretta, 2006)   Therapy/Group: Individual Therapy  SVerl DickerSVerl DickerPT, DPT  04/26/2022, 7:58 AM

## 2022-04-26 NOTE — Evaluation (Addendum)
Physical Therapy Assessment and Plan  Patient Details  Name: Deborah Neal MRN: [id number] Date of Birth: 1950-09-08  PT Diagnosis: Difficulty walking and Muscle weakness Rehab Potential: Good ELOS: 5-7 days   Today's Date: 04/26/2022 PT Individual Time: 0900-1015 PT Individual Time Calculation (min): 75 min    Hospital Problem: Principal Problem:   Debility Active Problems:   Cardiac arrest [facility])   Past Medical History:  Past Medical History:  Diagnosis Date   ALLERGIC RHINITIS 01/29/2007   Arthritis    ASTHMA 01/29/2007   ASTHMA, WITH ACUTE EXACERBATION 06/24/2008   BACK PAIN 01/29/2009   CHEST PAIN 06/24/2008   Family history of adverse reaction to anesthesia    Patients daughter has N/V after anesthesia   GERD (gastroesophageal reflux disease)    History of shingles    HYPERLIPIDEMIA 01/29/2007   HYPERTENSION 01/29/2007   OSTEOPENIA 10/16/2007   Type II or unspecified type diabetes mellitus without mention of complication, uncontrolled 11/29/2013   Past Surgical History:  Past Surgical History:  Procedure Laterality Date   CHOLECYSTECTOMY N/A 09/19/2015   Procedure: LAPAROSCOPIC CHOLECYSTECTOMY WITH INTRAOPERATIVE CHOLANGIOGRAM;  Surgeon: Jackolyn Confer, MD;  Location: [place];  Service: General;  Laterality: N/A;   COLONOSCOPY     TONSILLECTOMY  1956   VIDEO BRONCHOSCOPY Bilateral 03/28/2014   Procedure: VIDEO BRONCHOSCOPY WITHOUT FLUORO;  Surgeon: Tanda Rockers, MD;  Location: [facility];  Service: Cardiopulmonary;  Laterality: Bilateral;    Assessment & Plan Clinical Impression: Deborah Neal is a 72 year old female whose family attempted to contact her on the phone morning of 04/09/2022.  When she did not answer they found her at home unresponsive.  This was called and upon arrival she lost her pulse and CPR was initiated.  Demonstrated seizure-like activity on route to emergency department which improved with benzodiazepine administration.  She was intubated and admitted to  pulmonary critical care medicine.  Laboratory workup significant for lactic acidosis, acute kidney injury, elevated troponins and a hemoglobin A1c of greater than 15.5%.  Unable to do CTA of the chest due to kidney function.  She was initially started on heparin infusion due to concern for pulmonary embolism.  She subsequently became hypotensive and required Levophed then went into cardiac arrest with ventricular tachycardia/ventricular fibrillation.  ROSC was achieved after 2 minutes.  She was treated with tenecteplase.  Extremity ultrasound negative for DVT.  She was treated for acute hypoxic respiratory failure with multifocal pneumonia secondary to Serratia marcescens.  After renal recovery, she underwent CT angiogram which showed an intraluminal filling defect and a small subsegmental branch in the right lower lobe suggesting pulmonary embolism.  She was extubated on 12/23.  She is currently receiving cefepime IV which is to be completed 12/29.  Lasix continues for diffuse body wall edema.  Her EEG was suggestive of severe to profound diffuse encephalopathy and MRI of the brain was without acute intracranial abnormality.  On Keppra, discontinued on 12/29. Diabetic coordinator consulted for insulin management.  Per medication administration record, patient started on Eliquis and heparin infusion discontinued 12/29. Cardiology consulted on 12/29 and echocardiogram performed. EF estimated at 60-65%.  The patient requires inpatient physical medicine and rehabilitation evaluations and treatment secondary to dysfunction due to ability secondary to cardiac arrest, pneumonia.  Patient transferred to CIR on 04/25/2022 .   Patient currently requires min with mobility secondary to muscle weakness, decreased cardiorespiratoy endurance, decreased attention and delayed processing, and gait impairments .  Prior to hospitalization, patient was independent  with mobility and  lived with Other ([place]) (Grandaughter,  [place], lives with pt but works outside the home) in [employer] home.  Home access is 4Stairs to enter.  Patient will benefit from skilled PT intervention to maximize safe functional mobility, minimize fall risk, and decrease caregiver burden for planned discharge home with 24 hour supervision.  Anticipate patient will benefit from follow up [place] at discharge.  PT - End of Session Activity Tolerance: Tolerates 30+ min activity with multiple rests Endurance Deficit: Yes Endurance Deficit Description: pt fatigues quickly with gait PT Assessment Rehab Potential (ACUTE/IP ONLY): Good PT Barriers to Discharge: Inaccessible home environment;Home environment access/layout PT Patient demonstrates impairments in the following area(s): Balance;Edema;Safety;Endurance;Sensory;Motor;Skin Integrity PT Transfers Functional Problem(s): Bed Mobility;Bed to Chair;Car PT Locomotion Functional Problem(s): Ambulation;Stairs PT Plan PT Intensity: Minimum of 1-2 x/day ,45 to 90 minutes PT Frequency: 5 out of 7 days PT Duration Estimated Length of Stay: 5-7 days PT Treatment/Interventions: Ambulation/gait training;Cognitive remediation/compensation;Discharge planning;DME/adaptive equipment instruction;Functional mobility training;Pain management;Psychosocial support;Therapeutic Activities;Splinting/orthotics;UE/LE Strength taining/ROM;Visual/perceptual remediation/compensation;Wheelchair propulsion/positioning;UE/LE Coordination activities;Therapeutic Exercise;Stair training;Patient/family education;Skin care/wound management;Neuromuscular re-education;Functional electrical stimulation;Disease management/prevention;Community reintegration;Balance/vestibular training PT Transfers Anticipated Outcome(s): supervision PT Locomotion Anticipated Outcome(s): supervision gait PT Recommendation Follow Up Recommendations: Home health PT Patient destination: Home Equipment Recommended: Rolling walker with 5" wheels   PT  Evaluation Precautions/Restrictions Precautions Precautions: Fall Restrictions Weight Bearing Restrictions: No General   Vital Signs  Pain   Pain Interference Pain Interference Pain Effect on Sleep: 1. Rarely or not at all Pain Interference with Therapy Activities: 1. Rarely or not at all Pain Interference with Day-to-Day Activities: 1. Rarely or not at all Home Living/Prior Christian: Other relatives Available Help at Discharge: Family;Available 24 hours/day Joycelyn Schmid and [place] can provide 24/7 care) Type of Home: House Home Access: Stairs to enter [employer] of Steps: 4 Entrance Stairs-Rails: Right Home Layout: One level  Lives With: Other (Comment) (Grandaughter, [place], lives with pt but works outside the home) Prior Function Level of Independence: Independent with transfers  Able to Take Stairs?: Yes Driving: No Vocation: Retired Leisure: Hobbies-yes (Comment) (shopping) Vision/Perception  Vision - History Ability to See in Adequate Light: 0 Adequate Perception Perception: Within Functional Limits Praxis Praxis: Intact  Cognition Overall Cognitive Status: Within Functional Limits for tasks assessed Orientation Level: Oriented X4 Month: January Day of Week: Correct Memory: Impaired (occasionally impaired by pt's daughter report) Awareness: Appears intact Safety/Judgment: Appears intact Sensation Sensation Light Touch: Appears Intact Coordination Gross Motor Movements are Fluid and Coordinated: Yes Motor  Motor Motor: Within Functional Limits Motor - Skilled Clinical Observations: pt with some generalized weakness but otherwise [facility]   Trunk/Postural Assessment  Cervical Assessment Cervical Assessment: Within Functional Limits Thoracic Assessment Thoracic Assessment: Within Functional Limits Lumbar Assessment Lumbar Assessment: Within Functional Limits Postural Control Postural Control: Deficits on  evaluation  Balance Balance Balance Assessed: Yes Standardized Balance Assessment Standardized Balance Assessment: Timed Up and Go Test Timed Up and Go Test TUG: Normal TUG Normal TUG (seconds): 37.64 Static Standing Balance Static Standing - Balance Support: During functional activity Static Standing - Level of Assistance: 5: Stand by assistance Dynamic Standing Balance Dynamic Standing - Balance Support: During functional activity Dynamic Standing - Level of Assistance: 4: Min assist Extremity Assessment      RLE Assessment RLE Assessment: Exceptions to [facility] General Strength Comments: Grossly 4/5 LLE Assessment LLE Assessment: Exceptions to [facility] General Strength Comments: grossly 4/5  Care Tool Care Tool Bed Mobility Roll left and right activity   Roll left and right  assist level: Supervision/Verbal cueing    Sit to lying activity   Sit to lying assist level: Supervision/Verbal cueing    Lying to sitting on side of bed activity   Lying to sitting on side of bed assist level: the ability to move from lying on the back to sitting on the side of the bed with no back support.: Supervision/Verbal cueing     Care Tool Transfers Sit to stand transfer   Sit to stand assist level: Contact Guard/Touching assist    Chair/bed transfer   Chair/bed transfer assist level: Minimal Assistance - Patient > 75%     Physiological scientist transfer assist level: Minimal Assistance - Patient > 75%      Care Tool Locomotion Ambulation   Assist level: Minimal Assistance - Patient > 75% Assistive device: Walker-rolling Max distance: 200 ft  Walk 10 feet activity   Assist level: Minimal Assistance - Patient > 75% Assistive device: Walker-rolling   Walk 50 feet with 2 turns activity   Assist level: Minimal Assistance - Patient > 75% Assistive device: Walker-rolling  Walk 150 feet activity   Assist level: Minimal Assistance - Patient > 75% Assistive device:  Walker-rolling  Walk 10 feet on uneven surfaces activity   Assist level: Moderate Assistance - Patient - 50 - 74% Assistive device: Walker-rolling  Stairs   Assist level: Minimal Assistance - Patient > 75% Stairs assistive device: 2 hand rails Max number of stairs: 8  Walk up/down 1 step activity   Walk up/down 1 step (curb) assist level: Minimal Assistance - Patient > 75% Walk up/down 1 step or curb assistive device: 2 hand rails  Walk up/down 4 steps activity   Walk up/down 4 steps assist level: Minimal Assistance - Patient > 75% Walk up/down 4 steps assistive device: 2 hand rails  Walk up/down 12 steps activity   Walk up/down 12 steps assist level: Minimal Assistance - Patient > 75% Walk up/down 12 steps assistive device: 2 hand rails  Pick up small objects from floor   Pick up small object from the floor assist level: Minimal Assistance - Patient > 75%    Wheelchair Is the patient using a wheelchair?: No (transport for time management only)          Wheel 50 feet with 2 turns activity      Wheel 150 feet activity        Refer to Care Plan for Long Term Goals  [place] 1 PT Short Term Goal 1 (Week 1): =LTGs d/t ELOS  Recommendations for other services: None   Skilled Therapeutic Intervention Evaluation completed (see details above) with patient education regarding purpose of PT evaluation, PT POC and goals, therapy schedule, weekly team meetings, and other CIR information including safety plan and fall risk safety. History taken from pt and daughter, MMT and sensory testing performed in sitting. Pt participated in session requiring rest breaks for fatigue.  Pt performed the below functional mobility tasks with the specified levels of skilled cuing and assistance.  Pt requested to use bathroom during session, and did so with CGA overall for with min A for clothing management. Pt returned to bed after session and was left with all needs in reach and alarm active.    Pt performed timed up and go as follows: 34.88, 34.37, 43.67=37.64 sec  Five times Sit to Stand Test (FTSS) Method: Use a straight back chair with a solid  seat that is 16-18" high. Ask participant to sit on the chair with arms folded across their chest.   Instructions: "Stand up and sit down as quickly as possible 5 times, keeping your arms folded across your chest."   Measurement: Stop timing when the participant stands the 5th time.  TIME: ___53.03___ (in seconds)  Times > 13.6 seconds is associated with increased disability and morbidity (Guralnik, 2000) Times > 15 seconds is predictive of recurrent falls in healthy individuals aged 56 and older (Buatois, et al., 2008) Normal performance values in community dwelling individuals aged 63 and older (Bohannon, 2006): 60-69 years: 11.4 seconds 70-79 years: 12.6 seconds 80-89 years: 14.8 seconds  MCID: ? 2.3 seconds for Vestibular Disorders (Meretta, 2006)   Mobility Bed Mobility Bed Mobility: Supine to Sit;Sit to Supine Right Sidelying to Sit: Supervision/Verbal cueing Supine to Sit: Supervision/Verbal cueing Sit to Supine: Supervision/Verbal cueing Transfers Transfers: Sit to Stand;Stand Pivot Transfers Sit to Stand: Contact Guard/Touching assist Stand Pivot Transfers: Minimal Assistance - Patient > 75% Stand Pivot Transfer Details: Verbal cues for safe use of DME/AE;Verbal cues for precautions/safety;Verbal cues for sequencing;Visual cues for safe use of DME/AE Transfer (Assistive device): Rolling walker Locomotion  Gait Ambulation: Yes Gait Assistance: Minimal Assistance - Patient > 75% Gait Distance (Feet): 200 Feet Assistive device: Rolling walker Gait Assistance Details: Verbal cues for precautions/safety;Verbal cues for safe use of DME/AE;Verbal cues for sequencing;Visual cues for safe use of DME/AE Gait Gait: Yes Gait Pattern: Impaired Gait Pattern: Decreased stride length;Poor foot clearance - left;Poor foot  clearance - right;Trunk flexed Gait velocity: decreased Stairs / Additional Locomotion Stairs: Yes Stairs Assistance: Minimal Assistance - Patient > 75% Stair Management Technique: Two rails Number of Stairs: 8 Height of Stairs: 6 Ramp: Minimal Assistance - Patient >75% Wheelchair Mobility Wheelchair Mobility: No   Discharge Criteria: Patient will be discharged from PT if patient refuses treatment 3 consecutive times without medical reason, if treatment goals not met, if there is a change in medical status, if patient makes no progress towards goals or if patient is discharged from hospital.  The above assessment, treatment plan, treatment alternatives and goals were discussed and mutually agreed upon: by patient  Mickel Fuchs 04/26/2022, 1:01 PM

## 2022-04-26 NOTE — Progress Notes (Signed)
[facility] Individual Statement of Services  Patient Name:  Deborah Neal  Date:  04/26/2022  Welcome to the [facility].  Our goal is to provide you with an individualized program based on your diagnosis and situation, designed to meet your specific needs.  With this comprehensive rehabilitation program, you will be expected to participate in at least 3 hours of rehabilitation therapies Monday-Friday, with modified therapy programming on the weekends.  Your rehabilitation program will include the following services:  Physical Therapy (PT), Occupational Therapy (OT), Speech Therapy (ST), 24 hour per day rehabilitation nursing, Therapeutic Recreaction (TR), Care Coordinator, Rehabilitation Medicine, Nutrition Services, and Pharmacy Services  Weekly team conferences will be held on Tuesday to discuss your progress.  Your Inpatient Rehabilitation Care Coordinator will talk with you frequently to get your input and to update you on team discussions.  Team conferences with you and your family in attendance may also be held.  Expected length of stay: 5-7 days  Overall anticipated outcome: supervision-independent level  Depending on your progress and recovery, your program may change. Your Inpatient Rehabilitation Care Coordinator will coordinate services and will keep you informed of any changes. Your Inpatient Rehabilitation Care Coordinator's name and contact numbers are listed  below.  The following services may also be recommended but are not provided by the [place]:   [place] will be made to provide these services after discharge if needed.  Arrangements include referral to agencies that provide these services.  Your insurance has been verified to be:  [employer] Your primary doctor is:  [name]  Pertinent information will be shared with your  doctor and your insurance company.  Inpatient Rehabilitation Care Coordinator:  Ovidio Kin, [place] or Emilia Beck  Information discussed with and copy given to patient by: [name], 04/26/2022, 11:20 AM

## 2022-04-26 NOTE — Progress Notes (Signed)
Inpatient Rehabilitation Care Coordinator Assessment and Plan Patient Details  Name: Deborah Neal MRN: [id number] Date of Birth: 1951-01-07  Today's Date: 04/26/2022  Hospital Problems: Principal Problem:   Debility Active Problems:   Cardiac arrest [facility])  Past Medical History:  Past Medical History:  Diagnosis Date   ALLERGIC RHINITIS 01/29/2007   Arthritis    ASTHMA 01/29/2007   ASTHMA, WITH ACUTE EXACERBATION 06/24/2008   BACK PAIN 01/29/2009   CHEST PAIN 06/24/2008   Family history of adverse reaction to anesthesia    Patients daughter has N/V after anesthesia   GERD (gastroesophageal reflux disease)    History of shingles    HYPERLIPIDEMIA 01/29/2007   HYPERTENSION 01/29/2007   OSTEOPENIA 10/16/2007   Type II or unspecified type diabetes mellitus without mention of complication, uncontrolled 11/29/2013   Past Surgical History:  Past Surgical History:  Procedure Laterality Date   CHOLECYSTECTOMY N/A 09/19/2015   Procedure: LAPAROSCOPIC CHOLECYSTECTOMY WITH INTRAOPERATIVE CHOLANGIOGRAM;  Surgeon: Jackolyn Confer, MD;  Location: [place];  Service: General;  Laterality: N/A;   COLONOSCOPY     TONSILLECTOMY  1956   VIDEO BRONCHOSCOPY Bilateral 03/28/2014   Procedure: VIDEO BRONCHOSCOPY WITHOUT FLUORO;  Surgeon: Tanda Rockers, MD;  Location[facility];  Service: Cardiopulmonary;  Laterality: Bilateral;   Social History:  reports that she quit smoking about 18 years ago. Her smoking use included cigarettes. She has never used smokeless tobacco. She reports that she does not drink alcohol and does not use drugs.  Family / Support Systems Marital Status: Single Patient Roles: Parent, Other (Comment) (church member) Children: Margaret-daughter [phone]  Patricia-daughter [phone] Sandy-daughter [phone] Other Supports: Victoria-granddaughter lives with pt Anticipated Caregiver: Joycelyn Schmid and [place] Ability/Limitations of Caregiver: Both work but will work out a plan for someone to be  with 24/7 for short time Caregiver Availability: 24/7 Family Dynamics: Close with three daughter's and granddaughter. Pt has friends and church members who are supportive and involved  Social History Preferred language: English Religion: Baptist Cultural Background: No issues Education: HS Health Literacy - How often do you need to have someone help you when you read instructions, pamphlets, or other written material from your doctor or pharmacy?: Never Writes: Yes Employment Status: Retired Public relations account executive Issues: No issues Guardian/Conservator: None-according to MD pt is capable of making her own decisions while here. Family plans to be here daily to provide support   Abuse/Neglect Abuse/Neglect Assessment Can Be Completed: Yes Physical Abuse: Denies Verbal Abuse: Denies Sexual Abuse: Denies Exploitation of patient/patient's resources: Denies Self-Neglect: Denies  Patient response to: Social Isolation - How often do you feel lonely or isolated from those around you?: Rarely  Emotional Status Pt's affect, behavior and adjustment status: Pt is motivated to do well and is exhausted from therapies this am. Daughter is present in her room and very supportive. Both report she was very independent prior to this and hopes to get back to this level again once recovered. Recent Psychosocial Issues: other health issues managed got sick so fast Psychiatric History: No issues may benefit from seeing neuro-psych while here for coping Substance Abuse History: No issues  Patient / Family Perceptions, Expectations & Goals Pt/Family understanding of illness & functional limitations: Pt and daughter can explain her health issues and treatment plan moving forward. Both are vry pleased with her progress and fast recovery and hope it continues. Both realize how ill pt was and grateful she is doing so well. Premorbid pt/family roles/activities: mom, grandmother, church member, friend,  etc Anticipated  changes in roles/activities/participation: resume Pt/family expectations/goals: Pt states: " I hope to continue my progress here."  Daughter states: " I am so glad she is doing well and recovering from this."  [employer]: None Premorbid Home Care/DME Agencies: None Transportation available at discharge: daughter's transport pt does not drive Is the patient able to respond to transportation needs?: Yes In the past 12 months, has lack of transportation kept you from medical appointments or from getting medications?: No In the past 12 months, has lack of transportation kept you from meetings, work, or from getting things needed for daily living?: No Resource referrals recommended: Neuropsychology  Discharge Planning Living Arrangements: Other relatives Support Systems: Children, Other relatives, Friends/neighbors, Social worker community Type of Residence: Private residence Insurance Resources: Multimedia programmer (specify) ([place] Medicare) Financial Resources: Social Security Financial Screen Referred: No Living Expenses: Own Money Management: Patient Does the patient have any problems obtaining your medications?: No Home Management: self Patient/Family Preliminary Plans: Return home with granddaughter who lives with her and her daughter's. Aware atleast short term will need 24/7 supervision/care. Will await therapy evaluations and work on discharge needs. Care Coordinator Barriers to Discharge: Insurance for SNF coverage Care Coordinator Anticipated Follow Up Needs: HH/OP  Clinical Impression Pleasant female who is very lucky she is doing so well and recovering from her health emergency. Her daughter is present and very pleased with her progress just today. Family is very involved and will assist pt at discharge. Will await therapy evaluations and work on discharge needs.  [name] 04/26/2022, 11:18 AM

## 2022-04-27 DIAGNOSIS — R5381 Other malaise: Secondary | ICD-10-CM | POA: Diagnosis not present

## 2022-04-27 LAB — GLUCOSE, CAPILLARY
Glucose-Capillary: 166 mg/dL — ABNORMAL HIGH (ref 70–99)
Glucose-Capillary: 111 mg/dL — ABNORMAL HIGH (ref 70–99)
Glucose-Capillary: 149 mg/dL — ABNORMAL HIGH (ref 70–99)
Glucose-Capillary: 64 mg/dL — ABNORMAL LOW (ref 70–99)
Glucose-Capillary: 90 mg/dL (ref 70–99)

## 2022-04-27 MED ORDER — MAGNESIUM OXIDE -MG SUPPLEMENT 400 (240 MG) MG PO TABS
400.0000 mg | ORAL_TABLET | Freq: Two times a day (BID) | ORAL | Status: DC
  Administered 2022-04-27 – 2022-05-04 (×14): 400 mg via ORAL
  Filled 2022-04-27 (×14): qty 1

## 2022-04-27 MED ORDER — TRAZODONE HCL 50 MG PO TABS
25.0000 mg | ORAL_TABLET | Freq: Every evening | ORAL | Status: DC | PRN
  Administered 2022-04-27 – 2022-05-03 (×5): 25 mg via ORAL
  Filled 2022-04-27 (×5): qty 1

## 2022-04-27 MED ORDER — LIDOCAINE 5 % EX PTCH
3.0000 | MEDICATED_PATCH | CUTANEOUS | Status: DC
  Administered 2022-04-27 – 2022-05-03 (×6): 3 via TRANSDERMAL
  Filled 2022-04-27 (×7): qty 3

## 2022-04-27 NOTE — Plan of Care (Signed)
  Problem: RH Problem Solving Goal: LTG Patient will demonstrate problem solving for (SLP) Description: LTG:  Patient will demonstrate problem solving for basic/complex daily situations with cues  (SLP) Flowsheets (Taken 04/27/2022 1144) LTG: Patient will demonstrate problem solving for (SLP): Complex daily situations LTG Patient will demonstrate problem solving for: Supervision   Problem: RH Memory Goal: LTG Patient will use memory compensatory aids to (SLP) Description: LTG:  Patient will use memory compensatory aids to recall biographical/new, daily complex information with cues (SLP) Flowsheets (Taken 04/27/2022 1144) LTG: Patient will use memory compensatory aids to (SLP): Supervision   Problem: RH Attention Goal: LTG Patient will demonstrate this level of attention during functional activites (SLP) Description: LTG:  Patient will will demonstrate this level of attention during functional activites (SLP) Flowsheets (Taken 04/27/2022 1144) Patient will demonstrate during cognitive/linguistic activities the attention type of: Selective LTG: Patient will demonstrate this level of attention during cognitive/linguistic activities with assistance of (SLP): Supervision   Problem: RH Awareness Goal: LTG: Patient will demonstrate awareness during functional activites type of (SLP) Description: LTG: Patient will demonstrate awareness during functional activites type of (SLP) Flowsheets (Taken 04/27/2022 1144) Patient will demonstrate during cognitive/linguistic activities awareness type of: Emergent LTG: Patient will demonstrate awareness during cognitive/linguistic activities with assistance of (SLP): Supervision

## 2022-04-27 NOTE — Evaluation (Signed)
Speech Language Pathology Assessment and Plan  Patient Details  Name: Deborah Neal MRN: [id number] Date of Birth: 1950/07/04  SLP Diagnosis: Cognitive Impairments  Rehab Potential: Excellent ELOS: 7-10 days    Today's Date: 04/27/2022 SLP Individual Time: 9163-8466 SLP Individual Time Calculation (min): 40 min   Hospital Problem: Principal Problem:   Debility Active Problems:   Cardiac arrest [facility])  Past Medical History:  Past Medical History:  Diagnosis Date   ALLERGIC RHINITIS 01/29/2007   Arthritis    ASTHMA 01/29/2007   ASTHMA, WITH ACUTE EXACERBATION 06/24/2008   BACK PAIN 01/29/2009   CHEST PAIN 06/24/2008   Family history of adverse reaction to anesthesia    Patients daughter has N/V after anesthesia   GERD (gastroesophageal reflux disease)    History of shingles    HYPERLIPIDEMIA 01/29/2007   HYPERTENSION 01/29/2007   OSTEOPENIA 10/16/2007   Type II or unspecified type diabetes mellitus without mention of complication, uncontrolled 11/29/2013   Past Surgical History:  Past Surgical History:  Procedure Laterality Date   CHOLECYSTECTOMY N/A 09/19/2015   Procedure: LAPAROSCOPIC CHOLECYSTECTOMY WITH INTRAOPERATIVE CHOLANGIOGRAM;  Surgeon: Jackolyn Confer, MD;  Location: [facility];  Service: General;  Laterality: N/A;   COLONOSCOPY     TONSILLECTOMY  1956   VIDEO BRONCHOSCOPY Bilateral 03/28/2014   Procedure: VIDEO BRONCHOSCOPY WITHOUT FLUORO;  Surgeon: Tanda Rockers, MD;  Location: [facility];  Service: Cardiopulmonary;  Laterality: Bilateral;    Assessment / Plan / Recommendation Clinical Impression Patient is a 72 year old female with history of asthma, HTN, HLD and type 2 DM presented on 04/09/22 after she was found unresponsive at home. Witnessed tonic-clonic seizure followed by lost pulse and intubated. VFib/Vtach arrest felt secondary of severe hypokalemia. Presumed massive PE s/p TNKase. Eventually extubated 12/23. Placed on Cefepime for aspiration PNA with bilateral  pleural effusions and serratia marcescens PNA.  AKI with creatinine peaked at 5.64. Improving and to monitor on lasix. Diffuse body wall edema noted on CT 12/19. EEG findings suggestive of severe to profound diffuse encephalopathy, no seizures or epileptiform discharged. Currently on Keppra. MRI brain negative.  Therapy evaluations completed with recommendations for CIR. Patient admitted 04/25/22.  Upon arrival, patient was awake while upright in the wheelchair. Patient appeared mildly lethargic throughout evaluation and kept her head down when not engaged in conversation or evaluation which the patient and her daughter report is not baseline. Despite suspected fatigue, patient's auditory comprehension and verbal expression appeared [facility] with tasks assessed. SLP facilitated session by administering the Everly Status Examination (SLUMS). Patient scored  20/30 points with a score of 27 or above considered normal with deficits in recall, problem solving, and attention.  Due to patient's high-level of independence at baseline, recommend skilled SLP intervention to maximize her cognitive functioning and overall functional independence prior to discharge.      Skilled Therapeutic Interventions          Administered a cognitive-linguistic evaluation, please see above for details.   SLP Assessment  Patient will need skilled [facility] Services during CIR admission    Recommendations  Oral Care Recommendations: Oral care BID Recommendations for Other Services: Neuropsych consult Patient destination: Home Follow up Recommendations: 24 hour supervision/assistance;Outpatient SLP Equipment Recommended: None recommended by SLP    SLP Frequency 3 to 5 out of 7 days   SLP Duration  SLP Intensity  SLP Treatment/Interventions 7-10 days  Minumum of 1-2 x/day, 30 to 90 minutes  Cognitive remediation/compensation;Cueing hierarchy;Internal/external aids;Environmental  controls;Therapeutic  Activities;Functional tasks;Patient/family education    Pain Intermittent pain in RUE, patient repositioned   SLP Evaluation Cognition Overall Cognitive Status: Impaired/Different from baseline Arousal/Alertness: Lethargic Orientation Level: Oriented X4 Attention: Selective Selective Attention: Impaired Selective Attention Impairment: Functional basic Memory: Impaired Memory Impairment: Decreased recall of new information Awareness: Impaired Awareness Impairment: Emergent impairment Problem Solving: Impaired Problem Solving Impairment: Functional complex Safety/Judgment: Impaired  Comprehension Auditory Comprehension Overall Auditory Comprehension: Appears within functional limits for tasks assessed Expression Expression Primary Mode of Expression: Verbal Verbal Expression Overall Verbal Expression: Appears within functional limits for tasks assessed Written Expression Dominant Hand: Right Written Expression: Not tested Oral Motor Oral Motor/Sensory Function Overall Oral Motor/Sensory Function: Within functional limits Motor Speech Overall Motor Speech: Appears within functional limits for tasks assessed  Care Tool Care Tool Cognition Ability to hear (with hearing aid or hearing appliances if normally used Ability to hear (with hearing aid or hearing appliances if normally used): 0. Adequate - no difficulty in normal conservation, social interaction, listening to TV   Expression of Ideas and Wants Expression of Ideas and Wants: 4. Without difficulty (complex and basic) - expresses complex messages without difficulty and with speech that is clear and easy to understand   Understanding Verbal and Non-Verbal Content Understanding Verbal and Non-Verbal Content: 4. Understands (complex and basic) - clear comprehension without cues or repetitions  Memory/Recall Ability Memory/Recall Ability : Current season;That he or she is in a hospital/hospital unit     Short Term Goals: Week 1: SLP Short Term Goal 1 (Week 1): STGs=LTGs due to ELOS  Refer to Care Plan for Long Term Goals  Recommendations for other services: Neuropsych  Discharge Criteria: Patient will be discharged from SLP if patient refuses treatment 3 consecutive times without medical reason, if treatment goals not met, if there is a change in medical status, if patient makes no progress towards goals or if patient is discharged from hospital.  The above assessment, treatment plan, treatment alternatives and goals were discussed and mutually agreed upon: by patient  Yamil Dougher 04/27/2022, 11:43 AM

## 2022-04-27 NOTE — Discharge Summary (Signed)
Physician Discharge Summary  Patient ID: KWEEN BACORN MRN: [id number] DOB/AGE: [birth year] 72 y.o.  Admit date: 04/25/2022 Discharge date: 05/04/2022  Discharge Diagnoses:  Principal Problem:   Debility Active Problems:   Cardiac arrest [facility]) Lower extremity edema Seizure-like activity Acute kidney injury Respiratory failure Pneumonia Bilateral pleural effusions Anemia Hypertension Diabetes mellitus Left upper lobe atelectasis Hypokalemia Hyperlipidemia Hypomagnesemia Elevated TSH Urinary incontinence Sacral pressure injury  Discharged Condition: {condition:18240}  Significant Diagnostic Studies: DG CHEST PORT 1 VIEW  Result Date: 04/25/2022 CLINICAL DATA:  Shortness of breath.  Cardiac arrest. EXAM: PORTABLE CHEST 1 VIEW COMPARISON:  One-view chest x-ray 04/24/2022. CT angio chest 04/22/2022 FINDINGS: Heart is enlarged. Overall lung volumes have improved. Interstitial and airspace opacities are improving on the right. Areas of atelectasis are present on the left. Moderate pulmonary vascular congestion remains. IMPRESSION: 1. Improving interstitial and airspace opacities consistent with improving infection and edema. 2. Cardiomegaly and moderate pulmonary vascular congestion. Electronically Signed   By: San Morelle M.D.   On: 04/25/2022 10:23    Labs:  Basic Metabolic Panel: Recent Labs  Lab 04/24/22 0937 04/25/22 0123 04/26/22 0628 04/29/22 0523  NA 141 139 140 138  K 3.1* 3.4* 3.7 3.7  CL 98 98 100 102  CO2 '30 31 29 27  '$ GLUCOSE 233* 207* 86 242*  BUN '12 16 14 22  '$ CREATININE 1.06* 1.01* 1.02* 1.02*  CALCIUM 8.9 8.4* 8.7* 8.7*  MG 1.4* 2.2 1.8 1.8    CBC: Recent Labs  Lab 04/24/22 0937 04/25/22 0123 04/26/22 0628  WBC 7.6 7.5 7.5  NEUTROABS  --   --  5.5  HGB 8.5* 7.6* 8.3*  HCT 28.2* 24.9* 27.6*  MCV 76.0* 75.9* 76.7*  PLT 310 313 360    CBG: Recent Labs  Lab 04/29/22 1133 04/29/22 1645 04/29/22 2111 04/30/22 0652 04/30/22 1132   GLUCAP 161* 97 192* 203* 90    Brief HPI:   Deborah Neal is a 72 y.o. female  whose family attempted to contact her on the phone morning of 04/09/2022.  When she did not answer they found her at home unresponsive.  This was called and upon arrival she lost her pulse and CPR was initiated.  Demonstrated seizure-like activity on route to emergency department which improved with benzodiazepine administration.  She was intubated and admitted to pulmonary critical care medicine.  Laboratory workup significant for lactic acidosis, acute kidney injury, elevated troponins and a hemoglobin A1c of greater than 15.5%.  Unable to do CTA of the chest due to kidney function.  She was initially started on heparin infusion due to concern for pulmonary embolism.  She subsequently became hypotensive and required Levophed then went into cardiac arrest with ventricular tachycardia/ventricular fibrillation.  ROSC was achieved after 2 minutes.  She was treated with tenecteplase.  Extremity ultrasound negative for DVT.  She was treated for acute hypoxic respiratory failure with multifocal pneumonia secondary to Serratia marcescens.  After renal recovery, she underwent CT angiogram which showed an intraluminal filling defect and a small subsegmental branch in the right lower lobe suggesting pulmonary embolism.  She was extubated on 12/23.  She is currently receiving cefepime IV which is to be completed 12/29.  Lasix continues for diffuse body wall edema.  Her EEG was suggestive of severe to profound diffuse encephalopathy and MRI of the brain was without acute intracranial abnormality.  On Keppra, discontinued on 12/29. Diabetic coordinator consulted for insulin management.  Per medication administration record, patient started on Eliquis and heparin  infusion discontinued 12/29. Cardiology consulted on 12/29 and echocardiogram performed. EF estimated at 60-65%.     Hospital Course: KIARALIZ RAFUSE was admitted to rehab 04/25/2022 for  inpatient therapies to consist of PT, ST and OT at least three hours five days a week. Past admission physiatrist, therapy team and rehab RN have worked together to provide customized collaborative inpatient rehab.  Incontinent of urine 1/1.  Timed voids ordered.  Moderate vascular congestion on chest x-ray therefore Lasix 40 mg daily continued.  Speech therapy evaluation>> slums score 20/30 with a score of 27 or above consider normal with deficits in recall, problem solving and attention.  SLP intervention ongoing.  Hemoglobin improved to 8.3 on follow-up labs.  Serum magnesium 1.8 and magnesium oxide increased.  Synthroid 75 mcg initiated on 1/1 for TSH of 9.8. Trazodone helpful for insomnia versus melatonin. Follow-up chest x-ray improved and Lasix dose lowered to 20 mg daily on 1/4. Complaining of chest wall pain and given Norco as needed 1/5.  Blood pressures were monitored on TID basis and Lasix 40 milligrams daily and Norvasc 7.5 mg daily continued.  Diabetes has been monitored with ac/hs CBG checks and SSI was use prn for tighter BS control.  NovoLog 8 units with meals continued.  Semglee 18 units twice daily given. Semglee reduced to 16 units BID on 1/3.    Rehab course: During patient's stay in rehab weekly team conferences were held to monitor patient's progress, set goals and discuss barriers to discharge. At admission, patient required min assist with mobility and CGA-SUP with basic self-care skills.   She has had improvement in activity tolerance, balance, postural control as well as ability to compensate for deficits. She has had improvement in functional use RUE/LUE  and RLE/LLE as well as improvement in awareness     Disposition:  There are no questions and answers to display.       Diet: carb modified  Special Instructions: No driving, alcohol consumption or tobacco use.  Recommend follow-up CT chest scan versus other imaging for left upper lobe.   Allergies as of  04/30/2022       Reactions   Shingrix [zoster Vac Recomb Adjuvanted]    Allergy to original live zoster - rash     Med Rec must be completed prior to using this [facility]***       Follow-up Information     Biagio Borg, MD Follow up.   Specialties: Internal Medicine, Radiology Why: Call office in 1-2 days to make arrangments for hospital follow-up appointment Contact information: [place] [address] [phone]         Burnell Blanks, MD Follow up.   Specialty: Cardiology Why: Call office in 1-2 days to make arrangments for hospital consultation follow-up appointment Contact information: [place] [place] [address] [phone]         Courtney Heys, MD Follow up.   Specialty: Physical Medicine and Rehabilitation Why: As needed, office will call you to arrange your appt (sent) Contact information: [address] [place] [address] [phone]                 Signed: Barbie Banner 04/30/2022, 3:10 PM

## 2022-04-27 NOTE — Patient Care Conference (Signed)
Inpatient RehabilitationTeam Conference and Plan of Care Update Date: 04/27/2022   Time: 11:36 AM  Patient Name: Deborah Neal      Medical Record Number: [id number]  Date of Birth: 04-14-1951 Sex: Female         Room/Bed: 4U98J/1B14N-82 Payor Info: Payor: [employer] MEDICARE / Plan: [employer] MEDICARE CHOICE PPO / Product Type: *No Product type* /    Admit Date/Time:  04/25/2022  4:14 PM  Primary Diagnosis:  [place] [facility] Problems: Principal Problem:   Debility Active Problems:   Cardiac arrest [facility])    Expected Discharge Date: Expected Discharge Date: 05/04/22  Team Members Present: Physician leading conference: Dr. Courtney Heys Social Worker Present: Ovidio Kin, LCSW Nurse Present: Tacy Learn, RN PT Present: Verl Dicker, PT OT Present: Jamey Ripa, OT SLP Present: Weston Anna, SLP PPS Coordinator present : Gunnar Fusi, SLP     Current Status/Progress Goal Weekly Team Focus  Bowel/Bladder   Continent of b/b. Last BM 1/1.    Remain continent B/B  Toilet Q 3/4 hours and PRN    Swallow/Nutrition/ Hydration               ADL's   Pt overall CGA-SUP for ADLs and functional mobility.   Mod I   Continue to assess cognition and safety awareness. Focus on activity tolerance and IADL participation.    Mobility   (S) bed mobility, CGA with gait >200 ft, stairs x 8,   Supervision  activity tolerance, gait speed, stair training, pt/fam education    Communication                Safety/Cognition/ Behavioral Observations  Min A   Supervision   functinal problem solving, selective attention, recall    Pain   Pain to left shoulder 8/10. Tylenol given prn.   Pain less than 2.   Assess pain q shift prn.    Skin   Stage 2 buttock area. Foam dressing.   No new skin breakdown.  Assess skin q shift prn.      Discharge Planning:  Home with granddaughter and daughter's assisting and providing supervision. Family very involved and here daily   Team  Discussion: Debility. Continent of bowel. Incontinent of urine. Time toileting added. 7/10 chest wall pain. Lidoderm patch added. Trazodone added. Stage II to sacrum. CGA/supervision with OT/PT.  Selective attention and delay in processing. A1C greater than 15 Patient on target to meet rehab goals: yes, gait CGA greater than 200 ft and stairs x 8  *See Care Plan and progress notes for long and short-term goals.   Revisions to Treatment Plan:  Medication adjustments, time toileting   Teaching Needs: Medications, safety, gait/transfer training, skin/wound care, etc.   Current Barriers to Discharge: Decreased caregiver support, Home enviroment access/layout, Incontinence, Wound care, Lack of/limited family support, and Medication compliance  Possible Resolutions to Barriers: Family education, nursing education, order recommended DME     Medical Summary Current Status: d/c IV- not using- under reporter- but appears to have chest wall pain- 7/10- stage II sacrum-  Barriers to Discharge: Behavior/Mood;Cardiac Complications;Morbid Obesity;Medical stability;Self-care education;Uncontrolled Pain;Weight bearing restrictions;Incontinence;Uncontrolled Diabetes  Barriers to Discharge Comments: stage II on sacrum- supervision goals- secondary to cognition- poor insight/a little "off"- mainly impairs function- CGA right now- Possible Resolutions to [employer] Focus: needs 3 in 1 and tub transfer bench- SLP eval- keeps head down a lot- memory OK, but attention/delayed processing and problem solving now- SLp to pick her up- add lidoderm for chest wall pain;  and trazodone 25 mg QHS prn- d/c 11/9   Continued Need for Acute Rehabilitation Level of Care: The patient requires daily medical management by a physician with specialized training in physical medicine and rehabilitation for the following reasons: Direction of a multidisciplinary physical rehabilitation program to maximize functional independence :  Yes Medical management of patient stability for increased activity during participation in an intensive rehabilitation regime.: Yes Analysis of laboratory values and/or radiology reports with any subsequent need for medication adjustment and/or medical intervention. : Yes   I attest that I was present, lead the team conference, and concur with the assessment and plan of the team.   Ernest Pine 04/27/2022, 3:21 PM

## 2022-04-27 NOTE — Progress Notes (Signed)
Occupational Therapy Session Note  Patient Details  Name: Deborah Neal MRN: [id number] Date of Birth: [birth year]  Today's Date: 04/27/2022 OT Individual Time: 2482-5003 OT Individual Time Calculation (min): 70 min    Short Term Goals: Week 1:  OT Short Term Goal 1 (Week 1): STGs=LTGs due to patient's length of stay.  Skilled Therapeutic Interventions/Progress Updates:   Pt seen for am self care retraining session with focus on pt and caregiver training as dtr present for entire session. Pt declined shower this as as pt had shower yesterday but agreeable to full am self care routine sink side for bathing, dressing and grooming seated and progression of standing tolerance and safety. Pt required overall set up for UB and LB bathing and dressing including pull up, socks and tennis shoes. Min cues for hand placement for sit to stand. Pt stood with S for oral care for up to 2 minutes with HR increasing from 76 bpm to 86 bpm and recovery 1 min seated. OT integration of energy conservation and task simplification techniques initiated. No SOB noted. OT transported pt to and from demo apt and training and assessment in TTB and tub shower access and safety DME recs. Recommending 3 in 1 commode, family would like an estimate for TTB and family will purchase suction cup grab bar. Pt currently required close S and min cues for TTB access via SPT with RW. OT transported pt back to room, issued moderate resistant foam cube for hand strength and overall CDP mngt for 10 reps 3-4 x per day [place]. Left pt in w/c with dtr cleared for assisting pt, safety needs in place and needs and call button in reach.   Pt for Grad Day 1/8 and d/c 1/9 as per Care Conference with [facility] OT then transition to [place]. Family in agreement and MSW notified.    Therapy Documentation Precautions:  Precautions Precautions: Fall Restrictions Weight Bearing Restrictions: No    Therapy/Group: Individual Therapy  Barnabas Lister 04/27/2022,  7:48 AM

## 2022-04-27 NOTE — Progress Notes (Signed)
Physical Therapy Session Note  Patient Details  Name: Deborah Neal MRN: [id number] Date of Birth: 02-25-1951  Today's Date: 04/27/2022 PT Individual Time: 0800-0855, [id number]  PT Individual Time Calculation (min): 55 min, 26 min   Short Term Goals: Week 1:  PT Short Term Goal 1 (Week 1): =LTGs d/t ELOS  Skilled Therapeutic Interventions/Progress Updates:      Therapy Documentation Precautions:  Precautions Precautions: Fall Restrictions Weight Bearing Restrictions: No   Treatment Session 1:  Pt agreeable to PT session with gait, stair and balance training. Pt's daughter present for family training in session. Pt reports R UE pain around IV, MD notified and rounded with patient in session. Pt requires verbal cues for hand placement and CGA with sit to stand with RW and ambulated CGA ~180 ft to main gym. Pt required seated rest break and educated on energy conservation scale. Pt navigated 12 steps with 1 HR with close (S) for safety. Pt transported to dayroom by w/c for time management and energy conservation. Pt ambulated forward on agility ladder without an AD with CGA for safety and balance. Pt requires verbal cues to increase step length and visual target to keep head up and eyes forward. Pt navigated agility ladder 10 ft x 2 and ambulated to room ~180 ft no AD with verbal cues to keep head up and pt able to demonstrate safely. Pt left seated in w/c at bedside with all needs in reach and daughter present. Pt and family decline alarm.    Treatment Session 2:  Pt agreeable to PT session and without verbal reports of pain in session. Pt lethargic on arousal but alertness increased with increase physical activity. Pt ambulated ~200 ft CGA to dayroom no AD and required mod cues for attention. Pt performed Nustep 4 x 2 min with 1 minute rest breaks in between. Pt with increased gait speed and step length with gait ~200 ft to return to room SBA with no AD. Pt left semi-reclined in bed with all  needs in reach and granddaughter present.   Therapy/Group: Individual Therapy  Verl Dicker Verl Dicker PT, DPT  04/27/2022, 7:43 AM

## 2022-04-27 NOTE — Progress Notes (Signed)
Inpatient Rehabilitation  Patient information reviewed and entered into eRehab system by Helvi Royals M. Wahneta Derocher, M.A., CCC/SLP, PPS Coordinator.  Information including medical coding, functional ability and quality indicators will be reviewed and updated through discharge.    

## 2022-04-27 NOTE — Discharge Instructions (Addendum)
Inpatient Rehab Discharge Instructions  Deborah Neal Discharge date and time:  05/04/2022  Activities/Precautions/ Functional Status: Activity: no lifting, driving, or strenuous exercise until cleared by MD Diet: diabetic diet Wound Care: none needed Functional status:  ___ No restrictions     ___ Walk up steps independently ___ 24/7 supervision/assistance   ___ Walk up steps with assistance __x_ Intermittent supervision/assistance  ___ Bathe/dress independently ___ Walk with walker     ___ Bathe/dress with assistance ___ Walk Independently    ___ Shower independently ___ Walk with assistance    __x_ Shower with assistance _x__ No alcohol     ___ Return to work/school ________  Special Instructions: No driving, alcohol consumption or tobacco use.  Check your blood sugar 4 times a day and record. Take this information with you to your primary car provider.   COMMUNITY REFERRALS UPON DISCHARGE:    Home Health:   PT  OT  SP                Agency:[facility] Phone:[phone]    Medical Equipment/Items Ordered: BEDSIDE COMMODE AND TRANSPORT CHAIR Daughter found a tub bench and rolling walker for Mom to use                                                 Agency/Supplier: [facility]   [phone]                 My questions have been answered and I understand these instructions. I will adhere to these goals and the provided educational materials after my discharge from the hospital.  Patient/Caregiver Signature _______________________________ Date __________  Clinician Signature _______________________________________ Date __________  Please bring this form and your medication list with you to all your follow-up doctor's appointments.     Plate Method for Diabetes   Foods with carbohydrates make your blood glucose level go up. The plate method is a simple way to meal plan and control the amount of carbohydrate you eat.         Use the following guidance to  build a healthy plate to control carbohydrates. Divide a 9-inch plate into 3 sections, and consider your beverage the 4th section of your meal: Food Group Examples of Foods/Beverages for This Section of your Meal  Section 1: Non-starchy vegetables Fill  of your plate to include non-starchy vegetables Asparagus, broccoli, brussels sprouts, cabbage, carrots, cauliflower, celery, cucumber, green beans, mushrooms, peppers, salad greens, tomatoes, or zucchini.  Section 2: Protein foods Fill  of your plate to include a lean protein Lean meat, poultry, fish, seafood, cheese, eggs, lean deli meat, tofu, beans, lentils, nuts or nut butters.  Section 3: Carbohydrate foods Fill  of your plate to include carbohydrate foods Whole grains, whole wheat bread, brown rice, whole grain pasta, polenta, corn tortillas, fruit, or starchy vegetables (potatoes, green peas, corn, beans, acorn squash, and butternut squash). One cup of milk also counts as a food that contains carbohydrate.  Section 4: Beverage Choose water or a low-calorie drink for your beverage. Unsweetened tea, coffee, or flavored/sparkling water without added sugar.  Image reprinted with permission from The American Diabetes Association.  Copyright 2022 by the American Diabetes Association.   Copyright 2022  Academy of Nutrition and Dietetics. All rights reserved     Information on my medicine - ELIQUIS (apixaban)  This medication education was reviewed with me or my healthcare representative as part of my discharge preparation.    Why was Eliquis prescribed for you? Eliquis was prescribed to treat blood clots that may have been found in the veins of your legs (deep vein thrombosis) or in your lungs (pulmonary embolism) and to reduce the risk of them occurring again.  What do You need to know about Eliquis ? The dose is ONE 5 mg tablet taken TWICE daily.  Eliquis may be taken with or without food.   Try to take the dose about the same  time in the morning and in the evening. If you have difficulty swallowing the tablet whole please discuss with your pharmacist how to take the medication safely.  Take Eliquis exactly as prescribed and DO NOT stop taking Eliquis without talking to the doctor who prescribed the medication.  Stopping may increase your risk of developing a new blood clot.  Refill your prescription before you run out.  After discharge, you should have regular check-up appointments with your healthcare provider that is prescribing your Eliquis.    What do you do if you miss a dose? If a dose of ELIQUIS is not taken at the scheduled time, take it as soon as possible on the same day and twice-daily administration should be resumed. The dose should not be doubled to make up for a missed dose.  Important Safety Information A possible side effect of Eliquis is bleeding. You should call your healthcare provider right away if you experience any of the following: Bleeding from an injury or your nose that does not stop. Unusual colored urine (red or dark brown) or unusual colored stools (red or black). Unusual bruising for unknown reasons. A serious fall or if you hit your head (even if there is no bleeding).  Some medicines may interact with Eliquis and might increase your risk of bleeding or clotting while on Eliquis. To help avoid this, consult your healthcare provider or pharmacist prior to using any new prescription or non-prescription medications, including herbals, vitamins, non-steroidal anti-inflammatory drugs (NSAIDs) and supplements.  This website has more information on Eliquis (apixaban): http://www.eliquis.com/eliquis/home

## 2022-04-27 NOTE — Progress Notes (Signed)
Patient ID: Deborah Neal, female   DOB: 05-Dec-1950, 72 y.o.   MRN: [id number]  Met with pt and daughter who was present in her room to give team conference update regarding goals of supervision-mod/I level and target discharge date of 1/9. She is doing well and making progress daily she is very tired from her therapies and still getting used to it. Discussed equipment and they will get a rolling walker and daughter to look for tub bench and let worker know if needed. Will continue to work on discharge needs.

## 2022-04-27 NOTE — Progress Notes (Signed)
Met with patient and family at bedside. Oriented to rehab. Informed of team conference every Tuesday. Discussed education binder. Patient takes insulin at home. Discussed foods and proteins that help with blood sugar and low phosphorus. Will need daily weights at  home and record BS for follow up appts. Also will need to monitor b/p at home. Nurse in room to administer medications. All needs met, all questions answered.  

## 2022-04-27 NOTE — Progress Notes (Signed)
PROGRESS NOTE   Subjective/Complaints:  Pt admits today having some back/chest wall pain- but thinks tylenol should help- daughter thinks she's hurting more than that, but pt is "under-reporter" per daughter.  Slept "fair' but a lot of tossing and turning per daughter.  R hand/arm hurts from IV- will d/c since not using- painful RUE.   LBM this AM Incontinent of bladder  overnight.   ROS:  Pt denies SOB, abd pain, CP, N/V/C/D, and vision changes Except for HPI  Objective:   No results found. Recent Labs    04/25/22 0123 04/26/22 0628  WBC 7.5 7.5  HGB 7.6* 8.3*  HCT 24.9* 27.6*  PLT 313 360   Recent Labs    04/25/22 0123 04/26/22 0628  NA 139 140  K 3.4* 3.7  CL 98 100  CO2 31 29  GLUCOSE 207* 86  BUN 16 14  CREATININE 1.01* 1.02*  CALCIUM 8.4* 8.7*    Intake/Output Summary (Last 24 hours) at 04/27/2022 0912 Last data filed at 04/26/2022 1742 Gross per 24 hour  Intake 118 ml  Output --  Net 118 ml     Pressure Injury 04/09/22 Sacrum Medial Stage 2 -  Partial thickness loss of dermis presenting as a shallow open injury with a red, pink wound bed without slough. open Blister to sacrum (Active)  04/09/22 1700  Location: Sacrum  Location Orientation: Medial  Staging: Stage 2 -  Partial thickness loss of dermis presenting as a shallow open injury with a red, pink wound bed without slough.  Wound Description (Comments): open Blister to sacrum  Present on Admission: Yes    Physical Exam: Vital Signs Blood pressure (!) 142/77, pulse 77, temperature 98.1 F (36.7 C), temperature source Oral, resp. rate 16, height '5\' 3"'$  (1.6 m), weight 73.1 kg, SpO2 98 %.     General: awake, alert, appropriate, sitting up in w/c in day room with PT and daughter; NAD HENT: conjugate gaze; oropharynx moist CV: regular rate; no JVD Pulmonary: CTA B/L; no W/R/R- good air movement GI: soft, NT, ND, (+)BS Psychiatric:  appropriate- quiet, but interacts Neurological: Ox3- obvious under-reporter- "doesn't want to complain".  Extremities;  No LE edema at all B/L Musculoskeletal:     Cervical back: Neck supple. No tenderness.     Comments: 5/5 in B/L UE/arms- biceps, triceps, WE, grip and FA 5-/5 in B/L LE's- HF, KE, KF, DF and PF 5/5 B/L  Skin:    General: Skin is warm and dry.     Comments: R forearm IV- looks OK Neck irritation as above No significant LE edema- maybe trace in feet/ankles B/L  Neurological:     Mental Status: She is alert and oriented to person, place, and time.     Comments: Intact to light touch in all 4 extremities Ox3- able to answer 2-3 steps questions and follow 2 step commands  Assessment/Plan: 1. Functional deficits which require 3+ hours per day of interdisciplinary therapy in a comprehensive inpatient rehab setting. Physiatrist is providing close team supervision and 24 hour management of active medical problems listed below. Physiatrist and rehab team continue to assess barriers to discharge/monitor patient progress toward functional and medical goals  Care Tool:  Bathing    Body parts bathed by patient: Right arm, Left arm, Chest, Abdomen, Front perineal area, Buttocks, Right upper leg, Left upper leg, Right lower leg, Left lower leg, Face         Bathing assist Assist Level: Supervision/Verbal cueing     Upper Body Dressing/Undressing Upper body dressing   What is the patient wearing?: Bra, Pull over shirt    Upper body assist Assist Level: Supervision/Verbal cueing    Lower Body Dressing/Undressing Lower body dressing      What is the patient wearing?: Underwear/pull up, Pants     Lower body assist Assist for lower body dressing: Supervision/Verbal cueing     Toileting Toileting    Toileting assist Assist for toileting: Contact Guard/Touching assist     Transfers Chair/bed transfer  Transfers assist     Chair/bed transfer assist level:  Minimal Assistance - Patient > 75%     Locomotion Ambulation   Ambulation assist      Assist level: Minimal Assistance - Patient > 75% Assistive device: Walker-rolling Max distance: 200 ft   Walk 10 feet activity   Assist     Assist level: Minimal Assistance - Patient > 75% Assistive device: Walker-rolling   Walk 50 feet activity   Assist    Assist level: Minimal Assistance - Patient > 75% Assistive device: Walker-rolling    Walk 150 feet activity   Assist    Assist level: Minimal Assistance - Patient > 75% Assistive device: Walker-rolling    Walk 10 feet on uneven surface  activity   Assist     Assist level: Moderate Assistance - Patient - 50 - 74% Assistive device: Walker-rolling   Wheelchair     Assist Is the patient using a wheelchair?: No (transport for time management only)             Wheelchair 50 feet with 2 turns activity    Assist            Wheelchair 150 feet activity     Assist          Blood pressure (!) 142/77, pulse 77, temperature 98.1 F (36.7 C), temperature source Oral, resp. rate 16, height '5\' 3"'$  (1.6 m), weight 73.1 kg, SpO2 98 %.  Medical Problem List and Plan: 1. Functional deficits secondary to debility from cardiac arrest with CPR >2 minutes-              -patient may  shower             -ELOS/Goals: 12-16 days- mod I to supervision  Con't CIR- PT and OT-will check on Slp eval?  Team conference today to determine length of stay 2.  Antithrombotics: -DVT/anticoagulation:  Pharmaceutical: Eliquis              -antiplatelet therapy: none   3. Pain Management: Tylenol as needed             Sore ribs from CPR- might benefit from Lidoderm patches?  1/2- per daughter, is under-reportor- will add Lidoderm patches 3 patches 8pm to 8am- monitor closely- might need stronger meds.  4. Mood/Behavior/Sleep: LCSW to evaluate and provide emotional support             -antipsychotic agents: n/a   5.  Neuropsych/cognition: This patient is capable of making decisions on her own behalf. Doesn't have signs of anoxia- but will order SLP for cognition just to make sure don't miss some mild issues  1/1- SLP eval 6. Skin/Wound Care: Routine skin care checks   7. Fluids/Electrolytes/Nutrition: routine Is and Os -carb modified diet -K phos 500 mg TID -Lasix continues for diffuse edema - edema almost resolved in legs- will follow IM recs on when to stop Lasix- maybe earlier if improves. 1/1- No LE edema, however has moderate vascular congestion on CXR- will wait to reduce Lasix.  8: Cardiac arrest, suspected PE s/p tenecteplase             -follow-up with Dr. Florene Glen   9: Seizure-like activity: Keppra discontinued   10: AKI: creatinine at baseline; follow-up BMP   1/1- Cr very slightly elevated at 1.02 and BUN 14- con't to monitor 11: Respiratory failure/pneumonia: completed antibiotics             -pulmonary toilet; albuterol nebs as needed             -Duoneb BID   12: Bilateral pleural effusions: repeat chest x-ray on 12/30>>improvement (see report above)             -diuresis continues with Lasix 40 mg daily   1/1- wait to reduce Lasix due to pulm vascular congestion 13: Anemia: Hemoglobin with downward trend: 7.6; recheck CBC    1/1- Hb up to 8.3-  14: Rib fractures: pain control   15: Hypertension: monitor TID and prn             continue  Lasix 40 mg daily             -continue Norvasc 7.5 mg daily   16: DM/DKA: Ca1c >15.5 BGs q AC and q HS; (home meds: Jardiance 25 mg daily, Toujeo 100 units daily, Humulin R U500 60 units TID with meals)              -SSI             -Novolog 8 units with meals             -Semglee 18 units BID   1/1- CBGs 91 to 181- MUCH better- con't regimen for now  1/2- CBGs OK except 1 value of 331 last night- will check with pt if was snacking? 17: Left upper lobe atelectasis: Concern for chronic obstruction of left upper lobe bronchus              -Recommend follow-up CT scan in 3 months versus other imaging   18: Hypokalemia: replete; follow-up BMP   1/1- K+ 3.7 19: Hyperlipidemia: Crestor   20: Hypomagnesemia: continue Mag-ox 400 mg daily; recheck mag 1/1 1/2- will increase Mg Ox to 400 mg BID 21: Elevated TSH: T3, T4 pending   1/1- T4 is normal at 0.81- on lower side, but still in normal limits; however TSH is 9.8- will start 75 mcg Synthroid since has hx of TSH being elevated as well as borderline high in past as well- pt also c/o being cold all the time and dry skin/hair falling out.  22. Urine incontinence  1/1- will get timed voiding- due to incontinence- likely due to using Purewick so long, but will monitor  1/2- only overnight- likely due to [place]- will try timed voiding.  23. Insomnia  1/2- Melatonin didn't work- will try Trazodone 25 mg QHS prn- and daughter will encourage her to take.   I spent a total of 41   minutes on total care today- >50% coordination of care- due to d/w pt/daughter about sleep, pain and team conference to determine length of  stay  LOS: 2 days A FACE TO FACE EVALUATION WAS PERFORMED  Raquel Sayres 04/27/2022, 9:12 AM

## 2022-04-28 DIAGNOSIS — R5381 Other malaise: Secondary | ICD-10-CM | POA: Diagnosis not present

## 2022-04-28 LAB — GLUCOSE, CAPILLARY
Glucose-Capillary: 71 mg/dL (ref 70–99)
Glucose-Capillary: 118 mg/dL — ABNORMAL HIGH (ref 70–99)
Glucose-Capillary: 198 mg/dL — ABNORMAL HIGH (ref 70–99)
Glucose-Capillary: 201 mg/dL — ABNORMAL HIGH (ref 70–99)

## 2022-04-28 MED ORDER — INSULIN GLARGINE-YFGN 100 UNIT/ML ~~LOC~~ SOLN
16.0000 [IU] | Freq: Two times a day (BID) | SUBCUTANEOUS | Status: DC
  Administered 2022-04-28 – 2022-05-03 (×11): 16 [IU] via SUBCUTANEOUS
  Filled 2022-04-28 (×13): qty 0.16

## 2022-04-28 NOTE — Progress Notes (Signed)
Physical Therapy Session Note  Patient Details  Name: DEAVION OLIVEROS MRN: [id number] Date of Birth: 07/01/1950  Today's Date: 04/28/2022 PT Individual Time: 1032-1100 PT Individual Time Calculation (min): 28 min   Short Term Goals: Week 1:  PT Short Term Goal 1 (Week 1): =LTGs d/t ELOS  Skilled Therapeutic Interventions/Progress Updates:  Patient sidelying to L side and asleep on entrance to room. Once woken gently, pt alert and agreeable to PT session.   Patient with no pain complaint at start of session.  Therapeutic Activity: Bed Mobility: Pt performed supine --> sit with Mod I and use of bed rails. No cueing required for technique or balance. While seated EOB, pt requires MaxA to don tennis shoes.  Transfers: Pt performed sit<>stand and stand pivot transfers throughout session with supervision. Provided minimal cueing for upright posture on stance and fully turning with reach back to armrest prior to sit in seat.  Gait Training/ NMR:  Pt ambulated 170 ft using RW with close supervision. Demonstrated flexed posture with use of RW. Provided vc/ tc for improving proximity to walker. Ambulation back to room form day room at same distance without RW and significantly improved posture, armswing, and overall quality of gait with slightly decreased stride. Intermittent vc for heel strike.   Pt guided in aspects of FGA - please see results performed below.   Neuromuscular Re-ed: NMR facilitated during session with focus on dynamic standing balance. Pt guided in reach to targets on wall from overhead to below waist height requiring one step to reach. Pt is able to reach all single and double called targets with called hand use and personal choice of LE for step forward. No LOB throughout. When increased to 3 targets, balance and chosen LE is good, but pt with difficulty remembering sequence. NMR performed for improvements in motor control and coordination, balance, sequencing, judgement, and self  confidence/ efficacy in performing all aspects of mobility at highest level of independence.   Patient seated EOB at end of session with brakes locked, bed alarm set, and all needs within reach. Dtr in room for supervision.    Therapy Documentation Precautions:  Precautions Precautions: Fall Restrictions Weight Bearing Restrictions: No General:   Vital Signs: Therapy Vitals Temp: 97.7 F (36.5 C) Temp Source: Oral Pulse Rate: 79 Resp: 16 BP: 133/71 Patient Position (if appropriate): Sitting Oxygen Therapy SpO2: 100 % O2 Device: Room Air Pain:  No pain complaint this session   Balance: Standardized Balance Assessment Standardized Balance Assessment: Functional Gait Assessment Functional Gait  Assessment Gait Level Surface: Walks 20 ft in less than 7 sec but greater than 5.5 sec, uses assistive device, slower speed, mild gait deviations, or deviates 6-10 in outside of the 12 in walkway width. Change in Gait Speed: Makes only minor adjustments to walking speed, or accomplishes a change in speed with significant gait deviations, deviates 10-15 in outside the 12 in walkway width, or changes speed but loses balance but is able to recover and continue walking. (good change to slower pace, minimal change to faster pace) Gait with Horizontal Head Turns: Performs head turns smoothly with slight change in gait velocity (eg, minor disruption to smooth gait path), deviates 6-10 in outside 12 in walkway width, or uses an assistive device. (pt with minimal cervical rotation and more lateral/ medial eye movements) Gait with Vertical Head Turns: Performs task with slight change in gait velocity (eg, minor disruption to smooth gait path), deviates 6 - 10 in outside 12 in walkway width  or uses assistive device (moderate cervical ext/ flexion) Gait with Eyes Closed: Walks 20 ft, slow speed, abnormal gait pattern, evidence for imbalance, deviates 10-15 in outside 12 in walkway width. Requires more  than 9 sec to ambulate 20 ft. Ambulating Backwards: Walks 20 ft, slow speed, abnormal gait pattern, evidence for imbalance, deviates 10-15 in outside 12 in walkway width.     Therapy/Group: Individual Therapy  Loel Dubonnet 04/28/2022, 2:07 PM

## 2022-04-28 NOTE — Progress Notes (Signed)
PROGRESS NOTE   Subjective/Complaints:  Pt admits today having some back/chest wall pain- but thinks tylenol should help- daughter thinks she's hurting more than that, but pt is "under-reporter" per daughter.  Slept "fair' but a lot of tossing and turning per daughter.  R hand/arm hurts from IV- will d/c since not using- painful RUE.   LBM this AM Incontinent of bladder  overnight.   ROS:  Pt denies SOB, abd pain, CP, N/V/C/D, and vision changes Except for HPI  Objective:   No results found. Recent Labs    04/26/22 0628  WBC 7.5  HGB 8.3*  HCT 27.6*  PLT 360   Recent Labs    04/26/22 0628  NA 140  K 3.7  CL 100  CO2 29  GLUCOSE 86  BUN 14  CREATININE 1.02*  CALCIUM 8.7*    Intake/Output Summary (Last 24 hours) at 04/28/2022 0846 Last data filed at 04/28/2022 0740 Gross per 24 hour  Intake 960 ml  Output --  Net 960 ml     Pressure Injury 04/09/22 Sacrum Medial Stage 2 -  Partial thickness loss of dermis presenting as a shallow open injury with a red, pink wound bed without slough. open Blister to sacrum (Active)  04/09/22 1700  Location: Sacrum  Location Orientation: Medial  Staging: Stage 2 -  Partial thickness loss of dermis presenting as a shallow open injury with a red, pink wound bed without slough.  Wound Description (Comments): open Blister to sacrum  Present on Admission: Yes    Physical Exam: Vital Signs Blood pressure 126/71, pulse 79, temperature 98.8 F (37.1 C), resp. rate 16, height '5\' 3"'$  (1.6 m), weight 72.2 kg, SpO2 99 %.     General: awake, alert, appropriate, sitting up in w/c in day room with PT and daughter; NAD HENT: conjugate gaze; oropharynx moist CV: regular rate; no JVD Pulmonary: CTA B/L; no W/R/R- good air movement GI: soft, NT, ND, (+)BS Psychiatric: appropriate- quiet, but interacts Neurological: Ox3- obvious under-reporter- "doesn't want to complain".   Extremities;  No LE edema at all B/L Musculoskeletal:     Cervical back: Neck supple. No tenderness.     Comments: 5/5 in B/L UE/arms- biceps, triceps, WE, grip and FA 5-/5 in B/L LE's- HF, KE, KF, DF and PF 5/5 B/L  Skin:    General: Skin is warm and dry.     Comments: R forearm IV- looks OK Neck irritation as above No significant LE edema- maybe trace in feet/ankles B/L  Neurological:     Mental Status: She is alert and oriented to person, place, and time.     Comments: Intact to light touch in all 4 extremities Ox3- able to answer 2-3 steps questions and follow 2 step commands  Assessment/Plan: 1. Functional deficits which require 3+ hours per day of interdisciplinary therapy in a comprehensive inpatient rehab setting. Physiatrist is providing close team supervision and 24 hour management of active medical problems listed below. Physiatrist and rehab team continue to assess barriers to discharge/monitor patient progress toward functional and medical goals  Care Tool:  Bathing    Body parts bathed by patient: Right arm, Left arm, Chest, Abdomen, Front  perineal area, Buttocks, Right upper leg, Left upper leg, Right lower leg, Left lower leg, Face         Bathing assist Assist Level: Set up assist     Upper Body Dressing/Undressing Upper body dressing   What is the patient wearing?: Bra, Pull over shirt    Upper body assist Assist Level: Set up assist    Lower Body Dressing/Undressing Lower body dressing      What is the patient wearing?: Underwear/pull up, Pants     Lower body assist Assist for lower body dressing: Set up assist     Toileting Toileting    Toileting assist Assist for toileting: Supervision/Verbal cueing     Transfers Chair/bed transfer  Transfers assist     Chair/bed transfer assist level: Minimal Assistance - Patient > 75%     Locomotion Ambulation   Ambulation assist      Assist level: Minimal Assistance - Patient >  75% Assistive device: Walker-rolling Max distance: 200 ft   Walk 10 feet activity   Assist     Assist level: Minimal Assistance - Patient > 75% Assistive device: Walker-rolling   Walk 50 feet activity   Assist    Assist level: Minimal Assistance - Patient > 75% Assistive device: Walker-rolling    Walk 150 feet activity   Assist    Assist level: Minimal Assistance - Patient > 75% Assistive device: Walker-rolling    Walk 10 feet on uneven surface  activity   Assist     Assist level: Moderate Assistance - Patient - 50 - 74% Assistive device: Aeronautical engineer Is the patient using a wheelchair?: Yes (transport for time management only) Type of Wheelchair: Manual           Wheelchair 50 feet with 2 turns activity    Assist        Assist Level: Dependent - Patient 0%   Wheelchair 150 feet activity     Assist      Assist Level: Dependent - Patient 0%   Blood pressure 126/71, pulse 79, temperature 98.8 F (37.1 C), resp. rate 16, height '5\' 3"'$  (1.6 m), weight 72.2 kg, SpO2 99 %.  Medical Problem List and Plan: 1. Functional deficits secondary to debility from cardiac arrest with CPR >2 minutes-              -patient may  shower             -ELOS/Goals: 12-16 days- mod I to supervision  Con't CIR- PT, OT and SLP for mild anoxia-  D/c date- 05/04/22 2.  Antithrombotics: -DVT/anticoagulation:  Pharmaceutical: Eliquis              -antiplatelet therapy: none   3. Pain Management: Tylenol as needed             Sore ribs from CPR- might benefit from Lidoderm patches?  1/2- per daughter, is under-reportor- will add Lidoderm patches 3 patches 8pm to 8am- monitor closely- might need stronger meds.   1/3- still having some chest pain- chest wall- however lidoderm was helpful overnight 4. Mood/Behavior/Sleep: LCSW to evaluate and provide emotional support             -antipsychotic agents: n/a   5. Neuropsych/cognition:  This patient is capable of making decisions on her own behalf. Pt has mild anoxia per SLP- SLP seeing.   6. Skin/Wound Care: Routine skin care checks   7. Fluids/Electrolytes/Nutrition: routine Is and  Os -carb modified diet -K phos 500 mg TID -Lasix continues for vascular congestion - edema almost resolved in legs- will follow IM recs on when to stop Lasix- maybe earlier if improves. 1/1- No LE edema, however has moderate vascular congestion on CXR- will wait to reduce Lasix.  1/3- will check CXR in AM 8: Cardiac arrest, suspected PE s/p tenecteplase             -follow-up with Dr. Florene Glen   9: Seizure-like activity: Keppra discontinued   10: AKI: creatinine at baseline; follow-up BMP   1/1- Cr very slightly elevated at 1.02 and BUN 14- con't to monitor  1/3- will recheck labs in AM 11: Respiratory failure/pneumonia: completed antibiotics             -pulmonary toilet; albuterol nebs as needed             -Duoneb BID   12: Bilateral pleural effusions: repeat chest x-ray on 12/30>>improvement (see report above)             -diuresis continues with Lasix 40 mg daily   1/1- wait to reduce Lasix due to pulm vascular congestion  1/3- will recheck CXR tomorrow 13: Anemia: Hemoglobin with downward trend: 7.6; recheck CBC    1/1- Hb up to 8.3-  14: Rib fractures: pain control   15: Hypertension: monitor TID and prn             continue  Lasix 40 mg daily             -continue Norvasc 7.5 mg daily   1/3- well controlled- con't regimen 16: DM/DKA: Ca1c >15.5 BGs q AC and q HS; (home meds: Jardiance 25 mg daily, Toujeo 100 units daily, Humulin R U500 60 units TID with meals)              -SSI             -Novolog 8 units with meals             -Semglee 18 units BID   1/1- CBGs 91 to 181- MUCH better- con't regimen for now  1/2- CBGs OK except 1 value of 331 last night- will check with pt if was snacking?  1/3- CBGs 64-149- will back off Semglee to 16 units BID- educated on getting  Dexcom, etc outpt 17: Left upper lobe atelectasis: Concern for chronic obstruction of left upper lobe bronchus             -Recommend follow-up CT scan in 3 months versus other imaging   18: Hypokalemia: replete; follow-up BMP   1/1- K+ 3.7 19: Hyperlipidemia: Crestor   20: Hypomagnesemia: continue Mag-ox 400 mg daily; recheck mag 1/1 1/2- will increase Mg Ox to 400 mg BID 1/3- will recheck Mg Thursday 21: Elevated TSH: T3, T4 pending   1/1- T4 is normal at 0.81- on lower side, but still in normal limits; however TSH is 9.8- will start 75 mcg Synthroid since has hx of TSH being elevated as well as borderline high in past as well- pt also c/o being cold all the time and dry skin/hair falling out.  22. Urine incontinence  1/1- will get timed voiding- due to incontinence- likely due to using Purewick so long, but will monitor  1/2- only overnight- likely due to [place]- will try timed voiding.   1/3- started timed voiding 23. Insomnia  1/2- Melatonin didn't work- will try Trazodone 25 mg QHS prn- and daughter will encourage her  to take.   1/3- pt took 25 mg Trazodone- slept much better 24. Mild anoxia  1/3- due to cardiac arrest/need for CPR- ordered SLP eval and they have picked her up- will need reminders due to [facility] deficits.   I spent a total of 40   minutes on total care today- >50% coordination of care- due to IPOC and d/w nursing about pt's pain as well as pt. Also complicated medical issues today  LOS: 3 days A FACE TO FACE EVALUATION WAS PERFORMED  Trayon Krantz 04/28/2022, 8:46 AM

## 2022-04-28 NOTE — IPOC Note (Signed)
Overall Plan of Care [facility]) Patient Details Name: TALEA MANGES MRN: [id number] DOB: 1950-06-04  Admitting Diagnosis: [facility] Problems: Principal Problem:   Debility Active Problems:   Cardiac arrest [facility])     Functional Problem List: Nursing Bladder, Endurance, Pain, Skin Integrity, Medication Management, Safety  PT Balance, Edema, Safety, Endurance, Sensory, Motor, Skin Integrity  OT Cognition, Endurance, Safety, Balance  SLP Cognition  TR         Basic ADL's: OT Bathing, Dressing, Toileting     Advanced  ADL's: OT Simple Meal Preparation, Light Housekeeping, Laundry     Transfers: PT Bed Mobility, Bed to Chair, Teacher, early years/pre, Tub/Shower     Locomotion: PT Ambulation, Stairs     Additional Impairments: OT    SLP Social Cognition   Problem Solving, Memory, Awareness, Attention  TR      Anticipated Outcomes Item Anticipated Outcome  Self Feeding    Swallowing      Basic self-care  Mod I  Toileting  Mod I   Bathroom Transfers Mod I  Bowel/Bladder  manage bladder w toileting  Transfers  supervision  Locomotion  supervision gait  Communication     Cognition  Supervision  Pain  < 4 with prns  Safety/Judgment  manage w cues   Therapy Plan: PT Intensity: Minimum of 1-2 x/day ,45 to 90 minutes PT Frequency: 5 out of 7 days PT Duration Estimated Length of Stay: 5-7 days OT Intensity: Minimum of 1-2 x/day, 45 to 90 minutes OT Frequency: 5 out of 7 days OT Duration/Estimated Length of Stay: 5-7 days SLP Intensity: Minumum of 1-2 x/day, 30 to 90 minutes SLP Frequency: 3 to 5 out of 7 days SLP Duration/Estimated Length of Stay: 7-10 days   Team Interventions: Nursing Interventions Patient/Family Education, Pain Management, Skin Care/Wound Management, Discharge Planning, Medication Management, Disease Management/Prevention, Bladder Management  PT interventions Ambulation/gait training, Cognitive remediation/compensation, Discharge  planning, DME/adaptive equipment instruction, Functional mobility training, Pain management, Psychosocial support, Therapeutic Activities, Splinting/orthotics, UE/LE Strength taining/ROM, Visual/perceptual remediation/compensation, Wheelchair propulsion/positioning, UE/LE Coordination activities, Therapeutic Exercise, Stair training, Patient/family education, Skin care/wound management, Neuromuscular re-education, Functional electrical stimulation, Disease management/prevention, Academic librarian, Training and development officer  OT Interventions Training and development officer, Cognitive remediation/compensation, Academic librarian, Discharge planning, Engineer, drilling, Functional mobility training, Patient/family education, Self Care/advanced ADL retraining, Therapeutic Activities, Therapeutic Exercise, UE/LE Strength taining/ROM, UE/LE Coordination activities  SLP Interventions Cognitive remediation/compensation, Cueing hierarchy, Internal/external aids, Environmental controls, Therapeutic Activities, Functional tasks, Patient/family education  TR Interventions    SW/CM Interventions Discharge Planning, Psychosocial Support, Patient/Family Education   Barriers to Discharge MD  Medical stability, New diabetic, Incontinence, Wound care, Weight, and Weight bearing restrictions  Nursing Decreased caregiver support, Home environment access/layout 1 level 4 ste right rail w grand daughter  PT Inaccessible home environment, Home environment access/layout    OT Incontinence Pt with episodes of incontinence throughout hospital stay.  SLP      SW Insurance for SNF coverage     Team Discharge Planning: Destination: PT-Home ,OT- Home , SLP-Home Projected Follow-up: PT-Home health PT, OT-  Outpatient OT, SLP-24 hour supervision/assistance, Outpatient SLP Projected Equipment Needs: PT-Rolling walker with 5" wheels, OT- To be determined, SLP-None recommended by SLP Equipment Details:  PT- , OT-  Patient/family involved in discharge planning: PT- Family member/caregiver, Patient,  OT-Patient, Family member/caregiver, SLP-Patient, Family member/caregiver  MD ELOS: 5-7 days Medical Rehab Prognosis:  Good Assessment: The patient has been admitted for CIR therapies with the diagnosis of cardiac arrest and debility  with mild cognitive impairments, likely due ot anoxia. The team will be addressing functional mobility, strength, stamina, balance, safety, adaptive techniques and equipment, self-care, bowel and bladder mgt, patient and caregiver education, . Goals have been set at supervision for SLP; otherwise mod I. Anticipated discharge destination is home with family.        See Team Conference Notes for weekly updates to the plan of care

## 2022-04-28 NOTE — Plan of Care (Signed)
Wound Plan   Braden Score: 16   Sensory: 3  Moisture: 3  Activity: 3  Mobility: 3  Nutrition: 3  Friction: 1   Wounds present:   Pressure Injury Stage II Medial Sacrum   04/09/22 (POA)  Interventions:   Ensure Plus 3x/day Multivitamin with minerals tablet Registered Dietician   Attendees/Contributors:   Tacy Learn, RN Danae Chen Doe, OT Verl Dicker, PT

## 2022-04-28 NOTE — Progress Notes (Signed)
Physical Therapy Session Note  Patient Details  Name: Deborah Neal MRN: [id number] Date of Birth: 1950-05-12  Today's Date: 04/28/2022 PT Individual Time: 0802-0915, 6734-1937 PT Individual Time Calculation (min): 73 min , 44 min   Short Term Goals: Week 1:  PT Short Term Goal 1 (Week 1): =LTGs d/t ELOS  Skilled Therapeutic Interventions/Progress Updates:      Therapy Documentation Precautions:  Precautions Precautions: Fall Restrictions Weight Bearing Restrictions: No  Treatment Session 1:  Pt agreeable to PT session with emphasis on balance and gait training. Pt reports 6/10 rib pain, declines pain medicine. Pt transported total A for energy conservation to main gym. Pt performed Berg balance assessment, indicating pt is a significant risk of falls, see below for more details. Pt transitioned to high level balance training and performed 3 x 4 3 inch alternating forward step taps with CGA for balance. Pt performed 3 x 8 lateral 3 inch step taps with CGA/min A for balance with no AD. Pt ambulated ~335 ft in one bout with SBA and bilateral 0.75# ankle weights to facilitate increased foot clearance and LE strengthening. Pt ambulated SBA with decreased gait speed due to fatigue to room no AD and left seated in w/c at bedside with daughter present.   Balance: Balance Balance Assessed: Yes Standardized Balance Assessment Standardized Balance Assessment: Berg Balance Test Berg Balance Test Sit to Stand: Able to stand without using hands and stabilize independently Standing Unsupported: Able to stand safely 2 minutes Sitting with Back Unsupported but Feet Supported on Floor or Stool: Able to sit safely and securely 2 minutes Stand to Sit: Sits safely with minimal use of hands Transfers: Able to transfer safely, minor use of hands Standing Unsupported with Eyes Closed: Able to stand 10 seconds with supervision Standing Ubsupported with Feet Together: Able to place feet together  independently and stand 1 minute safely From Standing, Reach Forward with Outstretched Arm: Can reach confidently >25 cm (10") From Standing Position, Pick up Object from Floor: Able to pick up shoe safely and easily From Standing Position, Turn to Look Behind Over each Shoulder: Looks behind from both sides and weight shifts well Turn 360 Degrees: Able to turn 360 degrees safely one side only in 4 seconds or less Standing Unsupported, Alternately Place Feet on Step/Stool: Needs assistance to keep from falling or unable to try Standing Unsupported, One Foot in Front: Loses balance while stepping or standing Standing on One Leg: Unable to try or needs assist to prevent fall Total Score: 42  Patient demonstrates increased fall risk as noted by score of  42 /56 on Berg Balance Scale.  (<36= high risk for falls, close to 100%; 37-45 significant >80%; 46-51 moderate >50%; 52-55 lower >25%)  Treatment Session 2:   Pt agreeable to PT session and with unrated low back pain and residual rib pain,declines medical/therapeutic intervention. Pt ambulated SBA with no AD to dayroom and participated in FGA, see below for additional details. Pt returned to room and ambulated ~>300 ft throughout session with SBA with no AD with verbal cues for heel strike with gait to prevent anterior translation and fall. Pt returned to room and left semi-reclined in bed with all needs in reach and alarm.   Standardized Balance Assessment: Functional Gait Assessment Functional Gait  Assessment Gait assessed : Yes Gait Level Surface: Walks 20 ft in less than 7 sec but greater than 5.5 sec, uses assistive device, slower speed, mild gait deviations, or deviates 6-10 in outside of the  12 in walkway width. Change in Gait Speed: Makes only minor adjustments to walking speed, or accomplishes a change in speed with significant gait deviations, deviates 10-15 in outside the 12 in walkway width, or changes speed but loses balance but is  able to recover and continue walking. Gait with Horizontal Head Turns: Performs head turns smoothly with slight change in gait velocity (eg, minor disruption to smooth gait path), deviates 6-10 in outside 12 in walkway width, or uses an assistive device. Gait with Vertical Head Turns: Performs task with slight change in gait velocity (eg, minor disruption to smooth gait path), deviates 6 - 10 in outside 12 in walkway width or uses assistive device Gait and Pivot Turn: Pivot turns safely in greater than 3 sec and stops with no loss of balance, or pivot turns safely within 3 sec and stops with mild imbalance, requires small steps to catch balance. Step Over Obstacle: Is able to step over one shoe box (4.5 in total height) but must slow down and adjust steps to clear box safely. May require verbal cueing. Gait with Narrow Base of Support: Ambulates 7-9 steps. Gait with Eyes Closed: Walks 20 ft, slow speed, abnormal gait pattern, evidence for imbalance, deviates 10-15 in outside 12 in walkway width. Requires more than 9 sec to ambulate 20 ft. Ambulating Backwards: Walks 20 ft, slow speed, abnormal gait pattern, evidence for imbalance, deviates 10-15 in outside 12 in walkway width. Steps: Two feet to a stair, must use rail. Total Score: 15  Patient demonstrates increased fall risk as noted by score of 15/30 on  Functional Gait Assessment.   <22/30 = predictive of falls, <20/30 = fall in 6 months, <18/30 = predictive of falls in PD MCID: 5 points stroke population, 4 points geriatric population (ANPTA Core Set of Outcome Measures for Adults with Neurologic Conditions, 2018)   Therapy/Group: Individual Therapy  Verl Dicker 04/28/2022, 8:27 AM

## 2022-04-28 NOTE — Progress Notes (Signed)
Occupational Therapy Session Note  Patient Details  Name: Deborah Neal MRN: [id number] Date of Birth: 24-Sep-1950  Today's Date: 04/28/2022 OT Individual Time: 1300-1358 OT Individual Time Calculation (min): 58 min    Short Term Goals: Week 1:  OT Short Term Goal 1 (Week 1): STGs=LTGs due to patient's length of stay.  Skilled Therapeutic Interventions/Progress Updates:   Pt seen this pm for skilled OT with focus on full shower and self care retraining with balance, activity tolerance and safety integration. Pt much brighter this session with very little to no intervals of distraction, slowed processing or flat affect. Pt was able to take an active role in session and provide intervals of humor and accounts of home life throughout indicating improved processing from previous sessions.  Pt able to amb without AD to and from bed to bathroom with CGA. Full shower seated level with intermittent standing with close S UB and CGA LB with min cues for task simplification and safe sequencing. Performed UB dressing set up, LB dressing CGA seated on commode topper with intermittent standing. Increased time only for bra fastener. Back on EOB, OT initiated written UE HEP with breathing integration for sh elevation 10 reps x 2 sets and beige theraputty (soft) for B hand strength and cdp management including intrinsic strength and fmc for bead retrieval. Pt able to complete with no pain reported and HR 76-82 bpm throughout session. Left pt with bed exit engaged, needs and nurse call button in reach.     Therapy Documentation Precautions:  Precautions Precautions: Fall Restrictions Weight Bearing Restrictions: No    Therapy/Group: Individual Therapy  Barnabas Lister 04/28/2022, 7:53 AM

## 2022-04-29 ENCOUNTER — Ambulatory Visit: Payer: Medicare PPO

## 2022-04-29 ENCOUNTER — Inpatient Hospital Stay (HOSPITAL_COMMUNITY): Payer: Medicare PPO

## 2022-04-29 DIAGNOSIS — R5381 Other malaise: Secondary | ICD-10-CM | POA: Diagnosis not present

## 2022-04-29 LAB — GLUCOSE, CAPILLARY
Glucose-Capillary: 228 mg/dL — ABNORMAL HIGH (ref 70–99)
Glucose-Capillary: 161 mg/dL — ABNORMAL HIGH (ref 70–99)
Glucose-Capillary: 97 mg/dL (ref 70–99)
Glucose-Capillary: 192 mg/dL — ABNORMAL HIGH (ref 70–99)

## 2022-04-29 LAB — BASIC METABOLIC PANEL
Anion gap: 9 (ref 5–15)
BUN: 22 mg/dL (ref 8–23)
CO2: 27 mmol/L (ref 22–32)
Calcium: 8.7 mg/dL — ABNORMAL LOW (ref 8.9–10.3)
Chloride: 102 mmol/L (ref 98–111)
Creatinine, Ser: 1.02 mg/dL — ABNORMAL HIGH (ref 0.44–1.00)
GFR, Estimated: 59 mL/min — ABNORMAL LOW (ref >60)
Glucose, Bld: 242 mg/dL — ABNORMAL HIGH (ref 70–99)
Potassium: 3.7 mmol/L (ref 3.5–5.1)
Sodium: 138 mmol/L (ref 135–145)

## 2022-04-29 LAB — MAGNESIUM: Magnesium: 1.8 mg/dL (ref 1.7–2.4)

## 2022-04-29 MED ORDER — PROSOURCE PLUS PO LIQD
30.0000 mL | Freq: Two times a day (BID) | ORAL | Status: DC
  Administered 2022-04-30 – 2022-05-03 (×5): 30 mL via ORAL
  Filled 2022-04-29 (×2): qty 30

## 2022-04-29 MED ORDER — FUROSEMIDE 20 MG PO TABS
20.0000 mg | ORAL_TABLET | Freq: Every day | ORAL | Status: DC
  Administered 2022-04-30 – 2022-05-04 (×5): 20 mg via ORAL
  Filled 2022-04-29 (×5): qty 1

## 2022-04-29 NOTE — Progress Notes (Signed)
Speech Language Pathology Daily Session Note  Patient Details  Name: Deborah Neal MRN: [id number] Date of Birth: July 02, 1950  Today's Date: 04/29/2022 SLP Individual Time: 1400-1452 SLP Individual Time Calculation (min): 52 min  Short Term Goals: Week 1: SLP Short Term Goal 1 (Week 1): STGs=LTGs due to ELOS  Skilled Therapeutic Interventions: Pt seen this date for skilled ST intervention targeting cognitive goals outlined in care plan. Pt received awake/alert and OOB in w/c. Agreeable to intervention. Participatory throughout with encouragement.  Today's session with emphasis on functional IADL tasks. Completed counting money task with overall Mod to Mod-Max A verbal and visual cues for accurate counting, double checking, and self-correcting; <50% accuracy without cues.  Oriented x 3. Provided written and verbal review of current scheduled and PRN medications with pt unable to provide any details re: current medications. When given Min-Mod A verbal and visual cues, pt able to look at chart and provide reason for each medication. Completed BID pill organizer with Min A faded to Sup A as task progressed with no errors noted. States she was completing a BID pill organizer prior to admission and is amenable to practicing this skill in upcoming sessions.  Pt returned to room and left OOB in w/c with all safety measures activated and call bell within reach. Continue per current ST POC. Will plan to continue medication management tasks next session.   Pain No pain reported; NAD  Therapy/Group: Individual Therapy  Deborah Neal 04/29/2022, 2:55 PM

## 2022-04-29 NOTE — Progress Notes (Signed)
PROGRESS NOTE   Subjective/Complaints:  Admits therapy has mildly been impaired due to pain- chest wall and back pain.  Sleeping "wonderfully" per pt and daughter.  Still hurts in R arm where had IV-  CXR looks good- will reduce Lasix.    ROS:  Pt denies SOB, abd pain, CP, N/V/C/D, and vision changes  Except for HPI  Objective:   DG CHEST PORT 1 VIEW  Result Date: 04/29/2022 CLINICAL DATA:  Pulmonary vascular congestion EXAM: PORTABLE CHEST 1 VIEW COMPARISON:  Chest 04/25/2022 FINDINGS: Improvement in diffuse bilateral airspace disease. There remains bibasilar airspace disease right greater than left. Probable small pleural effusions. IMPRESSION: Improvement in bilateral airspace disease. Persistent bibasilar airspace disease right greater than left. Probable small effusions. Electronically Signed   By: Franchot Gallo M.D.   On: 04/29/2022 07:49   No results for input(s): "WBC", "HGB", "HCT", "PLT" in the last 72 hours.  Recent Labs    04/29/22 0523  NA 138  K 3.7  CL 102  CO2 27  GLUCOSE 242*  BUN 22  CREATININE 1.02*  CALCIUM 8.7*   No intake or output data in the 24 hours ending 04/29/22 0832    Pressure Injury 04/09/22 Sacrum Medial Stage 2 -  Partial thickness loss of dermis presenting as a shallow open injury with a red, pink wound bed without slough. open Blister to sacrum (Active)  04/09/22 1700  Location: Sacrum  Location Orientation: Medial  Staging: Stage 2 -  Partial thickness loss of dermis presenting as a shallow open injury with a red, pink wound bed without slough.  Wound Description (Comments): open Blister to sacrum  Present on Admission: Yes    Physical Exam: Vital Signs Blood pressure 131/70, pulse 79, temperature 98.7 F (37.1 C), resp. rate 18, height '5\' 3"'$  (1.6 m), weight 72.3 kg, SpO2 100 %.      General: awake, alert, appropriate, sitting up EOB; daughter in room; NAD HENT:  conjugate gaze; oropharynx a little dry CV: regular rate and rhythm; no JVD Pulmonary: CTA B/L; no W/R/R- good air movement- maybe slightly decreased at bases B/L  GI: soft, NT, ND, (+)BS Psychiatric: appropriate- decreased interaction- but been same since she got here.  Neurological: Ox3 but less interactive per her daughter   Extremities;  No LE edema at all B/L Musculoskeletal:     Cervical back: Neck supple. No tenderness.     Comments: 5/5 in B/L UE/arms- biceps, triceps, WE, grip and FA 5-/5 in B/L LE's- HF, KE, KF, DF and PF 5/5 B/L  Skin:    General: Skin is warm and dry.     Comments: R forearm IV- looks OK Neck irritation as above No significant LE edema- maybe trace in feet/ankles B/L  Neurological:     Mental Status: She is alert and oriented to person, place, and time.     Comments: Intact to light touch in all 4 extremities Ox3- able to answer 2-3 steps questions and follow 2 step commands  Assessment/Plan: 1. Functional deficits which require 3+ hours per day of interdisciplinary therapy in a comprehensive inpatient rehab setting. Physiatrist is providing close team supervision and 24 hour management of active  medical problems listed below. Physiatrist and rehab team continue to assess barriers to discharge/monitor patient progress toward functional and medical goals  Care Tool:  Bathing    Body parts bathed by patient: Right arm, Left arm, Chest, Abdomen, Front perineal area, Buttocks, Right upper leg, Left upper leg, Right lower leg, Left lower leg, Face         Bathing assist Assist Level: Set up assist     Upper Body Dressing/Undressing Upper body dressing   What is the patient wearing?: Bra, Pull over shirt    Upper body assist Assist Level: Set up assist    Lower Body Dressing/Undressing Lower body dressing      What is the patient wearing?: Underwear/pull up, Pants     Lower body assist Assist for lower body dressing: Set up assist      Toileting Toileting    Toileting assist Assist for toileting: Supervision/Verbal cueing     Transfers Chair/bed transfer  Transfers assist     Chair/bed transfer assist level: Minimal Assistance - Patient > 75%     Locomotion Ambulation   Ambulation assist      Assist level: Minimal Assistance - Patient > 75% Assistive device: Walker-rolling Max distance: 200 ft   Walk 10 feet activity   Assist     Assist level: Minimal Assistance - Patient > 75% Assistive device: Walker-rolling   Walk 50 feet activity   Assist    Assist level: Minimal Assistance - Patient > 75% Assistive device: Walker-rolling    Walk 150 feet activity   Assist    Assist level: Minimal Assistance - Patient > 75% Assistive device: Walker-rolling    Walk 10 feet on uneven surface  activity   Assist     Assist level: Moderate Assistance - Patient - 50 - 74% Assistive device: Aeronautical engineer Is the patient using a wheelchair?: Yes (transport for time management only) Type of Wheelchair: Manual           Wheelchair 50 feet with 2 turns activity    Assist        Assist Level: Dependent - Patient 0%   Wheelchair 150 feet activity     Assist      Assist Level: Dependent - Patient 0%   Blood pressure 131/70, pulse 79, temperature 98.7 F (37.1 C), resp. rate 18, height '5\' 3"'$  (1.6 m), weight 72.3 kg, SpO2 100 %.  Medical Problem List and Plan: 1. Functional deficits secondary to debility from cardiac arrest with CPR >2 minutes-              -patient may  shower             -ELOS/Goals: 12-16 days- mod I to supervision D/c date- 05/04/22  1/4- will con't CIR- PT and OT and SLP- CXR Monday to f/u on Lasix reduction 2.  Antithrombotics: -DVT/anticoagulation:  Pharmaceutical: Eliquis   1/4- R arm pain from prior IV- no infiltrate- since on Eliquis, no reason to check Doppler of RUE, I think.              -antiplatelet therapy:  none   3. Pain Management: Tylenol as needed             Sore ribs from CPR- might benefit from Lidoderm patches?  1/2- per daughter, is under-reportor- will add Lidoderm patches 3 patches 8pm to 8am- monitor closely- might need stronger meds.   1/3- still having some chest pain-  chest wall- however lidoderm was helpful overnight  1/4- therapy still impacted by chest/back pain- "mildly' per pt- will consider Norco  as needed due to increased risk of Seizures/seizure like activity initially.  4. Mood/Behavior/Sleep: LCSW to evaluate and provide emotional support             -antipsychotic agents: n/a   5. Neuropsych/cognition: This patient is capable of making decisions on her own behalf. Pt has mild anoxia per SLP- SLP seeing.   6. Skin/Wound Care: Routine skin care checks   7. Fluids/Electrolytes/Nutrition: routine Is and Os -carb modified diet -K phos 500 mg TID -Lasix continues for vascular congestion - edema almost resolved in legs- will follow IM recs on when to stop Lasix- maybe earlier if improves. 1/1- No LE edema, however has moderate vascular congestion on CXR- will wait to reduce Lasix.  1/3- will check CXR in AM 1/4- Looks much better- will reduce Lasix to 20 mg daily 8: Cardiac arrest, suspected PE s/p tenecteplase             -follow-up with Dr. Florene Glen   9: Seizure-like activity: Keppra discontinued   10: AKI: creatinine at baseline; follow-up BMP   1/1- Cr very slightly elevated at 1.02 and BUN 14- con't to monitor  1/4- Cr stable at 1.02- and BUN normal- con't regimen 11: Respiratory failure/pneumonia: completed antibiotics             -pulmonary toilet; albuterol nebs as needed             -Duoneb BID   1/4- Vascualr congestion has improved- small pleural effusion 12: Bilateral pleural effusions: repeat chest x-ray on 12/30>>improvement (see report above)             -diuresis continues with Lasix 40 mg daily   1/1- wait to reduce Lasix due to pulm vascular  congestion  1/3- will recheck CXR tomorrow  1/4- Small pleural effusions- but vascular congestion is almost gone- will reduce Lasix to 20 mg daily and recheck CXR early next week. Monitor edema 13: Anemia: Hemoglobin with downward trend: 7.6; recheck CBC    1/1- Hb up to 8.3-  14: Rib fractures: pain control   1/4- using lidoderm patches and tylenol 15: Hypertension: monitor TID and prn             continue  Lasix 40 mg daily             -continue Norvasc 7.5 mg daily   1/3- well controlled- con't regimen 16: DM/DKA: Ca1c >15.5 BGs q AC and q HS; (home meds: Jardiance 25 mg daily, Toujeo 100 units daily, Humulin R U500 60 units TID with meals)              -SSI             -Novolog 8 units with meals             -Semglee 18 units BID   1/1- CBGs 91 to 181- MUCH better- con't regimen for now  1/2- CBGs OK except 1 value of 331 last night- will check with pt if was snacking?  1/3- CBGs 64-149- will back off Semglee to 16 units BID- educated on getting Dexcom, etc outpt  1/4- CBGs 118-228- since decreased her Semglee due to multiple Cbgs of 60s- will maintain current regimen for now.  17: Left upper lobe atelectasis: Concern for chronic obstruction of left upper lobe bronchus             -  Recommend follow-up CT scan in 3 months versus other imaging   18: Hypokalemia: replete; follow-up BMP   1/1- K+ 3.7 19: Hyperlipidemia: Crestor   20: Hypomagnesemia: continue Mag-ox 400 mg daily; recheck mag 1/1 1/2- will increase Mg Ox to 400 mg BID 1/3- will recheck Mg Thursday 1/4- Mg 1.8- con't regimen 21: Elevated TSH: T3, T4 pending   1/1- T4 is normal at 0.81- on lower side, but still in normal limits; however TSH is 9.8- will start 75 mcg Synthroid since has hx of TSH being elevated as well as borderline high in past as well- pt also c/o being cold all the time and dry skin/hair falling out.  22. Urine incontinence  1/1- will get timed voiding- due to incontinence- likely due to using  Purewick so long, but will monitor  1/2- only overnight- likely due to [place]- will try timed voiding.   1/3- started timed voiding 23. Insomnia  1/2- Melatonin didn't work- will try Trazodone 25 mg QHS prn- and daughter will encourage her to take.   1/3- pt took 25 mg Trazodone- slept much better  1/4- sleeping great per daughter 17. Mild anoxia  1/3- due to cardiac arrest/need for CPR- ordered SLP eval and they have picked her up- will need reminders due to [facility] deficits.   I spent a total of  38  minutes on total care today- >50% coordination of care- due to prolonged time with pt/daughter; also independently reviewing CXR and labs.    LOS: 4 days A FACE TO FACE EVALUATION WAS PERFORMED  Deborah Neal 04/29/2022, 8:32 AM

## 2022-04-29 NOTE — Progress Notes (Signed)
Patient ID: Deborah Neal, female   DOB: 11/28/1950, 71 y.o.   MRN: [id number] Met with pt and daughter to update regarding co-pay she reports she paid the co-pay for the transport chair and bedside commode and wants delivered to home. She has found a tub bench. Has all needed equipment for home. Doing well and looking forward to discharge 1/9. 

## 2022-04-29 NOTE — Progress Notes (Signed)
Patient refuses CPAP 

## 2022-04-29 NOTE — Progress Notes (Signed)
Physical Therapy Session Note  Patient Details  Name: CHANDNI GAGAN MRN: [id number] Date of Birth: [birth year]  Today's Date: 04/29/2022 PT Individual Time: 0820-0902 PT Individual Time Calculation (min): 42 min   Short Term Goals: Week 1:  PT Short Term Goal 1 (Week 1): =LTGs d/t ELOS  Skilled Therapeutic Interventions/Progress Updates:      Therapy Documentation Precautions:  Precautions Precautions: Fall Restrictions Weight Bearing Restrictions: No  Pt eating breakfast and requested additional time to eat. On second arrival, pt agreeable to PT session. Pt SBA with sit to stand and gait ~180 ft to dayroom. Pt performed standing gastroc stretches on inclined wedge to address flexibility and ROM deficits. Pt performed 4 sets x 30 seconds. Pt required CGA with gait ~400 ft with no AD while additional assist utilized green theraband for resistance to facilitate increased posterior weight bearing and heel strike in stead of forefoot strike gait pattern. Pt required seated rest break and ambulated to room and left in w/c at bedside with daughter present, all needs in reach, and declines use of chair alarm.   Therapy/Group: Individual Therapy  Verl Dicker Verl Dicker PT, DPT  04/29/2022, 7:29 AM

## 2022-04-29 NOTE — Progress Notes (Signed)
Occupational Therapy Session Note  Patient Details  Name: Deborah Neal MRN: [id number] Date of Birth: 1951/03/05  Today's Date: 04/30/2022 OT Individual Time: 1001-1048 OT Individual Time Calculation (min): 47 min   Today's Date: 04/30/2022 OT Individual Time: 0233-4356 OT Individual Time Calculation (min): 26 min   Short Term Goals: Week 1:  OT Short Term Goal 1 (Week 1): STGs=LTGs due to patient's length of stay.  Skilled Therapeutic Interventions/Progress Updates:   Session 1: Pt received resting in bed for skilled OT session with focus on ADL/IADL retraining. Pt agreeable to interventions, despite demonstrating overall irritable mood. Pt with no reports of pain. OT offering intermediate rest breaks and positioning suggestions throughout session to potential address pain/fatigue and maximize participation/safety in session.   Pt dependent for WC transport to simulated apartment environment due to increased fatigue post-PT session. In apartment environment, pt correctly identifies 5/5 kitchen safety hazards with increased time and min cuing.   Session transitions to simulated bathroom environment in which pt and OT discuss DME options for home bathroom. Using shower chair, pt performs tub transfer x2 with use of grab bars. Pt requiring cuing for wider base of support/reducing fall risk, pt unreceptive of cuing/education continuing to step on her feet when stepping into/out off the tub. OT providing education on places to purchase shower chair and adjustable grab bar, pt stating "my daughter already bought that" in reference to shower chair.   In therapy gym, pt performs 1 set/10 reps of following exercises with 3lb DB. -Punches -Bicep curls  Pt remained seated in WC with all immediate needs met and lap belt alarmed at end of session. Pt continues to be appropriate for skilled OT intervention to promote further functional independence.   Session 2: Pt received seated in bed for skilled  OT session with focus on standing balance and functional mobility. Pt agreeable to interventions, demonstrating overall pleasant mood and no reports of pain. OT offering intermediate rest breaks and positioning suggestions throughout session to address potential pain/fatigue and maximize participation/safety in session.   Pt ambulating with close supervision from room <> day room. In day room, pt participates in table-top activity targeting standing balance with use of blue foam block. Pt with one instance of LOB upon stepping onto foam block (tripping with L-foot), recovering with Min A + use of table edge. Pt tolerates >5 mins of standing with overall SUP-CGA.   Pt then performs 2 set/10 repetitions of stepping onto 4-inch step for simulation of functional mobility over household thresholds. Pt completes with mod verbal cuing for wider base of support and to "look at your feet" as patient continued to place R-foot close to step edge. Pt with minimal adherence to prompts/cuing, but no LOB.   Pt remained seated on bed with visitor present, all immediate needs met, and bed alarm activiated at end of session. Pt continues to be appropriate for skilled OT intervention to promote further functional independence.    Therapy Documentation Precautions:  Precautions Precautions: Fall Restrictions Weight Bearing Restrictions: No   Therapy/Group: Individual Therapy  Maudie Mercury, OTR/L, MSOT  04/30/2022, 6:19 PM

## 2022-04-29 NOTE — Progress Notes (Addendum)
Occupational Therapy Session Note  Patient Details  Name: Deborah Neal MRN: [id number] Date of Birth: 12/13/1950  Today's Date: 04/29/2022 OT Individual Time: 1004-1101 OT Individual Time Calculation (min): 57 min   Today's Date: 04/29/2022 OT Individual Time: 1511-1542 OT Individual Time Calculation (min): 31 min   Short Term Goals: Week 1:  OT Short Term Goal 1 (Week 1): STGs=LTGs due to patient's length of stay.  Skilled Therapeutic Interventions/Progress Updates:   Session 1: Pt received seated in WC for skilled OT session with focus on functional mobility and cognition. Pt agreeable to interventions, demonstrating overall pleasant mood. Pt with un-rated pain in L-hand, stating "I've got arthritis in my hands and feet." OT offering intermediate rest breaks and positioning suggestions throughout session to address pain/fatigue and maximize participation/safety in session.   Pt engages in BITS modality, targeting short term-memory activities. Pt with decreased recall, scoring an average of 39%, correctly identifying the order of 2-3 words. Pt completes two trials, standing with supervision + NO AD.  In apartment, pt simulates simple meal prep with item retrieval, requiring overall close supervision for ambulation, but observed "furniture walking." Pt able to transport plate to table without AD and supervision. Pt and OT discuss home-kitchen layout/set-up and potential energy conservation techniques.   Pt walks from simulated apartment towards day room, making it to nurses station, dependent transport remainder of way due to fatigue.   In day room, pt participates in functional mobility activity, targeting short-term memory through having patient walk towards visual targets in a specific order. Pt completes multiple trials, successfully recalling the order of three colors, but being unable to recall the order of three-four numbers. Pt ambulates towards all visual targets with close  supervision + NO AD.   Pt remained seated in WC with all immediate needs met at end of session. Pt continues to be appropriate for skilled OT intervention to promote further functional independence.   Session 2: Pt received seated in WC for skilled OT session with focus on UE strengthening. Pt agreeable to interventions, demonstrating overall pleasant mood. Pt reported 4/10 pain in chest, "it's just a little bit." OT offering intermediate rest breaks and positioning suggestions throughout session to address pain/fatigue and maximize participation/safety in session.   Pt dependent for transport from room <>day room for time management. In day room, pt participates in series of UE strengthening exercises: -Horizontal Ab/duction  -Overhead Ab/duction -Diagonal Ab/duction -Bicep curls  Pt performs 2 sets/10 reps of each exercises with yellow thera-bad, requiring multimodal cuing for form.   Pt's vitals reading: HR=91, O2=96-98  Pt remained seated in WC with all immediate needs met and lap belt alarmed at end of session. Pt continues to be appropriate for skilled OT intervention to promote further functional independence.   Therapy Documentation Precautions:  Precautions Precautions: Fall Restrictions Weight Bearing Restrictions: No   Therapy/Group: Individual Therapy  Francy Pachon-Marin, OTR/L, MSOT  04/29/2022, 6:01 AM 

## 2022-04-29 NOTE — Progress Notes (Signed)
Initial Nutrition Assessment  DOCUMENTATION CODES:   Not applicable  INTERVENTION:   Continue Multivitamin w/ minerals daily Encourage good PO intake Continue Ensure Enlive po TID, each supplement provides 350 kcal and 20 grams of protein. Diet education 30 ml ProSource Plus BID, each supplement provides 100 kcals and 15 grams protein.   NUTRITION DIAGNOSIS:   Increased nutrient needs related to wound healing as evidenced by estimated needs.  GOAL:   Patient will meet greater than or equal to 90% of their needs  MONITOR:   PO intake, Supplement acceptance, Labs, Skin  REASON FOR ASSESSMENT:   Consult Wound healing  ASSESSMENT:   72 y.o. female admitted to CIR after cardiac arrest, DKA, and AKI. PMH includes HTN, HLD, and T2DM.  RD attempted to see pt x2, pt unavailable. Discussed case with RN. RN reports that pt ate at least 50% of breakfast and ate majority of the protein option on her tray. Per EMR, pt with no significant weight loss over the past year.  Pt currently with Ensure Enlive ordered -TID. Per chart, pt has been accepting all Ensures, although unsure how much pt has actually consumed. RD to order additional protein supplement to assist in wound healing.   Meal Intakes 12/31-01/03: 45-100% x 7 meals (average 77%)  Pt noted to have elevated Hgb A1c >15.5%. RD to place diet education material in AVS for discharge.   Medications reviewed and include: Lasix, NovoLog SSI + 8 units, Semglee, Magnesium oxide, MVI Labs reviewed: Hgb A1c >15.5%, 24 hr CBGs 118-228  NUTRITION - FOCUSED PHYSICAL EXAM:  Deferred to follow-up.  Diet Order:   Diet Order             Diet Carb Modified Fluid consistency: Thin; Room service appropriate? Yes  Diet effective now                  EDUCATION NEEDS:   Education needs have been addressed  Skin:  Skin Integrity Issues:: Stage II Stage II: Sacrum  Last BM:  1/3 - Type 4, large  Height:  Ht Readings from Last 1  Encounters:  04/25/22 '5\' 3"'$  (1.6 m)   Weight:  Wt Readings from Last 1 Encounters:  04/29/22 72.3 kg   Ideal Body Weight:  52.3 kg  BMI:  Body mass index is 28.24 kg/m.  Estimated Nutritional Needs:  Kcal:  2000-2200 Protein:  100-115 grams Fluid:  >/= 2 L   Hermina Barters RD, LDN Clinical Dietitian See [facility] for contact information.

## 2022-04-29 NOTE — Evaluation (Signed)
Recreational Therapy Assessment and Plan  Patient Details  Name: Deborah Neal MRN: [id number] Date of Birth: Dec 23, 1950 Today's Date: 04/29/2022  Rehab Potential:  Good ELOS:   d/c 1/9  Assessment Hospital Problem: Principal Problem:   Debility Active Problems:   Cardiac arrest [facility])     Past Medical History:      Past Medical History:  Diagnosis Date   ALLERGIC RHINITIS 01/29/2007   Arthritis     ASTHMA 01/29/2007   ASTHMA, WITH ACUTE EXACERBATION 06/24/2008   BACK PAIN 01/29/2009   CHEST PAIN 06/24/2008   Family history of adverse reaction to anesthesia      Patients daughter has N/V after anesthesia   GERD (gastroesophageal reflux disease)     History of shingles     HYPERLIPIDEMIA 01/29/2007   HYPERTENSION 01/29/2007   OSTEOPENIA 10/16/2007   Type II or unspecified type diabetes mellitus without mention of complication, uncontrolled 11/29/2013    Past Surgical History:       Past Surgical History:  Procedure Laterality Date   CHOLECYSTECTOMY N/A 09/19/2015    Procedure: LAPAROSCOPIC CHOLECYSTECTOMY WITH INTRAOPERATIVE CHOLANGIOGRAM;  Surgeon: Jackolyn Confer, MD;  Location: [place];  Service: General;  Laterality: N/A;   COLONOSCOPY       TONSILLECTOMY   1956   VIDEO BRONCHOSCOPY Bilateral 03/28/2014    Procedure: VIDEO BRONCHOSCOPY WITHOUT FLUORO;  Surgeon: Tanda Rockers, MD;  Location: [facility];  Service: Cardiopulmonary;  Laterality: Bilateral;      Assessment & Plan Clinical Impression: Deborah Neal is a 72 year old female whose family attempted to contact her on the phone morning of 04/09/2022.  When she did not answer they found her at home unresponsive.  This was called and upon arrival she lost her pulse and CPR was initiated.  Demonstrated seizure-like activity on route to emergency department which improved with benzodiazepine administration.  She was intubated and admitted to pulmonary critical care medicine.  Laboratory workup significant for lactic acidosis, acute kidney  injury, elevated troponins and a hemoglobin A1c of greater than 15.5%.  Unable to do CTA of the chest due to kidney function.  She was initially started on heparin infusion due to concern for pulmonary embolism.  She subsequently became hypotensive and required Levophed then went into cardiac arrest with ventricular tachycardia/ventricular fibrillation.  ROSC was achieved after 2 minutes.  She was treated with tenecteplase.  Extremity ultrasound negative for DVT.  She was treated for acute hypoxic respiratory failure with multifocal pneumonia secondary to Serratia marcescens.  After renal recovery, she underwent CT angiogram which showed an intraluminal filling defect and a small subsegmental branch in the right lower lobe suggesting pulmonary embolism.  She was extubated on 12/23.  She is currently receiving cefepime IV which is to be completed 12/29.  Lasix continues for diffuse body wall edema.  Her EEG was suggestive of severe to profound diffuse encephalopathy and MRI of the brain was without acute intracranial abnormality.  On Keppra, discontinued on 12/29. Diabetic coordinator consulted for insulin management.  Per medication administration record, patient started on Eliquis and heparin infusion discontinued 12/29. Cardiology consulted on 12/29 and echocardiogram performed. EF estimated at 60-65%. The patient requires inpatient physical medicine and rehabilitation evaluations and treatment secondary to dysfunction due to ability secondary to cardiac arrest, pneumonia.  Patient transferred to CIR on 04/25/2022.    Pt presents with decreased activity tolerance, decreased functional mobility, decreased balance, decreased attention Limiting pt's independence with leisure/community pursuits.  Met with pt today to discuss TR services  including leisure education, activity analysis/modifications and stress management.  Also discussed the importance of social, emotional, spiritual health in addition to physical  health and their effects on overall health and wellness.  Pt stated understanding.      Plan  No further TR as pt is expected to discharge 1/9  Recommendations for other services: None   Discharge Criteria: Patient will be discharged from TR if patient refuses treatment 3 consecutive times without medical reason.  If treatment goals not met, if there is a change in medical status, if patient makes no progress towards goals or if patient is discharged from hospital.  The above assessment, treatment plan, treatment alternatives and goals were discussed and mutually agreed upon: by patient  [place] 04/29/2022, 8:42 AM

## 2022-04-30 DIAGNOSIS — R5381 Other malaise: Secondary | ICD-10-CM | POA: Diagnosis not present

## 2022-04-30 LAB — GLUCOSE, CAPILLARY
Glucose-Capillary: 203 mg/dL — ABNORMAL HIGH (ref 70–99)
Glucose-Capillary: 90 mg/dL (ref 70–99)
Glucose-Capillary: 148 mg/dL — ABNORMAL HIGH (ref 70–99)
Glucose-Capillary: 138 mg/dL — ABNORMAL HIGH (ref 70–99)

## 2022-04-30 NOTE — Progress Notes (Signed)
Physical Therapy Session Note  Patient Details  Name: Deborah Neal MRN: [id number] Date of Birth: 04-Dec-1950  Today's Date: 04/30/2022 PT Individual Time: 5809-9833 PT Individual Time Calculation (min): 70 min   Short Term Goals: Week 1:  PT Short Term Goal 1 (Week 1): =LTGs d/t ELOS  Skilled Therapeutic Interventions/Progress Updates: Pt presented in bed agreeable to therapy. Pt states pain manageable at present. Pt performed bed mobility with supervision and use of bed features. Donned shoes with set up. Pt donned sweater coat with set up. Ambulated to day room with light CGA to close supervision. Pt noted to have mildly narrow BOS and intermittent scuffing of R foot. Pt participated in obstacle course including weaving through cones, stepping over thresholds, and stepping onto/over Airex. Pt able to perform consistently at [facility] to supervision. Pt also participated in several rounds of corn hole while standing on airex performing reaching tasks low/high/crossing midline. Pt participated in toe taps to 4in step 2 x 10 bilaterally for weight shifting and coordination. Pt participated in four square activities stepping clockwise/counter clockwise/diagonals with pt demonstrating most difficulty stepping backwards or stepping backwards to L. Participated in agility ladder sidestepping for hip strengthening with pt demonstrating noted difficulty clearing "rungs" despite cues for increased step length and tactile cues for maintaining hip forward. Pt participated in backwards walking 4 x 61f with HHA. Pt noted to have difficulty clearing L foot this time, able to correct with verbal cues but only able to maintain for 2-3 steps before audible scraping on ground. After extended seated rest pt ambulated back to room no AD but noted fatigue with CGA due to increased lateral sway. Pt returned to bed at end of session and left with bed alarm on, call bell within reach and needs met.      Therapy  Documentation Precautions:  Precautions Precautions: Fall Restrictions Weight Bearing Restrictions: No General:   Vital Signs:   Pain: Pain Assessment Pain Scale: 0-10 Pain Score: 0-No pain Mobility:   Locomotion :    Trunk/Postural Assessment :    Balance:   Exercises:   Other Treatments:      Therapy/Group: Individual Therapy  Xyla Leisner 04/30/2022, 12:50 PM

## 2022-04-30 NOTE — Progress Notes (Addendum)
Speech Language Pathology Daily Session Note  Patient Details  Name: Deborah Neal MRN: [id number] Date of Birth: 1951/03/10  Today's Date: 04/30/2022 SLP Individual Time: 1400-1500 SLP Individual Time Calculation (min): 60 min  Short Term Goals: Week 1: SLP Short Term Goal 1 (Week 1): STGs=LTGs due to ELOS  Skilled Therapeutic Interventions: Pt seen this date for skilled ST intervention targeting cognitive goals outlined in care plan. Pt received awake/alert and side-lying on bed, watching TV.  Agreeable to intervention at bedside. Participatory throughout.   Today's session with emphasis on functional IADL task completion r/t medication management. Pt benefited from Min-Mod A verbal cues for self-monitoring/correcting and recalling BID placement for current medication in BID pill organizer. Cues faded on second trial of BID medications. Demonstrated adequate and functional selective and alternating attention given Sup A. Pt reports she did not take OTC medications prior to admission. Additionally, completed hypothetical, verbal problem-solving with 80% accuracy given Min A faded to Sup A verbal cues. Located requested information on medication label with overall Min A. Diminished emergent awareness noted; required at least Mod A for education on need to prepare ahead of time (retrieve glasses). Reinforced need for supervision and assistance with all medication management related tasks; she verbalized understanding. Pt reports her granddaughter will be able to help supervise and assistance with pill box, and call the refills in to the pharmacy.  Prior to end of session, SLP prompted pt to toilet given she was past due for her timed toileting. Voided in toilet and transferred with CGA with RW for safety (see flowsheet for specifics). Required verbal cues to mitigate impulsivity.   Pt left in room with all safety measures activated and call bell within reach. Continue per current ST POC. Specifically,  focusing on functional IADL tasks to prepare for upcoming d/c home.   Pain No pain reported; NAD  Therapy/Group: Individual Therapy  Yafet Cline A Marka Treloar 04/30/2022, 3:10 PM

## 2022-04-30 NOTE — Progress Notes (Signed)
PROGRESS NOTE   Subjective/Complaints:  Pt reports breathing going well- Daughter went home to "take care of things".   Got a lollipop yesterday from grandson,  So if CBGs elevated, that's why.   Wants some stronger pain meds as needed.  C/o chest wall pain.   ROS:  Pt denies SOB, abd pain, CP, N/V/C/D, and vision changes   Except for HPI  Objective:   DG CHEST PORT 1 VIEW  Result Date: 04/29/2022 CLINICAL DATA:  Pulmonary vascular congestion EXAM: PORTABLE CHEST 1 VIEW COMPARISON:  Chest 04/25/2022 FINDINGS: Improvement in diffuse bilateral airspace disease. There remains bibasilar airspace disease right greater than left. Probable small pleural effusions. IMPRESSION: Improvement in bilateral airspace disease. Persistent bibasilar airspace disease right greater than left. Probable small effusions. Electronically Signed   By: Franchot Gallo M.D.   On: 04/29/2022 07:49   No results for input(s): "WBC", "HGB", "HCT", "PLT" in the last 72 hours.  Recent Labs    04/29/22 0523  NA 138  K 3.7  CL 102  CO2 27  GLUCOSE 242*  BUN 22  CREATININE 1.02*  CALCIUM 8.7*    Intake/Output Summary (Last 24 hours) at 04/30/2022 0856 Last data filed at 04/29/2022 1900 Gross per 24 hour  Intake 660 ml  Output --  Net 660 ml      Pressure Injury 04/09/22 Sacrum Medial Stage 2 -  Partial thickness loss of dermis presenting as a shallow open injury with a red, pink wound bed without slough. open Blister to sacrum (Active)  04/09/22 1700  Location: Sacrum  Location Orientation: Medial  Staging: Stage 2 -  Partial thickness loss of dermis presenting as a shallow open injury with a red, pink wound bed without slough.  Wound Description (Comments): open Blister to sacrum  Present on Admission: Yes    Physical Exam: Vital Signs Blood pressure 137/70, pulse 80, temperature 98 F (36.7 C), resp. rate 16, height '5\' 3"'$  (1.6 m), weight  72.3 kg, SpO2 100 %.       General: awake, alert, appropriate, sitting up in bed this AM; NAD HENT: conjugate gaze; oropharynx moist CV: regular rate and rhythm; no JVD Pulmonary: CTA B/L; no W/R/R- good air movement GI: soft, NT, ND, (+)BS Psychiatric: appropriate- flat, but a little more interactive today Neurological: alert    Extremities;  No LE edema at all B/L- no change Musculoskeletal:     Cervical back: Neck supple. No tenderness.     Comments: 5/5 in B/L UE/arms- biceps, triceps, WE, grip and FA 5-/5 in B/L LE's- HF, KE, KF, DF and PF 5/5 B/L  Skin:    General: Skin is warm and dry.     Comments: R forearm IV- looks OK Neck irritation as above No significant LE edema- maybe trace in feet/ankles B/L  Neurological:     Mental Status: She is alert and oriented to person, place, and time.     Comments: Intact to light touch in all 4 extremities Ox3- able to answer 2-3 steps questions and follow 2 step commands  Assessment/Plan: 1. Functional deficits which require 3+ hours per day of interdisciplinary therapy in a comprehensive inpatient rehab setting. Physiatrist  is providing close team supervision and 24 hour management of active medical problems listed below. Physiatrist and rehab team continue to assess barriers to discharge/monitor patient progress toward functional and medical goals  Care Tool:  Bathing    Body parts bathed by patient: Right arm, Left arm, Chest, Abdomen, Front perineal area, Buttocks, Right upper leg, Left upper leg, Right lower leg, Left lower leg, Face         Bathing assist Assist Level: Set up assist     Upper Body Dressing/Undressing Upper body dressing   What is the patient wearing?: Bra, Pull over shirt    Upper body assist Assist Level: Set up assist    Lower Body Dressing/Undressing Lower body dressing      What is the patient wearing?: Underwear/pull up, Pants     Lower body assist Assist for lower body dressing:  Set up assist     Toileting Toileting    Toileting assist Assist for toileting: Supervision/Verbal cueing     Transfers Chair/bed transfer  Transfers assist     Chair/bed transfer assist level: Minimal Assistance - Patient > 75%     Locomotion Ambulation   Ambulation assist      Assist level: Minimal Assistance - Patient > 75% Assistive device: Walker-rolling Max distance: 200 ft   Walk 10 feet activity   Assist     Assist level: Minimal Assistance - Patient > 75% Assistive device: Walker-rolling   Walk 50 feet activity   Assist    Assist level: Minimal Assistance - Patient > 75% Assistive device: Walker-rolling    Walk 150 feet activity   Assist    Assist level: Minimal Assistance - Patient > 75% Assistive device: Walker-rolling    Walk 10 feet on uneven surface  activity   Assist     Assist level: Moderate Assistance - Patient - 50 - 74% Assistive device: Aeronautical engineer Is the patient using a wheelchair?: Yes (transport for time management only) Type of Wheelchair: Manual           Wheelchair 50 feet with 2 turns activity    Assist        Assist Level: Dependent - Patient 0%   Wheelchair 150 feet activity     Assist      Assist Level: Dependent - Patient 0%   Blood pressure 137/70, pulse 80, temperature 98 F (36.7 C), resp. rate 16, height '5\' 3"'$  (1.6 m), weight 72.3 kg, SpO2 100 %.  Medical Problem List and Plan: 1. Functional deficits secondary to debility from cardiac arrest with CPR >2 minutes-              -patient may  shower             -ELOS/Goals: 12-16 days- mod I to supervision D/c date- 05/04/22  1/4- will con't CIR- PT and OT and SLP- CXR Monday to f/u on Lasix reduction  1/5- Con't CIR- PT and OT and SLP- will order CXR for Monday.  2.  Antithrombotics: -DVT/anticoagulation:  Pharmaceutical: Eliquis   1/4- R arm pain from prior IV- no infiltrate- since on Eliquis, no  reason to check Doppler of RUE, I think.              -antiplatelet therapy: none   3. Pain Management: Tylenol as needed             Sore ribs from CPR- might benefit from Lidoderm patches?  1/2- per  daughter, is under-reportor- will add Lidoderm patches 3 patches 8pm to 8am- monitor closely- might need stronger meds.   1/3- still having some chest pain- chest wall- however lidoderm was helpful overnight  1/4- therapy still impacted by chest/back pain- "mildly' per pt- will consider Norco  as needed due to increased risk of Seizures/seizure like activity initially.   1/5- will order Norco prn for severe pain.  4. Mood/Behavior/Sleep: LCSW to evaluate and provide emotional support             -antipsychotic agents: n/a   5. Neuropsych/cognition: This patient is capable of making decisions on her own behalf. Pt has mild anoxia per SLP- SLP seeing.   6. Skin/Wound Care: Routine skin care checks   7. Fluids/Electrolytes/Nutrition: routine Is and Os -carb modified diet -K phos 500 mg TID -Lasix continues for vascular congestion - edema almost resolved in legs- will follow IM recs on when to stop Lasix- maybe earlier if improves. 1/1- No LE edema, however has moderate vascular congestion on CXR- will wait to reduce Lasix.  1/3- will check CXR in AM 1/4- Looks much better- will reduce Lasix to 20 mg daily 1/5- will order f/u CXR for Monday AM 8: Cardiac arrest, suspected PE s/p tenecteplase             -follow-up with Dr. Florene Glen   9: Seizure-like activity: Keppra discontinued   10: AKI: creatinine at baseline; follow-up BMP   1/1- Cr very slightly elevated at 1.02 and BUN 14- con't to monitor  1/4- Cr stable at 1.02- and BUN normal- con't regimen 11: Respiratory failure/pneumonia: completed antibiotics             -pulmonary toilet; albuterol nebs as needed             -Duoneb BID   1/4- Vascualr congestion has improved- small pleural effusion 12: Bilateral pleural effusions: repeat  chest x-ray on 12/30>>improvement (see report above)             -diuresis continues with Lasix 40 mg daily   1/1- wait to reduce Lasix due to pulm vascular congestion  1/3- will recheck CXR tomorrow  1/4- Small pleural effusions- but vascular congestion is almost gone- will reduce Lasix to 20 mg daily and recheck CXR early next week. Monitor edema 13: Anemia: Hemoglobin with downward trend: 7.6; recheck CBC    1/1- Hb up to 8.3-  14: Rib fractures: pain control   1/4- using lidoderm patches and tylenol 15: Hypertension: monitor TID and prn             continue  Lasix 40 mg daily             -continue Norvasc 7.5 mg daily   1/3- well controlled- con't regimen 16: DM/DKA: Ca1c >15.5 BGs q AC and q HS; (home meds: Jardiance 25 mg daily, Toujeo 100 units daily, Humulin R U500 60 units TID with meals)              -SSI             -Novolog 8 units with meals             -Semglee 18 units BID   1/1- CBGs 91 to 181- MUCH better- con't regimen for now  1/2- CBGs OK except 1 value of 331 last night- will check with pt if was snacking?  1/3- CBGs 64-149- will back off Semglee to 16 units BID- educated on getting Dexcom, etc outpt  1/4- CBGs 118-228- since decreased her Semglee due to multiple Cbgs of 60s- will maintain current regimen for now.   1/5- CBGs 97-203- but also had lollipop yesterday from grandson.  17: Left upper lobe atelectasis: Concern for chronic obstruction of left upper lobe bronchus             -Recommend follow-up CT scan in 3 months versus other imaging   18: Hypokalemia: replete; follow-up BMP   1/1- K+ 3.7 19: Hyperlipidemia: Crestor   20: Hypomagnesemia: continue Mag-ox 400 mg daily; recheck mag 1/1 1/2- will increase Mg Ox to 400 mg BID 1/3- will recheck Mg Thursday 1/4- Mg 1.8- con't regimen 21: Elevated TSH: T3, T4 pending   1/1- T4 is normal at 0.81- on lower side, but still in normal limits; however TSH is 9.8- will start 75 mcg Synthroid since has hx of TSH  being elevated as well as borderline high in past as well- pt also c/o being cold all the time and dry skin/hair falling out.  22. Urine incontinence  1/1- will get timed voiding- due to incontinence- likely due to using Purewick so long, but will monitor  1/2- only overnight- likely due to [place]- will try timed voiding.   1/3- started timed voiding 23. Insomnia  1/2- Melatonin didn't work- will try Trazodone 25 mg QHS prn- and daughter will encourage her to take.   1/3- pt took 25 mg Trazodone- slept much better  1/4- sleeping great per daughter 39. Mild anoxia  1/3- due to cardiac arrest/need for CPR- ordered SLP eval and they have picked her up- will need reminders due to [facility] deficits.   I spent a total of 35   minutes on total care today- >50% coordination of care- due to d/w nursing about pt's CXR, labs, and STM deficits    LOS: 5 days A FACE TO FACE EVALUATION WAS PERFORMED  Dmarcus Decicco 04/30/2022, 8:56 AM

## 2022-05-01 LAB — GLUCOSE, CAPILLARY
Glucose-Capillary: 171 mg/dL — ABNORMAL HIGH (ref 70–99)
Glucose-Capillary: 115 mg/dL — ABNORMAL HIGH (ref 70–99)
Glucose-Capillary: 263 mg/dL — ABNORMAL HIGH (ref 70–99)

## 2022-05-01 NOTE — Progress Notes (Addendum)
Occupational Therapy Discharge Summary  Patient Details  Name: Deborah Neal MRN: [id number] Date of Birth: [birth year]  Date of Discharge from OT service:{Time; dates multiple:304500300}  {CHL IP REHAB OT TIME CALCULATIONS:304400400}   Patient has met 8 of 8 long term goals due to improved activity tolerance, improved balance, postural control, ability to compensate for deficits, functional use of  RIGHT upper and RIGHT lower extremity, improved attention, improved awareness, and improved coordination.  Patient to discharge at overall Modified Independent level for BADL's and BIADL's.  Patient's care partner is independent to provide the necessary physical and cognitive assistance at discharge.    Reasons goals not met: n/a  Recommendation:  Patient will benefit from ongoing skilled OT services in home health setting to continue to advance functional skills in the area of BADL, iADL, and Reduce care partner burden.  Equipment: TTB, 3 in 1 commode, family purchasing suction grab bar, issued LH sponge   Reasons for discharge: treatment goals met  Patient/family agrees with progress made and goals achieved: Yes  OT Discharge Precautions/Restrictions    General   Vital Signs Therapy Vitals Temp: 98.7 F (37.1 C) Temp Source: Oral Pulse Rate: 78 Resp: 18 BP: 119/66 Patient Position (if appropriate): Lying Oxygen Therapy SpO2: 100 % O2 Device: Room Air Pain   ADL ADL Eating: Independent Where Assessed-Eating: Wheelchair Grooming: Supervision/safety Where Assessed-Grooming: Wheelchair Upper Body Bathing: Setup Where Assessed-Upper Body Bathing: Shower Lower Body Bathing: Setup Where Assessed-Lower Body Bathing: Shower Upper Body Dressing: Supervision/safety Where Assessed-Upper Body Dressing: Edge of bed Lower Body Dressing: Supervision/safety Where Assessed-Lower Body Dressing: Edge of bed Toileting: Supervision/safety Where Assessed-Toileting: Contractor: Therapist, music Method: Counselling psychologist: Geophysical data processor: Curator Method: Heritage manager: Environmental health practitioner    Mobility     Trunk/Postural Assessment     Balance   Extremity/Trunk Assessment       Barnabas Lister 05/01/2022, 7:27 AM

## 2022-05-01 NOTE — Progress Notes (Signed)
Patient refused CPAP for the night  

## 2022-05-01 NOTE — Progress Notes (Signed)
Occupational Therapy Session Note  Patient Details  Name: Deborah Neal MRN: [id number] Date of Birth: Sep 20, 1950  Today's Date: 05/01/2022 OT Individual Time: 0459-9774 OT Individual Time Calculation (min): 45 min    Short Term Goals: Week 1:  OT Short Term Goal 1 (Week 1): STGs=LTGs due to patient's length of stay.  Skilled Therapeutic Interventions/Progress Updates:   Pt seen for skilled OT session with focus on safety, balance and overall activity tolerance with full am self care incl shower retraining. Pt able to amb without AD and S overall in room to retrieve ADL items and transport to stall shower. [place]ing clothes seated and with grab bar with dist S. Seated level UB/LB bathing with grab bar support with dist S. OT issued LH sponge and demo use for d/c. Pt dressed completely including socks and tennis shoes on commode atop toilet with S. Seated in w/c pt able to complete simple B UE tband in all planes and light theraputty for intrinsic strength Bly with 10 beads retrieved.  Written UE HEP reviewed and provided for discharge early next week. No SOB nor pain reported throughout session. Pt A, Ox4 and no evidence of impulsivity or distractibility during session for BADL's this visit. Left pt EOB wihth bed exit alarm engaged, all needs and call button in reach.   Therapy Documentation Precautions:  Precautions Precautions: Fall Restrictions Weight Bearing Restrictions: No    Therapy/Group: Individual Therapy  Barnabas Lister 05/01/2022, 7:27 AM

## 2022-05-01 NOTE — Progress Notes (Signed)
PROGRESS NOTE   Subjective/Complaints: No new complaints this morning Patient's chart reviewed- No issues reported overnight Vitals signs stable   ROS:  Pt denies SOB, abd pain, CP, N/V/C/D, and vision changes  Objective:   No results found. No results for input(s): "WBC", "HGB", "HCT", "PLT" in the last 72 hours.  Recent Labs    04/29/22 0523  NA 138  K 3.7  CL 102  CO2 27  GLUCOSE 242*  BUN 22  CREATININE 1.02*  CALCIUM 8.7*    Intake/Output Summary (Last 24 hours) at 05/01/2022 1539 Last data filed at 04/30/2022 1830 Gross per 24 hour  Intake 180 ml  Output --  Net 180 ml      Pressure Injury 04/09/22 Sacrum Medial Stage 2 -  Partial thickness loss of dermis presenting as a shallow open injury with a red, pink wound bed without slough. open Blister to sacrum (Active)  04/09/22 1700  Location: Sacrum  Location Orientation: Medial  Staging: Stage 2 -  Partial thickness loss of dermis presenting as a shallow open injury with a red, pink wound bed without slough.  Wound Description (Comments): open Blister to sacrum  Present on Admission: Yes    Physical Exam: Vital Signs Blood pressure 132/63, pulse 89, temperature 98.4 F (36.9 C), resp. rate 18, height '5\' 3"'$  (1.6 m), weight 73.2 kg, SpO2 96 %.     Gen: no distress, normal appearing HEENT: oral mucosa pink and moist, NCAT Cardio: Reg rate Chest: normal effort, normal rate of breathing Abd: soft, non-distended Extremities;  No LE edema at all B/L- no change Musculoskeletal:     Cervical back: Neck supple. No tenderness.     Comments: 5/5 in B/L UE/arms- biceps, triceps, WE, grip and FA 5-/5 in B/L LE's- HF, KE, KF, DF and PF 5/5 B/L  Skin:    General: Skin is warm and dry.     Comments: R forearm IV- looks OK Neck irritation as above No significant LE edema- maybe trace in feet/ankles B/L  Neurological:     Mental Status: She is alert and  oriented to person, place, and time.     Comments: Intact to light touch in all 4 extremities Ox3- able to answer 2-3 steps questions and follow 2 step commands  Assessment/Plan: 1. Functional deficits which require 3+ hours per day of interdisciplinary therapy in a comprehensive inpatient rehab setting. Physiatrist is providing close team supervision and 24 hour management of active medical problems listed below. Physiatrist and rehab team continue to assess barriers to discharge/monitor patient progress toward functional and medical goals  Care Tool:  Bathing    Body parts bathed by patient: Right arm, Left arm, Chest, Abdomen, Front perineal area, Buttocks, Right upper leg, Left upper leg, Right lower leg, Left lower leg, Face         Bathing assist Assist Level: Set up assist     Upper Body Dressing/Undressing Upper body dressing   What is the patient wearing?: Bra, Pull over shirt    Upper body assist Assist Level: Set up assist    Lower Body Dressing/Undressing Lower body dressing      What is the patient wearing?:  Underwear/pull up, Pants     Lower body assist Assist for lower body dressing: Set up assist     Toileting Toileting    Toileting assist Assist for toileting: Independent with assistive device     Transfers Chair/bed transfer  Transfers assist     Chair/bed transfer assist level: Minimal Assistance - Patient > 75%     Locomotion Ambulation   Ambulation assist      Assist level: Minimal Assistance - Patient > 75% Assistive device: Walker-rolling Max distance: 200 ft   Walk 10 feet activity   Assist     Assist level: Minimal Assistance - Patient > 75% Assistive device: Walker-rolling   Walk 50 feet activity   Assist    Assist level: Minimal Assistance - Patient > 75% Assistive device: Walker-rolling    Walk 150 feet activity   Assist    Assist level: Minimal Assistance - Patient > 75% Assistive device:  Walker-rolling    Walk 10 feet on uneven surface  activity   Assist     Assist level: Moderate Assistance - Patient - 50 - 74% Assistive device: Aeronautical engineer Is the patient using a wheelchair?: Yes (transport for time management only) Type of Wheelchair: Manual           Wheelchair 50 feet with 2 turns activity    Assist        Assist Level: Dependent - Patient 0%   Wheelchair 150 feet activity     Assist      Assist Level: Dependent - Patient 0%   Blood pressure 132/63, pulse 89, temperature 98.4 F (36.9 C), resp. rate 18, height '5\' 3"'$  (1.6 m), weight 73.2 kg, SpO2 96 %.  Medical Problem List and Plan: 1. Functional deficits secondary to debility from cardiac arrest with CPR >2 minutes-              -patient may  shower             -ELOS/Goals: 12-16 days- mod I to supervision D/c date- 05/04/22  1/4- will con't CIR- PT and OT and SLP- CXR Monday to f/u on Lasix reduction  Continue CIR, will order CXR for Monday.  2.  Antithrombotics: -DVT/anticoagulation:  Pharmaceutical: Eliquis   1/4- R arm pain from prior IV- no infiltrate- since on Eliquis, no reason to check Doppler of RUE, I think.              -antiplatelet therapy: none   3. Pain Management: continue Tylenol as needed             Sore ribs from CPR- might benefit from Lidoderm patches?  1/2- per daughter, is under-reportor- will add Lidoderm patches 3 patches 8pm to 8am- monitor closely- might need stronger meds.   1/3- still having some chest pain- chest wall- however lidoderm was helpful overnight  1/4- therapy still impacted by chest/back pain- "mildly' per pt- will consider Norco  as needed due to increased risk of Seizures/seizure like activity initially.   1/5- will order Norco prn for severe pain.  4. Mood/Behavior/Sleep: LCSW to evaluate and provide emotional support             -antipsychotic agents: n/a   5. Neuropsych/cognition: This patient is  capable of making decisions on her own behalf. Pt has mild anoxia per SLP- SLP seeing.   6. Skin/Wound Care: Routine skin care checks   7. Fluids/Electrolytes/Nutrition: routine Is and Os -carb modified diet -  K phos 500 mg TID -Lasix continues for vascular congestion - edema almost resolved in legs- will follow IM recs on when to stop Lasix- maybe earlier if improves. 1/1- No LE edema, however has moderate vascular congestion on CXR- will wait to reduce Lasix.  1/3- will check CXR in AM 1/4- Looks much better- will reduce Lasix to 20 mg daily 1/5- will order f/u CXR for Monday AM 8: Cardiac arrest, suspected PE s/p tenecteplase             -follow-up with Dr. Florene Glen   9: Seizure-like activity: Keppra discontinued   10: AKI: creatinine at baseline; follow-up BMP   1/1- Cr very slightly elevated at 1.02 and BUN 14- con't to monitor  1/4- Cr stable at 1.02- and BUN normal- con't regimen 11: Respiratory failure/pneumonia: completed antibiotics             -pulmonary toilet; albuterol nebs as needed             -continue Duoneb BID   1/4- Vascualr congestion has improved- small pleural effusion 12: Bilateral pleural effusions: repeat chest x-ray on 12/30>>improvement (see report above)             -diuresis continues with Lasix 40 mg daily   1/1- wait to reduce Lasix due to pulm vascular congestion  1/3- will recheck CXR tomorrow  1/4- Small pleural effusions- but vascular congestion is almost gone- will reduce Lasix to 20 mg daily and recheck CXR early next week. Monitor edema 13: Anemia: Hemoglobin with downward trend: 7.6; recheck CBC    1/1- Hb up to 8.3-  14: Rib fractures: pain control   1/4- using lidoderm patches and tylenol 15: Hypertension: monitor TID and prn             continue  Lasix 40 mg daily             -continue Norvasc 7.5 mg daily   1/3- well controlled- con't regimen 16: DM/DKA: Ca1c >15.5 BGs q AC and q HS; (home meds: Jardiance 25 mg daily, Toujeo 100 units  daily, Humulin R U500 60 units TID with meals)              -SSI             -Novolog 8 units with meals             -Semglee 18 units BID   1/1- CBGs 91 to 181- MUCH better- con't regimen for now  1/2- CBGs OK except 1 value of 331 last night- will check with pt if was snacking?  1/3- CBGs 64-149- will back off Semglee to 16 units BID- educated on getting Dexcom, etc outpt  1/4- CBGs 118-228- since decreased her Semglee due to multiple Cbgs of 60s- will maintain current regimen for now.   1/5- CBGs 97-203- but also had lollipop yesterday from grandson.  17: Left upper lobe atelectasis: Concern for chronic obstruction of left upper lobe bronchus             -Recommend follow-up CT scan in 3 months versus other imaging   18: Hypokalemia: replete; follow-up BMP   1/1- K+ 3.7 19: Hyperlipidemia: Crestor   20: Hypomagnesemia: continue Mag-ox 400 mg daily; recheck mag 1/1 1/2- will increase Mg Ox to 400 mg BID 1/3- will recheck Mg Thursday 1/4- Mg 1.8- con't regimen 21: Elevated TSH: T3, T4 pending   1/1- T4 is normal at 0.81- on lower side, but still in normal limits;  however TSH is 9.8- will start 75 mcg Synthroid since has hx of TSH being elevated as well as borderline high in past as well- pt also c/o being cold all the time and dry skin/hair falling out.  22. Urine incontinence  1/1- will get timed voiding- due to incontinence- likely due to using Purewick so long, but will monitor  1/2- only overnight- likely due to [place]- will try timed voiding.   1/3- started timed voiding 23. Insomnia  1/2- Melatonin didn't work- will try Trazodone 25 mg QHS prn- and daughter will encourage her to take.   1/3- pt took 25 mg Trazodone- slept much better  1/4- sleeping great per daughter 5. Mild anoxia  1/3- due to cardiac arrest/need for CPR- ordered SLP eval and they have picked her up- will need reminders due to [facility] deficits.      LOS: 6 days A FACE TO FACE EVALUATION WAS  PERFORMED  Izora Ribas 05/01/2022, 3:39 PM

## 2022-05-02 LAB — GLUCOSE, CAPILLARY
Glucose-Capillary: 170 mg/dL — ABNORMAL HIGH (ref 70–99)
Glucose-Capillary: 102 mg/dL — ABNORMAL HIGH (ref 70–99)
Glucose-Capillary: 173 mg/dL — ABNORMAL HIGH (ref 70–99)
Glucose-Capillary: 152 mg/dL — ABNORMAL HIGH (ref 70–99)

## 2022-05-02 NOTE — Progress Notes (Signed)
Pt refuses CPAP for tonight. States she does not wear one at home.

## 2022-05-02 NOTE — Progress Notes (Signed)
PROGRESS NOTE   Subjective/Complaints: No new complaints this morning Tolerated PT well today Refused CPAP last night No other issues overnight  ROS:  Pt denies SOB, abd pain, CP, N/V/C/D, and vision changes  Objective:   No results found. No results for input(s): "WBC", "HGB", "HCT", "PLT" in the last 72 hours.  No results for input(s): "NA", "K", "CL", "CO2", "GLUCOSE", "BUN", "CREATININE", "CALCIUM" in the last 72 hours.   Intake/Output Summary (Last 24 hours) at 05/02/2022 1438 Last data filed at 05/02/2022 3614 Gross per 24 hour  Intake 956 ml  Output --  Net 956 ml      Pressure Injury 04/09/22 Sacrum Medial Stage 2 -  Partial thickness loss of dermis presenting as a shallow open injury with a red, pink wound bed without slough. open Blister to sacrum (Active)  04/09/22 1700  Location: Sacrum  Location Orientation: Medial  Staging: Stage 2 -  Partial thickness loss of dermis presenting as a shallow open injury with a red, pink wound bed without slough.  Wound Description (Comments): open Blister to sacrum  Present on Admission: Yes    Physical Exam: Vital Signs Blood pressure (!) 114/57, pulse 88, temperature 98.4 F (36.9 C), resp. rate 18, height '5\' 3"'$  (1.6 m), weight 74.3 kg, SpO2 95 %.  Gen: no distress, normal appearing HEENT: oral mucosa pink and moist, NCAT Cardio: Reg rate Chest: normal effort, normal rate of breathing Abd: soft, non-distended Extremities;  No LE edema at all B/L- no change Musculoskeletal:     Cervical back: Neck supple. No tenderness.     Comments: 5/5 in B/L UE/arms- biceps, triceps, WE, grip and FA 5-/5 in B/L LE's- HF, KE, KF, DF and PF 5/5 B/L  Skin:    General: Skin is warm and dry.     Comments: R forearm IV- looks OK Neck irritation as above No significant LE edema- maybe trace in feet/ankles B/L  Neurological:     Mental Status: She is alert and oriented to  person, place, and time.     Comments: Intact to light touch in all 4 extremities Ox3- able to answer 2-3 steps questions and follow 2 step commands  Assessment/Plan: 1. Functional deficits which require 3+ hours per day of interdisciplinary therapy in a comprehensive inpatient rehab setting. Physiatrist is providing close team supervision and 24 hour management of active medical problems listed below. Physiatrist and rehab team continue to assess barriers to discharge/monitor patient progress toward functional and medical goals  Care Tool:  Bathing    Body parts bathed by patient: Right arm, Left arm, Chest, Abdomen, Front perineal area, Buttocks, Right upper leg, Left upper leg, Right lower leg, Left lower leg, Face         Bathing assist Assist Level: Set up assist     Upper Body Dressing/Undressing Upper body dressing   What is the patient wearing?: Bra, Pull over shirt    Upper body assist Assist Level: Set up assist    Lower Body Dressing/Undressing Lower body dressing      What is the patient wearing?: Underwear/pull up, Pants     Lower body assist Assist for lower body dressing: Set  up assist     Toileting Toileting    Toileting assist Assist for toileting: Independent with assistive device     Transfers Chair/bed transfer  Transfers assist     Chair/bed transfer assist level: Minimal Assistance - Patient > 75%     Locomotion Ambulation   Ambulation assist      Assist level: Minimal Assistance - Patient > 75% Assistive device: Walker-rolling Max distance: 200 ft   Walk 10 feet activity   Assist     Assist level: Minimal Assistance - Patient > 75% Assistive device: Walker-rolling   Walk 50 feet activity   Assist    Assist level: Minimal Assistance - Patient > 75% Assistive device: Walker-rolling    Walk 150 feet activity   Assist    Assist level: Minimal Assistance - Patient > 75% Assistive device: Walker-rolling     Walk 10 feet on uneven surface  activity   Assist     Assist level: Moderate Assistance - Patient - 50 - 74% Assistive device: Aeronautical engineer Is the patient using a wheelchair?: Yes (transport for time management only) Type of Wheelchair: Manual           Wheelchair 50 feet with 2 turns activity    Assist        Assist Level: Dependent - Patient 0%   Wheelchair 150 feet activity     Assist      Assist Level: Dependent - Patient 0%   Blood pressure (!) 114/57, pulse 88, temperature 98.4 F (36.9 C), resp. rate 18, height '5\' 3"'$  (1.6 m), weight 74.3 kg, SpO2 95 %.  Medical Problem List and Plan: 1. Functional deficits secondary to debility from cardiac arrest with CPR >2 minutes-              -patient may  shower             -ELOS/Goals: 12-16 days- mod I to supervision D/c date- 05/04/22  1/4- will con't CIR- PT and OT and SLP- CXR Monday to f/u on Lasix reduction  Continue CIR, will order CXR for Monday.  2.  Antithrombotics: -DVT/anticoagulation:  Pharmaceutical: Eliquis   1/4- R arm pain from prior IV- no infiltrate- since on Eliquis, no reason to check Doppler of RUE, I think.              -antiplatelet therapy: none   3. Pain Management: continue Tylenol as needed             Sore ribs from CPR- might benefit from Lidoderm patches?  1/2- per daughter, is under-reportor- will add Lidoderm patches 3 patches 8pm to 8am- monitor closely- might need stronger meds.   1/3- still having some chest pain- chest wall- however lidoderm was helpful overnight  1/4- therapy still impacted by chest/back pain- "mildly' per pt- will consider Norco  as needed due to increased risk of Seizures/seizure like activity initially.   1/5- will order Norco prn for severe pain.  4. Mood/Behavior/Sleep: LCSW to evaluate and provide emotional support             -antipsychotic agents: n/a   5. Neuropsych/cognition: This patient is capable of making  decisions on her own behalf. Pt has mild anoxia per SLP- SLP seeing.   6. Skin/Wound Care: Routine skin care checks   7. Fluids/Electrolytes/Nutrition: routine Is and Os -carb modified diet -K phos 500 mg TID -Lasix continues for vascular congestion - edema almost resolved in  legs- will follow IM recs on when to stop Lasix- maybe earlier if improves. 1/1- No LE edema, however has moderate vascular congestion on CXR- will wait to reduce Lasix.  1/3- will check CXR in AM 1/4- Looks much better- will reduce Lasix to 20 mg daily 1/5- will order f/u CXR for Monday AM 8: Cardiac arrest, suspected PE s/p tenecteplase             -follow-up with Dr. Florene Glen   9: Seizure-like activity: Keppra discontinued   10: AKI: creatinine at baseline; follow-up BMP   1/1- Cr very slightly elevated at 1.02 and BUN 14- con't to monitor  1/4- Cr stable at 1.02- and BUN normal- con't regimen 11: Respiratory failure/pneumonia: completed antibiotics             -pulmonary toilet; albuterol nebs as needed             -continue Duoneb BID   1/4- Vascualr congestion has improved- small pleural effusion 12: Bilateral pleural effusions: repeat chest x-ray on 12/30>>improvement (see report above)             -diuresis continues with Lasix 40 mg daily   1/1- wait to reduce Lasix due to pulm vascular congestion  1/3- will recheck CXR tomorrow  1/4- Small pleural effusions- but vascular congestion is almost gone- will reduce Lasix to 20 mg daily and recheck CXR early next week. Monitor edema 13: Anemia: Hemoglobin with downward trend: 7.6; recheck CBC    1/1- Hb up to 8.3-  14: Rib fractures: pain control   1/4- using lidoderm patches and tylenol 15: Hypertension: monitor TID and prn             continue  Lasix 40 mg daily             -continue Norvasc 7.5 mg daily   1/3- well controlled- con't regimen 16: DM/DKA: Ca1c >15.5 BGs q AC and q HS; (home meds: Jardiance 25 mg daily, Toujeo 100 units daily, Humulin R  U500 60 units TID with meals)              -SSI             -Novolog 8 units with meals             -Semglee 18 units BID   1/1- CBGs 91 to 181- MUCH better- con't regimen for now  1/2- CBGs OK except 1 value of 331 last night- will check with pt if was snacking?  1/3- CBGs 64-149- will back off Semglee to 16 units BID- educated on getting Dexcom, etc outpt  1/4- CBGs 118-228- since decreased her Semglee due to multiple Cbgs of 60s- will maintain current regimen for now.   1/5- CBGs 97-203- but also had lollipop yesterday from grandson.  17: Left upper lobe atelectasis: Concern for chronic obstruction of left upper lobe bronchus             -Recommend follow-up CT scan in 3 months versus other imaging   18: Hypokalemia: replete; follow-up BMP   1/1- K+ 3.7 19: Hyperlipidemia: Crestor   20: Hypomagnesemia: continue Mag-ox 400 mg daily; recheck mag 1/1 1/2- will increase Mg Ox to 400 mg BID 1/3- will recheck Mg Thursday 1/4- Mg 1.8- con't regimen 1/7: recheck ordered for tomorrow 21: Elevated TSH: T3, T4 pending   1/1- T4 is normal at 0.81- on lower side, but still in normal limits; however TSH is 9.8- will start 75 mcg Synthroid since  has hx of TSH being elevated as well as borderline high in past as well- pt also c/o being cold all the time and dry skin/hair falling out.  22. Urine incontinence  1/1- will get timed voiding- due to incontinence- likely due to using Purewick so long, but will monitor  1/2- only overnight- likely due to [place]- will try timed voiding.   Continue timed voiding 23. Insomnia  1/2- Melatonin didn't work- will try Trazodone 25 mg QHS prn- and daughter will encourage her to take.   1/3- pt took 25 mg Trazodone- slept much better  1/4- sleeping great per daughter  1/7: continue trazodone 24. Mild anoxia  1/3- due to cardiac arrest/need for CPR- ordered SLP eval and they have picked her up- will need reminders due to [facility] deficits.      LOS: 7 days A  FACE TO FACE EVALUATION WAS PERFORMED  Clide Deutscher Patrina Andreas 05/02/2022, 2:38 PM

## 2022-05-02 NOTE — Progress Notes (Signed)
Physical Therapy Session Note  Patient Details  Name: Deborah Neal MRN: [id number] Date of Birth: 1951-02-02  Today's Date: 05/02/2022 PT Individual Time: 1105-1202 PT Individual Time Calculation (min): 57 min   Short Term Goals: Week 1:  PT Short Term Goal 1 (Week 1): =LTGs d/t ELOS  Skilled Therapeutic Interventions/Progress Updates:      Therapy Documentation Precautions:  Precautions Precautions: Fall Restrictions Weight Bearing Restrictions: No  Pt agreeable to PT session with emphasis on high level gait and balance training. Pt (S) with bed mobility, STS and toilet transfer. Pt ambulated to dayroom no AD with verbal cues for increased foot clearance and posterior weight shift, pt able to self-correct and unable to maintain. Pt ambulated with PT positioned posterior while pt held trekking poles to allow PT to facilitate increased truncal rotation and arm swing with gait ~250 ft. Pt continues to require vc for foot clearance and demonstrates improved rotation with gait. Pt requires distant supervision for safety for reactive dynamic balance while bouncing and passing basketball. Pt able to safely demonstrate squat technique to obtain ball. Pt particpated in following outcome measure assessments in preparation for discharge and demonstrates clinically significant difference in timed up and go, five time sit to stand assessment and 10MWT, and Berg balance assessment. Pt ambulated to room and left seated edge of bed with all needs in reach and alarm on.   Timed Up and Go Assessment: 13. 32 seconds (significant difference compared to previous assessment of 37.64 s)  Five times Sit to Stand Test (FTSS) Method: Use a straight back chair with a solid seat that is 16-18" high. Ask participant to sit on the chair with arms folded across their chest.   Instructions: "Stand up and sit down as quickly as possible 5 times, keeping your arms folded across your chest."   Measurement: Stop timing  when the participant stands the 5th time.  TIME: 13.32 (in seconds) 2nd assessment demonstrates clinically significant difference Initial assessment on eval: 16.60 s   Times > 13.6 seconds is associated with increased disability and morbidity (Guralnik, 2000) Times > 15 seconds is predictive of recurrent falls in healthy individuals aged 47 and older (Buatois, et al., 2008) Normal performance values in community dwelling individuals aged 39 and older (Bohannon, 2006): 60-69 years: 11.4 seconds 70-79 years: 12.6 seconds 80-89 years: 14.8 seconds  MCID: ? 2.3 seconds for Vestibular Disorders (Meretta, 2006)  10 Meter Walk Test: Patient instructed to walk 10 meters (32.8 ft) as quickly and as safely as possible at their normal speed x2 and at a fast speed x2. Time measured from 2 meter mark to 8 meter mark to accommodate ramp-up and ramp-down.   Initial Score: 0.5 m/s  Discharge Score: 0.81 m/s   Patient demonstrates clinical significant difference in walking speed.   Cut off scores: <0.4 m/s = household Ambulator, 0.4-0.8 m/s = limited community Ambulator, >0.8 m/s = community Ambulator, >1.2 m/s = crossing a street, <1.0 = increased fall risk MCID 0.05 m/s (small), 0.13 m/s (moderate), 0.06 m/s (significant)  (ANPTA Core Set of Outcome Measures for Adults with Neurologic Conditions, 2018)   Standardized Balance Assessment Standardized Balance Assessment: Berg Balance Test Berg Balance Test Sit to Stand: Able to stand without using hands and stabilize independently Standing Unsupported: Able to stand safely 2 minutes Sitting with Back Unsupported but Feet Supported on Floor or Stool: Able to sit safely and securely 2 minutes Stand to Sit: Sits safely with minimal use of hands Transfers: Able  to transfer safely, minor use of hands Standing Unsupported with Eyes Closed: Able to stand 10 seconds safely Standing Ubsupported with Feet Together: Able to place feet together independently  and stand 1 minute safely From Standing, Reach Forward with Outstretched Arm: Can reach confidently >25 cm (10") From Standing Position, Pick up Object from Floor: Able to pick up shoe safely and easily From Standing Position, Turn to Look Behind Over each Shoulder: Looks behind from both sides and weight shifts well Turn 360 Degrees: Able to turn 360 degrees safely one side only in 4 seconds or less Standing Unsupported, Alternately Place Feet on Step/Stool: Able to stand independently and safely and complete 8 steps in 20 seconds Standing Unsupported, One Foot in Front: Loses balance while stepping or standing Standing on One Leg: Unable to try or needs assist to prevent fall Total Score: 47  Therapy/Group: Individual Therapy  Deborah Neal Deborah Neal PT, DPT  05/02/2022, 7:42 AM

## 2022-05-03 ENCOUNTER — Inpatient Hospital Stay (HOSPITAL_COMMUNITY): Payer: Medicare PPO

## 2022-05-03 LAB — CBC
HCT: 29.4 % — ABNORMAL LOW (ref 36.0–46.0)
Hemoglobin: 8.9 g/dL — ABNORMAL LOW (ref 12.0–15.0)
MCH: 23.2 pg — ABNORMAL LOW (ref 26.0–34.0)
MCHC: 30.3 g/dL (ref 30.0–36.0)
MCV: 76.6 fL — ABNORMAL LOW (ref 80.0–100.0)
Platelets: 349 10*3/uL (ref 150–400)
RBC: 3.84 MIL/uL — ABNORMAL LOW (ref 3.87–5.11)
RDW: 17.1 % — ABNORMAL HIGH (ref 11.5–15.5)
WBC: 4.6 10*3/uL (ref 4.0–10.5)
nRBC: 0 % (ref 0.0–0.2)

## 2022-05-03 LAB — BASIC METABOLIC PANEL
Anion gap: 9 (ref 5–15)
BUN: 16 mg/dL (ref 8–23)
CO2: 27 mmol/L (ref 22–32)
Calcium: 8.8 mg/dL — ABNORMAL LOW (ref 8.9–10.3)
Chloride: 103 mmol/L (ref 98–111)
Creatinine, Ser: 0.81 mg/dL (ref 0.44–1.00)
GFR, Estimated: >60 mL/min (ref >60)
Glucose, Bld: 121 mg/dL — ABNORMAL HIGH (ref 70–99)
Potassium: 3.3 mmol/L — ABNORMAL LOW (ref 3.5–5.1)
Sodium: 139 mmol/L (ref 135–145)

## 2022-05-03 LAB — GLUCOSE, CAPILLARY
Glucose-Capillary: 117 mg/dL — ABNORMAL HIGH (ref 70–99)
Glucose-Capillary: 117 mg/dL — ABNORMAL HIGH (ref 70–99)
Glucose-Capillary: 77 mg/dL (ref 70–99)
Glucose-Capillary: 131 mg/dL — ABNORMAL HIGH (ref 70–99)
Glucose-Capillary: 149 mg/dL — ABNORMAL HIGH (ref 70–99)

## 2022-05-03 LAB — MAGNESIUM: Magnesium: 1.7 mg/dL (ref 1.7–2.4)

## 2022-05-03 MED ORDER — ACETAMINOPHEN 325 MG PO TABS: 325.0000 mg | ORAL_TABLET | ORAL | Status: AC | PRN

## 2022-05-03 MED ORDER — ARTIFICIAL TEARS OPHTHALMIC OINT: TOPICAL_OINTMENT | OPHTHALMIC | Status: DC | PRN

## 2022-05-03 MED ORDER — LEVOTHYROXINE SODIUM 75 MCG PO TABS
75.0000 ug | ORAL_TABLET | Freq: Every day | ORAL | 0 refills | Status: DC
  Filled 2022-05-03: qty 30, 30d supply, fill #0

## 2022-05-03 MED ORDER — AMLODIPINE BESYLATE 2.5 MG PO TABS
7.5000 mg | ORAL_TABLET | Freq: Every day | ORAL | 0 refills | Status: DC
  Filled 2022-05-03: qty 90, 30d supply, fill #0

## 2022-05-03 MED ORDER — INSULIN GLARGINE 100 UNIT/ML SOLOSTAR PEN
16.0000 [IU] | PEN_INJECTOR | Freq: Two times a day (BID) | SUBCUTANEOUS | 11 refills | Status: DC
  Filled 2022-05-03: qty 15, 47d supply, fill #0

## 2022-05-03 MED ORDER — APIXABAN 5 MG PO TABS
5.0000 mg | ORAL_TABLET | Freq: Two times a day (BID) | ORAL | 0 refills | Status: DC
  Filled 2022-05-03: qty 60, 30d supply, fill #0

## 2022-05-03 MED ORDER — ENSURE ENLIVE PO LIQD: 237.0000 mL | Freq: Three times a day (TID) | ORAL | 12 refills | Status: DC

## 2022-05-03 MED ORDER — PROSOURCE PLUS PO LIQD: 30.0000 mL | Freq: Two times a day (BID) | ORAL | Status: DC

## 2022-05-03 MED ORDER — MAGNESIUM OXIDE 400 MG PO TABS
400.0000 mg | ORAL_TABLET | Freq: Every day | ORAL | 0 refills | Status: DC
  Filled 2022-05-03: qty 30, 30d supply, fill #0

## 2022-05-03 MED ORDER — POTASSIUM CHLORIDE CRYS ER 20 MEQ PO TBCR
20.0000 meq | EXTENDED_RELEASE_TABLET | Freq: Every day | ORAL | Status: DC
  Administered 2022-05-04: 20 meq via ORAL
  Filled 2022-05-03: qty 1

## 2022-05-03 MED ORDER — POTASSIUM CHLORIDE CRYS ER 20 MEQ PO TBCR
20.0000 meq | EXTENDED_RELEASE_TABLET | Freq: Every day | ORAL | 0 refills | Status: DC
  Filled 2022-05-03: qty 30, 30d supply, fill #0

## 2022-05-03 MED ORDER — FUROSEMIDE 20 MG PO TABS
20.0000 mg | ORAL_TABLET | Freq: Every day | ORAL | 0 refills | Status: DC
  Filled 2022-05-03: qty 30, 30d supply, fill #0

## 2022-05-03 MED ORDER — LOSARTAN POTASSIUM 25 MG PO TABS
25.0000 mg | ORAL_TABLET | Freq: Every day | ORAL | 0 refills | Status: DC
  Filled 2022-05-03: qty 30, 30d supply, fill #0

## 2022-05-03 MED ORDER — ADULT MULTIVITAMIN W/MINERALS CH: 1.0000 | ORAL_TABLET | Freq: Every day | ORAL | Status: AC

## 2022-05-03 MED ORDER — BLOOD GLUCOSE METER KIT
PACK | 0 refills | Status: AC
  Filled 2022-05-03: qty 1, 30d supply, fill #0

## 2022-05-03 MED ORDER — ROSUVASTATIN CALCIUM 20 MG PO TABS
20.0000 mg | ORAL_TABLET | Freq: Every day | ORAL | 0 refills | Status: DC
  Filled 2022-05-03: qty 30, 30d supply, fill #0

## 2022-05-03 MED ORDER — POTASSIUM CHLORIDE CRYS ER 20 MEQ PO TBCR
40.0000 meq | EXTENDED_RELEASE_TABLET | Freq: Once | ORAL | Status: AC
  Administered 2022-05-03: 40 meq via ORAL
  Filled 2022-05-03: qty 2

## 2022-05-03 MED ORDER — INSULIN ASPART 100 UNIT/ML FLEXPEN
8.0000 [IU] | PEN_INJECTOR | Freq: Three times a day (TID) | SUBCUTANEOUS | 0 refills | Status: DC
  Filled 2022-05-03: qty 15, fill #0

## 2022-05-03 MED ORDER — LIDOCAINE 5 % EX PTCH: 3.0000 | MEDICATED_PATCH | CUTANEOUS | 0 refills | Status: DC

## 2022-05-03 NOTE — Progress Notes (Signed)
Inpatient Rehabilitation Care Coordinator Discharge Note   Patient Details  Name: Deborah Neal MRN: [id number] Date of Birth: 1950/12/08   Discharge location: [place] 24/7 SUPERVISION  Length of Stay: 9 DAYS  Discharge activity level: SUPERVISION LEVEL  Home/community participation: ACTIVE  Patient response YV:GCYOYO Literacy - How often do you need to have someone help you when you read instructions, pamphlets, or other written material from your doctor or pharmacy?: Never  Patient response OJ:ZBFMZU Isolation - How often do you feel lonely or isolated from those around you?: Rarely  Services provided included: MD, RD, PT, OT, SLP, RN, CM, TR, Pharmacy, SW  Financial Services:  Charity fundraiser Utilized: [place] offered to/list presented to: PT AND DAUGHTER  Follow-up services arranged:  Home Health, DME, Patient/Family has no preference for HH/DME agencies [place]: [place]    DME : [facility]SP[facility] AND 3 IN 1 HAS TTB AND ROLLING WALKER    Patient response to transportation need: Is the patient able to respond to transportation needs?: Yes In the past 12 months, has lack of transportation kept you from medical appointments or from getting medications?: No In the past 12 months, has lack of transportation kept you from meetings, work, or from getting things needed for daily living?: No    Comments (or additional information):PT DID WELL AND REACHED HER GOALS QUICKLY DAUGHTER WAS HERE DAILY AND PARTICIPATED IN THERAPIES WITH PT. [place] 24/7 SUPERVISION  Patient/Family verbalized understanding of follow-up arrangements:  Yes  Individual responsible for coordination of the follow-up plan: [facility] [phone]  Confirmed correct DME delivered: Deborah Neal 05/03/2022    Deborah Neal, Deborah Neal

## 2022-05-03 NOTE — Progress Notes (Signed)
Physical Therapy Discharge Summary  Patient Details  Name: Deborah Neal MRN: [id number] Date of Birth: 12/26/1950  Date of Discharge from PT service:May 03, 2022  Today's Date: 05/03/2022 PT Individual Time: 1100-1158 PT Individual Time Calculation (min): 58 min    Patient has met 7 of 7 long term goals due to improved activity tolerance, improved balance, improved postural control, increased strength, decreased pain, ability to compensate for deficits, improved awareness, and improved coordination.  Patient to discharge at an ambulatory level Modified Independent.   Reasons goals not met: N/A  Recommendation:  Patient will benefit from ongoing skilled PT services in home health setting to continue to advance safe functional mobility, address ongoing impairments in balance, coordination, activity tolerance, and minimize fall risk.  Equipment: RW, manual w/c   Reasons for discharge: treatment goals met and discharge from hospital  Patient/family agrees with progress made and goals achieved: Yes  Treatment Session:  Pt agreeable to PT session and declines pain in session. PT assessed pain interference, sensation, coordination, strength, balance and high level gait activities in preparation for discharge. Pt Mod I with all mobility in session and ambulated >600 ft in session with no AD. Pt performed six minute walk test and able to ambulate ~580 ft in 6 minutes demonstrating improvement from initial assessment of gait distance of ~155 ft. Pt performed FGA assessment and demonstrated clinically significant increase in score from 15 to 22. Pt transitioned to dribbling and bouncing ball with gait ~100 ft and is able to pick up and retrieve object from the floor. Pt returned to room and left seated in recliner at bedside with all needs in reach. Pt deemed Mod I in hospital room and nursing notified.    PT Discharge  Pain Interference Pain Interference Pain Effect on Sleep: 1. Rarely or  not at all Pain Interference with Therapy Activities: 0. Does not apply - I have not received rehabilitationtherapy in the past 5 days Pain Interference with Day-to-Day Activities: 1. Rarely or not at all Cognition Overall Cognitive Status: Within Functional Limits for tasks assessed Arousal/Alertness: Awake/alert Orientation Level: Oriented X4 Attention: Selective Memory: Impaired Memory Impairment: Decreased recall of new information;Decreased short term memory Awareness: Appears intact Awareness Impairment: Emergent impairment Problem Solving: Appears intact Problem Solving Impairment: Functional complex Safety/Judgment: Appears intact Sensation Sensation Light Touch: Appears Intact Proprioception: Appears Intact Coordination Gross Motor Movements are Fluid and Coordinated: Yes Fine Motor Movements are Fluid and Coordinated: Yes Finger Nose Finger Test: L UE delyaed, R UE WFL Heel Shin Test: [facility] Motor  Motor Motor: Within Functional Limits Motor - Skilled Clinical Observations: grossly coordinated and demonstrates strength/balance improvements  Mobility Bed Mobility Bed Mobility: Rolling Right;Rolling Left;Sit to Supine;Supine to Sit Rolling Right: Independent Rolling Left: Independent Right Sidelying to Sit: Independent Supine to Sit: Independent Sit to Supine: Independent Transfers Transfers: Sit to Stand;Stand to Sit Sit to Stand: Independent with assistive device Stand to Sit: Independent with assistive device Stand Pivot Transfers: Independent with assistive device Transfer (Assistive device): None Locomotion  Gait Ambulation: Yes Gait Assistance: Independent with assistive device Gait Distance (Feet): 300 Feet (feet) Assistive device: None Gait Assistance Details: Verbal cues for precautions/safety;Verbal cues for gait pattern Gait Gait: Yes Gait Pattern: Within Functional Limits Gait Pattern: Narrow base of support Gait velocity: decreased Stairs /  Additional Locomotion Stairs: Yes Stairs Assistance: Supervision/Verbal cueing Stair Management Technique: One rail Right Number of Stairs: 12 Height of Stairs: 6 Ramp: Independent with assistive device Curb: Supervision/Verbal cueing Wheelchair Mobility  Wheelchair Mobility: No  Trunk/Postural Assessment  Cervical Assessment Cervical Assessment: Exceptions to [facility] (forward head) Thoracic Assessment Thoracic Assessment: Exceptions to [facility] (rounded shoulders) Lumbar Assessment Lumbar Assessment: Exceptions to [facility] (posterior pelvic tilt) Postural Control Postural Control: Within Functional Limits  Balance Balance Balance Assessed: Yes Standardized Balance Assessment Standardized Balance Assessment: Berg Balance Test;Functional Gait Assessment Berg Balance Test Sit to Stand: Able to stand without using hands and stabilize independently Standing Unsupported: Able to stand safely 2 minutes Sitting with Back Unsupported but Feet Supported on Floor or Stool: Able to sit safely and securely 2 minutes Stand to Sit: Sits safely with minimal use of hands Transfers: Able to transfer safely, minor use of hands Standing Unsupported with Eyes Closed: Able to stand 10 seconds safely Standing Ubsupported with Feet Together: Able to place feet together independently and stand 1 minute safely From Standing, Reach Forward with Outstretched Arm: Can reach confidently >25 cm (10") From Standing Position, Pick up Object from Floor: Able to pick up shoe safely and easily From Standing Position, Turn to Look Behind Over each Shoulder: Looks behind from both sides and weight shifts well Turn 360 Degrees: Able to turn 360 degrees safely one side only in 4 seconds or less Standing Unsupported, Alternately Place Feet on Step/Stool: Able to stand independently and safely and complete 8 steps in 20 seconds Standing Unsupported, One Foot in Front: Loses balance while stepping or standing Standing on One Leg:  Unable to try or needs assist to prevent fall Total Score: 47 Timed Up and Go Test TUG: Normal TUG Normal TUG (seconds): 13.32 Static Sitting Balance Static Sitting - Balance Support: Feet supported Static Sitting - Level of Assistance: 7: Independent Dynamic Sitting Balance Dynamic Sitting - Balance Support: During functional activity;Feet supported Dynamic Sitting - Level of Assistance: 7: Independent Dynamic Sitting - Balance Activities: Lateral lean/weight shifting;Forward lean/weight shifting;Reaching for objects Static Standing Balance Static Standing - Balance Support: During functional activity;No upper extremity supported Static Standing - Level of Assistance: 6: Modified independent (Device/Increase time) Dynamic Standing Balance Dynamic Standing - Balance Support: No upper extremity supported;During functional activity Dynamic Standing - Level of Assistance: 6: Modified independent (Device/Increase time) Dynamic Standing - Balance Activities: Lateral lean/weight shifting;Forward lean/weight shifting;Reaching for objects;[place];Reaching across midline Functional Gait  Assessment Gait Level Surface: Walks 20 ft, slow speed, abnormal gait pattern, evidence for imbalance or deviates 10-15 in outside of the 12 in walkway width. Requires more than 7 sec to ambulate 20 ft. Change in Gait Speed: Able to smoothly change walking speed without loss of balance or gait deviation. Deviate no more than 6 in outside of the 12 in walkway width. Gait with Horizontal Head Turns: Performs head turns smoothly with no change in gait. Deviates no more than 6 in outside 12 in walkway width Gait with Vertical Head Turns: Performs head turns with no change in gait. Deviates no more than 6 in outside 12 in walkway width. Gait and Pivot Turn: Pivot turns safely within 3 sec and stops quickly with no loss of balance. Step Over Obstacle: Is able to step over one shoe box (4.5 in total height) without  changing gait speed. No evidence of imbalance. Gait with Narrow Base of Support: Ambulates 7-9 steps. Gait with Eyes Closed: Walks 20 ft, slow speed, abnormal gait pattern, evidence for imbalance, deviates 10-15 in outside 12 in walkway width. Requires more than 9 sec to ambulate 20 ft. Ambulating Backwards: Walks 20 ft, uses assistive device, slower speed, mild gait  deviations, deviates 6-10 in outside 12 in walkway width. Steps: Alternating feet, must use rail. Total Score: 22 Extremity Assessment  RLE Assessment RLE Assessment: Within Functional Limits General Strength Comments: Grossly 5/5 LLE Assessment LLE Assessment: Within Functional Limits General Strength Comments: Grossly 5/5   Verl Dicker Verl Dicker PT, DPT  05/03/2022, 11:40 AM

## 2022-05-03 NOTE — Progress Notes (Signed)
PROGRESS NOTE   Subjective/Complaints: Continues ot refuse CPAP since "didn't wear one at home".   Michela Pitcher has taken Norco/stronger pain medicine for chest wall/back pain- has been helpful-  Ate 100% tray.  Denies SOB.   K+ 3.3   ROS:  Pt denies SOB, abd pain, CP, N/V/C/D, and vision changes   Objective:   DG CHEST PORT 1 VIEW  Result Date: 05/03/2022 CLINICAL DATA:  761950 Pulmonary vascular congestion [id number], pneumonia EXAM: PORTABLE CHEST 1 VIEW COMPARISON:  Radiograph 04/29/2022 FINDINGS: Unchanged cardiomediastinal silhouette. There is persistent bilateral airspace disease with slight improvement in the right apex in comparison to prior. Unchanged trace pleural effusions, left greater than right. No pneumothorax. Bones are unchanged. IMPRESSION: Persistent bilateral airspace disease with slight improvement in the right apex in comparison to prior. Unchanged trace pleural effusions, left greater than right. Electronically Signed   By: Maurine Simmering M.D.   On: 05/03/2022 09:01   Recent Labs    05/03/22 0644  WBC 4.6  HGB 8.9*  HCT 29.4*  PLT 349    Recent Labs    05/03/22 0644  NA 139  K 3.3*  CL 103  CO2 27  GLUCOSE 121*  BUN 16  CREATININE 0.81  CALCIUM 8.8*     Intake/Output Summary (Last 24 hours) at 05/03/2022 1231 Last data filed at 05/03/2022 9326 Gross per 24 hour  Intake 240 ml  Output --  Net 240 ml      Pressure Injury 04/09/22 Sacrum Medial Stage 2 -  Partial thickness loss of dermis presenting as a shallow open injury with a red, pink wound bed without slough. open Blister to sacrum (Active)  04/09/22 1700  Location: Sacrum  Location Orientation: Medial  Staging: Stage 2 -  Partial thickness loss of dermis presenting as a shallow open injury with a red, pink wound bed without slough.  Wound Description (Comments): open Blister to sacrum  Present on Admission: Yes    Physical Exam: Vital  Signs Blood pressure 103/60, pulse 91, temperature 98.5 F (36.9 C), resp. rate 18, height '5\' 3"'$  (1.6 m), weight 74.2 kg, SpO2 98 %.    General: awake, alert, appropriate, sitting on EOB talking to daughter on phone; ate 100% tray; NAD HENT: conjugate gaze; oropharynx moist CV: regular rate; no JVD Pulmonary: CTA B/L; no W/R/R- good air movement-sounds great GI: soft, NT, ND, (+)BS Psychiatric: appropriate- trace flat Neurological: alert- trace slowed interaction  Extremities;  No LE edema at all B/L- no change Musculoskeletal:     Cervical back: Neck supple. No tenderness.     Comments: 5/5 in B/L UE/arms- biceps, triceps, WE, grip and FA 5-/5 in B/L LE's- HF, KE, KF, DF and PF 5/5 B/L  Skin:    General: Skin is warm and dry.     Comments: R forearm IV- looks OK Neck irritation as above No significant LE edema- maybe trace in feet/ankles B/L  Neurological:     Mental Status: She is alert and oriented to person, place, and time.     Comments: Intact to light touch in all 4 extremities Ox3- able to answer 2-3 steps questions and follow 2 step commands  Assessment/Plan:  1. Functional deficits which require 3+ hours per day of interdisciplinary therapy in a comprehensive inpatient rehab setting. Physiatrist is providing close team supervision and 24 hour management of active medical problems listed below. Physiatrist and rehab team continue to assess barriers to discharge/monitor patient progress toward functional and medical goals  Care Tool:  Bathing    Body parts bathed by patient: Right arm, Left arm, Chest, Abdomen, Front perineal area, Buttocks, Right upper leg, Left upper leg, Right lower leg, Left lower leg, Face         Bathing assist Assist Level: Set up assist     Upper Body Dressing/Undressing Upper body dressing   What is the patient wearing?: Bra, Pull over shirt    Upper body assist Assist Level: Set up assist    Lower Body Dressing/Undressing Lower  body dressing      What is the patient wearing?: Underwear/pull up, Pants     Lower body assist Assist for lower body dressing: Set up assist     Toileting Toileting    Toileting assist Assist for toileting: Independent with assistive device     Transfers Chair/bed transfer  Transfers assist     Chair/bed transfer assist level: Minimal Assistance - Patient > 75%     Locomotion Ambulation   Ambulation assist      Assist level: Minimal Assistance - Patient > 75% Assistive device: Walker-rolling Max distance: 200 ft   Walk 10 feet activity   Assist     Assist level: Minimal Assistance - Patient > 75% Assistive device: Walker-rolling   Walk 50 feet activity   Assist    Assist level: Minimal Assistance - Patient > 75% Assistive device: Walker-rolling    Walk 150 feet activity   Assist    Assist level: Minimal Assistance - Patient > 75% Assistive device: Walker-rolling    Walk 10 feet on uneven surface  activity   Assist     Assist level: Moderate Assistance - Patient - 50 - 74% Assistive device: Aeronautical engineer Is the patient using a wheelchair?: Yes (transport for time management only) Type of Wheelchair: Manual           Wheelchair 50 feet with 2 turns activity    Assist        Assist Level: Dependent - Patient 0%   Wheelchair 150 feet activity     Assist      Assist Level: Dependent - Patient 0%   Blood pressure 103/60, pulse 91, temperature 98.5 F (36.9 C), resp. rate 18, height '5\' 3"'$  (1.6 m), weight 74.2 kg, SpO2 98 %.  Medical Problem List and Plan: 1. Functional deficits secondary to debility from cardiac arrest with CPR >2 minutes-              -patient may  shower             -ELOS/Goals: 12-16 days- mod I to supervision D/c date- 05/04/22  1/8- d/c tomorrow- CXR today looks good- won't change Lasix dose  Con't CIR- PT, OT and SLP 2.  Antithrombotics: -DVT/anticoagulation:   Pharmaceutical: Eliquis   1/4- R arm pain from prior IV- no infiltrate- since on Eliquis, no reason to check Doppler of RUE, I think.              -antiplatelet therapy: none   3. Pain Management: continue Tylenol as needed             Sore ribs from  CPR- might benefit from Lidoderm patches?  1/2- per daughter, is under-reportor- will add Lidoderm patches 3 patches 8pm to 8am- monitor closely- might need stronger meds.   1/3- still having some chest pain- chest wall- however lidoderm was helpful overnight  1/4- therapy still impacted by chest/back pain- "mildly' per pt- will consider Norco  as needed due to increased risk of Seizures/seizure like activity initially.   1/5- will order Norco prn for severe pain.   1/8- asking if will go home with current meds- explained will get meds/7 days of Norco prn 4. Mood/Behavior/Sleep: LCSW to evaluate and provide emotional support             -antipsychotic agents: n/a   5. Neuropsych/cognition: This patient is capable of making decisions on her own behalf. Pt has mild anoxia per SLP- SLP seeing.   6. Skin/Wound Care: Routine skin care checks   7. Fluids/Electrolytes/Nutrition: routine Is and Os -carb modified diet -K phos 500 mg TID -Lasix continues for vascular congestion - edema almost resolved in legs- will follow IM recs on when to stop Lasix- maybe earlier if improves. 1/1- No LE edema, however has moderate vascular congestion on CXR- will wait to reduce Lasix.  1/3- will check CXR in AM 1/4- Looks much better- will reduce Lasix to 20 mg daily 1/5- will order f/u CXR for Monday AM  1/8- CXR shows no fluid and no LE edema-  8: Cardiac arrest, suspected PE s/p tenecteplase             -follow-up with Dr. Florene Glen   9: Seizure-like activity: Keppra discontinued   1/8- no Tramadol for pain 10: AKI: creatinine at baseline; follow-up BMP   1/1- Cr very slightly elevated at 1.02 and BUN 14- con't to monitor  1/4- Cr stable at 1.02- and BUN  normal- con't regimen  1/8- Cr down to 0.81 and BUN 16 11: Respiratory failure/pneumonia: completed antibiotics             -pulmonary toilet; albuterol nebs as needed             -continue Duoneb BID   1/4- Vascualr congestion has improved- small pleural effusion 12: Bilateral pleural effusions: repeat chest x-ray on 12/30>>improvement (see report above)             -diuresis continues with Lasix 40 mg daily   1/1- wait to reduce Lasix due to pulm vascular congestion  1/3- will recheck CXR tomorrow  1/4- Small pleural effusions- but vascular congestion is almost gone- will reduce Lasix to 20 mg daily and recheck CXR early next week. Monitor edema  1/8- looks great- will have PCP assess if to continue Lasix 20 mg daily- will start 20 mEq daily KCL since on lasix- and replete 40 mEq x1 today for K+ of 3.3 13: Anemia: Hemoglobin with downward trend: 7.6; recheck CBC    1/1- Hb up to 8.3-  14: Rib fractures: pain control   1/4- using lidoderm patches and tylenol 15: Hypertension: monitor TID and prn             continue  Lasix 40 mg daily             -continue Norvasc 7.5 mg daily   1/3- well controlled- con't regimen 16: DM/DKA: Ca1c >15.5 BGs q AC and q HS; (home meds: Jardiance 25 mg daily, Toujeo 100 units daily, Humulin R U500 60 units TID with meals)              -  SSI             -Novolog 8 units with meals             -Semglee 18 units BID   1/1- CBGs 91 to 181- MUCH better- con't regimen for now  1/2- CBGs OK except 1 value of 331 last night- will check with pt if was snacking?  1/3- CBGs 64-149- will back off Semglee to 16 units BID- educated on getting Dexcom, etc outpt  1/4- CBGs 118-228- since decreased her Semglee due to multiple Cbgs of 60s- will maintain current regimen for now.   1/8- CBGs 117-173- MUCH better will send home on current regimen- I don't see how to reduce her with meals coverage since pretty low in AM- will have pt's PCP address- but pt was on Insulin at home  prior- also speak to PCP about Dexcom, etc for pt's fear of getting CBG's checked.   17: Left upper lobe atelectasis: Concern for chronic obstruction of left upper lobe bronchus             -Recommend follow-up CT scan in 3 months versus other imaging   18: Hypokalemia: replete; follow-up BMP   1/1- K+ 3.7 19: Hyperlipidemia: Crestor   20: Hypomagnesemia: continue Mag-ox 400 mg daily; recheck mag 1/1 1/2- will increase Mg Ox to 400 mg BID 1/3- will recheck Mg Thursday 1/4- Mg 1.8- con't regimen 1/7: recheck ordered for tomorrow 21: Elevated TSH: T3, T4 pending   1/1- T4 is normal at 0.81- on lower side, but still in normal limits; however TSH is 9.8- will start 75 mcg Synthroid since has hx of TSH being elevated as well as borderline high in past as well- pt also c/o being cold all the time and dry skin/hair falling out.  22. Urine incontinence  1/1- will get timed voiding- due to incontinence- likely due to using Purewick so long, but will monitor  1/2- only overnight- likely due to [place]- will try timed voiding.   Continue timed voiding 23. Insomnia  1/2- Melatonin didn't work- will try Trazodone 25 mg QHS prn- and daughter will encourage her to take.   1/3- pt took 25 mg Trazodone- slept much better  1/4- sleeping great per daughter  1/7: continue trazodone 24. Mild anoxia  1/3- due to cardiac arrest/need for CPR- ordered SLP eval and they have picked her up- will need reminders due to [facility] deficits.   I spent a total of 36   minutes on total care today- >50% coordination of care- due to d/w PA about CXR and read for Lasix- repletion of K+ and Cbgs/Dm control.      LOS: 8 days A FACE TO FACE EVALUATION WAS PERFORMED  Jacie Tristan 05/03/2022, 12:31 PM

## 2022-05-03 NOTE — Progress Notes (Signed)
Pt refused cpap

## 2022-05-03 NOTE — Plan of Care (Signed)
  Problem: RH Problem Solving Goal: LTG Patient will demonstrate problem solving for (SLP) Description: LTG:  Patient will demonstrate problem solving for basic/complex daily situations with cues  (SLP) Outcome: Not Met (add Reason) Note: Patient requires intermittent Min verbal cues for complex problem solving.    Problem: RH Memory Goal: LTG Patient will use memory compensatory aids to (SLP) Description: LTG:  Patient will use memory compensatory aids to recall biographical/new, daily complex information with cues (SLP) Outcome: Completed/Met   Problem: RH Attention Goal: LTG Patient will demonstrate this level of attention during functional activites (SLP) Description: LTG:  Patient will will demonstrate this level of attention during functional activites (SLP) Outcome: Completed/Met   Problem: RH Awareness Goal: LTG: Patient will demonstrate awareness during functional activites type of (SLP) Description: LTG: Patient will demonstrate awareness during functional activites type of (SLP) Outcome: Completed/Met

## 2022-05-03 NOTE — Progress Notes (Incomplete)
Inpatient Rehabilitation Discharge Medication Review by a Pharmacist  A complete drug regimen review was completed for this patient to identify any potential clinically significant medication issues.  High Risk Drug Classes Is patient taking? Indication by Medication  Antipsychotic {Receiving?:26196}   Anticoagulant {Receiving?:26196} Apixaban - PE treatment  Antibiotic {Receiving?:26196}   Opioid {Receiving?:26196}   Antiplatelet {Receiving?:26196}   Hypoglycemics/insulin {Yes or No?:26198} Insulin glargine, insulin aspart - DM  Vasoactive Medication {Receiving?:26196} Amlodipine, losartan - HTN  Chemotherapy {Receiving Chemo?:26197}   Other {Yes or No?:26198} Albuterol - PRN SOB Furosemide - edema Mg oxide, potassium chloride - supplement Pantoprazole - GERD ppx Rosuvastatin - HLD     Type of Medication Issue Identified Description of Issue Recommendation(s)  Drug Interaction(s) (clinically significant)     Duplicate Therapy     Allergy     No Medication Administration End Date     Incorrect Dose     Additional Drug Therapy Needed     Significant med changes from prior encounter (inform family/care partners about these prior to discharge). Not resumed at discharge: Insulin U-500, empagliflozin, aspirin,  tizanidine, Toujeo 300u/mL   Losartan decreased from '100mg'$  > '25mg'$  Communicate medication changes with patient/family at discharge  Other       Clinically significant medication issues were identified that warrant physician communication and completion of prescribed/recommended actions by midnight of the next day:  {Yes or No?:26198}  Name of provider notified for urgent issues identified: ***   Provider Method of Notification: ***    Pharmacist comments:  New apixaban for PE, education completed w/ patient 12/29, copay $10/month   Time spent performing this drug regimen review (minutes): 20   Thank you for allowing pharmacy to be a part of this patient's  care.  ***

## 2022-05-03 NOTE — Progress Notes (Signed)
Speech Language Pathology Discharge Summary  Patient Details  Name: Deborah Neal MRN: [id number] Date of Birth: 05-27-1950  Date of Discharge from [facility] service:May 03, 2022  Today's Date: 05/03/2022 SLP Individual Time: 0930-1015 SLP Individual Time Calculation (min): 45 min   Skilled Therapeutic Interventions:  Skilled treatment session focused on cognitive goals. Upon arrival, patient appeared bright and was awake while sitting in the recliner. SLP facilitated session by administering the Cognistat. Patient scored WFL on all subtests with the exception of mild impairments in calculations and recall which the patient reports is baseline. Despite results of the assessment, patient able to recall pertinent and functional information with overall supervision level verbal cues. SLP provided education to the patient regarding memory compensatory strategies and how to live a "healthy brain lifestyle." Patient verbalized understanding of all information and handouts were given for reinforcement. Patient left upright in recliner with all needs within reach.    Patient has met 3 of 4 long term goals.  Patient to discharge at overall Supervision;Min level.   Reasons goals not met: Patient continues to require intermittent Min verbal cues for complex problem solving.   Clinical Impression/Discharge Summary: Patient has made functional gains and has met 3 of 4 STGs this reporting period. Currently, patient demonstrates improved cognitive functioning and requires overall supervision-Min A verbal cues to complete functional and familiar tasks safely in regards to problem solving, selective attention, emergent awareness, and recall of functional information. Patient and family education is complete and patient will discharge home with 24 hour supervision from family. Patient would benefit from f/u SLP services to maximize her cognitive functioning and overall functional independence in order to reduce caregiver  burden.   Care Partner:  Caregiver Able to Provide Assistance: Yes  Type of Caregiver Assistance: Physical;Cognitive  Recommendation:  24 hour supervision/assistance;Outpatient SLP  Rationale for SLP Follow Up: Reduce caregiver burden;Maximize cognitive function and independence   Equipment: N/A   Reasons for discharge: Discharged from hospital;Treatment goals met   Patient/Family Agrees with Progress Made and Goals Achieved: Yes    Brigit Doke, [place] 05/03/2022, 6:45 AM

## 2022-05-03 NOTE — Plan of Care (Signed)
  Problem: RH Bathing Goal: LTG Patient will bathe all body parts with assist levels (OT) Description: LTG: Patient will bathe all body parts with assist levels (OT) Outcome: Completed/Met   Problem: RH Dressing Goal: LTG Patient will perform lower body dressing w/assist (OT) Description: LTG: Patient will perform lower body dressing with assist, with/without cues in positioning using equipment (OT) Outcome: Completed/Met   Problem: RH Toileting Goal: LTG Patient will perform toileting task (3/3 steps) with assistance level (OT) Description: LTG: Patient will perform toileting task (3/3 steps) with assistance level (OT)  Outcome: Completed/Met   Problem: RH Simple Meal Prep Goal: LTG Patient will perform simple meal prep w/assist (OT) Description: LTG: Patient will perform simple meal prep with assistance, with/without cues (OT). Outcome: Completed/Met   Problem: RH Laundry Goal: LTG Patient will perform laundry w/assist, cues (OT) Description: LTG: Patient will perform laundry with assistance, with/without cues (OT). Outcome: Completed/Met   Problem: RH Light Housekeeping Goal: LTG Patient will perform light housekeeping w/assist (OT) Description: LTG: Patient will perform light housekeeping with assistance, with/without cues (OT). Outcome: Completed/Met   Problem: RH Toilet Transfers Goal: LTG Patient will perform toilet transfers w/assist (OT) Description: LTG: Patient will perform toilet transfers with assist, with/without cues using equipment (OT) Outcome: Completed/Met   Problem: RH Tub/Shower Transfers Goal: LTG Patient will perform tub/shower transfers w/assist (OT) Description: LTG: Patient will perform tub/shower transfers with assist, with/without cues using equipment (OT) Outcome: Completed/Met

## 2022-05-04 ENCOUNTER — Other Ambulatory Visit (HOSPITAL_COMMUNITY): Payer: Self-pay

## 2022-05-04 DIAGNOSIS — R5381 Other malaise: Secondary | ICD-10-CM | POA: Diagnosis not present

## 2022-05-04 LAB — GLUCOSE, CAPILLARY
Glucose-Capillary: 90 mg/dL (ref 70–99)
Glucose-Capillary: 188 mg/dL — ABNORMAL HIGH (ref 70–99)

## 2022-05-04 MED ORDER — LIDOCAINE 5 % EX PTCH: 3.0000 | MEDICATED_PATCH | CUTANEOUS | 0 refills | Status: AC

## 2022-05-04 MED ORDER — ACCU-CHEK SOFTCLIX LANCETS MISC
0 refills | Status: AC
  Filled 2022-05-04: qty 100, 30d supply, fill #0
  Filled 2022-05-04: qty 100, 25d supply, fill #0

## 2022-05-04 MED ORDER — CAREFINE PEN NEEDLES 32G X 4 MM MISC
1.0000 | Freq: Four times a day (QID) | 0 refills | Status: DC
  Filled 2022-05-04: qty 100, 25d supply, fill #0

## 2022-05-04 MED ORDER — GLUCOSE BLOOD VI STRP
ORAL_STRIP | 0 refills | Status: DC
  Filled 2022-05-04: qty 100, 25d supply, fill #0
  Filled 2022-05-04: qty 100, 30d supply, fill #0

## 2022-05-04 MED ORDER — HYDROCODONE-ACETAMINOPHEN 5-325 MG PO TABS
1.0000 | ORAL_TABLET | ORAL | 0 refills | Status: AC | PRN
  Filled 2022-05-04: qty 20, 4d supply, fill #0

## 2022-05-04 NOTE — Progress Notes (Signed)
PROGRESS NOTE   Subjective/Complaints:  Pt reports been eating the same.  But according to nurse, held [place] last 2 doses due to CBGs of 77 and () also held long acting insulin this AM.    ROS:  Pt denies SOB, abd pain, CP, N/V/C/D, and vision changes   Objective:   DG CHEST PORT 1 VIEW  Result Date: 05/03/2022 CLINICAL DATA:  633354 Pulmonary vascular congestion 598141, pneumonia EXAM: PORTABLE CHEST 1 VIEW COMPARISON:  Radiograph 04/29/2022 FINDINGS: Unchanged cardiomediastinal silhouette. There is persistent bilateral airspace disease with slight improvement in the right apex in comparison to prior. Unchanged trace pleural effusions, left greater than right. No pneumothorax. Bones are unchanged. IMPRESSION: Persistent bilateral airspace disease with slight improvement in the right apex in comparison to prior. Unchanged trace pleural effusions, left greater than right. Electronically Signed   By: Maurine Simmering M.D.   On: 05/03/2022 09:01   Recent Labs    05/03/22 0644  WBC 4.6  HGB 8.9*  HCT 29.4*  PLT 349    Recent Labs    05/03/22 0644  NA 139  K 3.3*  CL 103  CO2 27  GLUCOSE 121*  BUN 16  CREATININE 0.81  CALCIUM 8.8*     Intake/Output Summary (Last 24 hours) at 05/04/2022 5625 Last data filed at 05/03/2022 1820 Gross per 24 hour  Intake 476 ml  Output --  Net 476 ml      Pressure Injury 04/09/22 Sacrum Medial Stage 2 -  Partial thickness loss of dermis presenting as a shallow open injury with a red, pink wound bed without slough. open Blister to sacrum (Active)  04/09/22 1700  Location: Sacrum  Location Orientation: Medial  Staging: Stage 2 -  Partial thickness loss of dermis presenting as a shallow open injury with a red, pink wound bed without slough.  Wound Description (Comments): open Blister to sacrum  Present on Admission: Yes    Physical Exam: Vital Signs Blood pressure 118/65, pulse 81,  temperature 98.9 F (37.2 C), temperature source Oral, resp. rate 16, height '5\' 3"'$  (1.6 m), weight 75.3 kg, SpO2 100 %.     General: awake, alert, appropriate, sitting up EOB; NAD HENT: conjugate gaze; oropharynx moist CV: regular rate; no JVD Pulmonary: CTA B/L; no W/R/R- good air movement GI: soft, NT, ND, (+)BS Psychiatric: appropriate Neurological: Ox3- still some STM deficits  Extremities;  No LE edema at all B/L- no change Musculoskeletal:     Cervical back: Neck supple. No tenderness.     Comments: 5/5 in B/L UE/arms- biceps, triceps, WE, grip and FA 5-/5 in B/L LE's- HF, KE, KF, DF and PF 5/5 B/L  Skin:    General: Skin is warm and dry.     Comments: R forearm IV- looks OK Neck irritation as above No significant LE edema- maybe trace in feet/ankles B/L  Neurological:     Mental Status: She is alert and oriented to person, place, and time.     Comments: Intact to light touch in all 4 extremities Ox3- able to answer 2-3 steps questions and follow 2 step commands  Assessment/Plan: 1. Functional deficits which require 3+ hours per day of  interdisciplinary therapy in a comprehensive inpatient rehab setting. Physiatrist is providing close team supervision and 24 hour management of active medical problems listed below. Physiatrist and rehab team continue to assess barriers to discharge/monitor patient progress toward functional and medical goals  Care Tool:  Bathing    Body parts bathed by patient: Right arm, Left arm, Chest, Abdomen, Front perineal area, Buttocks, Right upper leg, Left upper leg, Right lower leg, Left lower leg, Face         Bathing assist Assist Level: Set up assist     Upper Body Dressing/Undressing Upper body dressing   What is the patient wearing?: Bra, Pull over shirt    Upper body assist Assist Level: Set up assist    Lower Body Dressing/Undressing Lower body dressing      What is the patient wearing?: Underwear/pull up, Pants      Lower body assist Assist for lower body dressing: Set up assist     Toileting Toileting    Toileting assist Assist for toileting: Independent with assistive device     Transfers Chair/bed transfer  Transfers assist     Chair/bed transfer assist level: Independent with assistive device     Locomotion Ambulation   Ambulation assist      Assist level: Independent with assistive device Assistive device: Walker-rolling Max distance: 580 ft   Walk 10 feet activity   Assist     Assist level: Independent with assistive device Assistive device: No Device   Walk 50 feet activity   Assist    Assist level: Independent with assistive device Assistive device: No Device    Walk 150 feet activity   Assist    Assist level: Independent with assistive device Assistive device: No Device    Walk 10 feet on uneven surface  activity   Assist     Assist level: Independent with assistive device Assistive device: Walker-rolling   Wheelchair     Assist Is the patient using a wheelchair?: Yes Type of Wheelchair: Manual    Wheelchair assist level: Supervision/Verbal cueing      Wheelchair 50 feet with 2 turns activity    Assist        Assist Level: Supervision/Verbal cueing   Wheelchair 150 feet activity     Assist      Assist Level: Supervision/Verbal cueing   Blood pressure 118/65, pulse 81, temperature 98.9 F (37.2 C), temperature source Oral, resp. rate 16, height '5\' 3"'$  (1.6 m), weight 75.3 kg, SpO2 100 %.  Medical Problem List and Plan: 1. Functional deficits secondary to debility from cardiac arrest with CPR >2 minutes-              -patient may  shower             -ELOS/Goals: 12-16 days- mod I to supervision D/c date- 05/04/22  1/8- d/c tomorrow- CXR today looks good- won't change Lasix dose  1/9- d/c today- has PCP appt tomorrow- will need labs then- also will stop her Novolog and continue Semglee- since she hates BG checks  and admits won't "do as much at home". Won't need scheduled f/u with me- only to see as needed.  2.  Antithrombotics: -DVT/anticoagulation:  Pharmaceutical: Eliquis   1/4- R arm pain from prior IV- no infiltrate- since on Eliquis, no reason to check Doppler of RUE, I think.              -antiplatelet therapy: none   3. Pain Management: continue Tylenol as needed  Sore ribs from CPR- might benefit from Lidoderm patches?  1/2- per daughter, is under-reportor- will add Lidoderm patches 3 patches 8pm to 8am- monitor closely- might need stronger meds.   1/3- still having some chest pain- chest wall- however lidoderm was helpful overnight  1/4- therapy still impacted by chest/back pain- "mildly' per pt- will consider Norco  as needed due to increased risk of Seizures/seizure like activity initially.   1/5- will order Norco prn for severe pain.   1/8- asking if will go home with current meds- explained will get meds/7 days of Norco prn  1/9- will send home on 5 days Norco prn 4. Mood/Behavior/Sleep: LCSW to evaluate and provide emotional support             -antipsychotic agents: n/a   5. Neuropsych/cognition: This patient is capable of making decisions on her own behalf. Pt has mild anoxia per SLP- SLP seeing.   6. Skin/Wound Care: Routine skin care checks   7. Fluids/Electrolytes/Nutrition: routine Is and Os -carb modified diet -K phos 500 mg TID -Lasix continues for vascular congestion - edema almost resolved in legs- will follow IM recs on when to stop Lasix- maybe earlier if improves. 1/1- No LE edema, however has moderate vascular congestion on CXR- will wait to reduce Lasix.  1/3- will check CXR in AM 1/4- Looks much better- will reduce Lasix to 20 mg daily 1/5- will order f/u CXR for Monday AM  1/8- CXR shows no fluid and no LE edema-  1/9- con't Lasix 20 mg daily- PCP to address if needs long term 8: Cardiac arrest, suspected PE s/p tenecteplase             -follow-up  with Dr. Florene Glen   9: Seizure-like activity: Keppra discontinued   1/8- no Tramadol for pain 10: AKI: creatinine at baseline; follow-up BMP   1/1- Cr very slightly elevated at 1.02 and BUN 14- con't to monitor  1/4- Cr stable at 1.02- and BUN normal- con't regimen  1/8- Cr down to 0.81 and BUN 16 11: Respiratory failure/pneumonia: completed antibiotics             -pulmonary toilet; albuterol nebs as needed             -continue Duoneb BID   1/4- Vascualr congestion has improved- small pleural effusion 12: Bilateral pleural effusions: repeat chest x-ray on 12/30>>improvement (see report above)             -diuresis continues with Lasix 40 mg daily   1/1- wait to reduce Lasix due to pulm vascular congestion  1/3- will recheck CXR tomorrow  1/4- Small pleural effusions- but vascular congestion is almost gone- will reduce Lasix to 20 mg daily and recheck CXR early next week. Monitor edema  1/8- looks great- will have PCP assess if to continue Lasix 20 mg daily- will start 20 mEq daily KCL since on lasix- and replete 40 mEq x1 today for K+ of 3.3  1/9- will con't Lasix 20 mg daily and KCL 20 mEq daily 13: Anemia: Hemoglobin with downward trend: 7.6; recheck CBC    1/1- Hb up to 8.3-  14: Rib fractures: pain control   1/4- using lidoderm patches and tylenol 15: Hypertension: monitor TID and prn             continue  Lasix 40 mg daily             -continue Norvasc 7.5 mg daily   1/3- well controlled- con't  regimen 16: DM/DKA: Ca1c >15.5 BGs q AC and q HS; (home meds: Jardiance 25 mg daily, Toujeo 100 units daily, Humulin R U500 60 units TID with meals)              -SSI             -Novolog 8 units with meals             -Semglee 18 units BID   1/1- CBGs 91 to 181- MUCH better- con't regimen for now  1/2- CBGs OK except 1 value of 331 last night- will check with pt if was snacking?  1/3- CBGs 64-149- will back off Semglee to 16 units BID- educated on getting Dexcom, etc outpt  1/4- CBGs  118-228- since decreased her Semglee due to multiple Cbgs of 60s- will maintain current regimen for now.   1/8- CBGs 117-173- MUCH better will send home on current regimen- I don't see how to reduce her with meals coverage since pretty low in AM- will have pt's PCP address- but pt was on Insulin at home prior- also speak to PCP about Dexcom, etc for pt's fear of getting CBG's checked.   17: Left upper lobe atelectasis: Concern for chronic obstruction of left upper lobe bronchus             -Recommend follow-up CT scan in 3 months versus other imaging   18: Hypokalemia: replete; follow-up BMP   1/1- K+ 3.7 19: Hyperlipidemia: Crestor   20: Hypomagnesemia: continue Mag-ox 400 mg daily; recheck mag 1/1 1/2- will increase Mg Ox to 400 mg BID 1/3- will recheck Mg Thursday 1/4- Mg 1.8- con't regimen 1/7: recheck ordered for tomorrow 21: Elevated TSH: T3, T4 pending   1/1- T4 is normal at 0.81- on lower side, but still in normal limits; however TSH is 9.8- will start 75 mcg Synthroid since has hx of TSH being elevated as well as borderline high in past as well- pt also c/o being cold all the time and dry skin/hair falling out.  22. Urine incontinence  1/1- will get timed voiding- due to incontinence- likely due to using Purewick so long, but will monitor  1/2- only overnight- likely due to [place]- will try timed voiding.   Continue timed voiding 23. Insomnia  1/2- Melatonin didn't work- will try Trazodone 25 mg QHS prn- and daughter will encourage her to take.   1/3- pt took 25 mg Trazodone- slept much better  1/4- sleeping great per daughter  1/7: continue trazodone 24. Mild anoxia  1/3- due to cardiac arrest/need for CPR- ordered SLP eval and they have picked her up- will need reminders due to [facility] deficits.     I spent a total of 33   minutes on total care today- >50% coordination of care- due to d/w PA about labile CBGs.    Doesn't need f/u with Dr Dagoberto Ligas- can see me AS needed.      LOS: 9 days A FACE TO FACE EVALUATION WAS PERFORMED  Dam Ashraf 05/04/2022, 8:37 AM

## 2022-05-04 NOTE — Progress Notes (Signed)
Closed nursing education and nursing care plan for discharge to home

## 2022-05-04 NOTE — Progress Notes (Signed)
Inpatient Rehabilitation Discharge Medication Review by a Pharmacist  A complete drug regimen review was completed for this patient to identify any potential clinically significant medication issues.  High Risk Drug Classes Is patient taking? Indication by Medication  Antipsychotic No   Anticoagulant Yes Apixaban - PE treatment  Antibiotic No   Opioid Yes Norco - prn pain  Antiplatelet No   Hypoglycemics/insulin Yes Insulin glargine, insulin aspart - DM  Vasoactive Medication Yes Amlodipine, losartan - HTN  Chemotherapy No   Other Yes Albuterol - PRN SOB Furosemide - edema Mg oxide, potassium chloride - supplement Pantoprazole - GERD ppx Rosuvastatin - HLD Synthroid - hypothyroid     Type of Medication Issue Identified Description of Issue Recommendation(s)  Drug Interaction(s) (clinically significant)     Duplicate Therapy     Allergy     No Medication Administration End Date     Incorrect Dose     Additional Drug Therapy Needed     Significant med changes from prior encounter (inform family/care partners about these prior to discharge).    Other       Clinically significant medication issues were identified that warrant physician communication and completion of prescribed/recommended actions by midnight of the next day:  No  Name of provider notified for urgent issues identified:    Provider Method of Notification:     Pharmacist comments:  New apixaban for PE, education completed w/ patient 12/29, copay $10/month   Time spent performing this drug regimen review (minutes): 20   Thank you for allowing pharmacy to be a part of this patient's care.  Dimple Nanas, PharmD, BCPS 05/04/2022 7:13 AM

## 2022-05-05 ENCOUNTER — Encounter: Payer: Self-pay | Admitting: Internal Medicine

## 2022-05-05 ENCOUNTER — Ambulatory Visit: Payer: Medicare PPO | Admitting: Internal Medicine

## 2022-05-05 ENCOUNTER — Other Ambulatory Visit (HOSPITAL_COMMUNITY): Payer: Self-pay

## 2022-05-05 VITALS — BP 122/68 | HR 95 | Temp 98.7°F | Ht 63.0 in | Wt 155.0 lb

## 2022-05-05 DIAGNOSIS — I2693 Single subsegmental pulmonary embolism without acute cor pulmonale: Secondary | ICD-10-CM

## 2022-05-05 DIAGNOSIS — R5381 Other malaise: Secondary | ICD-10-CM | POA: Diagnosis not present

## 2022-05-05 DIAGNOSIS — I2699 Other pulmonary embolism without acute cor pulmonale: Secondary | ICD-10-CM | POA: Insufficient documentation

## 2022-05-05 DIAGNOSIS — E119 Type 2 diabetes mellitus without complications: Secondary | ICD-10-CM | POA: Diagnosis not present

## 2022-05-05 DIAGNOSIS — R9389 Abnormal findings on diagnostic imaging of other specified body structures: Secondary | ICD-10-CM | POA: Diagnosis not present

## 2022-05-05 DIAGNOSIS — I469 Cardiac arrest, cause unspecified: Secondary | ICD-10-CM | POA: Diagnosis not present

## 2022-05-05 MED ORDER — LOSARTAN POTASSIUM 25 MG PO TABS
25.0000 mg | ORAL_TABLET | Freq: Every day | ORAL | 3 refills | Status: DC
  Filled 2022-05-05 – 2022-06-01 (×2): qty 90, 90d supply, fill #0
  Filled 2022-08-25: qty 90, 90d supply, fill #1
  Filled 2022-11-10: qty 90, 90d supply, fill #2
  Filled 2023-02-21: qty 90, 90d supply, fill #3

## 2022-05-05 MED ORDER — ROSUVASTATIN CALCIUM 20 MG PO TABS
20.0000 mg | ORAL_TABLET | Freq: Every day | ORAL | 3 refills | Status: DC
  Filled 2022-05-05 – 2022-06-01 (×2): qty 90, 90d supply, fill #0
  Filled 2022-08-25: qty 90, 90d supply, fill #1
  Filled 2022-11-10: qty 90, 90d supply, fill #2
  Filled 2023-02-21: qty 90, 90d supply, fill #3

## 2022-05-05 MED ORDER — MAGNESIUM OXIDE 400 MG PO TABS
400.0000 mg | ORAL_TABLET | Freq: Every day | ORAL | 3 refills | Status: DC
  Filled 2022-05-05 – 2022-06-10 (×7): qty 90, 90d supply, fill #0

## 2022-05-05 MED ORDER — GLUCOSE BLOOD VI STRP
ORAL_STRIP | 3 refills | Status: AC
  Filled 2022-05-05: qty 400, 90d supply, fill #0
  Filled 2022-05-11: qty 400, 100d supply, fill #0
  Filled 2022-05-25: qty 350, 88d supply, fill #0
  Filled 2022-08-23: qty 350, 88d supply, fill #1
  Filled 2022-11-10 – 2022-11-24 (×3): qty 350, 88d supply, fill #2
  Filled 2023-02-15: qty 350, 88d supply, fill #3

## 2022-05-05 MED ORDER — AMLODIPINE BESYLATE 2.5 MG PO TABS
7.5000 mg | ORAL_TABLET | Freq: Every day | ORAL | 3 refills | Status: DC
  Filled 2022-05-05 – 2022-06-01 (×2): qty 90, 30d supply, fill #0
  Filled 2022-06-28: qty 90, 30d supply, fill #1
  Filled 2022-07-26: qty 90, 30d supply, fill #2
  Filled 2022-08-24: qty 90, 30d supply, fill #3

## 2022-05-05 MED ORDER — LEVOTHYROXINE SODIUM 75 MCG PO TABS
75.0000 ug | ORAL_TABLET | Freq: Every day | ORAL | 3 refills | Status: DC
  Filled 2022-05-05 – 2022-06-01 (×2): qty 90, 90d supply, fill #0
  Filled 2022-08-25: qty 90, 90d supply, fill #1
  Filled 2022-11-10: qty 90, 90d supply, fill #2
  Filled 2023-02-21: qty 90, 90d supply, fill #3

## 2022-05-05 MED ORDER — POTASSIUM CHLORIDE CRYS ER 20 MEQ PO TBCR
20.0000 meq | EXTENDED_RELEASE_TABLET | Freq: Every day | ORAL | 3 refills | Status: DC
  Filled 2022-05-05 – 2022-06-15 (×3): qty 90, 90d supply, fill #0
  Filled 2022-08-28: qty 90, 90d supply, fill #1
  Filled 2022-12-06: qty 90, 90d supply, fill #2
  Filled 2023-03-07: qty 90, 90d supply, fill #3

## 2022-05-05 MED ORDER — APIXABAN 5 MG PO TABS
5.0000 mg | ORAL_TABLET | Freq: Two times a day (BID) | ORAL | 4 refills | Status: DC
  Filled 2022-05-05 – 2022-06-01 (×2): qty 60, 30d supply, fill #0
  Filled 2022-06-28: qty 60, 30d supply, fill #1
  Filled 2022-07-26: qty 60, 30d supply, fill #2
  Filled 2022-08-24: qty 60, 30d supply, fill #3
  Filled 2022-09-23 – 2022-10-06 (×2): qty 60, 30d supply, fill #4

## 2022-05-05 MED ORDER — INSULIN GLARGINE 100 UNIT/ML SOLOSTAR PEN
16.0000 [IU] | PEN_INJECTOR | Freq: Two times a day (BID) | SUBCUTANEOUS | 11 refills | Status: DC
  Filled 2022-05-05 – 2022-06-14 (×3): qty 15, 47d supply, fill #0
  Filled 2022-07-30: qty 15, 47d supply, fill #1
  Filled 2022-09-15: qty 15, 47d supply, fill #2
  Filled 2022-10-27: qty 15, 47d supply, fill #3
  Filled 2022-12-06: qty 15, 47d supply, fill #4

## 2022-05-05 MED ORDER — FUROSEMIDE 20 MG PO TABS
20.0000 mg | ORAL_TABLET | Freq: Every day | ORAL | 3 refills | Status: DC
  Filled 2022-05-05 – 2022-06-01 (×2): qty 90, 90d supply, fill #0
  Filled 2022-08-25: qty 90, 90d supply, fill #1
  Filled 2022-11-10: qty 90, 90d supply, fill #2
  Filled 2023-02-21: qty 90, 90d supply, fill #3

## 2022-05-05 NOTE — Assessment & Plan Note (Signed)
Completed course of IV cefipime, improved overall, declines further f/u imaging for now

## 2022-05-05 NOTE — Assessment & Plan Note (Signed)
New acute dec 31, stable on eliquis and will continue x 6 mo tx - rx given

## 2022-05-05 NOTE — Assessment & Plan Note (Signed)
To continue home PT

## 2022-05-05 NOTE — Assessment & Plan Note (Signed)
Lab Results  Component Value Date   HGBA1C >15.5 (H) 04/12/2022  Severe uncontrolled due to non compliance prior to recent critical illness; to continue current med tx, for dexcom G7 if ok with insurance

## 2022-05-05 NOTE — Assessment & Plan Note (Signed)
Pt with recent severe critical illness complicated by seizure now resolved.  Cont to follow

## 2022-05-05 NOTE — Patient Instructions (Addendum)
Please continue all other medications as before, and refills have been done if requested  Please know that the Eliquis for now should be a 6 month total treatment only, then change to Aspirin 81 mg per day  Please let us know if the Dexcom G7 or Freestyle Elenor Legato is covered with your insurance  Please have the pharmacy call with any other refills you may need.  Please continue your efforts at being more active, low cholesterol diet, and weight control.  Please keep your appointments with your specialists as you may have planned - Physical Therapy at home  No lab work needed today  Please make an Appointment to return in 4 months, or sooner if needed

## 2022-05-05 NOTE — Progress Notes (Signed)
Patient ID: Deborah Neal, female   DOB: [birth year], 72 y.o.   MRN: [id number]        Chief Complaint: follow up post hospn with daughter, dec 31 - jan 9  Chief Complaint  Patient presents with   Hospital follow up    Was not taking diabetic but has restarted at hospital visit        HPI:  CICI Deborah Neal is a 72 y.o. female here with recent hospn with cardiac arrest in ED after found down at home; Had seizure en route tx with benzo.  Pt intubated, lab c/w lactic acidosis, elev troponins and A1c > 15% with renal insufficiency.  CTA chest not able due to renal fxn, pt started heparin infusion, low BP t with levophed, also tx with tenecteplase.  With renal improvement, CTA chest confirmed suspicion for acute PE with small to RLL.  Extubate dec 23, tx with cefepime IV to dec 29 completing tx.  Lasix given for hypervolume.  MRI brain neg for acute, but EEG suggestive of severe to profound diffuse encephalopathy.  Also tx with keppra but stopped dec 29, no further seizure.  Heparin IV transitioned to eliquis.  Echo with normal EF dec 29.  Pt able for d/c to inpatient rehab for 3 hrs x 5 days/wk.  Lasix 40 mg contd daily.  Seen per ST for speech.  Levothyroxine 75 mg started jan 1 with elevated tSH.  Lasix reduced to 20 mg near d/c.  Pt did well with rehab Pt and d/c home.  At home, pt doing well overall, Pt denies chest pain, increased sob or doe, wheezing, orthopnea, PND, increased LE swelling, palpitations, dizziness or syncope. No falls, fever, ST, cough or dysuria.  No other new complaints.  Does ask for Dexcom G7 for CBG monitoring but not sure if covered by insurance.  Pt conts PT at home twice per wk.  No other new complaints  Needs new refills for all meds sent to [place], new to her.   Now admits to non compliance with tx for DM prior to hospn, but since d/c has overall very good med compliance.  Last CXR 1/8 with persistent bilateral airspace dz but improved on the right.  Pt declines further lab  or CXR today.         Wt Readings from Last 3 Encounters:  05/05/22 155 lb (70.3 kg)  05/04/22 166 lb 0.1 oz (75.3 kg)  04/24/22 163 lb 12.8 oz (74.3 kg)   BP Readings from Last 3 Encounters:  05/05/22 122/68  05/04/22 118/65  04/25/22 127/64         Past Medical History:  Diagnosis Date   ALLERGIC RHINITIS 01/29/2007   Arthritis    ASTHMA 01/29/2007   ASTHMA, WITH ACUTE EXACERBATION 06/24/2008   BACK PAIN 01/29/2009   CHEST PAIN 06/24/2008   Family history of adverse reaction to anesthesia    Patients daughter has N/V after anesthesia   GERD (gastroesophageal reflux disease)    History of shingles    HYPERLIPIDEMIA 01/29/2007   HYPERTENSION 01/29/2007   OSTEOPENIA 10/16/2007   Type II or unspecified type diabetes mellitus without mention of complication, uncontrolled 11/29/2013   Past Surgical History:  Procedure Laterality Date   CHOLECYSTECTOMY N/A 09/19/2015   Procedure: LAPAROSCOPIC CHOLECYSTECTOMY WITH INTRAOPERATIVE CHOLANGIOGRAM;  Surgeon: Jackolyn Confer, MD;  Location: [place] [facility];  Service: General;  Laterality: N/A;   COLONOSCOPY     TONSILLECTOMY  1956   VIDEO BRONCHOSCOPY Bilateral 03/28/2014  Procedure: VIDEO BRONCHOSCOPY WITHOUT FLUORO;  Surgeon: Tanda Rockers, MD;  Location: [facility];  Service: Cardiopulmonary;  Laterality: Bilateral;    reports that she quit smoking about 18 years ago. Her smoking use included cigarettes. She has never used smokeless tobacco. She reports that she does not drink alcohol and does not use drugs. family history includes Asthma in her daughter and father; Cancer in her mother; Diabetes in her father; Heart disease in her father; Hypertension in her father. Allergies  Allergen Reactions   Shingrix [Zoster Vac Recomb Adjuvanted]     Allergy to original live zoster - rash   Current Outpatient Medications on File Prior to Visit  Medication Sig Dispense Refill   Accu-Chek Softclix Lancets lancets Use as directed up to four times daily as  directed 100 each 0   acetaminophen (TYLENOL) 325 MG tablet Take 1-2 tablets (325-650 mg total) by mouth every 4 (four) hours as needed for mild pain.     blood glucose meter kit and supplies Use up to four times daily as directed. 1 each 0   HYDROcodone-acetaminophen (NORCO/VICODIN) 5-325 MG tablet Take 1 tablet by mouth every 4 (four) hours as needed for moderate pain. 20 tablet 0   Insulin Pen Needle (CAREFINE PEN NEEDLES) 32G X 4 MM MISC Use to inject insulin 4 times daily. 100 each 0   lidocaine (LIDODERM) 5 % Place 3 patches onto the skin daily. Remove & Discard patch within 12 hours or as directed by MD 30 patch 0   Multiple Vitamin (MULTIVITAMIN WITH MINERALS) TABS tablet Take 1 tablet by mouth daily.     No current facility-administered medications on file prior to visit.        ROS:  All others reviewed and negative.  Objective        PE:  BP 122/68 (BP Location: Left Arm, Patient Position: Sitting, Cuff Size: Large)   Pulse 95   Temp 98.7 F (37.1 C) (Oral)   Ht '5\' 3"'$  (1.6 m)   Wt 155 lb (70.3 kg)   SpO2 97%   BMI 27.46 kg/m                 Constitutional: Pt appears in NAD               HENT: Head: NCAT.                Right Ear: External ear normal.                 Left Ear: External ear normal.                Eyes: . Pupils are equal, round, and reactive to light. Conjunctivae and EOM are normal               Nose: without d/c or deformity               Neck: Neck supple. Gross normal ROM               Cardiovascular: Normal rate and regular rhythm.                 Pulmonary/Chest: Effort normal and breath sounds without rales or wheezing.                Abd:  Soft, NT, ND, + BS, no organomegaly               Neurological: Pt is alert. At baseline orientation, motor  grossly intact               Skin: Skin is warm. No rashes, no other new lesions, LE edema - none               Psychiatric: Pt behavior is normal without agitation   Micro: none  Cardiac tracings I  have personally interpreted today:  none  Pertinent Radiological findings (summarize): none   Lab Results  Component Value Date   WBC 4.6 05/03/2022   HGB 8.9 (L) 05/03/2022   HCT 29.4 (L) 05/03/2022   PLT 349 05/03/2022   GLUCOSE 121 (H) 05/03/2022   CHOL 212 (H) 04/24/2022   TRIG 143 04/24/2022   HDL 54 04/24/2022   LDLDIRECT 153.4 01/29/2009   LDLCALC 129 (H) 04/24/2022   ALT 43 04/26/2022   AST 28 04/26/2022   NA 139 05/03/2022   K 3.3 (L) 05/03/2022   CL 103 05/03/2022   CREATININE 0.81 05/03/2022   BUN 16 05/03/2022   CO2 27 05/03/2022   TSH 9.861 (H) 04/24/2022   INR 1.2 04/09/2022   HGBA1C >15.5 (H) 04/12/2022   MICROALBUR 2.7 (H) 07/22/2021   Assessment/Plan:  ARACELIS ULREY is a 72 y.o. Black or African American [2] female with  has a past medical history of ALLERGIC RHINITIS (01/29/2007), Arthritis, ASTHMA (01/29/2007), ASTHMA, WITH ACUTE EXACERBATION (06/24/2008), BACK PAIN (01/29/2009), CHEST PAIN (06/24/2008), Family history of adverse reaction to anesthesia, GERD (gastroesophageal reflux disease), History of shingles, HYPERLIPIDEMIA (01/29/2007), HYPERTENSION (01/29/2007), OSTEOPENIA (10/16/2007), and Type II or unspecified type diabetes mellitus without mention of complication, uncontrolled (11/29/2013).  Cardiac arrest ([place]) Pt with recent severe critical illness complicated by seizure now resolved.  Cont to follow  Debility To continue home PT  Abnormal chest CT Completed course of IV cefipime, improved overall, declines further f/u imaging for now  Diabetes mellitus type II, non insulin dependent ([place]) Lab Results  Component Value Date   HGBA1C >15.5 (H) 04/12/2022  Severe uncontrolled due to non compliance prior to recent critical illness; to continue current med tx, for dexcom G7 if ok with insurance  Pulmonary embolism ([place]) New acute dec 31, stable on eliquis and will continue x 6 mo tx - rx given  Followup: Return in about 4 months (around  09/03/2022).  [name], MD 05/05/2022 8:24 PM [facility]

## 2022-05-07 DIAGNOSIS — J45909 Unspecified asthma, uncomplicated: Secondary | ICD-10-CM | POA: Diagnosis not present

## 2022-05-07 DIAGNOSIS — J9601 Acute respiratory failure with hypoxia: Secondary | ICD-10-CM | POA: Diagnosis not present

## 2022-05-07 DIAGNOSIS — I472 Ventricular tachycardia, unspecified: Secondary | ICD-10-CM | POA: Diagnosis not present

## 2022-05-07 DIAGNOSIS — E119 Type 2 diabetes mellitus without complications: Secondary | ICD-10-CM | POA: Diagnosis not present

## 2022-05-07 DIAGNOSIS — J189 Pneumonia, unspecified organism: Secondary | ICD-10-CM | POA: Diagnosis not present

## 2022-05-07 DIAGNOSIS — N179 Acute kidney failure, unspecified: Secondary | ICD-10-CM | POA: Diagnosis not present

## 2022-05-07 DIAGNOSIS — I469 Cardiac arrest, cause unspecified: Secondary | ICD-10-CM | POA: Diagnosis not present

## 2022-05-07 DIAGNOSIS — I10 Essential (primary) hypertension: Secondary | ICD-10-CM | POA: Diagnosis not present

## 2022-05-07 DIAGNOSIS — I4891 Unspecified atrial fibrillation: Secondary | ICD-10-CM | POA: Diagnosis not present

## 2022-05-11 ENCOUNTER — Telehealth: Payer: Self-pay | Admitting: Internal Medicine

## 2022-05-11 ENCOUNTER — Other Ambulatory Visit (HOSPITAL_COMMUNITY): Payer: Self-pay

## 2022-05-11 NOTE — Telephone Encounter (Signed)
Maria from [facility] called for verbal orders to continue [facility] PT for 1X for 7 weeks.  Please call Verdis Frederickson to confirm:  [phone]

## 2022-05-12 NOTE — Telephone Encounter (Signed)
Ok for verbal orders ?

## 2022-05-12 NOTE — Telephone Encounter (Signed)
Please advise for orders to continue [facility] PT for 1X for 7 weeks.

## 2022-05-12 NOTE — Telephone Encounter (Signed)
Left message for Verdis Frederickson at [place] giving ok for verbal orders on a secure line

## 2022-05-14 DIAGNOSIS — J9601 Acute respiratory failure with hypoxia: Secondary | ICD-10-CM | POA: Diagnosis not present

## 2022-05-14 DIAGNOSIS — I4891 Unspecified atrial fibrillation: Secondary | ICD-10-CM | POA: Diagnosis not present

## 2022-05-14 DIAGNOSIS — I10 Essential (primary) hypertension: Secondary | ICD-10-CM | POA: Diagnosis not present

## 2022-05-14 DIAGNOSIS — I472 Ventricular tachycardia, unspecified: Secondary | ICD-10-CM | POA: Diagnosis not present

## 2022-05-14 DIAGNOSIS — J45909 Unspecified asthma, uncomplicated: Secondary | ICD-10-CM | POA: Diagnosis not present

## 2022-05-14 DIAGNOSIS — N179 Acute kidney failure, unspecified: Secondary | ICD-10-CM | POA: Diagnosis not present

## 2022-05-14 DIAGNOSIS — E119 Type 2 diabetes mellitus without complications: Secondary | ICD-10-CM | POA: Diagnosis not present

## 2022-05-14 DIAGNOSIS — I469 Cardiac arrest, cause unspecified: Secondary | ICD-10-CM | POA: Diagnosis not present

## 2022-05-14 DIAGNOSIS — J189 Pneumonia, unspecified organism: Secondary | ICD-10-CM | POA: Diagnosis not present

## 2022-05-18 DIAGNOSIS — I472 Ventricular tachycardia, unspecified: Secondary | ICD-10-CM | POA: Diagnosis not present

## 2022-05-18 DIAGNOSIS — E119 Type 2 diabetes mellitus without complications: Secondary | ICD-10-CM | POA: Diagnosis not present

## 2022-05-18 DIAGNOSIS — I469 Cardiac arrest, cause unspecified: Secondary | ICD-10-CM | POA: Diagnosis not present

## 2022-05-18 DIAGNOSIS — J9601 Acute respiratory failure with hypoxia: Secondary | ICD-10-CM | POA: Diagnosis not present

## 2022-05-18 DIAGNOSIS — J189 Pneumonia, unspecified organism: Secondary | ICD-10-CM | POA: Diagnosis not present

## 2022-05-18 DIAGNOSIS — J45909 Unspecified asthma, uncomplicated: Secondary | ICD-10-CM | POA: Diagnosis not present

## 2022-05-18 DIAGNOSIS — I10 Essential (primary) hypertension: Secondary | ICD-10-CM | POA: Diagnosis not present

## 2022-05-18 DIAGNOSIS — N179 Acute kidney failure, unspecified: Secondary | ICD-10-CM | POA: Diagnosis not present

## 2022-05-18 DIAGNOSIS — I4891 Unspecified atrial fibrillation: Secondary | ICD-10-CM | POA: Diagnosis not present

## 2022-05-20 ENCOUNTER — Encounter: Payer: Self-pay | Admitting: Physician Assistant

## 2022-05-20 NOTE — Progress Notes (Signed)
Cardiology Office Note    Date:  05/21/2022   ID:  Shaterra, [place] 02-24-1951, MRN [id number]  PCP:  Biagio Borg, MD  Cardiologist:  Lauree Chandler, MD  Electrophysiologist:  None   Chief Complaint: f/u hospitalization for cardiac arrest  History of Present Illness:   Deborah Neal is a 72 y.o. female with history of HTN, HLD, uncontrolled DM2, asthma, recent cardiac arrest due to massive PE with complex hospitalization seen for follow-up.   She was recently admitted 03/2022 with complex hospital course. She was in her USOH until approximately 5:30am when last seen by granddaughter day of admission. Family attempted to call her around 7am and she did not answer. When they went to check on her around 10am, she was unresponsive in bed with red emesis around her. Upon fire arrival, she was unresponsive but did have pulse/respirations though began having seizure activity. Upon EMS arrival she lost pulses and CPR was initiated, <5 minutes. She had seizure like activity en route to [facility] requiring benzodiazepine and was intubated on arrival. She remained unresponsive and PCCM was asked to admit. Presenting EKG showed sinus tach with prolonged QT interval, nonspecific STTW changes, no STEMI. 2D echo showed mobile echodensity in the RV outflow tract concerning for clot in transit/PE, otherwise with normal EF 65-70%, G1DD, mild LVH, mildly reduced RVSF, mildly elevated PASP. Initial Cr 2.06, glucose 1194, K 3.3, lactate >9, WBC 21k, troponins 97->1355->1133 (A1c >15.5). CTA chest not able to be pursued due to renal dysfunction. She was initially started on heparin due to concern for massive PE with low threshold for lytics for decompensation. That evening she dropped her BP to the 40s requiring Levophed then went into cardiac arrest with VT/VF per notes, ROSC achieved after 2 minutes. She was subsequently treated with TNKase. LE [place] was negative for DVT. Other hospital issues included acute hypoxic  respiratory failure/multifocal PNA with serratia marcescens, aspiration PNA, bilateral pleural effusions, AKI with creatinine peak of 5.64 requiring nephrology involvement for diuretics with diffuse body wall edema, hypoalbuminemia, anemia, hematuria, concern for obstruction of LUL bronchus (to be followed in future with CT versus PET), sacral pressure injury, hypernatremia, hypokalemia, prolonged QT interval, and DKA. Repeat limited echo 04/23/22 to re-eval pericardial effusion showed EF 60-65%, moderate pulm HTN, no pericardial effusion. Cardiology was asked to see her for her cardiac arrest, felt due to the massive PE. No further cardiac workup was felt necessary as troponin elevation was felt due to the pulmonary embolism.  She returns for follow-up today with the sister that found her the first day of the events. She is doing remarkably well and denies CP, SOB, palpitations, edema, syncope or any symptoms at all. The only thing she notes is that she is drinking Ensure and it runs her sugar up at times. I recommended she discuss this with her PCP. She saw [name] 1/10 (who notes pt deferred further imaging for lung finding). She also has upcoming f/u with pulmonology.   Labwork independently reviewed: 04/2022 Mg 1.7, Hgb 8.9, plt 349, K 3.3, Cr 0.81, TSH 9.8 with normal FT4/T3, LDL 129, trig 143, albumin 2.3, AST 29, ALT 50, A1c >15.5   Past History   Past Medical History:  Diagnosis Date   Acute massive pulmonary embolism ([place])    ALLERGIC RHINITIS 01/29/2007   Arthritis    ASTHMA 01/29/2007   ASTHMA, WITH ACUTE EXACERBATION 06/24/2008   BACK PAIN 01/29/2009   Cardiac arrest [facility])    CHEST  PAIN 06/24/2008   Family history of adverse reaction to anesthesia    Patients daughter has N/V after anesthesia   GERD (gastroesophageal reflux disease)    History of shingles    HYPERLIPIDEMIA 01/29/2007   HYPERTENSION 01/29/2007   OSTEOPENIA 10/16/2007   Type II or unspecified type diabetes  mellitus without mention of complication, uncontrolled 11/29/2013    Past Surgical History:  Procedure Laterality Date   CHOLECYSTECTOMY N/A 09/19/2015   Procedure: LAPAROSCOPIC CHOLECYSTECTOMY WITH INTRAOPERATIVE CHOLANGIOGRAM;  Surgeon: Jackolyn Confer, MD;  Location: [facility];  Service: General;  Laterality: N/A;   COLONOSCOPY     TONSILLECTOMY  1956   VIDEO BRONCHOSCOPY Bilateral 03/28/2014   Procedure: VIDEO BRONCHOSCOPY WITHOUT FLUORO;  Surgeon: Tanda Rockers, MD;  Location: [facility];  Service: Cardiopulmonary;  Laterality: Bilateral;    Current Medications: Current Meds  Medication Sig   Accu-Chek Softclix Lancets lancets Use as directed up to four times daily as directed   acetaminophen (TYLENOL) 325 MG tablet Take 1-2 tablets (325-650 mg total) by mouth every 4 (four) hours as needed for mild pain.   amLODipine (NORVASC) 2.5 MG tablet Take 3 tablets (7.5 mg total) by mouth daily.   apixaban (ELIQUIS) 5 MG TABS tablet Take 1 tablet (5 mg total) by mouth 2 (two) times daily.   blood glucose meter kit and supplies Use up to four times daily as directed.   furosemide (LASIX) 20 MG tablet Take 1 tablet (20 mg total) by mouth daily.   glucose blood test strip Use as directed up to four times daily as directed   HYDROcodone-acetaminophen (NORCO/VICODIN) 5-325 MG tablet Take 1 tablet by mouth every 4 (four) hours as needed for moderate pain.   insulin glargine (LANTUS) 100 UNIT/ML Solostar Pen Inject 16 Units into the skin 2 (two) times daily.   Insulin Pen Needle (CAREFINE PEN NEEDLES) 32G X 4 MM MISC Use to inject insulin 4 times daily.   levothyroxine (SYNTHROID) 75 MCG tablet Take 1 tablet (75 mcg total) by mouth daily at 6 (six) AM.   lidocaine (LIDODERM) 5 % Place 3 patches onto the skin daily. Remove & Discard patch within 12 hours or as directed by MD   losartan (COZAAR) 25 MG tablet Take 1 tablet (25 mg total) by mouth daily.   magnesium oxide (MAG-OX) 400 MG tablet Take 1  tablet (400 mg total) by mouth daily.   Multiple Vitamin (MULTIVITAMIN WITH MINERALS) TABS tablet Take 1 tablet by mouth daily.   potassium chloride SA (KLOR-CON M) 20 MEQ tablet Take 1 tablet (20 mEq total) by mouth daily.   rosuvastatin (CRESTOR) 20 MG tablet Take 1 tablet (20 mg total) by mouth daily.      Allergies:   Shingrix [zoster vac recomb adjuvanted]   Social History   Socioeconomic History   Marital status: Single    Spouse name: Not on file   Number of children: Not on file   Years of education: Not on file   Highest education level: Not on file  Occupational History   Not on file  Tobacco Use   Smoking status: Former    Types: Cigarettes    Quit date: 09/07/2003    Years since quitting: 18.7   Smokeless tobacco: Never  Vaping Use   Vaping Use: Never used  Substance and Sexual Activity   Alcohol use: No   Drug use: No   Sexual activity: Never  Other Topics Concern   Not on file  Social History Narrative  Not on file   Social Determinants of Health   Financial Resource Strain: Not on file  Food Insecurity: Not on file  Transportation Needs: Not on file  Physical Activity: Not on file  Stress: Not on file  Social Connections: Not on file     Family History:  The patient's family history includes Asthma in her daughter and father; Cancer in her mother; Diabetes in her father; Heart disease in her father; Hypertension in her father. There is no history of Colon cancer, Esophageal cancer, Rectal cancer, Stomach cancer, or Breast cancer.  ROS:   Please see the history of present illness.  All other systems are reviewed and otherwise negative.    EKG(s)/Additional Testing   EKG:  EKG is ordered today, personally reviewed, demonstrating NSR 89bpm, poor R wave progression, nonspecific STTW changes, QTc improved to 470m  CV Studies: Cardiac studies reviewed are outlined and summarized above. Otherwise please see EMR for full report.  Recent  Labs: 04/09/2022: B Natriuretic Peptide 153.2 04/24/2022: TSH 9.861 04/26/2022: ALT 43 05/03/2022: BUN 16; Creatinine, Ser 0.81; Hemoglobin 8.9; Magnesium 1.7; Platelets 349; Potassium 3.3; Sodium 139  Recent Lipid Panel    Component Value Date/Time   CHOL 212 (H) 04/24/2022 0937   TRIG 143 04/24/2022 0937   HDL 54 04/24/2022 0937   CHOLHDL 3.9 04/24/2022 0937   VLDL 29 04/24/2022 0937   LDLCALC 129 (H) 04/24/2022 0937   LDLCALC 50 01/09/2020 1404   LDLDIRECT 153.4 01/29/2009 1412    PHYSICAL EXAM:    VS:  BP 116/64   Pulse 89   Ht '5\' 1"'$  (1.549 m)   Wt 153 lb (69.4 kg)   SpO2 98%   BMI 28.91 kg/m   BMI: Body mass index is 28.91 kg/m.  GEN: Well nourished, well developed female in no acute distress HEENT: normocephalic, atraumatic Neck: no JVD, carotid bruits, or masses Cardiac: RRR; no murmurs, rubs, or gallops, no edema  Respiratory:  clear to auscultation bilaterally, normal work of breathing GI: soft, nontender, nondistended, + BS MS: no deformity or atrophy Skin: warm and dry, no rash Neuro:  Alert and Oriented x 3, Strength and sensation are intact, follows commands Psych: euthymic mood, full affect  Wt Readings from Last 3 Encounters:  05/21/22 153 lb (69.4 kg)  05/05/22 155 lb (70.3 kg)  05/04/22 166 lb 0.1 oz (75.3 kg)     ASSESSMENT & PLAN:   1. Cardiac arrest with elevated troponin - this was felt due to massive PE, which will now be managed by PCP/pulmonology. From cardiac standpoint she is doing well and Dr. MAngelena Formdid not feel further ischemic/coronary workup was indicated as presentation was felt due to PE. She denies any CP antecedent to the event or now. Reviewed warning sx. She will notify for any new changes in symptoms. Will obtain f/u CBC to ensure stable since anemic in the hospital.   2. Moderate pulmonary HTN - it was previously suggested in the hospital to recheck in 3 months, will arrange. Suspect this was primarily driven by massive PE. She  appears euvolemic on exam and remains on Lasix/KCl. Since doing well will continue this regimen but if labs demonstrate any worsening renal function, can stop and change to PRN.  3. Small pericardial effusion - noted on initial echo, resolved on limited recheck 04/23/22. No further intervention needed at this time.  4. Prolonged QT interval - resolved. Will recheck lytes today as well as LFTs given her abnormal values in the hospital.  5. Abnormal thyroid function testing - do not see this was obtained post D/C, will recheck today.     Disposition: F/u with me in 3 months after echo.   Medication Adjustments/Labs and Tests Ordered: Current medicines are reviewed at length with the patient today.  Concerns regarding medicines are outlined above. Medication changes, Labs and Tests ordered today are summarized above and listed in the Patient Instructions accessible in Encounters.   Signed, Charlie Pitter, PA-C  05/21/2022 10:54 AM    [place] Phone: [phone]; Fax: [phone]

## 2022-05-21 ENCOUNTER — Encounter: Payer: Self-pay | Admitting: Physician Assistant

## 2022-05-21 ENCOUNTER — Ambulatory Visit: Payer: Medicare PPO | Attending: Physician Assistant | Admitting: Physician Assistant

## 2022-05-21 VITALS — BP 116/64 | HR 89 | Ht 61.0 in | Wt 153.0 lb

## 2022-05-21 DIAGNOSIS — I272 Pulmonary hypertension, unspecified: Secondary | ICD-10-CM

## 2022-05-21 DIAGNOSIS — D649 Anemia, unspecified: Secondary | ICD-10-CM | POA: Diagnosis not present

## 2022-05-21 DIAGNOSIS — R946 Abnormal results of thyroid function studies: Secondary | ICD-10-CM

## 2022-05-21 DIAGNOSIS — I3139 Other pericardial effusion (noninflammatory): Secondary | ICD-10-CM

## 2022-05-21 DIAGNOSIS — R9431 Abnormal electrocardiogram [ECG] [EKG]: Secondary | ICD-10-CM | POA: Diagnosis not present

## 2022-05-21 DIAGNOSIS — I469 Cardiac arrest, cause unspecified: Secondary | ICD-10-CM

## 2022-05-21 DIAGNOSIS — R7989 Other specified abnormal findings of blood chemistry: Secondary | ICD-10-CM | POA: Diagnosis not present

## 2022-05-21 NOTE — Patient Instructions (Addendum)
Medication Instructions:  Your physician recommends that you continue on your current medications as directed. Please refer to the Current Medication list given to you today.  *If you need a refill on your cardiac medications before your next appointment, please call your pharmacy*   Lab Work: CMET, Mag, CBC, TSH, FT4 today If you have labs (blood work) drawn today and your tests are completely normal, you will receive your results only by: [place] (if you have MyChart) OR A paper copy in the mail If you have any lab test that is abnormal or we need to change your treatment, we will call you to review the results.   Testing/Procedures: Your physician has requested that you have an echocardiogram after 07/23/22. Echocardiography is a painless test that uses sound waves to create images of your heart. It provides your doctor with information about the size and shape of your heart and how well your heart's chambers and valves are working. This procedure takes approximately one hour. There are no restrictions for this procedure. Please do NOT wear cologne, perfume, aftershave, or lotions (deodorant is allowed). Please arrive 15 minutes prior to your appointment time.    Follow-Up: At [facility], you and your health needs are our priority.  As part of our continuing mission to provide you with exceptional heart care, we have created designated Provider Care Teams.  These Care Teams include your primary Cardiologist (physician) and Advanced Practice Providers (APPs -  Physician Assistants and Nurse Practitioners) who all work together to provide you with the care you need, when you need it.  We recommend signing up for the patient portal called "MyChart".  Sign up information is provided on this After Visit Summary.  MyChart is used to connect with patients for Virtual Visits (Telemedicine).  Patients are able to view lab/test results, encounter notes, upcoming appointments, etc.   Non-urgent messages can be sent to your provider as well.   To learn more about what you can do with MyChart, go to [email or web address].    Your next appointment:   3 month(s)  Provider:   Melina Copa, PA-C

## 2022-05-24 ENCOUNTER — Encounter: Payer: Self-pay | Admitting: Pulmonary Disease

## 2022-05-24 ENCOUNTER — Ambulatory Visit: Payer: Medicare PPO | Admitting: Pulmonary Disease

## 2022-05-24 VITALS — BP 104/62 | HR 80 | Ht 63.0 in | Wt 154.6 lb

## 2022-05-24 DIAGNOSIS — J189 Pneumonia, unspecified organism: Secondary | ICD-10-CM | POA: Diagnosis not present

## 2022-05-24 DIAGNOSIS — I472 Ventricular tachycardia, unspecified: Secondary | ICD-10-CM | POA: Diagnosis not present

## 2022-05-24 DIAGNOSIS — Z8674 Personal history of sudden cardiac arrest: Secondary | ICD-10-CM

## 2022-05-24 DIAGNOSIS — U071 COVID-19: Secondary | ICD-10-CM | POA: Diagnosis not present

## 2022-05-24 DIAGNOSIS — R0683 Snoring: Secondary | ICD-10-CM

## 2022-05-24 DIAGNOSIS — J9601 Acute respiratory failure with hypoxia: Secondary | ICD-10-CM | POA: Diagnosis not present

## 2022-05-24 DIAGNOSIS — J181 Lobar pneumonia, unspecified organism: Secondary | ICD-10-CM

## 2022-05-24 DIAGNOSIS — I270 Primary pulmonary hypertension: Secondary | ICD-10-CM | POA: Diagnosis not present

## 2022-05-24 DIAGNOSIS — N179 Acute kidney failure, unspecified: Secondary | ICD-10-CM | POA: Diagnosis not present

## 2022-05-24 DIAGNOSIS — J1282 Pneumonia due to coronavirus disease 2019: Secondary | ICD-10-CM

## 2022-05-24 DIAGNOSIS — E119 Type 2 diabetes mellitus without complications: Secondary | ICD-10-CM | POA: Diagnosis not present

## 2022-05-24 DIAGNOSIS — I10 Essential (primary) hypertension: Secondary | ICD-10-CM | POA: Diagnosis not present

## 2022-05-24 DIAGNOSIS — I4891 Unspecified atrial fibrillation: Secondary | ICD-10-CM | POA: Diagnosis not present

## 2022-05-24 DIAGNOSIS — I469 Cardiac arrest, cause unspecified: Secondary | ICD-10-CM | POA: Diagnosis not present

## 2022-05-24 DIAGNOSIS — J45909 Unspecified asthma, uncomplicated: Secondary | ICD-10-CM | POA: Diagnosis not present

## 2022-05-24 LAB — COMPREHENSIVE METABOLIC PANEL
ALT: 14 IU/L (ref 0–32)
AST: 16 IU/L (ref 0–40)
Albumin/Globulin Ratio: 1.7 (ref 1.2–2.2)
Albumin: 4.2 g/dL (ref 3.9–4.9)
Alkaline Phosphatase: 149 IU/L — ABNORMAL HIGH (ref 44–121)
BUN/Creatinine Ratio: 20 (ref 12–28)
BUN: 20 mg/dL (ref 8–27)
Bilirubin Total: <0.2 mg/dL (ref 0.0–1.2)
CO2: 25 mmol/L (ref 20–29)
Calcium: 10.4 mg/dL — ABNORMAL HIGH (ref 8.7–10.3)
Chloride: 103 mmol/L (ref 96–106)
Creatinine, Ser: 1.01 mg/dL — ABNORMAL HIGH (ref 0.57–1.00)
Globulin, Total: 2.5 g/dL (ref 1.5–4.5)
Glucose: 206 mg/dL — ABNORMAL HIGH (ref 70–99)
Potassium: 4.3 mmol/L (ref 3.5–5.2)
Sodium: 142 mmol/L (ref 134–144)
Total Protein: 6.7 g/dL (ref 6.0–8.5)
eGFR: 60 mL/min/{1.73_m2} (ref >59)

## 2022-05-24 LAB — CBC
Hematocrit: 36.5 % (ref 34.0–46.6)
Hemoglobin: 10.8 g/dL — ABNORMAL LOW (ref 11.1–15.9)
MCH: 22.4 pg — ABNORMAL LOW (ref 26.6–33.0)
MCHC: 29.6 g/dL — ABNORMAL LOW (ref 31.5–35.7)
MCV: 76 fL — ABNORMAL LOW (ref 79–97)
Platelets: 312 10*3/uL (ref 150–450)
RBC: 4.82 x10E6/uL (ref 3.77–5.28)
RDW: 14.5 % (ref 11.7–15.4)
WBC: 6 10*3/uL (ref 3.4–10.8)

## 2022-05-24 LAB — TSH: TSH: 1.43 u[IU]/mL (ref 0.450–4.500)

## 2022-05-24 LAB — T4, FREE: Free T4: 1.44 ng/dL (ref 0.82–1.77)

## 2022-05-24 LAB — MAGNESIUM: Magnesium: 2.1 mg/dL (ref 1.6–2.3)

## 2022-05-24 NOTE — Addendum Note (Signed)
Addended by: Gwendlyn Deutscher on: 05/24/2022 05:05 PM   Modules accepted: Orders

## 2022-05-24 NOTE — Patient Instructions (Addendum)
Abnormal CT scan of the chest with multifocal infiltrate  Will repeat CT scan in about 4 to 6 weeks  Follow-up in 6 to 8 weeks  Graded exercise as tolerated-regular walking, regular exercise and  Continue to work on keeping your sugar controlled  Call with significant concerns

## 2022-05-24 NOTE — Progress Notes (Signed)
Deborah Neal    [id number]    1950-09-08  Primary Care Physician:John, Hunt Oris, MD  Referring Physician: Biagio Borg, MD [address] [place],   [address]  Chief complaint:   Patient being seen for follow-up recent hospitalization  HPI:  Sustained a cardiac arrest 04/09/2022 Was not having any breathing problems or acute issues prior to event she remembers feeling well prior to event it was when she was not able to be contacted that family alerted EMS She was later found unresponsive with some emesis around her Noted to about a seizure by EMS, had to have CPR, lasted less than 5 minutes  Transported to the hospital, intubated  Had V-fib V. tach arrest in the setting of severe hypokalemia in the hospital  It was presumed that initial event may have been related to massive pulmonary embolism prior to hospitalization, did receive TNKase Lower extremity ultrasound during the hospitalization was negative for DVT, echo was suggestive of clot in transit CT PE protocol with PE with very small thrombus burden on 12/28 Was treated for Serratia pneumonia with bilateral infiltrates  Pulmonary pressures was elevated during hospitalization-may have been due to acute illness  Extubated after about 8 days  Never smoker Does not recollect having significant problems prior to event  Since she has been home, symptoms continue to feel better able to ambulate Able to tolerate activities of daily living   Outpatient Encounter Medications as of 05/24/2022  Medication Sig   Accu-Chek Softclix Lancets lancets Use as directed up to four times daily as directed   acetaminophen (TYLENOL) 325 MG tablet Take 1-2 tablets (325-650 mg total) by mouth every 4 (four) hours as needed for mild pain.   amLODipine (NORVASC) 2.5 MG tablet Take 3 tablets (7.5 mg total) by mouth daily.   apixaban (ELIQUIS) 5 MG TABS tablet Take 1 tablet (5 mg total) by mouth 2 (two) times daily.   blood  glucose meter kit and supplies Use up to four times daily as directed.   furosemide (LASIX) 20 MG tablet Take 1 tablet (20 mg total) by mouth daily.   glucose blood test strip Use as directed up to four times daily as directed   HYDROcodone-acetaminophen (NORCO/VICODIN) 5-325 MG tablet Take 1 tablet by mouth every 4 (four) hours as needed for moderate pain.   insulin glargine (LANTUS) 100 UNIT/ML Solostar Pen Inject 16 Units into the skin 2 (two) times daily.   Insulin Pen Needle (CAREFINE PEN NEEDLES) 32G X 4 MM MISC Use to inject insulin 4 times daily.   levothyroxine (SYNTHROID) 75 MCG tablet Take 1 tablet (75 mcg total) by mouth daily at 6 (six) AM.   lidocaine (LIDODERM) 5 % Place 3 patches onto the skin daily. Remove & Discard patch within 12 hours or as directed by MD   losartan (COZAAR) 25 MG tablet Take 1 tablet (25 mg total) by mouth daily.   magnesium oxide (MAG-OX) 400 MG tablet Take 1 tablet (400 mg total) by mouth daily.   Multiple Vitamin (MULTIVITAMIN WITH MINERALS) TABS tablet Take 1 tablet by mouth daily.   potassium chloride SA (KLOR-CON M) 20 MEQ tablet Take 1 tablet (20 mEq total) by mouth daily.   rosuvastatin (CRESTOR) 20 MG tablet Take 1 tablet (20 mg total) by mouth daily.   No facility-administered encounter medications on file as of 05/24/2022.    Allergies as of 05/24/2022 - Review Complete 05/24/2022  Allergen Reaction Noted  Shingrix [zoster vac recomb adjuvanted]  01/22/2022    Past Medical History:  Diagnosis Date   Acute massive pulmonary embolism ([place])    ALLERGIC RHINITIS 01/29/2007   Arthritis    ASTHMA 01/29/2007   ASTHMA, WITH ACUTE EXACERBATION 06/24/2008   BACK PAIN 01/29/2009   Cardiac arrest [facility])    CHEST PAIN 06/24/2008   Family history of adverse reaction to anesthesia    Patients daughter has N/V after anesthesia   GERD (gastroesophageal reflux disease)    History of shingles    HYPERLIPIDEMIA 01/29/2007   HYPERTENSION 01/29/2007    OSTEOPENIA 10/16/2007   Type II or unspecified type diabetes mellitus without mention of complication, uncontrolled 11/29/2013    Past Surgical History:  Procedure Laterality Date   CHOLECYSTECTOMY N/A 09/19/2015   Procedure: LAPAROSCOPIC CHOLECYSTECTOMY WITH INTRAOPERATIVE CHOLANGIOGRAM;  Surgeon: Jackolyn Confer, MD;  Location: [place];  Service: General;  Laterality: N/A;   COLONOSCOPY     TONSILLECTOMY  1956   VIDEO BRONCHOSCOPY Bilateral 03/28/2014   Procedure: VIDEO BRONCHOSCOPY WITHOUT FLUORO;  Surgeon: Tanda Rockers, MD;  Location[facility];  Service: Cardiopulmonary;  Laterality: Bilateral;    Family History  Problem Relation Age of Onset   Cancer Mother        ? type    Diabetes Father    Hypertension Father    Heart disease Father    Asthma Father    Asthma Daughter    Colon cancer Neg Hx    Esophageal cancer Neg Hx    Rectal cancer Neg Hx    Stomach cancer Neg Hx    Breast cancer Neg Hx     Social History   Socioeconomic History   Marital status: Single    Spouse name: Not on file   Number of children: Not on file   Years of education: Not on file   Highest education level: Not on file  Occupational History   Not on file  Tobacco Use   Smoking status: Former    Types: Cigarettes    Quit date: 09/07/2003    Years since quitting: 18.7   Smokeless tobacco: Never  Vaping Use   Vaping Use: Never used  Substance and Sexual Activity   Alcohol use: No   Drug use: No   Sexual activity: Never  Other Topics Concern   Not on file  Social History Narrative   Not on file   Social Determinants of Health   Financial Resource Strain: Not on file  Food Insecurity: Not on file  Transportation Needs: Not on file  Physical Activity: Not on file  Stress: Not on file  Social Connections: Not on file  Intimate Partner Violence: Not on file    Review of Systems  Respiratory:  Positive for shortness of breath.     Vitals:   05/24/22 1519  BP: 104/62   Pulse: 80  SpO2: 98%     Physical Exam Constitutional:      Appearance: She is obese.  HENT:     Head: Normocephalic.     Mouth/Throat:     Mouth: Mucous membranes are moist.  Cardiovascular:     Rate and Rhythm: Normal rate and regular rhythm.     Heart sounds: No murmur heard.    No friction rub.  Pulmonary:     Effort: No respiratory distress.     Breath sounds: No stridor. No wheezing or rhonchi.  Musculoskeletal:     Cervical back: No rigidity or tenderness.  Neurological:  Mental Status: She is alert.  Psychiatric:        Mood and Affect: Mood normal.      Data Reviewed: Recent discharge records reviewed  Assessment:  Recent cardiorespiratory arrest  Multilobar pneumonia  History of diabetes  Pulmonary hypertension on recent echocardiogram  History of pulmonary embolism   Plan/Recommendations:  Needs repeat CT in 6 weeks  Order for echocardiogram to assess pulmonary pressures in 3 months  Schedule for in lab sleep study  Continue anticoagulation  Records adequately reviewed, will follow-up with CT, echocardiogram to follow-up on pulmonary pressures, concern for sleep disordered breathing noted in the hospital records -Will obtain an in lab split-night study   Sherrilyn Rist MD [facility] 05/24/2022, 3:59 PM  CC: Biagio Borg, MD

## 2022-05-25 ENCOUNTER — Other Ambulatory Visit (HOSPITAL_COMMUNITY): Payer: Self-pay

## 2022-05-25 ENCOUNTER — Other Ambulatory Visit: Payer: Self-pay

## 2022-05-25 ENCOUNTER — Telehealth: Payer: Self-pay

## 2022-05-25 DIAGNOSIS — J189 Pneumonia, unspecified organism: Secondary | ICD-10-CM | POA: Diagnosis not present

## 2022-05-25 DIAGNOSIS — E119 Type 2 diabetes mellitus without complications: Secondary | ICD-10-CM | POA: Diagnosis not present

## 2022-05-25 DIAGNOSIS — J45909 Unspecified asthma, uncomplicated: Secondary | ICD-10-CM | POA: Diagnosis not present

## 2022-05-25 DIAGNOSIS — I472 Ventricular tachycardia, unspecified: Secondary | ICD-10-CM | POA: Diagnosis not present

## 2022-05-25 DIAGNOSIS — I4891 Unspecified atrial fibrillation: Secondary | ICD-10-CM | POA: Diagnosis not present

## 2022-05-25 DIAGNOSIS — J9601 Acute respiratory failure with hypoxia: Secondary | ICD-10-CM | POA: Diagnosis not present

## 2022-05-25 DIAGNOSIS — I10 Essential (primary) hypertension: Secondary | ICD-10-CM | POA: Diagnosis not present

## 2022-05-25 DIAGNOSIS — N179 Acute kidney failure, unspecified: Secondary | ICD-10-CM | POA: Diagnosis not present

## 2022-05-25 DIAGNOSIS — I469 Cardiac arrest, cause unspecified: Secondary | ICD-10-CM | POA: Diagnosis not present

## 2022-05-25 NOTE — Telephone Encounter (Signed)
-----  Message from Charlie Pitter, [place] sent at 05/24/2022  5:29 PM EST ----- Hi triage, Please let pt know labs are stable, mild chronic kidney disease looks at baseline. Noncardiac abnormalities are present for which I would recommend she review with her PCP: borderline elevated calcium and elevated alk phos level. I will cc [name] here in case he would like to trend these - she had already seen him for close post-hospital f/u. She also has mild anemia though this is improving thankfully. Otherwise continue plan for 3 month follow-up as discussed.

## 2022-05-27 DIAGNOSIS — J45909 Unspecified asthma, uncomplicated: Secondary | ICD-10-CM | POA: Diagnosis not present

## 2022-05-27 DIAGNOSIS — N179 Acute kidney failure, unspecified: Secondary | ICD-10-CM | POA: Diagnosis not present

## 2022-05-27 DIAGNOSIS — J189 Pneumonia, unspecified organism: Secondary | ICD-10-CM | POA: Diagnosis not present

## 2022-05-27 DIAGNOSIS — I4891 Unspecified atrial fibrillation: Secondary | ICD-10-CM | POA: Diagnosis not present

## 2022-05-27 DIAGNOSIS — E119 Type 2 diabetes mellitus without complications: Secondary | ICD-10-CM | POA: Diagnosis not present

## 2022-05-27 DIAGNOSIS — I10 Essential (primary) hypertension: Secondary | ICD-10-CM | POA: Diagnosis not present

## 2022-05-27 DIAGNOSIS — J9601 Acute respiratory failure with hypoxia: Secondary | ICD-10-CM | POA: Diagnosis not present

## 2022-05-27 DIAGNOSIS — I472 Ventricular tachycardia, unspecified: Secondary | ICD-10-CM | POA: Diagnosis not present

## 2022-05-27 DIAGNOSIS — I469 Cardiac arrest, cause unspecified: Secondary | ICD-10-CM | POA: Diagnosis not present

## 2022-05-28 ENCOUNTER — Ambulatory Visit (HOSPITAL_COMMUNITY): Payer: Medicare PPO | Attending: Pulmonary Disease

## 2022-05-31 DIAGNOSIS — R5381 Other malaise: Secondary | ICD-10-CM | POA: Diagnosis not present

## 2022-05-31 DIAGNOSIS — I469 Cardiac arrest, cause unspecified: Secondary | ICD-10-CM | POA: Diagnosis not present

## 2022-05-31 DIAGNOSIS — G9340 Encephalopathy, unspecified: Secondary | ICD-10-CM | POA: Diagnosis not present

## 2022-05-31 DIAGNOSIS — J45909 Unspecified asthma, uncomplicated: Secondary | ICD-10-CM | POA: Diagnosis not present

## 2022-06-01 ENCOUNTER — Other Ambulatory Visit (HOSPITAL_COMMUNITY): Payer: Self-pay

## 2022-06-02 ENCOUNTER — Other Ambulatory Visit (HOSPITAL_COMMUNITY): Payer: Self-pay

## 2022-06-02 DIAGNOSIS — J189 Pneumonia, unspecified organism: Secondary | ICD-10-CM | POA: Diagnosis not present

## 2022-06-02 DIAGNOSIS — I472 Ventricular tachycardia, unspecified: Secondary | ICD-10-CM | POA: Diagnosis not present

## 2022-06-02 DIAGNOSIS — J45909 Unspecified asthma, uncomplicated: Secondary | ICD-10-CM | POA: Diagnosis not present

## 2022-06-02 DIAGNOSIS — E119 Type 2 diabetes mellitus without complications: Secondary | ICD-10-CM | POA: Diagnosis not present

## 2022-06-02 DIAGNOSIS — I4891 Unspecified atrial fibrillation: Secondary | ICD-10-CM | POA: Diagnosis not present

## 2022-06-02 DIAGNOSIS — I469 Cardiac arrest, cause unspecified: Secondary | ICD-10-CM | POA: Diagnosis not present

## 2022-06-02 DIAGNOSIS — I10 Essential (primary) hypertension: Secondary | ICD-10-CM | POA: Diagnosis not present

## 2022-06-02 DIAGNOSIS — N179 Acute kidney failure, unspecified: Secondary | ICD-10-CM | POA: Diagnosis not present

## 2022-06-02 DIAGNOSIS — J9601 Acute respiratory failure with hypoxia: Secondary | ICD-10-CM | POA: Diagnosis not present

## 2022-06-03 ENCOUNTER — Other Ambulatory Visit: Payer: Self-pay

## 2022-06-03 ENCOUNTER — Other Ambulatory Visit (HOSPITAL_COMMUNITY): Payer: Self-pay

## 2022-06-04 ENCOUNTER — Other Ambulatory Visit (HOSPITAL_COMMUNITY): Payer: Self-pay

## 2022-06-06 DIAGNOSIS — I4891 Unspecified atrial fibrillation: Secondary | ICD-10-CM | POA: Diagnosis not present

## 2022-06-06 DIAGNOSIS — J9601 Acute respiratory failure with hypoxia: Secondary | ICD-10-CM | POA: Diagnosis not present

## 2022-06-06 DIAGNOSIS — E119 Type 2 diabetes mellitus without complications: Secondary | ICD-10-CM | POA: Diagnosis not present

## 2022-06-06 DIAGNOSIS — I10 Essential (primary) hypertension: Secondary | ICD-10-CM | POA: Diagnosis not present

## 2022-06-06 DIAGNOSIS — J189 Pneumonia, unspecified organism: Secondary | ICD-10-CM | POA: Diagnosis not present

## 2022-06-06 DIAGNOSIS — N179 Acute kidney failure, unspecified: Secondary | ICD-10-CM | POA: Diagnosis not present

## 2022-06-06 DIAGNOSIS — I472 Ventricular tachycardia, unspecified: Secondary | ICD-10-CM | POA: Diagnosis not present

## 2022-06-06 DIAGNOSIS — J45909 Unspecified asthma, uncomplicated: Secondary | ICD-10-CM | POA: Diagnosis not present

## 2022-06-06 DIAGNOSIS — I469 Cardiac arrest, cause unspecified: Secondary | ICD-10-CM | POA: Diagnosis not present

## 2022-06-07 ENCOUNTER — Other Ambulatory Visit (HOSPITAL_COMMUNITY): Payer: Self-pay

## 2022-06-08 ENCOUNTER — Other Ambulatory Visit (HOSPITAL_COMMUNITY): Payer: Self-pay

## 2022-06-08 DIAGNOSIS — J189 Pneumonia, unspecified organism: Secondary | ICD-10-CM | POA: Diagnosis not present

## 2022-06-08 DIAGNOSIS — I10 Essential (primary) hypertension: Secondary | ICD-10-CM | POA: Diagnosis not present

## 2022-06-08 DIAGNOSIS — I472 Ventricular tachycardia, unspecified: Secondary | ICD-10-CM | POA: Diagnosis not present

## 2022-06-08 DIAGNOSIS — I469 Cardiac arrest, cause unspecified: Secondary | ICD-10-CM | POA: Diagnosis not present

## 2022-06-08 DIAGNOSIS — I4891 Unspecified atrial fibrillation: Secondary | ICD-10-CM | POA: Diagnosis not present

## 2022-06-08 DIAGNOSIS — E119 Type 2 diabetes mellitus without complications: Secondary | ICD-10-CM | POA: Diagnosis not present

## 2022-06-08 DIAGNOSIS — N179 Acute kidney failure, unspecified: Secondary | ICD-10-CM | POA: Diagnosis not present

## 2022-06-08 DIAGNOSIS — J9601 Acute respiratory failure with hypoxia: Secondary | ICD-10-CM | POA: Diagnosis not present

## 2022-06-08 DIAGNOSIS — J45909 Unspecified asthma, uncomplicated: Secondary | ICD-10-CM | POA: Diagnosis not present

## 2022-06-09 ENCOUNTER — Other Ambulatory Visit: Payer: Self-pay

## 2022-06-10 ENCOUNTER — Other Ambulatory Visit (HOSPITAL_COMMUNITY): Payer: Self-pay

## 2022-06-10 ENCOUNTER — Other Ambulatory Visit: Payer: Self-pay

## 2022-06-10 MED ORDER — MAGNESIUM OXIDE 400 MG PO TABS: 400.0000 mg | ORAL_TABLET | Freq: Every day | ORAL | 3 refills | Status: AC

## 2022-06-11 ENCOUNTER — Other Ambulatory Visit (HOSPITAL_COMMUNITY): Payer: Self-pay

## 2022-06-14 ENCOUNTER — Other Ambulatory Visit (HOSPITAL_COMMUNITY): Payer: Self-pay

## 2022-06-15 ENCOUNTER — Other Ambulatory Visit: Payer: Self-pay

## 2022-06-15 ENCOUNTER — Other Ambulatory Visit (HOSPITAL_COMMUNITY): Payer: Self-pay

## 2022-06-15 DIAGNOSIS — J9601 Acute respiratory failure with hypoxia: Secondary | ICD-10-CM | POA: Diagnosis not present

## 2022-06-15 DIAGNOSIS — J189 Pneumonia, unspecified organism: Secondary | ICD-10-CM | POA: Diagnosis not present

## 2022-06-15 DIAGNOSIS — I10 Essential (primary) hypertension: Secondary | ICD-10-CM | POA: Diagnosis not present

## 2022-06-15 DIAGNOSIS — E119 Type 2 diabetes mellitus without complications: Secondary | ICD-10-CM | POA: Diagnosis not present

## 2022-06-15 DIAGNOSIS — J45909 Unspecified asthma, uncomplicated: Secondary | ICD-10-CM | POA: Diagnosis not present

## 2022-06-15 DIAGNOSIS — I469 Cardiac arrest, cause unspecified: Secondary | ICD-10-CM | POA: Diagnosis not present

## 2022-06-15 DIAGNOSIS — N179 Acute kidney failure, unspecified: Secondary | ICD-10-CM | POA: Diagnosis not present

## 2022-06-15 DIAGNOSIS — I472 Ventricular tachycardia, unspecified: Secondary | ICD-10-CM | POA: Diagnosis not present

## 2022-06-15 DIAGNOSIS — I4891 Unspecified atrial fibrillation: Secondary | ICD-10-CM | POA: Diagnosis not present

## 2022-06-15 MED FILL — Medication: Qty: 1 | Status: AC

## 2022-06-21 ENCOUNTER — Ambulatory Visit: Payer: Medicare PPO | Admitting: Pulmonary Disease

## 2022-06-29 DIAGNOSIS — G9340 Encephalopathy, unspecified: Secondary | ICD-10-CM | POA: Diagnosis not present

## 2022-06-29 DIAGNOSIS — R5381 Other malaise: Secondary | ICD-10-CM | POA: Diagnosis not present

## 2022-06-29 DIAGNOSIS — J45909 Unspecified asthma, uncomplicated: Secondary | ICD-10-CM | POA: Diagnosis not present

## 2022-06-29 DIAGNOSIS — I469 Cardiac arrest, cause unspecified: Secondary | ICD-10-CM | POA: Diagnosis not present

## 2022-07-02 ENCOUNTER — Other Ambulatory Visit (HOSPITAL_COMMUNITY): Payer: Self-pay

## 2022-07-06 ENCOUNTER — Ambulatory Visit (HOSPITAL_BASED_OUTPATIENT_CLINIC_OR_DEPARTMENT_OTHER)
Admission: RE | Admit: 2022-07-06 | Discharge: 2022-07-06 | Disposition: A | Discharge status: Home / Self Care | Payer: Medicare PPO | Source: Ambulatory Visit | Attending: Pulmonary Disease | Admitting: Pulmonary Disease

## 2022-07-06 ENCOUNTER — Ambulatory Visit (HOSPITAL_COMMUNITY)
Admission: RE | Admit: 2022-07-06 | Discharge: 2022-07-06 | Disposition: A | Discharge status: Home / Self Care | Payer: Medicare PPO | Source: Ambulatory Visit | Attending: Pulmonary Disease | Admitting: Pulmonary Disease

## 2022-07-06 DIAGNOSIS — I272 Pulmonary hypertension, unspecified: Secondary | ICD-10-CM | POA: Insufficient documentation

## 2022-07-06 DIAGNOSIS — I270 Primary pulmonary hypertension: Secondary | ICD-10-CM

## 2022-07-06 DIAGNOSIS — J9811 Atelectasis: Secondary | ICD-10-CM | POA: Diagnosis not present

## 2022-07-06 DIAGNOSIS — I700 Atherosclerosis of aorta: Secondary | ICD-10-CM | POA: Diagnosis not present

## 2022-07-06 DIAGNOSIS — J9690 Respiratory failure, unspecified, unspecified whether with hypoxia or hypercapnia: Secondary | ICD-10-CM | POA: Diagnosis not present

## 2022-07-06 DIAGNOSIS — J189 Pneumonia, unspecified organism: Secondary | ICD-10-CM | POA: Diagnosis not present

## 2022-07-06 DIAGNOSIS — I358 Other nonrheumatic aortic valve disorders: Secondary | ICD-10-CM | POA: Insufficient documentation

## 2022-07-06 DIAGNOSIS — J9601 Acute respiratory failure with hypoxia: Secondary | ICD-10-CM | POA: Diagnosis not present

## 2022-07-06 LAB — ECHOCARDIOGRAM COMPLETE
AR max vel: 2.64 cm2
AV Area VTI: 2.79 cm2
AV Area mean vel: 2.81 cm2
AV Mean grad: 3 mmHg
AV Peak grad: 5.8 mmHg
Ao pk vel: 1.2 m/s
Area-P 1/2: 4.15 cm2
MV VTI: 2.89 cm2
S' Lateral: 2.2 cm

## 2022-07-06 NOTE — Progress Notes (Signed)
*  PRELIMINARY RESULTS* Echocardiogram 2D Echocardiogram has been performed.  Deborah Neal 07/06/2022, 12:16 PM

## 2022-07-08 ENCOUNTER — Encounter: Payer: Self-pay | Admitting: Pulmonary Disease

## 2022-07-08 ENCOUNTER — Ambulatory Visit: Payer: Medicare PPO | Admitting: Pulmonary Disease

## 2022-07-08 VITALS — BP 120/72 | HR 96 | Ht 61.0 in | Wt 154.0 lb

## 2022-07-08 DIAGNOSIS — J181 Lobar pneumonia, unspecified organism: Secondary | ICD-10-CM | POA: Diagnosis not present

## 2022-07-08 DIAGNOSIS — R0683 Snoring: Secondary | ICD-10-CM

## 2022-07-08 DIAGNOSIS — J9601 Acute respiratory failure with hypoxia: Secondary | ICD-10-CM

## 2022-07-08 NOTE — Progress Notes (Signed)
Deborah Neal    [id number]    [birth year]  Primary Care Physician:John, Hunt Oris, MD  Referring Physician: Biagio Borg, MD [address] [place],  [place] [address]  Chief complaint:   Patient being seen for follow-up recent hospitalization She has been doing relatively well  HPI:  Has not been having any significant problems with her breathing Recently joined a gym  Recent history significant for cardiac arrest December 2023 Was not having any breathing problems or acute issues prior to event she remembers feeling well prior to event it was when she was not able to be contacted that family alerted EMS She was later found unresponsive with some emesis around her Noted to about a seizure by EMS, had to have CPR, lasted less than 5 minutes  Transported to the hospital, intubated  Had V-fib V. tach arrest in the setting of severe hypokalemia in the hospital It was presumed that initial event may have been related to massive pulmonary embolism prior to hospitalization, did receive TNKase Lower extremity ultrasound during the hospitalization was negative for DVT, echo was suggestive of clot in transit CT PE protocol with PE with very small thrombus burden on 12/28 Was treated for Serratia pneumonia with bilateral infiltrates  Pulmonary pressures was elevated during hospitalization-may have been due to acute illness  Extubated after about 8 days  Does not recollect having significant problems prior to event  She has continued to improve Able to tolerate activities of daily living  She quit smoking about 20 years ago, smoked about 1 pack a day   Outpatient Encounter Medications as of 07/08/2022  Medication Sig   Accu-Chek Softclix Lancets lancets Use as directed up to four times daily as directed   acetaminophen (TYLENOL) 325 MG tablet Take 1-2 tablets (325-650 mg total) by mouth every 4 (four) hours as needed for mild pain.   amLODipine (NORVASC) 2.5 MG tablet  Take 3 tablets (7.5 mg total) by mouth daily.   apixaban (ELIQUIS) 5 MG TABS tablet Take 1 tablet (5 mg total) by mouth 2 (two) times daily.   blood glucose meter kit and supplies Use up to four times daily as directed.   furosemide (LASIX) 20 MG tablet Take 1 tablet (20 mg total) by mouth daily.   glucose blood test strip Use as directed up to four times daily as directed   HYDROcodone-acetaminophen (NORCO/VICODIN) 5-325 MG tablet Take 1 tablet by mouth every 4 (four) hours as needed for moderate pain.   insulin glargine (LANTUS) 100 UNIT/ML Solostar Pen Inject 16 Units into the skin 2 (two) times daily.   Insulin Pen Needle (CAREFINE PEN NEEDLES) 32G X 4 MM MISC Use to inject insulin 4 times daily.   levothyroxine (SYNTHROID) 75 MCG tablet Take 1 tablet (75 mcg total) by mouth daily at 6 (six) AM.   lidocaine (LIDODERM) 5 % Place 3 patches onto the skin daily. Remove & Discard patch within 12 hours or as directed by MD   losartan (COZAAR) 25 MG tablet Take 1 tablet (25 mg total) by mouth daily.   magnesium oxide (MAG-OX) 400 MG tablet Take 1 tablet (400 mg total) by mouth daily.   Multiple Vitamin (MULTIVITAMIN WITH MINERALS) TABS tablet Take 1 tablet by mouth daily.   potassium chloride SA (KLOR-CON M) 20 MEQ tablet Take 1 tablet (20 mEq total) by mouth daily.   rosuvastatin (CRESTOR) 20 MG tablet Take 1 tablet (20 mg total) by mouth  daily.   No facility-administered encounter medications on file as of 07/08/2022.    Allergies as of 07/08/2022 - Review Complete 07/08/2022  Allergen Reaction Noted   Shingrix [zoster vac recomb adjuvanted]  01/22/2022    Past Medical History:  Diagnosis Date   Acute massive pulmonary embolism ([place])    ALLERGIC RHINITIS 01/29/2007   Arthritis    ASTHMA 01/29/2007   ASTHMA, WITH ACUTE EXACERBATION 06/24/2008   BACK PAIN 01/29/2009   Cardiac arrest [facility])    CHEST PAIN 06/24/2008   Family history of adverse reaction to anesthesia    Patients daughter  has N/V after anesthesia   GERD (gastroesophageal reflux disease)    History of shingles    HYPERLIPIDEMIA 01/29/2007   HYPERTENSION 01/29/2007   OSTEOPENIA 10/16/2007   Type II or unspecified type diabetes mellitus without mention of complication, uncontrolled 11/29/2013    Past Surgical History:  Procedure Laterality Date   CHOLECYSTECTOMY N/A 09/19/2015   Procedure: LAPAROSCOPIC CHOLECYSTECTOMY WITH INTRAOPERATIVE CHOLANGIOGRAM;  Surgeon: Jackolyn Confer, MD;  Location: [place];  Service: General;  Laterality: N/A;   COLONOSCOPY     TONSILLECTOMY  1956   VIDEO BRONCHOSCOPY Bilateral 03/28/2014   Procedure: VIDEO BRONCHOSCOPY WITHOUT FLUORO;  Surgeon: Tanda Rockers, MD;  [facility];  Service: Cardiopulmonary;  Laterality: Bilateral;    Family History  Problem Relation Age of Onset   Cancer Mother        ? type    Diabetes Father    Hypertension Father    Heart disease Father    Asthma Father    Asthma Daughter    Colon cancer Neg Hx    Esophageal cancer Neg Hx    Rectal cancer Neg Hx    Stomach cancer Neg Hx    Breast cancer Neg Hx     Social History   Socioeconomic History   Marital status: Single    Spouse name: Not on file   Number of children: Not on file   Years of education: Not on file   Highest education level: Not on file  Occupational History   Not on file  Tobacco Use   Smoking status: Former    Types: Cigarettes    Quit date: 09/07/2003    Years since quitting: 18.8   Smokeless tobacco: Never  Vaping Use   Vaping Use: Never used  Substance and Sexual Activity   Alcohol use: No   Drug use: No   Sexual activity: Never  Other Topics Concern   Not on file  Social History Narrative   Not on file   Social Determinants of Health   Financial Resource Strain: Not on file  Food Insecurity: Not on file  Transportation Needs: Not on file  Physical Activity: Not on file  Stress: Not on file  Social Connections: Not on file  Intimate  Partner Violence: Not on file    Review of Systems  Respiratory:  Positive for shortness of breath.     Vitals:   07/08/22 1143  BP: 120/72  Pulse: 96  SpO2: 98%     Physical Exam Constitutional:      Appearance: She is obese.  HENT:     Head: Normocephalic.     Mouth/Throat:     Mouth: Mucous membranes are moist.  Cardiovascular:     Rate and Rhythm: Normal rate and regular rhythm.     Heart sounds: No murmur heard.    No friction rub.  Pulmonary:     Effort:  No respiratory distress.     Breath sounds: No stridor. No wheezing or rhonchi.  Musculoskeletal:     Cervical back: No rigidity or tenderness.  Neurological:     Mental Status: She is alert.  Psychiatric:        Mood and Affect: Mood normal.     Data Reviewed: Recent discharge records reviewed  CT scan was reviewed with the patient showing significant improvement in infiltrative process in the upper lobes bilaterally  Assessment:  Recent cardiorespiratory arrest  History of diabetes  Multilobar pneumonia -Resolving  Pulmonary hypertension on recent echocardiogram -Most recent echocardiogram showing normal pressures  History of pulmonary embolism  Plan/Recommendations:  Graded exercise as tolerated  Recently joined a gym  Encouraged to call us with significant concerns  CT with improvement  Echo with resolution pulmonary hypertension  Follow-up in 4 to 5 months    Sherrilyn Rist MD [facility] 07/08/2022, 11:47 AM  CC: Biagio Borg, MD

## 2022-07-08 NOTE — Patient Instructions (Addendum)
I will see you in about 4 to 5 months  Continue using your albuterol as needed  Exercising regularly will help greatly  Call us with significant concerns

## 2022-07-26 ENCOUNTER — Ambulatory Visit (HOSPITAL_COMMUNITY): Payer: Medicare PPO | Attending: Physician Assistant

## 2022-07-26 ENCOUNTER — Encounter (HOSPITAL_COMMUNITY): Payer: Self-pay | Admitting: Physician Assistant

## 2022-07-30 ENCOUNTER — Other Ambulatory Visit (HOSPITAL_COMMUNITY): Payer: Self-pay

## 2022-07-30 DIAGNOSIS — J45909 Unspecified asthma, uncomplicated: Secondary | ICD-10-CM | POA: Diagnosis not present

## 2022-07-30 DIAGNOSIS — I469 Cardiac arrest, cause unspecified: Secondary | ICD-10-CM | POA: Diagnosis not present

## 2022-07-30 DIAGNOSIS — R5381 Other malaise: Secondary | ICD-10-CM | POA: Diagnosis not present

## 2022-07-30 DIAGNOSIS — G9340 Encephalopathy, unspecified: Secondary | ICD-10-CM | POA: Diagnosis not present

## 2022-07-30 MED ORDER — LISDEXAMFETAMINE DIMESYLATE 70 MG PO CAPS
70.0000 mg | ORAL_CAPSULE | Freq: Every day | ORAL | 0 refills | Status: DC
  Filled 2022-07-30: qty 30, 30d supply, fill #0

## 2022-08-23 ENCOUNTER — Encounter: Payer: Self-pay | Admitting: Physician Assistant

## 2022-08-23 NOTE — Progress Notes (Deleted)
Cardiology Office Note    Date:  08/23/2022   ID:  Deborah Neal 12/29/1950, MRN [id number]  PCP:  Corwin Levins, MD  Cardiologist:  Verne Carrow, MD  Electrophysiologist:  None   Chief Complaint: ***  History of Present Illness:   Deborah Neal is a 72 y.o. female with history of HTN, HLD, uncontrolled DM2, asthma, coronary/aortic atherosclerosis by noncoronary CT, cardiac arrest 03/2022 due to massive PE with complex hospitalization seen for follow-up.    She was recently admitted 03/2022 with complex hospital course. She was in her USOH until approximately 5:30am when last seen by granddaughter day of admission. Family attempted to call her around 7am and she did not answer. When they went to check on her around 10am, she was unresponsive in bed with red emesis around her. Upon fire arrival, she was unresponsive but did have pulse/respirations though began having seizure activity. Upon EMS arrival she lost pulses and CPR was initiated, <5 minutes. She had seizure like activity en route requiring benzodiazepine and was intubated. Presenting EKG showed sinus tach with prolonged QT interval, nonspecific STTW changes, no STEMI. 2D echo showed mobile echodensity in the RV outflow tract concerning for clot in transit/PE, otherwise with normal EF 65-70%, G1DD, mild LVH, mildly reduced RVSF, mildly elevated PASP. Initial Cr 2.06, glucose 1194, K 3.3, lactate >9, WBC 21k, troponins 97->1355->1133 (A1c >15.5). CTA chest not able to be pursued due to renal dysfunction. She was initially started on heparin due to concern for massive PE with low threshold for lytics for decompensation. That evening she dropped her BP to the 40s requiring Levophed then went into cardiac arrest with VT/VF per notes, ROSC achieved after 2 minutes. She was subsequently treated with TNKase. LE [place] was negative for DVT. Other hospital issues included acute hypoxic respiratory failure/multifocal PNA with serratia  marcescens, aspiration PNA, bilateral pleural effusions, AKI with creatinine peak of 5.64 requiring nephrology involvement for diuretics with diffuse body wall edema, hypoalbuminemia, anemia, hematuria, concern for obstruction of LUL bronchus (to be followed in future with CT versus PET), sacral pressure injury, hypernatremia, hypokalemia, prolonged QT interval, and DKA. Repeat limited echo 04/23/22 to re-eval pericardial effusion showed EF 60-65%, moderate pulm HTN, no pericardial effusion. Cardiology was asked to see her for her cardiac arrest, felt due to the massive PE. No further cardiac workup was felt necessary as troponin elevation was felt due to the pulmonary embolism. Repeat echo 07/06/22 showed EF 60-65%, G1DD, mild LVH, aortic sclerosis without stenosis, normal RV, normal PASP. Pulm repeated CT and saw near complete resolution of previously identified bilateral airspace disease, + atx, coronary/aortic atherosclerosis, and right nephrolithasis.   Get duplicate out of system echo CMET? Plm htn, periceff not seen Cor calcification?  Cardiac arrest with elevated troponin, coronary calcification, aortic atherosclerosis Moderate pulmonary HTN Pericardial effusion QT prolongation  Labwork independently reviewed: 04/2022 TFTs wnl, Hgb 10.8 (improved), plt 312, Mg 2.1, K 4.3, Cr 1.01, calcium 10.4, alk phos 140 04/2022 LDL 129, trig 143, albumin 2.3, AST 29, ALT 50, A1c >15.5   Past History   Past Medical History:  Diagnosis Date   Acute massive pulmonary embolism ([facility])    ALLERGIC RHINITIS 01/29/2007   Arthritis    ASTHMA 01/29/2007   ASTHMA, WITH ACUTE EXACERBATION 06/24/2008   BACK PAIN 01/29/2009   Cardiac arrest [facility])    CHEST PAIN 06/24/2008   Family history of adverse reaction to anesthesia    Patients daughter has N/V after anesthesia  GERD (gastroesophageal reflux disease)    History of shingles    HYPERLIPIDEMIA 01/29/2007   HYPERTENSION 01/29/2007   OSTEOPENIA  10/16/2007   Type II or unspecified type diabetes mellitus without mention of complication, uncontrolled 11/29/2013    Past Surgical History:  Procedure Laterality Date   CHOLECYSTECTOMY N/A 09/19/2015   Procedure: LAPAROSCOPIC CHOLECYSTECTOMY WITH INTRAOPERATIVE CHOLANGIOGRAM;  Surgeon: Avel Peace, MD;  Location: [facility] OR;  Service: General;  Laterality: N/A;   COLONOSCOPY     TONSILLECTOMY  1956   VIDEO BRONCHOSCOPY Bilateral 03/28/2014   Procedure: VIDEO BRONCHOSCOPY WITHOUT FLUORO;  Surgeon: Nyoka Cowden, MD;  [facility];  Service: Cardiopulmonary;  Laterality: Bilateral;    Current Medications: No outpatient medications have been marked as taking for the 08/24/22 encounter (Appointment) with Laurann Montana, PA-C.   ***   Allergies:   Shingrix [zoster vac recomb adjuvanted]   Social History   Socioeconomic History   Marital status: Single    Spouse name: Not on file   Number of children: Not on file   Years of education: Not on file   Highest education level: Not on file  Occupational History   Not on file  Tobacco Use   Smoking status: Former    Types: Cigarettes    Quit date: 09/07/2003    Years since quitting: 18.9   Smokeless tobacco: Never  Vaping Use   Vaping Use: Never used  Substance and Sexual Activity   Alcohol use: No   Drug use: No   Sexual activity: Never  Other Topics Concern   Not on file  Social History Narrative   Not on file   Social Determinants of Health   Financial Resource Strain: Not on file  Food Insecurity: Not on file  Transportation Needs: Not on file  Physical Activity: Not on file  Stress: Not on file  Social Connections: Not on file     Family History:  The patient's ***family history includes Asthma in her daughter and father; Cancer in her mother; Diabetes in her father; Heart disease in her father; Hypertension in her father. There is no history of Colon cancer, Esophageal cancer, Rectal cancer, Stomach cancer,  or Breast cancer.  ROS:   Please see the history of present illness. Otherwise, review of systems is positive for ***.  All other systems are reviewed and otherwise negative.    EKG(s)/Additional Testing   EKG:  EKG is ordered today, personally reviewed, demonstrating ***  CV Studies: Cardiac studies reviewed are outlined and summarized above. Otherwise please see EMR for full report.  Recent Labs: 04/09/2022: B Natriuretic Peptide 153.2 05/21/2022: ALT 14; BUN 20; Creatinine, Ser 1.01; Hemoglobin 10.8; Magnesium 2.1; Platelets 312; Potassium 4.3; Sodium 142; TSH 1.430  Recent Lipid Panel    Component Value Date/Time   CHOL 212 (H) 04/24/2022 0937   TRIG 143 04/24/2022 0937   HDL 54 04/24/2022 0937   CHOLHDL 3.9 04/24/2022 0937   VLDL 29 04/24/2022 0937   LDLCALC 129 (H) 04/24/2022 0937   LDLCALC 50 01/09/2020 1404   LDLDIRECT 153.4 01/29/2009 1412    PHYSICAL EXAM:    VS:  There were no vitals taken for this visit.  BMI: There is no height or weight on file to calculate BMI.  GEN: Well nourished, well developed female in no acute distress HEENT: normocephalic, atraumatic Neck: no JVD, carotid bruits, or masses Cardiac: ***RRR; no murmurs, rubs, or gallops, no edema  Respiratory:  clear to auscultation bilaterally, normal work of  breathing GI: soft, nontender, nondistended, + BS MS: no deformity or atrophy Skin: warm and dry, no rash Neuro:  Alert and Oriented x 3, Strength and sensation are intact, follows commands Psych: euthymic mood, full affect  Wt Readings from Last 3 Encounters:  07/08/22 154 lb (69.9 kg)  05/24/22 154 lb 9.6 oz (70.1 kg)  05/21/22 153 lb (69.4 kg)     ASSESSMENT & PLAN:   ***     Disposition: F/u with ***   Medication Adjustments/Labs and Tests Ordered: Current medicines are reviewed at length with the patient today.  Concerns regarding medicines are outlined above. Medication changes, Labs and Tests ordered today are summarized  above and listed in the Patient Instructions accessible in Encounters.   Signed, Laurann Montana, PA-C  08/23/2022 10:01 AM    [facility] Phone: [phone]; Fax: [phone]

## 2022-08-24 ENCOUNTER — Ambulatory Visit: Payer: Medicare PPO | Attending: Physician Assistant | Admitting: Physician Assistant

## 2022-08-24 DIAGNOSIS — I469 Cardiac arrest, cause unspecified: Secondary | ICD-10-CM

## 2022-08-24 DIAGNOSIS — I2510 Atherosclerotic heart disease of native coronary artery without angina pectoris: Secondary | ICD-10-CM

## 2022-08-24 DIAGNOSIS — I272 Pulmonary hypertension, unspecified: Secondary | ICD-10-CM

## 2022-08-24 DIAGNOSIS — R9431 Abnormal electrocardiogram [ECG] [EKG]: Secondary | ICD-10-CM

## 2022-08-24 DIAGNOSIS — I3139 Other pericardial effusion (noninflammatory): Secondary | ICD-10-CM

## 2022-08-24 DIAGNOSIS — I700 Atherosclerosis of aorta: Secondary | ICD-10-CM

## 2022-08-25 ENCOUNTER — Other Ambulatory Visit (HOSPITAL_COMMUNITY): Payer: Self-pay

## 2022-08-25 ENCOUNTER — Ambulatory Visit (HOSPITAL_BASED_OUTPATIENT_CLINIC_OR_DEPARTMENT_OTHER): Payer: Medicare PPO | Attending: Pulmonary Disease | Admitting: Pulmonary Disease

## 2022-08-25 DIAGNOSIS — R0683 Snoring: Secondary | ICD-10-CM | POA: Diagnosis not present

## 2022-08-29 DIAGNOSIS — R5381 Other malaise: Secondary | ICD-10-CM | POA: Diagnosis not present

## 2022-08-29 DIAGNOSIS — J45909 Unspecified asthma, uncomplicated: Secondary | ICD-10-CM | POA: Diagnosis not present

## 2022-08-29 DIAGNOSIS — I469 Cardiac arrest, cause unspecified: Secondary | ICD-10-CM | POA: Diagnosis not present

## 2022-08-29 DIAGNOSIS — G9340 Encephalopathy, unspecified: Secondary | ICD-10-CM | POA: Diagnosis not present

## 2022-08-30 ENCOUNTER — Other Ambulatory Visit (HOSPITAL_COMMUNITY): Payer: Self-pay

## 2022-09-03 ENCOUNTER — Other Ambulatory Visit: Payer: Medicare PPO

## 2022-09-03 ENCOUNTER — Encounter: Payer: Self-pay | Admitting: Internal Medicine

## 2022-09-03 ENCOUNTER — Ambulatory Visit (INDEPENDENT_AMBULATORY_CARE_PROVIDER_SITE_OTHER): Payer: Medicare PPO | Admitting: Internal Medicine

## 2022-09-03 VITALS — BP 120/78 | HR 88 | Temp 98.7°F | Ht 62.0 in | Wt 157.0 lb

## 2022-09-03 DIAGNOSIS — E7800 Pure hypercholesterolemia, unspecified: Secondary | ICD-10-CM

## 2022-09-03 DIAGNOSIS — E538 Deficiency of other specified B group vitamins: Secondary | ICD-10-CM

## 2022-09-03 DIAGNOSIS — Z794 Long term (current) use of insulin: Secondary | ICD-10-CM

## 2022-09-03 DIAGNOSIS — E119 Type 2 diabetes mellitus without complications: Secondary | ICD-10-CM

## 2022-09-03 DIAGNOSIS — E559 Vitamin D deficiency, unspecified: Secondary | ICD-10-CM

## 2022-09-03 DIAGNOSIS — J45909 Unspecified asthma, uncomplicated: Secondary | ICD-10-CM

## 2022-09-03 DIAGNOSIS — I10 Essential (primary) hypertension: Secondary | ICD-10-CM

## 2022-09-03 DIAGNOSIS — Z0001 Encounter for general adult medical examination with abnormal findings: Secondary | ICD-10-CM | POA: Diagnosis not present

## 2022-09-03 LAB — HEPATIC FUNCTION PANEL
ALT: 16 U/L (ref 0–35)
AST: 15 U/L (ref 0–37)
Albumin: 4.2 g/dL (ref 3.5–5.2)
Alkaline Phosphatase: 119 U/L — ABNORMAL HIGH (ref 39–117)
Bilirubin, Direct: 0.1 mg/dL (ref 0.0–0.3)
Total Bilirubin: 0.3 mg/dL (ref 0.2–1.2)
Total Protein: 7.3 g/dL (ref 6.0–8.3)

## 2022-09-03 LAB — LIPID PANEL
Cholesterol: 153 mg/dL (ref 0–200)
HDL: 51.2 mg/dL (ref >39.00)
LDL Cholesterol: 82 mg/dL (ref 0–99)
NonHDL: 101.66
Total CHOL/HDL Ratio: 3
Triglycerides: 98 mg/dL (ref 0.0–149.0)
VLDL: 19.6 mg/dL (ref 0.0–40.0)

## 2022-09-03 LAB — CBC WITH DIFFERENTIAL/PLATELET
Basophils Absolute: 0 10*3/uL (ref 0.0–0.1)
Basophils Relative: 0.4 % (ref 0.0–3.0)
Eosinophils Absolute: 0.1 10*3/uL (ref 0.0–0.7)
Eosinophils Relative: 2 % (ref 0.0–5.0)
HCT: 40.3 % (ref 36.0–46.0)
Hemoglobin: 12.8 g/dL (ref 12.0–15.0)
Lymphocytes Relative: 44.9 % (ref 12.0–46.0)
Lymphs Abs: 2.4 10*3/uL (ref 0.7–4.0)
MCHC: 31.7 g/dL (ref 30.0–36.0)
MCV: 71.1 fl — ABNORMAL LOW (ref 78.0–100.0)
Monocytes Absolute: 0.3 10*3/uL (ref 0.1–1.0)
Monocytes Relative: 4.8 % (ref 3.0–12.0)
Neutro Abs: 2.6 10*3/uL (ref 1.4–7.7)
Neutrophils Relative %: 47.9 % (ref 43.0–77.0)
Platelets: 223 10*3/uL (ref 150.0–400.0)
RBC: 5.67 Mil/uL — ABNORMAL HIGH (ref 3.87–5.11)
RDW: 16.1 % — ABNORMAL HIGH (ref 11.5–15.5)
WBC: 5.4 10*3/uL (ref 4.0–10.5)

## 2022-09-03 LAB — MICROALBUMIN / CREATININE URINE RATIO
Creatinine,U: 120.2 mg/dL
Microalb Creat Ratio: 1.6 mg/g (ref 0.0–30.0)
Microalb, Ur: 1.9 mg/dL (ref 0.0–1.9)

## 2022-09-03 LAB — BASIC METABOLIC PANEL
BUN: 19 mg/dL (ref 6–23)
CO2: 29 mEq/L (ref 19–32)
Calcium: 9.8 mg/dL (ref 8.4–10.5)
Chloride: 101 mEq/L (ref 96–112)
Creatinine, Ser: 0.96 mg/dL (ref 0.40–1.20)
GFR: 59.66 mL/min — ABNORMAL LOW (ref >60.00)
Glucose, Bld: 322 mg/dL — ABNORMAL HIGH (ref 70–99)
Potassium: 3.9 mEq/L (ref 3.5–5.1)
Sodium: 138 mEq/L (ref 135–145)

## 2022-09-03 LAB — URINALYSIS, ROUTINE W REFLEX MICROSCOPIC
Bilirubin Urine: NEGATIVE
Hgb urine dipstick: NEGATIVE
Ketones, ur: NEGATIVE
Leukocytes,Ua: NEGATIVE
Nitrite: NEGATIVE
Specific Gravity, Urine: 1.025 (ref 1.000–1.030)
Total Protein, Urine: NEGATIVE
Urine Glucose: >=1000 — AB
Urobilinogen, UA: 0.2 (ref 0.0–1.0)
pH: 5.5 (ref 5.0–8.0)

## 2022-09-03 LAB — VITAMIN B12: Vitamin B-12: 1007 pg/mL — ABNORMAL HIGH (ref 211–911)

## 2022-09-03 LAB — TSH: TSH: 0.75 u[IU]/mL (ref 0.35–5.50)

## 2022-09-03 LAB — VITAMIN D 25 HYDROXY (VIT D DEFICIENCY, FRACTURES): VITD: 37.67 ng/mL (ref 30.00–100.00)

## 2022-09-03 NOTE — Progress Notes (Signed)
The test results show that your current treatment is OK, as the tests are stable, except the sugar is 322.  Please continue to follow with your endocrinologist, and otherwise  Please continue the same plan.  There is no other need for change of treatment or further evaluation based on these results, at this time.  thanks

## 2022-09-03 NOTE — Progress Notes (Unsigned)
Patient ID: DEOVEON SPINOLA, female   DOB: July 26, 1950, 72 y.o.   MRN: [id number]         Chief Complaint:: wellness exam and dm, low vit d, htn, asthma       HPI:  Deborah Neal is a 72 y.o. female here for wellness exam; due for eye exam referral; for shingrix at pharmacy, declines colonoscopy, o/w  up to date                        Also Pt denies chest pain, increased sob or doe, wheezing, orthopnea, PND, increased LE swelling, palpitations, dizziness or syncope.   Pt denies polydipsia, polyuria, or new focal neuro s/s.    Pt denies fever, wt loss, night sweats, loss of appetite, or other constitutional symptoms     Wt Readings from Last 3 Encounters:  09/03/22 157 lb (71.2 kg)  08/25/22 155 lb (70.3 kg)  07/08/22 154 lb (69.9 kg)   BP Readings from Last 3 Encounters:  09/03/22 120/78  07/08/22 120/72  05/24/22 104/62   Immunization History  Administered Date(s) Administered   Fluad Quad(high Dose 65+) 02/16/2019, 01/09/2020, 03/10/2021, 01/22/2022   Influenza Split 03/02/2011   Influenza Whole 01/29/2009   Influenza, High Dose Seasonal PF 02/22/2017   Influenza,inj,Quad PF,6+ Mos 05/30/2013, 05/15/2014, 07/14/2015   Influenza-Unspecified 05/27/2016, 11/24/2017   Pneumococcal Conjugate-13 12/13/2017   Pneumococcal Polysaccharide-23 02/22/2017   Td 01/29/2009   Tdap 07/04/2019   Zoster, Live 09/19/2012   Health Maintenance Due  Topic Date Due   Medicare Annual Wellness (AWV)  Never done   Zoster Vaccines- Shingrix (1 of 2) Never done   OPHTHALMOLOGY EXAM  11/03/2021      Past Medical History:  Diagnosis Date   Acute massive pulmonary embolism (HCC)    ALLERGIC RHINITIS 01/29/2007   Aortic atherosclerosis (HCC)    Arthritis    ASTHMA 01/29/2007   ASTHMA, WITH ACUTE EXACERBATION 06/24/2008   BACK PAIN 01/29/2009   Cardiac arrest [facility])    CHEST PAIN 06/24/2008   Coronary artery calcification seen on CT scan    Family history of adverse reaction to anesthesia    Patients  daughter has N/V after anesthesia   GERD (gastroesophageal reflux disease)    History of shingles    HYPERLIPIDEMIA 01/29/2007   HYPERTENSION 01/29/2007   OSTEOPENIA 10/16/2007   Type II or unspecified type diabetes mellitus without mention of complication, uncontrolled 11/29/2013   Past Surgical History:  Procedure Laterality Date   CHOLECYSTECTOMY N/A 09/19/2015   Procedure: LAPAROSCOPIC CHOLECYSTECTOMY WITH INTRAOPERATIVE CHOLANGIOGRAM;  Surgeon: Avel Peace, MD;  Location: [facility] OR;  Service: General;  Laterality: N/A;   COLONOSCOPY     TONSILLECTOMY  1956   VIDEO BRONCHOSCOPY Bilateral 03/28/2014   Procedure: VIDEO BRONCHOSCOPY WITHOUT FLUORO;  Surgeon: Nyoka Cowden, MD;  Location: [facility] ENDOSCOPY;  Service: Cardiopulmonary;  Laterality: Bilateral;    reports that she quit smoking about 19 years ago. Her smoking use included cigarettes. She has never used smokeless tobacco. She reports that she does not drink alcohol and does not use drugs. family history includes Asthma in her daughter and father; Cancer in her mother; Diabetes in her father; Heart disease in her father; Hypertension in her father. Allergies  Allergen Reactions   Shingrix [Zoster Vac Recomb Adjuvanted]     Allergy to original live zoster - rash   Current Outpatient Medications on File Prior to Visit  Medication Sig Dispense Refill   Accu-Chek  Softclix Lancets lancets Use as directed up to four times daily as directed 100 each 0   acetaminophen (TYLENOL) 325 MG tablet Take 1-2 tablets (325-650 mg total) by mouth every 4 (four) hours as needed for mild pain.     amLODipine (NORVASC) 2.5 MG tablet Take 3 tablets (7.5 mg total) by mouth daily. 90 tablet 3   apixaban (ELIQUIS) 5 MG TABS tablet Take 1 tablet (5 mg total) by mouth 2 (two) times daily. 60 tablet 4   blood glucose meter kit and supplies Use up to four times daily as directed. 1 each 0   furosemide (LASIX) 20 MG tablet Take 1 tablet (20 mg total) by mouth  daily. 90 tablet 3   glucose blood test strip Use as directed up to four times daily as directed 400 each 3   HYDROcodone-acetaminophen (NORCO/VICODIN) 5-325 MG tablet Take 1 tablet by mouth every 4 (four) hours as needed for moderate pain. 20 tablet 0   insulin glargine (LANTUS) 100 UNIT/ML Solostar Pen Inject 16 Units into the skin 2 (two) times daily. 15 mL 11   Insulin Pen Needle (CAREFINE PEN NEEDLES) 32G X 4 MM MISC Use to inject insulin 4 times daily. 100 each 0   levothyroxine (SYNTHROID) 75 MCG tablet Take 1 tablet (75 mcg total) by mouth daily at 6 (six) AM. 90 tablet 3   lidocaine (LIDODERM) 5 % Place 3 patches onto the skin daily. Remove & Discard patch within 12 hours or as directed by MD 30 patch 0   losartan (COZAAR) 25 MG tablet Take 1 tablet (25 mg total) by mouth daily. 90 tablet 3   magnesium oxide (MAG-OX) 400 MG tablet Take 1 tablet (400 mg total) by mouth daily. 90 tablet 3   Multiple Vitamin (MULTIVITAMIN WITH MINERALS) TABS tablet Take 1 tablet by mouth daily.     potassium chloride SA (KLOR-CON M) 20 MEQ tablet Take 1 tablet (20 mEq total) by mouth daily. 90 tablet 3   rosuvastatin (CRESTOR) 20 MG tablet Take 1 tablet (20 mg total) by mouth daily. 90 tablet 3   [DISCONTINUED] lisdexamfetamine (VYVANSE) 70 MG capsule Take 1 capsule (70 mg total) by mouth daily. 30 capsule 0   No current facility-administered medications on file prior to visit.        ROS:  All others reviewed and negative.  Objective        PE:  BP 120/78 (BP Location: Right Arm, Patient Position: Sitting, Cuff Size: Normal)   Pulse 88   Temp 98.7 F (37.1 C) (Oral)   Ht 5\' 2"  (1.575 m)   Wt 157 lb (71.2 kg)   SpO2 98%   BMI 28.72 kg/m                 Constitutional: Pt appears in NAD               HENT: Head: NCAT.                Right Ear: External ear normal.                 Left Ear: External ear normal.                Eyes: . Pupils are equal, round, and reactive to light. Conjunctivae  and EOM are normal               Nose: without d/c or deformity  Neck: Neck supple. Gross normal ROM               Cardiovascular: Normal rate and regular rhythm.                 Pulmonary/Chest: Effort normal and breath sounds without rales or wheezing.                Abd:  Soft, NT, ND, + BS, no organomegaly               Neurological: Pt is alert. At baseline orientation, motor grossly intact               Skin: Skin is warm. No rashes, no other new lesions, LE edema - none               Psychiatric: Pt behavior is normal without agitation   Micro: none  Cardiac tracings I have personally interpreted today:  none  Pertinent Radiological findings (summarize): none   Lab Results  Component Value Date   WBC 5.4 09/03/2022   HGB 12.8 09/03/2022   HCT 40.3 09/03/2022   PLT 223.0 09/03/2022   GLUCOSE 322 (H) 09/03/2022   CHOL 153 09/03/2022   TRIG 98.0 09/03/2022   HDL 51.20 09/03/2022   LDLDIRECT 153.4 01/29/2009   LDLCALC 82 09/03/2022   ALT 16 09/03/2022   AST 15 09/03/2022   NA 138 09/03/2022   K 3.9 09/03/2022   CL 101 09/03/2022   CREATININE 0.96 09/03/2022   BUN 19 09/03/2022   CO2 29 09/03/2022   TSH 0.75 09/03/2022   INR 1.2 04/09/2022   HGBA1C >14.0 (H) 09/03/2022   MICROALBUR 1.9 09/03/2022   Assessment/Plan:  NYIMA KNAGGS is a 72 y.o. Black or African American [2] female with  has a past medical history of Acute massive pulmonary embolism (HCC), ALLERGIC RHINITIS (01/29/2007), Aortic atherosclerosis (HCC), Arthritis, ASTHMA (01/29/2007), ASTHMA, WITH ACUTE EXACERBATION (06/24/2008), BACK PAIN (01/29/2009), Cardiac arrest [facility]), CHEST PAIN (06/24/2008), Coronary artery calcification seen on CT scan, Family history of adverse reaction to anesthesia, GERD (gastroesophageal reflux disease), History of shingles, HYPERLIPIDEMIA (01/29/2007), HYPERTENSION (01/29/2007), OSTEOPENIA (10/16/2007), and Type II or unspecified type diabetes mellitus without mention  of complication, uncontrolled (11/29/2013).  Vitamin D deficiency Last vitamin D Lab Results  Component Value Date   VD25OH 31.51 07/22/2021   Low, to start oral replacement   Encounter for well adult exam with abnormal findings Age and sex appropriate education and counseling updated with regular exercise and diet Referrals for preventative services - for eye exam referral, declines colonocopy Immunizations addressed - for shingrix at pharmacy, Smoking counseling  - none needed Evidence for depression or other mood disorder - none significant Most recent labs reviewed. I have personally reviewed and have noted: 1) the patient's medical and social history 2) The patient's current medications and supplements 3) The patient's height, weight, and BMI have been recorded in the chart   Essential hypertension BP Readings from Last 3 Encounters:  09/03/22 120/78  07/08/22 120/72  05/24/22 104/62   Stable, pt to continue medical treatment norvasc 2.5 mg qd, losartan 25 mg qd   Asthma Stable, cont inhaler prn  Diabetes mellitus type II, non insulin dependent (HCC) Lab Results  Component Value Date   HGBA1C >14.0 (H) 09/03/2022   Chronic severe uncontrolled,  pt to continue current medical treatment lantus 16 u bid, and refer endo for better control   HLD (hyperlipidemia) Lab Results  Component Value Date  LDLCALC 82 09/03/2022   Uncontrolled,  pt to take crestor 20 mg qd with good compliance   Followup: Return in about 6 months (around 03/06/2023).  Oliver Barre, MD 09/08/2022 8:03 PM [facility] - [facility] - [facility]

## 2022-09-03 NOTE — Patient Instructions (Addendum)

## 2022-09-03 NOTE — Assessment & Plan Note (Signed)
Last vitamin D Lab Results  Component Value Date   VD25OH 31.51 07/22/2021   Low, to start oral replacement

## 2022-09-04 ENCOUNTER — Encounter: Payer: Self-pay | Admitting: Internal Medicine

## 2022-09-04 LAB — HEMOGLOBIN A1C: Hgb A1c MFr Bld: >14 % of total Hgb — ABNORMAL HIGH (ref <5.7)

## 2022-09-06 ENCOUNTER — Telehealth: Payer: Self-pay | Admitting: Pulmonary Disease

## 2022-09-06 NOTE — Telephone Encounter (Signed)
Call patient  Sleep study result  Date of study: 08/25/2022  Impression: Mild obstructive sleep apnea Mild oxygen desaturations  Recommendation: Options of treatment for mild obstructive sleep apnea will include  1.  CPAP therapy if there is significant daytime sleepiness or other comorbidities including history of CVA or cardiac disease  -If CPAP is chosen as an option of treatment auto titrating CPAP with a pressure setting of 5-15 will be appropriate  2.  Watchful waiting with emphasis on weight loss measures, sleep position modification to optimize lateral sleep, elevating the head of the bed by about 30 degrees may also help.  3.  An oral device may be fashioned for the treatment of mild sleep disordered breathing, will involve referral to dentist.  Follow-up as previously scheduled 

## 2022-09-06 NOTE — Procedures (Signed)
POLYSOMNOGRAPHY  Last, First: Deborah, Neal MRN: [id number] Gender: Female Age (years): 72 Weight (lbs): 155 DOB: 10-24-1950 BMI: 28 Primary Care: No PCP Epworth Score: 8 Referring: Tomma Lightning MD Technician: Ulyess Mort Interpreting: Tomma Lightning MD Study Type: NPSG Ordered Study Type: Split Night CPAP Study date: 08/25/2022 Location:  CLINICAL INFORMATION Deborah Neal is a 72 year old Female and was referred to the sleep center for evaluation of G47.33 OSA: Adult and Pediatric (327.23), G47.80 Other Sleep Disorders. Indications include Diabetes, Hypertension, Snoring.  MEDICATIONS Patient self administered medications include: N/A. Medications administered during study include No sleep medicine administered.  SLEEP STUDY TECHNIQUE A multi-channel overnight Polysomnography study was performed. The channels recorded and monitored were central and occipital EEG, electrooculogram (EOG), submentalis EMG (chin), nasal and oral airflow, thoracic and abdominal wall motion, anterior tibialis EMG, snore microphone, electrocardiogram, and a pulse oximetry. TECHNICIAN COMMENTS Comments added by Technician: PATIENT WAS ORDERED AS A SPLIT NIGHT STUDY Comments added by Scorer: N/A SLEEP ARCHITECTURE The study was initiated at 10:38:49 PM and terminated at 4:35:22 AM. The total recorded time was 356.6 minutes. EEG confirmed total sleep time was 304.5 minutes yielding a sleep efficiency of 85.4%. Sleep onset after lights out was 12.2 minutes with a REM latency of 208.5 minutes. The patient spent 23.5% of the night in stage N1 sleep, 65.0% in stage N2 sleep, 0.0% in stage N3 and 11.5% in REM. Wake after sleep onset (WASO) was 39.9 minutes. The Arousal Index was 34.7/hour. RESPIRATORY PARAMETERS There were a total of 35 respiratory disturbances out of which 0 were apneas ( 0 obstructive, 0 mixed, 0 central) and 35 hypopneas. The apnea/hypopnea index (AHI) was 6.9 events/hour. The  central sleep apnea index was 0 events/hour. The REM AHI was 1.7 events/hour and NREM AHI was 7.6 events/hour. The supine AHI was 6.5 events/hour and the non supine AHI was 6.9 supine during 9.12% of sleep. Respiratory disturbances were associated with oxygen desaturation down to a nadir of 87.0% during sleep. The mean oxygen saturation during the study was 92.1%.   LEG MOVEMENT DATA The total leg movements were 0 with a resulting leg movement index of 0.0/hr .Associated arousal with leg movement index was 0.0/hr.  CARDIAC DATA The underlying cardiac rhythm was most consistent with sinus rhythm. Mean heart rate during sleep was 56.5 bpm. Additional rhythm abnormalities include PVCs.  IMPRESSIONS - Mild Obstructive Sleep apnea(OSA) - Electrocardiographic data showed presence of PVCs. - Mild Oxygen Desaturation - The patient snored with soft snoring volume. - No significant periodic leg movements(PLMs) during sleep. However, no significant associated arousals.  DIAGNOSIS - Obstructive Sleep Apnea (G47.33) - Nocturnal Hypoxemia (G47.36)  RECOMMENDATIONS - Very mild obstructive sleep apnea. Return to discuss treatment options. - Auto CPAP may be considered as an option of treatment in the presence of significant daytime symptoms, auto CPAP 5-15 with heated humidification - An oral device may be considered as an option of treatment for mild sleep disordered breathing - Avoid alcohol, sedatives and other CNS depressants that may worsen sleep apnea and disrupt normal sleep architecture. - Sleep hygiene should be reviewed to assess factors that may improve sleep quality. - Weight management and regular exercise should be initiated or continued.  [Electronically signed] 09/06/2022 05:15 AM  Virl Diamond MD NPI: [id number]

## 2022-09-08 ENCOUNTER — Encounter: Payer: Self-pay | Admitting: Internal Medicine

## 2022-09-08 NOTE — Assessment & Plan Note (Signed)
BP Readings from Last 3 Encounters:  09/03/22 120/78  07/08/22 120/72  05/24/22 104/62   Stable, pt to continue medical treatment norvasc 2.5 mg qd, losartan 25 mg qd

## 2022-09-08 NOTE — Assessment & Plan Note (Signed)
Age and sex appropriate education and counseling updated with regular exercise and diet Referrals for preventative services - for eye exam referral, declines colonocopy Immunizations addressed - for shingrix at pharmacy, Smoking counseling  - none needed Evidence for depression or other mood disorder - none significant Most recent labs reviewed. I have personally reviewed and have noted: 1) the patient's medical and social history 2) The patient's current medications and supplements 3) The patient's height, weight, and BMI have been recorded in the chart

## 2022-09-08 NOTE — Assessment & Plan Note (Signed)
Lab Results  Component Value Date   LDLCALC 82 09/03/2022   Uncontrolled,  pt to take crestor 20 mg qd with good compliance

## 2022-09-08 NOTE — Assessment & Plan Note (Signed)
Lab Results  Component Value Date   HGBA1C >14.0 (H) 09/03/2022   Chronic severe uncontrolled,  pt to continue current medical treatment lantus 16 u bid, and refer endo for better control

## 2022-09-08 NOTE — Telephone Encounter (Signed)
ATC x1 was unable to leave VM on patient's phone mailbox was full .

## 2022-09-08 NOTE — Assessment & Plan Note (Signed)
Stable , cont inhaler prn 

## 2022-09-15 ENCOUNTER — Other Ambulatory Visit (HOSPITAL_COMMUNITY): Payer: Self-pay

## 2022-09-23 ENCOUNTER — Other Ambulatory Visit: Payer: Self-pay

## 2022-09-23 ENCOUNTER — Other Ambulatory Visit (HOSPITAL_COMMUNITY): Payer: Self-pay

## 2022-09-23 ENCOUNTER — Other Ambulatory Visit: Payer: Self-pay | Admitting: Internal Medicine

## 2022-09-23 MED ORDER — AMLODIPINE BESYLATE 2.5 MG PO TABS
7.5000 mg | ORAL_TABLET | Freq: Every day | ORAL | 3 refills | Status: DC
  Filled 2022-09-23 – 2022-10-06 (×2): qty 90, 30d supply, fill #0
  Filled 2022-11-03: qty 90, 30d supply, fill #1
  Filled 2022-12-03: qty 90, 30d supply, fill #2
  Filled 2023-01-03 – 2023-01-14 (×2): qty 90, 30d supply, fill #3

## 2022-09-29 DIAGNOSIS — R5381 Other malaise: Secondary | ICD-10-CM | POA: Diagnosis not present

## 2022-09-29 DIAGNOSIS — G9340 Encephalopathy, unspecified: Secondary | ICD-10-CM | POA: Diagnosis not present

## 2022-09-29 DIAGNOSIS — I469 Cardiac arrest, cause unspecified: Secondary | ICD-10-CM | POA: Diagnosis not present

## 2022-09-29 DIAGNOSIS — J45909 Unspecified asthma, uncomplicated: Secondary | ICD-10-CM | POA: Diagnosis not present

## 2022-09-30 ENCOUNTER — Other Ambulatory Visit: Payer: Self-pay | Admitting: Internal Medicine

## 2022-09-30 DIAGNOSIS — E119 Type 2 diabetes mellitus without complications: Secondary | ICD-10-CM

## 2022-10-05 ENCOUNTER — Other Ambulatory Visit (HOSPITAL_COMMUNITY): Payer: Self-pay

## 2022-10-06 ENCOUNTER — Other Ambulatory Visit: Payer: Self-pay

## 2022-10-18 NOTE — Telephone Encounter (Signed)
ATC x2 LVM for patient to call our office back regarding sleep study result's. 

## 2022-10-29 ENCOUNTER — Other Ambulatory Visit (HOSPITAL_COMMUNITY): Payer: Self-pay

## 2022-10-29 DIAGNOSIS — J45909 Unspecified asthma, uncomplicated: Secondary | ICD-10-CM | POA: Diagnosis not present

## 2022-10-29 DIAGNOSIS — R5381 Other malaise: Secondary | ICD-10-CM | POA: Diagnosis not present

## 2022-10-29 DIAGNOSIS — G9340 Encephalopathy, unspecified: Secondary | ICD-10-CM | POA: Diagnosis not present

## 2022-10-29 DIAGNOSIS — I469 Cardiac arrest, cause unspecified: Secondary | ICD-10-CM | POA: Diagnosis not present

## 2022-11-03 ENCOUNTER — Other Ambulatory Visit: Payer: Self-pay | Admitting: Internal Medicine

## 2022-11-03 ENCOUNTER — Other Ambulatory Visit: Payer: Self-pay

## 2022-11-03 ENCOUNTER — Other Ambulatory Visit (HOSPITAL_COMMUNITY): Payer: Self-pay

## 2022-11-03 MED ORDER — APIXABAN 5 MG PO TABS
5.0000 mg | ORAL_TABLET | Freq: Two times a day (BID) | ORAL | 4 refills | Status: DC
  Filled 2022-11-03: qty 60, 30d supply, fill #0
  Filled 2022-12-03: qty 60, 30d supply, fill #1
  Filled 2023-01-03 – 2023-01-14 (×2): qty 60, 30d supply, fill #2
  Filled 2023-02-14: qty 60, 30d supply, fill #3
  Filled 2023-03-14: qty 60, 30d supply, fill #4

## 2022-11-09 ENCOUNTER — Ambulatory Visit: Payer: Medicare PPO

## 2022-11-09 VITALS — Ht 62.0 in | Wt 159.0 lb

## 2022-11-09 DIAGNOSIS — Z1211 Encounter for screening for malignant neoplasm of colon: Secondary | ICD-10-CM | POA: Diagnosis not present

## 2022-11-09 DIAGNOSIS — Z13820 Encounter for screening for osteoporosis: Secondary | ICD-10-CM | POA: Diagnosis not present

## 2022-11-09 DIAGNOSIS — Z1239 Encounter for other screening for malignant neoplasm of breast: Secondary | ICD-10-CM

## 2022-11-09 DIAGNOSIS — Z0000 Encounter for general adult medical examination without abnormal findings: Secondary | ICD-10-CM | POA: Diagnosis not present

## 2022-11-09 DIAGNOSIS — Z135 Encounter for screening for eye and ear disorders: Secondary | ICD-10-CM | POA: Diagnosis not present

## 2022-11-09 IMAGING — CT CT HEAD W/O CM
3 series · 15 of 47 positions shown, 18 images · non-contrast
Comparison: CT head 02/21/2017

CLINICAL DATA: Delirium

EXAM:
CT HEAD WITHOUT CONTRAST
TECHNIQUE: Contiguous axial images were obtained from the base of the skull
through the vertex without intravenous contrast.

[Series 3: head wo · axial · 0.47mm/px · z∈[-125,-0]mm · 9 of 30 slices shown, 12 images]
[im 3/30  brain]
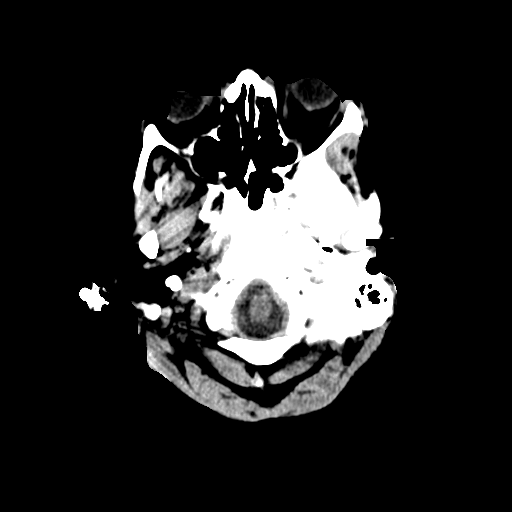
[im 3/30  bone]
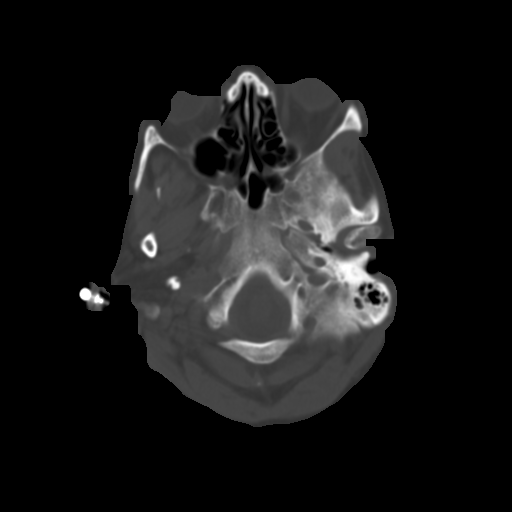
[im 6/30  brain]
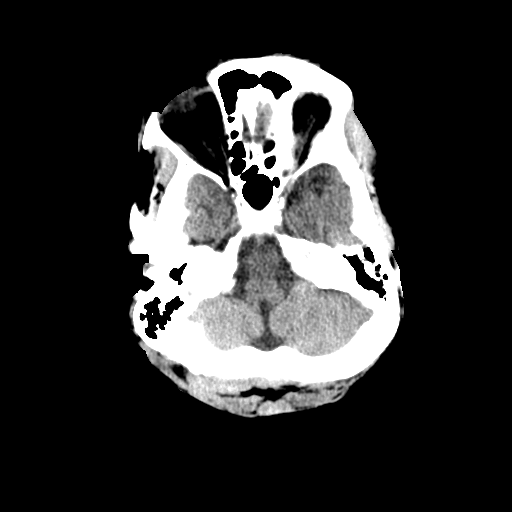
[im 9/30  brain]
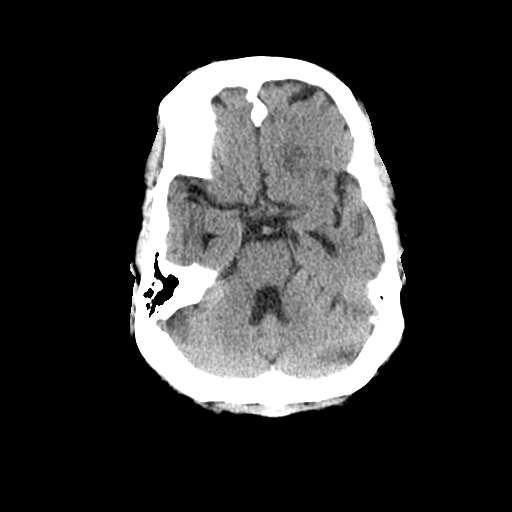
[im 12/30  brain]
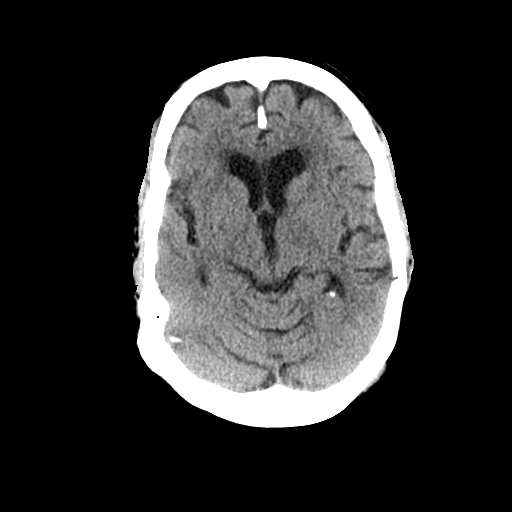
[im 16/30  brain]
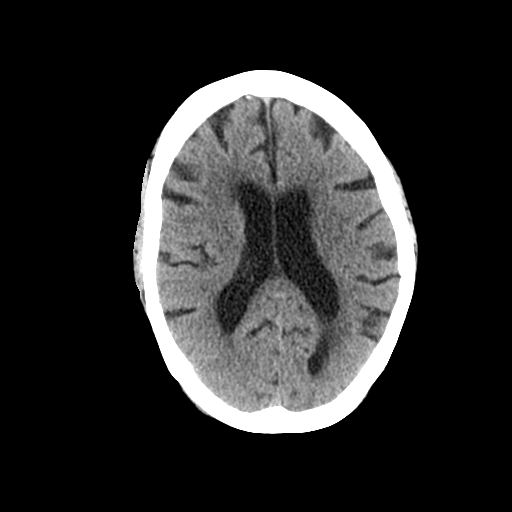
[im 16/30  bone]
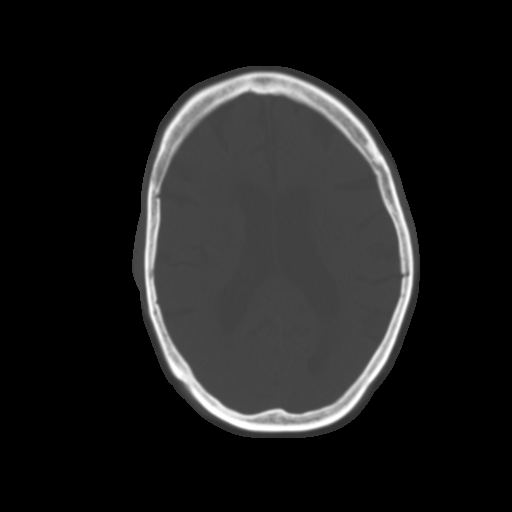
[im 19/30  brain]
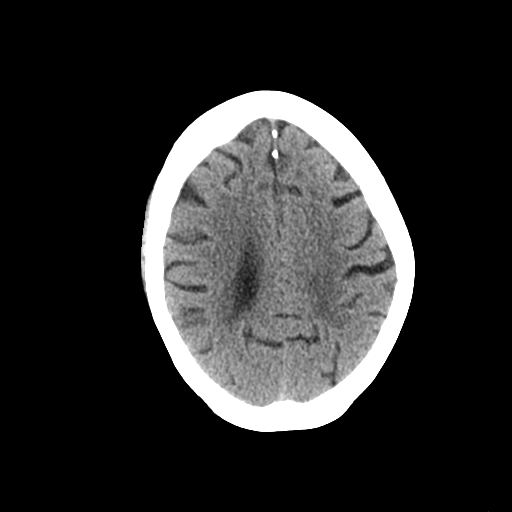
[im 22/30  brain]
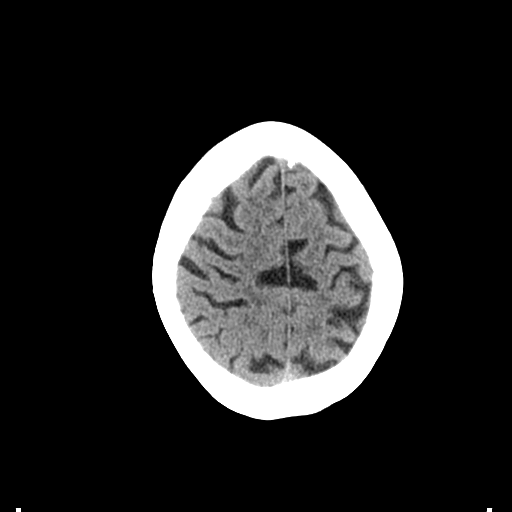
[im 25/30  brain]
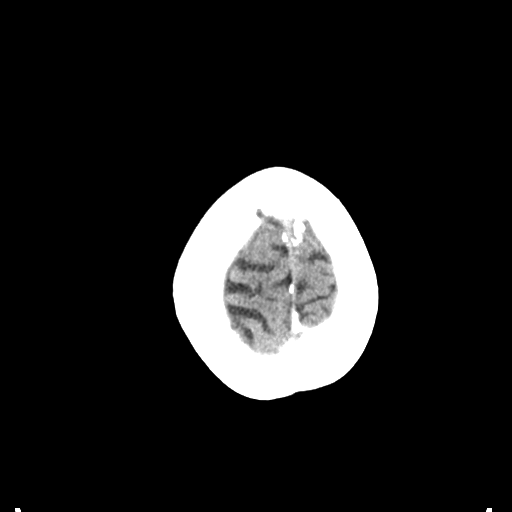
[im 28/30  brain]
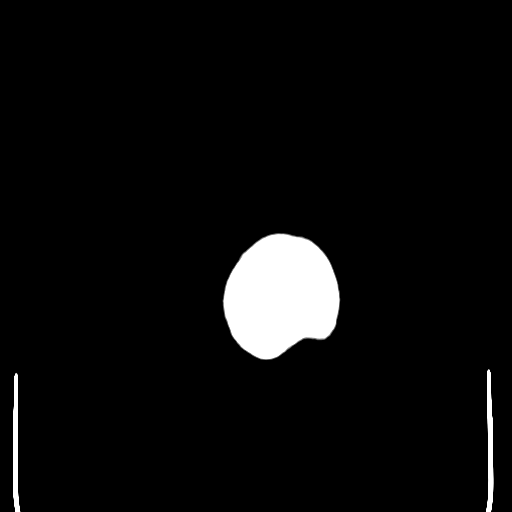
[im 28/30  bone]
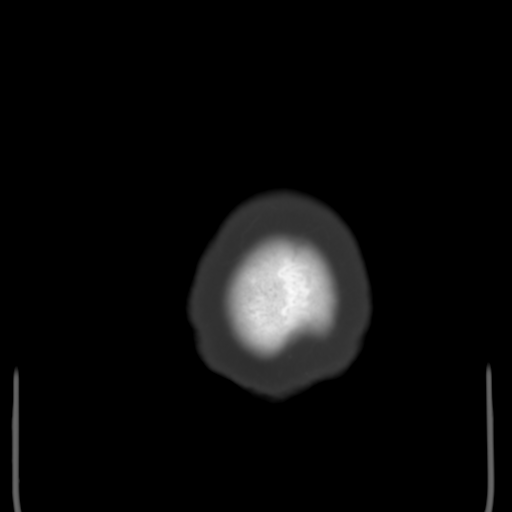

[Series 4: coronal soft tissue · coronal · 0.30mm/px · 3 of 68 slices shown]
[im 23/68  brain]
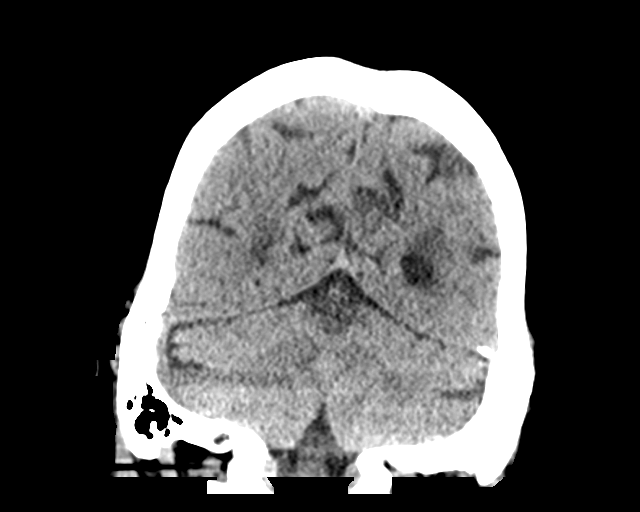
[im 30/68  brain]
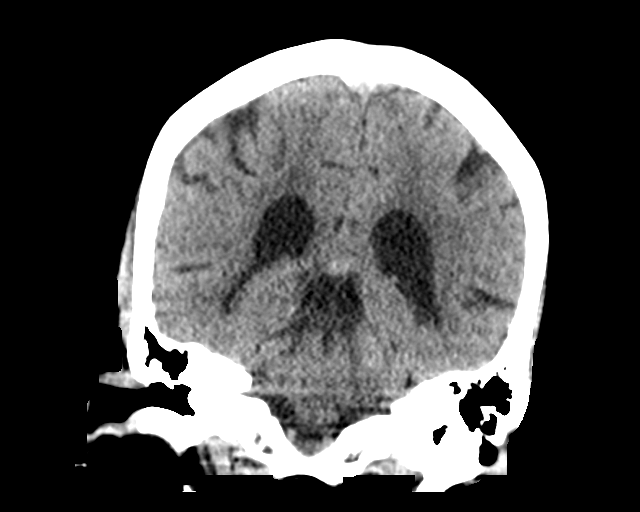
[im 38/68  brain]
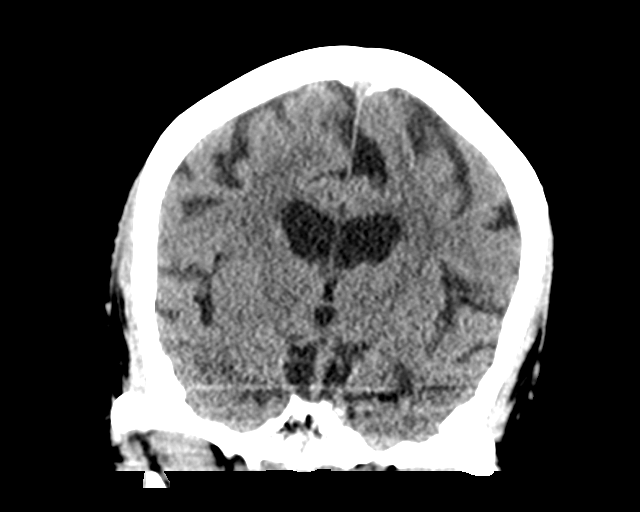

[Series 5: sagittal soft tissue · sagittal · 0.32mm/px · 3 of 62 slices shown]
[im 21/62  brain]
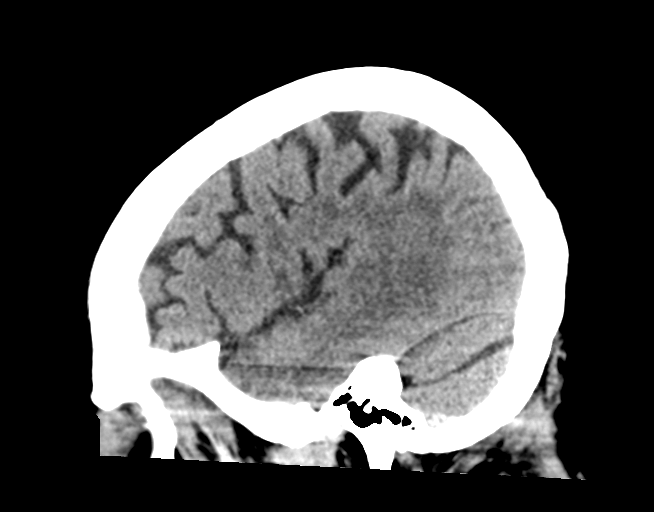
[im 31/62  brain]
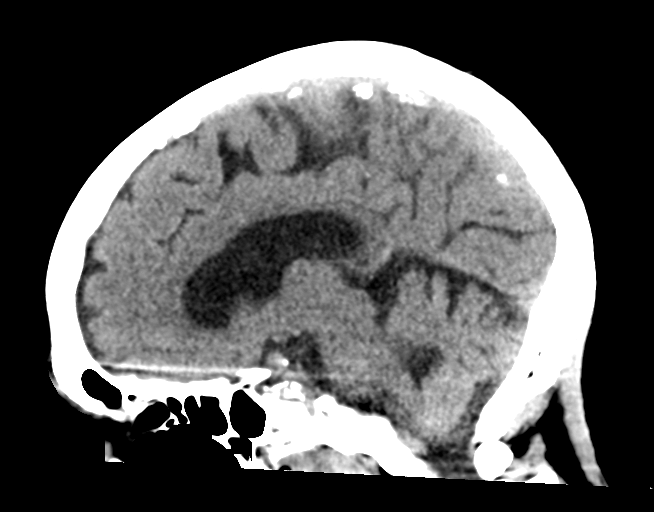
[im 41/62  brain]
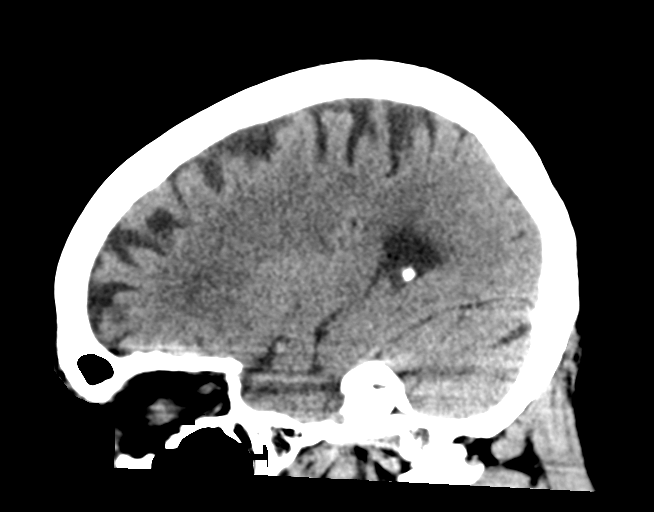

[15 of 47 positions shown; findings below may reference images not displayed]

FINDINGS: Brain: Mild atrophy.  Mild periventricular white matter hypodensity.

Negative for acute infarct, hemorrhage, mass.

Vascular: Negative for hyperdense vessel

Skull: Negative

Sinuses/Orbits: Paranasal sinuses clear.  Negative orbit

Other: None
IMPRESSION: No acute abnormality. Atrophy and chronic microvascular ischemic
change in the white matter. Mild progression since [DATE].

## 2022-11-09 NOTE — Patient Instructions (Addendum)
Deborah Neal , Thank you for taking time to come for your Medicare Wellness Visit. I appreciate your ongoing commitment to your health goals. Please review the following plan we discussed and let me know if I can assist you in the future.   These are the goals we discussed:  Goals      My healthcare goal for 2024 is to lose weight.     Diabetes: Goal Setting The 7 areas that you can work on to improve your diabetes are:   Healthy Eating - Decrease portions, eat at regular times, add whole grains, fruits and vegetables.   Being Active - Increase activity by walking, swimming or biking, take the stairs, park farther away.   Monitoring - Check blood sugar, record results, keep a journal. Monitoring can also include weight and blood pressure.   Taking Medicines - Take the right amount at the right time, learn how your medicine works and what the side effects are.   Problem Solving - Take care of high and low blood sugars, sick days, know who to call and when.   Reducing Risks - Stop smoking and start preventive care: get dilated eye exams, foot care, flu and pneumonia shots, and dental care.   Healthy Coping - Know when to ask for help and who you can talk to when you feel stressed or overwhelmed.   Pick something important to you and break your larger goals down into small steps that you can really achieve!  Caring for diabetes takes time and commitment, but it's worth it!   Keep in mind that change takes time. Once you choose a goal, break down the steps you will take this week to reach that goal.   Think of these as experiments, or things you will try to see if they work.   Think about why and what you will do differently next week if something didn't work.  Be patient with yourself and take it one day at a time.            This is a list of the screening recommended for you and due dates:  Health Maintenance  Topic Date Due   Zoster (Shingles) Vaccine (1 of 2) 06/23/2001   Eye exam for  diabetics  11/03/2021   Colon Cancer Screening  01/23/2023*   Flu Shot  11/25/2022   Hemoglobin A1C  03/06/2023   Mammogram  03/28/2023   Yearly kidney function blood test for diabetes  09/03/2023   Yearly kidney health urinalysis for diabetes  09/03/2023   Complete foot exam   09/03/2023   Medicare Annual Wellness Visit  11/09/2023   DTaP/Tdap/Td vaccine (3 - Td or Tdap) 07/03/2029   Pneumonia Vaccine  Completed   DEXA scan (bone density measurement)  Completed   Hepatitis C Screening  Completed   HPV Vaccine  Aged Out   COVID-19 Vaccine  Discontinued  *Topic was postponed. The date shown is not the original due date.    Advanced directives: Yes; patient has a Living Will  Conditions/risks identified: Yes; Uncontrolled Type II Diabetes  Next appointment: It was nice speaking with you today!  Please follow up in one year for your annual wellness visit via telephone call with Nurse Percell Miller on 11/10/2023 at 2:15 p.m.  If you need to cancel or reschedule please call [phone].   Preventive Care 66 Years and Older, Female Preventive care refers to lifestyle choices and visits with your health care provider that can promote health and  wellness. What does preventive care include? A yearly physical exam. This is also called an annual well check. Dental exams once or twice a year. Routine eye exams. Ask your health care provider how often you should have your eyes checked. Personal lifestyle choices, including: Daily care of your teeth and gums. Regular physical activity. Eating a healthy diet. Avoiding tobacco and drug use. Limiting alcohol use. Practicing safe sex. Taking low-dose aspirin every day. Taking vitamin and mineral supplements as recommended by your health care provider. What happens during an annual well check? The services and screenings done by your health care provider during your annual well check will depend on your age, overall health, lifestyle risk factors,  and family history of disease. Counseling  Your health care provider may ask you questions about your: Alcohol use. Tobacco use. Drug use. Emotional well-being. Home and relationship well-being. Sexual activity. Eating habits. History of falls. Memory and ability to understand (cognition). Work and work Astronomer. Reproductive health. Screening  You may have the following tests or measurements: Height, weight, and BMI. Blood pressure. Lipid and cholesterol levels. These may be checked every 5 years, or more frequently if you are over 49 years old. Skin check. Lung cancer screening. You may have this screening every year starting at age 46 if you have a 30-pack-year history of smoking and currently smoke or have quit within the past 15 years. Fecal occult blood test (FOBT) of the stool. You may have this test every year starting at age 26. Flexible sigmoidoscopy or colonoscopy. You may have a sigmoidoscopy every 5 years or a colonoscopy every 10 years starting at age 47. Hepatitis C blood test. Hepatitis B blood test. Sexually transmitted disease (STD) testing. Diabetes screening. This is done by checking your blood sugar (glucose) after you have not eaten for a while (fasting). You may have this done every 1-3 years. Bone density scan. This is done to screen for osteoporosis. You may have this done starting at age 51. Mammogram. This may be done every 1-2 years. Talk to your health care provider about how often you should have regular mammograms. Talk with your health care provider about your test results, treatment options, and if necessary, the need for more tests. Vaccines  Your health care provider may recommend certain vaccines, such as: Influenza vaccine. This is recommended every year. Tetanus, diphtheria, and acellular pertussis (Tdap, Td) vaccine. You may need a Td booster every 10 years. Zoster vaccine. You may need this after age 59. Pneumococcal 13-valent conjugate  (PCV13) vaccine. One dose is recommended after age 97. Pneumococcal polysaccharide (PPSV23) vaccine. One dose is recommended after age 46. Talk to your health care provider about which screenings and vaccines you need and how often you need them. This information is not intended to replace advice given to you by your health care provider. Make sure you discuss any questions you have with your health care provider. Document Released: 05/09/2015 Document Revised: 12/31/2015 Document Reviewed: 02/11/2015 Elsevier Interactive Patient Education  2017 [employer].  Fall Prevention in the Home Falls can cause injuries. They can happen to people of all ages. There are many things you can do to make your home safe and to help prevent falls. What can I do on the outside of my home? Regularly fix the edges of walkways and driveways and fix any cracks. Remove anything that might make you trip as you walk through a door, such as a raised step or threshold. Trim any bushes or trees on  the path to your home. Use bright outdoor lighting. Clear any walking paths of anything that might make someone trip, such as rocks or tools. Regularly check to see if handrails are loose or broken. Make sure that both sides of any steps have handrails. Any raised decks and porches should have guardrails on the edges. Have any leaves, snow, or ice cleared regularly. Use sand or salt on walking paths during winter. Clean up any spills in your garage right away. This includes oil or grease spills. What can I do in the bathroom? Use night lights. Install grab bars by the toilet and in the tub and shower. Do not use towel bars as grab bars. Use non-skid mats or decals in the tub or shower. If you need to sit down in the shower, use a plastic, non-slip stool. Keep the floor dry. Clean up any water that spills on the floor as soon as it happens. Remove soap buildup in the tub or shower regularly. Attach bath mats securely with  double-sided non-slip rug tape. Do not have throw rugs and other things on the floor that can make you trip. What can I do in the bedroom? Use night lights. Make sure that you have a light by your bed that is easy to reach. Do not use any sheets or blankets that are too big for your bed. They should not hang down onto the floor. Have a firm chair that has side arms. You can use this for support while you get dressed. Do not have throw rugs and other things on the floor that can make you trip. What can I do in the kitchen? Clean up any spills right away. Avoid walking on wet floors. Keep items that you use a lot in easy-to-reach places. If you need to reach something above you, use a strong step stool that has a grab bar. Keep electrical cords out of the way. Do not use floor polish or wax that makes floors slippery. If you must use wax, use non-skid floor wax. Do not have throw rugs and other things on the floor that can make you trip. What can I do with my stairs? Do not leave any items on the stairs. Make sure that there are handrails on both sides of the stairs and use them. Fix handrails that are broken or loose. Make sure that handrails are as long as the stairways. Check any carpeting to make sure that it is firmly attached to the stairs. Fix any carpet that is loose or worn. Avoid having throw rugs at the top or bottom of the stairs. If you do have throw rugs, attach them to the floor with carpet tape. Make sure that you have a light switch at the top of the stairs and the bottom of the stairs. If you do not have them, ask someone to add them for you. What else can I do to help prevent falls? Wear shoes that: Do not have high heels. Have rubber bottoms. Are comfortable and fit you well. Are closed at the toe. Do not wear sandals. If you use a stepladder: Make sure that it is fully opened. Do not climb a closed stepladder. Make sure that both sides of the stepladder are locked  into place. Ask someone to hold it for you, if possible. Clearly mark and make sure that you can see: Any grab bars or handrails. First and last steps. Where the edge of each step is. Use tools that help you move around (  mobility aids) if they are needed. These include: Canes. Walkers. Scooters. Crutches. Turn on the lights when you go into a dark area. Replace any light bulbs as soon as they burn out. Set up your furniture so you have a clear path. Avoid moving your furniture around. If any of your floors are uneven, fix them. If there are any pets around you, be aware of where they are. Review your medicines with your doctor. Some medicines can make you feel dizzy. This can increase your chance of falling. Ask your doctor what other things that you can do to help prevent falls. This information is not intended to replace advice given to you by your health care provider. Make sure you discuss any questions you have with your health care provider. Document Released: 02/06/2009 Document Revised: 09/18/2015 Document Reviewed: 05/17/2014 Elsevier Interactive Patient Education  2017 [employer].

## 2022-11-09 NOTE — Progress Notes (Signed)
Subjective:   Deborah Neal is a 72 y.o. female who presents for an Initial Medicare Annual Wellness Visit.  Visit Complete: Virtual  I connected with  Richelle Ito on 11/09/22 by a audio enabled telemedicine application and verified that I am speaking with the correct person using two identifiers.  Patient Location: Home  Provider Location: Office/Clinic  I discussed the limitations of evaluation and management by telemedicine. The patient expressed understanding and agreed to proceed.  Per patient no change in vitals since last visit, unable to obtain new vitals due to telehealth visit  Review of Systems     Cardiac Risk Factors include: advanced age (>4men, >6 women);diabetes mellitus;dyslipidemia;family history of premature cardiovascular disease;hypertension     Objective:    Today's Vitals   11/09/22 1433  Weight: 159 lb (72.1 kg)  Height: 5\' 2"  (1.575 m)  PainSc: 0-No pain   Body mass index is 29.08 kg/m.     11/09/2022    2:36 PM 08/25/2022    9:08 PM 04/25/2022    4:50 PM 04/22/2022   11:00 AM 04/23/2021    7:56 PM 11/09/2020    7:41 PM 09/30/2017   11:30 AM  Advanced Directives  Does Patient Have a Medical Advance Directive?  Yes No No No No No  Type of Advance Directive Living will Healthcare Power of Attorney       Does patient want to make changes to medical advance directive?  No - Patient declined       Copy of Healthcare Power of Attorney in Chart?  No - copy requested       Would patient like information on creating a medical advance directive?  No - Patient declined No - Patient declined Yes (Inpatient - patient defers creating a medical advance directive at this time - Information given) No - Patient declined No - Patient declined No - Patient declined    Current Medications (verified) Outpatient Encounter Medications as of 11/09/2022  Medication Sig   Accu-Chek Softclix Lancets lancets Use as directed up to four times daily as directed    acetaminophen (TYLENOL) 325 MG tablet Take 1-2 tablets (325-650 mg total) by mouth every 4 (four) hours as needed for mild pain.   amLODipine (NORVASC) 2.5 MG tablet Take 3 tablets (7.5 mg total) by mouth daily.   apixaban (ELIQUIS) 5 MG TABS tablet Take 1 tablet (5 mg total) by mouth 2 (two) times daily.   blood glucose meter kit and supplies Use up to four times daily as directed.   furosemide (LASIX) 20 MG tablet Take 1 tablet (20 mg total) by mouth daily.   glucose blood test strip Use as directed up to four times daily as directed   HYDROcodone-acetaminophen (NORCO/VICODIN) 5-325 MG tablet Take 1 tablet by mouth every 4 (four) hours as needed for moderate pain.   insulin glargine (LANTUS) 100 UNIT/ML Solostar Pen Inject 16 Units into the skin 2 (two) times daily.   Insulin Pen Needle (CAREFINE PEN NEEDLES) 32G X 4 MM MISC Use to inject insulin 4 times daily.   levothyroxine (SYNTHROID) 75 MCG tablet Take 1 tablet (75 mcg total) by mouth daily at 6 (six) AM.   lidocaine (LIDODERM) 5 % Place 3 patches onto the skin daily. Remove & Discard patch within 12 hours or as directed by MD   losartan (COZAAR) 25 MG tablet Take 1 tablet (25 mg total) by mouth daily.   magnesium oxide (MAG-OX) 400 MG tablet Take 1 tablet (400  mg total) by mouth daily.   Multiple Vitamin (MULTIVITAMIN WITH MINERALS) TABS tablet Take 1 tablet by mouth daily.   potassium chloride SA (KLOR-CON M) 20 MEQ tablet Take 1 tablet (20 mEq total) by mouth daily.   rosuvastatin (CRESTOR) 20 MG tablet Take 1 tablet (20 mg total) by mouth daily.   [DISCONTINUED] lisdexamfetamine (VYVANSE) 70 MG capsule Take 1 capsule (70 mg total) by mouth daily.   No facility-administered encounter medications on file as of 11/09/2022.    Allergies (verified) Shingrix [zoster vac recomb adjuvanted]   History: Past Medical History:  Diagnosis Date   Acute massive pulmonary embolism (HCC)    ALLERGIC RHINITIS 01/29/2007   Aortic atherosclerosis  (HCC)    Arthritis    ASTHMA 01/29/2007   ASTHMA, WITH ACUTE EXACERBATION 06/24/2008   BACK PAIN 01/29/2009   Cardiac arrest [facility])    CHEST PAIN 06/24/2008   Coronary artery calcification seen on CT scan    Family history of adverse reaction to anesthesia    Patients daughter has N/V after anesthesia   GERD (gastroesophageal reflux disease)    History of shingles    HYPERLIPIDEMIA 01/29/2007   HYPERTENSION 01/29/2007   OSTEOPENIA 10/16/2007   Type II or unspecified type diabetes mellitus without mention of complication, uncontrolled 11/29/2013   Past Surgical History:  Procedure Laterality Date   CHOLECYSTECTOMY N/A 09/19/2015   Procedure: LAPAROSCOPIC CHOLECYSTECTOMY WITH INTRAOPERATIVE CHOLANGIOGRAM;  Surgeon: Avel Peace, MD;  Location: [facility] OR;  Service: General;  Laterality: N/A;   COLONOSCOPY     TONSILLECTOMY  1956   VIDEO BRONCHOSCOPY Bilateral 03/28/2014   Procedure: VIDEO BRONCHOSCOPY WITHOUT FLUORO;  Surgeon: Nyoka Cowden, MD;  Location: [facility] ENDOSCOPY;  Service: Cardiopulmonary;  Laterality: Bilateral;   Family History  Problem Relation Age of Onset   Cancer Mother        ? type    Diabetes Father    Hypertension Father    Heart disease Father    Asthma Father    Asthma Daughter    Colon cancer Neg Hx    Esophageal cancer Neg Hx    Rectal cancer Neg Hx    Stomach cancer Neg Hx    Breast cancer Neg Hx    Social History   Socioeconomic History   Marital status: Single    Spouse name: Not on file   Number of children: Not on file   Years of education: Not on file   Highest education level: Not on file  Occupational History   Not on file  Tobacco Use   Smoking status: Former    Current packs/day: 0.00    Types: Cigarettes    Quit date: 09/07/2003    Years since quitting: 19.1   Smokeless tobacco: Never  Vaping Use   Vaping status: Never Used  Substance and Sexual Activity   Alcohol use: No   Drug use: No   Sexual activity: Never  Other Topics  Concern   Not on file  Social History Narrative   Not on file   Social Determinants of Health   Financial Resource Strain: Low Risk  (11/09/2022)   Overall Financial Resource Strain (CARDIA)    Difficulty of Paying Living Expenses: Not hard at all  Food Insecurity: No Food Insecurity (11/09/2022)   Hunger Vital Sign    Worried About Running Out of Food in the Last Year: Never true    Ran Out of Food in the Last Year: Never true  Transportation Needs: No Transportation Needs (  11/09/2022)   PRAPARE - Administrator, Civil Service (Medical): No    Lack of Transportation (Non-Medical): No  Physical Activity: Insufficiently Active (11/09/2022)   Exercise Vital Sign    Days of Exercise per Week: 7 days    Minutes of Exercise per Session: 20 min  Stress: No Stress Concern Present (11/09/2022)   [employer] of Occupational Health - Occupational Stress Questionnaire    Feeling of Stress : Not at all  Social Connections: Moderately Integrated (11/09/2022)   Social Connection and Isolation Panel [NHANES]    Frequency of Communication with Friends and Family: More than three times a week    Frequency of Social Gatherings with Friends and Family: More than three times a week    Attends Religious Services: More than 4 times per year    Active Member of [employer] or Organizations: Yes    Attends Engineer, structural: More than 4 times per year    Marital Status: Never married    Tobacco Counseling Counseling given: Not Answered   Clinical Intake:  Pre-visit preparation completed: Yes  Pain : No/denies pain Pain Score: 0-No pain     BMI - recorded: 29.08 Nutritional Status: BMI 25 -29 Overweight Nutritional Risks: None Diabetes: Yes CBG done?: No Did pt. bring in CBG monitor from home?: No  How often do you need to have someone help you when you read instructions, pamphlets, or other written materials from your doctor or pharmacy?: 1 - Never What is the last  grade level you completed in school?: 10th grade  Interpreter Needed?: No  Information entered by :: Ger Nicks N. Alexzandrea Normington, LPN.   Activities of Daily Living    11/09/2022    2:38 PM 04/25/2022    8:00 PM  In your present state of health, do you have any difficulty performing the following activities:  Hearing? 0   Vision? 0   Difficulty concentrating or making decisions? 0   Walking or climbing stairs? 0   Dressing or bathing? 0   Doing errands, shopping? 0 1  Preparing Food and eating ? N   Using the Toilet? N   In the past six months, have you accidently leaked urine? Y   Do you have problems with loss of bowel control? N   Managing your Medications? N   Managing your Finances? N   Housekeeping or managing your Housekeeping? N     Patient Care Team: Corwin Levins, MD as PCP - General [place]if[place] James Nile Dear, MD as PCP - Cardiology (Cardiology)  Indicate any recent Medical Services you may have received from other than [facility] providers in the past year (date may be approximate).     Assessment:   This is a routine wellness examination for [place].  Hearing/Vision screen Hearing Screening - Comments:: Denies hearing difficulties   Vision Screening - Comments:: Wears rx glasses -   Dietary issues and exercise activities discussed:     Goals Addressed             This Visit's Progress    My healthcare goal for 2024 is to lose weight.       Diabetes: Goal Setting The 7 areas that you can work on to improve your diabetes are:   Healthy Eating - Decrease portions, eat at regular times, add whole grains, fruits and vegetables.   Being Active - Increase activity by walking, swimming or biking, take the stairs, park farther away.   Monitoring - Check blood sugar,  record results, keep a journal. Monitoring can also include weight and blood pressure.   Taking Medicines - Take the right amount at the right time, learn how your medicine works and what the side effects are.    Problem Solving - Take care of high and low blood sugars, sick days, know who to call and when.   Reducing Risks - Stop smoking and start preventive care: get dilated eye exams, foot care, flu and pneumonia shots, and dental care.   Healthy Coping - Know when to ask for help and who you can talk to when you feel stressed or overwhelmed.   Pick something important to you and break your larger goals down into small steps that you can really achieve!  Caring for diabetes takes time and commitment, but it's worth it!   Keep in mind that change takes time. Once you choose a goal, break down the steps you will take this week to reach that goal.   Think of these as experiments, or things you will try to see if they work.   Think about why and what you will do differently next week if something didn't work.  Be patient with yourself and take it one day at a time.          Depression Screen    11/09/2022    2:38 PM 09/03/2022    1:07 PM 05/05/2022    3:59 PM 01/22/2022   11:00 AM 01/22/2022   10:38 AM 07/22/2021    3:43 PM 07/22/2021    3:09 PM  PHQ 2/9 Scores  PHQ - 2 Score 0 0 0 0  0 0  PHQ- 9 Score 0        Exception Documentation     Patient refusal      Fall Risk    11/09/2022    2:38 PM 09/03/2022    1:07 PM 05/05/2022    3:59 PM 01/22/2022   11:00 AM 01/22/2022   10:38 AM  Fall Risk   Falls in the past year? 0 0 0 0 0  Number falls in past yr: 0 0  0   Injury with Fall? 0 0 0 0 0  Risk for fall due to : No Fall Risks No Fall Risks No Fall Risks  No Fall Risks  Follow up Falls prevention discussed Falls evaluation completed Falls evaluation completed  Falls evaluation completed    MEDICARE RISK AT HOME:  Medicare Risk at Home - 11/09/22 1437     Any stairs in or around the home? No    If so, are there any without handrails? No    Home free of loose throw rugs in walkways, pet beds, electrical cords, etc? Yes    Adequate lighting in your home to reduce risk of falls? Yes     Life alert? No    Use of a cane, walker or w/c? No    Grab bars in the bathroom? No    Shower chair or bench in shower? No    Elevated toilet seat or a handicapped toilet? No             TIMED UP AND GO:  Was the test performed? No    Cognitive Function:        11/09/2022    2:53 PM  6CIT Screen  What Year? 0 points  What month? 0 points  What time? 0 points  Count back from 20 0 points  Months in reverse 0 points  Repeat phrase 0 points  Total Score 0 points    Immunizations Immunization History  Administered Date(s) Administered   Fluad Quad(high Dose 65+) 02/16/2019, 01/09/2020, 03/10/2021, 01/22/2022   Influenza Split 03/02/2011   Influenza Whole 01/29/2009   Influenza, High Dose Seasonal PF 02/22/2017   Influenza,inj,Quad PF,6+ Mos 05/30/2013, 05/15/2014, 07/14/2015   Influenza-Unspecified 05/27/2016, 11/24/2017   Pneumococcal Conjugate-13 12/13/2017   Pneumococcal Polysaccharide-23 02/22/2017   Td 01/29/2009   Tdap 07/04/2019   Zoster, Live 09/19/2012    TDAP status: Up to date  Flu Vaccine status: Up to date  Pneumococcal vaccine status: Up to date  Covid-19 vaccine status: Completed vaccines  Qualifies for Shingles Vaccine? Yes   Zostavax completed Yes   Shingrix Completed?: No.    Education has been provided regarding the importance of this vaccine. Patient has been advised to call insurance company to determine out of pocket expense if they have not yet received this vaccine. Advised may also receive vaccine at local pharmacy or Health Dept. Verbalized acceptance and understanding.  Screening Tests Health Maintenance  Topic Date Due   Zoster Vaccines- Shingrix (1 of 2) 06/23/2001   OPHTHALMOLOGY EXAM  11/03/2021   Colonoscopy  01/23/2023 (Originally 12/03/2021)   INFLUENZA VACCINE  11/25/2022   HEMOGLOBIN A1C  03/06/2023   MAMMOGRAM  03/28/2023   Diabetic kidney evaluation - eGFR measurement  09/03/2023   Diabetic kidney evaluation - Urine  ACR  09/03/2023   FOOT EXAM  09/03/2023   Medicare Annual Wellness (AWV)  11/09/2023   DTaP/Tdap/Td (3 - Td or Tdap) 07/03/2029   Pneumonia Vaccine 19+ Years old  Completed   DEXA SCAN  Completed   Hepatitis C Screening  Completed   HPV VACCINES  Aged Out   COVID-19 Vaccine  Discontinued    Health Maintenance  Health Maintenance Due  Topic Date Due   Zoster Vaccines- Shingrix (1 of 2) 06/23/2001   OPHTHALMOLOGY EXAM  11/03/2021    Colorectal cancer screening: Referral to GI placed 11/09/2022. Pt aware the office will call re: appt.  Mammogram status: Ordered 11/09/2022. Pt provided with contact info and advised to call to schedule appt.   Bone Density status: Ordered 11/09/2022. Pt provided with contact info and advised to call to schedule appt.  Lung Cancer Screening: (Low Dose CT Chest recommended if Age 56-80 years, 20 pack-year currently smoking OR have quit w/in 15years.) does not qualify.   Lung Cancer Screening Referral: no  Additional Screening:  Hepatitis C Screening: does qualify; Completed 01/30/2009  Vision Screening: Recommended annual ophthalmology exams for early detection of glaucoma and other disorders of the eye. Is the patient up to date with their annual eye exam?  No  Who is the provider or what is the name of the office in which the patient attends annual eye exams? Referral placed to [facility] for retinopathy screening. If pt is not established with a provider, would they like to be referred to a provider to establish care? Yes .   Dental Screening: Recommended annual dental exams for proper oral hygiene  Diabetic Foot Exam: Diabetic Foot Exam: Completed 09/03/2022  Community Resource Referral / Chronic Care Management: CRR required this visit?  Yes   CCM required this visit?  PCP informed of CCM need     Plan:     I have personally reviewed and noted the following in the patient's chart:   Medical and social history Use of alcohol,  tobacco or illicit drugs  Current medications and supplements  including opioid prescriptions. Patient is currently taking opioid prescriptions. Information provided to patient regarding non-opioid alternatives. Patient advised to discuss non-opioid treatment plan with their provider. Functional ability and status Nutritional status Physical activity Advanced directives List of other physicians Hospitalizations, surgeries, and ER visits in previous 12 months Vitals Screenings to include cognitive, depression, and falls Referrals and appointments  In addition, I have reviewed and discussed with patient certain preventive protocols, quality metrics, and best practice recommendations. A written personalized care plan for preventive services as well as general preventive health recommendations were provided to patient.     Mickeal Needy, LPN   08/07/2438   After Visit Summary: (Mail) Due to this being a telephonic visit, the after visit summary with patients personalized plan was offered to patient via mail   Nurse Notes: Normal cognitive status assessed by direct observation via telephone conversation by this Nurse Health Advisor. No abnormalities found.

## 2022-11-10 ENCOUNTER — Other Ambulatory Visit (HOSPITAL_COMMUNITY): Payer: Self-pay

## 2022-11-10 ENCOUNTER — Ambulatory Visit: Payer: Medicare PPO

## 2022-11-10 ENCOUNTER — Ambulatory Visit
Admission: RE | Admit: 2022-11-10 | Discharge: 2022-11-10 | Disposition: A | Discharge status: Home / Self Care | Payer: Medicare PPO | Source: Ambulatory Visit | Attending: Internal Medicine | Admitting: Internal Medicine

## 2022-11-10 DIAGNOSIS — Z1239 Encounter for other screening for malignant neoplasm of breast: Secondary | ICD-10-CM

## 2022-11-10 DIAGNOSIS — Z1231 Encounter for screening mammogram for malignant neoplasm of breast: Secondary | ICD-10-CM | POA: Diagnosis not present

## 2022-11-10 LAB — HM DIABETES EYE EXAM

## 2022-11-11 ENCOUNTER — Telehealth: Payer: Self-pay | Admitting: *Deleted

## 2022-11-11 ENCOUNTER — Telehealth: Payer: Self-pay | Admitting: Internal Medicine

## 2022-11-11 NOTE — Progress Notes (Unsigned)
  Care Coordination  Outreach Note  11/11/2022 Name: Deborah Neal MRN: [id number] DOB: 1951/04/15   Care Coordination Outreach Attempts: An unsuccessful telephone outreach was attempted today to offer the patient information about available care coordination services.  Follow Up Plan:  Additional outreach attempts will be made to offer the patient care coordination information and services.   Encounter Outcome:  No Answer  Burman Nieves, CCMA Care Coordination Care Guide Direct Dial: [phone]

## 2022-11-11 NOTE — Telephone Encounter (Signed)
Unfortunately I have no control over the referrals and scheduling, but I can forward this to our PCCs, thanks

## 2022-11-11 NOTE — Telephone Encounter (Signed)
Pt daughter called stating pt was referred to an eye specialist but they don't have any openings until April. Pt wanted to know could she get referred some where else. Please advise.

## 2022-11-15 NOTE — Progress Notes (Signed)
  Care Coordination   Note   11/15/2022 Name: JACQUELYN SHADRICK MRN: [id number] DOB: 11-Apr-1951  ELLIA KNOWLTON is a 72 y.o. year old female who sees Corwin Levins, MD for primary care. I reached out to Richelle Ito by phone today to offer care coordination services.  Ms. Robertshaw was given information about Care Coordination services today including:   The Care Coordination services include support from the care team which includes your Nurse Coordinator, Clinical Social Worker, or Pharmacist.  The Care Coordination team is here to help remove barriers to the health concerns and goals most important to you. Care Coordination services are voluntary, and the patient may decline or stop services at any time by request to their care team member.   Care Coordination Consent Status: Patient agreed to services and verbal consent obtained.   Follow up plan:  Telephone appointment with care coordination team member scheduled for:  12/07/2022  Encounter Outcome:  Pt. Scheduled from referral   Burman Nieves, [facility] Care Coordination Care Guide Direct Dial: [phone]

## 2022-11-16 ENCOUNTER — Encounter: Payer: Self-pay | Admitting: Internal Medicine

## 2022-11-17 NOTE — Telephone Encounter (Signed)
Spoke with patient regarding sleep study result's   Call patient   Sleep study result   Date of study: 08/25/2022   Impression: Mild obstructive sleep apnea Mild oxygen desaturations   Recommendation: Options of treatment for mild obstructive sleep apnea will include   1.  CPAP therapy if there is significant daytime sleepiness or other comorbidities including history of CVA or cardiac disease   -If CPAP is chosen as an option of treatment auto titrating CPAP with a pressure setting of 5-15 will be appropriate   2.  Watchful waiting with emphasis on weight loss measures, sleep position modification to optimize lateral sleep, elevating the head of the bed by about 30 degrees may also help.   3.  An oral device may be fashioned for the treatment of mild sleep disordered breathing, will involve referral to dentist.     Follow-up as previously scheduled        Patient was scheduled with Beth to go over sleeps study result's on 12/20/22.  Patient's voice was understanding.Nothing else further needed.

## 2022-11-23 ENCOUNTER — Other Ambulatory Visit (HOSPITAL_COMMUNITY): Payer: Self-pay

## 2022-11-29 DIAGNOSIS — R5381 Other malaise: Secondary | ICD-10-CM | POA: Diagnosis not present

## 2022-11-29 DIAGNOSIS — J45909 Unspecified asthma, uncomplicated: Secondary | ICD-10-CM | POA: Diagnosis not present

## 2022-11-29 DIAGNOSIS — I469 Cardiac arrest, cause unspecified: Secondary | ICD-10-CM | POA: Diagnosis not present

## 2022-11-29 DIAGNOSIS — G9340 Encephalopathy, unspecified: Secondary | ICD-10-CM | POA: Diagnosis not present

## 2022-12-06 ENCOUNTER — Other Ambulatory Visit (HOSPITAL_COMMUNITY): Payer: Self-pay

## 2022-12-07 ENCOUNTER — Ambulatory Visit: Payer: Self-pay

## 2022-12-07 ENCOUNTER — Telehealth: Payer: Self-pay

## 2022-12-07 NOTE — Patient Outreach (Signed)
  Care Coordination   Initial Visit Note   12/07/2022 Name: Deborah Neal MRN: [id number] DOB: [birth year]  Deborah Neal is a 72 y.o. year old female who sees Deborah Levins, MD for primary care. I spoke with  Deborah Neal by phone today.  What matters to the patients health and wellness today?  Referral from PCP. Patient reports she does not check her blood sugar because she does not like to stick her finger. Deborah Neal on speaker phone and reports [name] "likes to drink a lot of soda and eat a lot of pork". Patient does not remember the last time she checked blood sugar, but states it is usally 300's- 500's. She checked blood sugar during this assessment(around 11:30 am) and reports a blood sugar level of 499 (reports a sandwich and a soda around 7 am this morning. She denies any symptoms of hyperglycemia, but states she drinks a lot of water because she like to drink water.  She has an upcoming appointment scheduled on 12/21/22 with a new Endocrinologist and states will discuss Continuous glucose meter with endocrinologist. She reports she is taking Lantus 16 units twice a day.  Goals Addressed             This Visit's Progress    Assist with Health management/education Diabetes       Interventions Today    Flowsheet Row Most Recent Value  Chronic Disease   Chronic disease during today's visit Diabetes  General Interventions   General Interventions Discussed/Reviewed General Interventions Discussed, Communication with, Doctor Visits  Doctor Visits Discussed/Reviewed Doctor Visits Discussed, Doctor Visits Reviewed  [reviewed upcoming/scheduled visits with patient, discussed importance of attending visits, confirmed she has transportation,]  Communication with PCP/Specialists  [regarding increased blood sugar]  Education Interventions   Education Provided Provided Education, Provided Retail banker and diet:AbC's of diabetes: diabetes type 2]  Provided Verbal Education On Eye  Care, Blood Sugar Monitoring, Labs, Medication, Other  [discussed signs/symptoms of hyperglycemia,  the importance of checking blood sugars, reviewed diet and encouraged to decrease if not stop sodas, concentrated sweets and carbs such as rice, bread and potatoes. encouraged lean meats and more vegetables]  Labs Reviewed Hgb A1c  [09/03/22  A1C >14]  Nutrition Interventions   Nutrition Discussed/Reviewed Nutrition Discussed, Decreasing sugar intake  [encouraged to decrease/minimize concentrated sweets including sodas. encouraged drinks such as bai water or zevia.]  Pharmacy Interventions   Pharmacy Dicussed/Reviewed Pharmacy Topics Discussed            SDOH assessments and interventions completed:  Yes recently completed. No changes.  Care Coordination Interventions:  Yes, provided   Follow up plan: Follow up call scheduled for 12/28/22    Encounter Outcome:  Pt. Visit Completed   Kathyrn Sheriff, RN, MSN, BSN, CCM [facility] Care Coordinator [phone]

## 2022-12-07 NOTE — Patient Outreach (Signed)
  Care Coordination   12/07/2022 Name: Deborah Neal MRN: [id number] DOB: 04-03-1951   Care Coordination Outreach Attempts:  An unsuccessful telephone outreach was attempted for a scheduled appointment today.  Follow Up Plan:  Additional outreach attempts will be made to offer the patient care coordination information and services.   Encounter Outcome:  No Answer   Care Coordination Interventions:  No, not indicated    Kathyrn Sheriff, RN, MSN, BSN, CCM [facility] Care Coordinator [phone]

## 2022-12-07 NOTE — Patient Instructions (Addendum)
Visit Information  Thank you for taking time to visit with me today. Please don't hesitate to contact me if I can be of assistance to you.   Following are the goals we discussed today:  Please attend appointment with Pulmonology 12/20/22 at 3:00 pm Please attend appointment with you Blood sugar doctor, Dr. Erroll Luna 12/21/22 10:40 am Use a Calendar to keep track of your appointments Begin to check and write down blood sugars and take with you to your appointment with Dr. Lanier Prude your provider with health questions or concerns as needed  Our next appointment is by telephone on 12/28/22 at 10:00 am  Please call the care guide team at [phone] if you need to cancel or reschedule your appointment.   If you are experiencing a Mental Health or Behavioral Health Crisis or need someone to talk to, please call the Suicide and Crisis Lifeline: 988 call the [place] National Suicide Prevention Lifeline: 650-650-6333 or TTY: (939)633-8354 TTY 217-278-8535) to talk to a trained counselor call 1-800-273-TALK (toll free, 24 hour hotline)  Kathyrn Sheriff, RN, MSN, BSN, CCM [facility] Care Coordinator [phone]   Hyperglycemia Hyperglycemia is when the sugar (glucose) level in your blood is too high. High blood sugar can happen to people who have or do not have diabetes. High blood sugar can happen quickly. It can be an emergency. What are the causes? If you have diabetes, high blood sugar may be caused by: Medicines that increase blood sugar or affect your control of diabetes. Getting less physical activity. Overeating. Being sick or injured or having an infection. Having surgery. Stress. Not giving yourself enough insulin (if you are taking it). You may have high blood sugar because you have diabetes that has not been diagnosed yet. If you do not have diabetes, high blood sugar may be caused by: Certain medicines. Stress. A bad illness. An infection. Having surgery. Diseases of the  pancreas. What increases the risk? This condition is more likely to develop in people who have risk factors for diabetes, such as: Having a family member with diabetes. Certain conditions in which the body's defense system (immune system) attacks itself. These are called autoimmune disorders. Being overweight. Not being active. Having a condition called insulin resistance. Having a history of: Prediabetes. Diabetes when pregnant. Polycystic ovarian syndrome (PCOS). What are the signs or symptoms? This condition may not cause symptoms. If you do have symptoms, they may include: Feeling more thirsty than normal. Needing to pee (urinate) more often than normal. Hunger. Feeling very tired. Blurry eyesight (vision). You may get other symptoms as the condition gets worse, such as: Dry mouth. Pain in your belly (abdomen). Not being hungry (loss of appetite). Breath that smells fruity. Weakness. Weight loss that is not planned. A tingling or numb feeling in your hands or feet. A headache. Cuts or bruises that heal slowly. How is this treated? Treatment depends on the cause of your condition. Treatment may include: Taking medicine to control your blood sugar levels. Changing your medicine or dosage if you take insulin or other diabetes medicines. Lifestyle changes. These may include: Exercising more. Eating healthier foods. Losing weight. Treating an illness or infection. Checking your blood sugar more often. Stopping or reducing steroid medicines. If your condition gets very bad, you will need to be treated in the hospital. Follow these instructions at home: General instructions Take over-the-counter and prescription medicines only as told by your doctor. Do not smoke or use any products that contain nicotine or tobacco. If you need  help quitting, ask your doctor. If you drink alcohol: Limit how much you have to: 0-1 drink a day for women who are not pregnant. 0-2 drinks a day  for men. Know how much alcohol is in a drink. In the [place] one drink equals one 12 oz bottle of beer (355 mL), one 5 oz glass of wine (148 mL), or one 1 oz glass of hard liquor (44 mL). Manage stress. If you need help with this, ask your doctor. Do exercises as told by your doctor. Keep all follow-up visits. Eating and drinking  Stay at a healthy weight. Make sure you drink enough fluid when you: Exercise. Get sick. Are in hot temperatures. Drink enough fluid to keep your pee (urine) pale yellow. If you have diabetes:  Know the symptoms of high blood sugar. Follow your diabetes management plan as told by your doctor. Make sure you: Take insulin and medicines as told. Follow your exercise plan. Follow your meal plan. Eat on time. Do not skip meals. Check your blood sugar as often as told. Make sure you check before and after exercise. If you exercise longer or in a different way, check your blood sugar more often. Follow your sick day plan whenever you cannot eat or drink normally. Make this plan ahead of time with your doctor. Share your diabetes management plan with people in your workplace, school, and household. Check your pee for ketones when you are ill and as told by your doctor. Carry a card or wear jewelry that says that you have diabetes. Where to find more information American Diabetes Association: www.diabetes.org Contact a doctor if: Your blood sugar level is at or above 240 mg/dL (16.1 mmol/L) for 2 days in a row. You have problems keeping your blood sugar in your target range. You have high blood pressure often. You have signs of illness, such as: Feeling like you may vomit (feeling nauseous). Vomiting. A fever. Get help right away if: Your blood sugar monitor reads "high" even when you are taking insulin. You have trouble breathing. You have a change in how you think, feel, or act (mental status). You feel like you may vomit, and the feeling does not go  away. You cannot stop vomiting. These symptoms may be an emergency. Get medical help right away. Call your local emergency services (911 in the [place] Do not wait to see if the symptoms will go away. Do not drive yourself to the hospital. Summary Hyperglycemia is when the sugar (glucose) level in your blood is too high. High blood sugar can happen to people who have or do not have diabetes. Make sure you drink enough fluids and follow your meal plan. Exercise as often as told by your doctor. Contact your doctor if you have problems keeping your blood sugar in your target range. This information is not intended to replace advice given to you by your health care provider. Make sure you discuss any questions you have with your health care provider. Document Revised: 01/24/2020 Document Reviewed: 01/25/2020 Elsevier Patient Education  2024 [employer].

## 2022-12-16 ENCOUNTER — Telehealth: Payer: Self-pay

## 2022-12-16 NOTE — Patient Instructions (Signed)
Visit Information  Thank you for taking time to visit with me today. Please don't hesitate to contact me if I can be of assistance to you.   Following are the goals we discussed today:  You have an appointment scheduled with Dr. Milton Ferguson doctor) on 08/30/2023 at 1:30 pm. Please call [phone], if you need to reschedule Contact your Primary Provider with questions or concerns.   Our next appointment is by telephone on 12/28/22 at 10:00 am  Please call the care guide team at [phone] if you need to cancel or reschedule your appointment.   If you are experiencing a Mental Health or Behavioral Health Crisis or need someone to talk to, please call the Suicide and Crisis Lifeline: 988 call the [place] National Suicide Prevention Lifeline: 8678647197 or TTY: (208)097-1948 TTY 579-654-0273) to talk to a trained counselor call 1-800-273-TALK (toll free, 24 hour hotline)  Kathyrn Sheriff, RN, MSN, BSN, CCM Care Management Coordinator [phone]

## 2022-12-16 NOTE — Patient Outreach (Signed)
  Care Coordination   Care Coordination  Visit Note   12/16/2022 Name: OK GANO MRN: [id number] DOB: 1950-08-16  DIAMONTE TOMS is a 72 y.o. year old female who sees Corwin Levins, MD for primary care. I spoke with  Richelle Ito by phone today.  Care Coordination: Per Primary provider referral re: Diabetes A1C 14; No eye exam since 11/03/20.  Per review of chart, referral sent to Dr. Sallye Lat. RNCM contacted Dr. Sallye Lat office. Next available May 2025-patient scheduled May 6th 2025 at 1:30 pm. Patient called and notified. Discussed importance of getting on provider's schedule. RNCM will Update primary provider as patient may need a referral to a different provider if sooner appointment needed.   Goals Addressed             This Visit's Progress    Assist with Health management/education Diabetes       Interventions Today    Flowsheet Row Most Recent Value  Chronic Disease   Chronic disease during today's visit Diabetes  General Interventions   General Interventions Discussed/Reviewed Communication with  Communication with PCP/Specialists  [contacted Dr. Dione Booze office to assist in scheduling an appointment. Next available with Dr. Sallye Lat Aug 30, 2023. Patient notified. Primary provider notified.]            SDOH assessments and interventions completed:  No  Care Coordination Interventions:  Yes, provided   Follow up plan:  as previously scheduled    Encounter Outcome:  Pt. Visit Completed   Kathyrn Sheriff, RN, MSN, BSN, CCM Care Management Coordinator [phone]

## 2022-12-20 ENCOUNTER — Encounter: Payer: Self-pay | Admitting: Primary Care

## 2022-12-20 ENCOUNTER — Ambulatory Visit: Payer: Medicare PPO | Admitting: Primary Care

## 2022-12-20 VITALS — BP 106/62 | HR 90 | Temp 98.6°F | Ht 62.0 in | Wt 161.4 lb

## 2022-12-20 DIAGNOSIS — G4730 Sleep apnea, unspecified: Secondary | ICD-10-CM

## 2022-12-20 NOTE — Patient Instructions (Signed)
Sleep study showed very mild sleep apnea, you had average 6.9 apneas (pauses) per hour or average  Mild OSA- 5-15 apneas per hour Moderate OSA-15-30 apneas per hour Severe OSA-greater than 30 apneas per hour  Treatment options include weight loss, side sleeping position, oral appliance (do not be candidate for this due to dentures), CPAP or referral to ENT for possible surgical options  I respect that you do not want to wear CPAP at this time.  Please speak with your cardiologist to ensure that they are okay with this, if experiencing excessive daytime sleepiness and falling asleep easily when in active would want to consider CPAP at that time.  You can send [place] a MyChart message if you would like to proceed with order  Follow-up 6 months with Dr. Wynona Neat or sooner if needed (OSA, asthma)  Sleep Apnea  Sleep apnea is a condition that affects your breathing while you are sleeping. Your tongue or soft tissue in your throat may block the flow of air while you sleep. You may have shallow breathing or stop breathing for short periods of time. People with sleep apnea may snore loudly. There are three kinds of sleep apnea: Obstructive sleep apnea. This kind is caused by a blocked or collapsed airway. This is the most common. Central sleep apnea. This kind happens when the part of the brain that controls breathing does not send the correct signals to the muscles that control breathing. Mixed sleep apnea. This is a combination of obstructive and central sleep apnea. What are the causes? The most common cause of sleep apnea is a collapsed or blocked airway. What increases the risk? Being very overweight. Having family members with sleep apnea. Having a tongue or tonsils that are larger than normal. Having a small airway or jaw problems. Being older. What are the signs or symptoms? Loud snoring. Restless sleep. Trouble staying asleep. Being sleepy or tired during the day. Waking up gasping or  choking. Having a headache in the morning. Mood swings. Having a hard time remembering things and concentrating. How is this diagnosed? A medical history. A physical exam. A sleep study. This is also called a polysomnography test. This test is done at a sleep lab or in your home while you are sleeping. How is this treated? Treatment may include: Sleeping on your side. Losing weight if you're overweight. Wearing an oral appliance. This is a mouthpiece that moves your lower jaw forward. Using a positive airway pressure (PAP) device to keep your airways open while you sleep, such as: A continuous positive airway pressure (CPAP) device. This device gives forced air through a mask when you breathe out. This keeps your airways open. A bilevel positive airway pressure (BIPAP) device. This device gives forced air through a mask when you breathe in and when you breathe out to keep your airways open. Having surgery if other treatments do not work. If your sleep apnea is not treated, you may be at risk for: Heart failure. Heart attack. Stroke. Type 2 diabetes or a problem with your blood sugar called insulin resistance. Follow these instructions at home: Medicines Take your medicines only as told by your health care provider. Avoid alcohol, medicines to help you relax, and certain pain medicines. These may make sleep apnea worse. General instructions Do not smoke, vape, or use products with nicotine or tobacco in them. If you need help quitting, talk with your provider. If you were given a PAP device to open your airway while you sleep, use  it as told by your provider. If you're having surgery, make sure to tell your provider you have sleep apnea. You may need to bring your PAP device with you. Contact a health care provider if: The PAP device that you were given to use during sleep bothers you or does not seem to be working. You do not feel better or you feel worse. Get help right away  if: You have trouble breathing. You have chest pain. You have trouble talking. One side of your body feels weak. A part of your face is hanging down. These symptoms may be an emergency. Call 911 right away. Do not wait to see if the symptoms will go away. Do not drive yourself to the hospital. This information is not intended to replace advice given to you by your health care provider. Make sure you discuss any questions you have with your health care provider. Document Revised: 06/17/2022 Document Reviewed: 06/17/2022 Elsevier Patient Education  2024 [employer].

## 2022-12-20 NOTE — Progress Notes (Signed)
@Patient  ID: Deborah Neal, female    DOB: 11-Jul-1950, 72 y.o.   MRN: [id number]  Chief Complaint  Patient presents with   Follow-up    Review split night sleep study 08/25/2022.  Wants to know why rx magnesium oxide tabs.    Referring provider: Corwin Levins, MD  HPI: 72 year old female, former smoker.  Past medical history significant for hypertension, pulmonary embolism, cardiac arrest, asthma, acute respiratory failure with hypoxia, type 2 diabetes, encephalopathy, osteopenia, thrombocytopenia, obesity.  Patient of Dr. Wynona Neat.  12/20/2022 Patient presents today to review split-night sleep study results.Accompanied by her daughter. PSG on 08/25/22 showed mild sleep apnea with mild oxygen desaturation, AHI 6.9 (mean oxygen 92%). Reviewed study results and treatment options. She does not want to start CPAP, she had a hard time tolerating CPAP when in the hospital in December 2023. CT chest February 2024 showed near complete resolution of previously identified bilateral airspace disease. Most recent echocardiogram showed normal pressure.   Allergies  Allergen Reactions   Shingrix [Zoster Vac Recomb Adjuvanted]     Allergy to original live zoster - rash    Immunization History  Administered Date(s) Administered   Fluad Quad(high Dose 65+) 02/16/2019, 01/09/2020, 03/10/2021, 01/22/2022   Influenza Split 03/02/2011   Influenza Whole 01/29/2009   Influenza, High Dose Seasonal PF 02/22/2017   Influenza,inj,Quad PF,6+ Mos 05/30/2013, 05/15/2014, 07/14/2015   Influenza-Unspecified 05/27/2016, 11/24/2017   Pneumococcal Conjugate-13 12/13/2017   Pneumococcal Polysaccharide-23 02/22/2017   Td 01/29/2009   Tdap 07/04/2019   Zoster, Live 09/19/2012    Past Medical History:  Diagnosis Date   Acute massive pulmonary embolism (HCC)    ALLERGIC RHINITIS 01/29/2007   Aortic atherosclerosis (HCC)    Arthritis    ASTHMA 01/29/2007   ASTHMA, WITH ACUTE EXACERBATION 06/24/2008   BACK PAIN  01/29/2009   Cardiac arrest [facility])    CHEST PAIN 06/24/2008   Coronary artery calcification seen on CT scan    Family history of adverse reaction to anesthesia    Patients daughter has N/V after anesthesia   GERD (gastroesophageal reflux disease)    History of shingles    HYPERLIPIDEMIA 01/29/2007   HYPERTENSION 01/29/2007   OSTEOPENIA 10/16/2007   Type II or unspecified type diabetes mellitus without mention of complication, uncontrolled 11/29/2013    Tobacco History: Social History   Tobacco Use  Smoking Status Former   Current packs/day: 0.00   Types: Cigarettes   Quit date: 09/07/2003   Years since quitting: 19.3   Passive exposure: Past  Smokeless Tobacco Never   Counseling given: Not Answered   Outpatient Medications Prior to Visit  Medication Sig Dispense Refill   Accu-Chek Softclix Lancets lancets Use as directed up to four times daily as directed 100 each 0   acetaminophen (TYLENOL) 325 MG tablet Take 1-2 tablets (325-650 mg total) by mouth every 4 (four) hours as needed for mild pain.     amLODipine (NORVASC) 2.5 MG tablet Take 3 tablets (7.5 mg total) by mouth daily. 90 tablet 3   apixaban (ELIQUIS) 5 MG TABS tablet Take 1 tablet (5 mg total) by mouth 2 (two) times daily. 60 tablet 4   blood glucose meter kit and supplies Use up to four times daily as directed. 1 each 0   furosemide (LASIX) 20 MG tablet Take 1 tablet (20 mg total) by mouth daily. 90 tablet 3   glucose blood test strip Use as directed up to four times daily as directed 400 each 3  HYDROcodone-acetaminophen (NORCO/VICODIN) 5-325 MG tablet Take 1 tablet by mouth every 4 (four) hours as needed for moderate pain. 20 tablet 0   Insulin Pen Needle (CAREFINE PEN NEEDLES) 32G X 4 MM MISC Use to inject insulin 4 times daily. 100 each 0   levothyroxine (SYNTHROID) 75 MCG tablet Take 1 tablet (75 mcg total) by mouth daily at 6 (six) AM. 90 tablet 3   lidocaine (LIDODERM) 5 % Place 3 patches onto the skin daily.  Remove & Discard patch within 12 hours or as directed by MD (Patient not taking: Reported on 12/21/2022) 30 patch 0   losartan (COZAAR) 25 MG tablet Take 1 tablet (25 mg total) by mouth daily. 90 tablet 3   Multiple Vitamin (MULTIVITAMIN WITH MINERALS) TABS tablet Take 1 tablet by mouth daily.     potassium chloride SA (KLOR-CON M) 20 MEQ tablet Take 1 tablet (20 mEq total) by mouth daily. 90 tablet 3   rosuvastatin (CRESTOR) 20 MG tablet Take 1 tablet (20 mg total) by mouth daily. 90 tablet 3   insulin glargine (LANTUS) 100 UNIT/ML Solostar Pen Inject 16 Units into the skin 2 (two) times daily. 15 mL 11   magnesium oxide (MAG-OX) 400 MG tablet Take 1 tablet (400 mg total) by mouth daily. (Patient not taking: Reported on 12/20/2022) 90 tablet 3   No facility-administered medications prior to visit.      Review of Systems  Review of Systems  Constitutional: Negative.  Negative for fatigue.  Respiratory: Negative.    Cardiovascular: Negative.    Physical Exam  BP 106/62 (BP Location: Right Arm, Patient Position: Sitting, Cuff Size: Large)   Pulse 90   Temp 98.6 F (37 C) (Oral)   Ht 5\' 2"  (1.575 m)   Wt 161 lb 6.4 oz (73.2 kg)   SpO2 98%   BMI 29.52 kg/m  Physical Exam Constitutional:      Appearance: Normal appearance.  HENT:     Head: Normocephalic and atraumatic.     Mouth/Throat:     Mouth: Mucous membranes are moist.     Pharynx: Oropharynx is clear.  Cardiovascular:     Rate and Rhythm: Normal rate and regular rhythm.  Pulmonary:     Effort: Pulmonary effort is normal.     Breath sounds: Normal breath sounds.  Musculoskeletal:        General: Normal range of motion.  Skin:    General: Skin is warm and dry.  Neurological:     General: No focal deficit present.     Mental Status: She is alert and oriented to person, place, and time. Mental status is at baseline.  Psychiatric:        Mood and Affect: Mood normal.        Behavior: Behavior normal.        Thought  Content: Thought content normal.        Judgment: Judgment normal.      Lab Results:  CBC    Component Value Date/Time   WBC 5.4 09/03/2022 1332   RBC 5.67 (H) 09/03/2022 1332   HGB 12.8 09/03/2022 1332   HGB 10.8 (L) 05/21/2022 1123   HCT 40.3 09/03/2022 1332   HCT 36.5 05/21/2022 1123   PLT 223.0 09/03/2022 1332   PLT 312 05/21/2022 1123   MCV 71.1 (L) 09/03/2022 1332   MCV 76 (L) 05/21/2022 1123   MCH 22.4 (L) 05/21/2022 1123   MCH 23.2 (L) 05/03/2022 0644   MCHC 31.7 09/03/2022 1332  RDW 16.1 (H) 09/03/2022 1332   RDW 14.5 05/21/2022 1123   LYMPHSABS 2.4 09/03/2022 1332   MONOABS 0.3 09/03/2022 1332   EOSABS 0.1 09/03/2022 1332   BASOSABS 0.0 09/03/2022 1332    BMET    Component Value Date/Time   NA 138 09/03/2022 1332   NA 142 05/21/2022 1123   K 3.9 09/03/2022 1332   CL 101 09/03/2022 1332   CO2 29 09/03/2022 1332   GLUCOSE 322 (H) 09/03/2022 1332   BUN 19 09/03/2022 1332   BUN 20 05/21/2022 1123   CREATININE 0.96 09/03/2022 1332   CREATININE 0.96 01/09/2020 1404   CALCIUM 9.8 09/03/2022 1332   GFRNONAA >60 05/03/2022 0644   GFRNONAA 61 01/09/2020 1404   GFRAA 70 01/09/2020 1404    BNP    Component Value Date/Time   BNP 153.2 (H) 04/09/2022 1100    ProBNP No results found for: "PROBNP"  Imaging: No results found.   Assessment & Plan:   Mild sleep apnea - Sleep study in May 2024 showed very mild OSA, AHI 6.9 (mean oxygen 92%). She is minimally symptomatic, denies excessive daytime sleepiness. Reviewed sleep study results today, risks of untreated sleep apnea and treatment options. She does not want to be started on CPAP, she did not tolerated when admitted to the hospital in February. Respect her decision, recommend she discuss with cardiology to make sure that this is all right with them due to her cardiac history. Advised patient if experiencing excessive daytime sleepiness and falling asleep easily when in active she should re-consider CPAP  at that time. Encourage weight loss and focus on side sleeping position. FU in 6 months or sooner.      Glenford Bayley, NP 12/25/2022

## 2022-12-21 ENCOUNTER — Encounter: Payer: Self-pay | Admitting: Endocrinology

## 2022-12-21 ENCOUNTER — Other Ambulatory Visit (HOSPITAL_COMMUNITY): Payer: Self-pay

## 2022-12-21 ENCOUNTER — Ambulatory Visit (INDEPENDENT_AMBULATORY_CARE_PROVIDER_SITE_OTHER): Payer: Medicare PPO | Admitting: Endocrinology

## 2022-12-21 VITALS — BP 130/80 | HR 96 | Ht 62.0 in | Wt 161.2 lb

## 2022-12-21 DIAGNOSIS — E1165 Type 2 diabetes mellitus with hyperglycemia: Secondary | ICD-10-CM

## 2022-12-21 DIAGNOSIS — Z794 Long term (current) use of insulin: Secondary | ICD-10-CM

## 2022-12-21 LAB — POCT GLYCOSYLATED HEMOGLOBIN (HGB A1C): Hemoglobin A1C: 15 % (ref 4.0–5.6)

## 2022-12-21 MED ORDER — NOVOLOG FLEXPEN 100 UNIT/ML ~~LOC~~ SOPN
15.0000 [IU] | PEN_INJECTOR | Freq: Three times a day (TID) | SUBCUTANEOUS | 4 refills | Status: DC
  Filled 2022-12-21: qty 30, 66d supply, fill #0
  Filled 2023-02-18: qty 30, 66d supply, fill #1
  Filled 2023-04-13 – 2023-04-14 (×2): qty 30, 66d supply, fill #2
  Filled 2023-06-30: qty 30, 66d supply, fill #3
  Filled 2023-09-05: qty 30, 66d supply, fill #4

## 2022-12-21 MED ORDER — GLIMEPIRIDE 2 MG PO TABS
2.0000 mg | ORAL_TABLET | Freq: Every day | ORAL | 3 refills | Status: DC
  Filled 2022-12-21: qty 90, 90d supply, fill #0
  Filled 2023-03-14: qty 90, 90d supply, fill #1
  Filled 2023-06-13: qty 90, 90d supply, fill #2
  Filled 2023-09-12: qty 90, 90d supply, fill #3

## 2022-12-21 MED ORDER — INSULIN GLARGINE 100 UNIT/ML SOLOSTAR PEN
20.0000 [IU] | PEN_INJECTOR | Freq: Two times a day (BID) | SUBCUTANEOUS | 11 refills | Status: DC
  Filled 2022-12-21 – 2023-01-20 (×11): qty 15, 38d supply, fill #0
  Filled 2023-02-21: qty 15, 38d supply, fill #1
  Filled 2023-03-30: qty 15, 38d supply, fill #2
  Filled 2023-05-09: qty 15, 38d supply, fill #3
  Filled 2023-06-13: qty 15, 38d supply, fill #4
  Filled 2023-07-18: qty 15, 38d supply, fill #5
  Filled 2023-08-24: qty 15, 38d supply, fill #6
  Filled 2023-10-03: qty 15, 38d supply, fill #7
  Filled 2023-11-10: qty 15, 38d supply, fill #8
  Filled 2023-12-19: qty 15, 38d supply, fill #9

## 2022-12-21 NOTE — Patient Instructions (Signed)
Increase lantus to 20 units two times a day.  Start Novolog 15 units with meals three times a day.  Start glimepiride 2mg  daily.  Bring your meter in follow up visit.  Check glucose 4 times a day.

## 2022-12-21 NOTE — Progress Notes (Signed)
Outpatient Endocrinology Note [place] Tudor Chandley, MD  12/21/22  Patient's Name: Deborah Neal    DOB: 08-05-1950    MRN: [id number]                                                    REASON OF VISIT: Type 2 diabetes mellitus  REFERRING PROVIDER: Corwin Levins, MD  PCP: Corwin Levins, MD  HISTORY OF PRESENT ILLNESS:   Deborah Neal is a 72 y.o. old female with past medical history listed below, is here for evaluation of type 2 diabetes mellitus.   Pertinent Diabetes History: Patient was diagnosed with type 2 diabetes mellitus several years ago, she was initially treated with metformin and insulin therapy was added in 2018.  She had taken.  Types of insulin in the past including premixed insulin.  She mostly had poor control of diabetes mellitus.  He was not on GLP-1 receptor agonist.  She was last seen by Dr. Lucianne Muss in this clinic in May 2022 and then she lost endocrinology follow-up.  She was following with primary care provider, with uncontrolled diabetes, referred back to endocrinology for further evaluation and management.  In 2022 patient was on Humulin R U-500 30 units in the morning, 30 units with lunch and 60 units at dinner along with Toujeo 100 units daily.  She was on Synjardy extended release 5/thousand 2 tablets daily.  Uncontrolled/poorly controlled diabetes mellitus for several years.  In 2022 she had relatively better control with lowest hemoglobin A1c of 7.4% after that she has hemoglobin A1c in the range of 14 to 15%.  Chronic Diabetes Complications : Retinopathy: no. Last ophthalmology exam was done on annually, reportedly.  Nephropathy: CKD III A, on losartan. Peripheral neuropathy: no Coronary artery disease: no Stroke: no  Relevant comorbidities and cardiovascular risk factors: Obesity: no Body mass index is 29.48 kg/m.  Hypertension: yes Hyperlipidemia. yes  Current / Home Diabetic regimen includes: Lantus 16 units two times a day.   Prior diabetic  medications: Metformin. Synjardy. U-500 insulin, premixed insulin and Toujeo in the past.  Glycemic data:   No glucometer data to review.  She forgot to bring the glucometer today.  She reports she has been taking 4 times a day part however she only recalled glucose, performed today which was 373.  Hypoglycemia: Patient has no hypoglycemic episodes. Patient has hypoglycemia awareness, has a glucagon ER kit and diabetic alert device.   Factors modifying glucose control: 1.  Diabetic diet assessment: 3 meals a day.  Reports frequent snacking during the afternoon and at bedtime.  She also drinks sodas including Pepsi, ginger ale.  2.  Staying active or exercising: No formal exercise.  3.  Medication compliance: compliant most of the time.  Interval history 12/21/22 Patient was last seen by Dr. Lucianne Muss in May 2022, see lost follow-up with endocrinology.  Patient reports no specific reason however she stopped coming to this clinic.  She has poorly controlled diabetes mellitus with hemoglobin A1c of more than 14% in May and hemoglobin A1c of more than 15% in the clinic today.  She has been taking Lantus 16 units 2 times a day.  She reports compliance with insulin.   REVIEW OF SYSTEMS As per history of present illness.   PAST MEDICAL HISTORY: Past Medical History:  Diagnosis Date   Acute  massive pulmonary embolism (HCC)    ALLERGIC RHINITIS 01/29/2007   Aortic atherosclerosis (HCC)    Arthritis    ASTHMA 01/29/2007   ASTHMA, WITH ACUTE EXACERBATION 06/24/2008   BACK PAIN 01/29/2009   Cardiac arrest [facility])    CHEST PAIN 06/24/2008   Coronary artery calcification seen on CT scan    Family history of adverse reaction to anesthesia    Patients daughter has N/V after anesthesia   GERD (gastroesophageal reflux disease)    History of shingles    HYPERLIPIDEMIA 01/29/2007   HYPERTENSION 01/29/2007   OSTEOPENIA 10/16/2007   Type II or unspecified type diabetes mellitus without mention of  complication, uncontrolled 11/29/2013    PAST SURGICAL HISTORY: Past Surgical History:  Procedure Laterality Date   CHOLECYSTECTOMY N/A 09/19/2015   Procedure: LAPAROSCOPIC CHOLECYSTECTOMY WITH INTRAOPERATIVE CHOLANGIOGRAM;  Surgeon: Avel Peace, MD;  Location: [facility] OR;  Service: General;  Laterality: N/A;   COLONOSCOPY     TONSILLECTOMY  1956   VIDEO BRONCHOSCOPY Bilateral 03/28/2014   Procedure: VIDEO BRONCHOSCOPY WITHOUT FLUORO;  Surgeon: Nyoka Cowden, MD;  Location: [facility] ENDOSCOPY;  Service: Cardiopulmonary;  Laterality: Bilateral;    ALLERGIES: Allergies  Allergen Reactions   Shingrix [Zoster Vac Recomb Adjuvanted]     Allergy to original live zoster - rash    FAMILY HISTORY:  Family History  Problem Relation Age of Onset   Cancer Mother        ? type    Diabetes Father    Hypertension Father    Heart disease Father    Asthma Father    Asthma Daughter    Colon cancer Neg Hx    Esophageal cancer Neg Hx    Rectal cancer Neg Hx    Stomach cancer Neg Hx    Breast cancer Neg Hx     SOCIAL HISTORY: Social History   Socioeconomic History   Marital status: Single    Spouse name: Not on file   Number of children: Not on file   Years of education: Not on file   Highest education level: Not on file  Occupational History   Not on file  Tobacco Use   Smoking status: Former    Current packs/day: 0.00    Types: Cigarettes    Quit date: 09/07/2003    Years since quitting: 19.3    Passive exposure: Past   Smokeless tobacco: Never  Vaping Use   Vaping status: Never Used  Substance and Sexual Activity   Alcohol use: No   Drug use: No   Sexual activity: Never  Other Topics Concern   Not on file  Social History Narrative   Not on file   Social Determinants of Health   Financial Resource Strain: Low Risk  (11/09/2022)   Overall Financial Resource Strain (CARDIA)    Difficulty of Paying Living Expenses: Not hard at all  Food Insecurity: No Food Insecurity  (11/09/2022)   Hunger Vital Sign    Worried About Running Out of Food in the Last Year: Never true    Ran Out of Food in the Last Year: Never true  Transportation Needs: No Transportation Needs (11/09/2022)   PRAPARE - Administrator, Civil Service (Medical): No    Lack of Transportation (Non-Medical): No  Physical Activity: Insufficiently Active (11/09/2022)   Exercise Vital Sign    Days of Exercise per Week: 7 days    Minutes of Exercise per Session: 20 min  Stress: No Stress Concern Present (11/09/2022)  [employer] of Occupational Health - Occupational Stress Questionnaire    Feeling of Stress : Not at all  Social Connections: Moderately Integrated (11/09/2022)   Social Connection and Isolation Panel [NHANES]    Frequency of Communication with Friends and Family: More than three times a week    Frequency of Social Gatherings with Friends and Family: More than three times a week    Attends Religious Services: More than 4 times per year    Active Member of [employer] or Organizations: Yes    Attends Engineer, structural: More than 4 times per year    Marital Status: Never married    MEDICATIONS:  Current Outpatient Medications  Medication Sig Dispense Refill   Accu-Chek Softclix Lancets lancets Use as directed up to four times daily as directed 100 each 0   acetaminophen (TYLENOL) 325 MG tablet Take 1-2 tablets (325-650 mg total) by mouth every 4 (four) hours as needed for mild pain.     amLODipine (NORVASC) 2.5 MG tablet Take 3 tablets (7.5 mg total) by mouth daily. 90 tablet 3   apixaban (ELIQUIS) 5 MG TABS tablet Take 1 tablet (5 mg total) by mouth 2 (two) times daily. 60 tablet 4   blood glucose meter kit and supplies Use up to four times daily as directed. 1 each 0   furosemide (LASIX) 20 MG tablet Take 1 tablet (20 mg total) by mouth daily. 90 tablet 3   glimepiride (AMARYL) 2 MG tablet Take 1 tablet (2 mg total) by mouth daily before breakfast. 90 tablet 3    glucose blood test strip Use as directed up to four times daily as directed 400 each 3   HYDROcodone-acetaminophen (NORCO/VICODIN) 5-325 MG tablet Take 1 tablet by mouth every 4 (four) hours as needed for moderate pain. 20 tablet 0   insulin aspart (NOVOLOG FLEXPEN) 100 UNIT/ML FlexPen Inject 15 Units into the skin 3 (three) times daily with meals. 30 mL 4   Insulin Pen Needle (CAREFINE PEN NEEDLES) 32G X 4 MM MISC Use to inject insulin 4 times daily. 100 each 0   levothyroxine (SYNTHROID) 75 MCG tablet Take 1 tablet (75 mcg total) by mouth daily at 6 (six) AM. 90 tablet 3   losartan (COZAAR) 25 MG tablet Take 1 tablet (25 mg total) by mouth daily. 90 tablet 3   Multiple Vitamin (MULTIVITAMIN WITH MINERALS) TABS tablet Take 1 tablet by mouth daily.     potassium chloride SA (KLOR-CON M) 20 MEQ tablet Take 1 tablet (20 mEq total) by mouth daily. 90 tablet 3   rosuvastatin (CRESTOR) 20 MG tablet Take 1 tablet (20 mg total) by mouth daily. 90 tablet 3   insulin glargine (LANTUS) 100 UNIT/ML Solostar Pen Inject 20 Units into the skin 2 (two) times daily. 15 mL 11   lidocaine (LIDODERM) 5 % Place 3 patches onto the skin daily. Remove & Discard patch within 12 hours or as directed by MD (Patient not taking: Reported on 12/21/2022) 30 patch 0   magnesium oxide (MAG-OX) 400 MG tablet Take 1 tablet (400 mg total) by mouth daily. (Patient not taking: Reported on 12/20/2022) 90 tablet 3   No current facility-administered medications for this visit.    PHYSICAL EXAM: Vitals:   12/21/22 1045  BP: 130/80  Pulse: 96  SpO2: 96%  Weight: 161 lb 3.2 oz (73.1 kg)  Height: 5\' 2"  (1.575 m)   Body mass index is 29.48 kg/m.  Wt Readings from Last 3 Encounters:  12/21/22  161 lb 3.2 oz (73.1 kg)  12/20/22 161 lb 6.4 oz (73.2 kg)  11/09/22 159 lb (72.1 kg)    General: Well developed, well nourished female in no apparent distress.  HEENT: AT/[place], no external lesions.  Eyes: Conjunctiva clear and no  icterus. Neck: Neck supple  Lungs: Respirations not labored Neurologic: Alert, oriented, normal speech Extremities / Skin: Dry. No sores or rashes noted.  Psychiatric: Does not appear depressed or anxious  Diabetic Foot Exam - Simple   No data filed    LABS Reviewed Lab Results  Component Value Date   HGBA1C 15.0 (>) 12/21/2022   HGBA1C >14.0 (H) 09/03/2022   HGBA1C >15.5 (H) 04/12/2022   Lab Results  Component Value Date   FRUCTOSAMINE 443 (H) 02/29/2020   FRUCTOSAMINE 292 (H) 09/20/2019   FRUCTOSAMINE 277 02/12/2019   Lab Results  Component Value Date   CHOL 153 09/03/2022   HDL 51.20 09/03/2022   LDLCALC 82 09/03/2022   LDLDIRECT 153.4 01/29/2009   TRIG 98.0 09/03/2022   CHOLHDL 3 09/03/2022   Lab Results  Component Value Date   MICRALBCREAT 1.6 09/03/2022   MICRALBCREAT 1.8 07/22/2021   Lab Results  Component Value Date   CREATININE 0.96 09/03/2022   Lab Results  Component Value Date   GFR 59.66 (L) 09/03/2022    ASSESSMENT / PLAN  1. Uncontrolled type 2 diabetes mellitus with hyperglycemia, with long-term current use of insulin (HCC)     Diabetes Mellitus type 2, complicated by CKD. - Diabetic status / severity: Poorly controlled  Lab Results  Component Value Date   HGBA1C 15.0 (>) 12/21/2022    - Hemoglobin A1c goal : <7%  Discussed about compliance with diabetic medication.  Discussed that diet is very important and advised to avoid frequent snacks and soft drinks.  Patient requires significantly high dose of insulin in the past, as mentioned in HPI when she was following with endocrinology in 2022.  Adjusted diabetes regimen as follows.  No glucose data is available.  - Medications:   I) increase Lantus from 16 to 20 units 2 times a day. II) start NovoLog 15 units with meals 3 times a day. III) start glimepiride 2 mg daily.  - Home glucose testing: 4 times a day before meals and bedtime.  Patient is advised to bring glucometer in the  follow-up visit. - Discussed/ Gave Hypoglycemia treatment plan.  # Consult : not required at this time.   # Annual urine for microalbuminuria/ creatinine ratio, no microalbuminuria currently, continue ACE/ARB losartan. Last  Lab Results  Component Value Date   MICRALBCREAT 1.6 09/03/2022    # Foot check nightly.  # Annual dilated diabetic eye exams.  Advised to have diabetic eye exam.  - Diet: Make healthy diabetic food choices.  Patient is not sure if she can make diet changes. - Life style / activity / exercise: Discussed.  2. Blood pressure  -  BP Readings from Last 1 Encounters:  12/21/22 130/80    - Control is in target.  - No change in current plans.  3. Lipid status / Hyperlipidemia - Last  Lab Results  Component Value Date   LDLCALC 82 09/03/2022   - Continue rosuvastatin 20 mg daily.  # She has hypothyroidism currently on levothyroxine, had normal thyroid function test in January 2024 and May 2024.  Managed by primary care provider.  Diagnoses and all orders for this visit:  Uncontrolled type 2 diabetes mellitus with hyperglycemia, with long-term current use of  insulin (HCC) -     POCT glycosylated hemoglobin (Hb A1C)  Other orders -     insulin aspart (NOVOLOG FLEXPEN) 100 UNIT/ML FlexPen; Inject 15 Units into the skin 3 (three) times daily with meals. -     insulin glargine (LANTUS) 100 UNIT/ML Solostar Pen; Inject 20 Units into the skin 2 (two) times daily. -     glimepiride (AMARYL) 2 MG tablet; Take 1 tablet (2 mg total) by mouth daily before breakfast.    DISPOSITION Follow up in clinic in 6 weeks suggested.   All questions answered and patient verbalized understanding of the plan.  [place] Nakira Litzau, MD [facility] Endocrinology [facility] [address] [place], [address] [place], [place] [address] Phone # [phone]  At least part of this note was generated using voice recognition software. Inadvertent word errors may have occurred, which  were not recognized during the proofreading process.

## 2022-12-22 ENCOUNTER — Other Ambulatory Visit (HOSPITAL_COMMUNITY): Payer: Self-pay

## 2022-12-23 ENCOUNTER — Other Ambulatory Visit (HOSPITAL_COMMUNITY): Payer: Self-pay

## 2022-12-24 ENCOUNTER — Other Ambulatory Visit (HOSPITAL_COMMUNITY): Payer: Self-pay

## 2022-12-25 DIAGNOSIS — G4730 Sleep apnea, unspecified: Secondary | ICD-10-CM | POA: Insufficient documentation

## 2022-12-25 NOTE — Assessment & Plan Note (Signed)
-   Sleep study in May 2024 showed very mild OSA, AHI 6.9 (mean oxygen 92%). She is minimally symptomatic, denies excessive daytime sleepiness. Reviewed sleep study results today, risks of untreated sleep apnea and treatment options. She does not want to be started on CPAP, she did not tolerated when admitted to the hospital in February. Respect her decision, recommend she discuss with cardiology to make sure that this is all right with them due to her cardiac history. Advised patient if experiencing excessive daytime sleepiness and falling asleep easily when in active she should re-consider CPAP at that time. Encourage weight loss and focus on side sleeping position. FU in 6 months or sooner.

## 2022-12-28 ENCOUNTER — Other Ambulatory Visit: Payer: Self-pay

## 2022-12-28 ENCOUNTER — Ambulatory Visit: Payer: Self-pay

## 2022-12-28 NOTE — Patient Instructions (Signed)
Visit Information  Thank you for taking time to visit with me today. Please don't hesitate to contact me if I can be of assistance to you.   Following are the goals we discussed today:  Take medications as prescribed Attend provider visits as scheduled Continue to work on decreasing concentrated sweets/sodas and eating healthy Strive to be consistent with exercise-walking around the house at least two times a day.  Our next appointment is by telephone on 01/27/23 at 1:00 pm  Please call the care guide team at [phone] if you need to cancel or reschedule your appointment.   If you are experiencing a Mental Health or Behavioral Health Crisis or need someone to talk to, please call the Suicide and Crisis Lifeline: 988 call the [place] National Suicide Prevention Lifeline: 249-135-6175 or TTY: 801 705 7912 TTY (405) 229-4181) to talk to a trained counselor call 1-800-273-TALK (toll free, 24 hour hotline)  Kathyrn Sheriff, RN, MSN, BSN, CCM Care Management Coordinator [phone]

## 2022-12-28 NOTE — Patient Outreach (Signed)
  Care Coordination   Follow Up Visit Note   12/28/2022 Name: Deborah Neal MRN: [id number] DOB: 23-Feb-1951  Deborah Neal is a 72 y.o. year old female who sees Corwin Levins, MD for primary care. I spoke with  Richelle Ito by phone today.  What matters to the patients health and wellness today?  Mrs. Woolbright reports her blood sugars have improved since visit to endocrinologist. She reports she has her medications and is taking as prescribed. She reports BS averaging 250's now. She states she has not received educational material at this time.  Goals Addressed             This Visit's Progress    Assist with Health management/education Diabetes       Interventions Today    Flowsheet Row Most Recent Value  Chronic Disease   Chronic disease during today's visit Diabetes  General Interventions   General Interventions Discussed/Reviewed General Interventions Reviewed  Doctor Visits Discussed/Reviewed Doctor Visits Reviewed, PCP  PCP/Specialist Visits Compliance with follow-up visit  [reviewed upcoming scheduled appointments]  Education Interventions   Education Provided Provided Education, Provided Printed Education  ["Diabetes and diet,  the ABCS of Diabetes and Type 2 Diabetes"]  Provided Verbal Education On Blood Sugar Monitoring, Medication, Foot Care, Exercise, Nutrition, When to see the doctor  [reviewed patient instructions per endocrinology visit(12/21/22).  discussed role of exercise in diabetes, encouraged continue to decrease concentrated sweets, decrease soda intake, take mediccations as prescribed, monitor BS]  Labs Reviewed Hgb A1c  Nutrition Interventions   Nutrition Discussed/Reviewed Nutrition Reviewed  Pharmacy Interventions   Pharmacy Dicussed/Reviewed Pharmacy Topics Reviewed            SDOH assessments and interventions completed:  No  Care Coordination Interventions:  Yes, provided   Follow up plan: Follow up call scheduled for 01/27/23    Encounter Outcome:   Pt. Visit Completed   Kathyrn Sheriff, RN, MSN, BSN, CCM Care Management Coordinator [phone]

## 2022-12-29 ENCOUNTER — Other Ambulatory Visit (HOSPITAL_COMMUNITY): Payer: Self-pay

## 2022-12-29 ENCOUNTER — Other Ambulatory Visit: Payer: Self-pay

## 2022-12-30 ENCOUNTER — Other Ambulatory Visit (HOSPITAL_COMMUNITY): Payer: Self-pay

## 2022-12-31 ENCOUNTER — Other Ambulatory Visit (HOSPITAL_COMMUNITY): Payer: Self-pay

## 2023-01-07 ENCOUNTER — Other Ambulatory Visit (HOSPITAL_COMMUNITY): Payer: Self-pay

## 2023-01-10 ENCOUNTER — Ambulatory Visit (INDEPENDENT_AMBULATORY_CARE_PROVIDER_SITE_OTHER): Payer: Medicare HMO | Admitting: Pulmonary Disease

## 2023-01-10 ENCOUNTER — Encounter: Payer: Self-pay | Admitting: Pulmonary Disease

## 2023-01-10 VITALS — BP 122/66 | HR 82 | Ht 63.0 in | Wt 169.8 lb

## 2023-01-10 DIAGNOSIS — D61818 Other pancytopenia: Secondary | ICD-10-CM | POA: Diagnosis not present

## 2023-01-10 DIAGNOSIS — D696 Thrombocytopenia, unspecified: Secondary | ICD-10-CM

## 2023-01-10 DIAGNOSIS — R0683 Snoring: Secondary | ICD-10-CM | POA: Diagnosis not present

## 2023-01-10 DIAGNOSIS — G4730 Sleep apnea, unspecified: Secondary | ICD-10-CM | POA: Diagnosis not present

## 2023-01-10 NOTE — Patient Instructions (Signed)
I will see you about a year from now  If you know you will not be able to get back to using CPAP Then focus on weight loss as losing weight will help the mild sleep apnea you have  Call us with significant concerns

## 2023-01-10 NOTE — Progress Notes (Signed)
Deborah Neal    [id number]    31-Aug-1950  Primary Care Physician:John, Len Blalock, MD  Referring Physician: Corwin Levins, MD [address] [place],  [place] [address]  Chief complaint:   Patient being seen for follow-up recent hospitalization History of mild obstructive sleep apnea  HPI:  Mild obstructive sleep apnea  Has not been using CPAP  She feels well otherwise Does not feel she needs CPAP  She wakes up multiple times during the night usually gets about 2 to 3 hours of sleep and then wakes up then falls back asleep later on in the morning Does take daytime naps  Recent history significant for cardiac arrest December 2023 Was not having any breathing problems or acute issues prior to event she remembers feeling well prior to event it was when she was not able to be contacted that family alerted EMS She was later found unresponsive with some emesis around her Noted to about a seizure by EMS, had to have CPR, lasted less than 5 minutes  Transported to the hospital, intubated  Had V-fib V. tach arrest in the setting of severe hypokalemia in the hospital It was presumed that initial event may have been related to massive pulmonary embolism prior to hospitalization, did receive TNKase Lower extremity ultrasound during the hospitalization was negative for DVT, echo was suggestive of clot in transit CT PE protocol with PE with very small thrombus burden on 12/28 Was treated for Serratia pneumonia with bilateral infiltrates  Pulmonary pressures was elevated during hospitalization-may have been due to acute illness  Extubated after about 8 days  Does not recollect having significant problems prior to event  She has continued to improve Able to tolerate activities of daily living  She quit smoking about 20 years ago, smoked about 1 pack a day   Outpatient Encounter Medications as of 01/10/2023  Medication Sig   Accu-Chek Softclix Lancets lancets Use as  directed up to four times daily as directed   acetaminophen (TYLENOL) 325 MG tablet Take 1-2 tablets (325-650 mg total) by mouth every 4 (four) hours as needed for mild pain.   amLODipine (NORVASC) 2.5 MG tablet Take 3 tablets (7.5 mg total) by mouth daily.   apixaban (ELIQUIS) 5 MG TABS tablet Take 1 tablet (5 mg total) by mouth 2 (two) times daily.   blood glucose meter kit and supplies Use up to four times daily as directed.   furosemide (LASIX) 20 MG tablet Take 1 tablet (20 mg total) by mouth daily.   glimepiride (AMARYL) 2 MG tablet Take 1 tablet (2 mg total) by mouth daily before breakfast.   glucose blood test strip Use as directed up to four times daily as directed   HYDROcodone-acetaminophen (NORCO/VICODIN) 5-325 MG tablet Take 1 tablet by mouth every 4 (four) hours as needed for moderate pain.   insulin aspart (NOVOLOG FLEXPEN) 100 UNIT/ML FlexPen Inject 15 Units into the skin 3 (three) times daily with meals.   insulin glargine (LANTUS) 100 UNIT/ML Solostar Pen Inject 20 Units into the skin 2 (two) times daily.   Insulin Pen Needle (CAREFINE PEN NEEDLES) 32G X 4 MM MISC Use to inject insulin 4 times daily.   levothyroxine (SYNTHROID) 75 MCG tablet Take 1 tablet (75 mcg total) by mouth daily at 6 (six) AM.   lidocaine (LIDODERM) 5 % Place 3 patches onto the skin daily. Remove & Discard patch within 12 hours or as directed by MD  losartan (COZAAR) 25 MG tablet Take 1 tablet (25 mg total) by mouth daily.   magnesium oxide (MAG-OX) 400 MG tablet Take 1 tablet (400 mg total) by mouth daily.   Multiple Vitamin (MULTIVITAMIN WITH MINERALS) TABS tablet Take 1 tablet by mouth daily.   potassium chloride SA (KLOR-CON M) 20 MEQ tablet Take 1 tablet (20 mEq total) by mouth daily.   rosuvastatin (CRESTOR) 20 MG tablet Take 1 tablet (20 mg total) by mouth daily.   [DISCONTINUED] lisdexamfetamine (VYVANSE) 70 MG capsule Take 1 capsule (70 mg total) by mouth daily.   No facility-administered  encounter medications on file as of 01/10/2023.    Allergies as of 01/10/2023 - Review Complete 01/10/2023  Allergen Reaction Noted   Shingrix [zoster vac recomb adjuvanted]  01/22/2022    Past Medical History:  Diagnosis Date   Acute massive pulmonary embolism (HCC)    ALLERGIC RHINITIS 01/29/2007   Aortic atherosclerosis (HCC)    Arthritis    ASTHMA 01/29/2007   ASTHMA, WITH ACUTE EXACERBATION 06/24/2008   BACK PAIN 01/29/2009   Cardiac arrest [facility])    CHEST PAIN 06/24/2008   Coronary artery calcification seen on CT scan    Family history of adverse reaction to anesthesia    Patients daughter has N/V after anesthesia   GERD (gastroesophageal reflux disease)    History of shingles    HYPERLIPIDEMIA 01/29/2007   HYPERTENSION 01/29/2007   OSTEOPENIA 10/16/2007   Type II or unspecified type diabetes mellitus without mention of complication, uncontrolled 11/29/2013    Past Surgical History:  Procedure Laterality Date   CHOLECYSTECTOMY N/A 09/19/2015   Procedure: LAPAROSCOPIC CHOLECYSTECTOMY WITH INTRAOPERATIVE CHOLANGIOGRAM;  Surgeon: Avel Peace, MD;  Location: [facility] OR;  Service: General;  Laterality: N/A;   COLONOSCOPY     TONSILLECTOMY  1956   VIDEO BRONCHOSCOPY Bilateral 03/28/2014   Procedure: VIDEO BRONCHOSCOPY WITHOUT FLUORO;  Surgeon: Nyoka Cowden, MD;  Location: [facility] ENDOSCOPY;  Service: Cardiopulmonary;  Laterality: Bilateral;    Family History  Problem Relation Age of Onset   Cancer Mother        ? type    Diabetes Father    Hypertension Father    Heart disease Father    Asthma Father    Asthma Daughter    Colon cancer Neg Hx    Esophageal cancer Neg Hx    Rectal cancer Neg Hx    Stomach cancer Neg Hx    Breast cancer Neg Hx     Social History   Socioeconomic History   Marital status: Single    Spouse name: Not on file   Number of children: Not on file   Years of education: Not on file   Highest education level: Not on file  Occupational History    Not on file  Tobacco Use   Smoking status: Former    Current packs/day: 0.00    Types: Cigarettes    Quit date: 09/07/2003    Years since quitting: 19.3    Passive exposure: Past   Smokeless tobacco: Never  Vaping Use   Vaping status: Never Used  Substance and Sexual Activity   Alcohol use: No   Drug use: No   Sexual activity: Never  Other Topics Concern   Not on file  Social History Narrative   Not on file   Social Determinants of Health   Financial Resource Strain: Low Risk  (11/09/2022)   Overall Financial Resource Strain (CARDIA)    Difficulty of Paying Living Expenses: Not  hard at all  Food Insecurity: No Food Insecurity (11/09/2022)   Hunger Vital Sign    Worried About Running Out of Food in the Last Year: Never true    Ran Out of Food in the Last Year: Never true  Transportation Needs: No Transportation Needs (11/09/2022)   PRAPARE - Administrator, Civil Service (Medical): No    Lack of Transportation (Non-Medical): No  Physical Activity: Insufficiently Active (11/09/2022)   Exercise Vital Sign    Days of Exercise per Week: 7 days    Minutes of Exercise per Session: 20 min  Stress: No Stress Concern Present (11/09/2022)   [employer] of Occupational Health - Occupational Stress Questionnaire    Feeling of Stress : Not at all  Social Connections: Moderately Integrated (11/09/2022)   Social Connection and Isolation Panel [NHANES]    Frequency of Communication with Friends and Family: More than three times a week    Frequency of Social Gatherings with Friends and Family: More than three times a week    Attends Religious Services: More than 4 times per year    Active Member of [employer] or Organizations: Yes    Attends Banker Meetings: More than 4 times per year    Marital Status: Never married  Intimate Partner Violence: Not At Risk (11/09/2022)   Humiliation, Afraid, Rape, and Kick questionnaire    Fear of Current or Ex-Partner: No     Emotionally Abused: No    Physically Abused: No    Sexually Abused: No    Review of Systems  Respiratory:  Positive for shortness of breath.     Vitals:   01/10/23 1457  BP: 122/66  Pulse: 82  SpO2: 96%     Physical Exam Constitutional:      Appearance: She is obese.  HENT:     Head: Normocephalic.     Mouth/Throat:     Mouth: Mucous membranes are moist.  Eyes:     General: No scleral icterus. Cardiovascular:     Rate and Rhythm: Normal rate and regular rhythm.     Heart sounds: No murmur heard.    No friction rub.  Pulmonary:     Effort: No respiratory distress.     Breath sounds: No stridor. No wheezing or rhonchi.  Musculoskeletal:     Cervical back: No rigidity or tenderness.  Neurological:     Mental Status: She is alert.  Psychiatric:        Mood and Affect: Mood normal.      Data Reviewed: Recent discharge records reviewed  CT scan was reviewed with the patient showing significant improvement in infiltrative process in the upper lobes bilaterally  Mild obstructive sleep apnea  Assessment:  Recent cardiorespiratory arrest  History of diabetes  History of mild obstructive sleep apnea -Not using CPAP  Resolved multilobar pneumonia  Pulmonary hypertension on recent echocardiogram -Most recent echocardiogram showing normal pressures  History of pulmonary embolism  Plan/Recommendations:  Encouraged to focus on weight loss  Encouraged exercise regularly  No significant changes to be made at present  Follow-up a year from now  Encouraged to call us with any significant concerns    Virl Diamond MD [facility] 01/10/2023, 3:08 PM  CC: Corwin Levins, MD

## 2023-01-11 ENCOUNTER — Encounter (HOSPITAL_COMMUNITY): Payer: Self-pay | Admitting: Emergency Medicine

## 2023-01-11 ENCOUNTER — Other Ambulatory Visit: Payer: Self-pay

## 2023-01-11 ENCOUNTER — Emergency Department (HOSPITAL_COMMUNITY): Payer: Medicare HMO

## 2023-01-11 ENCOUNTER — Emergency Department (HOSPITAL_COMMUNITY)
Admission: EM | Admit: 2023-01-11 | Discharge: 2023-01-12 | Disposition: A | Discharge status: Home / Self Care | Payer: Medicare HMO | Attending: Emergency Medicine | Admitting: Emergency Medicine

## 2023-01-11 DIAGNOSIS — W01198A Fall on same level from slipping, tripping and stumbling with subsequent striking against other object, initial encounter: Secondary | ICD-10-CM | POA: Diagnosis not present

## 2023-01-11 DIAGNOSIS — Z23 Encounter for immunization: Secondary | ICD-10-CM | POA: Insufficient documentation

## 2023-01-11 DIAGNOSIS — R000 Tachycardia, unspecified: Secondary | ICD-10-CM | POA: Diagnosis not present

## 2023-01-11 DIAGNOSIS — W19XXXA Unspecified fall, initial encounter: Secondary | ICD-10-CM

## 2023-01-11 DIAGNOSIS — S0121XA Laceration without foreign body of nose, initial encounter: Secondary | ICD-10-CM | POA: Diagnosis not present

## 2023-01-11 DIAGNOSIS — S0240DA Maxillary fracture, left side, initial encounter for closed fracture: Secondary | ICD-10-CM | POA: Insufficient documentation

## 2023-01-11 DIAGNOSIS — S02401A Maxillary fracture, unspecified, initial encounter for closed fracture: Secondary | ICD-10-CM | POA: Diagnosis not present

## 2023-01-11 DIAGNOSIS — S0240CA Maxillary fracture, right side, initial encounter for closed fracture: Secondary | ICD-10-CM | POA: Insufficient documentation

## 2023-01-11 DIAGNOSIS — S01511A Laceration without foreign body of lip, initial encounter: Secondary | ICD-10-CM | POA: Diagnosis not present

## 2023-01-11 DIAGNOSIS — S022XXA Fracture of nasal bones, initial encounter for closed fracture: Secondary | ICD-10-CM | POA: Diagnosis not present

## 2023-01-11 DIAGNOSIS — Z7901 Long term (current) use of anticoagulants: Secondary | ICD-10-CM | POA: Insufficient documentation

## 2023-01-11 DIAGNOSIS — R519 Headache, unspecified: Secondary | ICD-10-CM | POA: Diagnosis not present

## 2023-01-11 DIAGNOSIS — R0602 Shortness of breath: Secondary | ICD-10-CM | POA: Diagnosis not present

## 2023-01-11 DIAGNOSIS — R918 Other nonspecific abnormal finding of lung field: Secondary | ICD-10-CM | POA: Diagnosis not present

## 2023-01-11 DIAGNOSIS — S0993XA Unspecified injury of face, initial encounter: Secondary | ICD-10-CM | POA: Diagnosis present

## 2023-01-11 DIAGNOSIS — M47812 Spondylosis without myelopathy or radiculopathy, cervical region: Secondary | ICD-10-CM | POA: Diagnosis not present

## 2023-01-11 MED ORDER — LIDOCAINE-EPINEPHRINE-TETRACAINE (LET) TOPICAL GEL
3.0000 mL | Freq: Once | TOPICAL | Status: AC
  Administered 2023-01-11: 3 mL via TOPICAL
  Filled 2023-01-11: qty 3

## 2023-01-11 MED ORDER — FENTANYL CITRATE PF 50 MCG/ML IJ SOSY
50.0000 ug | PREFILLED_SYRINGE | Freq: Once | INTRAMUSCULAR | Status: AC
  Administered 2023-01-11: 50 ug via INTRAVENOUS
  Filled 2023-01-11: qty 1

## 2023-01-11 MED ORDER — LIDOCAINE-EPINEPHRINE (PF) 2 %-1:200000 IJ SOLN
10.0000 mL | Freq: Once | INTRAMUSCULAR | Status: AC
  Administered 2023-01-11: 10 mL via INTRADERMAL
  Filled 2023-01-11: qty 20

## 2023-01-11 MED ORDER — TETANUS-DIPHTH-ACELL PERTUSSIS 5-2.5-18.5 LF-MCG/0.5 IM SUSY
0.5000 mL | PREFILLED_SYRINGE | Freq: Once | INTRAMUSCULAR | Status: AC
  Administered 2023-01-11: 0.5 mL via INTRAMUSCULAR
  Filled 2023-01-11: qty 0.5

## 2023-01-11 NOTE — ED Provider Notes (Incomplete)
[place] EMERGENCY DEPARTMENT AT [facility] Provider Note   CSN: [id number] Arrival date & time: 01/11/23  1959     History {Add pertinent medical, surgical, social history, OB history to HPI:1} Chief Complaint  Patient presents with   Deborah Neal is a 72 y.o. female.  The history is provided by the patient and medical records. No language interpreter was used.  Fall This is a new problem. The current episode started less than 1 hour ago. The problem has not changed since onset.Associated symptoms include headaches and shortness of breath (resolved now). Pertinent negatives include no chest pain and no abdominal pain. Nothing aggravates the symptoms. Nothing relieves the symptoms. She has tried nothing for the symptoms. The treatment provided no relief.       Home Medications Prior to Admission medications   Medication Sig Start Date End Date Taking? Authorizing Provider  Accu-Chek Softclix Lancets lancets Use as directed up to four times daily as directed 05/04/22     acetaminophen (TYLENOL) 325 MG tablet Take 1-2 tablets (325-650 mg total) by mouth every 4 (four) hours as needed for mild pain. 05/03/22   Setzer, Lynnell Jude, PA-C  amLODipine (NORVASC) 2.5 MG tablet Take 3 tablets (7.5 mg total) by mouth daily. 09/23/22   Corwin Levins, MD  apixaban (ELIQUIS) 5 MG TABS tablet Take 1 tablet (5 mg total) by mouth 2 (two) times daily. 11/03/22   Corwin Levins, MD  blood glucose meter kit and supplies Use up to four times daily as directed. 05/03/22   Setzer, Lynnell Jude, PA-C  furosemide (LASIX) 20 MG tablet Take 1 tablet (20 mg total) by mouth daily. 05/05/22   Corwin Levins, MD  glimepiride (AMARYL) 2 MG tablet Take 1 tablet (2 mg total) by mouth daily before breakfast. 12/21/22   Thapa, [place], MD  glucose blood test strip Use as directed up to four times daily as directed 05/05/22   Corwin Levins, MD  HYDROcodone-acetaminophen (NORCO/VICODIN) 5-325 MG tablet Take 1 tablet by  mouth every 4 (four) hours as needed for moderate pain. 05/04/22 05/04/23  Setzer, Lynnell Jude, PA-C  insulin aspart (NOVOLOG FLEXPEN) 100 UNIT/ML FlexPen Inject 15 Units into the skin 3 (three) times daily with meals. 12/21/22   Thapa, [place], MD  insulin glargine (LANTUS) 100 UNIT/ML Solostar Pen Inject 20 Units into the skin 2 (two) times daily. 12/21/22   Thapa, [place], MD  Insulin Pen Needle (CAREFINE PEN NEEDLES) 32G X 4 MM MISC Use to inject insulin 4 times daily. 05/04/22   Setzer, Lynnell Jude, PA-C  levothyroxine (SYNTHROID) 75 MCG tablet Take 1 tablet (75 mcg total) by mouth daily at 6 (six) AM. 05/05/22   Corwin Levins, MD  lidocaine (LIDODERM) 5 % Place 3 patches onto the skin daily. Remove & Discard patch within 12 hours or as directed by MD 05/04/22   Valetta Fuller, Lynnell Jude, PA-C  losartan (COZAAR) 25 MG tablet Take 1 tablet (25 mg total) by mouth daily. 05/05/22   Corwin Levins, MD  magnesium oxide (MAG-OX) 400 MG tablet Take 1 tablet (400 mg total) by mouth daily. 06/10/22   Corwin Levins, MD  Multiple Vitamin (MULTIVITAMIN WITH MINERALS) TABS tablet Take 1 tablet by mouth daily. 05/03/22   Setzer, Lynnell Jude, PA-C  potassium chloride SA (KLOR-CON M) 20 MEQ tablet Take 1 tablet (20 mEq total) by mouth daily. 05/05/22   Corwin Levins, MD  rosuvastatin (CRESTOR) 20 MG  tablet Take 1 tablet (20 mg total) by mouth daily. 05/05/22   Corwin Levins, MD  lisdexamfetamine (VYVANSE) 70 MG capsule Take 1 capsule (70 mg total) by mouth daily. 07/05/22 07/30/22  Cottle, Steva Ready., MD      Allergies    Shingrix [zoster vac recomb adjuvanted]    Review of Systems   Review of Systems  Constitutional:  Negative for chills, fatigue and fever.  HENT:  Negative for congestion.   Eyes:  Negative for visual disturbance.  Respiratory:  Positive for shortness of breath (resolved now). Negative for cough, chest tightness and wheezing.   Cardiovascular:  Negative for chest pain and palpitations.  Gastrointestinal:  Negative for  abdominal pain, constipation, diarrhea, nausea and vomiting.  Genitourinary:  Negative for dysuria.  Musculoskeletal:  Negative for back pain and neck pain.  Skin:  Positive for wound.  Neurological:  Positive for headaches. Negative for weakness and numbness.  Psychiatric/Behavioral:  Negative for agitation.   All other systems reviewed and are negative.   Physical Exam Updated Vital Signs BP 127/73   Pulse (!) 122   Temp 99 F (37.2 C) (Oral)   Resp 20   SpO2 98%  Physical Exam Vitals and nursing note reviewed.  Constitutional:      General: She is not in acute distress.    Appearance: She is well-developed. She is not ill-appearing, toxic-appearing or diaphoretic.  HENT:     Nose: No congestion.     Mouth/Throat:     Mouth: Injury and lacerations present.      Comments: Lip laceration along the upper lip that does briefly cross the milium border.  It appears to be through and through.  Tenderness present.  Also some mild epistaxis bilaterally.  No nasal septal hematoma seen.  Tenderness throughout the nose and upper mouth area.  No lower jaw tenderness.  No tongue laceration seen.  Patient is missing upper teeth. Eyes:     Extraocular Movements: Extraocular movements intact.     Conjunctiva/sclera: Conjunctivae normal.     Pupils: Pupils are equal, round, and reactive to light.  Cardiovascular:     Rate and Rhythm: Regular rhythm. Tachycardia present.     Heart sounds: No murmur heard. Pulmonary:     Effort: Pulmonary effort is normal. No respiratory distress.     Breath sounds: Normal breath sounds. No wheezing, rhonchi or rales.  Chest:     Chest wall: No tenderness.  Abdominal:     General: Abdomen is flat.     Palpations: Abdomen is soft.     Tenderness: There is no abdominal tenderness. There is no guarding or rebound.  Musculoskeletal:        General: Tenderness present. No swelling.     Cervical back: Neck supple. No tenderness.  Skin:    General: Skin is  warm and dry.     Capillary Refill: Capillary refill takes less than 2 seconds.     Findings: No erythema or rash.  Neurological:     General: No focal deficit present.     Mental Status: She is alert.  Psychiatric:        Mood and Affect: Mood normal.     ED Results / Procedures / Treatments   Labs (all labs ordered are listed, but only abnormal results are displayed) Labs Reviewed - No data to display  EKG None  Radiology CT HEAD WO CONTRAST ( )  Result Date: 01/11/2023 CLINICAL DATA:  Trip and fall with facial  pain and headaches, initial encounter EXAM: CT HEAD WITHOUT CONTRAST CT MAXILLOFACIAL WITHOUT CONTRAST CT CERVICAL SPINE WITHOUT CONTRAST TECHNIQUE: Multidetector CT imaging of the head, cervical spine, and maxillofacial structures were performed using the standard protocol without intravenous contrast. Multiplanar CT image reconstructions of the cervical spine and maxillofacial structures were also generated. RADIATION DOSE REDUCTION: This exam was performed according to the departmental dose-optimization program which includes automated exposure control, adjustment of the mA and/or kV according to patient size and/or use of iterative reconstruction technique. COMPARISON:  04/09/2022 FINDINGS: CT HEAD FINDINGS Brain: No evidence of acute infarction, hemorrhage, hydrocephalus, extra-axial collection or mass lesion/mass effect. Chronic atrophic changes are noted. Vascular: No hyperdense vessel or unexpected calcification. Skull: Normal. Negative for fracture or focal lesion. Other: No scalp abnormality is noted. CT MAXILLOFACIAL FINDINGS Osseous: Mild motion artifact somewhat limits the examination. Despite the significant motion artifact there appear to be multiple fractures involving the maxillary antra and nasal septum. The orbits appear within normal limits. No definitive nasal bone fracture is seen. Orbits: Orbits and their contents are within normal limits. Sinuses: Paranasal  sinuses demonstrate air-fluid levels within the maxillary antra bilaterally as well as considerable mucosal thickening within the ethmoid sinuses related to the recent injury. Soft tissues: Surrounding soft tissue structures demonstrate no focal hematoma. CT CERVICAL SPINE FINDINGS Alignment: Loss of the normal cervical lordosis is noted which may be related to muscular spasm. Skull base and vertebrae: 7 cervical segments are well visualized. Mild osteophytic changes and facet hypertrophic changes are noted. No acute fracture or acute facet abnormality is noted. The odontoid is within limits. Soft tissues and spinal canal: Surrounding soft tissue structures are within normal limits. Upper chest: Visualized lung apices demonstrate apical scarring. Other: None IMPRESSION: CT of the head: No acute intracranial abnormality noted. Chronic atrophic changes. CT of the cervical spine: Multilevel degenerative change without acute abnormality. CT of the maxillofacial bones: Significantly limited by patient motion artifact although there are fractures involving the walls both maxillary antra as well as the nasal septum. No orbital fractures are seen. Electronically Signed   By: Alcide Clever M.D.   On: 01/11/2023 22:12   CT Maxillofacial Wo Contrast  Result Date: 01/11/2023 CLINICAL DATA:  Trip and fall with facial pain and headaches, initial encounter EXAM: CT HEAD WITHOUT CONTRAST CT MAXILLOFACIAL WITHOUT CONTRAST CT CERVICAL SPINE WITHOUT CONTRAST TECHNIQUE: Multidetector CT imaging of the head, cervical spine, and maxillofacial structures were performed using the standard protocol without intravenous contrast. Multiplanar CT image reconstructions of the cervical spine and maxillofacial structures were also generated. RADIATION DOSE REDUCTION: This exam was performed according to the departmental dose-optimization program which includes automated exposure control, adjustment of the mA and/or kV according to patient size  and/or use of iterative reconstruction technique. COMPARISON:  04/09/2022 FINDINGS: CT HEAD FINDINGS Brain: No evidence of acute infarction, hemorrhage, hydrocephalus, extra-axial collection or mass lesion/mass effect. Chronic atrophic changes are noted. Vascular: No hyperdense vessel or unexpected calcification. Skull: Normal. Negative for fracture or focal lesion. Other: No scalp abnormality is noted. CT MAXILLOFACIAL FINDINGS Osseous: Mild motion artifact somewhat limits the examination. Despite the significant motion artifact there appear to be multiple fractures involving the maxillary antra and nasal septum. The orbits appear within normal limits. No definitive nasal bone fracture is seen. Orbits: Orbits and their contents are within normal limits. Sinuses: Paranasal sinuses demonstrate air-fluid levels within the maxillary antra bilaterally as well as considerable mucosal thickening within the ethmoid sinuses related to the recent injury.  Soft tissues: Surrounding soft tissue structures demonstrate no focal hematoma. CT CERVICAL SPINE FINDINGS Alignment: Loss of the normal cervical lordosis is noted which may be related to muscular spasm. Skull base and vertebrae: 7 cervical segments are well visualized. Mild osteophytic changes and facet hypertrophic changes are noted. No acute fracture or acute facet abnormality is noted. The odontoid is within limits. Soft tissues and spinal canal: Surrounding soft tissue structures are within normal limits. Upper chest: Visualized lung apices demonstrate apical scarring. Other: None IMPRESSION: CT of the head: No acute intracranial abnormality noted. Chronic atrophic changes. CT of the cervical spine: Multilevel degenerative change without acute abnormality. CT of the maxillofacial bones: Significantly limited by patient motion artifact although there are fractures involving the walls both maxillary antra as well as the nasal septum. No orbital fractures are seen.  Electronically Signed   By: Alcide Clever M.D.   On: 01/11/2023 22:12   CT Cervical Spine Wo Contrast  Result Date: 01/11/2023 CLINICAL DATA:  Trip and fall with facial pain and headaches, initial encounter EXAM: CT HEAD WITHOUT CONTRAST CT MAXILLOFACIAL WITHOUT CONTRAST CT CERVICAL SPINE WITHOUT CONTRAST TECHNIQUE: Multidetector CT imaging of the head, cervical spine, and maxillofacial structures were performed using the standard protocol without intravenous contrast. Multiplanar CT image reconstructions of the cervical spine and maxillofacial structures were also generated. RADIATION DOSE REDUCTION: This exam was performed according to the departmental dose-optimization program which includes automated exposure control, adjustment of the mA and/or kV according to patient size and/or use of iterative reconstruction technique. COMPARISON:  04/09/2022 FINDINGS: CT HEAD FINDINGS Brain: No evidence of acute infarction, hemorrhage, hydrocephalus, extra-axial collection or mass lesion/mass effect. Chronic atrophic changes are noted. Vascular: No hyperdense vessel or unexpected calcification. Skull: Normal. Negative for fracture or focal lesion. Other: No scalp abnormality is noted. CT MAXILLOFACIAL FINDINGS Osseous: Mild motion artifact somewhat limits the examination. Despite the significant motion artifact there appear to be multiple fractures involving the maxillary antra and nasal septum. The orbits appear within normal limits. No definitive nasal bone fracture is seen. Orbits: Orbits and their contents are within normal limits. Sinuses: Paranasal sinuses demonstrate air-fluid levels within the maxillary antra bilaterally as well as considerable mucosal thickening within the ethmoid sinuses related to the recent injury. Soft tissues: Surrounding soft tissue structures demonstrate no focal hematoma. CT CERVICAL SPINE FINDINGS Alignment: Loss of the normal cervical lordosis is noted which may be related to muscular  spasm. Skull base and vertebrae: 7 cervical segments are well visualized. Mild osteophytic changes and facet hypertrophic changes are noted. No acute fracture or acute facet abnormality is noted. The odontoid is within limits. Soft tissues and spinal canal: Surrounding soft tissue structures are within normal limits. Upper chest: Visualized lung apices demonstrate apical scarring. Other: None IMPRESSION: CT of the head: No acute intracranial abnormality noted. Chronic atrophic changes. CT of the cervical spine: Multilevel degenerative change without acute abnormality. CT of the maxillofacial bones: Significantly limited by patient motion artifact although there are fractures involving the walls both maxillary antra as well as the nasal septum. No orbital fractures are seen. Electronically Signed   By: Alcide Clever M.D.   On: 01/11/2023 22:12   DG Chest Portable 1 View  Result Date: 01/11/2023 CLINICAL DATA:  Fall, trauma.  Shortness of breath.  Tachycardia. EXAM: PORTABLE CHEST 1 VIEW COMPARISON:  Chest radiograph 05/03/2022.  CT 07/06/2022 FINDINGS: Stable heart size and mediastinal contours. Chronic right perihilar and left suprahilar opacities, characterized on prior CT. No pulmonary  edema, large pleural effusion or pneumothorax. On limited assessment, no displaced fracture is seen. IMPRESSION: 1. No acute findings or evidence of acute traumatic injury. 2. Chronic right perihilar and left suprahilar opacities, characterized on prior CT. Electronically Signed   By: Narda Rutherford M.D.   On: 01/11/2023 21:42    Procedures Procedures  {Document cardiac monitor, telemetry assessment procedure when appropriate:1}  Medications Ordered in ED Medications  fentaNYL (SUBLIMAZE) injection 50 mcg (50 mcg Intravenous Given 01/11/23 2204)  Tdap (BOOSTRIX) injection 0.5 mL (0.5 mLs Intramuscular Given 01/11/23 2204)    ED Course/ Medical Decision Making/ A&P   {   Click here for ABCD2, HEART and other  calculatorsREFRESH Note before signing :1}                              Medical Decision Making Amount and/or Complexity of Data Reviewed Radiology: ordered.  Risk Prescription drug management.    Deborah Neal is a 72 y.o. female with a past medical history significant for hypertension, asthma, diabetes, previous cholecystectomy, pulmonary embolism on Eliquis therapy who presents for fall and facial injury.  According to patient, she was trying to chase a [place] spaniel in her house when she tripped and hit her face on a square table.  She is unsure if she was knocked out but does not think she was.  She reportedly was dazed and had some shortness of breath initially.  She thinks remembers everything.  She is denying any chest pain or shortness of breath now but she is tachycardic with rate in the 120s.  She is reporting injury to her nose and lip and is having some headache.  Denying significant neck pain.  Denies back pain.  Denies any abdominal pain or extremity pains.  Reports she was feeling well before the mechanical fall.  Patient is unsure of her last tetanus shot.  On exam, lungs clear.  Chest nontender.  Abdomen nontender.  Back and neck initially nontender.  Patient has a laceration to her upper lip that rides superior and along the vermilion border that appears to go through and through.  Some blood present any tongue laceration.  No stridor.  Patient has some epistaxis but no large nasal septal hematoma seen.  Some tenderness of the face.  Pupils symmetric and reactive with normal extraocular movements.  No other evidence of trauma initially.  We will get CT of the head, neck, and face given the area of trauma, pain, and her Eliquis use.  Will get a chest x-ray given the fall and therefore shortness of breath initially with the tachycardia now.  Sats are normal.  Will give a dose of pain medicine and will update her tetanus.  Anticipate laceration repair after imaging is  completed.  Imaging returned showing evidence of nasal fractures and bilateral maxillary fractures.  I spoke with the patient and family and they were concerned that the upper mouth was "smashed in".  I called and spoke with Dr. Jearld Fenton with ENT who reviewed the images and case and they feel she is appropriate for ED repair and discharge home with antibiotics to follow-up with him in clinic in the next few days.  Will repair laceration with and anticipate discharge home after.   {Document critical care time when appropriate:1} {Document review of labs and clinical decision tools ie heart score, Chads2Vasc2 etc:1}  {Document your independent review of radiology images, and any outside records:1} {Document your  discussion with family members, caretakers, and with consultants:1} {Document social determinants of health affecting pt's care:1} {Document your decision making why or why not admission, treatments were needed:1} Final Clinical Impression(s) / ED Diagnoses Final diagnoses:  None    Rx / DC Orders ED Discharge Orders     None

## 2023-01-11 NOTE — ED Triage Notes (Signed)
Pt c/o tripping and hitting her face on the ground. Pt has a laceration in her mouth. Pt takes eliquis.

## 2023-01-12 ENCOUNTER — Telehealth (INDEPENDENT_AMBULATORY_CARE_PROVIDER_SITE_OTHER): Payer: Self-pay | Admitting: Otolaryngology

## 2023-01-12 DIAGNOSIS — S01511A Laceration without foreign body of lip, initial encounter: Secondary | ICD-10-CM | POA: Diagnosis not present

## 2023-01-12 MED ORDER — CEPHALEXIN 500 MG PO CAPS: 500.0000 mg | ORAL_CAPSULE | Freq: Four times a day (QID) | ORAL | 0 refills | Status: AC

## 2023-01-12 MED ORDER — BACITRACIN ZINC 500 UNIT/GM EX OINT
TOPICAL_OINTMENT | Freq: Once | CUTANEOUS | Status: AC
  Administered 2023-01-12: 1 via TOPICAL
  Filled 2023-01-12: qty 0.9

## 2023-01-12 MED ORDER — OXYCODONE-ACETAMINOPHEN 5-325 MG PO TABS
1.0000 | ORAL_TABLET | ORAL | 0 refills | Status: AC | PRN
Start: 2023-01-12 — End: ?

## 2023-01-12 MED ORDER — CEPHALEXIN 500 MG PO CAPS
500.0000 mg | ORAL_CAPSULE | Freq: Once | ORAL | Status: AC
  Administered 2023-01-12: 500 mg via ORAL
  Filled 2023-01-12: qty 1

## 2023-01-12 NOTE — Telephone Encounter (Signed)
Tried to call to schedule ED follow up based on referral we received for closed fracture of nasal bone. Lvm to call back and schedule

## 2023-01-12 NOTE — Discharge Instructions (Signed)
Your history, exam, and workup today revealed nasal fractures and maxillary fractures related to the fall.  We repaired the lip laceration with 3 layers of sutures and washed it out well.  Your tetanus shot was updated and we gave you dose of antibiotics.  Please take the antibiotics for the next week to prevent infection.  I spoke with Dr. Jearld Fenton with ENT who would like to see you in clinic in the next week or so.  Please call tomorrow to get a follow-up appointment.  Please rest and stay hydrated.  You will likely continue to have some bleeding due to your blood thinner use but the large wounds were repaired.  Please avoid nose blowing as much as you can and try to keep the wound clean.  The sutures on the inside of your lip and inside of the tissue are absorbable and the sutures on the outside will need to be removed in about 5 days.  Please follow-up with your primary doctor as well.  If any symptoms change or worsen acutely, return to the nearest emergency department.  Please watch for any signs and symptoms of infection.

## 2023-01-13 ENCOUNTER — Other Ambulatory Visit (HOSPITAL_COMMUNITY): Payer: Self-pay

## 2023-01-19 ENCOUNTER — Other Ambulatory Visit (HOSPITAL_COMMUNITY): Payer: Self-pay

## 2023-01-19 ENCOUNTER — Other Ambulatory Visit: Payer: Self-pay

## 2023-01-19 ENCOUNTER — Ambulatory Visit
Admission: EM | Admit: 2023-01-19 | Discharge: 2023-01-19 | Disposition: A | Discharge status: Home / Self Care | Payer: Medicare HMO | Attending: Internal Medicine | Admitting: Internal Medicine

## 2023-01-19 ENCOUNTER — Emergency Department (HOSPITAL_COMMUNITY)
Admission: EM | Admit: 2023-01-19 | Discharge: 2023-01-19 | Disposition: A | Discharge status: Home / Self Care | Payer: Medicare HMO | Attending: Emergency Medicine | Admitting: Emergency Medicine

## 2023-01-19 DIAGNOSIS — Z7901 Long term (current) use of anticoagulants: Secondary | ICD-10-CM

## 2023-01-19 DIAGNOSIS — I10 Essential (primary) hypertension: Secondary | ICD-10-CM | POA: Diagnosis not present

## 2023-01-19 DIAGNOSIS — R040 Epistaxis: Secondary | ICD-10-CM

## 2023-01-19 DIAGNOSIS — E119 Type 2 diabetes mellitus without complications: Secondary | ICD-10-CM | POA: Diagnosis not present

## 2023-01-19 DIAGNOSIS — Z79899 Other long term (current) drug therapy: Secondary | ICD-10-CM | POA: Diagnosis not present

## 2023-01-19 DIAGNOSIS — Z794 Long term (current) use of insulin: Secondary | ICD-10-CM | POA: Insufficient documentation

## 2023-01-19 DIAGNOSIS — Z9229 Personal history of other drug therapy: Secondary | ICD-10-CM

## 2023-01-19 DIAGNOSIS — B370 Candidal stomatitis: Secondary | ICD-10-CM

## 2023-01-19 LAB — CBC
HCT: 40.6 % (ref 36.0–46.0)
Hemoglobin: 12.1 g/dL (ref 12.0–15.0)
MCH: 22.9 pg — ABNORMAL LOW (ref 26.0–34.0)
MCHC: 29.8 g/dL — ABNORMAL LOW (ref 30.0–36.0)
MCV: 76.7 fL — ABNORMAL LOW (ref 80.0–100.0)
Platelets: 272 10*3/uL (ref 150–400)
RBC: 5.29 MIL/uL — ABNORMAL HIGH (ref 3.87–5.11)
RDW: 14.9 % (ref 11.5–15.5)
WBC: 6.4 10*3/uL (ref 4.0–10.5)
nRBC: 0 % (ref 0.0–0.2)

## 2023-01-19 LAB — PROTIME-INR
INR: 1.1 (ref 0.8–1.2)
Prothrombin Time: 14.4 seconds (ref 11.4–15.2)

## 2023-01-19 MED ORDER — OXYMETAZOLINE HCL 0.05 % NA SOLN: 1.0000 | Freq: Once | NASAL | Status: DC

## 2023-01-19 MED ORDER — FLUCONAZOLE 150 MG PO TABS
150.0000 mg | ORAL_TABLET | Freq: Every day | ORAL | 0 refills | Status: DC
  Filled 2023-01-19: qty 1, 1d supply, fill #0

## 2023-01-19 MED ORDER — OXYMETAZOLINE HCL 0.05 % NA SOLN
1.0000 | Freq: Once | NASAL | Status: AC
  Administered 2023-01-19: 1 via NASAL
  Filled 2023-01-19: qty 30

## 2023-01-19 MED ORDER — CLOTRIMAZOLE 10 MG MT TROC
10.0000 mg | Freq: Every day | OROMUCOSAL | 0 refills | Status: AC
  Filled 2023-01-19: qty 35, 7d supply, fill #0

## 2023-01-19 MED ORDER — SILVER NITRATE-POT NITRATE 75-25 % EX MISC
1.0000 | Freq: Once | CUTANEOUS | Status: AC
  Administered 2023-01-19: 1 via TOPICAL
  Filled 2023-01-19: qty 10

## 2023-01-19 MED ORDER — FLUCONAZOLE 150 MG PO TABS
150.0000 mg | ORAL_TABLET | Freq: Once | ORAL | Status: AC
  Administered 2023-01-19: 150 mg via ORAL
  Filled 2023-01-19: qty 1

## 2023-01-19 MED ORDER — CLOTRIMAZOLE 10 MG MT TROC
10.0000 mg | Freq: Once | OROMUCOSAL | Status: AC
  Administered 2023-01-19: 10 mg via ORAL
  Filled 2023-01-19: qty 1

## 2023-01-19 MED ORDER — LIDOCAINE VISCOUS HCL 2 % MT SOLN
15.0000 mL | Freq: Once | OROMUCOSAL | Status: AC
  Administered 2023-01-19: 15 mL via OROMUCOSAL
  Filled 2023-01-19: qty 15

## 2023-01-19 NOTE — ED Notes (Signed)
Pt ambulated without issue.

## 2023-01-19 NOTE — ED Provider Notes (Signed)
  [facility]    CSN: [id number] Arrival date & time: 01/19/23  1804      History   Chief Complaint No chief complaint on file.   HPI Deborah Neal is a 72 y.o. female presents for nosebleed.  Patient was seen in the ER on 9/17 for fall where she broke her nose.  She reports she has been having intermittent nosebleeding since then.  Today it started again and she has been unable to control it.  Patient is on Eliquis for blood thinning medications.  She has an appoint with ENT tomorrow.  Discussed with patient unable to treat for nosebleed in the setting especially given recent nasal fracture and the fact that she is on blood thinners.  Advised ER evaluation.  Pt is in agreement with plan and will go POV to the ER with her friend driving her.    Radford Pax, NP 01/19/23 [phone]

## 2023-01-19 NOTE — ED Triage Notes (Addendum)
Pt to ED from [facility] c/o nosebleed x 1 hr, pt states had a fall last week and broke nose in 3 places. Pt is on Eliquis. Not actively bleeding in triage

## 2023-01-19 NOTE — ED Provider Notes (Signed)
Deborah Neal EMERGENCY DEPARTMENT AT [facility] Provider Note   CSN: [id number] Arrival date & time: 01/19/23  1843     History  Chief Complaint  Patient presents with   Epistaxis    Deborah Neal is a 72 y.o. female.  Pt is a 72 yo female with pmhx significant for asthma, hld, htn, dm2, gerd, PE (on Eliquis), cad.  Pt did fall on 9/17 and broke her nose.  She has an appt with ENT tomorrow, but has been having worsening bleeding from her nose.  She went to [facility] and they sent her here.  Pt also has pain in her mouth.       Home Medications Prior to Admission medications   Medication Sig Start Date End Date Taking? Authorizing Provider  clotrimazole (MYCELEX) 10 MG troche Take 1 tablet (10 mg total) by mouth 5 (five) times daily for 7 days. 01/19/23 01/26/23 Yes Jacalyn Lefevre, MD  fluconazole (DIFLUCAN) 150 MG tablet Take 1 tablet (150 mg total) by mouth daily. 01/19/23  Yes Jacalyn Lefevre, MD  Accu-Chek Softclix Lancets lancets Use as directed up to four times daily as directed 05/04/22     acetaminophen (TYLENOL) 325 MG tablet Take 1-2 tablets (325-650 mg total) by mouth every 4 (four) hours as needed for mild pain. 05/03/22   Setzer, Lynnell Jude, PA-C  amLODipine (NORVASC) 2.5 MG tablet Take 3 tablets (7.5 mg total) by mouth daily. 09/23/22   Corwin Levins, MD  apixaban (ELIQUIS) 5 MG TABS tablet Take 1 tablet (5 mg total) by mouth 2 (two) times daily. 11/03/22   Corwin Levins, MD  blood glucose meter kit and supplies Use up to four times daily as directed. 05/03/22   Setzer, Lynnell Jude, PA-C  cephALEXin (KEFLEX) 500 MG capsule Take 1 capsule (500 mg total) by mouth 4 (four) times daily for 7 days. 01/12/23 01/19/23  Tegeler, Canary Brim, MD  furosemide (LASIX) 20 MG tablet Take 1 tablet (20 mg total) by mouth daily. 05/05/22   Corwin Levins, MD  glimepiride (AMARYL) 2 MG tablet Take 1 tablet (2 mg total) by mouth daily before breakfast. 12/21/22   Thapa, [place], MD  glucose blood test  strip Use as directed up to four times daily as directed 05/05/22   Corwin Levins, MD  HYDROcodone-acetaminophen (NORCO/VICODIN) 5-325 MG tablet Take 1 tablet by mouth every 4 (four) hours as needed for moderate pain. 05/04/22 05/04/23  Setzer, Lynnell Jude, PA-C  insulin aspart (NOVOLOG FLEXPEN) 100 UNIT/ML FlexPen Inject 15 Units into the skin 3 (three) times daily with meals. 12/21/22   Thapa, [place], MD  insulin glargine (LANTUS) 100 UNIT/ML Solostar Pen Inject 20 Units into the skin 2 (two) times daily. 12/21/22   Thapa, [place], MD  Insulin Pen Needle (CAREFINE PEN NEEDLES) 32G X 4 MM MISC Use to inject insulin 4 times daily. 05/04/22   Setzer, Lynnell Jude, PA-C  levothyroxine (SYNTHROID) 75 MCG tablet Take 1 tablet (75 mcg total) by mouth daily at 6 (six) AM. 05/05/22   Corwin Levins, MD  lidocaine (LIDODERM) 5 % Place 3 patches onto the skin daily. Remove & Discard patch within 12 hours or as directed by MD 05/04/22   Valetta Fuller, Lynnell Jude, PA-C  losartan (COZAAR) 25 MG tablet Take 1 tablet (25 mg total) by mouth daily. 05/05/22   Corwin Levins, MD  magnesium oxide (MAG-OX) 400 MG tablet Take 1 tablet (400 mg total) by mouth daily. 06/10/22   Jonny Ruiz,  Len Blalock, MD  Multiple Vitamin (MULTIVITAMIN WITH MINERALS) TABS tablet Take 1 tablet by mouth daily. 05/03/22   Setzer, Lynnell Jude, PA-C  oxyCODONE-acetaminophen (PERCOCET/ROXICET) 5-325 MG tablet Take 1 tablet by mouth every 4 (four) hours as needed for severe pain. 01/12/23   Tegeler, Canary Brim, MD  potassium chloride SA (KLOR-CON M) 20 MEQ tablet Take 1 tablet (20 mEq total) by mouth daily. 05/05/22   Corwin Levins, MD  rosuvastatin (CRESTOR) 20 MG tablet Take 1 tablet (20 mg total) by mouth daily. 05/05/22   Corwin Levins, MD  lisdexamfetamine (VYVANSE) 70 MG capsule Take 1 capsule (70 mg total) by mouth daily. 07/05/22 07/30/22  Cottle, Steva Ready., MD      Allergies    Shingrix [zoster vac recomb adjuvanted]    Review of Systems   Review of Systems  HENT:  Positive for  mouth sores and nosebleeds.   All other systems reviewed and are negative.   Physical Exam Updated Vital Signs BP 134/77   Pulse 99   Resp 16   Ht 5\' 3"  (1.6 m)   Wt 77 kg   SpO2 97%   BMI 30.07 kg/m  Physical Exam Vitals and nursing note reviewed.  Constitutional:      Appearance: Normal appearance. She is obese.  HENT:     Head: Normocephalic and atraumatic.     Right Ear: External ear normal.     Left Ear: External ear normal.     Nose:     Right Nostril: Epistaxis present.     Left Nostril: Epistaxis present.     Comments: Oral thrush    Mouth/Throat:     Mouth: Mucous membranes are moist.     Pharynx: Oropharynx is clear.  Eyes:     Extraocular Movements: Extraocular movements intact.     Conjunctiva/sclera: Conjunctivae normal.     Pupils: Pupils are equal, round, and reactive to light.  Cardiovascular:     Rate and Rhythm: Normal rate and regular rhythm.     Pulses: Normal pulses.     Heart sounds: Normal heart sounds.  Pulmonary:     Effort: Pulmonary effort is normal.     Breath sounds: Normal breath sounds.  Abdominal:     General: Abdomen is flat. Bowel sounds are normal.     Palpations: Abdomen is soft.  Musculoskeletal:        General: Normal range of motion.     Cervical back: Normal range of motion and neck supple.  Skin:    General: Skin is warm.     Capillary Refill: Capillary refill takes less than 2 seconds.  Neurological:     General: No focal deficit present.     Mental Status: She is alert and oriented to person, place, and time.  Psychiatric:        Mood and Affect: Mood normal.        Behavior: Behavior normal.     ED Results / Procedures / Treatments   Labs (all labs ordered are listed, but only abnormal results are displayed) Labs Reviewed  CBC - Abnormal; Notable for the following components:      Result Value   RBC 5.29 (*)    MCV 76.7 (*)    MCH 22.9 (*)    MCHC 29.8 (*)    All other components within normal limits   PROTIME-INR    EKG None  Radiology No results found.  Procedures .Epistaxis Management  Date/Time: 01/19/2023 10:40 PM  Performed by: Jacalyn Lefevre, MD Authorized by: Jacalyn Lefevre, MD   Consent:    Consent obtained:  Verbal   Consent given by:  Patient   Risks discussed:  Bleeding Universal protocol:    Patient identity confirmed:  Verbally with patient Anesthesia:    Anesthesia method:  Topical application   Topical anesthetic:  Lidocaine gel Procedure details:    Treatment site:  L anterior and R anterior   Treatment method:  Silver nitrate   Treatment episode: initial   Post-procedure details:    Assessment:  No improvement   Procedure completion:  Tolerated well, no immediate complications .Epistaxis Management  Date/Time: 01/19/2023 10:41 PM  Performed by: Jacalyn Lefevre, MD Authorized by: Jacalyn Lefevre, MD   Consent:    Consent obtained:  Verbal   Consent given by:  Patient Universal protocol:    Patient identity confirmed:  Verbally with patient Anesthesia:    Anesthesia method:  Topical application   Topical anesthetic:  Lidocaine gel Procedure details:    Treatment site:  L anterior   Treatment method:  Nasal tampon   Treatment episode: recurring   Post-procedure details:    Assessment:  Bleeding decreased   Procedure completion:  Tolerated well, no immediate complications     Medications Ordered in ED Medications  silver nitrate applicators applicator 1 Application (1 Application Topical Given by Other 01/19/23 2210)  lidocaine (XYLOCAINE) 2 % viscous mouth solution 15 mL (15 mLs Mouth/Throat Given 01/19/23 2208)  oxymetazoline (AFRIN) 0.05 % nasal spray 1 spray (1 spray Each Nare Given 01/19/23 2210)  fluconazole (DIFLUCAN) tablet 150 mg (150 mg Oral Given 01/19/23 2215)  clotrimazole (MYCELEX) troche 10 mg (10 mg Oral Given 01/19/23 2208)    ED Course/ Medical Decision Making/ A&P                                 Medical Decision  Making Amount and/or Complexity of Data Reviewed Labs: ordered.  Risk OTC drugs. Prescription drug management.   This patient presents to the ED for concern of epistaxis, this involves an extensive number of treatment options, and is a complaint that carries with it a high risk of complications and morbidity.  The differential diagnosis includes eliquis, fx nose   Co morbidities that complicate the patient evaluation  asthma, hld, htn, dm2, gerd, PE (on Eliquis), cad   Additional history obtained:  Additional history obtained from epic chart review External records from outside source obtained and reviewed including daughter   Lab Tests:  I Ordered, and personally interpreted labs.  The pertinent results include:  cbc nl, inr 1.1  Medicines ordered and prescription drug management:  I ordered medication including diflucan/clotrimazole  for thrush  Reevaluation of the patient after these medicines showed that the patient stayed the same I have reviewed the patients home medicines and have made adjustments as needed    Problem List / ED Course:  Epistaxis:  pt still has a little oozing from right nostril.  Left side looks good with packing.  She does not want me to pack that one. She does have an appt with Dr. Jearld Fenton tomorrow morning at 1030.  She is ready to go home.  She is stable for d/c.  Return if worse. Oral thrush:  diflucan and clotrimazole ordered.     Reevaluation:  After the interventions noted above, I reevaluated the patient and found that they have :improved   Social  Determinants of Health:  Lives at home   Dispostion:  After consideration of the diagnostic results and the patients response to treatment, I feel that the patent would benefit from discharge with outpatient f/u.          Final Clinical Impression(s) / ED Diagnoses Final diagnoses:  Epistaxis  On apixaban therapy  Thrush    Rx / DC Orders ED Discharge Orders           Ordered    fluconazole (DIFLUCAN) 150 MG tablet  Daily        01/19/23 2320    clotrimazole (MYCELEX) 10 MG troche  5 times daily        01/19/23 2320              Jacalyn Lefevre, MD 01/19/23 2322

## 2023-01-19 NOTE — ED Triage Notes (Signed)
Pt presents to UC w/ daughter w/ c/o nose starting to bleed 15 minutes ago. Pt states she was "laying down and it started bleeding." Denies hitting her head today.  Pt on eliquis.

## 2023-01-19 NOTE — Discharge Instructions (Addendum)
Hold Eliquis tonight and tomorrow morning.

## 2023-01-19 NOTE — Discharge Instructions (Signed)
Patient to ER for further treatment

## 2023-01-20 ENCOUNTER — Other Ambulatory Visit (HOSPITAL_COMMUNITY): Payer: Self-pay

## 2023-01-20 ENCOUNTER — Ambulatory Visit (INDEPENDENT_AMBULATORY_CARE_PROVIDER_SITE_OTHER): Payer: Medicare HMO | Admitting: Otolaryngology

## 2023-01-20 ENCOUNTER — Encounter (INDEPENDENT_AMBULATORY_CARE_PROVIDER_SITE_OTHER): Payer: Self-pay | Admitting: Otolaryngology

## 2023-01-20 DIAGNOSIS — W19XXXA Unspecified fall, initial encounter: Secondary | ICD-10-CM

## 2023-01-20 DIAGNOSIS — S0242XA Fracture of alveolus of maxilla, initial encounter for closed fracture: Secondary | ICD-10-CM | POA: Diagnosis not present

## 2023-01-20 DIAGNOSIS — S0240DA Maxillary fracture, left side, initial encounter for closed fracture: Secondary | ICD-10-CM

## 2023-01-20 DIAGNOSIS — S0240CA Maxillary fracture, right side, initial encounter for closed fracture: Secondary | ICD-10-CM | POA: Diagnosis not present

## 2023-01-20 DIAGNOSIS — Z7901 Long term (current) use of anticoagulants: Secondary | ICD-10-CM

## 2023-01-20 DIAGNOSIS — S022XXA Fracture of nasal bones, initial encounter for closed fracture: Secondary | ICD-10-CM

## 2023-01-20 DIAGNOSIS — R040 Epistaxis: Secondary | ICD-10-CM

## 2023-01-20 DIAGNOSIS — S0993XA Unspecified injury of face, initial encounter: Secondary | ICD-10-CM

## 2023-01-20 DIAGNOSIS — S01511A Laceration without foreign body of lip, initial encounter: Secondary | ICD-10-CM | POA: Diagnosis not present

## 2023-01-20 DIAGNOSIS — S0280XA Fracture of other specified skull and facial bones, unspecified side, initial encounter for closed fracture: Secondary | ICD-10-CM

## 2023-01-21 NOTE — Progress Notes (Signed)
Dear Dr. Jonny Ruiz, Here is my assessment for our mutual patient, Deborah Neal. Thank you for allowing me the opportunity to care for your patient. Please do not hesitate to contact me should you have any other questions. Sincerely, Dr. Jovita Kussmaul  Otolaryngology Clinic Note Referring provider: Dr. Jonny Ruiz HPI:  Deborah Neal is a 72 y.o. female kindly referred by Dr. Jonny Ruiz for evaluation of ED follow up after facial trauma. She had a fall on 9/17 and went to ED where they repaired a upper lip laceration and was discharged. She denies LOC. CT showed fairly severe septal fracture, nasal bone fracture and maxillary wall fracture. There was some motion artifact so image quality is suboptimal.  She reports she had epistaxis on and off since the fall, but was worse yesterday so went to ED where she had a pope pack placed on left. She has continued to bleed, mostly on left. She is on eilquis from prior PE.  Patient reports today: she has some mouth pain and her front gums seem more recessed than lower gums (she is edentulous). No problems with incision. She is eating soft foods right now. Can't wear her dentures due to pain. Patient denies:  - other lacerations, trismus - enophthalmos, hypoglobus, vision loss or change,  - significant facial deformity - trouble swallowing - hearing loss after trauma - otorrhea, vertigo.   Unable to assess nasal obstruction due to pack but breathes ok from right side. She is on diflucan and clotrimazole due to diagnosis of thrush in ED yesterday.She reports she did not get any there antibitics with pack in place.  PMHx: hypertension, asthma, diabetes, previous cholecystectomy, pulmonary embolism on Eliquis therapy    PMH/Meds/All/SocHx/FamHx/ROS:   Past Medical History:  Diagnosis Date   Acute massive pulmonary embolism (HCC)    ALLERGIC RHINITIS 01/29/2007   Aortic atherosclerosis (HCC)    Arthritis    ASTHMA 01/29/2007   ASTHMA, WITH ACUTE EXACERBATION 06/24/2008    BACK PAIN 01/29/2009   Cardiac arrest [facility])    CHEST PAIN 06/24/2008   Coronary artery calcification seen on CT scan    Family history of adverse reaction to anesthesia    Patients daughter has N/V after anesthesia   GERD (gastroesophageal reflux disease)    History of shingles    HYPERLIPIDEMIA 01/29/2007   HYPERTENSION 01/29/2007   OSTEOPENIA 10/16/2007   Type II or unspecified type diabetes mellitus without mention of complication, uncontrolled 11/29/2013    Past Surgical History:  Procedure Laterality Date   CHOLECYSTECTOMY N/A 09/19/2015   Procedure: LAPAROSCOPIC CHOLECYSTECTOMY WITH INTRAOPERATIVE CHOLANGIOGRAM;  Surgeon: Avel Peace, MD;  Location: [facility] OR;  Service: General;  Laterality: N/A;   COLONOSCOPY     TONSILLECTOMY  1956   VIDEO BRONCHOSCOPY Bilateral 03/28/2014   Procedure: VIDEO BRONCHOSCOPY WITHOUT FLUORO;  Surgeon: Nyoka Cowden, MD;  Location: [facility] ENDOSCOPY;  Service: Cardiopulmonary;  Laterality: Bilateral;    Family History  Problem Relation Age of Onset   Cancer Mother        ? type    Diabetes Father    Hypertension Father    Heart disease Father    Asthma Father    Asthma Daughter    Colon cancer Neg Hx    Esophageal cancer Neg Hx    Rectal cancer Neg Hx    Stomach cancer Neg Hx    Breast cancer Neg Hx     Social Connections: Moderately Integrated (11/09/2022)   Social Connection and Isolation Panel [NHANES]  Frequency of Communication with Friends and Family: More than three times a week    Frequency of Social Gatherings with Friends and Family: More than three times a week    Attends Religious Services: More than 4 times per year    Active Member of [employer] or Organizations: Yes    Attends Engineer, structural: More than 4 times per year    Marital Status: Never married      Current Outpatient Medications:    Accu-Chek Softclix Lancets lancets, Use as directed up to four times daily as directed, Disp: 100 each, Rfl: 0    acetaminophen (TYLENOL) 325 MG tablet, Take 1-2 tablets (325-650 mg total) by mouth every 4 (four) hours as needed for mild pain., Disp: , Rfl:    amLODipine (NORVASC) 2.5 MG tablet, Take 3 tablets (7.5 mg total) by mouth daily., Disp: 90 tablet, Rfl: 3   apixaban (ELIQUIS) 5 MG TABS tablet, Take 1 tablet (5 mg total) by mouth 2 (two) times daily., Disp: 60 tablet, Rfl: 4   blood glucose meter kit and supplies, Use up to four times daily as directed., Disp: 1 each, Rfl: 0   clotrimazole (MYCELEX) 10 MG troche, Take 1 tablet (10 mg total) by mouth 5 (five) times daily for 7 days., Disp: 35 tablet, Rfl: 0   fluconazole (DIFLUCAN) 150 MG tablet, Take 1 tablet (150 mg total) by mouth daily., Disp: 1 tablet, Rfl: 0   furosemide (LASIX) 20 MG tablet, Take 1 tablet (20 mg total) by mouth daily., Disp: 90 tablet, Rfl: 3   glimepiride (AMARYL) 2 MG tablet, Take 1 tablet (2 mg total) by mouth daily before breakfast., Disp: 90 tablet, Rfl: 3   glucose blood test strip, Use as directed up to four times daily as directed, Disp: 400 each, Rfl: 3   HYDROcodone-acetaminophen (NORCO/VICODIN) 5-325 MG tablet, Take 1 tablet by mouth every 4 (four) hours as needed for moderate pain., Disp: 20 tablet, Rfl: 0   insulin aspart (NOVOLOG FLEXPEN) 100 UNIT/ML FlexPen, Inject 15 Units into the skin 3 (three) times daily with meals., Disp: 30 mL, Rfl: 4   insulin glargine (LANTUS) 100 UNIT/ML Solostar Pen, Inject 20 Units into the skin 2 (two) times daily., Disp: 15 mL, Rfl: 11   Insulin Pen Needle (CAREFINE PEN NEEDLES) 32G X 4 MM MISC, Use to inject insulin 4 times daily., Disp: 100 each, Rfl: 0   levothyroxine (SYNTHROID) 75 MCG tablet, Take 1 tablet (75 mcg total) by mouth daily at 6 (six) AM., Disp: 90 tablet, Rfl: 3   lidocaine (LIDODERM) 5 %, Place 3 patches onto the skin daily. Remove & Discard patch within 12 hours or as directed by MD, Disp: 30 patch, Rfl: 0   losartan (COZAAR) 25 MG tablet, Take 1 tablet (25 mg total)  by mouth daily., Disp: 90 tablet, Rfl: 3   magnesium oxide (MAG-OX) 400 MG tablet, Take 1 tablet (400 mg total) by mouth daily., Disp: 90 tablet, Rfl: 3   Multiple Vitamin (MULTIVITAMIN WITH MINERALS) TABS tablet, Take 1 tablet by mouth daily., Disp: , Rfl:    oxyCODONE-acetaminophen (PERCOCET/ROXICET) 5-325 MG tablet, Take 1 tablet by mouth every 4 (four) hours as needed for severe pain., Disp: 15 tablet, Rfl: 0   potassium chloride SA (KLOR-CON M) 20 MEQ tablet, Take 1 tablet (20 mEq total) by mouth daily., Disp: 90 tablet, Rfl: 3   rosuvastatin (CRESTOR) 20 MG tablet, Take 1 tablet (20 mg total) by mouth daily., Disp: 90 tablet,  Rfl: 3  A 10-point ROS was performed with pertinent positives/negatives noted in the HPI Physical Exam:   BP 132/81 (BP Location: Left Arm, Patient Position: Sitting, Cuff Size: Normal)   Pulse (!) 107   Ht 5\' 2"  (1.575 m)   Wt 169 lb (76.7 kg)   SpO2 97%   BMI 30.91 kg/m    Salient findings:  CN II-XII intact; no significant facial bruising Bilateral EAC clear and TM intact with well pneumatized middle ear spaces Left nasal pope pack in place with bleeding in oropharynx (modest), left nare primarily and some on right (very little compared to left). See note below regarding epistaxis control. After hemostasis was achieved, anterior rhinoscopy shows some clot still but no obvious septal hematoma on palpation - septal deviation left with nasal bony deformity S shaped (modest).  No lesions of oral cavity/oropharynx; patient is edentulous but clear alveolar ridge fracture and mobility over prior incisor area in midline; no open wound; palate with small amount of mobility based on my examination today; mandible stable; midface itself is not mobile however No other significant midface mobility; no trismus No obviously palpable neck masses/lymphadenopathy/thyromegaly No respiratory distress or stridor Upper lip incision healing appropriately  Independent Review of  Additional Tests or Records:  CT face imaging independently reviewed 12/2022: fairly severe septal fracture, without significant nasal bone fracture and bilateral anterior and posterior maxillary wall fracture. There was some motion artifact so image quality is suboptimal. Mastoids well aerated, no mandible or frontal sinus fracture noted; small maxillary alveolar ridge fracture noted and a midline palatal fracture (minimal comminution). Pterygoids seem intact.  Procedures:  PROCEDURE NOTE: Preoperative diagnosis: epistaxis, bilateral  Postoperative diagnosis: same  Procedure: Simple control of nasal hemorrhage (CPT [phone]) Indication: Epistaxis EBL: 10 mL Surgeon: Jovita Kussmaul, MD   Procedure: The patient was identified and properly positioned. The pack on left was only halfway in place, and there was bleeding around the pack. S, pope pack was removed. Topical lidocaine and Afrin were applied to the nasal cavity bilaterally and pressure held. Cold water gargles were performed by the patient and a drip pad was placed. After 15 minutes had elapsed, there was no further bleeding from nose or oral cavity; anterior rhinoscopy did not show an obvious bleeding source. No septal hematoma was identified on palpation. There were no polyps noted. We discussed re-packing with absorbable material or pope pack given pack had been place for only one day and because she is on eliquis. We also discussed risk of packing and the bneefits f packing. She did not wish to have any further packing, so we waited another minutes and re-checked her nse and mouth. No further bleeding was noted.  Patient tolerated procedure well.    Impression & Plans:  Deborah Neal is a 72 y.o. female with facial trauma after a fall. Multiple fractures including septal fracture, likely nasal bone fracture, bilateral maxillary A + P posterior wall fractures, maxillary alveolar ridge fracture, and small palatal fracture. She also is on eliquis due  to prior PE with epistaxis which was treated today. She was hemostatic after epistaxis control and did not wish for re-packing. She is edentulous. Nasal breathing was difficult to assess due to recent epistaxis and some clot. She does not wish for any further procedures. We discussed her options regarding fractures -  Septal fracture and maxillary wall fractures: - Given extent, would be a closed septal reduction candidate -- not currently due to epistaxis 2. Palate and alveolar ridge fracture  palate fixation -- not interested and she wears dentures -- may have to get them adjusted in the future.  - will be on soft diet 3. Lip sutures - will remove at next visit 4. Epistaxis - hemostatic after removal of prior pack and afrin - We discussed epistaxis prevention measures - Given eliquis use, will go ahead and use afrin on schedule for next 3 days ONLY - Nasal saline spray PRN - Would hold eliquis next 2-3 days (can resume sooner if stops bleeding from 24 hours) - Recheck in 1 week for further check regarding any candidacy for CSR (likely too late); suture removal, and palate recheck  Return precautions for epistaxis discussed including if she re-bleeds needs to go to ED    I have personally spent 70 minutes involved in face-to-face and non-face-to-face activities for this patient on the day of the visit.  Professional time spent includes the following activities, in addition to those noted in the documentation: preparing to see the patient (review of outside documentation and results - CT), performing a medically appropriate examination and/or evaluation, counseling and educating the patient/family/caregiver, ordering medications, performing procedures (epistaxis control), referring and communicating with other healthcare professionals, documenting clinical information in the electronic or other health record, independently interpreting results and communicating results with the  patient/family/caregiver.     Thank you for allowing me the opportunity to care for your patient. Please do not hesitate to contact me should you have any other questions.  Sincerely, Jovita Kussmaul, MD Otolarynoglogist (ENT), [facility] Phone: [phone] Fax: [phone]  01/21/2023, 9:29 AM

## 2023-01-27 ENCOUNTER — Encounter (INDEPENDENT_AMBULATORY_CARE_PROVIDER_SITE_OTHER): Payer: Self-pay | Admitting: Otolaryngology

## 2023-01-27 ENCOUNTER — Ambulatory Visit: Payer: Self-pay

## 2023-01-27 ENCOUNTER — Ambulatory Visit (INDEPENDENT_AMBULATORY_CARE_PROVIDER_SITE_OTHER): Payer: Medicare HMO | Admitting: Otolaryngology

## 2023-01-27 ENCOUNTER — Other Ambulatory Visit (HOSPITAL_COMMUNITY): Payer: Self-pay

## 2023-01-27 VITALS — BP 93/59 | HR 81 | Ht 62.0 in | Wt 160.8 lb

## 2023-01-27 DIAGNOSIS — S022XXA Fracture of nasal bones, initial encounter for closed fracture: Secondary | ICD-10-CM

## 2023-01-27 DIAGNOSIS — S0240CD Maxillary fracture, right side, subsequent encounter for fracture with routine healing: Secondary | ICD-10-CM | POA: Diagnosis not present

## 2023-01-27 DIAGNOSIS — Z189 Retained foreign body fragments, unspecified material: Secondary | ICD-10-CM

## 2023-01-27 DIAGNOSIS — S0242XD Fracture of alveolus of maxilla, subsequent encounter for fracture with routine healing: Secondary | ICD-10-CM

## 2023-01-27 DIAGNOSIS — S01511D Laceration without foreign body of lip, subsequent encounter: Secondary | ICD-10-CM | POA: Diagnosis not present

## 2023-01-27 DIAGNOSIS — S0242XA Fracture of alveolus of maxilla, initial encounter for closed fracture: Secondary | ICD-10-CM

## 2023-01-27 DIAGNOSIS — S0993XA Unspecified injury of face, initial encounter: Secondary | ICD-10-CM

## 2023-01-27 DIAGNOSIS — S0280XA Fracture of other specified skull and facial bones, unspecified side, initial encounter for closed fracture: Secondary | ICD-10-CM

## 2023-01-27 DIAGNOSIS — S022XXD Fracture of nasal bones, subsequent encounter for fracture with routine healing: Secondary | ICD-10-CM

## 2023-01-27 DIAGNOSIS — W19XXXD Unspecified fall, subsequent encounter: Secondary | ICD-10-CM | POA: Diagnosis not present

## 2023-01-27 DIAGNOSIS — T8189XD Other complications of procedures, not elsewhere classified, subsequent encounter: Secondary | ICD-10-CM

## 2023-01-27 DIAGNOSIS — S0240DD Maxillary fracture, left side, subsequent encounter for fracture with routine healing: Secondary | ICD-10-CM | POA: Diagnosis not present

## 2023-01-27 NOTE — Patient Outreach (Signed)
Care Coordination   Follow Up Visit Note   01/27/2023 Name: Deborah Neal MRN: [id number] DOB: 06/30/1950  Deborah Neal is a 72 y.o. year old female who sees Corwin Levins, MD for primary care. I spoke with  Richelle Ito by phone today.  What matters to the patients health and wellness today?  Ms. Huner reports fall last month while running behind her dog-fractured nose-followed by Dr. Allena Katz, ENT. She reports it is better now. She reports improved blood sugar since last telephone visit. BS this am 160.  She reports she continues to cut back on soda and working on portion size. Follow up appointment with endocrinologist scheduled for 02/01/23.  Goals Addressed             This Visit's Progress    Assist with Health management/education Diabetes       Interventions Today    Flowsheet Row Most Recent Value  Chronic Disease   Chronic disease during today's visit Diabetes  General Interventions   General Interventions Discussed/Reviewed General Interventions Reviewed, Doctor Visits  [Evaluation of current treatment plan for Diabetes and patients adherence to plan.]  Doctor Visits Discussed/Reviewed Doctor Visits Reviewed  [reviewed upcoming appointments including endocrinology appointment 02/01/23. confirmed patient has transportation]  Education Interventions   Education Provided Provided Education, Provided Printed Education  [resent emmi articles:"ABC of Diabetes",  "Diabetes and Diet" and "Type 2 Diabetes"]  Provided Verbal Education On Exercise, Medication, When to see the doctor, Nutrition  Nutrition Interventions   Nutrition Discussed/Reviewed Portion sizes, Decreasing sugar intake, Nutrition Discussed  Pharmacy Interventions   Pharmacy Dicussed/Reviewed Pharmacy Topics Reviewed  Safety Interventions   Safety Discussed/Reviewed Safety Discussed            SDOH assessments and interventions completed:  No  Care Coordination Interventions:  Yes, provided   Follow up plan:  Follow up call scheduled for 02/21/23    Encounter Outcome:  Patient Visit Completed

## 2023-01-27 NOTE — Patient Instructions (Signed)
Visit Information  Thank you for taking time to visit with me today. Please don't hesitate to contact me if I can be of assistance to you.   Following are the goals we discussed today:  Continue to take medications as prescribed. Continue to attend provider visits as scheduled Continue to eat healthy, lean meats, vegetables, fruits, avoid saturated and transfats Continue to check blood sugar as recommended and notify provider if questions or concerns   Our next appointment is by telephone on 02/21/2023 at 3:30 pm  Please call the care guide team at [phone] if you need to cancel or reschedule your appointment.   If you are experiencing a Mental Health or Behavioral Health Crisis or need someone to talk to, please call the Suicide and Crisis Lifeline: 988 call the [place] National Suicide Prevention Lifeline: (276)314-9977 or TTY: 240-108-8898 TTY 807-288-0330) to talk to a trained counselor call 1-800-273-TALK (toll free, 24 hour hotline)  Kathyrn Sheriff, RN, MSN, BSN, CCM Care Management Coordinator [phone]

## 2023-01-27 NOTE — Progress Notes (Signed)
Dear Dr. Jonny Ruiz, Here is my assessment for our mutual patient, Deborah Neal. Thank you for allowing me the opportunity to care for your patient. Please do not hesitate to contact me should you have any other questions. Sincerely, Dr. Jovita Kussmaul  Otolaryngology Clinic Note Referring provider: Dr. Jonny Ruiz HPI:  Deborah Neal is a 72 y.o. female kindly referred by Dr. Jonny Ruiz for evaluation of ED follow up after facial trauma. She had a fall on 9/17 and went to ED where they repaired a upper lip laceration and was discharged. She denies LOC. CT showed fairly severe septal fracture, nasal bone fracture and maxillary wall fracture. There was some motion artifact so image quality is suboptimal.  Initial evaluation (Sept 2024):  She reports she had epistaxis on and off since the fall, but was worse yesterday so went to ED where she had a pope pack placed on left. She has continued to bleed, mostly on left. She is on eilquis from prior PE.  Patient reports today: she has some mouth pain and her front gums seem more recessed than lower gums (she is edentulous). No problems with incision. She is eating soft foods right now. Can't wear her dentures due to pain. Patient denies:  - other lacerations, trismus - enophthalmos, hypoglobus, vision loss or change,  - significant facial deformity - trouble swallowing - hearing loss after trauma - otorrhea, vertigo.   Unable to assess nasal obstruction due to pack but breathes ok from right side. She is on diflucan and clotrimazole due to diagnosis of thrush in ED yesterday.She reports she did not get any there antibitics with pack in place.  Today (01/27/2023): Patient returns for follow up today for reassessment. She reports that she is doing very well. No bleeding from nose, has restarted eliquis. Not using afrin. She denies cosmetic deformity of nose, nasal obstruction. She was advised a soft diet but has been eating everything without issue. She reports she actually  does not wear dentures and eats everything without them.  PMHx: hypertension, asthma, diabetes, previous cholecystectomy, pulmonary embolism on Eliquis therapy    PMH/Meds/All/SocHx/FamHx/ROS:   Past Medical History:  Diagnosis Date   Acute massive pulmonary embolism (HCC)    ALLERGIC RHINITIS 01/29/2007   Aortic atherosclerosis (HCC)    Arthritis    ASTHMA 01/29/2007   ASTHMA, WITH ACUTE EXACERBATION 06/24/2008   BACK PAIN 01/29/2009   Cardiac arrest [facility])    CHEST PAIN 06/24/2008   Coronary artery calcification seen on CT scan    Family history of adverse reaction to anesthesia    Patients daughter has N/V after anesthesia   GERD (gastroesophageal reflux disease)    History of shingles    HYPERLIPIDEMIA 01/29/2007   HYPERTENSION 01/29/2007   OSTEOPENIA 10/16/2007   Type II or unspecified type diabetes mellitus without mention of complication, uncontrolled 11/29/2013    Past Surgical History:  Procedure Laterality Date   CHOLECYSTECTOMY N/A 09/19/2015   Procedure: LAPAROSCOPIC CHOLECYSTECTOMY WITH INTRAOPERATIVE CHOLANGIOGRAM;  Surgeon: Avel Peace, MD;  Location: [facility] OR;  Service: General;  Laterality: N/A;   COLONOSCOPY     TONSILLECTOMY  1956   VIDEO BRONCHOSCOPY Bilateral 03/28/2014   Procedure: VIDEO BRONCHOSCOPY WITHOUT FLUORO;  Surgeon: Nyoka Cowden, MD;  Location: [facility] ENDOSCOPY;  Service: Cardiopulmonary;  Laterality: Bilateral;    Family History  Problem Relation Age of Onset   Cancer Mother        ? type    Diabetes Father    Hypertension Father  Heart disease Father    Asthma Father    Asthma Daughter    Colon cancer Neg Hx    Esophageal cancer Neg Hx    Rectal cancer Neg Hx    Stomach cancer Neg Hx    Breast cancer Neg Hx     Social Connections: Moderately Integrated (11/09/2022)   Social Connection and Isolation Panel [NHANES]    Frequency of Communication with Friends and Family: More than three times a week    Frequency of Social Gatherings  with Friends and Family: More than three times a week    Attends Religious Services: More than 4 times per year    Active Member of [employer] or Organizations: Yes    Attends Engineer, structural: More than 4 times per year    Marital Status: Never married      Current Outpatient Medications:    Accu-Chek Softclix Lancets lancets, Use as directed up to four times daily as directed, Disp: 100 each, Rfl: 0   acetaminophen (TYLENOL) 325 MG tablet, Take 1-2 tablets (325-650 mg total) by mouth every 4 (four) hours as needed for mild pain., Disp: , Rfl:    amLODipine (NORVASC) 2.5 MG tablet, Take 3 tablets (7.5 mg total) by mouth daily., Disp: 90 tablet, Rfl: 3   apixaban (ELIQUIS) 5 MG TABS tablet, Take 1 tablet (5 mg total) by mouth 2 (two) times daily., Disp: 60 tablet, Rfl: 4   blood glucose meter kit and supplies, Use up to four times daily as directed., Disp: 1 each, Rfl: 0   clotrimazole (MYCELEX) 10 MG troche, Take 1 tablet (10 mg total) by mouth 5 (five) times daily for 7 days., Disp: 35 tablet, Rfl: 0   fluconazole (DIFLUCAN) 150 MG tablet, Take 1 tablet (150 mg total) by mouth daily., Disp: 1 tablet, Rfl: 0   furosemide (LASIX) 20 MG tablet, Take 1 tablet (20 mg total) by mouth daily., Disp: 90 tablet, Rfl: 3   glimepiride (AMARYL) 2 MG tablet, Take 1 tablet (2 mg total) by mouth daily before breakfast., Disp: 90 tablet, Rfl: 3   glucose blood test strip, Use as directed up to four times daily as directed, Disp: 400 each, Rfl: 3   HYDROcodone-acetaminophen (NORCO/VICODIN) 5-325 MG tablet, Take 1 tablet by mouth every 4 (four) hours as needed for moderate pain., Disp: 20 tablet, Rfl: 0   insulin aspart (NOVOLOG FLEXPEN) 100 UNIT/ML FlexPen, Inject 15 Units into the skin 3 (three) times daily with meals., Disp: 30 mL, Rfl: 4   insulin glargine (LANTUS) 100 UNIT/ML Solostar Pen, Inject 20 Units into the skin 2 (two) times daily., Disp: 15 mL, Rfl: 11   Insulin Pen Needle (CAREFINE PEN  NEEDLES) 32G X 4 MM MISC, Use to inject insulin 4 times daily., Disp: 100 each, Rfl: 0   levothyroxine (SYNTHROID) 75 MCG tablet, Take 1 tablet (75 mcg total) by mouth daily at 6 (six) AM., Disp: 90 tablet, Rfl: 3   lidocaine (LIDODERM) 5 %, Place 3 patches onto the skin daily. Remove & Discard patch within 12 hours or as directed by MD, Disp: 30 patch, Rfl: 0   losartan (COZAAR) 25 MG tablet, Take 1 tablet (25 mg total) by mouth daily., Disp: 90 tablet, Rfl: 3   magnesium oxide (MAG-OX) 400 MG tablet, Take 1 tablet (400 mg total) by mouth daily., Disp: 90 tablet, Rfl: 3   Multiple Vitamin (MULTIVITAMIN WITH MINERALS) TABS tablet, Take 1 tablet by mouth daily., Disp: , Rfl:  oxyCODONE-acetaminophen (PERCOCET/ROXICET) 5-325 MG tablet, Take 1 tablet by mouth every 4 (four) hours as needed for severe pain., Disp: 15 tablet, Rfl: 0   potassium chloride SA (KLOR-CON M) 20 MEQ tablet, Take 1 tablet (20 mEq total) by mouth daily., Disp: 90 tablet, Rfl: 3   rosuvastatin (CRESTOR) 20 MG tablet, Take 1 tablet (20 mg total) by mouth daily., Disp: 90 tablet, Rfl: 3  A 10-point ROS was performed with pertinent positives/negatives noted in the HPI Physical Exam:   BP (!) 93/59 (BP Location: Right Arm, Patient Position: Sitting, Cuff Size: Normal)   Pulse 81   Ht 5\' 2"  (1.575 m)   Wt 160 lb 12.8 oz (72.9 kg)   SpO2 99%   BMI 29.41 kg/m    Salient findings:  CN II-XII intact; no significant facial bruising Bilateral EAC clear and TM intact with well pneumatized middle ear spaces Anterior rhinoscopy shows septal deviation left; R > L IT hypertrophy. No septal hematoma. No nasal bone mobility, minimal S shaped curvature. No lesions of oral cavity/oropharynx; patient is edentulous; alveolar ridge and palate today is stable, no lesions; midface itself is not mobile however No other significant midface mobility; no trismus No obviously palpable neck masses/lymphadenopathy/thyromegaly No respiratory distress  or stridor Upper lip incision healing appropriately - sutures removed today  Independent Review of Additional Tests or Records:  CT face imaging independently reviewed 12/2022: fairly severe septal fracture, without significant nasal bone fracture and bilateral anterior and posterior maxillary wall fracture. There was some motion artifact so image quality is suboptimal. Mastoids well aerated, no mandible or frontal sinus fracture noted; small maxillary alveolar ridge fracture noted and a midline palatal fracture (minimal comminution). Pterygoids seem intact.  Procedures:  PROCEDURE NOTE: Preoperative diagnosis: upper lip laceration suture repair Postoperative diagnosis: same  Procedure: Removal of upper lip laceration sutures Indication: see above; retained suture EBL: 0 mL Surgeon: Jovita Kussmaul, MD   Procedure: The patient was identified and properly positioned. The lip laceration was well healed and nylon sutures removed. No dehiscence or infection. Patient tolerated procedure well.  Impression & Plans:  Deborah Neal is a 72 y.o. female with facial trauma after a fall. Multiple fractures including septal fracture, likely nasal bone fracture, bilateral maxillary A + P posterior wall fractures, maxillary alveolar ridge fracture, and small palatal fracture. She also is on eliquis due to prior PE with epistaxis which was treated at last visit with resolution.   We discussed her options regarding fractures -  Septal fracture and maxillary wall fractures: - Given extent, would be a septal reduction candidate or septoplasty - She reports no trouble breathing out of nose so does not wish for it  2. Palate and alveolar ridge fracture palate fixation -- not interested and she wears dentures -- may have to get them adjusted in the future.  - palate and ridge stable today; some loss of projection noted but she again wishes to avoid any procedures - f/u in 6 weeks to ensure stability; continue soft  diet 3. Lip sutures - placed by ED and removed today 4. Epistaxis - resolved; continue eliquis  RTC 6 weeks  MDM:  Level 3: 99213 Complexity/Problems addressed: low Data complexity: low - Morbidity: low - Prescription Drug prescribed or managed: no  Thank you for allowing me the opportunity to care for your patient. Please do not hesitate to contact me should you have any other questions.  Sincerely, Jovita Kussmaul, MD Otolarynoglogist (ENT), [facility] ENT Specialist Phone: [phone] Fax:  [phone]  01/27/2023, 9:15 AM

## 2023-02-01 ENCOUNTER — Ambulatory Visit: Payer: Medicare PPO | Admitting: Endocrinology

## 2023-02-14 ENCOUNTER — Other Ambulatory Visit: Payer: Self-pay

## 2023-02-14 ENCOUNTER — Other Ambulatory Visit (HOSPITAL_COMMUNITY): Payer: Self-pay

## 2023-02-14 ENCOUNTER — Other Ambulatory Visit: Payer: Self-pay | Admitting: Internal Medicine

## 2023-02-14 MED ORDER — AMLODIPINE BESYLATE 2.5 MG PO TABS
7.5000 mg | ORAL_TABLET | Freq: Every day | ORAL | 3 refills | Status: DC
  Filled 2023-02-14: qty 90, 30d supply, fill #0
  Filled 2023-03-14: qty 90, 30d supply, fill #1
  Filled 2023-04-13: qty 90, 30d supply, fill #2
  Filled 2023-05-09: qty 90, 30d supply, fill #3

## 2023-02-21 ENCOUNTER — Telehealth: Payer: Self-pay

## 2023-02-21 NOTE — Patient Outreach (Signed)
Care Coordination   02/21/2023 Name: Deborah Neal MRN: [id number] DOB: 30-Apr-1950   Care Coordination Outreach Attempts:  An unsuccessful telephone outreach was attempted today to offer the patient information about available care coordination services.  Follow Up Plan:  Additional outreach attempts will be made to offer the patient care coordination information and services.   Encounter Outcome:  No Answer   Care Coordination Interventions:  No, not indicated    Kathyrn Sheriff, RN, MSN, BSN, CCM Care Management Coordinator [phone]

## 2023-02-24 ENCOUNTER — Other Ambulatory Visit (HOSPITAL_COMMUNITY): Payer: Self-pay

## 2023-03-02 ENCOUNTER — Telehealth: Payer: Self-pay | Admitting: *Deleted

## 2023-03-02 NOTE — Progress Notes (Signed)
  Care Coordination Note  03/02/2023 Name: Deborah Neal MRN: [id number] DOB: 1950/07/03  Deborah Neal is a 72 y.o. year old female who is a primary care patient of Corwin Levins, MD and is actively engaged with the care management team. I reached out to Richelle Ito by phone today to assist with re-scheduling a follow up visit with the RN Case Manager  Follow up plan: Unsuccessful telephone outreach attempt made. A HIPAA compliant phone message was left for the patient providing contact information and requesting a return call.   Burman Nieves, CCMA Care Coordination Care Guide Direct Dial: [phone]

## 2023-03-04 ENCOUNTER — Encounter: Payer: Self-pay | Admitting: Internal Medicine

## 2023-03-04 ENCOUNTER — Ambulatory Visit (INDEPENDENT_AMBULATORY_CARE_PROVIDER_SITE_OTHER): Payer: Medicare HMO | Admitting: Internal Medicine

## 2023-03-04 VITALS — BP 122/64 | HR 90 | Temp 98.4°F | Ht 62.0 in | Wt 171.0 lb

## 2023-03-04 DIAGNOSIS — Z23 Encounter for immunization: Secondary | ICD-10-CM | POA: Diagnosis not present

## 2023-03-04 DIAGNOSIS — E119 Type 2 diabetes mellitus without complications: Secondary | ICD-10-CM | POA: Diagnosis not present

## 2023-03-04 DIAGNOSIS — E7800 Pure hypercholesterolemia, unspecified: Secondary | ICD-10-CM | POA: Diagnosis not present

## 2023-03-04 DIAGNOSIS — E559 Vitamin D deficiency, unspecified: Secondary | ICD-10-CM

## 2023-03-04 DIAGNOSIS — I10 Essential (primary) hypertension: Secondary | ICD-10-CM

## 2023-03-04 NOTE — Progress Notes (Unsigned)
Patient ID: Deborah Neal, female   DOB: 02/14/1951, 72 y.o.   MRN: [id number]        Chief Complaint: follow up HTN, HLD and hyperglycemia ***       HPI:  Deborah Neal is a 72 y.o. female here with c/o         For flu shot today Wt Readings from Last 3 Encounters:  03/04/23 171 lb (77.6 kg)  01/27/23 160 lb 12.8 oz (72.9 kg)  01/20/23 169 lb (76.7 kg)   BP Readings from Last 3 Encounters:  03/04/23 122/64  01/27/23 (!) 93/59  01/20/23 132/81         Past Medical History:  Diagnosis Date   Acute massive pulmonary embolism (HCC)    ALLERGIC RHINITIS 01/29/2007   Aortic atherosclerosis (HCC)    Arthritis    ASTHMA 01/29/2007   ASTHMA, WITH ACUTE EXACERBATION 06/24/2008   BACK PAIN 01/29/2009   Cardiac arrest [facility])    CHEST PAIN 06/24/2008   Coronary artery calcification seen on CT scan    Family history of adverse reaction to anesthesia    Patients daughter has N/V after anesthesia   GERD (gastroesophageal reflux disease)    History of shingles    HYPERLIPIDEMIA 01/29/2007   HYPERTENSION 01/29/2007   OSTEOPENIA 10/16/2007   Type II or unspecified type diabetes mellitus without mention of complication, uncontrolled 11/29/2013   Past Surgical History:  Procedure Laterality Date   CHOLECYSTECTOMY N/A 09/19/2015   Procedure: LAPAROSCOPIC CHOLECYSTECTOMY WITH INTRAOPERATIVE CHOLANGIOGRAM;  Surgeon: Avel Peace, MD;  Location: [facility] OR;  Service: General;  Laterality: N/A;   COLONOSCOPY     TONSILLECTOMY  1956   VIDEO BRONCHOSCOPY Bilateral 03/28/2014   Procedure: VIDEO BRONCHOSCOPY WITHOUT FLUORO;  Surgeon: Nyoka Cowden, MD;  Location: [facility] ENDOSCOPY;  Service: Cardiopulmonary;  Laterality: Bilateral;    reports that she quit smoking about 19 years ago. Her smoking use included cigarettes. She has been exposed to tobacco smoke. She has never used smokeless tobacco. She reports that she does not drink alcohol and does not use drugs. family history includes Asthma in her  daughter and father; Cancer in her mother; Diabetes in her father; Heart disease in her father; Hypertension in her father. Allergies  Allergen Reactions   Shingrix [Zoster Vac Recomb Adjuvanted]     Allergy to original live zoster - rash   Current Outpatient Medications on File Prior to Visit  Medication Sig Dispense Refill   Accu-Chek Softclix Lancets lancets Use as directed up to four times daily as directed 100 each 0   acetaminophen (TYLENOL) 325 MG tablet Take 1-2 tablets (325-650 mg total) by mouth every 4 (four) hours as needed for mild pain.     amLODipine (NORVASC) 2.5 MG tablet Take 3 tablets (7.5 mg total) by mouth daily. 90 tablet 3   apixaban (ELIQUIS) 5 MG TABS tablet Take 1 tablet (5 mg total) by mouth 2 (two) times daily. 60 tablet 4   blood glucose meter kit and supplies Use up to four times daily as directed. 1 each 0   fluconazole (DIFLUCAN) 150 MG tablet Take 1 tablet (150 mg total) by mouth daily. 1 tablet 0   furosemide (LASIX) 20 MG tablet Take 1 tablet (20 mg total) by mouth daily. 90 tablet 3   glimepiride (AMARYL) 2 MG tablet Take 1 tablet (2 mg total) by mouth daily before breakfast. 90 tablet 3   glucose blood test strip Use as directed up to four times  daily as directed 400 each 3   HYDROcodone-acetaminophen (NORCO/VICODIN) 5-325 MG tablet Take 1 tablet by mouth every 4 (four) hours as needed for moderate pain. 20 tablet 0   insulin aspart (NOVOLOG FLEXPEN) 100 UNIT/ML FlexPen Inject 15 Units into the skin 3 (three) times daily with meals. 30 mL 4   insulin glargine (LANTUS) 100 UNIT/ML Solostar Pen Inject 20 Units into the skin 2 (two) times daily. 15 mL 11   Insulin Pen Needle (CAREFINE PEN NEEDLES) 32G X 4 MM MISC Use to inject insulin 4 times daily. 100 each 0   levothyroxine (SYNTHROID) 75 MCG tablet Take 1 tablet (75 mcg total) by mouth daily at 6 (six) AM. 90 tablet 3   lidocaine (LIDODERM) 5 % Place 3 patches onto the skin daily. Remove & Discard patch  within 12 hours or as directed by MD 30 patch 0   losartan (COZAAR) 25 MG tablet Take 1 tablet (25 mg total) by mouth daily. 90 tablet 3   magnesium oxide (MAG-OX) 400 MG tablet Take 1 tablet (400 mg total) by mouth daily. 90 tablet 3   Multiple Vitamin (MULTIVITAMIN WITH MINERALS) TABS tablet Take 1 tablet by mouth daily.     oxyCODONE-acetaminophen (PERCOCET/ROXICET) 5-325 MG tablet Take 1 tablet by mouth every 4 (four) hours as needed for severe pain. 15 tablet 0   potassium chloride SA (KLOR-CON M) 20 MEQ tablet Take 1 tablet (20 mEq total) by mouth daily. 90 tablet 3   rosuvastatin (CRESTOR) 20 MG tablet Take 1 tablet (20 mg total) by mouth daily. 90 tablet 3   [DISCONTINUED] lisdexamfetamine (VYVANSE) 70 MG capsule Take 1 capsule (70 mg total) by mouth daily. 30 capsule 0   No current facility-administered medications on file prior to visit.        ROS:  All others reviewed and negative.  Objective        PE:  BP 122/64 (BP Location: Right Arm, Patient Position: Sitting, Cuff Size: Normal)   Pulse 90   Temp 98.4 F (36.9 C) (Oral)   Ht 5\' 2"  (1.575 m)   Wt 171 lb (77.6 kg)   SpO2 98%   BMI 31.28 kg/m                 Constitutional: Pt appears in NAD               HENT: Head: NCAT.                Right Ear: External ear normal.                 Left Ear: External ear normal.                Eyes: . Pupils are equal, round, and reactive to light. Conjunctivae and EOM are normal               Nose: without d/c or deformity               Neck: Neck supple. Gross normal ROM               Cardiovascular: Normal rate and regular rhythm.                 Pulmonary/Chest: Effort normal and breath sounds without rales or wheezing.                Abd:  Soft, NT, ND, + BS, no organomegaly  Neurological: Pt is alert. At baseline orientation, motor grossly intact               Skin: Skin is warm. No rashes, no other new lesions, LE edema - ***               Psychiatric: Pt  behavior is normal without agitation   Micro: none  Cardiac tracings I have personally interpreted today:  none  Pertinent Radiological findings (summarize): none   Lab Results  Component Value Date   WBC 6.4 01/19/2023   HGB 12.1 01/19/2023   HCT 40.6 01/19/2023   PLT 272 01/19/2023   GLUCOSE 322 (H) 09/03/2022   CHOL 153 09/03/2022   TRIG 98.0 09/03/2022   HDL 51.20 09/03/2022   LDLDIRECT 153.4 01/29/2009   LDLCALC 82 09/03/2022   ALT 16 09/03/2022   AST 15 09/03/2022   NA 138 09/03/2022   K 3.9 09/03/2022   CL 101 09/03/2022   CREATININE 0.96 09/03/2022   BUN 19 09/03/2022   CO2 29 09/03/2022   TSH 0.75 09/03/2022   INR 1.1 01/19/2023   HGBA1C 15.0 (>) 12/21/2022   MICROALBUR 1.9 09/03/2022   Assessment/Plan:  Deborah Neal is a 72 y.o. Black or African American [2] female with  has a past medical history of Acute massive pulmonary embolism (HCC), ALLERGIC RHINITIS (01/29/2007), Aortic atherosclerosis (HCC), Arthritis, ASTHMA (01/29/2007), ASTHMA, WITH ACUTE EXACERBATION (06/24/2008), BACK PAIN (01/29/2009), Cardiac arrest [facility]), CHEST PAIN (06/24/2008), Coronary artery calcification seen on CT scan, Family history of adverse reaction to anesthesia, GERD (gastroesophageal reflux disease), History of shingles, HYPERLIPIDEMIA (01/29/2007), HYPERTENSION (01/29/2007), OSTEOPENIA (10/16/2007), and Type II or unspecified type diabetes mellitus without mention of complication, uncontrolled (11/29/2013).  No problem-specific Assessment & Plan notes found for this encounter.  Followup: No follow-ups on file.  Oliver Barre, MD 03/04/2023 11:18 AM [facility] - [facility] Internal Medicine

## 2023-03-04 NOTE — Patient Instructions (Signed)
Please continue all other medications as before, and refills have been done if requested.  Please have the pharmacy call with any other refills you may need.  Please continue your efforts at being more active, low cholesterol diet, and weight control.  Please keep your appointments with your specialists as you may have planned - endocrinology  Please make an Appointment to return in 6 months, or sooner if needed

## 2023-03-06 ENCOUNTER — Encounter: Payer: Self-pay | Admitting: Internal Medicine

## 2023-03-06 NOTE — Assessment & Plan Note (Signed)
Last vitamin D Lab Results  Component Value Date   VD25OH 37.67 09/03/2022   Low, to start oral replacement

## 2023-03-06 NOTE — Assessment & Plan Note (Signed)
BP Readings from Last 3 Encounters:  03/04/23 122/64  01/27/23 (!) 93/59  01/20/23 132/81   Stable, pt to continue medical treatment norvasc 2.5 mg every day, losartan 25 mg qd

## 2023-03-06 NOTE — Assessment & Plan Note (Signed)
Lab Results  Component Value Date   HGBA1C 15.0 (>) 12/21/2022   Severe uncontrolled, suspect element of non compliance, pt to continue current medical treatment glimeparide 2 mg d, novolog 15 u tid, lantus 20 u bid, f/u endo as planned

## 2023-03-06 NOTE — Assessment & Plan Note (Signed)
Lab Results  Component Value Date   LDLCALC 82 09/03/2022   Uncontrolled, goal ldl < 70, pt to continue current statin crestor 20 mg every day, lower chol diet, has just restarted recently so will need recheck next visit

## 2023-03-10 NOTE — Progress Notes (Signed)
  Care Coordination Note  03/10/2023 Name: Deborah Neal MRN: [id number] DOB: May 06, 1950  Deborah Neal is a 72 y.o. year old female who is a primary care patient of Corwin Levins, MD and is actively engaged with the care management team. I reached out to Richelle Ito by phone today to assist with re-scheduling a follow up visit with the RN Case Manager  Follow up plan: We have been unable to make contact with the patient for follow up.   Burman Nieves, CCMA Care Coordination Care Guide Direct Dial: [phone]

## 2023-03-11 ENCOUNTER — Telehealth (INDEPENDENT_AMBULATORY_CARE_PROVIDER_SITE_OTHER): Payer: Self-pay | Admitting: Otolaryngology

## 2023-03-11 NOTE — Telephone Encounter (Signed)
Left vm confirming appt and location

## 2023-03-14 ENCOUNTER — Ambulatory Visit (INDEPENDENT_AMBULATORY_CARE_PROVIDER_SITE_OTHER): Payer: Medicare HMO | Admitting: Otolaryngology

## 2023-03-14 ENCOUNTER — Encounter (INDEPENDENT_AMBULATORY_CARE_PROVIDER_SITE_OTHER): Payer: Self-pay

## 2023-03-14 VITALS — Ht 62.0 in | Wt 171.0 lb

## 2023-03-14 DIAGNOSIS — S0240DD Maxillary fracture, left side, subsequent encounter for fracture with routine healing: Secondary | ICD-10-CM

## 2023-03-14 DIAGNOSIS — S0240CD Maxillary fracture, right side, subsequent encounter for fracture with routine healing: Secondary | ICD-10-CM

## 2023-03-14 DIAGNOSIS — S0993XD Unspecified injury of face, subsequent encounter: Secondary | ICD-10-CM

## 2023-03-14 DIAGNOSIS — S022XXD Fracture of nasal bones, subsequent encounter for fracture with routine healing: Secondary | ICD-10-CM

## 2023-03-14 DIAGNOSIS — S0242XD Fracture of alveolus of maxilla, subsequent encounter for fracture with routine healing: Secondary | ICD-10-CM

## 2023-03-14 DIAGNOSIS — W19XXXD Unspecified fall, subsequent encounter: Secondary | ICD-10-CM

## 2023-03-14 DIAGNOSIS — S0280XD Fracture of other specified skull and facial bones, unspecified side, subsequent encounter for fracture with routine healing: Secondary | ICD-10-CM

## 2023-03-15 NOTE — Progress Notes (Signed)
Dear Dr. Jonny Ruiz, Here is my assessment for our mutual patient, Deborah Neal. Thank you for allowing me the opportunity to care for your patient. Please do not hesitate to contact me should you have any other questions. Sincerely, Dr. Jovita Kussmaul  Otolaryngology Clinic Note Referring provider: Dr. Jonny Ruiz HPI:  Deborah Neal is a 72 y.o. female kindly referred by Dr. Jonny Ruiz for evaluation of ED follow up after facial trauma. She had a fall on 9/17 and went to ED where they repaired a upper lip laceration and was discharged. She denies LOC. CT showed fairly severe septal fracture, nasal bone fracture and maxillary wall fracture. There was some motion artifact so image quality is suboptimal.  Initial evaluation (Sept 2024): She reports she had epistaxis on and off since the fall, but was worse yesterday so went to ED where she had a pope pack placed on left. She has continued to bleed, mostly on left. She is on eilquis from prior PE.  Patient reports today: she has some mouth pain and her front gums seem more recessed than lower gums (she is edentulous). No problems with incision. She is eating soft foods right now. Can't wear her dentures due to pain. Patient denies:  - other lacerations, trismus - enophthalmos, hypoglobus, vision loss or change,  - significant facial deformity - trouble swallowing - hearing loss after trauma - otorrhea, vertigo.   Unable to assess nasal obstruction due to pack but breathes ok from right side. She is on diflucan and clotrimazole due to diagnosis of thrush in ED yesterday.She reports she did not get any there antibitics with pack in place.  Prior eval (01/27/2023): Patient returns for follow up today for reassessment. She reports that she is doing very well. No bleeding from nose, has restarted eliquis. Not using afrin. She denies cosmetic deformity of nose, nasal obstruction. She was advised a soft diet but has been eating everything without issue. She reports she  actually does not wear dentures and eats everything without them.  We then decided to observe and she returns for follow up today  Today (03/14/2023): She returns for follow up. Doing well, no pain. Eating everything, no trouble swallowing or significant nasal obstruction, trismus or facial deformity. She is overall back to her functional status.  PMHx: hypertension, asthma, diabetes, previous cholecystectomy, pulmonary embolism on Eliquis therapy    PMH/Meds/All/SocHx/FamHx/ROS:   Past Medical History:  Diagnosis Date   Acute massive pulmonary embolism (HCC)    ALLERGIC RHINITIS 01/29/2007   Aortic atherosclerosis (HCC)    Arthritis    ASTHMA 01/29/2007   ASTHMA, WITH ACUTE EXACERBATION 06/24/2008   BACK PAIN 01/29/2009   Cardiac arrest [facility])    CHEST PAIN 06/24/2008   Coronary artery calcification seen on CT scan    Family history of adverse reaction to anesthesia    Patients daughter has N/V after anesthesia   GERD (gastroesophageal reflux disease)    History of shingles    HYPERLIPIDEMIA 01/29/2007   HYPERTENSION 01/29/2007   OSTEOPENIA 10/16/2007   Type II or unspecified type diabetes mellitus without mention of complication, uncontrolled 11/29/2013    Past Surgical History:  Procedure Laterality Date   CHOLECYSTECTOMY N/A 09/19/2015   Procedure: LAPAROSCOPIC CHOLECYSTECTOMY WITH INTRAOPERATIVE CHOLANGIOGRAM;  Surgeon: Avel Peace, MD;  Location: [facility] OR;  Service: General;  Laterality: N/A;   COLONOSCOPY     TONSILLECTOMY  1956   VIDEO BRONCHOSCOPY Bilateral 03/28/2014   Procedure: VIDEO BRONCHOSCOPY WITHOUT FLUORO;  Surgeon: Nyoka Cowden, MD;  Location: [facility] ENDOSCOPY;  Service: Cardiopulmonary;  Laterality: Bilateral;    Family History  Problem Relation Age of Onset   Cancer Mother        ? type    Diabetes Father    Hypertension Father    Heart disease Father    Asthma Father    Asthma Daughter    Colon cancer Neg Hx    Esophageal cancer Neg Hx    Rectal  cancer Neg Hx    Stomach cancer Neg Hx    Breast cancer Neg Hx     Social Connections: Moderately Integrated (11/09/2022)   Social Connection and Isolation Panel [NHANES]    Frequency of Communication with Friends and Family: More than three times a week    Frequency of Social Gatherings with Friends and Family: More than three times a week    Attends Religious Services: More than 4 times per year    Active Member of [employer] or Organizations: Yes    Attends Engineer, structural: More than 4 times per year    Marital Status: Never married      Current Outpatient Medications:    Accu-Chek Softclix Lancets lancets, Use as directed up to four times daily as directed, Disp: 100 each, Rfl: 0   acetaminophen (TYLENOL) 325 MG tablet, Take 1-2 tablets (325-650 mg total) by mouth every 4 (four) hours as needed for mild pain., Disp: , Rfl:    amLODipine (NORVASC) 2.5 MG tablet, Take 3 tablets (7.5 mg total) by mouth daily., Disp: 90 tablet, Rfl: 3   apixaban (ELIQUIS) 5 MG TABS tablet, Take 1 tablet (5 mg total) by mouth 2 (two) times daily., Disp: 60 tablet, Rfl: 4   blood glucose meter kit and supplies, Use up to four times daily as directed., Disp: 1 each, Rfl: 0   fluconazole (DIFLUCAN) 150 MG tablet, Take 1 tablet (150 mg total) by mouth daily., Disp: 1 tablet, Rfl: 0   furosemide (LASIX) 20 MG tablet, Take 1 tablet (20 mg total) by mouth daily., Disp: 90 tablet, Rfl: 3   glimepiride (AMARYL) 2 MG tablet, Take 1 tablet (2 mg total) by mouth daily before breakfast., Disp: 90 tablet, Rfl: 3   glucose blood test strip, Use as directed up to four times daily as directed, Disp: 400 each, Rfl: 3   HYDROcodone-acetaminophen (NORCO/VICODIN) 5-325 MG tablet, Take 1 tablet by mouth every 4 (four) hours as needed for moderate pain., Disp: 20 tablet, Rfl: 0   insulin aspart (NOVOLOG FLEXPEN) 100 UNIT/ML FlexPen, Inject 15 Units into the skin 3 (three) times daily with meals., Disp: 30 mL, Rfl: 4    insulin glargine (LANTUS) 100 UNIT/ML Solostar Pen, Inject 20 Units into the skin 2 (two) times daily., Disp: 15 mL, Rfl: 11   Insulin Pen Needle (CAREFINE PEN NEEDLES) 32G X 4 MM MISC, Use to inject insulin 4 times daily., Disp: 100 each, Rfl: 0   levothyroxine (SYNTHROID) 75 MCG tablet, Take 1 tablet (75 mcg total) by mouth daily at 6 (six) AM., Disp: 90 tablet, Rfl: 3   lidocaine (LIDODERM) 5 %, Place 3 patches onto the skin daily. Remove & Discard patch within 12 hours or as directed by MD, Disp: 30 patch, Rfl: 0   losartan (COZAAR) 25 MG tablet, Take 1 tablet (25 mg total) by mouth daily., Disp: 90 tablet, Rfl: 3   magnesium oxide (MAG-OX) 400 MG tablet, Take 1 tablet (400 mg total) by mouth daily., Disp: 90 tablet, Rfl: 3  Multiple Vitamin (MULTIVITAMIN WITH MINERALS) TABS tablet, Take 1 tablet by mouth daily., Disp: , Rfl:    oxyCODONE-acetaminophen (PERCOCET/ROXICET) 5-325 MG tablet, Take 1 tablet by mouth every 4 (four) hours as needed for severe pain., Disp: 15 tablet, Rfl: 0   potassium chloride SA (KLOR-CON M) 20 MEQ tablet, Take 1 tablet (20 mEq total) by mouth daily., Disp: 90 tablet, Rfl: 3   rosuvastatin (CRESTOR) 20 MG tablet, Take 1 tablet (20 mg total) by mouth daily., Disp: 90 tablet, Rfl: 3  A 10-point ROS was performed with pertinent positives/negatives noted in the HPI Physical Exam:   Ht 5\' 2"  (1.575 m)   Wt 171 lb (77.6 kg)   BMI 31.28 kg/m    Salient findings:  CN II-XII intact Bilateral EAC clear and TM intact with well pneumatized middle ear spaces Anterior rhinoscopy shows septal deviation left; R > L IT hypertrophy. No septal hematoma No lesions of oral cavity/oropharynx; patient is edentulous; alveolar ridge and palate today is stable, no lesions; midface stable No trismus No obviously palpable neck masses/lymphadenopathy/thyromegaly No respiratory distress or stridor Upper lip incision well healed  Independent Review of Additional Tests or Records:  CT  face imaging independently reviewed 12/2022: fairly severe septal fracture, without significant nasal bone fracture and bilateral anterior and posterior maxillary wall fracture. There was some motion artifact so image quality is suboptimal. Mastoids well aerated, no mandible or frontal sinus fracture noted; small maxillary alveolar ridge fracture noted and a midline palatal fracture (minimal comminution). Pterygoids seem intact.  Procedures:  None today  Impression & Plans:  Deborah Neal is a 72 y.o. female with facial trauma after a fall. Multiple fractures including septal fracture, likely nasal bone fracture, bilateral maxillary A + P posterior wall fractures, maxillary alveolar ridge fracture, and small palatal fracture. She also is on eliquis due to prior PE with epistaxis which was treated at last visit with resolution.   We discussed her options regarding fractures prior and she opted for non-operative management. She returns today for follow up  Septal fracture and maxillary wall fractures: - No nasal obstruction or reported cosmetic deformity - f/u PRN  2. Palate and alveolar ridge fracture - palate and ridge stable again, some loss of projection but functionally she reports she is doing well - Can advance to regular diet  - f/u PRN  MDM:  Level 3: 99213 Complexity/Problems addressed: low - chronic problem Data complexity: low - Morbidity: low - Prescription Drug prescribed or managed: no  Thank you for allowing me the opportunity to care for your patient. Please do not hesitate to contact me should you have any other questions.  Sincerely, Jovita Kussmaul, MD Otolarynoglogist (ENT), [facility] Phone: [phone] Fax: [phone]  03/15/2023, 5:22 PM

## 2023-03-15 NOTE — Patient Outreach (Signed)
  Care Coordination   Case Closure  Visit Note   03/15/2023 Name: Deborah Neal MRN: [id number] DOB: 27-Jul-1950  Deborah Neal is a 72 y.o. year old female who sees Corwin Levins, MD for primary care. No patient contact was made during this encounter.  RNCM received notification from care guide on 03/02/23: "We have been unable to make contact with the patient for follow up". Case closed.   Goals Addressed             This Visit's Progress    COMPLETED: Assist with Health management/education Diabetes       Interventions Today    Flowsheet Row Most Recent Value  General Interventions   General Interventions Discussed/Reviewed General Interventions Reviewed  [goals closed]            SDOH assessments and interventions completed:  No  Care Coordination Interventions:  No, not indicated   Follow up plan: No further intervention required.   Encounter Outcome:  Patient Visit Completed   Kathyrn Sheriff, RN, MSN, BSN, CCM Care Management Coordinator [phone]

## 2023-04-13 ENCOUNTER — Other Ambulatory Visit: Payer: Self-pay | Admitting: Internal Medicine

## 2023-04-13 ENCOUNTER — Other Ambulatory Visit: Payer: Self-pay

## 2023-04-13 ENCOUNTER — Other Ambulatory Visit (HOSPITAL_COMMUNITY): Payer: Self-pay

## 2023-04-13 MED ORDER — APIXABAN 5 MG PO TABS
5.0000 mg | ORAL_TABLET | Freq: Two times a day (BID) | ORAL | 4 refills | Status: DC
  Filled 2023-04-13: qty 60, 30d supply, fill #0
  Filled 2023-05-09: qty 60, 30d supply, fill #1
  Filled 2023-06-06: qty 60, 30d supply, fill #2
  Filled 2023-07-05: qty 60, 30d supply, fill #3
  Filled 2023-08-01: qty 60, 30d supply, fill #4

## 2023-04-15 ENCOUNTER — Other Ambulatory Visit (HOSPITAL_COMMUNITY): Payer: Self-pay

## 2023-05-01 DIAGNOSIS — J45909 Unspecified asthma, uncomplicated: Secondary | ICD-10-CM | POA: Diagnosis not present

## 2023-05-01 DIAGNOSIS — R5381 Other malaise: Secondary | ICD-10-CM | POA: Diagnosis not present

## 2023-05-01 DIAGNOSIS — G9340 Encephalopathy, unspecified: Secondary | ICD-10-CM | POA: Diagnosis not present

## 2023-05-01 DIAGNOSIS — I469 Cardiac arrest, cause unspecified: Secondary | ICD-10-CM | POA: Diagnosis not present

## 2023-05-04 ENCOUNTER — Telehealth: Payer: Self-pay

## 2023-05-04 NOTE — Progress Notes (Signed)
 Care Guide Pharmacy Note  05/04/2023 Name: Deborah Neal MRN: [id number] DOB: 04/23/1951  Referred By: Norleen Lynwood ORN, MD Reason for referral: Care Coordination ([facility] Diabetes. )   Deborah Neal is a 73 y.o. year old female who is a primary care patient of Norleen Lynwood ORN, MD.  Deborah Neal was referred to the pharmacist for assistance related to: DMII  An unsuccessful telephone outreach was attempted today to contact the patient who was referred to the pharmacy team for assistance with diabetes. Additional attempts will be made to contact the patient.  Dreama Lynwood [facility] Assistant [phone]

## 2023-05-06 ENCOUNTER — Telehealth: Payer: Self-pay

## 2023-05-06 NOTE — Progress Notes (Signed)
 Care Guide Pharmacy Note  05/06/2023 Name: Deborah Neal MRN: [id number] DOB: 1951/03/16  Referred By: Norleen Lynwood ORN, MD Reason for referral: Care Coordination ([facility] Diabetes. )   Deborah Neal is a 72 y.o. year old female who is a primary care patient of Norleen Lynwood ORN, MD.  Deborah Neal was referred to the pharmacist for assistance related to: DMII  A second unsuccessful telephone outreach was attempted today to contact the patient who was referred to the pharmacy team for assistance with diabetes. Additional attempts will be made to contact the patient.  Dreama Lynwood [facility] Assistant [phone]

## 2023-05-10 ENCOUNTER — Telehealth: Payer: Self-pay

## 2023-05-10 NOTE — Progress Notes (Signed)
 Care Guide Pharmacy Note  05/10/2023 Name: Deborah Neal MRN: [id number] DOB: 1950-07-28  Referred By: Norleen Lynwood ORN, MD Reason for referral: Care Coordination ([facility] Diabetes. )   Deborah Neal is a 73 y.o. year old female who is a primary care patient of Norleen Lynwood ORN, MD.  Deborah Neal was referred to the pharmacist for assistance related to: DMII  Successful contact was made with the patient to discuss pharmacy services.  Patient declines engagement at this time. Contact information was provided to the patient should they wish to reach out for assistance at a later time.  Dreama Lynwood [facility] Assistant [phone]

## 2023-05-20 ENCOUNTER — Other Ambulatory Visit: Payer: Self-pay

## 2023-05-20 ENCOUNTER — Other Ambulatory Visit (HOSPITAL_COMMUNITY): Payer: Self-pay

## 2023-05-20 ENCOUNTER — Other Ambulatory Visit: Payer: Self-pay | Admitting: Internal Medicine

## 2023-05-20 MED ORDER — ROSUVASTATIN CALCIUM 20 MG PO TABS
20.0000 mg | ORAL_TABLET | Freq: Every day | ORAL | 3 refills | Status: DC
  Filled 2023-05-20: qty 90, 90d supply, fill #0
  Filled 2023-08-16 – 2023-08-17 (×2): qty 90, 90d supply, fill #1
  Filled 2023-11-14: qty 90, 90d supply, fill #2
  Filled 2024-02-07: qty 90, 90d supply, fill #3

## 2023-05-20 MED ORDER — FUROSEMIDE 20 MG PO TABS
20.0000 mg | ORAL_TABLET | Freq: Every day | ORAL | 3 refills | Status: DC
  Filled 2023-05-20: qty 90, 90d supply, fill #0
  Filled 2023-08-16 – 2023-08-17 (×2): qty 90, 90d supply, fill #1
  Filled 2023-11-14: qty 90, 90d supply, fill #2
  Filled 2024-02-07: qty 90, 90d supply, fill #3

## 2023-05-20 MED ORDER — LOSARTAN POTASSIUM 25 MG PO TABS
25.0000 mg | ORAL_TABLET | Freq: Every day | ORAL | 3 refills | Status: DC
  Filled 2023-05-20: qty 90, 90d supply, fill #0
  Filled 2023-08-16 – 2023-08-17 (×2): qty 90, 90d supply, fill #1
  Filled 2023-11-14: qty 90, 90d supply, fill #2
  Filled 2024-02-07: qty 90, 90d supply, fill #3

## 2023-05-20 MED ORDER — LEVOTHYROXINE SODIUM 75 MCG PO TABS
75.0000 ug | ORAL_TABLET | Freq: Every day | ORAL | 3 refills | Status: DC
  Filled 2023-05-20: qty 90, 90d supply, fill #0
  Filled 2023-08-16: qty 90, 90d supply, fill #1
  Filled 2023-08-16: qty 30, 30d supply, fill #1
  Filled 2023-09-15: qty 30, 30d supply, fill #2
  Filled 2023-10-14: qty 30, 30d supply, fill #3
  Filled 2023-11-14: qty 30, 30d supply, fill #4
  Filled 2023-12-13: qty 30, 30d supply, fill #5
  Filled 2024-01-11: qty 30, 30d supply, fill #6
  Filled 2024-02-06: qty 30, 30d supply, fill #7
  Filled 2024-03-07: qty 30, 30d supply, fill #8
  Filled 2024-04-05: qty 30, 30d supply, fill #9

## 2023-06-06 ENCOUNTER — Other Ambulatory Visit: Payer: Self-pay

## 2023-06-06 ENCOUNTER — Other Ambulatory Visit: Payer: Self-pay | Admitting: Internal Medicine

## 2023-06-06 ENCOUNTER — Other Ambulatory Visit (HOSPITAL_COMMUNITY): Payer: Self-pay

## 2023-06-06 MED ORDER — AMLODIPINE BESYLATE 2.5 MG PO TABS
7.5000 mg | ORAL_TABLET | Freq: Every day | ORAL | 3 refills | Status: DC
  Filled 2023-06-06: qty 90, 30d supply, fill #0
  Filled 2023-07-05: qty 90, 30d supply, fill #1
  Filled 2023-08-01: qty 90, 30d supply, fill #2
  Filled 2023-08-29: qty 90, 30d supply, fill #3

## 2023-06-06 MED ORDER — POTASSIUM CHLORIDE CRYS ER 20 MEQ PO TBCR
20.0000 meq | EXTENDED_RELEASE_TABLET | Freq: Every day | ORAL | 3 refills | Status: DC
  Filled 2023-06-06: qty 90, 90d supply, fill #0
  Filled 2023-09-05: qty 90, 90d supply, fill #1
  Filled 2023-12-02: qty 90, 90d supply, fill #2
  Filled 2024-02-27: qty 90, 90d supply, fill #3

## 2023-06-17 ENCOUNTER — Telehealth: Payer: Self-pay

## 2023-06-17 NOTE — Telephone Encounter (Signed)
Patient was identified as falling into the [place] Measure - Diabetes.   Patient was: Appointment scheduled for lab or office visit for A1c.  Patient follows up with pcp every 6 months for diabetes.

## 2023-07-12 ENCOUNTER — Other Ambulatory Visit: Payer: Self-pay | Admitting: Internal Medicine

## 2023-07-12 ENCOUNTER — Encounter: Payer: Self-pay | Admitting: Internal Medicine

## 2023-07-12 ENCOUNTER — Ambulatory Visit
Admission: RE | Admit: 2023-07-12 | Discharge: 2023-07-12 | Disposition: A | Discharge status: Home / Self Care | Payer: PRIVATE HEALTH INSURANCE | Source: Ambulatory Visit | Attending: Internal Medicine | Admitting: Internal Medicine

## 2023-07-12 ENCOUNTER — Other Ambulatory Visit (HOSPITAL_COMMUNITY): Payer: Self-pay

## 2023-07-12 DIAGNOSIS — E2839 Other primary ovarian failure: Secondary | ICD-10-CM | POA: Diagnosis not present

## 2023-07-12 DIAGNOSIS — N958 Other specified menopausal and perimenopausal disorders: Secondary | ICD-10-CM | POA: Diagnosis not present

## 2023-07-12 DIAGNOSIS — M8588 Other specified disorders of bone density and structure, other site: Secondary | ICD-10-CM | POA: Diagnosis not present

## 2023-07-12 DIAGNOSIS — Z13820 Encounter for screening for osteoporosis: Secondary | ICD-10-CM

## 2023-07-12 MED ORDER — ALENDRONATE SODIUM 70 MG PO TABS
70.0000 mg | ORAL_TABLET | ORAL | 3 refills | Status: AC
  Filled 2023-07-12: qty 12, 84d supply, fill #0
  Filled 2023-10-04: qty 12, 84d supply, fill #1
  Filled 2023-12-27: qty 12, 84d supply, fill #2
  Filled 2024-03-20: qty 12, 84d supply, fill #3

## 2023-08-04 ENCOUNTER — Other Ambulatory Visit: Payer: Self-pay

## 2023-08-04 ENCOUNTER — Other Ambulatory Visit: Payer: Self-pay | Admitting: Internal Medicine

## 2023-08-04 ENCOUNTER — Other Ambulatory Visit (HOSPITAL_COMMUNITY): Payer: Self-pay

## 2023-08-04 MED ORDER — BD PEN NEEDLE NANO U/F 32G X 4 MM MISC
1.0000 | Freq: Four times a day (QID) | 0 refills | Status: AC
Start: 2023-08-04 — End: ?
  Filled 2023-08-04: qty 100, 25d supply, fill #0

## 2023-08-04 NOTE — Telephone Encounter (Signed)
 Duplicate request, mediation sent today. Cancelled reorder and closing encounter.

## 2023-08-04 NOTE — Telephone Encounter (Signed)
 Copied from CRM [id number]. Topic: Clinical - Medication Refill >> Aug 04, 2023  1:48 PM Quay Burow wrote: Most Recent Primary Care Visit:  Provider: Corwin Levins  Department: [facility]  Visit Type: OFFICE VISIT  Date: 03/04/2023  Medication: Insulin Pen Needle (BD PEN NEEDLE NANO U/F) 32G X 4 MM MISC  Has the patient contacted their pharmacy? Yes (Agent: If no, request that the patient contact the pharmacy for the refill. If patient does not wish to contact the pharmacy document the reason why and proceed with request.) (Agent: If yes, when and what did the pharmacy advise?)  Is this the correct pharmacy for this prescription? Yes If no, delete pharmacy and type the correct one.  This is the patient's preferred pharmacy:  [facility] [address] [place] [address] Phone: [phone] Fax: [phone]   Has the prescription been filled recently? Yes  Is the patient out of the medication? Yes  Has the patient been seen for an appointment in the last year OR does the patient have an upcoming appointment? Yes  Can we respond through MyChart? No  Agent: Please be advised that Rx refills may take up to 3 business days. We ask that you follow-up with your pharmacy.

## 2023-08-16 ENCOUNTER — Other Ambulatory Visit (HOSPITAL_COMMUNITY): Payer: Self-pay

## 2023-08-16 ENCOUNTER — Other Ambulatory Visit: Payer: Self-pay

## 2023-08-18 ENCOUNTER — Other Ambulatory Visit (HOSPITAL_COMMUNITY): Payer: Self-pay

## 2023-08-29 ENCOUNTER — Other Ambulatory Visit (HOSPITAL_COMMUNITY): Payer: Self-pay

## 2023-08-29 ENCOUNTER — Other Ambulatory Visit: Payer: Self-pay | Admitting: Internal Medicine

## 2023-08-29 ENCOUNTER — Other Ambulatory Visit: Payer: Self-pay

## 2023-08-29 MED ORDER — APIXABAN 5 MG PO TABS
5.0000 mg | ORAL_TABLET | Freq: Two times a day (BID) | ORAL | 4 refills | Status: DC
  Filled 2023-08-29: qty 60, 30d supply, fill #0
  Filled 2023-09-27: qty 60, 30d supply, fill #1
  Filled 2023-10-26: qty 60, 30d supply, fill #2
  Filled 2023-11-24: qty 60, 30d supply, fill #3
  Filled 2023-12-19: qty 60, 30d supply, fill #4

## 2023-09-01 ENCOUNTER — Ambulatory Visit (INDEPENDENT_AMBULATORY_CARE_PROVIDER_SITE_OTHER): Payer: PRIVATE HEALTH INSURANCE | Admitting: Internal Medicine

## 2023-09-01 ENCOUNTER — Other Ambulatory Visit (HOSPITAL_COMMUNITY): Payer: Self-pay

## 2023-09-01 ENCOUNTER — Other Ambulatory Visit: Payer: Self-pay | Admitting: Internal Medicine

## 2023-09-01 ENCOUNTER — Encounter: Payer: Self-pay | Admitting: Internal Medicine

## 2023-09-01 VITALS — BP 124/82 | HR 86 | Temp 98.4°F | Ht 62.0 in | Wt 178.0 lb

## 2023-09-01 DIAGNOSIS — Z7984 Long term (current) use of oral hypoglycemic drugs: Secondary | ICD-10-CM

## 2023-09-01 DIAGNOSIS — E7800 Pure hypercholesterolemia, unspecified: Secondary | ICD-10-CM | POA: Diagnosis not present

## 2023-09-01 DIAGNOSIS — Z860100 Personal history of colon polyps, unspecified: Secondary | ICD-10-CM | POA: Diagnosis not present

## 2023-09-01 DIAGNOSIS — E119 Type 2 diabetes mellitus without complications: Secondary | ICD-10-CM

## 2023-09-01 DIAGNOSIS — E559 Vitamin D deficiency, unspecified: Secondary | ICD-10-CM

## 2023-09-01 DIAGNOSIS — I10 Essential (primary) hypertension: Secondary | ICD-10-CM | POA: Diagnosis not present

## 2023-09-01 DIAGNOSIS — Z7985 Long-term (current) use of injectable non-insulin antidiabetic drugs: Secondary | ICD-10-CM

## 2023-09-01 DIAGNOSIS — Z0001 Encounter for general adult medical examination with abnormal findings: Secondary | ICD-10-CM

## 2023-09-01 DIAGNOSIS — Z0000 Encounter for general adult medical examination without abnormal findings: Secondary | ICD-10-CM | POA: Diagnosis not present

## 2023-09-01 DIAGNOSIS — E538 Deficiency of other specified B group vitamins: Secondary | ICD-10-CM | POA: Diagnosis not present

## 2023-09-01 LAB — CBC WITH DIFFERENTIAL/PLATELET
Basophils Absolute: 0.1 10*3/uL (ref 0.0–0.1)
Basophils Relative: 1.2 % (ref 0.0–3.0)
Eosinophils Absolute: 0.1 10*3/uL (ref 0.0–0.7)
Eosinophils Relative: 1.3 % (ref 0.0–5.0)
HCT: 42.1 % (ref 36.0–46.0)
Hemoglobin: 13.1 g/dL (ref 12.0–15.0)
Lymphocytes Relative: 35.3 % (ref 12.0–46.0)
Lymphs Abs: 2.2 10*3/uL (ref 0.7–4.0)
MCHC: 31.2 g/dL (ref 30.0–36.0)
MCV: 73.5 fl — ABNORMAL LOW (ref 78.0–100.0)
Monocytes Absolute: 0.4 10*3/uL (ref 0.1–1.0)
Monocytes Relative: 5.9 % (ref 3.0–12.0)
Neutro Abs: 3.5 10*3/uL (ref 1.4–7.7)
Neutrophils Relative %: 56.3 % (ref 43.0–77.0)
Platelets: 207 10*3/uL (ref 150.0–400.0)
RBC: 5.72 Mil/uL — ABNORMAL HIGH (ref 3.87–5.11)
RDW: 16.2 % — ABNORMAL HIGH (ref 11.5–15.5)
WBC: 6.3 10*3/uL (ref 4.0–10.5)

## 2023-09-01 LAB — HEPATIC FUNCTION PANEL
ALT: 19 U/L (ref 0–35)
AST: 18 U/L (ref 0–37)
Albumin: 4.5 g/dL (ref 3.5–5.2)
Alkaline Phosphatase: 111 U/L (ref 39–117)
Bilirubin, Direct: 0.1 mg/dL (ref 0.0–0.3)
Total Bilirubin: 0.3 mg/dL (ref 0.2–1.2)
Total Protein: 7.6 g/dL (ref 6.0–8.3)

## 2023-09-01 LAB — BASIC METABOLIC PANEL WITH GFR
BUN: 24 mg/dL — ABNORMAL HIGH (ref 6–23)
CO2: 29 meq/L (ref 19–32)
Calcium: 9.8 mg/dL (ref 8.4–10.5)
Chloride: 100 meq/L (ref 96–112)
Creatinine, Ser: 1.01 mg/dL (ref 0.40–1.20)
GFR: 55.74 mL/min — ABNORMAL LOW (ref >60.00)
Glucose, Bld: 266 mg/dL — ABNORMAL HIGH (ref 70–99)
Potassium: 3.7 meq/L (ref 3.5–5.1)
Sodium: 138 meq/L (ref 135–145)

## 2023-09-01 LAB — LIPID PANEL
Cholesterol: 150 mg/dL (ref 0–200)
HDL: 46.2 mg/dL (ref >39.00)
LDL Cholesterol: 81 mg/dL (ref 0–99)
NonHDL: 103.81
Total CHOL/HDL Ratio: 3
Triglycerides: 115 mg/dL (ref 0.0–149.0)
VLDL: 23 mg/dL (ref 0.0–40.0)

## 2023-09-01 LAB — URINALYSIS, ROUTINE W REFLEX MICROSCOPIC
Bilirubin Urine: NEGATIVE
Hgb urine dipstick: NEGATIVE
Ketones, ur: NEGATIVE
Nitrite: NEGATIVE
RBC / HPF: NONE SEEN (ref 0–?)
Specific Gravity, Urine: 1.02 (ref 1.000–1.030)
Total Protein, Urine: NEGATIVE
Urine Glucose: 500 — AB
Urobilinogen, UA: 0.2 (ref 0.0–1.0)
pH: 6 (ref 5.0–8.0)

## 2023-09-01 LAB — TSH: TSH: 2.28 u[IU]/mL (ref 0.35–5.50)

## 2023-09-01 LAB — MICROALBUMIN / CREATININE URINE RATIO
Creatinine,U: 231 mg/dL
Microalb Creat Ratio: 10.8 mg/g (ref 0.0–30.0)
Microalb, Ur: 2.5 mg/dL — ABNORMAL HIGH (ref 0.0–1.9)

## 2023-09-01 LAB — VITAMIN D 25 HYDROXY (VIT D DEFICIENCY, FRACTURES): VITD: 39.93 ng/mL (ref 30.00–100.00)

## 2023-09-01 LAB — HEMOGLOBIN A1C: Hgb A1c MFr Bld: 12.4 % — ABNORMAL HIGH (ref 4.6–6.5)

## 2023-09-01 LAB — VITAMIN B12: Vitamin B-12: 974 pg/mL — ABNORMAL HIGH (ref 211–911)

## 2023-09-01 MED ORDER — TIRZEPATIDE 2.5 MG/0.5ML ~~LOC~~ SOAJ
2.5000 mg | SUBCUTANEOUS | 11 refills | Status: DC
  Filled 2023-09-01: qty 2, 28d supply, fill #0
  Filled 2023-09-23: qty 2, 28d supply, fill #1
  Filled 2023-10-21: qty 2, 28d supply, fill #2
  Filled 2023-11-18: qty 2, 28d supply, fill #3
  Filled 2023-12-14: qty 2, 28d supply, fill #4
  Filled 2024-01-11: qty 2, 28d supply, fill #5
  Filled 2024-02-03: qty 2, 28d supply, fill #6
  Filled 2024-03-02: qty 2, 28d supply, fill #7

## 2023-09-01 NOTE — Assessment & Plan Note (Signed)
 Last vitamin D  Lab Results  Component Value Date   VD25OH 39.93 09/01/2023   Low, to start oral replacement

## 2023-09-01 NOTE — Assessment & Plan Note (Signed)
Age and sex appropriate education and counseling updated with regular exercise and diet Referrals for preventative services - for colonoscopy Immunizations addressed - for shingrix at pharmacy Smoking counseling  - none needed Evidence for depression or other mood disorder - none significant Most recent labs reviewed. I have personally reviewed and have noted: 1) the patient's medical and social history 2) The patient's current medications and supplements 3) The patient's height, weight, and BMI have been recorded in the chart

## 2023-09-01 NOTE — Assessment & Plan Note (Signed)
 Lab Results  Component Value Date   HGBA1C 12.4 (H) 09/01/2023   uncontrolled, pt to continue current medical treatment lantus  20 bid, glimeparide 2 mg , novolog  15 u tid, but also add mounjaro 2.5 mg with intent to titrate, and urge compliance with meds

## 2023-09-01 NOTE — Assessment & Plan Note (Signed)
 Lab Results  Component Value Date   LDLCALC 81 09/01/2023   Uncontrolled, pt to conintu ecrestor 20 every day, declines other change for now

## 2023-09-01 NOTE — Progress Notes (Signed)
 Patient ID: Deborah Neal, female   DOB: 1951-04-02, 73 y.o.   MRN: [id number]         Chief Complaint:: wellness exam and dm, htn, hld, low vit d       HPI:  Deborah Neal is a 73 y.o. female here for wellness exam; for shingrix at pharmacy, due for colonoscopy, o/w up to date                        Also Pt denies chest pain, increased sob or doe, wheezing, orthopnea, PND, increased LE swelling, palpitations, dizziness or syncope.   Pt denies polydipsia, polyuria, or new focal neuro s/s.    Pt denies fever, wt loss, night sweats, loss of appetite, or other constitutional symptoms     Wt Readings from Last 3 Encounters:  09/01/23 178 lb (80.7 kg)  03/14/23 171 lb (77.6 kg)  03/04/23 171 lb (77.6 kg)   BP Readings from Last 3 Encounters:  09/01/23 124/82  03/04/23 122/64  01/27/23 (!) 93/59   Immunization History  Administered Date(s) Administered   Fluad Quad(high Dose 65+) 02/16/2019, 01/09/2020, 03/10/2021, 01/22/2022   Fluad Trivalent(High Dose 65+) 03/04/2023   Influenza Split 03/02/2011   Influenza Whole 01/29/2009   Influenza, High Dose Seasonal PF 02/22/2017   Influenza,inj,Quad PF,6+ Mos 05/30/2013, 05/15/2014, 07/14/2015   Influenza-Unspecified 05/27/2016, 11/24/2017   Pneumococcal Conjugate-13 12/13/2017   Pneumococcal Polysaccharide-23 02/22/2017   Td 01/29/2009   Tdap 07/04/2019, 01/11/2023   Zoster, Live 09/19/2012   Health Maintenance Due  Topic Date Due   Zoster Vaccines- Shingrix (1 of 2) 06/23/2001   Colonoscopy  12/03/2021      Past Medical History:  Diagnosis Date   Acute massive pulmonary embolism (HCC)    ALLERGIC RHINITIS 01/29/2007   Aortic atherosclerosis (HCC)    Arthritis    ASTHMA 01/29/2007   ASTHMA, WITH ACUTE EXACERBATION 06/24/2008   BACK PAIN 01/29/2009   Cardiac arrest [facility])    CHEST PAIN 06/24/2008   Coronary artery calcification seen on CT scan    Family history of adverse reaction to anesthesia    Patients daughter has N/V after  anesthesia   GERD (gastroesophageal reflux disease)    History of shingles    HYPERLIPIDEMIA 01/29/2007   HYPERTENSION 01/29/2007   OSTEOPENIA 10/16/2007   Type II or unspecified type diabetes mellitus without mention of complication, uncontrolled 11/29/2013   Past Surgical History:  Procedure Laterality Date   CHOLECYSTECTOMY N/A 09/19/2015   Procedure: LAPAROSCOPIC CHOLECYSTECTOMY WITH INTRAOPERATIVE CHOLANGIOGRAM;  Surgeon: Adalberto Hollow, MD;  Location: [facility] OR;  Service: General;  Laterality: N/A;   COLONOSCOPY     TONSILLECTOMY  1956   VIDEO BRONCHOSCOPY Bilateral 03/28/2014   Procedure: VIDEO BRONCHOSCOPY WITHOUT FLUORO;  Surgeon: Diamond Formica, MD;  Location: [facility] ENDOSCOPY;  Service: Cardiopulmonary;  Laterality: Bilateral;    reports that she quit smoking about 19 years ago. Her smoking use included cigarettes. She has been exposed to tobacco smoke. She has never used smokeless tobacco. She reports that she does not drink alcohol and does not use drugs. family history includes Asthma in her daughter and father; Cancer in her mother; Diabetes in her father; Heart disease in her father; Hypertension in her father. Allergies  Allergen Reactions   Shingrix [Zoster Vac Recomb Adjuvanted]     Allergy to original live zoster - rash   Current Outpatient Medications on File Prior to Visit  Medication Sig Dispense Refill   Accu-Chek  Softclix Lancets lancets Use as directed up to four times daily as directed 100 each 0   acetaminophen  (TYLENOL ) 325 MG tablet Take 1-2 tablets (325-650 mg total) by mouth every 4 (four) hours as needed for mild pain.     alendronate  (FOSAMAX ) 70 MG tablet Take 1 tablet (70 mg total) by mouth every 7 (seven) days. Take with a full glass of water  on an empty stomach. 12 tablet 3   amLODipine  (NORVASC ) 2.5 MG tablet Take 3 tablets (7.5 mg total) by mouth daily. 90 tablet 3   apixaban  (ELIQUIS ) 5 MG TABS tablet Take 1 tablet (5 mg total) by mouth 2 (two) times  daily. 60 tablet 4   blood glucose meter kit and supplies Use up to four times daily as directed. 1 each 0   fluconazole  (DIFLUCAN ) 150 MG tablet Take 1 tablet (150 mg total) by mouth daily. 1 tablet 0   furosemide  (LASIX ) 20 MG tablet Take 1 tablet (20 mg total) by mouth daily. 90 tablet 3   glimepiride  (AMARYL ) 2 MG tablet Take 1 tablet (2 mg total) by mouth daily before breakfast. 90 tablet 3   glucose blood test strip Use as directed up to four times daily as directed 400 each 3   insulin  aspart (NOVOLOG  FLEXPEN) 100 UNIT/ML FlexPen Inject 15 Units into the skin 3 (three) times daily with meals. 30 mL 4   insulin  glargine (LANTUS ) 100 UNIT/ML Solostar Pen Inject 20 Units into the skin 2 (two) times daily. 15 mL 11   Insulin  Pen Needle (BD PEN NEEDLE NANO U/F) 32G X 4 MM MISC Use to inject insulin  4 times daily. 100 each 0   levothyroxine  (SYNTHROID ) 75 MCG tablet Take 1 tablet (75 mcg total) by mouth daily at 6 (six) AM. 90 tablet 3   lidocaine  (LIDODERM ) 5 % Place 3 patches onto the skin daily. Remove & Discard patch within 12 hours or as directed by MD 30 patch 0   losartan  (COZAAR ) 25 MG tablet Take 1 tablet (25 mg total) by mouth daily. 90 tablet 3   magnesium  oxide (MAG-OX) 400 MG tablet Take 1 tablet (400 mg total) by mouth daily. 90 tablet 3   Multiple Vitamin (MULTIVITAMIN WITH MINERALS) TABS tablet Take 1 tablet by mouth daily.     oxyCODONE -acetaminophen  (PERCOCET/ROXICET) 5-325 MG tablet Take 1 tablet by mouth every 4 (four) hours as needed for severe pain. 15 tablet 0   potassium chloride  SA (KLOR-CON  M) 20 MEQ tablet Take 1 tablet (20 mEq total) by mouth daily. 90 tablet 3   rosuvastatin  (CRESTOR ) 20 MG tablet Take 1 tablet (20 mg total) by mouth daily. 90 tablet 3   [DISCONTINUED] lisdexamfetamine (VYVANSE ) 70 MG capsule Take 1 capsule (70 mg total) by mouth daily. 30 capsule 0   No current facility-administered medications on file prior to visit.        ROS:  All others  reviewed and negative.  Objective        PE:  BP 124/82 (BP Location: Left Arm, Patient Position: Sitting, Cuff Size: Normal)   Pulse 86   Temp 98.4 F (36.9 C) (Oral)   Ht 5\' 2"  (1.575 m)   Wt 178 lb (80.7 kg)   SpO2 96%   BMI 32.56 kg/m                 Constitutional: Pt appears in NAD               HENT: Head: NCAT.  Right Ear: External ear normal.                 Left Ear: External ear normal.                Eyes: . Pupils are equal, round, and reactive to light. Conjunctivae and EOM are normal               Nose: without d/c or deformity               Neck: Neck supple. Gross normal ROM               Cardiovascular: Normal rate and regular rhythm.                 Pulmonary/Chest: Effort normal and breath sounds without rales or wheezing.                Abd:  Soft, NT, ND, + BS, no organomegaly               Neurological: Pt is alert. At baseline orientation, motor grossly intact               Skin: Skin is warm. No rashes, no other new lesions, LE edema - none               Psychiatric: Pt behavior is normal without agitation   Micro: none  Cardiac tracings I have personally interpreted today:  none  Pertinent Radiological findings (summarize): none   Lab Results  Component Value Date   WBC 6.3 09/01/2023   HGB 13.1 09/01/2023   HCT 42.1 09/01/2023   PLT 207.0 09/01/2023   GLUCOSE 266 (H) 09/01/2023   CHOL 150 09/01/2023   TRIG 115.0 09/01/2023   HDL 46.20 09/01/2023   LDLDIRECT 153.4 01/29/2009   LDLCALC 81 09/01/2023   ALT 19 09/01/2023   AST 18 09/01/2023   NA 138 09/01/2023   K 3.7 09/01/2023   CL 100 09/01/2023   CREATININE 1.01 09/01/2023   BUN 24 (H) 09/01/2023   CO2 29 09/01/2023   TSH 2.28 09/01/2023   INR 1.1 01/19/2023   HGBA1C 12.4 (H) 09/01/2023   MICROALBUR 2.5 (H) 09/01/2023   Assessment/Plan:  DAUNE SOBELMAN is a 73 y.o. Black or African American [2] female with  has a past medical history of Acute massive pulmonary embolism  (HCC), ALLERGIC RHINITIS (01/29/2007), Aortic atherosclerosis (HCC), Arthritis, ASTHMA (01/29/2007), ASTHMA, WITH ACUTE EXACERBATION (06/24/2008), BACK PAIN (01/29/2009), Cardiac arrest [facility]), CHEST PAIN (06/24/2008), Coronary artery calcification seen on CT scan, Family history of adverse reaction to anesthesia, GERD (gastroesophageal reflux disease), History of shingles, HYPERLIPIDEMIA (01/29/2007), HYPERTENSION (01/29/2007), OSTEOPENIA (10/16/2007), and Type II or unspecified type diabetes mellitus without mention of complication, uncontrolled (11/29/2013).  Encounter for well adult exam with abnormal findings Age and sex appropriate education and counseling updated with regular exercise and diet Referrals for preventative services - for colonoscopy Immunizations addressed - for shingrix at pharmacy Smoking counseling  - none needed Evidence for depression or other mood disorder - none significant Most recent labs reviewed. I have personally reviewed and have noted: 1) the patient's medical and social history 2) The patient's current medications and supplements 3) The patient's height, weight, and BMI have been recorded in the chart   Essential hypertension BP Readings from Last 3 Encounters:  09/01/23 124/82  03/04/23 122/64  01/27/23 (!) 93/59   Stable, pt to continue medical treatment norvasc  2.5 every day, losartan  25 qd  Diabetes mellitus type II, non insulin  dependent (HCC) Lab Results  Component Value Date   HGBA1C 12.4 (H) 09/01/2023   uncontrolled, pt to continue current medical treatment lantus  20 bid, glimeparide 2 mg , novolog  15 u tid, but also add mounjaro 2.5 mg with intent to titrate, and urge compliance with meds   HLD (hyperlipidemia) Lab Results  Component Value Date   LDLCALC 81 09/01/2023   Uncontrolled, pt to conintu ecrestor 20 every day, declines other change for now   Vitamin D  deficiency Last vitamin D  Lab Results  Component Value Date    VD25OH 39.93 09/01/2023   Low, to start oral replacement  Followup: Return in about 6 months (around 03/03/2024).  Rosalia Colonel, MD 09/01/2023 8:41 PM [facility]

## 2023-09-01 NOTE — Assessment & Plan Note (Signed)
 BP Readings from Last 3 Encounters:  09/01/23 124/82  03/04/23 122/64  01/27/23 (!) 93/59   Stable, pt to continue medical treatment norvasc  2.5 every day, losartan  25 qd

## 2023-09-01 NOTE — Patient Instructions (Addendum)
 Please have your Shingrix (shingles) shots done at your local pharmacy.  Please take all new medication as prescribed - the mounjaro 2.5 mg shot once per wk  Please let us  know in 1 month if you are doing ok with this, so we can increase the dose  Please continue all other medications as before, and refills have been done if requested.  Please have the pharmacy call with any other refills you may need.  Please continue your efforts at being more active, low cholesterol diet, and weight control.  You are otherwise up to date with prevention measures today.  Please keep your appointments with your specialists as you may have planned  You will be contacted regarding the referral for: colonoscopy  Please go to the LAB at the blood drawing area for the tests to be done  You will be contacted by phone if any changes need to be made immediately.  Otherwise, you will receive a letter about your results with an explanation, but please check with MyChart first.  Please make an Appointment to return in 6 months, or sooner if needed

## 2023-09-05 ENCOUNTER — Other Ambulatory Visit (HOSPITAL_COMMUNITY): Payer: Self-pay

## 2023-09-08 ENCOUNTER — Ambulatory Visit: Payer: Self-pay

## 2023-09-27 ENCOUNTER — Other Ambulatory Visit (HOSPITAL_COMMUNITY): Payer: Self-pay

## 2023-09-27 ENCOUNTER — Other Ambulatory Visit: Payer: Self-pay

## 2023-09-27 ENCOUNTER — Other Ambulatory Visit: Payer: Self-pay | Admitting: Internal Medicine

## 2023-09-27 MED ORDER — AMLODIPINE BESYLATE 2.5 MG PO TABS
7.5000 mg | ORAL_TABLET | Freq: Every day | ORAL | 3 refills | Status: DC
  Filled 2023-09-27: qty 90, 30d supply, fill #0
  Filled 2023-10-26: qty 90, 30d supply, fill #1
  Filled 2023-11-24: qty 90, 30d supply, fill #2
  Filled 2023-12-19: qty 90, 30d supply, fill #3

## 2023-10-04 ENCOUNTER — Other Ambulatory Visit (HOSPITAL_COMMUNITY): Payer: Self-pay

## 2023-10-20 ENCOUNTER — Other Ambulatory Visit (HOSPITAL_COMMUNITY): Payer: Self-pay

## 2023-11-03 ENCOUNTER — Encounter: Payer: Self-pay | Admitting: Internal Medicine

## 2023-11-07 ENCOUNTER — Other Ambulatory Visit: Payer: Self-pay | Admitting: Endocrinology

## 2023-11-07 ENCOUNTER — Telehealth: Payer: Self-pay

## 2023-11-07 ENCOUNTER — Other Ambulatory Visit (HOSPITAL_COMMUNITY): Payer: Self-pay

## 2023-11-07 MED ORDER — NOVOLOG FLEXPEN 100 UNIT/ML ~~LOC~~ SOPN
15.0000 [IU] | PEN_INJECTOR | Freq: Three times a day (TID) | SUBCUTANEOUS | 0 refills | Status: DC
  Filled 2023-11-07: qty 30, 66d supply, fill #0

## 2023-11-07 NOTE — Telephone Encounter (Signed)
 Contact patient for follow up appointment

## 2023-11-10 ENCOUNTER — Ambulatory Visit: Payer: Medicare PPO

## 2023-11-10 VITALS — Ht 62.0 in | Wt 178.0 lb

## 2023-11-10 DIAGNOSIS — Z1231 Encounter for screening mammogram for malignant neoplasm of breast: Secondary | ICD-10-CM

## 2023-11-10 DIAGNOSIS — Z1211 Encounter for screening for malignant neoplasm of colon: Secondary | ICD-10-CM

## 2023-11-10 DIAGNOSIS — Z0000 Encounter for general adult medical examination without abnormal findings: Secondary | ICD-10-CM | POA: Diagnosis not present

## 2023-11-10 DIAGNOSIS — Z5982 Transportation insecurity: Secondary | ICD-10-CM | POA: Diagnosis not present

## 2023-11-10 DIAGNOSIS — Z1212 Encounter for screening for malignant neoplasm of rectum: Secondary | ICD-10-CM

## 2023-11-10 NOTE — Progress Notes (Signed)
 Subjective:   Deborah Neal is a 73 y.o. who presents for a Medicare Wellness preventive visit.  As a reminder, Annual Wellness Visits don't include a physical exam, and some assessments may be limited, especially if this visit is performed virtually. We may recommend an in-person follow-up visit with your provider if needed.  Visit Complete: Virtual I connected with  Deborah Neal on 11/10/23 by a audio enabled telemedicine application and verified that I am speaking with the correct person using two identifiers.  Patient Location: Home  Provider Location: Office/Clinic  I discussed the limitations of evaluation and management by telemedicine. The patient expressed understanding and agreed to proceed.  Vital Signs: Because this visit was a virtual/telehealth visit, some criteria may be missing or patient reported. Any vitals not documented were not able to be obtained and vitals that have been documented are patient reported.  VideoDeclined- This patient declined Librarian, academic. Therefore the visit was completed with audio only.  Persons Participating in Visit: Patient.  AWV Questionnaire: No: Patient Medicare AWV questionnaire was not completed prior to this visit.  Cardiac Risk Factors include: advanced age (>26men, >83 women);hypertension;diabetes mellitus;Other (see comment), Risk factor comments: AKI     Objective:    Today's Vitals   11/10/23 1435  Weight: 178 lb (80.7 kg)  Height: 5' 2 (1.575 m)   Body mass index is 32.56 kg/m.     11/10/2023    2:41 PM 01/19/2023    7:05 PM 11/09/2022    2:36 PM 08/25/2022    9:08 PM 04/25/2022    4:50 PM 04/22/2022   11:00 AM 04/23/2021    7:56 PM  Advanced Directives  Does Patient Have a Medical Advance Directive? Yes No  Yes No No No  Type of Advance Directive Healthcare Power of Attorney  Living will Healthcare Power of Attorney     Does patient want to make changes to medical advance directive?     No - Patient declined     Copy of Healthcare Power of Attorney in Chart? No - copy requested   No - copy requested     Would patient like information on creating a medical advance directive?  No - Patient declined  No - Patient declined No - Patient declined Yes (Inpatient - patient defers creating a medical advance directive at this time - Information given) No - Patient declined    Current Medications (verified) Outpatient Encounter Medications as of 11/10/2023  Medication Sig   Accu-Chek Softclix Lancets lancets Use as directed up to four times daily as directed   acetaminophen  (TYLENOL ) 325 MG tablet Take 1-2 tablets (325-650 mg total) by mouth every 4 (four) hours as needed for mild pain.   alendronate  (FOSAMAX ) 70 MG tablet Take 1 tablet (70 mg total) by mouth every 7 (seven) days. Take with a full glass of water  on an empty stomach.   amLODipine  (NORVASC ) 2.5 MG tablet Take 3 tablets (7.5 mg total) by mouth daily.   apixaban  (ELIQUIS ) 5 MG TABS tablet Take 1 tablet (5 mg total) by mouth 2 (two) times daily.   blood glucose meter kit and supplies Use up to four times daily as directed.   fluconazole  (DIFLUCAN ) 150 MG tablet Take 1 tablet (150 mg total) by mouth daily.   furosemide  (LASIX ) 20 MG tablet Take 1 tablet (20 mg total) by mouth daily.   glimepiride  (AMARYL ) 2 MG tablet Take 1 tablet (2 mg total) by mouth daily before breakfast.  glucose blood test strip Use as directed up to four times daily as directed   insulin  aspart (NOVOLOG  FLEXPEN) 100 UNIT/ML FlexPen Inject 15 Units into the skin 3 (three) times daily with meals.   insulin  glargine (LANTUS ) 100 UNIT/ML Solostar Pen Inject 20 Units into the skin 2 (two) times daily.   Insulin  Pen Needle (BD PEN NEEDLE NANO U/F) 32G X 4 MM MISC Use to inject insulin  4 times daily.   levothyroxine  (SYNTHROID ) 75 MCG tablet Take 1 tablet (75 mcg total) by mouth daily at 6 (six) AM.   lidocaine  (LIDODERM ) 5 % Place 3 patches onto the skin  daily. Remove & Discard patch within 12 hours or as directed by MD   losartan  (COZAAR ) 25 MG tablet Take 1 tablet (25 mg total) by mouth daily.   magnesium  oxide (MAG-OX) 400 MG tablet Take 1 tablet (400 mg total) by mouth daily.   Multiple Vitamin (MULTIVITAMIN WITH MINERALS) TABS tablet Take 1 tablet by mouth daily.   oxyCODONE -acetaminophen  (PERCOCET/ROXICET) 5-325 MG tablet Take 1 tablet by mouth every 4 (four) hours as needed for severe pain.   potassium chloride  SA (KLOR-CON  M) 20 MEQ tablet Take 1 tablet (20 mEq total) by mouth daily.   rosuvastatin  (CRESTOR ) 20 MG tablet Take 1 tablet (20 mg total) by mouth daily.   tirzepatide  (MOUNJARO ) 2.5 MG/0.5ML Pen Inject 2.5 mg into the skin once a week.   [DISCONTINUED] lisdexamfetamine (VYVANSE ) 70 MG capsule Take 1 capsule (70 mg total) by mouth daily.   No facility-administered encounter medications on file as of 11/10/2023.    Allergies (verified) Shingrix [zoster vac recomb adjuvanted]   History: Past Medical History:  Diagnosis Date   Acute massive pulmonary embolism (HCC)    ALLERGIC RHINITIS 01/29/2007   Aortic atherosclerosis (HCC)    Arthritis    ASTHMA 01/29/2007   ASTHMA, WITH ACUTE EXACERBATION 06/24/2008   BACK PAIN 01/29/2009   Cardiac arrest [facility])    CHEST PAIN 06/24/2008   Coronary artery calcification seen on CT scan    Family history of adverse reaction to anesthesia    Patients daughter has N/V after anesthesia   GERD (gastroesophageal reflux disease)    History of shingles    HYPERLIPIDEMIA 01/29/2007   HYPERTENSION 01/29/2007   OSTEOPENIA 10/16/2007   Type II or unspecified type diabetes mellitus without mention of complication, uncontrolled 11/29/2013   Past Surgical History:  Procedure Laterality Date   CHOLECYSTECTOMY N/A 09/19/2015   Procedure: LAPAROSCOPIC CHOLECYSTECTOMY WITH INTRAOPERATIVE CHOLANGIOGRAM;  Surgeon: Krystal Russell, MD;  Location: [facility] OR;  Service: General;  Laterality: N/A;    COLONOSCOPY     TONSILLECTOMY  1956   VIDEO BRONCHOSCOPY Bilateral 03/28/2014   Procedure: VIDEO BRONCHOSCOPY WITHOUT FLUORO;  Surgeon: Ozell KATHEE America, MD;  Location: [facility];  Service: Cardiopulmonary;  Laterality: Bilateral;   Family History  Problem Relation Age of Onset   Cancer Mother        ? type    Diabetes Father    Hypertension Father    Heart disease Father    Asthma Father    Asthma Daughter    Colon cancer Neg Hx    Esophageal cancer Neg Hx    Rectal cancer Neg Hx    Stomach cancer Neg Hx    Breast cancer Neg Hx    Social History   Socioeconomic History   Marital status: Single    Spouse name: Not on file   Number of children: 4   Years of  education: Not on file   Highest education level: Not on file  Occupational History   Occupation: RETIRED  Tobacco Use   Smoking status: Former    Current packs/day: 0.00    Types: Cigarettes    Quit date: 09/07/2003    Years since quitting: 20.1    Passive exposure: Past   Smokeless tobacco: Never  Vaping Use   Vaping status: Never Used  Substance and Sexual Activity   Alcohol use: No   Drug use: No   Sexual activity: Never  Other Topics Concern   Not on file  Social History Narrative   Lives alone/2025   Social Drivers of Health   Financial Resource Strain: Medium Risk (11/10/2023)   Overall Financial Resource Strain (CARDIA)    Difficulty of Paying Living Expenses: Somewhat hard  Food Insecurity: No Food Insecurity (11/10/2023)   Hunger Vital Sign    Worried About Running Out of Food in the Last Year: Never true    Ran Out of Food in the Last Year: Never true  Transportation Needs: Unmet Transportation Needs (11/10/2023)   PRAPARE - Transportation    Lack of Transportation (Medical): Yes    Lack of Transportation (Non-Medical): Yes  Physical Activity: Inactive (11/10/2023)   Exercise Vital Sign    Days of Exercise per Week: 0 days    Minutes of Exercise per Session: 0 min  Stress: No Stress Concern  Present (11/10/2023)   [employer] of Occupational Health - Occupational Stress Questionnaire    Feeling of Stress: Not at all  Social Connections: Moderately Integrated (11/10/2023)   Social Connection and Isolation Panel    Frequency of Communication with Friends and Family: More than three times a week    Frequency of Social Gatherings with Friends and Family: More than three times a week    Attends Religious Services: More than 4 times per year    Active Member of [employer] or Organizations: Yes    Attends Banker Meetings: Never    Marital Status: Never married    Tobacco Counseling Counseling given: Not Answered    Clinical Intake:  Pre-visit preparation completed: Yes  Pain : No/denies pain     BMI - recorded: 32.56 Nutritional Status: BMI > 30  Obese Nutritional Risks: None Diabetes: Yes CBG done?: Yes (168-per pt) CBG resulted in Enter/ Edit results?: No Did pt. bring in CBG monitor from home?: No  Lab Results  Component Value Date   HGBA1C 12.4 (H) 09/01/2023   HGBA1C 15.0 (>) 12/21/2022   HGBA1C >14.0 (H) 09/03/2022     How often do you need to have someone help you when you read instructions, pamphlets, or other written materials from your doctor or pharmacy?: 1 - Never  Interpreter Needed?: No  Information entered by :: Shane Melby, RMA   Activities of Daily Living     11/10/2023    2:35 PM  In your present state of health, do you have any difficulty performing the following activities:  Hearing? 0  Vision? 0  Difficulty concentrating or making decisions? 0  Walking or climbing stairs? 0  Dressing or bathing? 0  Doing errands, shopping? 1  Preparing Food and eating ? N  Using the Toilet? N  In the past six months, have you accidently leaked urine? N  Do you have problems with loss of bowel control? N  Managing your Medications? N  Managing your Finances? N  Housekeeping or managing your Housekeeping? N    Patient Care  Team: Norleen Lynwood ORN, MD as PCP - General Verlin Lonni BIRCH, MD as PCP - Cardiology (Cardiology)  I have updated your Care Teams any recent Medical Services you may have received from other providers in the past year.     Assessment:   This is a routine wellness examination for [place].  Hearing/Vision screen Hearing Screening - Comments:: Denies hearing difficulties   Vision Screening - Comments:: Wears eyeglasses/ [facility]/ looking for a new eye doctor   Goals Addressed               This Visit's Progress     Patient Stated (pt-stated)        Do better.       Depression Screen     11/10/2023    2:46 PM 09/01/2023    2:37 PM 03/04/2023   10:35 AM 11/09/2022    2:38 PM 09/03/2022    1:07 PM 05/05/2022    3:59 PM 01/22/2022   11:00 AM  PHQ 2/9 Scores  PHQ - 2 Score 0 0 1 0 0 0 0  PHQ- 9 Score 0   0       Fall Risk     11/10/2023    2:42 PM 09/01/2023    2:46 PM 03/04/2023   10:35 AM 11/09/2022    2:38 PM 09/03/2022    1:07 PM  Fall Risk   Falls in the past year? 0 0 1 0 0  Number falls in past yr: 0 0 0 0 0  Injury with Fall? 0 0 1 0 0  Risk for fall due to : No Fall Risks No Fall Risks History of fall(s) No Fall Risks No Fall Risks  Follow up Falls evaluation completed Falls evaluation completed Falls evaluation completed Falls prevention discussed Falls evaluation completed    MEDICARE RISK AT HOME:  Medicare Risk at Home Any stairs in or around the home?: Yes (front porch) If so, are there any without handrails?: Yes Home free of loose throw rugs in walkways, pet beds, electrical cords, etc?: Yes Adequate lighting in your home to reduce risk of falls?: Yes Life alert?: No Use of a cane, walker or w/c?: No Grab bars in the bathroom?: No Shower chair or bench in shower?: Yes Elevated toilet seat or a handicapped toilet?: Yes  TIMED UP AND GO:  Was the test performed?  No  Cognitive Function: Declined/Normal: No cognitive concerns noted by  patient or family. Patient alert, oriented, able to answer questions appropriately and recall recent events. No signs of memory loss or confusion.        11/09/2022    2:53 PM  6CIT Screen  What Year? 0 points  What month? 0 points  What time? 0 points  Count back from 20 0 points  Months in reverse 0 points  Repeat phrase 0 points  Total Score 0 points    Immunizations Immunization History  Administered Date(s) Administered   Fluad Quad(high Dose 65+) 02/16/2019, 01/09/2020, 03/10/2021, 01/22/2022   Fluad Trivalent(High Dose 65+) 03/04/2023   Influenza Split 03/02/2011   Influenza Whole 01/29/2009   Influenza, High Dose Seasonal PF 02/22/2017   Influenza,inj,Quad PF,6+ Mos 05/30/2013, 05/15/2014, 07/14/2015   Influenza-Unspecified 05/27/2016, 11/24/2017   Pneumococcal Conjugate-13 12/13/2017   Pneumococcal Polysaccharide-23 02/22/2017   Td 01/29/2009   Tdap 07/04/2019, 01/11/2023   Zoster, Live 09/19/2012    Screening Tests Health Maintenance  Topic Date Due   Zoster Vaccines- Shingrix (1 of 2) 06/23/2001  Colonoscopy  12/03/2021   OPHTHALMOLOGY EXAM  11/10/2023   INFLUENZA VACCINE  11/25/2023   HEMOGLOBIN A1C  03/03/2024   Diabetic kidney evaluation - eGFR measurement  08/31/2024   Diabetic kidney evaluation - Urine ACR  08/31/2024   FOOT EXAM  08/31/2024   MAMMOGRAM  11/09/2024   Medicare Annual Wellness (AWV)  11/09/2024   DTaP/Tdap/Td (4 - Td or Tdap) 01/10/2033   Pneumococcal Vaccine: 50+ Years  Completed   DEXA SCAN  Completed   Hepatitis C Screening  Completed   Hepatitis B Vaccines  Aged Out   HPV VACCINES  Aged Out   Meningococcal B Vaccine  Aged Out   COVID-19 Vaccine  Discontinued    Health Maintenance  Health Maintenance Due  Topic Date Due   Zoster Vaccines- Shingrix (1 of 2) 06/23/2001   Colonoscopy  12/03/2021   OPHTHALMOLOGY EXAM  11/10/2023   Health Maintenance Items Addressed: Mammogram ordered, Referral sent to GI for colonoscopy,  See Nurse Notes at the end of this note  Additional Screening:  Vision Screening: Recommended annual ophthalmology exams for early detection of glaucoma and other disorders of the eye. Would you like a referral to an eye doctor? Yes    Dental Screening: Recommended annual dental exams for proper oral hygiene  Community Resource Referral / Chronic Care Management: CRR required this visit?  No   CCM required this visit?  No   Plan:    I have personally reviewed and noted the following in the patient's chart:   Medical and social history Use of alcohol, tobacco or illicit drugs  Current medications and supplements including opioid prescriptions. Patient is not currently taking opioid prescriptions. Functional ability and status Nutritional status Physical activity Advanced directives List of other physicians Hospitalizations, surgeries, and ER visits in previous 12 months Vitals Screenings to include cognitive, depression, and falls Referrals and appointments  In addition, I have reviewed and discussed with patient certain preventive protocols, quality metrics, and best practice recommendations. A written personalized care plan for preventive services as well as general preventive health recommendations were provided to patient.   Valeta Paz L Maninder Deboer, CMA   11/10/2023   After Visit Summary: (Mail) Due to this being a telephonic visit, the after visit summary with patients personalized plan was offered to patient via mail   Notes: Patient is due for a mammogram and a colonoscopy and a diabetic eye exam.  She declines any shingrix vaccines.  I have placed a VBCI referral for patient to get transportation also.

## 2023-11-10 NOTE — Patient Instructions (Addendum)
 Deborah Neal , Thank you for taking time out of your busy schedule to complete your Annual Wellness Visit with me. I enjoyed our conversation and look forward to speaking with you again next year. I, as well as your care team,  appreciate your ongoing commitment to your health goals. Please review the following plan we discussed and let me know if I can assist you in the future. Your Game plan/ To Do List    Referrals: If you haven't heard from the office you've been referred to, please reach out to them at the phone provided.  [facility], [facility] [address],  Address: [address], [place], [place] [address] Phone: [phone]  You have an order for:  [x]   3D Mammogram   Please call for appointment:  [facility] [address] [place], [place] [address] [phone]  Make sure to wear two-piece clothing.  No lotions, powders, or deodorants the day of the appointment. Make sure to bring picture ID and insurance card.  Bring list of medications you are currently taking including any supplements.   Follow up Visits: Next Medicare AWV with our clinical staff: Patient prefers a telephone visit.  11/13/2024.   Have you seen your provider in the last 6 months (3 months if uncontrolled diabetes)? Yes Next Office Visit with your provider: 03/05/2024.  Clinician Recommendations:  Aim for 30 minutes of exercise or brisk walking, 6-8 glasses of water , and 5 servings of fruits and vegetables each day. You are due for a diabetic eye exam.  Please call to get scheduled to have that done.  Remember to call to schedule your colonoscopy and mammogram at the numbers above.  Also, remember to listen out for someone to call you about setting up transportation.        This is a list of the screening recommended for you and due dates:  Health Maintenance  Topic Date Due   Zoster (Shingles) Vaccine (1 of 2) 06/23/2001   Colon  Cancer Screening  12/03/2021   Eye exam for diabetics  11/10/2023   Flu Shot  11/25/2023   Hemoglobin A1C  03/03/2024   Yearly kidney function blood test for diabetes  08/31/2024   Yearly kidney health urinalysis for diabetes  08/31/2024   Complete foot exam   08/31/2024   Mammogram  11/09/2024   Medicare Annual Wellness Visit  11/09/2024   DTaP/Tdap/Td vaccine (4 - Td or Tdap) 01/10/2033   Pneumococcal Vaccine for age over 32  Completed   DEXA scan (bone density measurement)  Completed   Hepatitis C Screening  Completed   Hepatitis B Vaccine  Aged Out   HPV Vaccine  Aged Out   Meningitis B Vaccine  Aged Out   COVID-19 Vaccine  Discontinued    Advanced directives: (Copy Requested) Please bring a copy of your health care power of attorney and living will to the office to be added to your chart at your convenience. You can mail to [facility] [address] [place], [place] [address] or email to [email or web address] Advance Care Planning is important because it:  [x]  Makes sure you receive the medical care that is consistent with your values, goals, and preferences  [x]  It provides guidance to your family and loved ones and reduces their decisional burden about whether or not they are making the right decisions based on your wishes.  Follow the link provided in your  after visit summary or read over the paperwork we have mailed to you to help you started getting your Advance Directives in place. If you need assistance in completing these, please reach out to us  so that we can help you!  See attachments for Preventive Care and Fall Prevention Tips.

## 2023-11-11 DIAGNOSIS — E119 Type 2 diabetes mellitus without complications: Secondary | ICD-10-CM | POA: Diagnosis not present

## 2023-11-11 DIAGNOSIS — I10 Essential (primary) hypertension: Secondary | ICD-10-CM | POA: Diagnosis not present

## 2023-11-11 DIAGNOSIS — E7801 Familial hypercholesterolemia: Secondary | ICD-10-CM | POA: Diagnosis not present

## 2023-11-11 DIAGNOSIS — H524 Presbyopia: Secondary | ICD-10-CM | POA: Diagnosis not present

## 2023-11-11 DIAGNOSIS — H5203 Hypermetropia, bilateral: Secondary | ICD-10-CM | POA: Diagnosis not present

## 2023-11-14 ENCOUNTER — Other Ambulatory Visit (HOSPITAL_COMMUNITY): Payer: Self-pay

## 2023-12-05 ENCOUNTER — Other Ambulatory Visit (HOSPITAL_COMMUNITY): Payer: Self-pay

## 2023-12-06 ENCOUNTER — Telehealth: Payer: Self-pay | Admitting: *Deleted

## 2023-12-06 NOTE — Progress Notes (Signed)
 Complex Care Management Note  Care Guide Note 12/06/2023 Name: [name] MRN: [id number] DOB: [birth year]  Deborah Neal is a 73 y.o. year old female who sees Norleen Lynwood ORN, MD for primary care. I reached out to [name] by phone today to offer complex care management services.  Deborah Neal was given information about Complex Care Management services today including:   The Complex Care Management services include support from the care team which includes your Nurse Care Manager, Clinical Social Worker, or Pharmacist.  The Complex Care Management team is here to help remove barriers to the health concerns and goals most important to you. Complex Care Management services are voluntary, and the patient may decline or stop services at any time by request to their care team member.   Complex Care Management Consent Status: Patient agreed to services and verbal consent obtained.   Follow up plan:  Telephone appointment with complex care management team member scheduled for:  8/13  Encounter Outcome:  Patient Scheduled  Deborah Neal  [facility]  Deborah Neal, Deborah Neal Guide  Direct Dial: [phone]  Fax [phone]

## 2023-12-07 ENCOUNTER — Other Ambulatory Visit: Payer: Self-pay | Admitting: *Deleted

## 2023-12-07 DIAGNOSIS — E119 Type 2 diabetes mellitus without complications: Secondary | ICD-10-CM

## 2023-12-07 DIAGNOSIS — Z794 Long term (current) use of insulin: Secondary | ICD-10-CM

## 2023-12-07 NOTE — Patient Instructions (Signed)
 Visit Information  Thank you for taking time to visit with me today. Please don't hesitate to contact me if I can be of assistance to you before our next scheduled appointment.  Our next appointment is by telephone on 01/10/2024 at 2:00 pm Please call the care guide team at [phone] if you need to cancel or reschedule your appointment.   Following is a copy of your care plan:   Goals Addressed             This Visit's Progress    VBCI RN Care Plan-DM       Problems:  Chronic Disease Management support and education needs related to DMII Knowledge Deficits related to DMII Transportation barriers  Goal: Over the next 90 days the Patient will continue to work with Medical illustrator and/or Social Worker to address care management and care coordination needs related to DMII as evidenced by adherence to care management team scheduled appointments     take all medications exactly as prescribed and will call provider for medication related questions as evidenced by chart review and patient report    work with Child psychotherapist to address Transportation related to the management of DMII as evidenced by review of electronic medical record and patient or social worker report      Interventions:   Diabetes Interventions: Assessed patient's understanding of A1c goal: <8% Provided education to patient about basic DM disease process Counseled on importance of regular laboratory monitoring as prescribed Discussed plans with patient for ongoing care management follow up and provided patient with direct contact information for care management team Provided patient with written educational materials related to hypo and hyperglycemia and importance of correct treatment Reviewed scheduled/upcoming provider appointments including: 01/03/2024 10:00 am Endocrinologist and PCP 03/05/2024 2:40 pm Referral made to social work team for assistance with Transportation (SDOH) Review of patient status, including  review of consultants reports, relevant laboratory and other test results, and medications completed Screening for signs and symptoms of depression related to chronic disease state  Assessed social determinant of health barriers Lab Results  Component Value Date   HGBA1C 12.4 (H) 09/01/2023    Patient Self-Care Activities:  Attend all scheduled provider appointments Attend church or other social activities Call pharmacy for medication refills 3-7 days in advance of running out of medications Call provider office for new concerns or questions  Perform all self care activities independently  Perform IADL's (shopping, preparing meals, housekeeping, managing finances) independently Take medications as prescribed   Work with the social worker to address care coordination needs and will continue to work with the clinical team to address health care and disease management related needs check blood sugar at prescribed times: before meals and at bedtime, twice daily, and when you have symptoms of low or high blood sugar check feet daily for cuts, sores or redness enter blood sugar readings and medication or insulin  into daily log take the blood sugar log to all doctor visits trim toenails straight across drink 6 to 8 glasses of water  each day eat fish at least once per week fill half of plate with vegetables limit fast food meals to no more than 1 per week manage portion size prepare main meal at home 3 to 5 days each week set a realistic goal switch to low-fat or skim milk switch to sugar-free drinks do heel pump exercise 2 to 3 times each day keep feet up while sitting wash and dry feet carefully every day wear comfortable, cotton socks  Plan:  Telephone follow up appointment with care management team member scheduled for:  01/10/2024 @ 2 pm             Please call the Suicide and Crisis Lifeline: 988 call the [place]  National Suicide Prevention Lifeline: 8074198096 or TTY:  978-183-5719 TTY 216-457-5980) to talk to a trained counselor call 1-800-273-TALK (toll free, 24 hour hotline) if you are experiencing a Mental Health or Behavioral Health Crisis or need someone to talk to.  The patient verbalized understanding of instructions, educational materials, and care plan provided today and DECLINED offer to receive copy of patient instructions, educational materials, and care plan.   Olam Ku, RN, BSN [place]  [facility], [facility] RN Care Manager Direct Dial: [phone]  Fax: [phone]

## 2023-12-07 NOTE — Patient Outreach (Signed)
 Complex Care Management   Visit Note  12/07/2023  Name:  Deborah Neal MRN: [id number] DOB: May 07, 1950  Situation: Referral received for Complex Care Management related to Diabetes with Complications I obtained verbal consent from Patient.  Visit completed with patient  on the phone  Background:   Past Medical History:  Diagnosis Date   Acute massive pulmonary embolism (HCC)    ALLERGIC RHINITIS 01/29/2007   Aortic atherosclerosis (HCC)    Arthritis    ASTHMA 01/29/2007   ASTHMA, WITH ACUTE EXACERBATION 06/24/2008   BACK PAIN 01/29/2009   Cardiac arrest [facility])    CHEST PAIN 06/24/2008   Coronary artery calcification seen on CT scan    Family history of adverse reaction to anesthesia    Patients daughter has N/V after anesthesia   GERD (gastroesophageal reflux disease)    History of shingles    HYPERLIPIDEMIA 01/29/2007   HYPERTENSION 01/29/2007   OSTEOPENIA 10/16/2007   Type II or unspecified type diabetes mellitus without mention of complication, uncontrolled 11/29/2013    Assessment: Patient Reported Symptoms:  Cognitive Cognitive Status: Alert and oriented to person, place, and time, Normal speech and language skills, Able to follow simple commands   Health Maintenance Behaviors: Annual physical exam, Exercise, Immunizations, Social activities Healing Pattern: Average Health Facilitated by: Stress management  Neurological Neurological Review of Symptoms: No symptoms reported    HEENT HEENT Symptoms Reported: No symptoms reported      Cardiovascular Cardiovascular Symptoms Reported: No symptoms reported    Respiratory Respiratory Symptoms Reported: Shortness of breath (with over exertion that is resolved with rest) Additional Respiratory Details: Patient has a inhaler but not sure the name indicating it's was from an hospital visit awilt back. Educated patient to inform her provider and only use as prescribe if the provide wants to continue this medication. She states  she rarely use this med Respiratory Management Strategies: Routine screening  Endocrine Endocrine Symptoms Reported: No symptoms reported Is patient diabetic?: Yes Is patient checking blood sugars at home?: Yes List most recent blood sugar readings, include date and time of day: 157 fasting 12/07/2023    Gastrointestinal Gastrointestinal Symptoms Reported: No symptoms reported Gastrointestinal Management Strategies: Medication therapy, Exercise    Genitourinary Genitourinary Symptoms Reported: No symptoms reported    Integumentary Integumentary Symptoms Reported: No symptoms reported (Dry skin using OTC ointment (A+D)) Skin Management Strategies: Routine screening, Medication therapy  Musculoskeletal Musculoskelatal Symptoms Reviewed: No symptoms reported   Falls in the past year?: No Number of falls in past year: 1 or less Was there an injury with Fall?: No Fall Risk Category Calculator: 0 Patient Fall Risk Level: Low Fall Risk Patient at Risk for Falls Due to: No Fall Risks  Psychosocial Psychosocial Symptoms Reported: No symptoms reported   Major Change/Loss/Stressor/Fears (CP): Denies Quality of Family Relationships: helpful, involved, supportive Do you feel physically threatened by others?: No      12/07/2023   11:51 AM  Depression screen PHQ 2/9  Decreased Interest 0  Down, Depressed, Hopeless 0  PHQ - 2 Score 0    There were no vitals filed for this visit.  Medications Reviewed Today     Reviewed by Alvia Olam BIRCH, RN (Registered Nurse) on 12/07/23 at 1124  Med List Status: <None>   Medication Order Taking? Sig Documenting Provider Last Dose Status Informant  Accu-Chek Softclix Lancets lancets [id number] Yes Use as directed up to four times daily as directed   Active   acetaminophen  (TYLENOL ) 325 MG tablet [id number] Yes  Take 1-2 tablets (325-650 mg total) by mouth every 4 (four) hours as needed for mild pain. Setzer, Nena PARAS, PA-C  Active   alendronate  (FOSAMAX )  70 MG tablet [id number]  Take 1 tablet (70 mg total) by mouth every 7 (seven) days. Take with a full glass of water  on an empty stomach.  Patient not taking: Reported on 12/07/2023   Norleen Lynwood ORN, MD  Active   amLODipine  (NORVASC ) 2.5 MG tablet [id number] Yes Take 3 tablets (7.5 mg total) by mouth daily. Norleen Lynwood ORN, MD  Active   apixaban  (ELIQUIS ) 5 MG TABS tablet [id number] Yes Take 1 tablet (5 mg total) by mouth 2 (two) times daily. Norleen Lynwood ORN, MD  Active   blood glucose meter kit and supplies [id number] Yes Use up to four times daily as directed. Setzer, Nena PARAS, PA-C  Active   fluconazole  (DIFLUCAN ) 150 MG tablet [id number]  Take 1 tablet (150 mg total) by mouth daily.  Patient not taking: Reported on 12/07/2023   Dean Clarity, MD  Active   furosemide  (LASIX ) 20 MG tablet [id number] Yes Take 1 tablet (20 mg total) by mouth daily. Norleen Lynwood ORN, MD  Active   glimepiride  (AMARYL ) 2 MG tablet [id number] Yes Take 1 tablet (2 mg total) by mouth daily before breakfast. Thapa, [place], MD  Active   glucose blood test strip [id number] Yes Use as directed up to four times daily as directed Norleen Lynwood ORN, MD  Active   insulin  aspart (NOVOLOG  FLEXPEN) 100 UNIT/ML FlexPen [id number] Yes Inject 15 Units into the skin 3 (three) times daily with meals. Thapa, [place], MD  Active   insulin  glargine (LANTUS ) 100 UNIT/ML Solostar Pen [id number] Yes Inject 20 Units into the skin 2 (two) times daily. Thapa, [place], MD  Active   Insulin  Pen Needle (BD PEN NEEDLE NANO U/F) 32G X 4 MM MISC [id number] Yes Use to inject insulin  4 times daily. Norleen Lynwood ORN, MD  Active   levothyroxine  (SYNTHROID ) 75 MCG tablet [id number] Yes Take 1 tablet (75 mcg total) by mouth daily at 6 (six) AM. Norleen Lynwood ORN, MD  Active   lidocaine  (LIDODERM ) 5 % [id number]  Place 3 patches onto the skin daily. Remove & Discard patch within 12 hours or as directed by MD  Patient not taking: Reported on 12/07/2023   Setzer, Sandra J, PA-C  Active      Discontinued 07/30/22 1730 (Entry Error)            Med Note>> Octavia Donald CROME, [facility]   07/30/2022  5:30 PM wrong pt    losartan  (COZAAR ) 25 MG tablet [id number] Yes Take 1 tablet (25 mg total) by mouth daily. Norleen Lynwood ORN, MD  Active   magnesium  oxide (MAG-OX) 400 MG tablet [id number] Yes Take 1 tablet (400 mg total) by mouth daily. Norleen Lynwood ORN, MD  Active   Multiple Vitamin (MULTIVITAMIN WITH MINERALS) TABS tablet [id number] Yes Take 1 tablet by mouth daily. Setzer, Nena PARAS, PA-C  Active   oxyCODONE -acetaminophen  (PERCOCET/ROXICET) 5-325 MG tablet [id number]  Take 1 tablet by mouth every 4 (four) hours as needed for severe pain.  Patient not taking: Reported on 12/07/2023   Tegeler, Lonni PARAS, MD  Active   potassium chloride  SA (KLOR-CON  M) 20 MEQ tablet [id number] Yes Take 1 tablet (20 mEq total) by mouth daily. Norleen Lynwood ORN, MD  Active   rosuvastatin  (CRESTOR ) 20 MG tablet [id number] Yes Take 1 tablet (20 mg total) by mouth daily. Norleen Lynwood ORN,  MD  Active   tirzepatide  (MOUNJARO ) 2.5 MG/0.5ML Pen [id number] Yes Inject 2.5 mg into the skin once a week. Norleen Lynwood ORN, MD  Active             Recommendation:   PCP Follow-up Continue Current Plan of Care  Follow Up Plan:   Telephone follow up appointment date/time:  01/10/2024 @ 2:00 pm   Olam Ku, RN, BSN [place]  [facility], [facility] RN Care Manager Direct Dial: [phone]  Fax: [phone]

## 2023-12-13 ENCOUNTER — Other Ambulatory Visit: Payer: Self-pay | Admitting: Endocrinology

## 2023-12-13 ENCOUNTER — Other Ambulatory Visit (HOSPITAL_COMMUNITY): Payer: Self-pay

## 2023-12-13 ENCOUNTER — Other Ambulatory Visit: Payer: Self-pay

## 2023-12-13 MED ORDER — GLIMEPIRIDE 2 MG PO TABS
2.0000 mg | ORAL_TABLET | Freq: Every day | ORAL | 3 refills | Status: AC
  Filled 2023-12-13: qty 90, 90d supply, fill #0
  Filled 2024-03-12: qty 90, 90d supply, fill #1

## 2023-12-21 ENCOUNTER — Telehealth: Payer: Self-pay

## 2023-12-21 NOTE — Progress Notes (Signed)
 Complex Care Management Note Care Guide Note  12/21/2023 Name: Deborah Neal MRN: [id number] DOB: 1950/10/31  Deborah Neal is a 73 y.o. year old female who is a primary care patient of Norleen, Lynwood ORN, MD . The community resource team was consulted for assistance with Transportation Needs   SDOH screenings and interventions completed:  Yes  Social Drivers of Health From This Encounter   Food Insecurity: No Food Insecurity (12/21/2023)   Hunger Vital Sign    Worried About Running Out of Food in the Last Year: Never true    Ran Out of Food in the Last Year: Never true  Housing: Low Risk  (12/21/2023)   Housing Stability Vital Sign    Unable to Pay for Housing in the Last Year: No    Number of Times Moved in the Last Year: 0    Homeless in the Last Year: No  Financial Resource Strain: Medium Risk (12/21/2023)   Overall Financial Resource Strain (CARDIA)    Difficulty of Paying Living Expenses: Somewhat hard  Transportation Needs: Unmet Transportation Needs (12/21/2023)   PRAPARE - Administrator, Civil Service (Medical): Yes    Lack of Transportation (Non-Medical): No  Utilities: Not At Risk (12/21/2023)   Utilities    Threatened with loss of utilities: No    SDOH Interventions Today    Flowsheet Row Most Recent Value  SDOH Interventions   Transportation Interventions Other (Comment)  [Completed Architectural technologist and faxed Part B to patient's PCP Norleen MICAEL Lynwood, MD to be completed and faxed to [facility] [place] [phone]     Care guide performed the following interventions: Patient provided with information about care guide support team and interviewed to confirm resource needs.  Follow Up Plan:  Care guide will follow up with patient by phone over the next 7 days.  Encounter Outcome:  Patient Visit Completed  Eulalie Speights [facility]  [place] [facility] Care Guide Direct Dial: [phone]  Fax: [phone] Website:  [email or web address]

## 2023-12-27 DIAGNOSIS — E119 Type 2 diabetes mellitus without complications: Secondary | ICD-10-CM | POA: Diagnosis not present

## 2023-12-27 LAB — HM DIABETES EYE EXAM

## 2023-12-28 ENCOUNTER — Telehealth: Payer: Self-pay

## 2023-12-28 LAB — LAB REPORT - SCANNED: A1c: 8.5

## 2023-12-28 NOTE — Progress Notes (Signed)
 Complex Care Management Note Care Guide Note  12/28/2023 Name: Deborah Neal MRN: [id number] DOB: 1950-12-05  [name] is a 73 y.o. year old female who is a primary care patient of Norleen, Lynwood ORN, MD . The community resource team was consulted for assistance with Transportation Needs   SDOH screenings and interventions completed:  Yes     SDOH Interventions Today    Flowsheet Row Most Recent Value  SDOH Interventions   Transportation Interventions Other (Comment)  [Called patient to inform her I received and email confirming receipt of her online application. She should be receiving a call in the next few days.]     Care guide performed the following interventions: Follow up call placed to the patient to discuss status of referral.  Follow Up Plan:  Care guide will follow up with patient by phone over the next 7 days.  Encounter Outcome:  Patient Visit Completed  Kylil Swopes [facility]  [facility] Guide Direct Dial: [phone]  Fax: [phone] Website: [email or web address]

## 2024-01-03 ENCOUNTER — Other Ambulatory Visit: Payer: Self-pay

## 2024-01-03 ENCOUNTER — Other Ambulatory Visit (HOSPITAL_COMMUNITY): Payer: Self-pay

## 2024-01-03 ENCOUNTER — Encounter: Payer: Self-pay | Admitting: Endocrinology

## 2024-01-03 ENCOUNTER — Ambulatory Visit: Payer: Self-pay | Admitting: Endocrinology

## 2024-01-03 ENCOUNTER — Ambulatory Visit (INDEPENDENT_AMBULATORY_CARE_PROVIDER_SITE_OTHER): Admitting: Endocrinology

## 2024-01-03 ENCOUNTER — Telehealth: Payer: Self-pay

## 2024-01-03 VITALS — BP 98/60 | HR 94 | Resp 16 | Ht 62.0 in | Wt 174.0 lb

## 2024-01-03 DIAGNOSIS — E1165 Type 2 diabetes mellitus with hyperglycemia: Secondary | ICD-10-CM | POA: Diagnosis not present

## 2024-01-03 DIAGNOSIS — Z794 Long term (current) use of insulin: Secondary | ICD-10-CM

## 2024-01-03 LAB — POCT GLYCOSYLATED HEMOGLOBIN (HGB A1C): Hemoglobin A1C: 7.7 % — AB (ref 4.0–5.6)

## 2024-01-03 MED ORDER — FREESTYLE LIBRE 3 READER DEVI
1.0000 | 0 refills | Status: AC
  Filled 2024-01-03: qty 1, 90d supply, fill #0

## 2024-01-03 MED ORDER — INSULIN GLARGINE 100 UNIT/ML SOLOSTAR PEN
22.0000 [IU] | PEN_INJECTOR | Freq: Two times a day (BID) | SUBCUTANEOUS | 4 refills | Status: AC
  Filled 2024-01-03 – 2024-01-17 (×4): qty 30, 68d supply, fill #0

## 2024-01-03 MED ORDER — FREESTYLE LIBRE 3 PLUS SENSOR MISC
1.0000 | 3 refills | Status: AC
Start: 2024-01-03 — End: ?
  Filled 2024-01-03: qty 6, 90d supply, fill #0
  Filled 2024-04-05: qty 6, 90d supply, fill #1

## 2024-01-03 NOTE — Progress Notes (Unsigned)
 Outpatient Endocrinology Note Deborah Aryon Nham, MD  01/03/24  Patient's Name: Deborah Neal    DOB: [birth year]    MRN: [id number]                                                    REASON OF VISIT: Type 2 diabetes mellitus  REFERRING PROVIDER: Norleen Lynwood ORN, MD  PCP: Norleen Lynwood ORN, MD  HISTORY OF PRESENT ILLNESS:   Deborah Neal is a 73 y.o. old female with past medical history listed below, is here for follow up of type 2 diabetes mellitus.   Pertinent Diabetes History: Patient was diagnosed with type 2 diabetes mellitus several years ago, she was initially treated with metformin  and insulin  therapy was added in 2018.  She had taken.  Types of insulin  in the past including premixed insulin .  She mostly had poor control of diabetes mellitus.  He was not on GLP-1 receptor agonist.  She was last seen by Dr. Von in this clinic in May 2022 and then she lost endocrinology follow-up.  She was following with primary care provider, with uncontrolled diabetes, referred back to endocrinology for further evaluation and management.  In 2022 patient was on Humulin  R U-500 30 units in the morning, 30 units with lunch and 60 units at dinner along with Toujeo  100 units daily.  She was on Synjardy  extended release 5/thousand 2 tablets daily.  Uncontrolled/poorly controlled diabetes mellitus for several years.  In 2022 she had relatively better control with lowest hemoglobin A1c of 7.4% after that she has hemoglobin A1c in the range of 14 to 15%.  Chronic Diabetes Complications : Retinopathy: no. Last ophthalmology exam was done on annually, reportedly.  Nephropathy: CKD III A, on losartan . Peripheral neuropathy: no Coronary artery disease: no Stroke: no  Relevant comorbidities and cardiovascular risk factors: Obesity: no Body mass index is 31.83 kg/m.  Hypertension: yes Hyperlipidemia. yes  Current / Home Diabetic regimen includes:  Lantus  20 units two times a day.   NovoLog  15 units with meals 3  times daily, not fully compliant. Glimepiride  2 mg daily.  Prior diabetic medications: Metformin . Synjardy . U-500 insulin , premixed insulin  and Toujeo  in the past.  Glycemic data:   No glucometer data to review.   Hypoglycemia: Patient has no hypoglycemic episodes. Patient has hypoglycemia awareness.  Factors modifying glucose control: 1.  Diabetic diet assessment: 3 meals a day.  Reports frequent snacking during the afternoon and at bedtime.  She also drinks sodas including Pepsi, ginger ale.  2.  Staying active or exercising: No formal exercise.  3.  Medication compliance: compliant most of the time.  Interval history  Patient was last seen in August 2024.  Patient reports she was taking insulin , diabetes regimen as reviewed and noted above.  No glucose data to review.  She checked her blood sugar today was 159, review of her glucometer directly.  Other times she checked blood sugar was in July.  Hemoglobin A1c is improved to 7.7% from 12.4% in May 2025.  She has no other complaints today.  REVIEW OF SYSTEMS As per history of present illness.   PAST MEDICAL HISTORY: Past Medical History:  Diagnosis Date   Acute massive pulmonary embolism (HCC)    ALLERGIC RHINITIS 01/29/2007   Aortic atherosclerosis (HCC)    Arthritis    ASTHMA 01/29/2007  ASTHMA, WITH ACUTE EXACERBATION 06/24/2008   BACK PAIN 01/29/2009   Cardiac arrest [facility])    CHEST PAIN 06/24/2008   Coronary artery calcification seen on CT scan    Family history of adverse reaction to anesthesia    Patients daughter has N/V after anesthesia   GERD (gastroesophageal reflux disease)    History of shingles    HYPERLIPIDEMIA 01/29/2007   HYPERTENSION 01/29/2007   OSTEOPENIA 10/16/2007   Type II or unspecified type diabetes mellitus without mention of complication, uncontrolled 11/29/2013    PAST SURGICAL HISTORY: Past Surgical History:  Procedure Laterality Date   CHOLECYSTECTOMY N/A 09/19/2015   Procedure:  LAPAROSCOPIC CHOLECYSTECTOMY WITH INTRAOPERATIVE CHOLANGIOGRAM;  Surgeon: Krystal Russell, MD;  Location: [facility];  Service: General;  Laterality: N/A;   COLONOSCOPY     TONSILLECTOMY  1956   VIDEO BRONCHOSCOPY Bilateral 03/28/2014   Procedure: VIDEO BRONCHOSCOPY WITHOUT FLUORO;  Surgeon: Ozell KATHEE America, MD;  Location: [facility];  Service: Cardiopulmonary;  Laterality: Bilateral;    ALLERGIES: Allergies  Allergen Reactions   Shingrix [Zoster Vac Recomb Adjuvanted]     Allergy to original live zoster - rash    FAMILY HISTORY:  Family History  Problem Relation Age of Onset   Cancer Mother        ? type    Diabetes Father    Hypertension Father    Heart disease Father    Asthma Father    Asthma Daughter    Colon cancer Neg Hx    Esophageal cancer Neg Hx    Rectal cancer Neg Hx    Stomach cancer Neg Hx    Breast cancer Neg Hx     SOCIAL HISTORY: Social History   Socioeconomic History   Marital status: Single    Spouse name: Not on file   Number of children: 4   Years of education: Not on file   Highest education level: Not on file  Occupational History   Occupation: RETIRED  Tobacco Use   Smoking status: Former    Current packs/day: 0.00    Types: Cigarettes    Quit date: 09/07/2003    Years since quitting: 20.3    Passive exposure: Past   Smokeless tobacco: Never  Vaping Use   Vaping status: Never Used  Substance and Sexual Activity   Alcohol use: No   Drug use: No   Sexual activity: Never  Other Topics Concern   Not on file  Social History Narrative   Lives alone/2025   Social Drivers of Health   Financial Resource Strain: Medium Risk (12/21/2023)   Overall Financial Resource Strain (CARDIA)    Difficulty of Paying Living Expenses: Somewhat hard  Food Insecurity: No Food Insecurity (12/21/2023)   Hunger Vital Sign    Worried About Running Out of Food in the Last Year: Never true    Ran Out of Food in the Last Year: Never true  Transportation Needs:  Unmet Transportation Needs (12/21/2023)   PRAPARE - Administrator, Civil Service (Medical): Yes    Lack of Transportation (Non-Medical): No  Physical Activity: Inactive (11/10/2023)   Exercise Vital Sign    Days of Exercise per Week: 0 days    Minutes of Exercise per Session: 0 min  Stress: No Stress Concern Present (11/10/2023)   [employer] of Occupational Health - Occupational Stress Questionnaire    Feeling of Stress: Not at all  Social Connections: Moderately Integrated (11/10/2023)   Social Connection and Isolation Panel  Frequency of Communication with Friends and Family: More than three times a week    Frequency of Social Gatherings with Friends and Family: More than three times a week    Attends Religious Services: More than 4 times per year    Active Member of [employer] or Organizations: Yes    Attends Banker Meetings: Never    Marital Status: Never married    MEDICATIONS:  Current Outpatient Medications  Medication Sig Dispense Refill   Accu-Chek Softclix Lancets lancets Use as directed up to four times daily as directed 100 each 0   acetaminophen  (TYLENOL ) 325 MG tablet Take 1-2 tablets (325-650 mg total) by mouth every 4 (four) hours as needed for mild pain.     alendronate  (FOSAMAX ) 70 MG tablet Take 1 tablet (70 mg total) by mouth every 7 (seven) days. Take with a full glass of water  on an empty stomach. 12 tablet 3   amLODipine  (NORVASC ) 2.5 MG tablet Take 3 tablets (7.5 mg total) by mouth daily. 90 tablet 3   apixaban  (ELIQUIS ) 5 MG TABS tablet Take 1 tablet (5 mg total) by mouth 2 (two) times daily. 60 tablet 4   blood glucose meter kit and supplies Use up to four times daily as directed. 1 each 0   Continuous Glucose Receiver (FREESTYLE LIBRE 3 READER) DEVI 1 each by Does not apply route continuous. 1 each 0   Continuous Glucose Sensor (FREESTYLE LIBRE 3 PLUS SENSOR) MISC 1 each by Does not apply route continuous. Change every 15 days. 6  each 3   fluconazole  (DIFLUCAN ) 150 MG tablet Take 1 tablet (150 mg total) by mouth daily. 1 tablet 0   furosemide  (LASIX ) 20 MG tablet Take 1 tablet (20 mg total) by mouth daily. 90 tablet 3   glimepiride  (AMARYL ) 2 MG tablet Take 1 tablet (2 mg total) by mouth daily before breakfast. 90 tablet 3   glucose blood test strip Use as directed up to four times daily as directed 400 each 3   insulin  aspart (NOVOLOG  FLEXPEN) 100 UNIT/ML FlexPen Inject 15 Units into the skin 3 (three) times daily with meals. 30 mL 0   Insulin  Pen Needle (BD PEN NEEDLE NANO U/F) 32G X 4 MM MISC Use to inject insulin  4 times daily. 100 each 0   levothyroxine  (SYNTHROID ) 75 MCG tablet Take 1 tablet (75 mcg total) by mouth daily at 6 (six) AM. 90 tablet 3   lidocaine  (LIDODERM ) 5 % Place 3 patches onto the skin daily. Remove & Discard patch within 12 hours or as directed by MD 30 patch 0   losartan  (COZAAR ) 25 MG tablet Take 1 tablet (25 mg total) by mouth daily. 90 tablet 3   magnesium  oxide (MAG-OX) 400 MG tablet Take 1 tablet (400 mg total) by mouth daily. 90 tablet 3   Multiple Vitamin (MULTIVITAMIN WITH MINERALS) TABS tablet Take 1 tablet by mouth daily.     oxyCODONE -acetaminophen  (PERCOCET/ROXICET) 5-325 MG tablet Take 1 tablet by mouth every 4 (four) hours as needed for severe pain. 15 tablet 0   potassium chloride  SA (KLOR-CON  M) 20 MEQ tablet Take 1 tablet (20 mEq total) by mouth daily. 90 tablet 3   rosuvastatin  (CRESTOR ) 20 MG tablet Take 1 tablet (20 mg total) by mouth daily. 90 tablet 3   tirzepatide  (MOUNJARO ) 2.5 MG/0.5ML Pen Inject 2.5 mg into the skin once a week. 2 mL 11   insulin  glargine (LANTUS ) 100 UNIT/ML Solostar Pen Inject 22 Units into the  skin 2 (two) times daily. 30 mL 4   No current facility-administered medications for this visit.    PHYSICAL EXAM: Vitals:   01/03/24 1025  BP: 98/60  Pulse: 94  Resp: 16  SpO2: 96%  Weight: 174 lb (78.9 kg)  Height: 5' 2 (1.575 m)   Body mass index  is 31.83 kg/m.  Wt Readings from Last 3 Encounters:  01/03/24 174 lb (78.9 kg)  11/10/23 178 lb (80.7 kg)  09/01/23 178 lb (80.7 kg)    General: Well developed, well nourished female in no apparent distress.  HEENT: AT/, no external lesions.  Eyes: Conjunctiva clear and no icterus. Neck: Neck supple  Lungs: Respirations not labored Neurologic: Alert, oriented, normal speech Extremities / Skin: Dry.   Psychiatric: Does not appear depressed or anxious  Diabetic Foot Exam - Simple   No data filed    LABS Reviewed Lab Results  Component Value Date   HGBA1C 7.7 (A) 01/03/2024   HGBA1C 12.4 (H) 09/01/2023   HGBA1C 15.0 (>) 12/21/2022   Lab Results  Component Value Date   FRUCTOSAMINE 443 (H) 02/29/2020   FRUCTOSAMINE 292 (H) 09/20/2019   FRUCTOSAMINE 277 02/12/2019   Lab Results  Component Value Date   CHOL 150 09/01/2023   HDL 46.20 09/01/2023   LDLCALC 81 09/01/2023   LDLDIRECT 153.4 01/29/2009   TRIG 115.0 09/01/2023   CHOLHDL 3 09/01/2023   Lab Results  Component Value Date   MICRALBCREAT 10.8 09/01/2023   Lab Results  Component Value Date   CREATININE 1.01 09/01/2023   Lab Results  Component Value Date   GFR 55.74 (L) 09/01/2023    ASSESSMENT / PLAN  1. Uncontrolled type 2 diabetes mellitus with hyperglycemia, with long-term current use of insulin  (HCC)     Diabetes Mellitus type 2, complicated by CKD. - Diabetic status / severity: uncontrolled, improving.   Lab Results  Component Value Date   HGBA1C 7.7 (A) 01/03/2024    - Hemoglobin A1c goal : <7%  Discussed with poor compliance with follow-up and also medications.  She has improvement in diabetes control.  No glucose data to review.  Mildly increase the dose of basal insulin  as follows.  - Medications:   I) increase Lantus  from 20 to 22 units 2 times a day. II) continue NovoLog  15 units with meals 3 times a day. III) continue glimepiride  2 mg daily.  - Home glucose testing: 4 times  a day before meals and bedtime.  Patient is advised to bring glucometer in the follow-up visit. - Discussed/ Gave Hypoglycemia treatment plan.  # Consult : not required at this time.   # Annual urine for microalbuminuria/ creatinine ratio, no microalbuminuria currently, continue ACE/ARB losartan . Last  Lab Results  Component Value Date   MICRALBCREAT 10.8 09/01/2023    # Foot check nightly.  # Annual dilated diabetic eye exams.  Advised to have diabetic eye exam.  - Diet: Make healthy diabetic food choices.  - Life style / activity / exercise: Discussed.  2. Blood pressure  -  BP Readings from Last 1 Encounters:  01/03/24 98/60    - Control is in target.  - No change in current plans.  3. Lipid status / Hyperlipidemia - Last  Lab Results  Component Value Date   LDLCALC 81 09/01/2023   - Continue rosuvastatin  20 mg daily.  # She has hypothyroidism currently on levothyroxine .  Managed by primary care provider.  Diagnoses and all orders for this visit:  Uncontrolled type  2 diabetes mellitus with hyperglycemia, with long-term current use of insulin  (HCC) -     POCT glycosylated hemoglobin (Hb A1C) -     Continuous Glucose Sensor (FREESTYLE LIBRE 3 PLUS SENSOR) MISC; 1 each by Does not apply route continuous. Change every 15 days. -     Continuous Glucose Receiver (FREESTYLE LIBRE 3 READER) DEVI; 1 each by Does not apply route continuous.  Other orders -     insulin  glargine (LANTUS ) 100 UNIT/ML Solostar Pen; Inject 22 Units into the skin 2 (two) times daily.   DISPOSITION Follow up in clinic in 3 months suggested.   All questions answered and patient verbalized understanding of the plan.  Deborah Daylynn Stumpp, MD [facility] Endocrinology [facility] [address] [place], [address] [place], [place] [address] Phone # [phone]  At least part of this note was generated using voice recognition software. Inadvertent word errors may have occurred, which were not  recognized during the proofreading process.

## 2024-01-03 NOTE — Progress Notes (Signed)
 Complex Care Management Note Care Guide Note  01/03/2024 Name: Deborah Neal MRN: [id number] DOB: 26-Nov-1950   Complex Care Management Outreach Attempts: A third unsuccessful outreach was attempted today to offer the patient with information about available complex care management services.  Follow Up Plan:  Additional outreach attempts will be made to offer the patient complex care management information and services.   Encounter Outcome:  No Answer  Adelayde Minney [facility]  [facility] Guide Direct Dial: [phone]  Fax: [phone] Website: [email or web address]

## 2024-01-03 NOTE — Patient Instructions (Signed)
 Increase lantus  to 22 units two times a day.  Novolog  15 units with meals three times a day.  Glimepiride  2mg  daily.  Bring your meter in follow up visit.  Check glucose 4 times a day.

## 2024-01-04 ENCOUNTER — Other Ambulatory Visit (HOSPITAL_COMMUNITY): Payer: Self-pay

## 2024-01-04 ENCOUNTER — Telehealth: Payer: Self-pay

## 2024-01-04 NOTE — Progress Notes (Signed)
 Complex Care Management Note Care Guide Note  01/04/2024 Name: Deborah Neal MRN: [id number] DOB: Oct 26, 1950   Complex Care Management Outreach Attempts: A third unsuccessful outreach was attempted today to offer the patient with information about available complex care management services.  Follow Up Plan:  No further outreach attempts will be made at this time. We have been unable to contact the patient to offer or enroll patient in complex care management services.Left number for [facility] [place] [phone] of patient has not received a call from them.  Encounter Outcome:  No Answer  Paxtyn Wisdom [facility]  [facility] Guide Direct Dial: [phone]  Fax: [phone] Website: [email or web address]

## 2024-01-05 ENCOUNTER — Other Ambulatory Visit (HOSPITAL_COMMUNITY): Payer: Self-pay

## 2024-01-10 ENCOUNTER — Telehealth: Payer: Self-pay | Admitting: *Deleted

## 2024-01-10 ENCOUNTER — Encounter: Payer: Self-pay | Admitting: *Deleted

## 2024-01-10 NOTE — Patient Instructions (Signed)
 [name] - I am sorry I was unable to reach you today for our scheduled appointment. I work with Deborah Neal ORN, MD and am calling to support your healthcare needs. Please contact me at [phone] at your earliest convenience. I look forward to speaking with you soon.   Thank you,   Olam Ku, RN, BSN [place]  [facility], [facility] RN Care Manager Direct Dial: [phone]  Fax: [phone]

## 2024-01-12 ENCOUNTER — Other Ambulatory Visit: Payer: Self-pay | Admitting: Endocrinology

## 2024-01-12 ENCOUNTER — Other Ambulatory Visit (HOSPITAL_COMMUNITY): Payer: Self-pay

## 2024-01-12 MED ORDER — NOVOLOG FLEXPEN 100 UNIT/ML ~~LOC~~ SOPN
15.0000 [IU] | PEN_INJECTOR | Freq: Three times a day (TID) | SUBCUTANEOUS | 4 refills | Status: AC
  Filled 2024-01-12: qty 30, 66d supply, fill #0
  Filled 2024-03-19: qty 30, 66d supply, fill #1
  Filled 2024-05-23: qty 30, 66d supply, fill #2

## 2024-01-17 ENCOUNTER — Other Ambulatory Visit: Payer: Self-pay | Admitting: Internal Medicine

## 2024-01-17 ENCOUNTER — Other Ambulatory Visit (HOSPITAL_COMMUNITY): Payer: Self-pay

## 2024-01-17 ENCOUNTER — Other Ambulatory Visit: Payer: Self-pay

## 2024-01-17 MED ORDER — AMLODIPINE BESYLATE 2.5 MG PO TABS
7.5000 mg | ORAL_TABLET | Freq: Every day | ORAL | 3 refills | Status: DC
  Filled 2024-01-17: qty 90, 30d supply, fill #0
  Filled 2024-02-15 – 2024-02-28 (×2): qty 90, 30d supply, fill #1
  Filled 2024-03-23: qty 90, 30d supply, fill #2
  Filled 2024-04-23: qty 90, 30d supply, fill #3

## 2024-01-17 MED ORDER — APIXABAN 5 MG PO TABS
5.0000 mg | ORAL_TABLET | Freq: Two times a day (BID) | ORAL | 4 refills | Status: AC
  Filled 2024-01-17: qty 60, 30d supply, fill #0
  Filled 2024-02-15 – 2024-02-28 (×2): qty 60, 30d supply, fill #1
  Filled 2024-03-23: qty 60, 30d supply, fill #2
  Filled 2024-04-23: qty 60, 30d supply, fill #3
  Filled 2024-05-22: qty 60, 30d supply, fill #4

## 2024-01-18 ENCOUNTER — Encounter: Payer: Self-pay | Admitting: *Deleted

## 2024-01-18 ENCOUNTER — Telehealth: Payer: Self-pay | Admitting: *Deleted

## 2024-01-18 NOTE — Patient Instructions (Signed)
 [place] [name] - I am sorry I was unable to reach you today for our scheduled appointment. I work with Norleen Lynwood ORN, MD and am calling to support your healthcare needs. Please contact me at [phone] at your earliest convenience. I look forward to speaking with you soon.   Thank you,   Olam Ku, RN, BSN [place]  [facility], [facility] RN Care Manager Direct Dial: [phone]  Fax: [phone]

## 2024-01-24 ENCOUNTER — Other Ambulatory Visit: Payer: Self-pay | Admitting: *Deleted

## 2024-01-24 NOTE — Patient Outreach (Unsigned)
 Complex Care Management   Visit Note  01/24/2024  Name:  Deborah Neal MRN: [id number] DOB: Jan 30, 1951  Situation: Referral received for Complex Care Management related to DMII I obtained verbal consent from Patient.  Visit completed with Patient  on the phone  Background:   Past Medical History:  Diagnosis Date   Acute massive pulmonary embolism (HCC)    ALLERGIC RHINITIS 01/29/2007   Aortic atherosclerosis    Arthritis    ASTHMA 01/29/2007   ASTHMA, WITH ACUTE EXACERBATION 06/24/2008   BACK PAIN 01/29/2009   Cardiac arrest [facility])    CHEST PAIN 06/24/2008   Coronary artery calcification seen on CT scan    Family history of adverse reaction to anesthesia    Patients daughter has N/V after anesthesia   GERD (gastroesophageal reflux disease)    History of shingles    HYPERLIPIDEMIA 01/29/2007   HYPERTENSION 01/29/2007   OSTEOPENIA 10/16/2007   Type II or unspecified type diabetes mellitus without mention of complication, uncontrolled 11/29/2013    Assessment: Patient Reported Symptoms:  Cognitive Cognitive Status: Able to follow simple commands, Alert and oriented to person, place, and time, Normal speech and language skills   Health Maintenance Behaviors: Annual physical exam, Social activities  Neurological Neurological Review of Symptoms: No symptoms reported    HEENT HEENT Symptoms Reported: No symptoms reported HEENT Management Strategies: Weight management, Routine screening    Cardiovascular Cardiovascular Symptoms Reported: No symptoms reported Cardiovascular Management Strategies: Routine screening Weight: 174 lb (78.9 kg)  Respiratory Respiratory Symptoms Reported: No symptoms reported Respiratory Management Strategies: Routine screening  Endocrine Endocrine Symptoms Reported: No symptoms reported Is patient diabetic?: Yes Is patient checking blood sugars at home?: Yes List most recent blood sugar readings, include date and time of day: 150 fasting glucose this  morning 01/24/2024 reported by patient Endocrine Self-Management Outcome: 4 (good)  Gastrointestinal Gastrointestinal Symptoms Reported: No symptoms reported Gastrointestinal Management Strategies: Medication therapy    Genitourinary Genitourinary Symptoms Reported: No symptoms reported    Integumentary Integumentary Symptoms Reported: No symptoms reported Skin Management Strategies: Routine screening  Musculoskeletal Musculoskelatal Symptoms Reviewed: No symptoms reported Musculoskeletal Management Strategies: Routine screening      Psychosocial Psychosocial Symptoms Reported: No symptoms reported           There were no vitals filed for this visit.  Medications Reviewed Today     Reviewed by Alvia Olam BIRCH, RN (Registered Nurse) on 01/24/24 at 1119  Med List Status: <None>   Medication Order Taking? Sig Documenting Provider Last Dose Status Informant  Accu-Chek Softclix Lancets lancets [id number] Yes Use as directed up to four times daily as directed   Active   acetaminophen  (TYLENOL ) 325 MG tablet [id number] Yes Take 1-2 tablets (325-650 mg total) by mouth every 4 (four) hours as needed for mild pain. Setzer, Nena PARAS, PA-C  Active   alendronate  (FOSAMAX ) 70 MG tablet [id number] Yes Take 1 tablet (70 mg total) by mouth every 7 (seven) days. Take with a full glass of water  on an empty stomach. Norleen Lynwood ORN, MD  Active   amLODipine  (NORVASC ) 2.5 MG tablet [id number] Yes Take 3 tablets (7.5 mg total) by mouth daily. Norleen Lynwood ORN, MD  Active   apixaban  (ELIQUIS ) 5 MG TABS tablet [id number] Yes Take 1 tablet (5 mg total) by mouth 2 (two) times daily. Norleen Lynwood ORN, MD  Active   blood glucose meter kit and supplies [id number] Yes Use up to four times daily as directed. Setzer, Sandra J, PA-C  Active   Continuous Glucose Receiver (FREESTYLE LIBRE 3 READER) DEVI [id number] Yes Use to continuously monitor blood sugar. Thapa, [place], MD  Active   Continuous Glucose Sensor (FREESTYLE LIBRE 3  PLUS SENSOR) [place] [id number] Yes Use to continuously monitor blood sugar, changing the sensor every 15 days. Thapa, [place], MD  Active   fluconazole  (DIFLUCAN ) 150 MG tablet [id number]  Take 1 tablet (150 mg total) by mouth daily.  Patient not taking: Reported on 01/24/2024   Dean Clarity, MD  Active   furosemide  (LASIX ) 20 MG tablet [id number] Yes Take 1 tablet (20 mg total) by mouth daily. Norleen Lynwood ORN, MD  Active   glimepiride  (AMARYL ) 2 MG tablet [id number] Yes Take 1 tablet (2 mg total) by mouth daily before breakfast. Thapa, [place], MD  Active   glucose blood test strip [id number] Yes Use as directed up to four times daily as directed Norleen Lynwood ORN, MD  Active   insulin  aspart (NOVOLOG  FLEXPEN) 100 UNIT/ML FlexPen [id number] Yes Inject 15 Units into the skin 3 (three) times daily with meals. Thapa, [place], MD  Active   insulin  glargine (LANTUS ) 100 UNIT/ML Solostar Pen [id number] Yes Inject 22 Units into the skin 2 (two) times daily. Thapa, [place], MD  Active   Insulin  Pen Needle (BD PEN NEEDLE NANO U/F) 32G X 4 MM MISC [id number] Yes Use to inject insulin  4 times daily. Norleen Lynwood ORN, MD  Active   levothyroxine  (SYNTHROID ) 75 MCG tablet [id number] Yes Take 1 tablet (75 mcg total) by mouth daily at 6 (six) AM. Norleen Lynwood ORN, MD  Active   lidocaine  (LIDODERM ) 5 % [id number]  Place 3 patches onto the skin daily. Remove & Discard patch within 12 hours or as directed by MD  Patient not taking: Reported on 01/24/2024   Rosendo Nena PARAS, PA-C  Active     Discontinued 07/30/22 1730 (Entry Error)            Med Note>> Octavia Donald CROME, [facility]   07/30/2022  5:30 PM wrong pt    losartan  (COZAAR ) 25 MG tablet [id number] Yes Take 1 tablet (25 mg total) by mouth daily. Norleen Lynwood ORN, MD  Active   magnesium  oxide (MAG-OX) 400 MG tablet [id number] Yes Take 1 tablet (400 mg total) by mouth daily. Norleen Lynwood ORN, MD  Active   Multiple Vitamin (MULTIVITAMIN WITH MINERALS) TABS tablet [id number] Yes Take 1 tablet by mouth  daily. Setzer, Nena PARAS, PA-C  Active   oxyCODONE -acetaminophen  (PERCOCET/ROXICET) 5-325 MG tablet [id number] Yes Take 1 tablet by mouth every 4 (four) hours as needed for severe pain. Tegeler, Lonni PARAS, MD  Active   potassium chloride  SA (KLOR-CON  M) 20 MEQ tablet [id number] Yes Take 1 tablet (20 mEq total) by mouth daily. Norleen Lynwood ORN, MD  Active   rosuvastatin  (CRESTOR ) 20 MG tablet [id number] Yes Take 1 tablet (20 mg total) by mouth daily. Norleen Lynwood ORN, MD  Active   tirzepatide  (MOUNJARO ) 2.5 MG/0.5ML Pen [id number] Yes Inject 2.5 mg into the skin once a week. Norleen Lynwood ORN, MD  Active             Recommendation:   PCP Follow-up Specialty provider follow-up 04/03/2024 Endocrinologist Continue Current Plan of Care  Follow Up Plan:   Telephone follow up appointment date/time:  02/20/2024 @ 11:00 am  Olam Ku, RN, BSN [place]  [facility], [facility] RN Care Manager Direct Dial: [phone]  Fax: [phone]

## 2024-01-24 NOTE — Patient Instructions (Signed)
 Visit Information  Thank you for taking time to visit with me today. Please don't hesitate to contact me if I can be of assistance to you before our next scheduled appointment.  Your next care management appointment is by telephone on 02/20/2024 at 11:00 am  Please call the care guide team at [phone] if you need to cancel, schedule, or reschedule an appointment.   Please call the Suicide and Crisis Lifeline: 988 call the [place]  National Suicide Prevention Lifeline: 856-628-9607 or TTY: 409-782-0859 TTY (204) 662-3235) to talk to a trained counselor call 1-800-273-TALK (toll free, 24 hour hotline) if you are experiencing a Mental Health or Behavioral Health Crisis or need someone to talk to.   Olam Ku, RN, BSN [place]  [facility], [facility] RN Care Manager Direct Dial: [phone]  Fax: [phone]

## 2024-01-26 DIAGNOSIS — E1165 Type 2 diabetes mellitus with hyperglycemia: Secondary | ICD-10-CM | POA: Diagnosis not present

## 2024-01-26 DIAGNOSIS — E785 Hyperlipidemia, unspecified: Secondary | ICD-10-CM | POA: Diagnosis not present

## 2024-01-26 DIAGNOSIS — Z79890 Hormone replacement therapy (postmenopausal): Secondary | ICD-10-CM | POA: Diagnosis not present

## 2024-01-26 DIAGNOSIS — E039 Hypothyroidism, unspecified: Secondary | ICD-10-CM | POA: Diagnosis not present

## 2024-01-26 DIAGNOSIS — Z833 Family history of diabetes mellitus: Secondary | ICD-10-CM | POA: Diagnosis not present

## 2024-01-26 DIAGNOSIS — D6869 Other thrombophilia: Secondary | ICD-10-CM | POA: Diagnosis not present

## 2024-01-26 DIAGNOSIS — I509 Heart failure, unspecified: Secondary | ICD-10-CM | POA: Diagnosis not present

## 2024-01-26 DIAGNOSIS — E1122 Type 2 diabetes mellitus with diabetic chronic kidney disease: Secondary | ICD-10-CM | POA: Diagnosis not present

## 2024-01-26 DIAGNOSIS — I700 Atherosclerosis of aorta: Secondary | ICD-10-CM | POA: Diagnosis not present

## 2024-01-26 DIAGNOSIS — Z59869 Financial insecurity, unspecified: Secondary | ICD-10-CM | POA: Diagnosis not present

## 2024-01-26 DIAGNOSIS — J45909 Unspecified asthma, uncomplicated: Secondary | ICD-10-CM | POA: Diagnosis not present

## 2024-01-26 DIAGNOSIS — I130 Hypertensive heart and chronic kidney disease with heart failure and stage 1 through stage 4 chronic kidney disease, or unspecified chronic kidney disease: Secondary | ICD-10-CM | POA: Diagnosis not present

## 2024-01-26 DIAGNOSIS — G4730 Sleep apnea, unspecified: Secondary | ICD-10-CM | POA: Diagnosis not present

## 2024-01-26 DIAGNOSIS — M8580 Other specified disorders of bone density and structure, unspecified site: Secondary | ICD-10-CM | POA: Diagnosis not present

## 2024-01-26 DIAGNOSIS — Z87891 Personal history of nicotine dependence: Secondary | ICD-10-CM | POA: Diagnosis not present

## 2024-01-26 DIAGNOSIS — Z7901 Long term (current) use of anticoagulants: Secondary | ICD-10-CM | POA: Diagnosis not present

## 2024-01-26 DIAGNOSIS — N1831 Chronic kidney disease, stage 3a: Secondary | ICD-10-CM | POA: Diagnosis not present

## 2024-01-26 DIAGNOSIS — M545 Low back pain, unspecified: Secondary | ICD-10-CM | POA: Diagnosis not present

## 2024-01-26 DIAGNOSIS — Z794 Long term (current) use of insulin: Secondary | ICD-10-CM | POA: Diagnosis not present

## 2024-01-26 DIAGNOSIS — I4891 Unspecified atrial fibrillation: Secondary | ICD-10-CM | POA: Diagnosis not present

## 2024-01-26 DIAGNOSIS — R32 Unspecified urinary incontinence: Secondary | ICD-10-CM | POA: Diagnosis not present

## 2024-01-26 DIAGNOSIS — E876 Hypokalemia: Secondary | ICD-10-CM | POA: Diagnosis not present

## 2024-01-30 ENCOUNTER — Encounter: Payer: Self-pay | Admitting: Internal Medicine

## 2024-02-20 ENCOUNTER — Other Ambulatory Visit: Payer: Self-pay | Admitting: *Deleted

## 2024-02-20 NOTE — Patient Instructions (Signed)
 Visit Information  Thank you for taking time to visit with me today. Please don't hesitate to contact me if I can be of assistance to you before our next scheduled appointment.  Your next care management appointment is by telephone on 03/20/2024 at 11:30 am   Please call the care guide team at [phone] if you need to cancel, schedule, or reschedule an appointment.   Please call the Suicide and Crisis Lifeline: 988 call the [place]  National Suicide Prevention Lifeline: 534-352-6905 or TTY: (970)616-4833 TTY 438-737-1017) to talk to a trained counselor call 1-800-273-TALK (toll free, 24 hour hotline) if you are experiencing a Mental Health or Behavioral Health Crisis or need someone to talk to.   Olam Ku, RN, BSN [place]  [facility], [facility] RN Care Manager Direct Dial: [phone]  Fax: [phone]

## 2024-02-20 NOTE — Patient Outreach (Signed)
 Complex Care Management   Visit Note  02/20/2024  Name:  Deborah Neal MRN: [id number] DOB: February 16, 1951  Situation: Referral received for Complex Care Management related to DMII I obtained verbal consent from Patient.  Visit completed with Patient  on the phone  Background:   Past Medical History:  Diagnosis Date   Acute massive pulmonary embolism (HCC)    ALLERGIC RHINITIS 01/29/2007   Aortic atherosclerosis    Arthritis    ASTHMA 01/29/2007   ASTHMA, WITH ACUTE EXACERBATION 06/24/2008   BACK PAIN 01/29/2009   Cardiac arrest [facility])    CHEST PAIN 06/24/2008   Coronary artery calcification seen on CT scan    Family history of adverse reaction to anesthesia    Patients daughter has N/V after anesthesia   GERD (gastroesophageal reflux disease)    History of shingles    HYPERLIPIDEMIA 01/29/2007   HYPERTENSION 01/29/2007   OSTEOPENIA 10/16/2007   Type II or unspecified type diabetes mellitus without mention of complication, uncontrolled 11/29/2013    Assessment:  -Followed up with SDOH and pt reports she did not received a call from the care-guide. Further research indicates several calls were made that were unsuccessful and information was left. RNCM provided the same information noted in EPIC form the care-guide on the call today for [facility] [place] contact for [facility] [phone]) along with CG and RNCM contact number if further interventions were needed.   Patient Reported Symptoms:  Cognitive Cognitive Status: Able to follow simple commands, Alert and oriented to person, place, and time, Normal speech and language skills   Health Maintenance Behaviors: Annual physical exam  Neurological Neurological Review of Symptoms: No symptoms reported    HEENT HEENT Symptoms Reported: No symptoms reported HEENT Management Strategies: Routine screening    Cardiovascular Cardiovascular Symptoms Reported: No symptoms reported Cardiovascular Management Strategies: Routine screening   Respiratory Respiratory Symptoms Reported: No symptoms reported Respiratory Management Strategies: Routine screening  Endocrine Endocrine Symptoms Reported: No symptoms reported Is patient diabetic?: Yes Is patient checking blood sugars at home?: Yes List most recent blood sugar readings, include date and time of day: 172 fasting this AM on Glucose reading after candy last night with an average of 140-172 with AM reads on [facility] End[facility] Self-Management Outcome: 4 (good)  Gastrointestinal Gastrointestinal Symptoms Reported: No symptoms reported      Genitourinary Genitourinary Symptoms Reported: Incontinence Genitourinary Management Strategies: Incontinence garment/pad Genitourinary Self-Management Outcome: 4 (good)  Integumentary Integumentary Symptoms Reported: No symptoms reported Skin Management Strategies: Routine screening  Musculoskeletal Musculoskelatal Symptoms Reviewed: No symptoms reported Musculoskeletal Management Strategies: Routine screening      Psychosocial Psychosocial Symptoms Reported: No symptoms reported          There were no vitals filed for this visit.  Medications Reviewed Today     Reviewed by Alvia Olam BIRCH, RN (Registered Nurse) on 02/20/24 at 1109  Med List Status: <None>   Medication Order Taking? Sig Documenting Provider Last Dose Status Informant  Accu-Chek Softclix Lancets lancets [id number] Yes Use as directed up to four times daily as directed   Active   acetaminophen  (TYLENOL ) 325 MG tablet [id number] Yes Take 1-2 tablets (325-650 mg total) by mouth every 4 (four) hours as needed for mild pain. Setzer, Nena PARAS, PA-C  Active   alendronate  (FOSAMAX ) 70 MG tablet [id number] Yes Take 1 tablet (70 mg total) by mouth every 7 (seven) days. Take with a full glass of water  on an empty stomach. Norleen Lynwood ORN, MD  Active  amLODipine  (NORVASC ) 2.5 MG tablet [id number] Yes Take 3 tablets (7.5 mg total) by mouth daily. Norleen Lynwood ORN, MD  Active   apixaban   (ELIQUIS ) 5 MG TABS tablet [id number] Yes Take 1 tablet (5 mg total) by mouth 2 (two) times daily. Norleen Lynwood ORN, MD  Active   blood glucose meter kit and supplies [id number] Yes Use up to four times daily as directed. Setzer, Nena PARAS, PA-C  Active   Continuous Glucose Receiver (FREESTYLE LIBRE 3 READER) DEVI [id number] Yes Use to continuously monitor blood sugar. Thapa, [place], MD  Active   Continuous Glucose Sensor (FREESTYLE LIBRE 3 PLUS SENSOR) [place] [id number] Yes Use to continuously monitor blood sugar, changing the sensor every 15 days. Thapa, [place], MD  Active   fluconazole  (DIFLUCAN ) 150 MG tablet [id number]  Take 1 tablet (150 mg total) by mouth daily.  Patient not taking: Reported on 01/24/2024   Dean Clarity, MD  Active   furosemide  (LASIX ) 20 MG tablet [id number] Yes Take 1 tablet (20 mg total) by mouth daily. Norleen Lynwood ORN, MD  Active   glimepiride  (AMARYL ) 2 MG tablet [id number] Yes Take 1 tablet (2 mg total) by mouth daily before breakfast. Thapa, [place], MD  Active   glucose blood test strip [id number] Yes Use as directed up to four times daily as directed Norleen Lynwood ORN, MD  Active   insulin  aspart (NOVOLOG  FLEXPEN) 100 UNIT/ML FlexPen [id number] Yes Inject 15 Units into the skin 3 (three) times daily with meals. Thapa, [place], MD  Active   insulin  glargine (LANTUS ) 100 UNIT/ML Solostar Pen [id number] Yes Inject 22 Units into the skin 2 (two) times daily. Thapa, [place], MD  Active   Insulin  Pen Needle (BD PEN NEEDLE NANO U/F) 32G X 4 MM MISC [id number] Yes Use to inject insulin  4 times daily. Norleen Lynwood ORN, MD  Active   levothyroxine  (SYNTHROID ) 75 MCG tablet [id number] Yes Take 1 tablet (75 mcg total) by mouth daily at 6 (six) AM. Norleen Lynwood ORN, MD  Active   lidocaine  (LIDODERM ) 5 % [id number]  Place 3 patches onto the skin daily. Remove & Discard patch within 12 hours or as directed by MD  Patient not taking: Reported on 01/24/2024   Rosendo Nena PARAS, PA-C  Active     Discontinued 07/30/22 1730  (Entry Error)            Med Note>> Octavia Donald CROME, [facility]   07/30/2022  5:30 PM wrong pt    losartan  (COZAAR ) 25 MG tablet [id number] Yes Take 1 tablet (25 mg total) by mouth daily. Norleen Lynwood ORN, MD  Active   magnesium  oxide (MAG-OX) 400 MG tablet [id number] Yes Take 1 tablet (400 mg total) by mouth daily. Norleen Lynwood ORN, MD  Active   Multiple Vitamin (MULTIVITAMIN WITH MINERALS) TABS tablet [id number] Yes Take 1 tablet by mouth daily. Setzer, Sandra J, PA-C  Active   oxyCODONE -acetaminophen  (PERCOCET/ROXICET) 5-325 MG tablet [id number] Yes Take 1 tablet by mouth every 4 (four) hours as needed for severe pain. Tegeler, Lonni PARAS, MD  Active   potassium chloride  SA (KLOR-CON  M) 20 MEQ tablet [id number] Yes Take 1 tablet (20 mEq total) by mouth daily. Norleen Lynwood ORN, MD  Active   rosuvastatin  (CRESTOR ) 20 MG tablet [id number] Yes Take 1 tablet (20 mg total) by mouth daily. Norleen Lynwood ORN, MD  Active   tirzepatide  (MOUNJARO ) 2.5 MG/0.5ML Pen [id number] Yes Inject 2.5 mg into the skin once a week. Norleen Lynwood ORN, MD  Active  Recommendation:   PCP Follow-up Continue Current Plan of Care  Follow Up Plan:   Telephone follow up appointment date/time:  03/20/2024 @ 11:30 AM   Olam Ku, RN, BSN [place]  [facility], [facility] Health RN Care Manager Direct Dial: [phone]  Fax: [phone]

## 2024-02-27 ENCOUNTER — Other Ambulatory Visit (HOSPITAL_COMMUNITY): Payer: Self-pay

## 2024-02-29 ENCOUNTER — Other Ambulatory Visit (HOSPITAL_COMMUNITY): Payer: Self-pay

## 2024-03-05 ENCOUNTER — Encounter: Payer: Self-pay | Admitting: Internal Medicine

## 2024-03-05 ENCOUNTER — Other Ambulatory Visit (HOSPITAL_COMMUNITY): Payer: Self-pay

## 2024-03-05 ENCOUNTER — Ambulatory Visit: Admitting: Internal Medicine

## 2024-03-05 VITALS — BP 106/70 | HR 85 | Temp 97.7°F | Ht 62.0 in | Wt 174.0 lb

## 2024-03-05 DIAGNOSIS — E119 Type 2 diabetes mellitus without complications: Secondary | ICD-10-CM

## 2024-03-05 DIAGNOSIS — E7800 Pure hypercholesterolemia, unspecified: Secondary | ICD-10-CM

## 2024-03-05 DIAGNOSIS — Z23 Encounter for immunization: Secondary | ICD-10-CM

## 2024-03-05 DIAGNOSIS — Z794 Long term (current) use of insulin: Secondary | ICD-10-CM

## 2024-03-05 DIAGNOSIS — E559 Vitamin D deficiency, unspecified: Secondary | ICD-10-CM | POA: Diagnosis not present

## 2024-03-05 DIAGNOSIS — I10 Essential (primary) hypertension: Secondary | ICD-10-CM

## 2024-03-05 MED ORDER — TIRZEPATIDE 5 MG/0.5ML ~~LOC~~ SOAJ
5.0000 mg | SUBCUTANEOUS | 3 refills | Status: AC
  Filled 2024-03-05: qty 6, 84d supply, fill #0
  Filled 2024-05-28: qty 6, 84d supply, fill #1

## 2024-03-05 NOTE — Assessment & Plan Note (Signed)
 BP Readings from Last 3 Encounters:  03/05/24 106/70  01/03/24 98/60  09/01/23 124/82   Stable, pt to continue medical treatment norvasc  2.5 mg every day, losartan  25 mg qd

## 2024-03-05 NOTE — Patient Instructions (Signed)
 You had the flu shot today  Ok to increase the mounjaro  to 5 mg weekly  Please continue all other medications as before, and refills have been done if requested.  Please have the pharmacy call with any other refills you may need.  Please continue your efforts at being more active, low cholesterol diet, and weight control.  Please keep your appointments with your specialists as you may have planned - Endocrinology  Please make an Appointment to return in 6 months, or sooner if needed

## 2024-03-05 NOTE — Assessment & Plan Note (Signed)
 Last vitamin D  Lab Results  Component Value Date   VD25OH 39.93 09/01/2023   Low, to start oral replacement

## 2024-03-05 NOTE — Assessment & Plan Note (Addendum)
 Lab Results  Component Value Date   HGBA1C 7.7 (A) 01/03/2024   Mild uncontrolled, pt to continue current medical treatment amaryl  2 mg every day, novolog  15 u tid, lantus  22 u every day, and increased mounjaro  5 mg weekly, f/u endo as planned     Follow with Dr Mercie

## 2024-03-05 NOTE — Progress Notes (Signed)
 Patient ID: Deborah Neal, female   DOB: 09-08-1950, 72 y.o.   MRN: [id number]        Chief Complaint: follow up HTN, HLD, dm, low vit d       HPI:  Deborah Neal is a 73 y.o. female here with c/o overall doing ok, Pt denies chest pain, increased sob or doe, wheezing, orthopnea, PND, increased LE swelling, palpitations, dizziness or syncope.   Pt denies polydipsia, polyuria, or new focal neuro s/s.   Due for flu shot today.  Wt has plateued no longer losing wt.         Wt Readings from Last 3 Encounters:  03/05/24 174 lb (78.9 kg)  01/24/24 174 lb (78.9 kg)  01/03/24 174 lb (78.9 kg)   BP Readings from Last 3 Encounters:  03/05/24 106/70  01/03/24 98/60  09/01/23 124/82         Past Medical History:  Diagnosis Date   Acute massive pulmonary embolism (HCC)    ALLERGIC RHINITIS 01/29/2007   Aortic atherosclerosis    Arthritis    ASTHMA 01/29/2007   ASTHMA, WITH ACUTE EXACERBATION 06/24/2008   BACK PAIN 01/29/2009   Cardiac arrest [facility])    CHEST PAIN 06/24/2008   Coronary artery calcification seen on CT scan    Family history of adverse reaction to anesthesia    Patients daughter has N/V after anesthesia   GERD (gastroesophageal reflux disease)    History of shingles    HYPERLIPIDEMIA 01/29/2007   HYPERTENSION 01/29/2007   OSTEOPENIA 10/16/2007   Type II or unspecified type diabetes mellitus without mention of complication, uncontrolled 11/29/2013   Past Surgical History:  Procedure Laterality Date   CHOLECYSTECTOMY N/A 09/19/2015   Procedure: LAPAROSCOPIC CHOLECYSTECTOMY WITH INTRAOPERATIVE CHOLANGIOGRAM;  Surgeon: Krystal Russell, MD;  Location: [facility] OR;  Service: General;  Laterality: N/A;   COLONOSCOPY     TONSILLECTOMY  1956   VIDEO BRONCHOSCOPY Bilateral 03/28/2014   Procedure: VIDEO BRONCHOSCOPY WITHOUT FLUORO;  Surgeon: Ozell KATHEE America, MD;  Location: [facility] ENDOSCOPY;  Service: Cardiopulmonary;  Laterality: Bilateral;    reports that she quit smoking about 20 years ago. Her  smoking use included cigarettes. She has been exposed to tobacco smoke. She has never used smokeless tobacco. She reports that she does not drink alcohol and does not use drugs. family history includes Asthma in her daughter and father; Cancer in her mother; Diabetes in her father; Heart disease in her father; Hypertension in her father. Allergies  Allergen Reactions   Shingrix [Zoster Vac Recomb Adjuvanted]     Allergy to original live zoster - rash   Current Outpatient Medications on File Prior to Visit  Medication Sig Dispense Refill   Accu-Chek Softclix Lancets lancets Use as directed up to four times daily as directed 100 each 0   acetaminophen  (TYLENOL ) 325 MG tablet Take 1-2 tablets (325-650 mg total) by mouth every 4 (four) hours as needed for mild pain.     alendronate  (FOSAMAX ) 70 MG tablet Take 1 tablet (70 mg total) by mouth every 7 (seven) days. Take with a full glass of water  on an empty stomach. 12 tablet 3   amLODipine  (NORVASC ) 2.5 MG tablet Take 3 tablets (7.5 mg total) by mouth daily. 90 tablet 3   apixaban  (ELIQUIS ) 5 MG TABS tablet Take 1 tablet (5 mg total) by mouth 2 (two) times daily. 60 tablet 4   blood glucose meter kit and supplies Use up to four times daily as directed. 1 each  0   Continuous Glucose Receiver (FREESTYLE LIBRE 3 READER) DEVI Use to continuously monitor blood sugar. 1 each 0   Continuous Glucose Sensor (FREESTYLE LIBRE 3 PLUS SENSOR) MISC Use to continuously monitor blood sugar, changing the sensor every 15 days. 6 each 3   furosemide  (LASIX ) 20 MG tablet Take 1 tablet (20 mg total) by mouth daily. 90 tablet 3   glimepiride  (AMARYL ) 2 MG tablet Take 1 tablet (2 mg total) by mouth daily before breakfast. 90 tablet 3   glucose blood test strip Use as directed up to four times daily as directed 400 each 3   insulin  aspart (NOVOLOG  FLEXPEN) 100 UNIT/ML FlexPen Inject 15 Units into the skin 3 (three) times daily with meals. 30 mL 4   insulin  glargine (LANTUS )  100 UNIT/ML Solostar Pen Inject 22 Units into the skin 2 (two) times daily. 30 mL 4   Insulin  Pen Needle (BD PEN NEEDLE NANO U/F) 32G X 4 MM MISC Use to inject insulin  4 times daily. 100 each 0   levothyroxine  (SYNTHROID ) 75 MCG tablet Take 1 tablet (75 mcg total) by mouth daily at 6 (six) AM. 90 tablet 3   losartan  (COZAAR ) 25 MG tablet Take 1 tablet (25 mg total) by mouth daily. 90 tablet 3   magnesium  oxide (MAG-OX) 400 MG tablet Take 1 tablet (400 mg total) by mouth daily. 90 tablet 3   Multiple Vitamin (MULTIVITAMIN WITH MINERALS) TABS tablet Take 1 tablet by mouth daily.     oxyCODONE -acetaminophen  (PERCOCET/ROXICET) 5-325 MG tablet Take 1 tablet by mouth every 4 (four) hours as needed for severe pain. 15 tablet 0   potassium chloride  SA (KLOR-CON  M) 20 MEQ tablet Take 1 tablet (20 mEq total) by mouth daily. 90 tablet 3   rosuvastatin  (CRESTOR ) 20 MG tablet Take 1 tablet (20 mg total) by mouth daily. 90 tablet 3   lidocaine  (LIDODERM ) 5 % Place 3 patches onto the skin daily. Remove & Discard patch within 12 hours or as directed by MD (Patient not taking: Reported on 03/05/2024) 30 patch 0   [DISCONTINUED] lisdexamfetamine (VYVANSE ) 70 MG capsule Take 1 capsule (70 mg total) by mouth daily. 30 capsule 0   No current facility-administered medications on file prior to visit.        ROS:  All others reviewed and negative.  Objective        PE:  BP 106/70   Pulse 85   Temp 97.7 F (36.5 C) (Temporal)   Ht 5' 2 (1.575 m)   Wt 174 lb (78.9 kg)   SpO2 95%   BMI 31.83 kg/m                 Constitutional: Pt appears in NAD               HENT: Head: NCAT.                Right Ear: External ear normal.                 Left Ear: External ear normal.                Eyes: . Pupils are equal, round, and reactive to light. Conjunctivae and EOM are normal               Nose: without d/c or deformity               Neck: Neck supple. Gross normal ROM  Cardiovascular: Normal rate  and regular rhythm.                 Pulmonary/Chest: Effort normal and breath sounds without rales or wheezing.                Abd:  Soft, NT, ND, + BS, no organomegaly               Neurological: Pt is alert. At baseline orientation, motor grossly intact               Skin: Skin is warm. No rashes, no other new lesions, LE edema - none               Psychiatric: Pt behavior is normal without agitation   Micro: none  Cardiac tracings I have personally interpreted today:  none  Pertinent Radiological findings (summarize): none   Lab Results  Component Value Date   WBC 6.3 09/01/2023   HGB 13.1 09/01/2023   HCT 42.1 09/01/2023   PLT 207.0 09/01/2023   GLUCOSE 266 (H) 09/01/2023   CHOL 150 09/01/2023   TRIG 115.0 09/01/2023   HDL 46.20 09/01/2023   LDLDIRECT 153.4 01/29/2009   LDLCALC 81 09/01/2023   ALT 19 09/01/2023   AST 18 09/01/2023   NA 138 09/01/2023   K 3.7 09/01/2023   CL 100 09/01/2023   CREATININE 1.01 09/01/2023   BUN 24 (H) 09/01/2023   CO2 29 09/01/2023   TSH 2.28 09/01/2023   INR 1.1 01/19/2023   HGBA1C 7.7 (A) 01/03/2024   MICROALBUR 2.5 (H) 09/01/2023   Assessment/Plan:  Deborah Neal is a 73 y.o. Black or African American [2] female with  has a past medical history of Acute massive pulmonary embolism (HCC), ALLERGIC RHINITIS (01/29/2007), Aortic atherosclerosis, Arthritis, ASTHMA (01/29/2007), ASTHMA, WITH ACUTE EXACERBATION (06/24/2008), BACK PAIN (01/29/2009), Cardiac arrest [facility]), CHEST PAIN (06/24/2008), Coronary artery calcification seen on CT scan, Family history of adverse reaction to anesthesia, GERD (gastroesophageal reflux disease), History of shingles, HYPERLIPIDEMIA (01/29/2007), HYPERTENSION (01/29/2007), OSTEOPENIA (10/16/2007), and Type II or unspecified type diabetes mellitus without mention of complication, uncontrolled (11/29/2013).  Type 2 diabetes mellitus without complication, with long-term current use of insulin  (HCC) Lab Results   Component Value Date   HGBA1C 7.7 (A) 01/03/2024   Mild uncontrolled, pt to continue current medical treatment amaryl  2 mg every day, novolog  15 u tid, lantus  22 u every day, and increased mounjaro  5 mg weekly, f/u endo as planned     Follow with Dr Mercie  Vitamin D  deficiency Last vitamin D  Lab Results  Component Value Date   VD25OH 39.93 09/01/2023   Low, to start oral replacement   HLD (hyperlipidemia) Lab Results  Component Value Date   LDLCALC 81 09/01/2023   Mild uncontrolled, pt to continue current statin lipitor 20 mg and lower chol diet, declines other change today    Essential hypertension BP Readings from Last 3 Encounters:  03/05/24 106/70  01/03/24 98/60  09/01/23 124/82   Stable, pt to continue medical treatment norvasc  2.5 mg every day, losartan  25 mg qd  Followup: Return in about 6 months (around 09/02/2024).  Lynwood Rush, MD 03/05/2024 6:33 PM [facility]  Primary Care - [facility] Internal Medicine

## 2024-03-05 NOTE — Assessment & Plan Note (Signed)
 Lab Results  Component Value Date   LDLCALC 81 09/01/2023   Mild uncontrolled, pt to continue current statin lipitor 20 mg and lower chol diet, declines other change today

## 2024-03-20 ENCOUNTER — Telehealth: Payer: Self-pay | Admitting: *Deleted

## 2024-03-20 ENCOUNTER — Encounter: Payer: Self-pay | Admitting: *Deleted

## 2024-03-20 NOTE — Patient Instructions (Signed)
 [place] [name] - I am sorry I was unable to reach you today for our scheduled appointment. I work with Norleen Lynwood ORN, MD and am calling to support your healthcare needs. Please contact me at [phone] at your earliest convenience. I look forward to speaking with you soon.   Thank you,   Olam Ku, RN, BSN [place]  [facility], [facility] RN Care Manager Direct Dial: [phone]  Fax: [phone]

## 2024-03-23 ENCOUNTER — Encounter: Payer: Self-pay | Admitting: *Deleted

## 2024-03-23 ENCOUNTER — Telehealth: Payer: Self-pay | Admitting: *Deleted

## 2024-03-23 NOTE — Patient Instructions (Signed)
 [place] [name] - I am sorry I was unable to reach you today for our scheduled appointment. I work with Norleen Lynwood ORN, MD and am calling to support your healthcare needs. Please contact me at [phone] at your earliest convenience. I look forward to speaking with you soon.   Thank you,   Olam Ku, RN, BSN [place]  [facility], [facility] RN Care Manager Direct Dial: [phone]  Fax: [phone]

## 2024-03-28 ENCOUNTER — Encounter: Payer: Self-pay | Admitting: *Deleted

## 2024-03-28 ENCOUNTER — Telehealth: Payer: Self-pay | Admitting: *Deleted

## 2024-03-28 NOTE — Patient Instructions (Signed)
 [name] - I have attempted to call you three times but have been unsuccessful in reaching you. I work with Deborah Neal ORN, MD and am calling to support your healthcare needs. If I can be of assistance to you, please contact me at [phone].     Thank you,   Olam Ku, RN, BSN [place]  [facility], [facility] Health RN Care Manager Direct Dial: [phone]  Fax: [phone]

## 2024-03-28 NOTE — Patient Outreach (Signed)
 Complex Care Management   Visit Note  03/28/2024  Name:  Deborah Neal MRN: [id number] DOB: November 09, 1950  Situation: Several unsuccessful outreach calls to pt with no response. Case will be closed at this time.  Follow Up Plan:   Closing From:  Complex Care Management   Olam Ku, RN, BSN [place]  [facility], [facility] RN Care Manager Direct Dial: [phone]  Fax: [phone]

## 2024-03-29 ENCOUNTER — Other Ambulatory Visit (HOSPITAL_COMMUNITY): Payer: Self-pay

## 2024-04-03 ENCOUNTER — Ambulatory Visit: Admitting: Endocrinology

## 2024-04-05 ENCOUNTER — Other Ambulatory Visit: Payer: Self-pay

## 2024-04-05 ENCOUNTER — Other Ambulatory Visit (HOSPITAL_COMMUNITY): Payer: Self-pay

## 2024-04-10 ENCOUNTER — Other Ambulatory Visit (HOSPITAL_COMMUNITY): Payer: Self-pay

## 2024-04-24 ENCOUNTER — Other Ambulatory Visit (HOSPITAL_COMMUNITY): Payer: Self-pay

## 2024-05-04 ENCOUNTER — Other Ambulatory Visit (HOSPITAL_COMMUNITY): Payer: Self-pay

## 2024-05-04 ENCOUNTER — Other Ambulatory Visit: Payer: Self-pay | Admitting: Internal Medicine

## 2024-05-04 ENCOUNTER — Other Ambulatory Visit: Payer: Self-pay

## 2024-05-04 MED ORDER — LEVOTHYROXINE SODIUM 75 MCG PO TABS
75.0000 ug | ORAL_TABLET | Freq: Every day | ORAL | 3 refills | Status: AC
  Filled 2024-05-04: qty 90, 90d supply, fill #0

## 2024-05-07 ENCOUNTER — Other Ambulatory Visit (HOSPITAL_COMMUNITY): Payer: Self-pay

## 2024-05-07 ENCOUNTER — Other Ambulatory Visit: Payer: Self-pay

## 2024-05-07 ENCOUNTER — Other Ambulatory Visit: Payer: Self-pay | Admitting: Internal Medicine

## 2024-05-07 MED ORDER — FUROSEMIDE 20 MG PO TABS
20.0000 mg | ORAL_TABLET | Freq: Every day | ORAL | 3 refills | Status: AC
  Filled 2024-05-07: qty 90, 90d supply, fill #0

## 2024-05-07 MED ORDER — LOSARTAN POTASSIUM 25 MG PO TABS
25.0000 mg | ORAL_TABLET | Freq: Every day | ORAL | 3 refills | Status: AC
  Filled 2024-05-07: qty 90, 90d supply, fill #0

## 2024-05-07 MED ORDER — ROSUVASTATIN CALCIUM 20 MG PO TABS
20.0000 mg | ORAL_TABLET | Freq: Every day | ORAL | 3 refills | Status: AC
  Filled 2024-05-07: qty 90, 90d supply, fill #0

## 2024-05-18 ENCOUNTER — Other Ambulatory Visit (HOSPITAL_COMMUNITY): Payer: Self-pay

## 2024-05-22 ENCOUNTER — Other Ambulatory Visit: Payer: Self-pay

## 2024-05-22 ENCOUNTER — Other Ambulatory Visit (HOSPITAL_COMMUNITY): Payer: Self-pay

## 2024-05-22 ENCOUNTER — Other Ambulatory Visit: Payer: Self-pay | Admitting: Internal Medicine

## 2024-05-22 MED ORDER — AMLODIPINE BESYLATE 2.5 MG PO TABS
7.5000 mg | ORAL_TABLET | Freq: Every day | ORAL | 3 refills | Status: AC
  Filled 2024-05-22: qty 90, 30d supply, fill #0

## 2024-05-24 ENCOUNTER — Other Ambulatory Visit (HOSPITAL_COMMUNITY): Payer: Self-pay

## 2024-05-28 ENCOUNTER — Other Ambulatory Visit: Payer: Self-pay

## 2024-05-28 ENCOUNTER — Other Ambulatory Visit (HOSPITAL_COMMUNITY): Payer: Self-pay

## 2024-05-28 ENCOUNTER — Other Ambulatory Visit: Payer: Self-pay | Admitting: Internal Medicine

## 2024-05-28 MED ORDER — POTASSIUM CHLORIDE CRYS ER 20 MEQ PO TBCR
20.0000 meq | EXTENDED_RELEASE_TABLET | Freq: Every day | ORAL | 3 refills | Status: AC
  Filled 2024-05-28: qty 90, 90d supply, fill #0

## 2024-05-31 ENCOUNTER — Other Ambulatory Visit (HOSPITAL_COMMUNITY): Payer: Self-pay

## 2024-11-13 ENCOUNTER — Ambulatory Visit

## 2024-11-19 ENCOUNTER — Encounter: Admitting: Family Medicine
